# Patient Record
Sex: Female | Born: 1935 | Race: White | Hispanic: No | Marital: Married | Smoking: Former smoker
Health system: Southern US, Community
[De-identification: ages and names within clinical notes are randomized; demographics above are authoritative.]

## PROBLEM LIST (undated history)

## (undated) DIAGNOSIS — I890 Lymphedema, not elsewhere classified: Secondary | ICD-10-CM

## (undated) DIAGNOSIS — M1990 Unspecified osteoarthritis, unspecified site: Secondary | ICD-10-CM

## (undated) DIAGNOSIS — D649 Anemia, unspecified: Secondary | ICD-10-CM

## (undated) DIAGNOSIS — R32 Unspecified urinary incontinence: Secondary | ICD-10-CM

## (undated) DIAGNOSIS — I739 Peripheral vascular disease, unspecified: Secondary | ICD-10-CM

## (undated) DIAGNOSIS — K449 Diaphragmatic hernia without obstruction or gangrene: Secondary | ICD-10-CM

## (undated) DIAGNOSIS — T7840XA Allergy, unspecified, initial encounter: Secondary | ICD-10-CM

## (undated) DIAGNOSIS — E039 Hypothyroidism, unspecified: Secondary | ICD-10-CM

## (undated) DIAGNOSIS — I8390 Asymptomatic varicose veins of unspecified lower extremity: Secondary | ICD-10-CM

## (undated) DIAGNOSIS — L97809 Non-pressure chronic ulcer of other part of unspecified lower leg with unspecified severity: Secondary | ICD-10-CM

## (undated) DIAGNOSIS — N182 Chronic kidney disease, stage 2 (mild): Secondary | ICD-10-CM

## (undated) DIAGNOSIS — I10 Essential (primary) hypertension: Secondary | ICD-10-CM

## (undated) DIAGNOSIS — I872 Venous insufficiency (chronic) (peripheral): Secondary | ICD-10-CM

## (undated) DIAGNOSIS — M797 Fibromyalgia: Secondary | ICD-10-CM

## (undated) DIAGNOSIS — K219 Gastro-esophageal reflux disease without esophagitis: Secondary | ICD-10-CM

## (undated) HISTORY — DX: Chronic kidney disease, stage 2 (mild): N18.2

## (undated) HISTORY — PX: COLONOSCOPY: SHX174

## (undated) HISTORY — DX: Lymphedema, not elsewhere classified: I89.0

## (undated) HISTORY — PX: TONSILLECTOMY: SUR1361

## (undated) HISTORY — PX: MULTIPLE TOOTH EXTRACTIONS: SHX2053

## (undated) HISTORY — DX: Unspecified osteoarthritis, unspecified site: M19.90

## (undated) HISTORY — DX: Diaphragmatic hernia without obstruction or gangrene: K44.9

## (undated) HISTORY — DX: Essential (primary) hypertension: I10

## (undated) HISTORY — DX: Allergy, unspecified, initial encounter: T78.40XA

## (undated) HISTORY — DX: Anemia, unspecified: D64.9

## (undated) HISTORY — PX: CATARACT EXTRACTION W/ INTRAOCULAR LENS  IMPLANT, BILATERAL: SHX1307

## (undated) HISTORY — DX: Hypothyroidism, unspecified: E03.9

## (undated) HISTORY — PX: KNEE ARTHROSCOPY: SUR90

## (undated) HISTORY — DX: Asymptomatic varicose veins of unspecified lower extremity: I83.90

---

## 1999-11-16 ENCOUNTER — Other Ambulatory Visit: Admission: RE | Admit: 1999-11-16 | Discharge: 1999-11-16 | Payer: Self-pay | Admitting: Internal Medicine

## 2000-12-25 ENCOUNTER — Ambulatory Visit (HOSPITAL_COMMUNITY): Admission: RE | Admit: 2000-12-25 | Discharge: 2000-12-25 | Payer: Self-pay | Admitting: *Deleted

## 2001-01-16 ENCOUNTER — Other Ambulatory Visit: Admission: RE | Admit: 2001-01-16 | Discharge: 2001-01-16 | Payer: Self-pay | Admitting: Internal Medicine

## 2004-02-14 ENCOUNTER — Other Ambulatory Visit: Admission: RE | Admit: 2004-02-14 | Discharge: 2004-02-14 | Payer: Self-pay | Admitting: Internal Medicine

## 2005-05-20 ENCOUNTER — Encounter: Admission: RE | Admit: 2005-05-20 | Discharge: 2005-05-20 | Payer: Self-pay | Admitting: Internal Medicine

## 2006-06-23 ENCOUNTER — Ambulatory Visit (HOSPITAL_BASED_OUTPATIENT_CLINIC_OR_DEPARTMENT_OTHER): Admission: RE | Admit: 2006-06-23 | Discharge: 2006-06-23 | Payer: Self-pay | Admitting: Otolaryngology

## 2006-06-23 ENCOUNTER — Encounter (INDEPENDENT_AMBULATORY_CARE_PROVIDER_SITE_OTHER): Payer: Self-pay | Admitting: Specialist

## 2007-06-05 ENCOUNTER — Other Ambulatory Visit: Admission: RE | Admit: 2007-06-05 | Discharge: 2007-06-05 | Payer: Self-pay | Admitting: Internal Medicine

## 2007-09-22 ENCOUNTER — Encounter: Admission: RE | Admit: 2007-09-22 | Discharge: 2007-09-22 | Payer: Self-pay | Admitting: Internal Medicine

## 2008-07-11 ENCOUNTER — Encounter: Admission: RE | Admit: 2008-07-11 | Discharge: 2008-07-11 | Payer: Self-pay | Admitting: Internal Medicine

## 2008-07-21 ENCOUNTER — Other Ambulatory Visit: Admission: RE | Admit: 2008-07-21 | Discharge: 2008-07-21 | Payer: Self-pay | Admitting: Interventional Radiology

## 2008-07-21 ENCOUNTER — Encounter: Admission: RE | Admit: 2008-07-21 | Discharge: 2008-07-21 | Payer: Self-pay | Admitting: Internal Medicine

## 2008-07-21 ENCOUNTER — Encounter (INDEPENDENT_AMBULATORY_CARE_PROVIDER_SITE_OTHER): Payer: Self-pay | Admitting: Interventional Radiology

## 2009-06-21 ENCOUNTER — Encounter: Admission: RE | Admit: 2009-06-21 | Discharge: 2009-06-21 | Payer: Self-pay | Admitting: Endocrinology

## 2010-06-18 ENCOUNTER — Encounter: Admission: RE | Admit: 2010-06-18 | Discharge: 2010-06-18 | Payer: Self-pay | Admitting: Endocrinology

## 2011-04-05 NOTE — Op Note (Signed)
NAMEMARIT, Shelby                 ACCOUNT NO.:  [id number]   MEDICAL RECORD NO.:  [id number]          PATIENT TYPE:  AMB   LOCATION:  DSC                          FACILITY:  [facility]   PHYSICIAN:  Jefry H. Pollyann Kennedy, MD     DATE OF BIRTH:  September 14, 1935   DATE OF PROCEDURE:  06/23/2006  DATE OF DISCHARGE:                                 OPERATIVE REPORT   PREOPERATIVE DIAGNOSIS:  Chronic ethmoid maxillary sinusitis.   POSTOPERATIVE DIAGNOSIS:  Chronic ethmoid maxillary sinusitis.   PROCEDURE:  1. Bilateral endoscopic total ethmoidectomy.  2. Bilateral endoscopic maxillary antrostomy.   SURGEON:  Jefry H. Pollyann Kennedy, MD   General endotracheal anesthesia was used.  No complications.  Blood loss  minimal.   FINDINGS:  Diffuse hyperplastic mucosa throughout the ethmoid cells with  obstruction of the frontal recess and the middle meatus area obstructing the  maxillary sinus ostium bilaterally.  Some polypoid changes found within the  frontal recess area.  No complications.  Blood loss minimal.   REFERRING PHYSICIAN:  Dr. Merri Brunette.   HISTORY:  A 76 year old lady with a history of chronic and recurring ethmoid  maxillary sinus sinusitis.  Risks, benefits, alternatives, complications of  procedure explained to the patient's who seemed to understand and agreed to  surgery.   PROCEDURE:  The patient was taken to the operating room and placed on the  operating table in supine position.  Following induction of general  endotracheal anesthesia, the patient was prepped and draped in standard  fashion.  Oxymetazoline spray was used preoperatively in nasal cavities.  1%  Xylocaine with epinephrine was infiltrated into the superior and posterior  attachments of the middle turbinate and lateral nasal wall bilaterally.  Afrin soaked pledgets were used for several minutes for additional  vasoconstriction.   1 - Bilateral total endoscopic ethmoidectomy.  The sickle knife was used to  incise the base  of the uncinate process starting on left side.  Uncinectomy  was performed with straight Wilde forceps.  The suction was used to enter  the bulla and a complete ethmoid dissection was accomplished using the  microdebrider, removing all of hyperplastic mucosa.  All bony septations  laterally to the lamina papyracea, superiorly to the fovea and posteriorly  through the ground lamella to the face of the sphenoid.  Complete ethmoid  dissection was accomplished.  The frontal recess was dissected of polypoid  tissue as well.  1. Bilateral endoscopic maxillary antrostomy.  After the bulla was opened      on each side.  The 30 degrees endoscope and the curved suction was used      to enter into the fontanelle into the maxillary sinus.  The backbiting      forceps was used to enlarge the ostium anteriorly and through cut      forceps were used to enlarge it      posteriorly.  The sinus itself was in good shape without any polyps or      other abnormalities.  The pharynx was suctioned of blood and      secretions.  The ethmoid cavities were packed with Kyung Rudd packs      bilaterally.  The patient was awakened from anesthesia, extubated,      transferred to recovery in stable condition.      Jefry H. Pollyann Kennedy, MD  Electronically Signed     JHR/MEDQ  D:  06/23/2006  T:  06/23/2006  Job:  [id number]   cc:   Soyla Murphy. Renne Crigler, M.D.

## 2011-04-05 NOTE — Procedures (Signed)
[facility]  Patient:    Shelby Reese, Shelby Reese                          MRN: [id number] Proc. Date: 12/25/00 Adm. Date:  [id number] Attending:  Sabino Gasser                           Procedure Report  PROCEDURE:  Colonoscopy.  INDICATION FOR PROCEDURE:  Hemoccult positivity.  ANESTHESIA:  Demerol 100 mg, Versed 10 mg.  DESCRIPTION OF PROCEDURE:  With the patient mildly sedated in the left lateral decubitus position, a rectal exam was performed which revealed trace positive material. Subsequently, the Olympus videoscopic colonoscope was inserted in the rectum and passed through a very tortuous colon with pressure applied to the abdomen. The patient rolled into various positions. We were able to reach the cecum identified by the ileocecal valve and appendiceal orifice both of which were photographed. From this point, the colonoscope was slowly withdrawn taking circumferential views of the entire colonic mucosa, stopping only in anal canal which showed hemorrhoids both from this view and on retroflexed view. The colonoscope was then straightened and withdrawn. The patients vital signs and pulse oximeter remained stable. The patient tolerated the procedure well without apparent complications.  FINDINGS:  Very tortuous colon but unremarkable examination other than hemorrhoids.  PLAN:  Follow-up with me on an as needed basis. DD:  12/25/00 TD:  12/26/00 Job: [id number]

## 2011-11-20 DIAGNOSIS — J309 Allergic rhinitis, unspecified: Secondary | ICD-10-CM | POA: Diagnosis not present

## 2011-11-27 DIAGNOSIS — J309 Allergic rhinitis, unspecified: Secondary | ICD-10-CM | POA: Diagnosis not present

## 2011-11-28 DIAGNOSIS — J309 Allergic rhinitis, unspecified: Secondary | ICD-10-CM | POA: Diagnosis not present

## 2011-12-02 DIAGNOSIS — J309 Allergic rhinitis, unspecified: Secondary | ICD-10-CM | POA: Diagnosis not present

## 2011-12-10 DIAGNOSIS — J309 Allergic rhinitis, unspecified: Secondary | ICD-10-CM | POA: Diagnosis not present

## 2011-12-14 ENCOUNTER — Ambulatory Visit (INDEPENDENT_AMBULATORY_CARE_PROVIDER_SITE_OTHER): Payer: Medicare Other

## 2011-12-14 DIAGNOSIS — Z888 Allergy status to other drugs, medicaments and biological substances status: Secondary | ICD-10-CM

## 2011-12-23 DIAGNOSIS — J309 Allergic rhinitis, unspecified: Secondary | ICD-10-CM | POA: Diagnosis not present

## 2011-12-24 DIAGNOSIS — B351 Tinea unguium: Secondary | ICD-10-CM | POA: Diagnosis not present

## 2011-12-24 DIAGNOSIS — M79609 Pain in unspecified limb: Secondary | ICD-10-CM | POA: Diagnosis not present

## 2011-12-24 DIAGNOSIS — M2040 Other hammer toe(s) (acquired), unspecified foot: Secondary | ICD-10-CM | POA: Diagnosis not present

## 2011-12-24 DIAGNOSIS — M7150 Other bursitis, not elsewhere classified, unspecified site: Secondary | ICD-10-CM | POA: Diagnosis not present

## 2011-12-31 DIAGNOSIS — J309 Allergic rhinitis, unspecified: Secondary | ICD-10-CM | POA: Diagnosis not present

## 2012-01-06 DIAGNOSIS — N3020 Other chronic cystitis without hematuria: Secondary | ICD-10-CM | POA: Diagnosis not present

## 2012-01-06 DIAGNOSIS — N3941 Urge incontinence: Secondary | ICD-10-CM | POA: Diagnosis not present

## 2012-01-09 DIAGNOSIS — J309 Allergic rhinitis, unspecified: Secondary | ICD-10-CM | POA: Diagnosis not present

## 2012-01-16 DIAGNOSIS — J309 Allergic rhinitis, unspecified: Secondary | ICD-10-CM | POA: Diagnosis not present

## 2012-01-20 DIAGNOSIS — J309 Allergic rhinitis, unspecified: Secondary | ICD-10-CM | POA: Diagnosis not present

## 2012-01-20 DIAGNOSIS — Z1231 Encounter for screening mammogram for malignant neoplasm of breast: Secondary | ICD-10-CM | POA: Diagnosis not present

## 2012-01-21 DIAGNOSIS — N289 Disorder of kidney and ureter, unspecified: Secondary | ICD-10-CM | POA: Diagnosis not present

## 2012-01-21 DIAGNOSIS — I10 Essential (primary) hypertension: Secondary | ICD-10-CM | POA: Diagnosis not present

## 2012-01-21 DIAGNOSIS — E039 Hypothyroidism, unspecified: Secondary | ICD-10-CM | POA: Diagnosis not present

## 2012-01-21 DIAGNOSIS — N390 Urinary tract infection, site not specified: Secondary | ICD-10-CM | POA: Diagnosis not present

## 2012-01-28 DIAGNOSIS — R5383 Other fatigue: Secondary | ICD-10-CM | POA: Diagnosis not present

## 2012-01-28 DIAGNOSIS — Z8739 Personal history of other diseases of the musculoskeletal system and connective tissue: Secondary | ICD-10-CM | POA: Diagnosis not present

## 2012-01-28 DIAGNOSIS — I890 Lymphedema, not elsewhere classified: Secondary | ICD-10-CM | POA: Diagnosis not present

## 2012-01-28 DIAGNOSIS — E039 Hypothyroidism, unspecified: Secondary | ICD-10-CM | POA: Diagnosis not present

## 2012-01-28 DIAGNOSIS — R5381 Other malaise: Secondary | ICD-10-CM | POA: Diagnosis not present

## 2012-01-31 DIAGNOSIS — J309 Allergic rhinitis, unspecified: Secondary | ICD-10-CM | POA: Diagnosis not present

## 2012-02-04 DIAGNOSIS — J309 Allergic rhinitis, unspecified: Secondary | ICD-10-CM | POA: Diagnosis not present

## 2012-02-12 DIAGNOSIS — Z1212 Encounter for screening for malignant neoplasm of rectum: Secondary | ICD-10-CM | POA: Diagnosis not present

## 2012-02-19 DIAGNOSIS — J309 Allergic rhinitis, unspecified: Secondary | ICD-10-CM | POA: Diagnosis not present

## 2012-02-20 DIAGNOSIS — J309 Allergic rhinitis, unspecified: Secondary | ICD-10-CM | POA: Diagnosis not present

## 2012-02-26 DIAGNOSIS — J309 Allergic rhinitis, unspecified: Secondary | ICD-10-CM | POA: Diagnosis not present

## 2012-03-04 DIAGNOSIS — J309 Allergic rhinitis, unspecified: Secondary | ICD-10-CM | POA: Diagnosis not present

## 2012-03-05 DIAGNOSIS — E039 Hypothyroidism, unspecified: Secondary | ICD-10-CM | POA: Diagnosis not present

## 2012-03-10 DIAGNOSIS — E039 Hypothyroidism, unspecified: Secondary | ICD-10-CM | POA: Diagnosis not present

## 2012-03-11 DIAGNOSIS — J309 Allergic rhinitis, unspecified: Secondary | ICD-10-CM | POA: Diagnosis not present

## 2012-03-18 DIAGNOSIS — J309 Allergic rhinitis, unspecified: Secondary | ICD-10-CM | POA: Diagnosis not present

## 2012-03-24 DIAGNOSIS — J309 Allergic rhinitis, unspecified: Secondary | ICD-10-CM | POA: Diagnosis not present

## 2012-03-24 DIAGNOSIS — M79609 Pain in unspecified limb: Secondary | ICD-10-CM | POA: Diagnosis not present

## 2012-03-24 DIAGNOSIS — B351 Tinea unguium: Secondary | ICD-10-CM | POA: Diagnosis not present

## 2012-03-31 DIAGNOSIS — J309 Allergic rhinitis, unspecified: Secondary | ICD-10-CM | POA: Diagnosis not present

## 2012-04-07 DIAGNOSIS — N3020 Other chronic cystitis without hematuria: Secondary | ICD-10-CM | POA: Diagnosis not present

## 2012-04-07 DIAGNOSIS — N3941 Urge incontinence: Secondary | ICD-10-CM | POA: Diagnosis not present

## 2012-04-08 DIAGNOSIS — J309 Allergic rhinitis, unspecified: Secondary | ICD-10-CM | POA: Diagnosis not present

## 2012-04-15 DIAGNOSIS — J309 Allergic rhinitis, unspecified: Secondary | ICD-10-CM | POA: Diagnosis not present

## 2012-04-22 DIAGNOSIS — J309 Allergic rhinitis, unspecified: Secondary | ICD-10-CM | POA: Diagnosis not present

## 2012-04-29 DIAGNOSIS — J309 Allergic rhinitis, unspecified: Secondary | ICD-10-CM | POA: Diagnosis not present

## 2012-05-05 DIAGNOSIS — J309 Allergic rhinitis, unspecified: Secondary | ICD-10-CM | POA: Diagnosis not present

## 2012-05-05 DIAGNOSIS — E039 Hypothyroidism, unspecified: Secondary | ICD-10-CM | POA: Diagnosis not present

## 2012-05-08 DIAGNOSIS — J309 Allergic rhinitis, unspecified: Secondary | ICD-10-CM | POA: Diagnosis not present

## 2012-05-11 DIAGNOSIS — J309 Allergic rhinitis, unspecified: Secondary | ICD-10-CM | POA: Diagnosis not present

## 2012-05-11 DIAGNOSIS — E039 Hypothyroidism, unspecified: Secondary | ICD-10-CM | POA: Diagnosis not present

## 2012-05-14 DIAGNOSIS — J309 Allergic rhinitis, unspecified: Secondary | ICD-10-CM | POA: Diagnosis not present

## 2012-05-20 DIAGNOSIS — J309 Allergic rhinitis, unspecified: Secondary | ICD-10-CM | POA: Diagnosis not present

## 2012-05-27 DIAGNOSIS — J309 Allergic rhinitis, unspecified: Secondary | ICD-10-CM | POA: Diagnosis not present

## 2012-06-03 DIAGNOSIS — J309 Allergic rhinitis, unspecified: Secondary | ICD-10-CM | POA: Diagnosis not present

## 2012-06-10 DIAGNOSIS — J309 Allergic rhinitis, unspecified: Secondary | ICD-10-CM | POA: Diagnosis not present

## 2012-06-18 DIAGNOSIS — J309 Allergic rhinitis, unspecified: Secondary | ICD-10-CM | POA: Diagnosis not present

## 2012-06-25 DIAGNOSIS — J309 Allergic rhinitis, unspecified: Secondary | ICD-10-CM | POA: Diagnosis not present

## 2012-06-30 DIAGNOSIS — M79609 Pain in unspecified limb: Secondary | ICD-10-CM | POA: Diagnosis not present

## 2012-06-30 DIAGNOSIS — J309 Allergic rhinitis, unspecified: Secondary | ICD-10-CM | POA: Diagnosis not present

## 2012-06-30 DIAGNOSIS — B351 Tinea unguium: Secondary | ICD-10-CM | POA: Diagnosis not present

## 2012-07-08 DIAGNOSIS — J309 Allergic rhinitis, unspecified: Secondary | ICD-10-CM | POA: Diagnosis not present

## 2012-07-15 DIAGNOSIS — J309 Allergic rhinitis, unspecified: Secondary | ICD-10-CM | POA: Diagnosis not present

## 2012-07-22 DIAGNOSIS — J309 Allergic rhinitis, unspecified: Secondary | ICD-10-CM | POA: Diagnosis not present

## 2012-07-29 DIAGNOSIS — J309 Allergic rhinitis, unspecified: Secondary | ICD-10-CM | POA: Diagnosis not present

## 2012-07-31 DIAGNOSIS — J309 Allergic rhinitis, unspecified: Secondary | ICD-10-CM | POA: Diagnosis not present

## 2012-08-03 DIAGNOSIS — J309 Allergic rhinitis, unspecified: Secondary | ICD-10-CM | POA: Diagnosis not present

## 2012-08-04 DIAGNOSIS — J309 Allergic rhinitis, unspecified: Secondary | ICD-10-CM | POA: Diagnosis not present

## 2012-08-10 DIAGNOSIS — R05 Cough: Secondary | ICD-10-CM | POA: Diagnosis not present

## 2012-08-18 DIAGNOSIS — J309 Allergic rhinitis, unspecified: Secondary | ICD-10-CM | POA: Diagnosis not present

## 2012-08-20 DIAGNOSIS — Z961 Presence of intraocular lens: Secondary | ICD-10-CM | POA: Diagnosis not present

## 2012-08-27 DIAGNOSIS — J309 Allergic rhinitis, unspecified: Secondary | ICD-10-CM | POA: Diagnosis not present

## 2012-09-01 DIAGNOSIS — J309 Allergic rhinitis, unspecified: Secondary | ICD-10-CM | POA: Diagnosis not present

## 2012-09-03 DIAGNOSIS — Z23 Encounter for immunization: Secondary | ICD-10-CM | POA: Diagnosis not present

## 2012-09-10 DIAGNOSIS — J309 Allergic rhinitis, unspecified: Secondary | ICD-10-CM | POA: Diagnosis not present

## 2012-09-17 DIAGNOSIS — J309 Allergic rhinitis, unspecified: Secondary | ICD-10-CM | POA: Diagnosis not present

## 2012-09-21 DIAGNOSIS — J309 Allergic rhinitis, unspecified: Secondary | ICD-10-CM | POA: Diagnosis not present

## 2012-09-22 DIAGNOSIS — M79609 Pain in unspecified limb: Secondary | ICD-10-CM | POA: Diagnosis not present

## 2012-09-22 DIAGNOSIS — B351 Tinea unguium: Secondary | ICD-10-CM | POA: Diagnosis not present

## 2012-09-23 DIAGNOSIS — I83893 Varicose veins of bilateral lower extremities with other complications: Secondary | ICD-10-CM | POA: Diagnosis not present

## 2012-09-23 DIAGNOSIS — L97909 Non-pressure chronic ulcer of unspecified part of unspecified lower leg with unspecified severity: Secondary | ICD-10-CM | POA: Diagnosis not present

## 2012-09-25 DIAGNOSIS — M7989 Other specified soft tissue disorders: Secondary | ICD-10-CM | POA: Diagnosis not present

## 2012-09-30 DIAGNOSIS — J309 Allergic rhinitis, unspecified: Secondary | ICD-10-CM | POA: Diagnosis not present

## 2012-10-01 DIAGNOSIS — J309 Allergic rhinitis, unspecified: Secondary | ICD-10-CM | POA: Diagnosis not present

## 2012-10-02 DIAGNOSIS — J309 Allergic rhinitis, unspecified: Secondary | ICD-10-CM | POA: Diagnosis not present

## 2012-10-06 DIAGNOSIS — N3941 Urge incontinence: Secondary | ICD-10-CM | POA: Diagnosis not present

## 2012-10-07 DIAGNOSIS — J309 Allergic rhinitis, unspecified: Secondary | ICD-10-CM | POA: Diagnosis not present

## 2012-10-09 DIAGNOSIS — I872 Venous insufficiency (chronic) (peripheral): Secondary | ICD-10-CM | POA: Diagnosis not present

## 2012-10-12 DIAGNOSIS — J309 Allergic rhinitis, unspecified: Secondary | ICD-10-CM | POA: Diagnosis not present

## 2012-10-22 DIAGNOSIS — J309 Allergic rhinitis, unspecified: Secondary | ICD-10-CM | POA: Diagnosis not present

## 2012-10-28 DIAGNOSIS — M19049 Primary osteoarthritis, unspecified hand: Secondary | ICD-10-CM | POA: Diagnosis not present

## 2012-10-28 DIAGNOSIS — J309 Allergic rhinitis, unspecified: Secondary | ICD-10-CM | POA: Diagnosis not present

## 2012-11-05 DIAGNOSIS — J309 Allergic rhinitis, unspecified: Secondary | ICD-10-CM | POA: Diagnosis not present

## 2012-11-12 DIAGNOSIS — J309 Allergic rhinitis, unspecified: Secondary | ICD-10-CM | POA: Diagnosis not present

## 2012-11-19 ENCOUNTER — Other Ambulatory Visit: Payer: Self-pay | Admitting: Internal Medicine

## 2012-11-19 ENCOUNTER — Telehealth: Payer: Self-pay | Admitting: Emergency Medicine

## 2012-11-19 DIAGNOSIS — I872 Venous insufficiency (chronic) (peripheral): Secondary | ICD-10-CM

## 2012-11-19 NOTE — Telephone Encounter (Signed)
RECEIVED U/S REPORT FROM [facility] OFFICE.  THEY CAN NOT BURN U/S IMAGES TO A CD.  PT REFUSES TO HAVE ANOTHER US PERFORMED BECAUSE HER LEGS HURT TOO MUCH.  I TOLD HER THAT I WOULD CALL DR PHARR OFFICE TO MAKE THEM AWARE.   2:13- S/W SALLY AT DR [facility] OFFICE TO MAKE HER AWARE THAT PT REFUSES TO HAVE ANOTHER U/S ON HER LEGS. SHE WILL MAKE DR [facility] AWARE.

## 2012-11-24 DIAGNOSIS — J309 Allergic rhinitis, unspecified: Secondary | ICD-10-CM | POA: Diagnosis not present

## 2012-12-01 DIAGNOSIS — L98499 Non-pressure chronic ulcer of skin of other sites with unspecified severity: Secondary | ICD-10-CM | POA: Diagnosis not present

## 2012-12-01 DIAGNOSIS — R609 Edema, unspecified: Secondary | ICD-10-CM | POA: Diagnosis not present

## 2012-12-02 ENCOUNTER — Other Ambulatory Visit: Payer: Self-pay | Admitting: Internal Medicine

## 2012-12-02 DIAGNOSIS — I83819 Varicose veins of unspecified lower extremities with pain: Secondary | ICD-10-CM

## 2012-12-09 DIAGNOSIS — J309 Allergic rhinitis, unspecified: Secondary | ICD-10-CM | POA: Diagnosis not present

## 2012-12-15 DIAGNOSIS — B351 Tinea unguium: Secondary | ICD-10-CM | POA: Diagnosis not present

## 2012-12-15 DIAGNOSIS — M79609 Pain in unspecified limb: Secondary | ICD-10-CM | POA: Diagnosis not present

## 2012-12-16 ENCOUNTER — Inpatient Hospital Stay: Admission: RE | Admit: 2012-12-16 | Payer: Self-pay | Source: Ambulatory Visit

## 2012-12-16 ENCOUNTER — Other Ambulatory Visit: Payer: Self-pay

## 2012-12-17 DIAGNOSIS — J309 Allergic rhinitis, unspecified: Secondary | ICD-10-CM | POA: Diagnosis not present

## 2012-12-18 DIAGNOSIS — J309 Allergic rhinitis, unspecified: Secondary | ICD-10-CM | POA: Diagnosis not present

## 2012-12-24 DIAGNOSIS — J309 Allergic rhinitis, unspecified: Secondary | ICD-10-CM | POA: Diagnosis not present

## 2013-01-05 ENCOUNTER — Ambulatory Visit
Admission: RE | Admit: 2013-01-05 | Discharge: 2013-01-05 | Disposition: A | Discharge status: Home / Self Care | Payer: Medicare Other | Source: Ambulatory Visit | Attending: Internal Medicine | Admitting: Internal Medicine

## 2013-01-05 DIAGNOSIS — I83819 Varicose veins of unspecified lower extremities with pain: Secondary | ICD-10-CM

## 2013-01-05 DIAGNOSIS — I872 Venous insufficiency (chronic) (peripheral): Secondary | ICD-10-CM | POA: Diagnosis not present

## 2013-01-06 DIAGNOSIS — N3941 Urge incontinence: Secondary | ICD-10-CM | POA: Diagnosis not present

## 2013-01-06 DIAGNOSIS — J309 Allergic rhinitis, unspecified: Secondary | ICD-10-CM | POA: Diagnosis not present

## 2013-01-07 DIAGNOSIS — I890 Lymphedema, not elsewhere classified: Secondary | ICD-10-CM | POA: Diagnosis not present

## 2013-01-07 DIAGNOSIS — M1990 Unspecified osteoarthritis, unspecified site: Secondary | ICD-10-CM | POA: Diagnosis not present

## 2013-01-07 DIAGNOSIS — I83009 Varicose veins of unspecified lower extremity with ulcer of unspecified site: Secondary | ICD-10-CM | POA: Diagnosis not present

## 2013-01-11 DIAGNOSIS — E669 Obesity, unspecified: Secondary | ICD-10-CM | POA: Diagnosis not present

## 2013-01-11 DIAGNOSIS — I890 Lymphedema, not elsewhere classified: Secondary | ICD-10-CM | POA: Diagnosis not present

## 2013-01-11 DIAGNOSIS — I83009 Varicose veins of unspecified lower extremity with ulcer of unspecified site: Secondary | ICD-10-CM | POA: Diagnosis not present

## 2013-01-19 DIAGNOSIS — L97909 Non-pressure chronic ulcer of unspecified part of unspecified lower leg with unspecified severity: Secondary | ICD-10-CM | POA: Diagnosis not present

## 2013-01-19 DIAGNOSIS — L97309 Non-pressure chronic ulcer of unspecified ankle with unspecified severity: Secondary | ICD-10-CM | POA: Diagnosis not present

## 2013-01-19 DIAGNOSIS — I83009 Varicose veins of unspecified lower extremity with ulcer of unspecified site: Secondary | ICD-10-CM | POA: Diagnosis not present

## 2013-01-19 DIAGNOSIS — M1990 Unspecified osteoarthritis, unspecified site: Secondary | ICD-10-CM | POA: Diagnosis not present

## 2013-01-25 DIAGNOSIS — L97309 Non-pressure chronic ulcer of unspecified ankle with unspecified severity: Secondary | ICD-10-CM | POA: Diagnosis not present

## 2013-01-25 DIAGNOSIS — I10 Essential (primary) hypertension: Secondary | ICD-10-CM | POA: Diagnosis not present

## 2013-01-25 DIAGNOSIS — N3020 Other chronic cystitis without hematuria: Secondary | ICD-10-CM | POA: Diagnosis not present

## 2013-01-28 DIAGNOSIS — Z0000 Encounter for general adult medical examination without abnormal findings: Secondary | ICD-10-CM | POA: Diagnosis not present

## 2013-02-08 DIAGNOSIS — I890 Lymphedema, not elsewhere classified: Secondary | ICD-10-CM | POA: Diagnosis not present

## 2013-02-08 DIAGNOSIS — L97909 Non-pressure chronic ulcer of unspecified part of unspecified lower leg with unspecified severity: Secondary | ICD-10-CM | POA: Diagnosis not present

## 2013-02-08 DIAGNOSIS — L97309 Non-pressure chronic ulcer of unspecified ankle with unspecified severity: Secondary | ICD-10-CM | POA: Diagnosis not present

## 2013-02-08 DIAGNOSIS — I10 Essential (primary) hypertension: Secondary | ICD-10-CM | POA: Diagnosis not present

## 2013-02-08 DIAGNOSIS — M1990 Unspecified osteoarthritis, unspecified site: Secondary | ICD-10-CM | POA: Diagnosis not present

## 2013-02-08 DIAGNOSIS — E669 Obesity, unspecified: Secondary | ICD-10-CM | POA: Diagnosis not present

## 2013-02-11 DIAGNOSIS — J309 Allergic rhinitis, unspecified: Secondary | ICD-10-CM | POA: Diagnosis not present

## 2013-02-15 DIAGNOSIS — M1990 Unspecified osteoarthritis, unspecified site: Secondary | ICD-10-CM | POA: Diagnosis not present

## 2013-02-15 DIAGNOSIS — L97309 Non-pressure chronic ulcer of unspecified ankle with unspecified severity: Secondary | ICD-10-CM | POA: Diagnosis not present

## 2013-02-15 DIAGNOSIS — I890 Lymphedema, not elsewhere classified: Secondary | ICD-10-CM | POA: Diagnosis not present

## 2013-02-15 DIAGNOSIS — J309 Allergic rhinitis, unspecified: Secondary | ICD-10-CM | POA: Diagnosis not present

## 2013-02-15 DIAGNOSIS — L89109 Pressure ulcer of unspecified part of back, unspecified stage: Secondary | ICD-10-CM | POA: Diagnosis not present

## 2013-02-15 DIAGNOSIS — E669 Obesity, unspecified: Secondary | ICD-10-CM | POA: Diagnosis not present

## 2013-02-15 DIAGNOSIS — I10 Essential (primary) hypertension: Secondary | ICD-10-CM | POA: Diagnosis not present

## 2013-02-15 DIAGNOSIS — L97909 Non-pressure chronic ulcer of unspecified part of unspecified lower leg with unspecified severity: Secondary | ICD-10-CM | POA: Diagnosis not present

## 2013-02-15 DIAGNOSIS — I83009 Varicose veins of unspecified lower extremity with ulcer of unspecified site: Secondary | ICD-10-CM | POA: Diagnosis not present

## 2013-02-22 DIAGNOSIS — E669 Obesity, unspecified: Secondary | ICD-10-CM | POA: Diagnosis not present

## 2013-02-22 DIAGNOSIS — M1990 Unspecified osteoarthritis, unspecified site: Secondary | ICD-10-CM | POA: Diagnosis not present

## 2013-02-22 DIAGNOSIS — L97309 Non-pressure chronic ulcer of unspecified ankle with unspecified severity: Secondary | ICD-10-CM | POA: Diagnosis not present

## 2013-02-22 DIAGNOSIS — L97909 Non-pressure chronic ulcer of unspecified part of unspecified lower leg with unspecified severity: Secondary | ICD-10-CM | POA: Diagnosis not present

## 2013-02-22 DIAGNOSIS — Z7982 Long term (current) use of aspirin: Secondary | ICD-10-CM | POA: Diagnosis not present

## 2013-02-22 DIAGNOSIS — I10 Essential (primary) hypertension: Secondary | ICD-10-CM | POA: Diagnosis not present

## 2013-02-22 DIAGNOSIS — I890 Lymphedema, not elsewhere classified: Secondary | ICD-10-CM | POA: Diagnosis not present

## 2013-02-23 DIAGNOSIS — Z1212 Encounter for screening for malignant neoplasm of rectum: Secondary | ICD-10-CM | POA: Diagnosis not present

## 2013-03-01 DIAGNOSIS — L97309 Non-pressure chronic ulcer of unspecified ankle with unspecified severity: Secondary | ICD-10-CM | POA: Diagnosis not present

## 2013-03-01 DIAGNOSIS — I10 Essential (primary) hypertension: Secondary | ICD-10-CM | POA: Diagnosis not present

## 2013-03-01 DIAGNOSIS — I890 Lymphedema, not elsewhere classified: Secondary | ICD-10-CM | POA: Diagnosis not present

## 2013-03-01 DIAGNOSIS — E669 Obesity, unspecified: Secondary | ICD-10-CM | POA: Diagnosis not present

## 2013-03-01 DIAGNOSIS — M1990 Unspecified osteoarthritis, unspecified site: Secondary | ICD-10-CM | POA: Diagnosis not present

## 2013-03-01 DIAGNOSIS — I83009 Varicose veins of unspecified lower extremity with ulcer of unspecified site: Secondary | ICD-10-CM | POA: Diagnosis not present

## 2013-03-02 DIAGNOSIS — R350 Frequency of micturition: Secondary | ICD-10-CM | POA: Diagnosis not present

## 2013-03-02 DIAGNOSIS — M545 Low back pain: Secondary | ICD-10-CM | POA: Diagnosis not present

## 2013-03-02 DIAGNOSIS — N390 Urinary tract infection, site not specified: Secondary | ICD-10-CM | POA: Diagnosis not present

## 2013-03-02 DIAGNOSIS — N189 Chronic kidney disease, unspecified: Secondary | ICD-10-CM | POA: Diagnosis not present

## 2013-03-04 DIAGNOSIS — J309 Allergic rhinitis, unspecified: Secondary | ICD-10-CM | POA: Diagnosis not present

## 2013-03-08 DIAGNOSIS — L97909 Non-pressure chronic ulcer of unspecified part of unspecified lower leg with unspecified severity: Secondary | ICD-10-CM | POA: Diagnosis not present

## 2013-03-08 DIAGNOSIS — M1990 Unspecified osteoarthritis, unspecified site: Secondary | ICD-10-CM | POA: Diagnosis not present

## 2013-03-08 DIAGNOSIS — I10 Essential (primary) hypertension: Secondary | ICD-10-CM | POA: Diagnosis not present

## 2013-03-08 DIAGNOSIS — L97309 Non-pressure chronic ulcer of unspecified ankle with unspecified severity: Secondary | ICD-10-CM | POA: Diagnosis not present

## 2013-03-08 DIAGNOSIS — E669 Obesity, unspecified: Secondary | ICD-10-CM | POA: Diagnosis not present

## 2013-03-08 DIAGNOSIS — I890 Lymphedema, not elsewhere classified: Secondary | ICD-10-CM | POA: Diagnosis not present

## 2013-03-08 DIAGNOSIS — I83009 Varicose veins of unspecified lower extremity with ulcer of unspecified site: Secondary | ICD-10-CM | POA: Diagnosis not present

## 2013-03-09 DIAGNOSIS — B351 Tinea unguium: Secondary | ICD-10-CM | POA: Diagnosis not present

## 2013-03-09 DIAGNOSIS — M79609 Pain in unspecified limb: Secondary | ICD-10-CM | POA: Diagnosis not present

## 2013-03-15 DIAGNOSIS — J309 Allergic rhinitis, unspecified: Secondary | ICD-10-CM | POA: Diagnosis not present

## 2013-03-16 DIAGNOSIS — E669 Obesity, unspecified: Secondary | ICD-10-CM | POA: Diagnosis not present

## 2013-03-16 DIAGNOSIS — I10 Essential (primary) hypertension: Secondary | ICD-10-CM | POA: Diagnosis not present

## 2013-03-16 DIAGNOSIS — L97909 Non-pressure chronic ulcer of unspecified part of unspecified lower leg with unspecified severity: Secondary | ICD-10-CM | POA: Diagnosis not present

## 2013-03-16 DIAGNOSIS — I890 Lymphedema, not elsewhere classified: Secondary | ICD-10-CM | POA: Diagnosis not present

## 2013-03-16 DIAGNOSIS — L97309 Non-pressure chronic ulcer of unspecified ankle with unspecified severity: Secondary | ICD-10-CM | POA: Diagnosis not present

## 2013-03-16 DIAGNOSIS — M1990 Unspecified osteoarthritis, unspecified site: Secondary | ICD-10-CM | POA: Diagnosis not present

## 2013-03-16 DIAGNOSIS — I83009 Varicose veins of unspecified lower extremity with ulcer of unspecified site: Secondary | ICD-10-CM | POA: Diagnosis not present

## 2013-03-23 DIAGNOSIS — D649 Anemia, unspecified: Secondary | ICD-10-CM | POA: Diagnosis not present

## 2013-03-24 ENCOUNTER — Telehealth: Payer: Self-pay | Admitting: Oncology

## 2013-03-24 DIAGNOSIS — M1990 Unspecified osteoarthritis, unspecified site: Secondary | ICD-10-CM | POA: Diagnosis not present

## 2013-03-24 DIAGNOSIS — I890 Lymphedema, not elsewhere classified: Secondary | ICD-10-CM | POA: Diagnosis not present

## 2013-03-24 DIAGNOSIS — I10 Essential (primary) hypertension: Secondary | ICD-10-CM | POA: Diagnosis not present

## 2013-03-24 DIAGNOSIS — I83009 Varicose veins of unspecified lower extremity with ulcer of unspecified site: Secondary | ICD-10-CM | POA: Diagnosis not present

## 2013-03-24 DIAGNOSIS — Z7982 Long term (current) use of aspirin: Secondary | ICD-10-CM | POA: Diagnosis not present

## 2013-03-24 DIAGNOSIS — L97309 Non-pressure chronic ulcer of unspecified ankle with unspecified severity: Secondary | ICD-10-CM | POA: Diagnosis not present

## 2013-03-24 DIAGNOSIS — E669 Obesity, unspecified: Secondary | ICD-10-CM | POA: Diagnosis not present

## 2013-03-24 NOTE — Telephone Encounter (Signed)
LVOM FOR PT TO RETURN CALL IN RE TO NP APPT.  °

## 2013-03-25 DIAGNOSIS — J309 Allergic rhinitis, unspecified: Secondary | ICD-10-CM | POA: Diagnosis not present

## 2013-03-25 NOTE — Telephone Encounter (Signed)
S/W PT IN RE TO NP APPT 05/29 W/DR. HA REFERRING DR. PHARR DX- ANEMIA WELCOME PACKET MAILED.

## 2013-03-26 ENCOUNTER — Telehealth: Payer: Self-pay | Admitting: Oncology

## 2013-03-26 NOTE — Telephone Encounter (Signed)
C/D 03/26/13 for appt. 04/15/13

## 2013-03-29 DIAGNOSIS — E669 Obesity, unspecified: Secondary | ICD-10-CM | POA: Diagnosis not present

## 2013-03-29 DIAGNOSIS — L97209 Non-pressure chronic ulcer of unspecified calf with unspecified severity: Secondary | ICD-10-CM | POA: Diagnosis not present

## 2013-03-29 DIAGNOSIS — I83009 Varicose veins of unspecified lower extremity with ulcer of unspecified site: Secondary | ICD-10-CM | POA: Diagnosis not present

## 2013-03-29 DIAGNOSIS — L97909 Non-pressure chronic ulcer of unspecified part of unspecified lower leg with unspecified severity: Secondary | ICD-10-CM | POA: Diagnosis not present

## 2013-04-05 DIAGNOSIS — I83009 Varicose veins of unspecified lower extremity with ulcer of unspecified site: Secondary | ICD-10-CM | POA: Diagnosis not present

## 2013-04-05 DIAGNOSIS — L97209 Non-pressure chronic ulcer of unspecified calf with unspecified severity: Secondary | ICD-10-CM | POA: Diagnosis not present

## 2013-04-05 DIAGNOSIS — L97909 Non-pressure chronic ulcer of unspecified part of unspecified lower leg with unspecified severity: Secondary | ICD-10-CM | POA: Diagnosis not present

## 2013-04-05 DIAGNOSIS — E669 Obesity, unspecified: Secondary | ICD-10-CM | POA: Diagnosis not present

## 2013-04-07 DIAGNOSIS — J309 Allergic rhinitis, unspecified: Secondary | ICD-10-CM | POA: Diagnosis not present

## 2013-04-08 DIAGNOSIS — J309 Allergic rhinitis, unspecified: Secondary | ICD-10-CM | POA: Diagnosis not present

## 2013-04-15 ENCOUNTER — Ambulatory Visit (HOSPITAL_BASED_OUTPATIENT_CLINIC_OR_DEPARTMENT_OTHER): Payer: Medicare Other | Admitting: Oncology

## 2013-04-15 ENCOUNTER — Encounter: Payer: Self-pay | Admitting: Oncology

## 2013-04-15 ENCOUNTER — Telehealth: Payer: Self-pay | Admitting: Oncology

## 2013-04-15 ENCOUNTER — Other Ambulatory Visit (HOSPITAL_BASED_OUTPATIENT_CLINIC_OR_DEPARTMENT_OTHER): Payer: Medicare Other | Admitting: Lab

## 2013-04-15 ENCOUNTER — Ambulatory Visit: Payer: Medicare Other

## 2013-04-15 VITALS — BP 183/79 | HR 98 | Temp 97.1°F | Resp 19 | Ht 63.0 in | Wt 241.7 lb

## 2013-04-15 DIAGNOSIS — D539 Nutritional anemia, unspecified: Secondary | ICD-10-CM

## 2013-04-15 LAB — CBC WITH DIFFERENTIAL/PLATELET
Basophils Absolute: 0 10*3/uL (ref 0.0–0.1)
EOS%: 5.7 % (ref 0.0–7.0)
Eosinophils Absolute: 0.5 10*3/uL (ref 0.0–0.5)
HGB: 11.9 g/dL (ref 11.6–15.9)
MCV: 89.6 fL (ref 79.5–101.0)
RBC: 4.32 10*6/uL (ref 3.70–5.45)
RDW: 15.1 % — ABNORMAL HIGH (ref 11.2–14.5)
WBC: 9.5 10*3/uL (ref 3.9–10.3)

## 2013-04-15 LAB — COMPREHENSIVE METABOLIC PANEL (CC13)
ALT: 15 U/L (ref 0–55)
AST: 17 U/L (ref 5–34)
Alkaline Phosphatase: 73 U/L (ref 40–150)
Creatinine: 1.4 mg/dL — ABNORMAL HIGH (ref 0.6–1.1)
Potassium: 4.3 mEq/L (ref 3.5–5.1)
Sodium: 140 mEq/L (ref 136–145)

## 2013-04-15 LAB — MORPHOLOGY: PLT EST: ADEQUATE

## 2013-04-15 NOTE — Telephone Encounter (Signed)
gv and printed appt sched and avs for pt  °

## 2013-04-15 NOTE — Progress Notes (Signed)
No financial issues. I checked in new patient.

## 2013-04-15 NOTE — Patient Instructions (Addendum)
1.  Issue:  Anemia.  2.  Most likely anemia of chronic kidney disease. I need to rule out iron deficiency, Vit B12 deficiency, myeloma. 3.  I have low clinical suspicion at this time for MDS (myelodysplastic syndrome) due to very mild anemia.  If this were to be MDS, the treatment would be observation anyway due to lack of severe cytopenia. 4.  Recommendation:  Lab and return visit in about 8 months.

## 2013-04-16 NOTE — Progress Notes (Signed)
[facility]  Telephone:([phone]) [phone] [phone]     INITIAL HEMATOLOGY CONSULTATION    Referral MD:  Merri Brunette, M.D.  Reason for Referral: normocytic anemia.     HPI: Ms. 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 She recently developed normocytic anemia.  On 03/23/2013, her WBC was 10.4; Hgb 11.1; Plt 302.  She thought that she had slight anemia from internal hemorrhoids.  Her latest iron panel from 01/25/13 showed iron 39 ug/dL (ref [id number]), iron biding capacity 368 ug/dL (ref 829-562).  She was advised to start oral iron anyway.  Her last screening colonoscopy was more than 10 years ago; however, she refused to have another one due to poor experience with colonoscopy in the past.  She was kindly referred to the Cancer for evaluation.  Ms. Fantini presented to the Cancer Center today for the first time bu herself.  She reported mild diffuse muscle pain at the pressure points.  She though that she had fibromyalgia.  She had a few days of constipation in April 2014 and had hematochezia without melena, hematemesis, nausea/vomiting, abdominal pain.  She assumed that she had hemorrhoid.  She has not had hematochezia since then.  She is independent of all activities of daily living. She denied fever, anorexia, weight loss, fatigue, headache, visual changes, confusion, drenching night sweats, palpable lymph node swelling, mucositis, odynophagia, dysphagia, nausea vomiting, jaundice, chest pain, palpitation, shortness of breath, dyspnea on exertion, productive cough, gum bleeding, epistaxis, hematemesis, hemoptysis, abdominal pain, abdominal swelling, early satiety, melena, hematuria, skin rash, spontaneous bleeding, heat or cold intolerance, bowel bladder incontinence, back pain, focal motor weakness, paresthesia, depression.      Past Medical History  Diagnosis Date  . Hypothyroid   . Lymphedema     venous insufficency  .  Osteoarthritis   . Hypertension   . Allergy   . CKD (chronic kidney disease), stage II   . Hiatal hernia   :    Past Surgical History  Procedure Laterality Date  . Tonsillectomy    :   CURRENT MEDS: Current Outpatient Prescriptions  Medication Sig Dispense Refill  . acetaminophen (TYLENOL) 500 MG tablet Take 1,000 mg by mouth daily.      Marland Kitchen aspirin 81 MG tablet Take 81 mg by mouth daily.      . benazepril (LOTENSIN) 40 MG tablet Take 40 mg by mouth daily.      . Calcium Carbonate-Vitamin D (CALCIUM 600+D) 600-400 MG-UNIT per tablet Take 1 tablet by mouth daily.      . cetirizine (ZYRTEC) 10 MG tablet Take 10 mg by mouth daily.      Marland Kitchen diltiazem (TIAZAC) 120 MG 24 hr capsule Take 120 mg by mouth daily.      . diphenhydrAMINE (BENADRYL) 25 mg capsule Take 50 mg by mouth 2 (two) times daily. 50 mg am and HS,  25 mg in afternoon      . famotidine (PEPCID) 10 MG tablet Take 10 mg by mouth daily.      Marland Kitchen ibuprofen (ADVIL,MOTRIN) 200 MG tablet Take 400 mg by mouth daily.      Marland Kitchen MAGNESIUM CARBONATE PO Take 133 mg by mouth.      . Multiple Vitamin (MULTIVITAMIN) tablet Take 1 tablet by mouth daily.      Marland Kitchen NATURAL PSYLLIUM FIBER PO Take 1 capsule by mouth daily.      Marland Kitchen OVER THE COUNTER MEDICATION Take 1 tablet by mouth 2 (  two) times daily. Osteo Biflex      . OVER THE COUNTER MEDICATION Take 1 tablet by mouth daily. Magnesium 133 mg with 5 mg cheated zinc      . Oxybutynin 3 (28) % (MG/ACT) GEL Place onto the skin.      . pseudoephedrine (SUDAFED) 30 MG tablet Take 30 mg by mouth 2 (two) times daily.      Marland Kitchen SYNTHROID 175 MCG tablet Take 175 mcg by mouth daily.      . vitamin C (ASCORBIC ACID) 500 MG tablet Take 500 mg by mouth daily.       No current facility-administered medications for this visit.      Allergies  Allergen Reactions  . Bactrim (Sulfamethoxazole W-Trimethoprim) Diarrhea and Nausea Only  . Ciprofloxacin Other (See Comments)    tremors  . Diovan (Valsartan) Other (See  Comments)    Extreme vertigo  . Nitrofuran Derivatives Hives  . Penicillins Hives and Swelling  :  Family History  Problem Relation Age of Onset  . Stroke Mother   . Cancer Father     prostate  . Stroke Sister   . Heart disease Sister   . Stroke Maternal Grandmother   :  History   Social History  . Marital Status: Married    Spouse Name: N/A    Number of Children: 0  . Years of Education: N/A   Occupational History  .      retired Engineer, civil (consulting).    Social History Main Topics  . Smoking status: Former Smoker -- 0.50 packs/day for 10 years    Quit date: 11/19/1979  . Smokeless tobacco: Never Used  . Alcohol Use: 6.0 oz/week    10 Glasses of wine per week  . Drug Use: No  . Sexually Active: Not on file   Other Topics Concern  . Not on file   Social History Narrative  . No narrative on file  :  REVIEW OF SYSTEM:  The rest of the 14-point review of sytem was negative.   Exam: ECOG 0-1.   General:  well-nourished woman, in no acute distress.  Eyes:  no scleral icterus.  ENT:  There were no oropharyngeal lesions.  Neck was without thyromegaly.  Lymphatics:  Negative cervical, supraclavicular or axillary adenopathy.  Respiratory: lungs were clear bilaterally without wheezing or crackles.  Cardiovascular:  Regular rate and rhythm, S1/S2, without murmur, rub or gallop.  There was no pedal edema.  GI:  abdomen was soft, flat, nontender, nondistended, without organomegaly.  She deferred rectal exam since that had negative fecal occult cards with her PCP.  Muscoloskeletal:  no spinal tenderness of palpation of vertebral spine.  Skin exam was without echymosis, petichae.  Neuro exam was nonfocal.  Patient was able to get on and off exam table without assistance.  Gait was normal.  Patient was alert and oriented.  Attention was good.   Language was appropriate.  Mood was normal without depression.  Speech was not pressured.  Thought content was not tangential.    LABS:  Lab Results    Component Value Date   WBC 9.5 04/15/2013   HGB 11.9 04/15/2013   HCT 38.7 04/15/2013   PLT 289 04/15/2013   GLUCOSE 94 04/15/2013   ALT 15 04/15/2013   AST 17 04/15/2013   NA 140 04/15/2013   K 4.3 04/15/2013   CL 105 04/15/2013   CREATININE 1.4* 04/15/2013   BUN 28.2* 04/15/2013   CO2 25 04/15/2013  Blood smear review:   I personally reviewed the patient's peripheral blood smear today.  There was isocytosis.  There was no peripheral blast.  There was no schistocytosis, spherocytosis, target cell, rouleaux formation, tear drop cell.  There was no giant platelets or platelet clumps.     ASSESSMENT AND PLAN:   1.  Issue:  Anemia.  2.  Most likely anemia of chronic kidney disease. I need to rule out iron deficiency, Vit B12 deficiency, myeloma. 3.  I have low clinical suspicion at this time for MDS (myelodysplastic syndrome) due to very mild anemia.  If this were to be MDS, the treatment would be observation anyway due to lack of severe cytopenia.  4.  Recommendation:   -  Referral to GI for discussion of pros/cons of screening colonoscopy given recent history of hematochezia, anemia that improved with iron replacement.   -  No treatment is indicated at this time due to very mild anemia that has resolved.  -  Lab and return visit in about 8 months.    I informed Ms. Bacha that I am leaving the practice.  The [facility] will arrange for her to see another provider when she returns.    The length of time of the face-to-face encounter was . More than 50% of time was spent counseling and coordination of care.     Thank you for this referral.      '

## 2013-04-19 DIAGNOSIS — L97209 Non-pressure chronic ulcer of unspecified calf with unspecified severity: Secondary | ICD-10-CM | POA: Diagnosis not present

## 2013-04-19 DIAGNOSIS — I10 Essential (primary) hypertension: Secondary | ICD-10-CM | POA: Diagnosis not present

## 2013-04-19 DIAGNOSIS — Z7982 Long term (current) use of aspirin: Secondary | ICD-10-CM | POA: Diagnosis not present

## 2013-04-19 DIAGNOSIS — I890 Lymphedema, not elsewhere classified: Secondary | ICD-10-CM | POA: Diagnosis not present

## 2013-04-19 DIAGNOSIS — E669 Obesity, unspecified: Secondary | ICD-10-CM | POA: Diagnosis not present

## 2013-04-19 DIAGNOSIS — M1990 Unspecified osteoarthritis, unspecified site: Secondary | ICD-10-CM | POA: Diagnosis not present

## 2013-04-19 DIAGNOSIS — L97309 Non-pressure chronic ulcer of unspecified ankle with unspecified severity: Secondary | ICD-10-CM | POA: Diagnosis not present

## 2013-04-19 DIAGNOSIS — I83009 Varicose veins of unspecified lower extremity with ulcer of unspecified site: Secondary | ICD-10-CM | POA: Diagnosis not present

## 2013-04-19 LAB — PROTEIN ELECTROPHORESIS, SERUM, WITH REFLEX
Albumin ELP: 54.8 % — ABNORMAL LOW (ref 55.8–66.1)
Alpha-2-Globulin: 16.1 % — ABNORMAL HIGH (ref 7.1–11.8)
Beta 2: 4.3 % (ref 3.2–6.5)
Beta Globulin: 6.6 % (ref 4.7–7.2)
Gamma Globulin: 11.9 % (ref 11.1–18.8)
Total Protein, Serum Electrophoresis: 7.3 g/dL (ref 6.0–8.3)

## 2013-04-19 LAB — FERRITIN: Ferritin: 21 ng/mL (ref 10–291)

## 2013-04-19 LAB — VITAMIN B12: Vitamin B-12: 695 pg/mL (ref 211–911)

## 2013-04-20 ENCOUNTER — Telehealth: Payer: Self-pay | Admitting: *Deleted

## 2013-04-20 DIAGNOSIS — J309 Allergic rhinitis, unspecified: Secondary | ICD-10-CM | POA: Diagnosis not present

## 2013-04-20 NOTE — Telephone Encounter (Signed)
Pt calling for lab results from her visit 04/15/13.

## 2013-04-21 NOTE — Telephone Encounter (Signed)
I sent her this note in EPIC.  Please make sure that she signs in and check it.  "Dear Shelby Reese:  Extensive anemia work up did no show obvious causes.  There was no iron deficiency, myeloma, VitB12 deficiency.  I again recommend to keep monitoring your blood count.  In the future, if your anemia significantly worsens, we may consider further work up.   Please call us if questions.    Dr. Gaylyn Rong."

## 2013-04-22 ENCOUNTER — Telehealth: Payer: Self-pay | Admitting: *Deleted

## 2013-04-22 NOTE — Telephone Encounter (Signed)
Called pt w/ Dr. Lodema Pilot message (see previous phone note).  He said her lab results did not show any obvious causes for her anemia and to continue observation.  Pt states she can get into MyChart now and will look for his message.

## 2013-04-26 DIAGNOSIS — E669 Obesity, unspecified: Secondary | ICD-10-CM | POA: Diagnosis not present

## 2013-04-26 DIAGNOSIS — I83009 Varicose veins of unspecified lower extremity with ulcer of unspecified site: Secondary | ICD-10-CM | POA: Diagnosis not present

## 2013-04-26 DIAGNOSIS — L97909 Non-pressure chronic ulcer of unspecified part of unspecified lower leg with unspecified severity: Secondary | ICD-10-CM | POA: Diagnosis not present

## 2013-04-26 DIAGNOSIS — L97209 Non-pressure chronic ulcer of unspecified calf with unspecified severity: Secondary | ICD-10-CM | POA: Diagnosis not present

## 2013-05-03 DIAGNOSIS — L97909 Non-pressure chronic ulcer of unspecified part of unspecified lower leg with unspecified severity: Secondary | ICD-10-CM | POA: Diagnosis not present

## 2013-05-03 DIAGNOSIS — L97209 Non-pressure chronic ulcer of unspecified calf with unspecified severity: Secondary | ICD-10-CM | POA: Diagnosis not present

## 2013-05-03 DIAGNOSIS — E669 Obesity, unspecified: Secondary | ICD-10-CM | POA: Diagnosis not present

## 2013-05-03 DIAGNOSIS — I83009 Varicose veins of unspecified lower extremity with ulcer of unspecified site: Secondary | ICD-10-CM | POA: Diagnosis not present

## 2013-05-05 DIAGNOSIS — J309 Allergic rhinitis, unspecified: Secondary | ICD-10-CM | POA: Diagnosis not present

## 2013-05-10 DIAGNOSIS — I83009 Varicose veins of unspecified lower extremity with ulcer of unspecified site: Secondary | ICD-10-CM | POA: Diagnosis not present

## 2013-05-10 DIAGNOSIS — L97909 Non-pressure chronic ulcer of unspecified part of unspecified lower leg with unspecified severity: Secondary | ICD-10-CM | POA: Diagnosis not present

## 2013-05-10 DIAGNOSIS — E669 Obesity, unspecified: Secondary | ICD-10-CM | POA: Diagnosis not present

## 2013-05-10 DIAGNOSIS — I890 Lymphedema, not elsewhere classified: Secondary | ICD-10-CM | POA: Diagnosis not present

## 2013-05-10 DIAGNOSIS — L97209 Non-pressure chronic ulcer of unspecified calf with unspecified severity: Secondary | ICD-10-CM | POA: Diagnosis not present

## 2013-05-11 DIAGNOSIS — R351 Nocturia: Secondary | ICD-10-CM | POA: Diagnosis not present

## 2013-05-17 DIAGNOSIS — L97909 Non-pressure chronic ulcer of unspecified part of unspecified lower leg with unspecified severity: Secondary | ICD-10-CM | POA: Diagnosis not present

## 2013-05-17 DIAGNOSIS — L97209 Non-pressure chronic ulcer of unspecified calf with unspecified severity: Secondary | ICD-10-CM | POA: Diagnosis not present

## 2013-05-17 DIAGNOSIS — E669 Obesity, unspecified: Secondary | ICD-10-CM | POA: Diagnosis not present

## 2013-05-17 DIAGNOSIS — I83009 Varicose veins of unspecified lower extremity with ulcer of unspecified site: Secondary | ICD-10-CM | POA: Diagnosis not present

## 2013-05-18 DIAGNOSIS — J309 Allergic rhinitis, unspecified: Secondary | ICD-10-CM | POA: Diagnosis not present

## 2013-05-24 DIAGNOSIS — L97909 Non-pressure chronic ulcer of unspecified part of unspecified lower leg with unspecified severity: Secondary | ICD-10-CM | POA: Diagnosis not present

## 2013-05-24 DIAGNOSIS — Z7982 Long term (current) use of aspirin: Secondary | ICD-10-CM | POA: Diagnosis not present

## 2013-05-24 DIAGNOSIS — E669 Obesity, unspecified: Secondary | ICD-10-CM | POA: Diagnosis not present

## 2013-05-24 DIAGNOSIS — L97309 Non-pressure chronic ulcer of unspecified ankle with unspecified severity: Secondary | ICD-10-CM | POA: Diagnosis not present

## 2013-05-24 DIAGNOSIS — M1990 Unspecified osteoarthritis, unspecified site: Secondary | ICD-10-CM | POA: Diagnosis not present

## 2013-05-24 DIAGNOSIS — I10 Essential (primary) hypertension: Secondary | ICD-10-CM | POA: Diagnosis not present

## 2013-05-24 DIAGNOSIS — I83009 Varicose veins of unspecified lower extremity with ulcer of unspecified site: Secondary | ICD-10-CM | POA: Diagnosis not present

## 2013-05-24 DIAGNOSIS — I890 Lymphedema, not elsewhere classified: Secondary | ICD-10-CM | POA: Diagnosis not present

## 2013-06-01 DIAGNOSIS — M1990 Unspecified osteoarthritis, unspecified site: Secondary | ICD-10-CM | POA: Diagnosis not present

## 2013-06-01 DIAGNOSIS — L97309 Non-pressure chronic ulcer of unspecified ankle with unspecified severity: Secondary | ICD-10-CM | POA: Diagnosis not present

## 2013-06-01 DIAGNOSIS — I10 Essential (primary) hypertension: Secondary | ICD-10-CM | POA: Diagnosis not present

## 2013-06-01 DIAGNOSIS — I890 Lymphedema, not elsewhere classified: Secondary | ICD-10-CM | POA: Diagnosis not present

## 2013-06-01 DIAGNOSIS — I83009 Varicose veins of unspecified lower extremity with ulcer of unspecified site: Secondary | ICD-10-CM | POA: Diagnosis not present

## 2013-06-01 DIAGNOSIS — E669 Obesity, unspecified: Secondary | ICD-10-CM | POA: Diagnosis not present

## 2013-06-01 DIAGNOSIS — L97209 Non-pressure chronic ulcer of unspecified calf with unspecified severity: Secondary | ICD-10-CM | POA: Diagnosis not present

## 2013-06-02 DIAGNOSIS — J309 Allergic rhinitis, unspecified: Secondary | ICD-10-CM | POA: Diagnosis not present

## 2013-06-07 DIAGNOSIS — J309 Allergic rhinitis, unspecified: Secondary | ICD-10-CM | POA: Diagnosis not present

## 2013-06-14 DIAGNOSIS — L97909 Non-pressure chronic ulcer of unspecified part of unspecified lower leg with unspecified severity: Secondary | ICD-10-CM | POA: Diagnosis not present

## 2013-06-14 DIAGNOSIS — E669 Obesity, unspecified: Secondary | ICD-10-CM | POA: Diagnosis not present

## 2013-06-14 DIAGNOSIS — L97209 Non-pressure chronic ulcer of unspecified calf with unspecified severity: Secondary | ICD-10-CM | POA: Diagnosis not present

## 2013-06-14 DIAGNOSIS — I10 Essential (primary) hypertension: Secondary | ICD-10-CM | POA: Diagnosis not present

## 2013-06-14 DIAGNOSIS — M1990 Unspecified osteoarthritis, unspecified site: Secondary | ICD-10-CM | POA: Diagnosis not present

## 2013-06-14 DIAGNOSIS — L97309 Non-pressure chronic ulcer of unspecified ankle with unspecified severity: Secondary | ICD-10-CM | POA: Diagnosis not present

## 2013-06-14 DIAGNOSIS — I890 Lymphedema, not elsewhere classified: Secondary | ICD-10-CM | POA: Diagnosis not present

## 2013-06-15 DIAGNOSIS — J309 Allergic rhinitis, unspecified: Secondary | ICD-10-CM | POA: Diagnosis not present

## 2013-06-15 DIAGNOSIS — M79609 Pain in unspecified limb: Secondary | ICD-10-CM | POA: Diagnosis not present

## 2013-06-15 DIAGNOSIS — B351 Tinea unguium: Secondary | ICD-10-CM | POA: Diagnosis not present

## 2013-06-21 DIAGNOSIS — I83009 Varicose veins of unspecified lower extremity with ulcer of unspecified site: Secondary | ICD-10-CM | POA: Diagnosis not present

## 2013-06-21 DIAGNOSIS — L97209 Non-pressure chronic ulcer of unspecified calf with unspecified severity: Secondary | ICD-10-CM | POA: Diagnosis not present

## 2013-06-21 DIAGNOSIS — M1990 Unspecified osteoarthritis, unspecified site: Secondary | ICD-10-CM | POA: Diagnosis not present

## 2013-06-21 DIAGNOSIS — L97309 Non-pressure chronic ulcer of unspecified ankle with unspecified severity: Secondary | ICD-10-CM | POA: Diagnosis not present

## 2013-06-21 DIAGNOSIS — I10 Essential (primary) hypertension: Secondary | ICD-10-CM | POA: Diagnosis not present

## 2013-06-21 DIAGNOSIS — E669 Obesity, unspecified: Secondary | ICD-10-CM | POA: Diagnosis not present

## 2013-06-21 DIAGNOSIS — I890 Lymphedema, not elsewhere classified: Secondary | ICD-10-CM | POA: Diagnosis not present

## 2013-06-21 DIAGNOSIS — Z7982 Long term (current) use of aspirin: Secondary | ICD-10-CM | POA: Diagnosis not present

## 2013-06-23 ENCOUNTER — Other Ambulatory Visit: Payer: Self-pay

## 2013-06-23 DIAGNOSIS — J309 Allergic rhinitis, unspecified: Secondary | ICD-10-CM | POA: Diagnosis not present

## 2013-06-28 DIAGNOSIS — I10 Essential (primary) hypertension: Secondary | ICD-10-CM | POA: Diagnosis not present

## 2013-06-28 DIAGNOSIS — L97909 Non-pressure chronic ulcer of unspecified part of unspecified lower leg with unspecified severity: Secondary | ICD-10-CM | POA: Diagnosis not present

## 2013-06-28 DIAGNOSIS — M1990 Unspecified osteoarthritis, unspecified site: Secondary | ICD-10-CM | POA: Diagnosis not present

## 2013-06-28 DIAGNOSIS — I890 Lymphedema, not elsewhere classified: Secondary | ICD-10-CM | POA: Diagnosis not present

## 2013-06-28 DIAGNOSIS — E669 Obesity, unspecified: Secondary | ICD-10-CM | POA: Diagnosis not present

## 2013-06-28 DIAGNOSIS — L97309 Non-pressure chronic ulcer of unspecified ankle with unspecified severity: Secondary | ICD-10-CM | POA: Diagnosis not present

## 2013-06-28 DIAGNOSIS — I83009 Varicose veins of unspecified lower extremity with ulcer of unspecified site: Secondary | ICD-10-CM | POA: Diagnosis not present

## 2013-06-30 DIAGNOSIS — J309 Allergic rhinitis, unspecified: Secondary | ICD-10-CM | POA: Diagnosis not present

## 2013-07-05 DIAGNOSIS — I83009 Varicose veins of unspecified lower extremity with ulcer of unspecified site: Secondary | ICD-10-CM | POA: Diagnosis not present

## 2013-07-05 DIAGNOSIS — E669 Obesity, unspecified: Secondary | ICD-10-CM | POA: Diagnosis not present

## 2013-07-05 DIAGNOSIS — L97309 Non-pressure chronic ulcer of unspecified ankle with unspecified severity: Secondary | ICD-10-CM | POA: Diagnosis not present

## 2013-07-05 DIAGNOSIS — I890 Lymphedema, not elsewhere classified: Secondary | ICD-10-CM | POA: Diagnosis not present

## 2013-07-05 DIAGNOSIS — I10 Essential (primary) hypertension: Secondary | ICD-10-CM | POA: Diagnosis not present

## 2013-07-05 DIAGNOSIS — M1990 Unspecified osteoarthritis, unspecified site: Secondary | ICD-10-CM | POA: Diagnosis not present

## 2013-07-05 DIAGNOSIS — L97909 Non-pressure chronic ulcer of unspecified part of unspecified lower leg with unspecified severity: Secondary | ICD-10-CM | POA: Diagnosis not present

## 2013-07-12 DIAGNOSIS — E669 Obesity, unspecified: Secondary | ICD-10-CM | POA: Diagnosis not present

## 2013-07-12 DIAGNOSIS — M1990 Unspecified osteoarthritis, unspecified site: Secondary | ICD-10-CM | POA: Diagnosis not present

## 2013-07-12 DIAGNOSIS — L97909 Non-pressure chronic ulcer of unspecified part of unspecified lower leg with unspecified severity: Secondary | ICD-10-CM | POA: Diagnosis not present

## 2013-07-12 DIAGNOSIS — I890 Lymphedema, not elsewhere classified: Secondary | ICD-10-CM | POA: Diagnosis not present

## 2013-07-12 DIAGNOSIS — I83009 Varicose veins of unspecified lower extremity with ulcer of unspecified site: Secondary | ICD-10-CM | POA: Diagnosis not present

## 2013-07-12 DIAGNOSIS — L97309 Non-pressure chronic ulcer of unspecified ankle with unspecified severity: Secondary | ICD-10-CM | POA: Diagnosis not present

## 2013-07-12 DIAGNOSIS — I10 Essential (primary) hypertension: Secondary | ICD-10-CM | POA: Diagnosis not present

## 2013-07-15 DIAGNOSIS — J309 Allergic rhinitis, unspecified: Secondary | ICD-10-CM | POA: Diagnosis not present

## 2013-07-20 DIAGNOSIS — L97309 Non-pressure chronic ulcer of unspecified ankle with unspecified severity: Secondary | ICD-10-CM | POA: Diagnosis not present

## 2013-07-20 DIAGNOSIS — E669 Obesity, unspecified: Secondary | ICD-10-CM | POA: Diagnosis not present

## 2013-07-20 DIAGNOSIS — L97909 Non-pressure chronic ulcer of unspecified part of unspecified lower leg with unspecified severity: Secondary | ICD-10-CM | POA: Diagnosis not present

## 2013-07-20 DIAGNOSIS — I890 Lymphedema, not elsewhere classified: Secondary | ICD-10-CM | POA: Diagnosis not present

## 2013-07-20 DIAGNOSIS — I10 Essential (primary) hypertension: Secondary | ICD-10-CM | POA: Diagnosis not present

## 2013-07-20 DIAGNOSIS — M1990 Unspecified osteoarthritis, unspecified site: Secondary | ICD-10-CM | POA: Diagnosis not present

## 2013-07-20 DIAGNOSIS — I83009 Varicose veins of unspecified lower extremity with ulcer of unspecified site: Secondary | ICD-10-CM | POA: Diagnosis not present

## 2013-07-20 DIAGNOSIS — Z7982 Long term (current) use of aspirin: Secondary | ICD-10-CM | POA: Diagnosis not present

## 2013-07-28 DIAGNOSIS — J309 Allergic rhinitis, unspecified: Secondary | ICD-10-CM | POA: Diagnosis not present

## 2013-08-05 DIAGNOSIS — R062 Wheezing: Secondary | ICD-10-CM | POA: Diagnosis not present

## 2013-08-05 DIAGNOSIS — J309 Allergic rhinitis, unspecified: Secondary | ICD-10-CM | POA: Diagnosis not present

## 2013-08-06 DIAGNOSIS — J309 Allergic rhinitis, unspecified: Secondary | ICD-10-CM | POA: Diagnosis not present

## 2013-08-06 DIAGNOSIS — Z23 Encounter for immunization: Secondary | ICD-10-CM | POA: Diagnosis not present

## 2013-08-06 DIAGNOSIS — R062 Wheezing: Secondary | ICD-10-CM | POA: Diagnosis not present

## 2013-08-09 DIAGNOSIS — R062 Wheezing: Secondary | ICD-10-CM | POA: Diagnosis not present

## 2013-08-09 DIAGNOSIS — J309 Allergic rhinitis, unspecified: Secondary | ICD-10-CM | POA: Diagnosis not present

## 2013-08-16 ENCOUNTER — Telehealth (HOSPITAL_COMMUNITY): Payer: Self-pay

## 2013-08-16 NOTE — Telephone Encounter (Signed)
Spoke with Shelby Reese regarding upcoming appointment for venous ultrasound.  She questioned whether or not the ultrasound would be like her previous exams which involved "squeezing" the legs.  I informed her that the exam would be the same to check the valves.  She said the previous ultrasound exams were very painful and that she would need to take "strong narcotics" prior to the appointment.  She also stated that she had a driver to get her to the appointment and assist her into the exam room.

## 2013-08-26 DIAGNOSIS — J309 Allergic rhinitis, unspecified: Secondary | ICD-10-CM | POA: Diagnosis not present

## 2013-08-26 DIAGNOSIS — Z961 Presence of intraocular lens: Secondary | ICD-10-CM | POA: Diagnosis not present

## 2013-08-31 ENCOUNTER — Other Ambulatory Visit: Payer: Self-pay | Admitting: Surgery

## 2013-08-31 DIAGNOSIS — I83893 Varicose veins of bilateral lower extremities with other complications: Secondary | ICD-10-CM

## 2013-09-07 DIAGNOSIS — J309 Allergic rhinitis, unspecified: Secondary | ICD-10-CM | POA: Diagnosis not present

## 2013-09-14 ENCOUNTER — Ambulatory Visit: Payer: PRIVATE HEALTH INSURANCE | Admitting: Podiatry

## 2013-09-17 ENCOUNTER — Encounter: Payer: Self-pay | Admitting: Surgery

## 2013-09-20 ENCOUNTER — Encounter: Payer: Self-pay | Admitting: Surgery

## 2013-09-20 ENCOUNTER — Ambulatory Visit (HOSPITAL_COMMUNITY)
Admission: RE | Admit: 2013-09-20 | Discharge: 2013-09-20 | Disposition: A | Discharge status: Home / Self Care | Payer: Medicare Other | Source: Ambulatory Visit | Attending: Surgery | Admitting: Surgery

## 2013-09-20 ENCOUNTER — Ambulatory Visit (INDEPENDENT_AMBULATORY_CARE_PROVIDER_SITE_OTHER): Payer: Medicare Other | Admitting: Surgery

## 2013-09-20 VITALS — BP 172/52 | HR 96 | Resp 18 | Ht 65.0 in | Wt 242.0 lb

## 2013-09-20 DIAGNOSIS — I83893 Varicose veins of bilateral lower extremities with other complications: Secondary | ICD-10-CM | POA: Diagnosis not present
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
## 2013-09-22 DIAGNOSIS — J309 Allergic rhinitis, unspecified: Secondary | ICD-10-CM | POA: Diagnosis not present

## 2013-09-23 ENCOUNTER — Other Ambulatory Visit: Payer: Self-pay

## 2013-09-28 ENCOUNTER — Ambulatory Visit (INDEPENDENT_AMBULATORY_CARE_PROVIDER_SITE_OTHER): Payer: Medicare Other | Admitting: Podiatry

## 2013-09-28 ENCOUNTER — Encounter: Payer: Self-pay | Admitting: Podiatry

## 2013-09-28 VITALS — BP 151/64 | HR 91 | Resp 16

## 2013-09-28 DIAGNOSIS — B351 Tinea unguium: Secondary | ICD-10-CM | POA: Diagnosis not present

## 2013-09-28 DIAGNOSIS — M79609 Pain in unspecified limb: Secondary | ICD-10-CM

## 2013-09-28 NOTE — Progress Notes (Signed)
Shelby Reese presents today with a chief complaint of painful toenails bilateral.  Objective: Pulses are palpable bilateral vital signs are stable she is alert and oriented x3. Nails are thick yellow dystrophic onychomycotic and painful on palpation.  Assessment: Pain in limb second onychomycosis.  Plan: Debridement of nails in thickness and length as a covered service sector to pain.

## 2013-09-29 DIAGNOSIS — J309 Allergic rhinitis, unspecified: Secondary | ICD-10-CM | POA: Diagnosis not present

## 2013-10-06 DIAGNOSIS — J309 Allergic rhinitis, unspecified: Secondary | ICD-10-CM | POA: Diagnosis not present

## 2013-10-12 DIAGNOSIS — J309 Allergic rhinitis, unspecified: Secondary | ICD-10-CM | POA: Diagnosis not present

## 2013-10-19 DIAGNOSIS — J309 Allergic rhinitis, unspecified: Secondary | ICD-10-CM | POA: Diagnosis not present

## 2013-11-01 DIAGNOSIS — J309 Allergic rhinitis, unspecified: Secondary | ICD-10-CM | POA: Diagnosis not present

## 2013-11-16 DIAGNOSIS — R351 Nocturia: Secondary | ICD-10-CM | POA: Diagnosis not present

## 2013-11-16 DIAGNOSIS — N3020 Other chronic cystitis without hematuria: Secondary | ICD-10-CM | POA: Diagnosis not present

## 2013-11-16 DIAGNOSIS — N3941 Urge incontinence: Secondary | ICD-10-CM | POA: Diagnosis not present

## 2013-11-16 DIAGNOSIS — J309 Allergic rhinitis, unspecified: Secondary | ICD-10-CM | POA: Diagnosis not present

## 2013-11-16 DIAGNOSIS — N3000 Acute cystitis without hematuria: Secondary | ICD-10-CM | POA: Diagnosis not present

## 2013-11-30 DIAGNOSIS — J309 Allergic rhinitis, unspecified: Secondary | ICD-10-CM | POA: Diagnosis not present

## 2013-11-30 DIAGNOSIS — N3020 Other chronic cystitis without hematuria: Secondary | ICD-10-CM | POA: Diagnosis not present

## 2013-12-14 DIAGNOSIS — J309 Allergic rhinitis, unspecified: Secondary | ICD-10-CM | POA: Diagnosis not present

## 2013-12-15 ENCOUNTER — Other Ambulatory Visit: Payer: Self-pay | Admitting: Hematology and Oncology

## 2013-12-15 ENCOUNTER — Telehealth: Payer: Self-pay | Admitting: *Deleted

## 2013-12-15 DIAGNOSIS — J309 Allergic rhinitis, unspecified: Secondary | ICD-10-CM | POA: Diagnosis not present

## 2013-12-15 NOTE — Telephone Encounter (Signed)
Informed pt that Dr. Alvy Bimler reviewed her chart and states no reason/need for pt to f/u w/ Hematologist at this time.  States PCP can monitor pt's blood work and call us if things change in future.  Pt agreed and verbalized understanding will cancel her appts here tomorrow.  Instructed her to call if any needs in future.

## 2013-12-16 ENCOUNTER — Ambulatory Visit: Payer: Medicare Other

## 2013-12-16 ENCOUNTER — Other Ambulatory Visit: Payer: Medicare Other

## 2013-12-16 ENCOUNTER — Ambulatory Visit: Payer: Medicare Other | Admitting: Hematology and Oncology

## 2013-12-16 ENCOUNTER — Other Ambulatory Visit: Payer: Medicare Other | Admitting: Lab

## 2013-12-16 DIAGNOSIS — J309 Allergic rhinitis, unspecified: Secondary | ICD-10-CM | POA: Diagnosis not present

## 2013-12-28 ENCOUNTER — Ambulatory Visit (INDEPENDENT_AMBULATORY_CARE_PROVIDER_SITE_OTHER): Payer: Medicare Other | Admitting: Podiatry

## 2013-12-28 ENCOUNTER — Encounter: Payer: Self-pay | Admitting: Podiatry

## 2013-12-28 VITALS — BP 146/78 | HR 94 | Resp 20

## 2013-12-28 DIAGNOSIS — M79609 Pain in unspecified limb: Secondary | ICD-10-CM

## 2013-12-28 DIAGNOSIS — B351 Tinea unguium: Secondary | ICD-10-CM

## 2013-12-28 DIAGNOSIS — J309 Allergic rhinitis, unspecified: Secondary | ICD-10-CM | POA: Diagnosis not present

## 2013-12-28 NOTE — Progress Notes (Signed)
Just trim the toenails because they're painful when I wear shoes.  Objective: Vital signs are stable she is alert and oriented x3. Nails are thick yellow dystrophic lytic mycotic and painful palpation as well as debridement.  Assessment: Pain in limb secondary to onychomycosis 1 through 5 bilateral.  Plan: Debridement of nails 1 through 5 bilateral.

## 2014-01-17 DIAGNOSIS — J309 Allergic rhinitis, unspecified: Secondary | ICD-10-CM | POA: Diagnosis not present

## 2014-01-25 DIAGNOSIS — N390 Urinary tract infection, site not specified: Secondary | ICD-10-CM | POA: Diagnosis not present

## 2014-01-25 DIAGNOSIS — D649 Anemia, unspecified: Secondary | ICD-10-CM | POA: Diagnosis not present

## 2014-01-25 DIAGNOSIS — I10 Essential (primary) hypertension: Secondary | ICD-10-CM | POA: Diagnosis not present

## 2014-01-25 DIAGNOSIS — E7800 Pure hypercholesterolemia, unspecified: Secondary | ICD-10-CM | POA: Diagnosis not present

## 2014-01-25 DIAGNOSIS — E039 Hypothyroidism, unspecified: Secondary | ICD-10-CM | POA: Diagnosis not present

## 2014-01-27 DIAGNOSIS — E7800 Pure hypercholesterolemia, unspecified: Secondary | ICD-10-CM | POA: Diagnosis not present

## 2014-01-27 DIAGNOSIS — I10 Essential (primary) hypertension: Secondary | ICD-10-CM | POA: Diagnosis not present

## 2014-01-27 DIAGNOSIS — IMO0001 Reserved for inherently not codable concepts without codable children: Secondary | ICD-10-CM | POA: Diagnosis not present

## 2014-01-27 DIAGNOSIS — Z0000 Encounter for general adult medical examination without abnormal findings: Secondary | ICD-10-CM | POA: Diagnosis not present

## 2014-01-27 DIAGNOSIS — Z7982 Long term (current) use of aspirin: Secondary | ICD-10-CM | POA: Diagnosis not present

## 2014-01-31 DIAGNOSIS — I890 Lymphedema, not elsewhere classified: Secondary | ICD-10-CM | POA: Diagnosis not present

## 2014-01-31 DIAGNOSIS — E039 Hypothyroidism, unspecified: Secondary | ICD-10-CM | POA: Diagnosis not present

## 2014-01-31 DIAGNOSIS — I779 Disorder of arteries and arterioles, unspecified: Secondary | ICD-10-CM | POA: Diagnosis not present

## 2014-01-31 DIAGNOSIS — E7800 Pure hypercholesterolemia, unspecified: Secondary | ICD-10-CM | POA: Diagnosis not present

## 2014-01-31 DIAGNOSIS — R350 Frequency of micturition: Secondary | ICD-10-CM | POA: Diagnosis not present

## 2014-02-01 DIAGNOSIS — Z1231 Encounter for screening mammogram for malignant neoplasm of breast: Secondary | ICD-10-CM | POA: Diagnosis not present

## 2014-02-01 DIAGNOSIS — J309 Allergic rhinitis, unspecified: Secondary | ICD-10-CM | POA: Diagnosis not present

## 2014-02-08 DIAGNOSIS — J309 Allergic rhinitis, unspecified: Secondary | ICD-10-CM | POA: Diagnosis not present

## 2014-02-16 DIAGNOSIS — J309 Allergic rhinitis, unspecified: Secondary | ICD-10-CM | POA: Diagnosis not present

## 2014-02-21 DIAGNOSIS — Z1212 Encounter for screening for malignant neoplasm of rectum: Secondary | ICD-10-CM | POA: Diagnosis not present

## 2014-02-23 DIAGNOSIS — J309 Allergic rhinitis, unspecified: Secondary | ICD-10-CM | POA: Diagnosis not present

## 2014-03-02 DIAGNOSIS — J309 Allergic rhinitis, unspecified: Secondary | ICD-10-CM | POA: Diagnosis not present

## 2014-03-15 ENCOUNTER — Ambulatory Visit: Payer: Medicare Other | Admitting: Podiatry

## 2014-03-15 ENCOUNTER — Encounter: Payer: Self-pay | Admitting: Podiatry

## 2014-03-15 ENCOUNTER — Ambulatory Visit (INDEPENDENT_AMBULATORY_CARE_PROVIDER_SITE_OTHER): Payer: Medicare Other | Admitting: Podiatry

## 2014-03-15 VITALS — BP 156/71 | HR 94 | Resp 16

## 2014-03-15 DIAGNOSIS — M79609 Pain in unspecified limb: Secondary | ICD-10-CM | POA: Diagnosis not present

## 2014-03-15 DIAGNOSIS — J309 Allergic rhinitis, unspecified: Secondary | ICD-10-CM | POA: Diagnosis not present

## 2014-03-15 DIAGNOSIS — B351 Tinea unguium: Secondary | ICD-10-CM | POA: Diagnosis not present

## 2014-03-15 NOTE — Progress Notes (Signed)
She presents today for routine nail debridement painful elongated toenails one through 5 bilateral.  Objective: Vital signs are stable she is alert and oriented x3 pulses are palpable bilateral. Nails are thick yellow dystrophic with mycotic painful elongated and sharply incurvated nails.  Assessment: Pain in limb secondary to onychomycosis 1 through 5 bilateral.  Plan: Debridement nails 1 through 5 bilateral covered service secondary to pain.

## 2014-03-31 DIAGNOSIS — J309 Allergic rhinitis, unspecified: Secondary | ICD-10-CM | POA: Diagnosis not present

## 2014-04-06 DIAGNOSIS — J309 Allergic rhinitis, unspecified: Secondary | ICD-10-CM | POA: Diagnosis not present

## 2014-04-13 DIAGNOSIS — J309 Allergic rhinitis, unspecified: Secondary | ICD-10-CM | POA: Diagnosis not present

## 2014-04-27 DIAGNOSIS — J309 Allergic rhinitis, unspecified: Secondary | ICD-10-CM | POA: Diagnosis not present

## 2014-05-09 DIAGNOSIS — J309 Allergic rhinitis, unspecified: Secondary | ICD-10-CM | POA: Diagnosis not present

## 2014-05-24 DIAGNOSIS — J309 Allergic rhinitis, unspecified: Secondary | ICD-10-CM | POA: Diagnosis not present

## 2014-05-31 DIAGNOSIS — J309 Allergic rhinitis, unspecified: Secondary | ICD-10-CM | POA: Diagnosis not present

## 2014-06-07 DIAGNOSIS — J309 Allergic rhinitis, unspecified: Secondary | ICD-10-CM | POA: Diagnosis not present

## 2014-06-14 DIAGNOSIS — J309 Allergic rhinitis, unspecified: Secondary | ICD-10-CM | POA: Diagnosis not present

## 2014-06-21 ENCOUNTER — Ambulatory Visit (INDEPENDENT_AMBULATORY_CARE_PROVIDER_SITE_OTHER): Payer: Medicare Other | Admitting: Podiatry

## 2014-06-21 ENCOUNTER — Encounter: Payer: Self-pay | Admitting: Podiatry

## 2014-06-21 DIAGNOSIS — M79609 Pain in unspecified limb: Secondary | ICD-10-CM | POA: Diagnosis not present

## 2014-06-21 DIAGNOSIS — J309 Allergic rhinitis, unspecified: Secondary | ICD-10-CM | POA: Diagnosis not present

## 2014-06-21 DIAGNOSIS — M79676 Pain in unspecified toe(s): Secondary | ICD-10-CM

## 2014-06-21 DIAGNOSIS — B351 Tinea unguium: Secondary | ICD-10-CM | POA: Diagnosis not present

## 2014-06-21 NOTE — Progress Notes (Signed)
She presents today with a chief complaint of painful elongated toenails one through 5 bilateral.  Objective: Nails are thick yellow dystrophic onychomycotic and painful palpation.  Assessment: Pain in limb secondary to onychomycosis 1 through 5 bilateral.  Plan: Debridement of nails 1 through 5 bilateral covered service secondary to pain. 

## 2014-07-05 DIAGNOSIS — J309 Allergic rhinitis, unspecified: Secondary | ICD-10-CM | POA: Diagnosis not present

## 2014-07-21 DIAGNOSIS — J309 Allergic rhinitis, unspecified: Secondary | ICD-10-CM | POA: Diagnosis not present

## 2014-08-03 DIAGNOSIS — J309 Allergic rhinitis, unspecified: Secondary | ICD-10-CM | POA: Diagnosis not present

## 2014-08-04 DIAGNOSIS — J309 Allergic rhinitis, unspecified: Secondary | ICD-10-CM | POA: Diagnosis not present

## 2014-08-04 DIAGNOSIS — R0602 Shortness of breath: Secondary | ICD-10-CM | POA: Diagnosis not present

## 2014-08-09 DIAGNOSIS — I10 Essential (primary) hypertension: Secondary | ICD-10-CM | POA: Diagnosis not present

## 2014-08-09 DIAGNOSIS — N189 Chronic kidney disease, unspecified: Secondary | ICD-10-CM | POA: Diagnosis not present

## 2014-08-09 DIAGNOSIS — R609 Edema, unspecified: Secondary | ICD-10-CM | POA: Diagnosis not present

## 2014-08-09 DIAGNOSIS — N3289 Other specified disorders of bladder: Secondary | ICD-10-CM | POA: Diagnosis not present

## 2014-08-09 DIAGNOSIS — Z23 Encounter for immunization: Secondary | ICD-10-CM | POA: Diagnosis not present

## 2014-08-15 DIAGNOSIS — J309 Allergic rhinitis, unspecified: Secondary | ICD-10-CM | POA: Diagnosis not present

## 2014-08-15 DIAGNOSIS — R0602 Shortness of breath: Secondary | ICD-10-CM | POA: Diagnosis not present

## 2014-09-01 DIAGNOSIS — J309 Allergic rhinitis, unspecified: Secondary | ICD-10-CM | POA: Diagnosis not present

## 2014-09-16 DIAGNOSIS — J309 Allergic rhinitis, unspecified: Secondary | ICD-10-CM | POA: Diagnosis not present

## 2014-09-21 DIAGNOSIS — J309 Allergic rhinitis, unspecified: Secondary | ICD-10-CM | POA: Diagnosis not present

## 2014-09-27 ENCOUNTER — Ambulatory Visit (INDEPENDENT_AMBULATORY_CARE_PROVIDER_SITE_OTHER): Payer: Medicare Other | Admitting: Podiatry

## 2014-09-27 DIAGNOSIS — M79676 Pain in unspecified toe(s): Secondary | ICD-10-CM | POA: Diagnosis not present

## 2014-09-27 DIAGNOSIS — B351 Tinea unguium: Secondary | ICD-10-CM | POA: Diagnosis not present

## 2014-09-27 NOTE — Progress Notes (Signed)
Presents today chief complaint of painful elongated toenails.  Objective: Pulses are palpable bilateral nails are thick, yellow dystrophic onychomycosis and painful palpation.   Assessment: Onychomycosis with pain in limb.  Plan: Treatment of nails in thickness and length as covered service secondary to pain.  

## 2014-09-28 DIAGNOSIS — J309 Allergic rhinitis, unspecified: Secondary | ICD-10-CM | POA: Diagnosis not present

## 2014-10-05 DIAGNOSIS — L239 Allergic contact dermatitis, unspecified cause: Secondary | ICD-10-CM | POA: Diagnosis not present

## 2014-10-18 DIAGNOSIS — J309 Allergic rhinitis, unspecified: Secondary | ICD-10-CM | POA: Diagnosis not present

## 2014-10-25 DIAGNOSIS — J309 Allergic rhinitis, unspecified: Secondary | ICD-10-CM | POA: Diagnosis not present

## 2014-11-02 DIAGNOSIS — J309 Allergic rhinitis, unspecified: Secondary | ICD-10-CM | POA: Diagnosis not present

## 2014-11-07 DIAGNOSIS — J309 Allergic rhinitis, unspecified: Secondary | ICD-10-CM | POA: Diagnosis not present

## 2014-11-07 DIAGNOSIS — L259 Unspecified contact dermatitis, unspecified cause: Secondary | ICD-10-CM | POA: Diagnosis not present

## 2014-11-07 DIAGNOSIS — J4520 Mild intermittent asthma, uncomplicated: Secondary | ICD-10-CM | POA: Diagnosis not present

## 2014-11-15 DIAGNOSIS — L259 Unspecified contact dermatitis, unspecified cause: Secondary | ICD-10-CM | POA: Diagnosis not present

## 2014-11-15 DIAGNOSIS — J309 Allergic rhinitis, unspecified: Secondary | ICD-10-CM | POA: Diagnosis not present

## 2014-11-15 DIAGNOSIS — J330 Polyp of nasal cavity: Secondary | ICD-10-CM | POA: Diagnosis not present

## 2014-11-15 DIAGNOSIS — J4520 Mild intermittent asthma, uncomplicated: Secondary | ICD-10-CM | POA: Diagnosis not present

## 2014-11-22 DIAGNOSIS — J309 Allergic rhinitis, unspecified: Secondary | ICD-10-CM | POA: Diagnosis not present

## 2014-11-22 DIAGNOSIS — L259 Unspecified contact dermatitis, unspecified cause: Secondary | ICD-10-CM | POA: Diagnosis not present

## 2014-12-07 DIAGNOSIS — D235 Other benign neoplasm of skin of trunk: Secondary | ICD-10-CM | POA: Diagnosis not present

## 2014-12-07 DIAGNOSIS — L282 Other prurigo: Secondary | ICD-10-CM | POA: Diagnosis not present

## 2014-12-07 DIAGNOSIS — L853 Xerosis cutis: Secondary | ICD-10-CM | POA: Diagnosis not present

## 2014-12-21 DIAGNOSIS — J309 Allergic rhinitis, unspecified: Secondary | ICD-10-CM | POA: Diagnosis not present

## 2014-12-22 DIAGNOSIS — R197 Diarrhea, unspecified: Secondary | ICD-10-CM | POA: Diagnosis not present

## 2014-12-22 DIAGNOSIS — R509 Fever, unspecified: Secondary | ICD-10-CM | POA: Diagnosis not present

## 2014-12-25 ENCOUNTER — Encounter: Payer: Self-pay | Admitting: Family Medicine

## 2014-12-25 ENCOUNTER — Ambulatory Visit (INDEPENDENT_AMBULATORY_CARE_PROVIDER_SITE_OTHER): Payer: Medicare Other | Admitting: Family Medicine

## 2014-12-25 ENCOUNTER — Ambulatory Visit (INDEPENDENT_AMBULATORY_CARE_PROVIDER_SITE_OTHER): Payer: Medicare Other

## 2014-12-25 VITALS — BP 126/60 | HR 98 | Temp 99.7°F | Resp 16 | Ht 63.5 in | Wt 246.2 lb

## 2014-12-25 DIAGNOSIS — J988 Other specified respiratory disorders: Secondary | ICD-10-CM | POA: Diagnosis not present

## 2014-12-25 DIAGNOSIS — R059 Cough, unspecified: Secondary | ICD-10-CM

## 2014-12-25 DIAGNOSIS — R05 Cough: Secondary | ICD-10-CM | POA: Diagnosis not present

## 2014-12-25 DIAGNOSIS — E039 Hypothyroidism, unspecified: Secondary | ICD-10-CM | POA: Diagnosis not present

## 2014-12-25 DIAGNOSIS — K449 Diaphragmatic hernia without obstruction or gangrene: Secondary | ICD-10-CM

## 2014-12-25 DIAGNOSIS — R197 Diarrhea, unspecified: Secondary | ICD-10-CM

## 2014-12-25 DIAGNOSIS — E038 Other specified hypothyroidism: Secondary | ICD-10-CM

## 2014-12-25 DIAGNOSIS — R0981 Nasal congestion: Secondary | ICD-10-CM | POA: Diagnosis not present

## 2014-12-25 DIAGNOSIS — D509 Iron deficiency anemia, unspecified: Secondary | ICD-10-CM | POA: Diagnosis not present

## 2014-12-25 DIAGNOSIS — J22 Unspecified acute lower respiratory infection: Secondary | ICD-10-CM

## 2014-12-25 LAB — POCT CBC
Granulocyte percent: 78.7 % (ref 37–80)
HCT, POC: 31.5 % — AB (ref 37.7–47.9)
Hemoglobin: 10.1 g/dL — AB (ref 12.2–16.2)
Lymph, poc: 1.3 (ref 0.6–3.4)
MCH, POC: 28.2 pg (ref 27–31.2)
MCHC: 32 g/dL (ref 31.8–35.4)
MCV: 88 fL (ref 80–97)
MID (cbc): 0.6 (ref 0–0.9)
MPV: 6.7 fL (ref 0–99.8)
POC Granulocyte: 6.8 (ref 2–6.9)
POC LYMPH PERCENT: 14.6 % (ref 10–50)
POC MID %: 6.7 %M (ref 0–12)
Platelet Count, POC: 209 10*3/uL (ref 142–424)
RBC: 3.58 M/uL — AB (ref 4.04–5.48)
RDW, POC: 16.4 %
WBC: 8.7 10*3/uL (ref 4.6–10.2)

## 2014-12-25 LAB — GLUCOSE, POCT (MANUAL RESULT ENTRY): POC Glucose: 130 mg/dL — AB (ref 70–99)

## 2014-12-25 MED ORDER — AZITHROMYCIN 250 MG PO TABS: ORAL_TABLET | ORAL | Status: DC

## 2014-12-25 NOTE — Progress Notes (Signed)
Chief Complaint:  Chief Complaint  Patient presents with  . Nausea    pt having liquid stools x 1 week.  only been on liquid diet.  she cannot eat anything other than liquid diet or it makes her nausea    HPI: Shelby Reese is a 80 y.o. female who is here for  1 week hx of nonbloody diarrhea about 4 episodes per day, she has been eating only liquids, when she takes solids her body rejects it. Denies n/v/abd pain. + cough, dry, no ear pain,  Has chronic nasal issues, sinus issues, Dr Shelia Media never gives her anything for this  She has been taking in fluids, She is not even able to tolerate saltine crackers. She has massive diarrhea.  She ahs been drinking gingerale and also caffeine sodas,  Apple juice is ok but apple  Sauce has not tried. She has no bloody stools.  She has never had anything this.  She has not had any antbiotics, years ago She has had fever of T max of 101. 4 this AM  Deneis any n/v/abd pain, Has city water through filter.  She has not eaten anything new, she  Denies anythign that may have given this to her,  Her husband is not ill and does nto  Her husband does not have this.  She has abouit 4 episodes a day.  She has not taken anything for it.  She had a colonscopy was a long time ago. She states it is normal, Dr Concha Pyo follows her and makes her give stool samples and all have been normal She had "constricted bowel"  No abuse of laxative, she never takes a laxatives NO IBS, but sure feels like it.  No one has told her she has IBD, crohns, colitis, diverticulosis No new meds No new travels No recent home visits to nursing home No new  Changes to her thyroid medicine She used to be a Marine scientist. Has a hx of anemia, took iron but stopped, but never this low.   Past Medical History  Diagnosis Date  . Hypothyroid   . Lymphedema     venous insufficency  . Osteoarthritis   . Hypertension   . Allergy   . CKD (chronic kidney disease), stage II   . Hiatal hernia   .  Varicose veins   . Anemia    Past Surgical History  Procedure Laterality Date  . Tonsillectomy    . Knee arthroscopy Left     menisectomy   History   Social History  . Marital Status: Married    Spouse Name: N/A    Number of Children: 0  . Years of Education: N/A   Occupational History  .      retired Marine scientist.    Social History Main Topics  . Smoking status: Former Smoker -- 0.50 packs/day for 10 years    Quit date: 11/19/1979  . Smokeless tobacco: Never Used  . Alcohol Use: 6.0 oz/week    10 Glasses of wine per week  . Drug Use: No  . Sexual Activity: None   Other Topics Concern  . None   Social History Narrative   Family History  Problem Relation Age of Onset  . Stroke Mother   . Varicose Veins Mother   . Cancer Father     prostate  . Stroke Sister   . Heart disease Sister   . Varicose Veins Sister   . Stroke Maternal Grandmother   . Varicose  Veins Sister    Allergies  Allergen Reactions  . Bactrim [Sulfamethoxazole-Trimethoprim] Diarrhea and Nausea Only  . Ciprofloxacin Other (See Comments)    tremors  . Diovan [Valsartan] Other (See Comments)    Extreme vertigo  . Nitrofuran Derivatives Hives  . Penicillins Hives and Swelling   Prior to Admission medications   Medication Sig Start Date End Date Taking? Authorizing Provider  acetaminophen (TYLENOL) 500 MG tablet Take 1,000 mg by mouth daily.   Yes Historical Provider, MD  aspirin 81 MG tablet Take 81 mg by mouth daily.   Yes Historical Provider, MD  benazepril (LOTENSIN) 40 MG tablet Take 40 mg by mouth daily. 03/26/13  Yes Historical Provider, MD  Calcium Carbonate-Vitamin D (CALCIUM 600+D) 600-400 MG-UNIT per tablet Take 1 tablet by mouth daily.   Yes Historical Provider, MD  cetirizine (ZYRTEC) 10 MG tablet Take 10 mg by mouth daily.   Yes Historical Provider, MD  diltiazem (TIAZAC) 120 MG 24 hr capsule Take 120 mg by mouth daily. 01/29/13  Yes Historical Provider, MD  diphenhydrAMINE (BENADRYL) 25 mg  capsule Take 50 mg by mouth 2 (two) times daily. 50 mg am and HS,  25 mg in afternoon   Yes Historical Provider, MD  famotidine (PEPCID) 10 MG tablet Take 10 mg by mouth daily.   Yes Historical Provider, MD  ibuprofen (ADVIL,MOTRIN) 200 MG tablet Take 400 mg by mouth daily.   Yes Historical Provider, MD  MAGNESIUM CARBONATE PO Take 133 mg by mouth.   Yes Historical Provider, MD  Multiple Vitamin (MULTIVITAMIN) tablet Take 1 tablet by mouth daily.   Yes Historical Provider, MD  NATURAL PSYLLIUM FIBER PO Take 1 capsule by mouth daily.   Yes Historical Provider, MD  OVER THE COUNTER MEDICATION Take 1 tablet by mouth 2 (two) times daily. Osteo Biflex   Yes Historical Provider, MD  OVER THE COUNTER MEDICATION Take 1 tablet by mouth daily. Magnesium 133 mg with 5 mg cheated zinc   Yes Historical Provider, MD  Oxybutynin 3 (28) % (MG/ACT) GEL Place onto the skin.   Yes Historical Provider, MD  pseudoephedrine (SUDAFED) 30 MG tablet Take 30 mg by mouth 2 (two) times daily.   Yes Historical Provider, MD  SYNTHROID 175 MCG tablet Take 175 mcg by mouth daily. 04/07/13  Yes Historical Provider, MD  vitamin C (ASCORBIC ACID) 500 MG tablet Take 500 mg by mouth daily.   Yes Historical Provider, MD     ROS: The patient denies chills, night sweats, unintentional weight loss, chest pain, palpitations, wheezing, dyspnea on exertion, nausea, vomiting, abdominal pain, dysuria, hematuria, melena, numbness, weakness, or tingling.   All other systems have been reviewed and were otherwise negative with the exception of those mentioned in the HPI and as above.    PHYSICAL EXAM: Filed Vitals:   12/25/14 1438  BP: 126/60  Pulse: 98  Temp: 99.7 F (37.6 C)  Resp: 16   Filed Vitals:   12/25/14 1438  Height: 5' 3.5" (1.613 m)  Weight: 246 lb 4 oz (111.698 kg)   Body mass index is 42.93 kg/(m^2).  General: Alert, no acute distress, obese, elderly HEENT:  Normocephalic, atraumatic, oropharynx patent. EOMI, PERRLA,  TM normal  nontender sinuses, throat non erythematous Cardiovascular:  Regular rate and rhythm, no rubs murmurs or gallops.  No Carotid bruits, radial pulse intact. +pedal edema/venous stasis.  Respiratory: Clear to auscultation bilaterally.  No wheezes, rales, or rhonchi.  No cyanosis, no use of accessory musculature GI: No organomegaly,  abdomen is soft and non-tender, positive bowel sounds.  No masses. No acute abd, patietn has no LLQ abd pain Skin: No rashes. Neurologic: Facial musculature symmetric. Psychiatric: Patient is appropriate throughout our interaction. Lymphatic: No cervical lymphadenopathy Musculoskeletal: Gait intact.   LABS: Results for orders placed or performed in visit on 12/25/14  COMPLETE METABOLIC PANEL WITH GFR  Result Value Ref Range   Sodium 133 (L) 135 - 145 mEq/L   Potassium 4.1 3.5 - 5.3 mEq/L   Chloride 101 96 - 112 mEq/L   CO2 21 19 - 32 mEq/L   Glucose, Bld 130 (H) 70 - 99 mg/dL   BUN 20 6 - 23 mg/dL   Creat 1.21 (H) 0.50 - 1.10 mg/dL   Total Bilirubin 0.4 0.2 - 1.2 mg/dL   Alkaline Phosphatase 62 39 - 117 U/L   AST 34 0 - 37 U/L   ALT 37 (H) 0 - 35 U/L   Total Protein 6.3 6.0 - 8.3 g/dL   Albumin 3.5 3.5 - 5.2 g/dL   Calcium 8.5 8.4 - 10.5 mg/dL   GFR, Est African American 50 (L) mL/min   GFR, Est Non African American 43 (L) mL/min  TSH  Result Value Ref Range   TSH 0.178 (L) 0.350 - 4.500 uIU/mL  POCT CBC  Result Value Ref Range   WBC 8.7 4.6 - 10.2 K/uL   Lymph, poc 1.3 0.6 - 3.4   POC LYMPH PERCENT 14.6 10 - 50 %L   MID (cbc) 0.6 0 - 0.9   POC MID % 6.7 0 - 12 %M   POC Granulocyte 6.8 2 - 6.9   Granulocyte percent 78.7 37 - 80 %G   RBC 3.58 (A) 4.04 - 5.48 M/uL   Hemoglobin 10.1 (A) 12.2 - 16.2 g/dL   HCT, POC 31.5 (A) 37.7 - 47.9 %   MCV 88.0 80 - 97 fL   MCH, POC 28.2 27 - 31.2 pg   MCHC 32.0 31.8 - 35.4 g/dL   RDW, POC 16.4 %   Platelet Count, POC 209 142 - 424 K/uL   MPV 6.7 0 - 99.8 fL  POCT glucose (manual entry)  Result  Value Ref Range   POC Glucose 130 (A) 70 - 99 mg/dl     EKG/XRAY:   Primary read interpreted by Dr. Marin Comment at [facility]. Please comment if increase  vascular markings or left lower lobe infiltrate No obvious free air, SBO   ASSESSMENT/PLAN: Encounter Diagnoses  Name Primary?  . Diarrhea Yes  . Other specified hypothyroidism   . Hiatal hernia   . Nasal congestion   . Cough   . Hypothyroidism, unspecified hypothyroidism type   . Anemia, iron deficiency   . Lower respiratory infection (e.g., bronchitis, pneumonia, pneumonitis, pulmonitis)    Stool samples to take home and drop off.  We discussed the official xray results, I would like her to give me a stool sample before she takes her z pack which she states she can tolerate the z pack Push fluids at home,, try advancing her diet, again she has a hx of diarrhea and anemia but not like this.  No LLQ abd pain and no leukocytosis so unlikely diverticular Labs pending F/u prn     Gross sideeffects, risk and benefits, and alternatives of medications d/w patient. Patient is aware that all medications have potential sideeffects and we are unable to predict every sideeffect or drug-drug interaction that may occur.  LE, [place], DO 12/27/2014 2:34 PM

## 2014-12-25 NOTE — Patient Instructions (Signed)

## 2014-12-26 LAB — COMPLETE METABOLIC PANEL WITHOUT GFR
ALT: 37 U/L — ABNORMAL HIGH (ref 0–35)
AST: 34 U/L (ref 0–37)
Albumin: 3.5 g/dL (ref 3.5–5.2)
Alkaline Phosphatase: 62 U/L (ref 39–117)
BUN: 20 mg/dL (ref 6–23)
Calcium: 8.5 mg/dL (ref 8.4–10.5)
Chloride: 101 meq/L (ref 96–112)
Creat: 1.21 mg/dL — ABNORMAL HIGH (ref 0.50–1.10)
GFR, Est African American: 50 mL/min — ABNORMAL LOW
GFR, Est Non African American: 43 mL/min — ABNORMAL LOW
Glucose, Bld: 130 mg/dL — ABNORMAL HIGH (ref 70–99)
Sodium: 133 meq/L — ABNORMAL LOW (ref 135–145)

## 2014-12-26 LAB — COMPLETE METABOLIC PANEL WITH GFR
CO2: 21 mEq/L (ref 19–32)
Potassium: 4.1 mEq/L (ref 3.5–5.3)
Total Bilirubin: 0.4 mg/dL (ref 0.2–1.2)
Total Protein: 6.3 g/dL (ref 6.0–8.3)

## 2014-12-26 LAB — TSH: TSH: 0.178 u[IU]/mL — ABNORMAL LOW (ref 0.350–4.500)

## 2014-12-27 ENCOUNTER — Telehealth: Payer: Self-pay | Admitting: Family Medicine

## 2014-12-27 ENCOUNTER — Encounter: Payer: Self-pay | Admitting: Family Medicine

## 2014-12-27 ENCOUNTER — Ambulatory Visit: Payer: Medicare Other | Admitting: Podiatry

## 2014-12-27 DIAGNOSIS — K449 Diaphragmatic hernia without obstruction or gangrene: Secondary | ICD-10-CM | POA: Insufficient documentation

## 2014-12-27 DIAGNOSIS — D509 Iron deficiency anemia, unspecified: Secondary | ICD-10-CM | POA: Insufficient documentation

## 2014-12-27 DIAGNOSIS — E039 Hypothyroidism, unspecified: Secondary | ICD-10-CM | POA: Insufficient documentation

## 2014-12-27 MED ORDER — SYNTHROID 125 MCG PO TABS: 125.0000 ug | ORAL_TABLET | Freq: Every day | ORAL | Status: DC

## 2014-12-27 NOTE — Telephone Encounter (Signed)
Spoke to patient about labs. She feels the same. TSH was low, will change from 175 mcg to 125 mcg. I will go with a 50 mcg decrease since I think some of her sxs are from this. Will check up on her in a few days to see how she is feeling. SHe has yet to give me a stool sample.

## 2015-01-01 ENCOUNTER — Telehealth: Payer: Self-pay

## 2015-01-01 DIAGNOSIS — E038 Other specified hypothyroidism: Secondary | ICD-10-CM | POA: Diagnosis not present

## 2015-01-01 DIAGNOSIS — R0981 Nasal congestion: Secondary | ICD-10-CM | POA: Diagnosis not present

## 2015-01-01 DIAGNOSIS — R197 Diarrhea, unspecified: Secondary | ICD-10-CM | POA: Diagnosis not present

## 2015-01-01 DIAGNOSIS — K449 Diaphragmatic hernia without obstruction or gangrene: Secondary | ICD-10-CM | POA: Diagnosis not present

## 2015-01-01 NOTE — Telephone Encounter (Signed)
Dr Marin Comment, pt returned her stool samples but only gave [place] the stool cx and the o&p. Was unable to collect stool WBC and CDiff. The following letter was sent with the stool:  "Dr. Marin Comment,  Am returning all sample vials. Most are unused as it took several days to achieve rehydration status during which time no stool was formed. Am sending what I got. Upon achieving rehydration and tolerating solid food some 5 days later, still without any stool but a temperature up to 100.4 degrees, I started on the antibiotic. Now, several days later, my status continues to improves.  A point of contention: To use a lab result done by another physician 18 days earlier without context to change a long-established dosage of synthroid was ill-considered at best.  I will not even consider changing dosage until my body has regained some degree of normal function. My current dosage was carefully titrated over time for a thyroid which has only 50% function. The gland did not suddenly regenerate. TSH values are affected by steroids so the prednisone utilized to combat the anaphylaxis from the insect bite was undoubtedly causing the TSH to change. Will have a new TSH done April.  Shelby Reese"  Sent letter to be scanned

## 2015-01-03 LAB — OVA AND PARASITE EXAMINATION: OP: NONE SEEN

## 2015-01-04 ENCOUNTER — Encounter: Payer: Self-pay | Admitting: Podiatry

## 2015-01-04 ENCOUNTER — Ambulatory Visit (INDEPENDENT_AMBULATORY_CARE_PROVIDER_SITE_OTHER): Payer: Medicare Other | Admitting: Podiatry

## 2015-01-04 VITALS — BP 138/53 | HR 97 | Resp 18

## 2015-01-04 DIAGNOSIS — M79676 Pain in unspecified toe(s): Secondary | ICD-10-CM

## 2015-01-04 DIAGNOSIS — B351 Tinea unguium: Secondary | ICD-10-CM

## 2015-01-05 LAB — STOOL CULTURE

## 2015-01-05 NOTE — Progress Notes (Signed)
Patient ID: Shelby Reese, female   DOB: October 18, 1935, 80 y.o.   MRN: [id number]  Subjective: 80 year old female presents the office with complaints of painful, elongated, thick toenails for which she is unable to trim herself. She states that since last point she is been bitten by an insect which has resulted in total body swelling and she has had many complications with this since. She states that she has seen her primary care physician who is injured to a rheumatologist for evaluation. She states that since this is started her feet is also been swollen. Since last appointment she is also seen in the podiatrists for the swelling in her legs. No other complaints at this time.  Objective: AAO 3, NAD DP/PT pulses palpable, CRT less than 3 seconds Protective sensation intact with Simms Weinstein monofilament, vibratory sensation intact, Achilles tendon reflex intact. There is bilateral lower sure me edema. There is no overlying erythema or increase in warmth. Nails are hypertrophic, dystrophic, elongated, brittle, discolored 10. There is no surrounding erythema or drainage from the nail sites. There is subjective tenderness to bilateral nails on digits 1 through 5. No open lesions or pre-ulcer lesions identified bilaterally. No other areas of tenderness bilateral lower extremities. No pain with calf compression, swelling, warmth, erythema.  Assessment: 80 year old female with symptoms onychomycosis  Plan: -Treatment options discussed including alternatives, risks, complications. -Nail sharply debrided 10 without complication/bleeding. -Recommend follow-up with her rheumatologist for body swelling and other issues. -Discussed daily foot inspection. -Follow-up in 3 months or sooner if any problems are to arise. In the meantime occurs all the office with any questions, concerns, change in symptoms.

## 2015-02-21 DIAGNOSIS — E780 Pure hypercholesterolemia: Secondary | ICD-10-CM | POA: Diagnosis not present

## 2015-02-21 DIAGNOSIS — E039 Hypothyroidism, unspecified: Secondary | ICD-10-CM | POA: Diagnosis not present

## 2015-02-21 DIAGNOSIS — D649 Anemia, unspecified: Secondary | ICD-10-CM | POA: Diagnosis not present

## 2015-02-21 DIAGNOSIS — I10 Essential (primary) hypertension: Secondary | ICD-10-CM | POA: Diagnosis not present

## 2015-02-22 DIAGNOSIS — N390 Urinary tract infection, site not specified: Secondary | ICD-10-CM | POA: Diagnosis not present

## 2015-02-22 DIAGNOSIS — N189 Chronic kidney disease, unspecified: Secondary | ICD-10-CM | POA: Diagnosis not present

## 2015-02-23 DIAGNOSIS — Z0000 Encounter for general adult medical examination without abnormal findings: Secondary | ICD-10-CM | POA: Diagnosis not present

## 2015-02-23 DIAGNOSIS — N189 Chronic kidney disease, unspecified: Secondary | ICD-10-CM | POA: Diagnosis not present

## 2015-02-27 DIAGNOSIS — N183 Chronic kidney disease, stage 3 (moderate): Secondary | ICD-10-CM | POA: Diagnosis not present

## 2015-02-27 DIAGNOSIS — L309 Dermatitis, unspecified: Secondary | ICD-10-CM | POA: Diagnosis not present

## 2015-02-27 DIAGNOSIS — E780 Pure hypercholesterolemia: Secondary | ICD-10-CM | POA: Diagnosis not present

## 2015-02-27 DIAGNOSIS — D72829 Elevated white blood cell count, unspecified: Secondary | ICD-10-CM | POA: Diagnosis not present

## 2015-03-03 DIAGNOSIS — R609 Edema, unspecified: Secondary | ICD-10-CM | POA: Diagnosis not present

## 2015-03-03 DIAGNOSIS — R21 Rash and other nonspecific skin eruption: Secondary | ICD-10-CM | POA: Diagnosis not present

## 2015-03-03 DIAGNOSIS — I83029 Varicose veins of left lower extremity with ulcer of unspecified site: Secondary | ICD-10-CM | POA: Diagnosis not present

## 2015-03-14 DIAGNOSIS — L309 Dermatitis, unspecified: Secondary | ICD-10-CM | POA: Diagnosis not present

## 2015-03-14 DIAGNOSIS — I10 Essential (primary) hypertension: Secondary | ICD-10-CM | POA: Diagnosis not present

## 2015-04-05 ENCOUNTER — Ambulatory Visit: Payer: Medicare Other | Admitting: Podiatry

## 2015-04-06 ENCOUNTER — Ambulatory Visit (INDEPENDENT_AMBULATORY_CARE_PROVIDER_SITE_OTHER): Payer: Medicare Other | Admitting: Podiatry

## 2015-04-06 ENCOUNTER — Encounter: Payer: Self-pay | Admitting: Podiatry

## 2015-04-06 DIAGNOSIS — B351 Tinea unguium: Secondary | ICD-10-CM

## 2015-04-06 DIAGNOSIS — M79676 Pain in unspecified toe(s): Secondary | ICD-10-CM | POA: Diagnosis not present

## 2015-04-06 NOTE — Progress Notes (Signed)
Patient presents to the office today with a chief complaint of painful elongated toenails.  Objective: Pulses are palpable bilateral. Nails are thick yellow dystrophic clinically mycotic and painful palpation.  Assessment: Pain in limb secondary to onychomycosis 1 through 5 bilateral.  Plan: Debridement of nails 1 through 5 bilateral covered service secondary to pain.  

## 2015-04-13 DIAGNOSIS — L299 Pruritus, unspecified: Secondary | ICD-10-CM | POA: Diagnosis not present

## 2015-04-13 DIAGNOSIS — R21 Rash and other nonspecific skin eruption: Secondary | ICD-10-CM | POA: Diagnosis not present

## 2015-05-15 ENCOUNTER — Other Ambulatory Visit: Payer: Self-pay

## 2015-05-23 DIAGNOSIS — L299 Pruritus, unspecified: Secondary | ICD-10-CM | POA: Diagnosis not present

## 2015-05-23 DIAGNOSIS — L300 Nummular dermatitis: Secondary | ICD-10-CM | POA: Diagnosis not present

## 2015-06-19 DIAGNOSIS — Z961 Presence of intraocular lens: Secondary | ICD-10-CM | POA: Diagnosis not present

## 2015-06-28 ENCOUNTER — Emergency Department (HOSPITAL_COMMUNITY)
Admission: EM | Admit: 2015-06-28 | Discharge: 2015-06-28 | Disposition: A | Discharge status: Home / Self Care | Payer: Medicare Other | Attending: Emergency Medicine | Admitting: Emergency Medicine

## 2015-06-28 ENCOUNTER — Emergency Department (HOSPITAL_COMMUNITY): Payer: Medicare Other

## 2015-06-28 ENCOUNTER — Encounter (HOSPITAL_COMMUNITY): Payer: Self-pay

## 2015-06-28 DIAGNOSIS — Y998 Other external cause status: Secondary | ICD-10-CM | POA: Insufficient documentation

## 2015-06-28 DIAGNOSIS — N182 Chronic kidney disease, stage 2 (mild): Secondary | ICD-10-CM | POA: Insufficient documentation

## 2015-06-28 DIAGNOSIS — E039 Hypothyroidism, unspecified: Secondary | ICD-10-CM | POA: Diagnosis not present

## 2015-06-28 DIAGNOSIS — W06XXXA Fall from bed, initial encounter: Secondary | ICD-10-CM | POA: Insufficient documentation

## 2015-06-28 DIAGNOSIS — W19XXXA Unspecified fall, initial encounter: Secondary | ICD-10-CM

## 2015-06-28 DIAGNOSIS — Z862 Personal history of diseases of the blood and blood-forming organs and certain disorders involving the immune mechanism: Secondary | ICD-10-CM | POA: Insufficient documentation

## 2015-06-28 DIAGNOSIS — Z23 Encounter for immunization: Secondary | ICD-10-CM | POA: Diagnosis not present

## 2015-06-28 DIAGNOSIS — Z79899 Other long term (current) drug therapy: Secondary | ICD-10-CM | POA: Diagnosis not present

## 2015-06-28 DIAGNOSIS — Z7982 Long term (current) use of aspirin: Secondary | ICD-10-CM | POA: Insufficient documentation

## 2015-06-28 DIAGNOSIS — Z87891 Personal history of nicotine dependence: Secondary | ICD-10-CM | POA: Diagnosis not present

## 2015-06-28 DIAGNOSIS — Y9389 Activity, other specified: Secondary | ICD-10-CM | POA: Insufficient documentation

## 2015-06-28 DIAGNOSIS — M1990 Unspecified osteoarthritis, unspecified site: Secondary | ICD-10-CM | POA: Insufficient documentation

## 2015-06-28 DIAGNOSIS — I129 Hypertensive chronic kidney disease with stage 1 through stage 4 chronic kidney disease, or unspecified chronic kidney disease: Secondary | ICD-10-CM | POA: Insufficient documentation

## 2015-06-28 DIAGNOSIS — Y9289 Other specified places as the place of occurrence of the external cause: Secondary | ICD-10-CM | POA: Insufficient documentation

## 2015-06-28 DIAGNOSIS — S098XXA Other specified injuries of head, initial encounter: Secondary | ICD-10-CM | POA: Diagnosis not present

## 2015-06-28 DIAGNOSIS — S0190XA Unspecified open wound of unspecified part of head, initial encounter: Secondary | ICD-10-CM | POA: Diagnosis not present

## 2015-06-28 DIAGNOSIS — Z791 Long term (current) use of non-steroidal anti-inflammatories (NSAID): Secondary | ICD-10-CM | POA: Diagnosis not present

## 2015-06-28 DIAGNOSIS — S0181XA Laceration without foreign body of other part of head, initial encounter: Secondary | ICD-10-CM | POA: Diagnosis not present

## 2015-06-28 DIAGNOSIS — Z880 Allergy status to penicillin: Secondary | ICD-10-CM | POA: Insufficient documentation

## 2015-06-28 DIAGNOSIS — S0990XA Unspecified injury of head, initial encounter: Secondary | ICD-10-CM | POA: Diagnosis not present

## 2015-06-28 DIAGNOSIS — S0532XA Ocular laceration without prolapse or loss of intraocular tissue, left eye, initial encounter: Secondary | ICD-10-CM | POA: Diagnosis not present

## 2015-06-28 DIAGNOSIS — Z8719 Personal history of other diseases of the digestive system: Secondary | ICD-10-CM | POA: Insufficient documentation

## 2015-06-28 MED ORDER — TETANUS-DIPHTH-ACELL PERTUSSIS 5-2.5-18.5 LF-MCG/0.5 IM SUSP
0.5000 mL | Freq: Once | INTRAMUSCULAR | Status: AC
  Administered 2015-06-28: 0.5 mL via INTRAMUSCULAR
  Filled 2015-06-28: qty 0.5

## 2015-06-28 NOTE — ED Provider Notes (Signed)
CSN: [id number]     Arrival date & time 06/28/15  0138 History   First MD Initiated Contact with Patient 06/28/15 [phone]     Chief Complaint  Patient presents with  . Fall    Fall out of bed approx 1hr ago.  Lac to head     (Consider location/radiation/quality/duration/timing/severity/associated sxs/prior Treatment) HPI Comments: Patient states that she has a sleep number bed and when she rolled over in bed.  She leaned too far, falling out, hitting the left side of her face on her and table.  She has a small laceration to the left lateral zygomatic arch area with no active bleeding.  She says she has pain at the angle of her jaw when she opens and closes her mouth.  She denies loss of consciousness or neck pain.  She cannot remember her last tetanus immunization  The history is provided by the patient.    Past Medical History  Diagnosis Date  . Hypothyroid   . Lymphedema     venous insufficency  . Osteoarthritis   . Hypertension   . Allergy   . CKD (chronic kidney disease), stage II   . Hiatal hernia   . Varicose veins   . Anemia    Past Surgical History  Procedure Laterality Date  . Tonsillectomy    . Knee arthroscopy Left     menisectomy   Family History  Problem Relation Age of Onset  . Stroke Mother   . Varicose Veins Mother   . Cancer Father     prostate  . Stroke Sister   . Heart disease Sister   . Varicose Veins Sister   . Stroke Maternal Grandmother   . Varicose Veins Sister    Social History  Substance Use Topics  . Smoking status: Former Smoker -- 0.50 packs/day for 10 years    Quit date: 11/19/1979  . Smokeless tobacco: Never Used  . Alcohol Use: 6.0 oz/week    10 Glasses of wine per week   OB History    No data available     Review of Systems  Constitutional: Negative for fever and chills.  Musculoskeletal: Positive for arthralgias. Negative for back pain and neck pain.  Neurological: Negative for dizziness and headaches.  All other systems  reviewed and are negative.     Allergies  Bactrim; Ciprofloxacin; Diovan; Nitrofuran derivatives; and Penicillins  Home Medications   Prior to Admission medications   Medication Sig Start Date End Date Taking? Authorizing Provider  acetaminophen (TYLENOL) 500 MG tablet Take 1,000 mg by mouth daily.   Yes Historical Provider, MD  aspirin 81 MG tablet Take 81 mg by mouth daily.   Yes Historical Provider, MD  benazepril (LOTENSIN) 40 MG tablet Take 40 mg by mouth daily. 03/26/13  Yes Historical Provider, MD  Calcium Carbonate-Vitamin D (CALCIUM 600+D) 600-400 MG-UNIT per tablet Take 1 tablet by mouth daily.   Yes Historical Provider, MD  cetirizine (ZYRTEC) 10 MG tablet Take 10 mg by mouth every 6 (six) hours.    Yes Historical Provider, MD  clobetasol (TEMOVATE) 0.05 % external solution Apply 1 application topically as directed. 04/14/15  Yes Historical Provider, MD  clobetasol cream (TEMOVATE) 4.62 % Apply 1 application topically 2 (two) times daily as needed. For itching. 05/26/15  Yes Historical Provider, MD  diltiazem (TIAZAC) 120 MG 24 hr capsule Take 120 mg by mouth daily. 01/29/13  Yes Historical Provider, MD  diphenhydrAMINE (BENADRYL) 25 mg capsule Take 50 mg by mouth every  8 (eight) hours as needed (for sneezing).    Yes Historical Provider, MD  famotidine (PEPCID) 10 MG tablet Take 10 mg by mouth daily.   Yes Historical Provider, MD  ibuprofen (ADVIL,MOTRIN) 200 MG tablet Take 400 mg by mouth daily.   Yes Historical Provider, MD  MAGNESIUM CARBONATE PO Take 125 mg by mouth.    Yes Historical Provider, MD  Multiple Vitamin (MULTIVITAMIN) tablet Take 1 tablet by mouth daily.   Yes Historical Provider, MD  NATURAL PSYLLIUM FIBER PO Take 1 capsule by mouth daily.   Yes Historical Provider, MD  OVER THE COUNTER MEDICATION Take 1 tablet by mouth 2 (two) times daily. Osteo Biflex   Yes Historical Provider, MD  OVER THE COUNTER MEDICATION Take 1 tablet by mouth daily. Magnesium 133 mg with 5 mg  cheated zinc   Yes Historical Provider, MD  pseudoephedrine (SUDAFED) 30 MG tablet Take 30 mg by mouth every 6 (six) hours.    Yes Historical Provider, MD  SYNTHROID 125 MCG tablet Take 1 tablet (125 mcg total) by mouth daily before breakfast. Recheck TSH in 6 weeks pr sooner if needed. Stop the 175 mcg dose. 12/27/14  Yes Thao P Le, DO  vitamin C (ASCORBIC ACID) 500 MG tablet Take 500 mg by mouth daily.   Yes Historical Provider, MD  azithromycin (ZITHROMAX) 250 MG tablet Take 2 tabs po now then 1 tab po daily Patient not taking: Reported on 06/28/2015 12/25/14   Thao P Le, DO   BP 191/58 mmHg  Pulse 101  Temp(Src) 97.9 F (36.6 C) (Oral)  Resp 23  SpO2 100% Physical Exam  Constitutional: She is oriented to person, place, and time. She appears well-developed and well-nourished.  HENT:  Head: Normocephalic.    Right Ear: External ear normal.  Left Ear: External ear normal.  Neck: Normal range of motion.  Cardiovascular: Normal rate and regular rhythm.   Pulmonary/Chest: Effort normal and breath sounds normal.  Neurological: She is alert and oriented to person, place, and time.  Skin: Skin is warm. No rash noted. No erythema.  Nursing note and vitals reviewed.   ED Course  Procedures (including critical care time) Labs Review Labs Reviewed - No data to display  Imaging Review Ct Head Wo Contrast  06/28/2015   CLINICAL DATA:  Status post fall out of bed. Head hit nightstand, with vertical laceration about the left orbit. Concern for head injury. Initial encounter.  EXAM: CT HEAD WITHOUT CONTRAST  CT MAXILLOFACIAL WITHOUT CONTRAST  TECHNIQUE: Multidetector CT imaging of the head and maxillofacial structures were performed using the standard protocol without intravenous contrast. Multiplanar CT image reconstructions of the maxillofacial structures were also generated.  COMPARISON:  CT of the paranasal sinuses performed 05/20/2005  FINDINGS: CT HEAD FINDINGS  There is no evidence of acute  infarction, mass lesion, or intra- or extra-axial hemorrhage on CT.  Prominence of the ventricles and sulci reflects mild to moderate cortical volume loss. Mild cerebellar atrophy is noted. Scattered periventricular white matter change likely reflects small vessel ischemic microangiopathy.  The brainstem and fourth ventricle are within normal limits. The basal ganglia are unremarkable in appearance. The cerebral hemispheres demonstrate grossly normal gray-white differentiation. No mass effect or midline shift is seen.  There is no evidence of fracture; visualized osseous structures are unremarkable in appearance. The orbits are within normal limits. There is opacification of the left frontal sinus. The patient is status post resection of the ethmoid air cells and bilateral maxillary antrostomy. The  remaining paranasal sinuses and mastoid air cells are well-aerated. Mild soft tissue injury is noted lateral to the left orbit.  CT MAXILLOFACIAL FINDINGS  There is no evidence of fracture or dislocation. The maxilla and mandible appear intact. The nasal bone is unremarkable in appearance. The visualized dentition demonstrates no acute abnormality.  The orbits are intact bilaterally. There is opacification of the left frontal sinus. The patient is status post resection of the ethmoid air cells and bilateral maxillary antrostomy. The remaining visualized paranasal sinuses and mastoid air cells are well-aerated.  Mild soft tissue injury is noted overlying the left maxilla and upper left mandible. Mild soft tissue injury is also noted lateral to the left orbit. The parapharyngeal fat planes are preserved. The nasopharynx, oropharynx and hypopharynx are unremarkable in appearance. The visualized portions of the valleculae and piriform sinuses are grossly unremarkable.  The parotid and submandibular glands are within normal limits. No cervical lymphadenopathy is seen. Calcification is noted at the carotid bifurcations  bilaterally.  IMPRESSION: 1. No evidence of traumatic intracranial injury or fracture. 2. No evidence of fracture or dislocation with regard to the maxillofacial structures. 3. Mild soft tissue injury lateral to the left orbit. Mild soft tissue injury overlying the left maxilla and upper left mandible. 4. Mild to moderate cortical volume loss and scattered small vessel ischemic microangiopathy. 5. Opacification of the left frontal sinus. 6. Calcification at the carotid bifurcations bilaterally. Carotid ultrasound would be helpful for further evaluation, when and as deemed clinically appropriate.   Electronically Signed   By: Garald Balding M.D.   On: 06/28/2015 03:30   Ct Maxillofacial Wo Cm  06/28/2015   CLINICAL DATA:  Status post fall out of bed. Head hit nightstand, with vertical laceration about the left orbit. Concern for head injury. Initial encounter.  EXAM: CT HEAD WITHOUT CONTRAST  CT MAXILLOFACIAL WITHOUT CONTRAST  TECHNIQUE: Multidetector CT imaging of the head and maxillofacial structures were performed using the standard protocol without intravenous contrast. Multiplanar CT image reconstructions of the maxillofacial structures were also generated.  COMPARISON:  CT of the paranasal sinuses performed 05/20/2005  FINDINGS: CT HEAD FINDINGS  There is no evidence of acute infarction, mass lesion, or intra- or extra-axial hemorrhage on CT.  Prominence of the ventricles and sulci reflects mild to moderate cortical volume loss. Mild cerebellar atrophy is noted. Scattered periventricular white matter change likely reflects small vessel ischemic microangiopathy.  The brainstem and fourth ventricle are within normal limits. The basal ganglia are unremarkable in appearance. The cerebral hemispheres demonstrate grossly normal gray-white differentiation. No mass effect or midline shift is seen.  There is no evidence of fracture; visualized osseous structures are unremarkable in appearance. The orbits are within  normal limits. There is opacification of the left frontal sinus. The patient is status post resection of the ethmoid air cells and bilateral maxillary antrostomy. The remaining paranasal sinuses and mastoid air cells are well-aerated. Mild soft tissue injury is noted lateral to the left orbit.  CT MAXILLOFACIAL FINDINGS  There is no evidence of fracture or dislocation. The maxilla and mandible appear intact. The nasal bone is unremarkable in appearance. The visualized dentition demonstrates no acute abnormality.  The orbits are intact bilaterally. There is opacification of the left frontal sinus. The patient is status post resection of the ethmoid air cells and bilateral maxillary antrostomy. The remaining visualized paranasal sinuses and mastoid air cells are well-aerated.  Mild soft tissue injury is noted overlying the left maxilla and upper left mandible. Mild  soft tissue injury is also noted lateral to the left orbit. The parapharyngeal fat planes are preserved. The nasopharynx, oropharynx and hypopharynx are unremarkable in appearance. The visualized portions of the valleculae and piriform sinuses are grossly unremarkable.  The parotid and submandibular glands are within normal limits. No cervical lymphadenopathy is seen. Calcification is noted at the carotid bifurcations bilaterally.  IMPRESSION: 1. No evidence of traumatic intracranial injury or fracture. 2. No evidence of fracture or dislocation with regard to the maxillofacial structures. 3. Mild soft tissue injury lateral to the left orbit. Mild soft tissue injury overlying the left maxilla and upper left mandible. 4. Mild to moderate cortical volume loss and scattered small vessel ischemic microangiopathy. 5. Opacification of the left frontal sinus. 6. Calcification at the carotid bifurcations bilaterally. Carotid ultrasound would be helpful for further evaluation, when and as deemed clinically appropriate.   Electronically Signed   By: Garald Balding  M.D.   On: 06/28/2015 03:30     EKG Interpretation None     CT scan of head and face normal, without any fractures The superficial laceration does not require any sutures.  At this time, it was scabbed over.  By the time of examination MDM   Final diagnoses:  Fall  Facial laceration, initial encounter         Shelby Creamer, NP 06/28/15 [place], MD 06/28/15 [phone]

## 2015-06-28 NOTE — ED Notes (Signed)
Pt transported by [facility] for fall from bed.  Pt states she was reaching for a tissue and mattress flipped her onto the floor.  Pt denies LOC.  Laceration to left temple.  No other injuries noted.

## 2015-06-28 NOTE — ED Notes (Signed)
Patient transported to CT 

## 2015-06-28 NOTE — Discharge Instructions (Signed)
Facial Laceration A facial laceration is a cut on the face. These injuries can be painful and cause bleeding. Some cuts may need to be closed with stitches (sutures), skin adhesive strips, or wound glue. Cuts usually heal quickly but can leave a scar. It can take 1-2 years for the scar to go away completely. HOME CARE   Only take medicines as told by your doctor.  Follow your doctor's instructions for wound care. For Stitches:  Keep the cut clean and dry.  If you have a bandage (dressing), change it at least once a day. Change the bandage if it gets wet or dirty, or as told by your doctor.  Wash the cut with soap and water 2 times a day. Rinse the cut with water. Pat it dry with a clean towel.  Put a thin layer of medicated cream on the cut as told by your doctor.  You may shower after the first 24 hours. Do not soak the cut in water until the stitches are removed.  Have your stitches removed as told by your doctor.  Do not wear any makeup until a few days after your stitches are removed. For Skin Adhesive Strips:  Keep the cut clean and dry.  Do not get the strips wet. You may take a bath, but be careful to keep the cut dry.  If the cut gets wet, pat it dry with a clean towel.  The strips will fall off on their own. Do not remove the strips that are still stuck to the cut. For Wound Glue:  You may shower or take baths. Do not soak or scrub the cut. Do not swim. Avoid heavy sweating until the glue falls off on its own. After a shower or bath, pat the cut dry with a clean towel.  Do not put medicine or makeup on your cut until the glue falls off.  If you have a bandage, do not put tape over the glue.  Avoid lots of sunlight or tanning lamps until the glue falls off.  The glue will fall off on its own in 5-10 days. Do not pick at the glue. After Healing: Put sunscreen on the cut for the first year to reduce your scar. GET HELP RIGHT AWAY IF:   Your cut area gets red,  painful, or puffy (swollen).  You see a yellowish-white fluid (pus) coming from the cut.  You have chills or a fever. MAKE SURE YOU:   Understand these instructions.  Will watch your condition.  Will get help right away if you are not doing well or get worse. Document Released: 04/22/2008 Document Revised: 08/25/2013 Document Reviewed: 06/17/2013 [facility] Patient Information 2015 [place], [place]. This information is not intended to replace advice given to you by your health care provider. Make sure you discuss any questions you have with your health care provider.  Fall Prevention and Home Safety Falls cause injuries and can affect all age groups. It is possible to prevent falls.  HOW TO PREVENT FALLS  Wear shoes with rubber soles that do not have an opening for your toes.  Keep the inside and outside of your house well lit.  Use night lights throughout your home.  Remove clutter from floors.  Clean up floor spills.  Remove throw rugs or fasten them to the floor with carpet tape.  Do not place electrical cords across pathways.  Put grab bars by your tub, shower, and toilet. Do not use towel bars as grab bars.  Put  handrails on both sides of the stairway. Fix loose handrails.  Do not climb on stools or stepladders, if possible.  Do not wax your floors.  Repair uneven or unsafe sidewalks, walkways, or stairs.  Keep items you use a lot within reach.  Be aware of pets.  Keep emergency numbers next to the telephone.  Put smoke detectors in your home and near bedrooms. Ask your doctor what other things you can do to prevent falls. Document Released: 08/31/2009 Document Revised: 05/05/2012 Document Reviewed: 02/04/2012 [facility] Patient Information 2015 [place], [place]. This information is not intended to replace advice given to you by your health care provider. Make sure you discuss any questions you have with your health care provider. The CT scan of your head and face  are normal.    No fractures

## 2015-06-29 ENCOUNTER — Encounter: Payer: Self-pay | Admitting: *Deleted

## 2015-07-13 ENCOUNTER — Ambulatory Visit (INDEPENDENT_AMBULATORY_CARE_PROVIDER_SITE_OTHER): Payer: Medicare Other | Admitting: Podiatry

## 2015-07-13 DIAGNOSIS — M79676 Pain in unspecified toe(s): Secondary | ICD-10-CM | POA: Diagnosis not present

## 2015-07-13 DIAGNOSIS — B351 Tinea unguium: Secondary | ICD-10-CM | POA: Diagnosis not present

## 2015-07-13 NOTE — Progress Notes (Signed)
Patient ID: Shelby Reese, female   DOB: January 26, 1935, 80 y.o.   MRN: [id number] Complaint:  Visit Type: Patient returns to my office for continued preventative foot care services. Complaint: Patient states" my nails have grown long and thick and become painful to walk and wear shoes" . The patient presents for preventative foot care services. No changes to ROS  Podiatric Exam: Vascular: dorsalis pedis and posterior tibial pulses are palpable bilateral. Capillary return is immediate. Temperature gradient is WNL. Skin turgor WNL  Sensorium: Normal Semmes Weinstein monofilament test. Normal tactile sensation bilaterally. Nail Exam: Pt has thick disfigured discolored nails with subungual debris noted bilateral entire nail hallux through fifth toenails Ulcer Exam: There is no evidence of ulcer or pre-ulcerative changes or infection. Orthopedic Exam: Muscle tone and strength are WNL. No limitations in general ROM. No crepitus or effusions noted. Foot type and digits show no abnormalities. Bony prominences are unremarkable. Skin: No Porokeratosis. No infection or ulcers  Diagnosis:  Onychomycosis, , Pain in right toe, pain in left toes  Treatment & Plan Procedures and Treatment: Consent by patient was obtained for treatment procedures. The patient understood the discussion of treatment and procedures well. All questions were answered thoroughly reviewed. Debridement of mycotic and hypertrophic toenails, 1 through 5 bilateral and clearing of subungual debris. No ulceration, no infection noted.  Return Visit-Office Procedure: Patient instructed to return to the office for a follow up visit 3 months for continued evaluation and treatment.

## 2015-08-07 ENCOUNTER — Encounter: Payer: Self-pay | Admitting: Internal Medicine

## 2015-09-21 ENCOUNTER — Encounter: Payer: Self-pay | Admitting: Podiatry

## 2015-09-21 ENCOUNTER — Ambulatory Visit (INDEPENDENT_AMBULATORY_CARE_PROVIDER_SITE_OTHER): Payer: Medicare Other | Admitting: Podiatry

## 2015-09-21 DIAGNOSIS — B351 Tinea unguium: Secondary | ICD-10-CM

## 2015-09-21 DIAGNOSIS — M79676 Pain in unspecified toe(s): Secondary | ICD-10-CM

## 2015-09-21 NOTE — Progress Notes (Signed)
Patient ID: Shelby Reese, female   DOB: 12/27/1934, 79 y.o.   MRN: [id number] Complaint:  Visit Type: Patient returns to my office for continued preventative foot care services. Complaint: Patient states" my nails have grown long and thick and become painful to walk and wear shoes" . The patient presents for preventative foot care services. No changes to ROS  Podiatric Exam: Vascular: dorsalis pedis and posterior tibial pulses are palpable bilateral. Capillary return is immediate. Temperature gradient is WNL. Skin turgor WNL  Sensorium: Normal Semmes Weinstein monofilament test. Normal tactile sensation bilaterally. Nail Exam: Pt has thick disfigured discolored nails with subungual debris noted bilateral entire nail hallux through fifth toenails Ulcer Exam: There is no evidence of ulcer or pre-ulcerative changes or infection. Orthopedic Exam: Muscle tone and strength are WNL. No limitations in general ROM. No crepitus or effusions noted. Foot type and digits show no abnormalities. Bony prominences are unremarkable. Skin: No Porokeratosis. No infection or ulcers  Diagnosis:  Onychomycosis, , Pain in right toe, pain in left toes  Treatment & Plan Procedures and Treatment: Consent by patient was obtained for treatment procedures. The patient understood the discussion of treatment and procedures well. All questions were answered thoroughly reviewed. Debridement of mycotic and hypertrophic toenails, 1 through 5 bilateral and clearing of subungual debris. No ulceration, no infection noted.  Return Visit-Office Procedure: Patient instructed to return to the office for a follow up visit 3 months for continued evaluation and treatment. 

## 2015-09-27 DIAGNOSIS — D721 Eosinophilia: Secondary | ICD-10-CM | POA: Diagnosis not present

## 2015-09-27 DIAGNOSIS — Z23 Encounter for immunization: Secondary | ICD-10-CM | POA: Diagnosis not present

## 2015-09-27 DIAGNOSIS — N183 Chronic kidney disease, stage 3 (moderate): Secondary | ICD-10-CM | POA: Diagnosis not present

## 2015-09-27 DIAGNOSIS — L309 Dermatitis, unspecified: Secondary | ICD-10-CM | POA: Diagnosis not present

## 2015-11-29 ENCOUNTER — Encounter: Payer: Self-pay | Admitting: Podiatry

## 2015-11-29 ENCOUNTER — Ambulatory Visit (INDEPENDENT_AMBULATORY_CARE_PROVIDER_SITE_OTHER): Payer: Medicare Other | Admitting: Podiatry

## 2015-11-29 DIAGNOSIS — M79676 Pain in unspecified toe(s): Secondary | ICD-10-CM | POA: Diagnosis not present

## 2015-11-29 DIAGNOSIS — B351 Tinea unguium: Secondary | ICD-10-CM

## 2015-11-29 NOTE — Progress Notes (Signed)
Patient ID: Shelby Reese, female   DOB: 12/31/1934, 80 y.o.   MRN: [id number] Complaint:  Visit Type: Patient returns to my office for continued preventative foot care services. Complaint: Patient states" my nails have grown long and thick and become painful to walk and wear shoes" . The patient presents for preventative foot care services. No changes to ROS  Podiatric Exam: Vascular: dorsalis pedis and posterior tibial pulses are palpable bilateral. Capillary return is immediate. Temperature gradient is WNL. Skin turgor WNL  Sensorium: Normal Semmes Weinstein monofilament test. Normal tactile sensation bilaterally. Nail Exam: Pt has thick disfigured discolored nails with subungual debris noted bilateral entire nail hallux through fifth toenails Ulcer Exam: There is no evidence of ulcer or pre-ulcerative changes or infection. Orthopedic Exam: Muscle tone and strength are WNL. No limitations in general ROM. No crepitus or effusions noted. Foot type and digits show no abnormalities. Bony prominences are unremarkable. Skin: No Porokeratosis. No infection or ulcers  Diagnosis:  Onychomycosis, , Pain in right toe, pain in left toes  Treatment & Plan Procedures and Treatment: Consent by patient was obtained for treatment procedures. The patient understood the discussion of treatment and procedures well. All questions were answered thoroughly reviewed. Debridement of mycotic and hypertrophic toenails, 1 through 5 bilateral and clearing of subungual debris. No ulceration, no infection noted.  Return Visit-Office Procedure: Patient instructed to return to the office for a follow up visit 10 weeks  for continued evaluation and treatment.   [name] DPM 

## 2016-02-07 ENCOUNTER — Ambulatory Visit (INDEPENDENT_AMBULATORY_CARE_PROVIDER_SITE_OTHER): Payer: Medicare Other | Admitting: Podiatry

## 2016-02-07 DIAGNOSIS — M79676 Pain in unspecified toe(s): Secondary | ICD-10-CM

## 2016-02-07 DIAGNOSIS — B351 Tinea unguium: Secondary | ICD-10-CM

## 2016-02-07 NOTE — Progress Notes (Signed)
Patient ID: Shelby Reese, female   DOB: 03/11/1935, 80 y.o.   MRN: [id number] Complaint:  Visit Type: Patient returns to my office for continued preventative foot care services. Complaint: Patient states" my nails have grown long and thick and become painful to walk and wear shoes" . The patient presents for preventative foot care services. No changes to ROS  Podiatric Exam: Vascular: dorsalis pedis and posterior tibial pulses are palpable bilateral. Capillary return is immediate. Temperature gradient is WNL. Skin turgor WNL  Sensorium: Normal Semmes Weinstein monofilament test. Normal tactile sensation bilaterally. Nail Exam: Pt has thick disfigured discolored nails with subungual debris noted bilateral entire nail hallux through fifth toenails Ulcer Exam: There is no evidence of ulcer or pre-ulcerative changes or infection. Orthopedic Exam: Muscle tone and strength are WNL. No limitations in general ROM. No crepitus or effusions noted. Foot type and digits show no abnormalities. Bony prominences are unremarkable. Skin: No Porokeratosis. No infection or ulcers  Diagnosis:  Onychomycosis, , Pain in right toe, pain in left toes  Treatment & Plan Procedures and Treatment: Consent by patient was obtained for treatment procedures. The patient understood the discussion of treatment and procedures well. All questions were answered thoroughly reviewed. Debridement of mycotic and hypertrophic toenails, 1 through 5 bilateral and clearing of subungual debris. No ulceration, no infection noted.  Return Visit-Office Procedure: Patient instructed to return to the office for a follow up visit 10 weeks  for continued evaluation and treatment.   Alin Hutchins DPM 

## 2016-02-28 DIAGNOSIS — E039 Hypothyroidism, unspecified: Secondary | ICD-10-CM | POA: Diagnosis not present

## 2016-02-28 DIAGNOSIS — Z0000 Encounter for general adult medical examination without abnormal findings: Secondary | ICD-10-CM | POA: Diagnosis not present

## 2016-02-28 DIAGNOSIS — I10 Essential (primary) hypertension: Secondary | ICD-10-CM | POA: Diagnosis not present

## 2016-02-29 DIAGNOSIS — I10 Essential (primary) hypertension: Secondary | ICD-10-CM | POA: Diagnosis not present

## 2016-02-29 DIAGNOSIS — E039 Hypothyroidism, unspecified: Secondary | ICD-10-CM | POA: Diagnosis not present

## 2016-02-29 DIAGNOSIS — N390 Urinary tract infection, site not specified: Secondary | ICD-10-CM | POA: Diagnosis not present

## 2016-03-12 DIAGNOSIS — L309 Dermatitis, unspecified: Secondary | ICD-10-CM | POA: Diagnosis not present

## 2016-03-12 DIAGNOSIS — I10 Essential (primary) hypertension: Secondary | ICD-10-CM | POA: Diagnosis not present

## 2016-03-12 DIAGNOSIS — N183 Chronic kidney disease, stage 3 (moderate): Secondary | ICD-10-CM | POA: Diagnosis not present

## 2016-03-12 DIAGNOSIS — K219 Gastro-esophageal reflux disease without esophagitis: Secondary | ICD-10-CM | POA: Diagnosis not present

## 2016-03-12 DIAGNOSIS — Z1212 Encounter for screening for malignant neoplasm of rectum: Secondary | ICD-10-CM | POA: Diagnosis not present

## 2016-04-17 ENCOUNTER — Encounter: Payer: Self-pay | Admitting: Podiatry

## 2016-04-17 ENCOUNTER — Ambulatory Visit (INDEPENDENT_AMBULATORY_CARE_PROVIDER_SITE_OTHER): Payer: Medicare Other | Admitting: Podiatry

## 2016-04-17 DIAGNOSIS — M79676 Pain in unspecified toe(s): Secondary | ICD-10-CM

## 2016-04-17 DIAGNOSIS — B351 Tinea unguium: Secondary | ICD-10-CM | POA: Diagnosis not present

## 2016-04-17 NOTE — Progress Notes (Signed)
Patient ID: Edwin Dada, female   DOB: 19-Jun-1935, 81 y.o.   MRN: [id number] Complaint:  Visit Type: Patient returns to my office for continued preventative foot care services. Complaint: Patient states" my nails have grown long and thick and become painful to walk and wear shoes" . The patient presents for preventative foot care services. No changes to ROS  Podiatric Exam: Vascular: dorsalis pedis and posterior tibial pulses are palpable bilateral. Capillary return is immediate. Temperature gradient is WNL. Skin turgor WNL  Sensorium: Normal Semmes Weinstein monofilament test. Normal tactile sensation bilaterally. Nail Exam: Pt has thick disfigured discolored nails with subungual debris noted bilateral entire nail hallux through fifth toenails Ulcer Exam: There is no evidence of ulcer or pre-ulcerative changes or infection. Orthopedic Exam: Muscle tone and strength are WNL. No limitations in general ROM. No crepitus or effusions noted. Foot type and digits show no abnormalities. Bony prominences are unremarkable. Skin: No Porokeratosis. No infection or ulcers  Diagnosis:  Onychomycosis, , Pain in right toe, pain in left toes  Treatment & Plan Procedures and Treatment: Consent by patient was obtained for treatment procedures. The patient understood the discussion of treatment and procedures well. All questions were answered thoroughly reviewed. Debridement of mycotic and hypertrophic toenails, 1 through 5 bilateral and clearing of subungual debris. No ulceration, no infection noted.  Return Visit-Office Procedure: Patient instructed to return to the office for a follow up visit 10 weeks  for continued evaluation and treatment.   Gardiner Barefoot DPM

## 2016-06-12 DIAGNOSIS — H10503 Unspecified blepharoconjunctivitis, bilateral: Secondary | ICD-10-CM | POA: Diagnosis not present

## 2016-06-26 ENCOUNTER — Ambulatory Visit (INDEPENDENT_AMBULATORY_CARE_PROVIDER_SITE_OTHER): Payer: Medicare Other | Admitting: Podiatry

## 2016-06-26 DIAGNOSIS — B351 Tinea unguium: Secondary | ICD-10-CM

## 2016-06-26 DIAGNOSIS — M79676 Pain in unspecified toe(s): Secondary | ICD-10-CM

## 2016-06-26 NOTE — Progress Notes (Signed)
Patient ID: Shelby Reese, female   DOB: December 13, 1934, 81 y.o.   MRN: [id number] Complaint:  Visit Type: Patient returns to my office for continued preventative foot care services. Complaint: Patient states" my nails have grown long and thick and become painful to walk and wear shoes" . The patient presents for preventative foot care services. No changes to ROS  Podiatric Exam: Vascular: dorsalis pedis and posterior tibial pulses are palpable bilateral. Capillary return is immediate. Temperature gradient is WNL. Skin turgor WNL  Sensorium: Normal Semmes Weinstein monofilament test. Normal tactile sensation bilaterally. Nail Exam: Pt has thick disfigured discolored nails with subungual debris noted bilateral entire nail hallux through fifth toenails Ulcer Exam: There is no evidence of ulcer or pre-ulcerative changes or infection. Orthopedic Exam: Muscle tone and strength are WNL. No limitations in general ROM. No crepitus or effusions noted. Foot type and digits show no abnormalities. Bony prominences are unremarkable. Skin: No Porokeratosis. No infection or ulcers  Diagnosis:  Onychomycosis, , Pain in right toe, pain in left toes  Treatment & Plan Procedures and Treatment: Consent by patient was obtained for treatment procedures. The patient understood the discussion of treatment and procedures well. All questions were answered thoroughly reviewed. Debridement of mycotic and hypertrophic toenails, 1 through 5 bilateral and clearing of subungual debris. No ulceration, no infection noted.  Return Visit-Office Procedure: Patient instructed to return to the office for a follow up visit 10 weeks  for continued evaluation and treatment.   Gardiner Barefoot DPM

## 2016-07-15 ENCOUNTER — Other Ambulatory Visit: Payer: Self-pay

## 2016-07-17 ENCOUNTER — Ambulatory Visit: Payer: Medicare Other | Admitting: Podiatry

## 2016-09-04 ENCOUNTER — Ambulatory Visit: Payer: Medicare Other | Admitting: Podiatry

## 2016-09-05 DIAGNOSIS — D649 Anemia, unspecified: Secondary | ICD-10-CM | POA: Diagnosis not present

## 2016-09-05 DIAGNOSIS — Z7982 Long term (current) use of aspirin: Secondary | ICD-10-CM | POA: Diagnosis not present

## 2016-09-05 DIAGNOSIS — N183 Chronic kidney disease, stage 3 (moderate): Secondary | ICD-10-CM | POA: Diagnosis not present

## 2016-09-11 DIAGNOSIS — D485 Neoplasm of uncertain behavior of skin: Secondary | ICD-10-CM | POA: Diagnosis not present

## 2016-09-11 DIAGNOSIS — I10 Essential (primary) hypertension: Secondary | ICD-10-CM | POA: Diagnosis not present

## 2016-09-11 DIAGNOSIS — Z23 Encounter for immunization: Secondary | ICD-10-CM | POA: Diagnosis not present

## 2016-09-11 DIAGNOSIS — D649 Anemia, unspecified: Secondary | ICD-10-CM | POA: Diagnosis not present

## 2016-09-12 ENCOUNTER — Ambulatory Visit (INDEPENDENT_AMBULATORY_CARE_PROVIDER_SITE_OTHER): Payer: Medicare Other | Admitting: Podiatry

## 2016-09-12 ENCOUNTER — Encounter: Payer: Self-pay | Admitting: Podiatry

## 2016-09-12 VITALS — Ht 63.0 in | Wt 246.0 lb

## 2016-09-12 DIAGNOSIS — B351 Tinea unguium: Secondary | ICD-10-CM | POA: Diagnosis not present

## 2016-09-12 DIAGNOSIS — M79676 Pain in unspecified toe(s): Secondary | ICD-10-CM

## 2016-09-12 NOTE — Progress Notes (Signed)
Patient ID: Shelby Reese, female   DOB: 1935/07/09, 81 y.o.   MRN: [id number] Complaint:  Visit Type: Patient returns to my office for continued preventative foot care services. Complaint: Patient states" my nails have grown long and thick and become painful to walk and wear shoes" . The patient presents for preventative foot care services. No changes to ROS  Podiatric Exam: Vascular: dorsalis pedis and posterior tibial pulses are palpable bilateral. Capillary return is immediate. Temperature gradient is WNL. Skin turgor WNL  Sensorium: Normal Semmes Weinstein monofilament test. Normal tactile sensation bilaterally. Nail Exam: Pt has thick disfigured discolored nails with subungual debris noted bilateral entire nail hallux through fifth toenails Ulcer Exam: There is no evidence of ulcer or pre-ulcerative changes or infection. Orthopedic Exam: Muscle tone and strength are WNL. No limitations in general ROM. No crepitus or effusions noted. Foot type and digits show no abnormalities. Bony prominences are unremarkable. Skin: No Porokeratosis. No infection or ulcers  Diagnosis:  Onychomycosis, , Pain in right toe, pain in left toes  Treatment & Plan Procedures and Treatment: Consent by patient was obtained for treatment procedures. The patient understood the discussion of treatment and procedures well. All questions were answered thoroughly reviewed. Debridement of mycotic and hypertrophic toenails, 1 through 5 bilateral and clearing of subungual debris. No ulceration, no infection noted.  Return Visit-Office Procedure: Patient instructed to return to the office for a follow up visit 10 weeks  for continued evaluation and treatment.   Gardiner Barefoot DPM

## 2016-10-18 DIAGNOSIS — D649 Anemia, unspecified: Secondary | ICD-10-CM | POA: Diagnosis not present

## 2016-10-21 DIAGNOSIS — D649 Anemia, unspecified: Secondary | ICD-10-CM | POA: Diagnosis not present

## 2016-11-20 ENCOUNTER — Encounter: Payer: Self-pay | Admitting: Podiatry

## 2016-11-20 ENCOUNTER — Ambulatory Visit (INDEPENDENT_AMBULATORY_CARE_PROVIDER_SITE_OTHER): Payer: Medicare Other | Admitting: Podiatry

## 2016-11-20 VITALS — Ht 63.0 in | Wt 243.0 lb

## 2016-11-20 DIAGNOSIS — B351 Tinea unguium: Secondary | ICD-10-CM

## 2016-11-20 DIAGNOSIS — M79676 Pain in unspecified toe(s): Secondary | ICD-10-CM

## 2016-11-20 NOTE — Progress Notes (Signed)
Patient ID: Avynn A Kirkendall, female   DOB: 10/28/1935, 81 y.o.   MRN: [id number] Complaint:  Visit Type: Patient returns to my office for continued preventative foot care services. Complaint: Patient states" my nails have grown long and thick and become painful to walk and wear shoes" . The patient presents for preventative foot care services. No changes to ROS  Podiatric Exam: Vascular: dorsalis pedis and posterior tibial pulses are palpable bilateral. Capillary return is immediate. Temperature gradient is WNL. Skin turgor WNL  Sensorium: Normal Semmes Weinstein monofilament test. Normal tactile sensation bilaterally. Nail Exam: Pt has thick disfigured discolored nails with subungual debris noted bilateral entire nail hallux through fifth toenails Ulcer Exam: There is no evidence of ulcer or pre-ulcerative changes or infection. Orthopedic Exam: Muscle tone and strength are WNL. No limitations in general ROM. No crepitus or effusions noted. Foot type and digits show no abnormalities. Bony prominences are unremarkable. Skin: No Porokeratosis. No infection or ulcers  Diagnosis:  Onychomycosis, , Pain in right toe, pain in left toes  Treatment & Plan Procedures and Treatment: Consent by patient was obtained for treatment procedures. The patient understood the discussion of treatment and procedures well. All questions were answered thoroughly reviewed. Debridement of mycotic and hypertrophic toenails, 1 through 5 bilateral and clearing of subungual debris. No ulceration, no infection noted.  Return Visit-Office Procedure: Patient instructed to return to the office for a follow up visit 10 weeks  for continued evaluation and treatment.   Domanique Luckett DPM 

## 2016-11-21 DIAGNOSIS — D649 Anemia, unspecified: Secondary | ICD-10-CM | POA: Diagnosis not present

## 2016-12-19 DIAGNOSIS — D649 Anemia, unspecified: Secondary | ICD-10-CM | POA: Diagnosis not present

## 2017-01-29 ENCOUNTER — Encounter: Payer: Self-pay | Admitting: Podiatry

## 2017-01-29 ENCOUNTER — Ambulatory Visit (INDEPENDENT_AMBULATORY_CARE_PROVIDER_SITE_OTHER): Payer: Medicare Other | Admitting: Podiatry

## 2017-01-29 VITALS — Ht 63.0 in | Wt 243.0 lb

## 2017-01-29 DIAGNOSIS — M79676 Pain in unspecified toe(s): Secondary | ICD-10-CM

## 2017-01-29 DIAGNOSIS — B351 Tinea unguium: Secondary | ICD-10-CM

## 2017-01-29 NOTE — Progress Notes (Signed)
Patient ID: Shelby Reese, female   DOB: 1935-06-04, 82 y.o.   MRN: [id number] Complaint:  Visit Type: Patient returns to my office for continued preventative foot care services. Complaint: Patient states" my nails have grown long and thick and become painful to walk and wear shoes" . The patient presents for preventative foot care services. No changes to ROS  Podiatric Exam: Vascular: dorsalis pedis and posterior tibial pulses are palpable bilateral. Capillary return is immediate. Temperature gradient is WNL. Skin turgor WNL  Sensorium: Normal Semmes Weinstein monofilament test. Normal tactile sensation bilaterally. Nail Exam: Pt has thick disfigured discolored nails with subungual debris noted bilateral entire nail hallux through fifth toenails Ulcer Exam: There is no evidence of ulcer or pre-ulcerative changes or infection. Orthopedic Exam: Muscle tone and strength are WNL. No limitations in general ROM. No crepitus or effusions noted. Foot type and digits show no abnormalities. Bony prominences are unremarkable. Skin: No Porokeratosis. No infection or ulcers  Diagnosis:  Onychomycosis, , Pain in right toe, pain in left toes  Treatment & Plan Procedures and Treatment: Consent by patient was obtained for treatment procedures. The patient understood the discussion of treatment and procedures well. All questions were answered thoroughly reviewed. Debridement of mycotic and hypertrophic toenails, 1 through 5 bilateral and clearing of subungual debris. No ulceration, no infection noted.  Return Visit-Office Procedure: Patient instructed to return to the office for a follow up visit 10 weeks  for continued evaluation and treatment.   Gardiner Barefoot DPM

## 2017-02-18 DIAGNOSIS — D649 Anemia, unspecified: Secondary | ICD-10-CM | POA: Diagnosis not present

## 2017-03-12 DIAGNOSIS — I10 Essential (primary) hypertension: Secondary | ICD-10-CM | POA: Diagnosis not present

## 2017-03-12 DIAGNOSIS — Z0000 Encounter for general adult medical examination without abnormal findings: Secondary | ICD-10-CM | POA: Diagnosis not present

## 2017-03-12 DIAGNOSIS — E7800 Pure hypercholesterolemia, unspecified: Secondary | ICD-10-CM | POA: Diagnosis not present

## 2017-03-12 DIAGNOSIS — E559 Vitamin D deficiency, unspecified: Secondary | ICD-10-CM | POA: Diagnosis not present

## 2017-03-18 DIAGNOSIS — E039 Hypothyroidism, unspecified: Secondary | ICD-10-CM | POA: Diagnosis not present

## 2017-03-18 DIAGNOSIS — M7989 Other specified soft tissue disorders: Secondary | ICD-10-CM | POA: Diagnosis not present

## 2017-03-18 DIAGNOSIS — Z23 Encounter for immunization: Secondary | ICD-10-CM | POA: Diagnosis not present

## 2017-03-18 DIAGNOSIS — S63242A Subluxation of distal interphalangeal joint of right middle finger, initial encounter: Secondary | ICD-10-CM | POA: Diagnosis not present

## 2017-03-18 DIAGNOSIS — N3020 Other chronic cystitis without hematuria: Secondary | ICD-10-CM | POA: Diagnosis not present

## 2017-03-18 DIAGNOSIS — I890 Lymphedema, not elsewhere classified: Secondary | ICD-10-CM | POA: Diagnosis not present

## 2017-03-18 DIAGNOSIS — R600 Localized edema: Secondary | ICD-10-CM | POA: Diagnosis not present

## 2017-03-18 DIAGNOSIS — M659 Synovitis and tenosynovitis, unspecified: Secondary | ICD-10-CM | POA: Diagnosis not present

## 2017-04-09 ENCOUNTER — Ambulatory Visit (INDEPENDENT_AMBULATORY_CARE_PROVIDER_SITE_OTHER): Payer: Medicare Other | Admitting: Podiatry

## 2017-04-09 DIAGNOSIS — B351 Tinea unguium: Secondary | ICD-10-CM

## 2017-04-09 DIAGNOSIS — M79676 Pain in unspecified toe(s): Secondary | ICD-10-CM

## 2017-04-09 NOTE — Progress Notes (Signed)
Patient ID: Shelby Reese, female   DOB: 01/28/1935, 82 y.o.   MRN: [id number] Complaint:  Visit Type: Patient returns to my office for continued preventative foot care services. Complaint: Patient states" my nails have grown long and thick and become painful to walk and wear shoes" . The patient presents for preventative foot care services. No changes to ROS  Podiatric Exam: Vascular: dorsalis pedis and posterior tibial pulses are palpable bilateral. Capillary return is immediate. Temperature gradient is WNL. Skin turgor WNL  Sensorium: Normal Semmes Weinstein monofilament test. Normal tactile sensation bilaterally. Nail Exam: Pt has thick disfigured discolored nails with subungual debris noted bilateral entire nail hallux through fifth toenails Ulcer Exam: There is no evidence of ulcer or pre-ulcerative changes or infection. Orthopedic Exam: Muscle tone and strength are WNL. No limitations in general ROM. No crepitus or effusions noted. Foot type and digits show no abnormalities. Bony prominences are unremarkable. Skin: No Porokeratosis. No infection or ulcers  Diagnosis:  Onychomycosis, , Pain in right toe, pain in left toes  Treatment & Plan Procedures and Treatment: Consent by patient was obtained for treatment procedures. The patient understood the discussion of treatment and procedures well. All questions were answered thoroughly reviewed. Debridement of mycotic and hypertrophic toenails, 1 through 5 bilateral and clearing of subungual debris. No ulceration, no infection noted.  Return Visit-Office Procedure: Patient instructed to return to the office for a follow up visit 10 weeks  for continued evaluation and treatment.   Gardiner Barefoot DPM

## 2017-04-15 DIAGNOSIS — M7989 Other specified soft tissue disorders: Secondary | ICD-10-CM | POA: Diagnosis not present

## 2017-04-15 DIAGNOSIS — D8989 Other specified disorders involving the immune mechanism, not elsewhere classified: Secondary | ICD-10-CM | POA: Diagnosis not present

## 2017-04-15 DIAGNOSIS — M1990 Unspecified osteoarthritis, unspecified site: Secondary | ICD-10-CM | POA: Diagnosis not present

## 2017-04-15 DIAGNOSIS — I872 Venous insufficiency (chronic) (peripheral): Secondary | ICD-10-CM | POA: Diagnosis not present

## 2017-04-22 DIAGNOSIS — I10 Essential (primary) hypertension: Secondary | ICD-10-CM | POA: Diagnosis not present

## 2017-04-29 DIAGNOSIS — R768 Other specified abnormal immunological findings in serum: Secondary | ICD-10-CM | POA: Diagnosis not present

## 2017-04-29 DIAGNOSIS — M7989 Other specified soft tissue disorders: Secondary | ICD-10-CM | POA: Diagnosis not present

## 2017-04-29 DIAGNOSIS — M1990 Unspecified osteoarthritis, unspecified site: Secondary | ICD-10-CM | POA: Diagnosis not present

## 2017-05-23 ENCOUNTER — Encounter (HOSPITAL_COMMUNITY): Payer: Self-pay

## 2017-05-23 ENCOUNTER — Emergency Department (HOSPITAL_COMMUNITY): Payer: Medicare Other

## 2017-05-23 ENCOUNTER — Emergency Department (HOSPITAL_COMMUNITY)
Admission: EM | Admit: 2017-05-23 | Discharge: 2017-05-23 | Disposition: A | Discharge status: Home / Self Care | Payer: Medicare Other | Attending: Emergency Medicine | Admitting: Emergency Medicine

## 2017-05-23 DIAGNOSIS — S8001XA Contusion of right knee, initial encounter: Secondary | ICD-10-CM | POA: Diagnosis not present

## 2017-05-23 DIAGNOSIS — N182 Chronic kidney disease, stage 2 (mild): Secondary | ICD-10-CM | POA: Diagnosis not present

## 2017-05-23 DIAGNOSIS — Y939 Activity, unspecified: Secondary | ICD-10-CM | POA: Diagnosis not present

## 2017-05-23 DIAGNOSIS — E039 Hypothyroidism, unspecified: Secondary | ICD-10-CM | POA: Insufficient documentation

## 2017-05-23 DIAGNOSIS — Y999 Unspecified external cause status: Secondary | ICD-10-CM | POA: Diagnosis not present

## 2017-05-23 DIAGNOSIS — S8991XA Unspecified injury of right lower leg, initial encounter: Secondary | ICD-10-CM | POA: Diagnosis present

## 2017-05-23 DIAGNOSIS — M25461 Effusion, right knee: Secondary | ICD-10-CM | POA: Diagnosis not present

## 2017-05-23 DIAGNOSIS — Z79899 Other long term (current) drug therapy: Secondary | ICD-10-CM | POA: Diagnosis not present

## 2017-05-23 DIAGNOSIS — Z7982 Long term (current) use of aspirin: Secondary | ICD-10-CM | POA: Diagnosis not present

## 2017-05-23 DIAGNOSIS — Z87891 Personal history of nicotine dependence: Secondary | ICD-10-CM | POA: Insufficient documentation

## 2017-05-23 DIAGNOSIS — Y9289 Other specified places as the place of occurrence of the external cause: Secondary | ICD-10-CM | POA: Diagnosis not present

## 2017-05-23 DIAGNOSIS — W19XXXA Unspecified fall, initial encounter: Secondary | ICD-10-CM | POA: Diagnosis not present

## 2017-05-23 DIAGNOSIS — I129 Hypertensive chronic kidney disease with stage 1 through stage 4 chronic kidney disease, or unspecified chronic kidney disease: Secondary | ICD-10-CM | POA: Insufficient documentation

## 2017-05-23 NOTE — ED Notes (Signed)
ED Provider at bedside. 

## 2017-05-23 NOTE — ED Triage Notes (Signed)
Patient fell while stepping off a curb to get into the car. Patient landed on her right leg, did not hit head. C/o right knee pain.

## 2017-05-23 NOTE — ED Notes (Signed)
DR. Zenia Resides notified of latest BP, pt. [facility] for discharge. Pt. Stated that she is taking BP med at night time and soon as she gets home will take the night dose. Denied chest discomfort but soreness on the affected leg/knee.

## 2017-05-23 NOTE — ED Provider Notes (Signed)
[facility] DEPT Provider Note   CSN: [id number] Arrival date & time: 05/23/17  1609     History   Chief Complaint Chief Complaint  Patient presents with  . Fall  . Knee Pain    Right    HPI Shelby Reese is a 82 y.o. female.  82 year old female who presents with right-sided knee pain after mechanical fall just prior to arrival. Complains of soreness at the right patella. She's able to bend and flex her knee. Denies any hip or ankle discomfort. Pain is better with remaining still and worse with movement. No treatment use prior to arrival.      Past Medical History:  Diagnosis Date  . Allergy   . Anemia   . CKD (chronic kidney disease), stage II   . Hiatal hernia   . Hypertension   . Hypothyroid   . Lymphedema    venous insufficency  . Osteoarthritis   . Varicose veins     Patient Active Problem List   Diagnosis Date Noted  . Thyroid activity decreased 12/27/2014  . Anemia, iron deficiency 12/27/2014  . Hiatal hernia 12/27/2014  . Varicose veins of lower extremities with other complications 46/27/0350    Past Surgical History:  Procedure Laterality Date  . KNEE ARTHROSCOPY Left    menisectomy  . TONSILLECTOMY      OB History    No data available       Home Medications    Prior to Admission medications   Medication Sig Start Date End Date Taking? Authorizing Provider  acetaminophen (TYLENOL) 500 MG tablet Take 1,000 mg by mouth daily.    [provider]  aspirin 81 MG tablet Take 81 mg by mouth daily.    [provider]  azithromycin (ZITHROMAX) 250 MG tablet Take 2 tabs po now then 1 tab po daily 12/25/14   Le, Thao P, DO  benazepril (LOTENSIN) 40 MG tablet Take 40 mg by mouth daily. 03/26/13   [provider]  Calcium Carbonate-Vitamin D (CALCIUM 600+D) 600-400 MG-UNIT per tablet Take 1 tablet by mouth daily.    [provider]  cetirizine (ZYRTEC) 10 MG tablet Take 10 mg by mouth every 6 (six) hours.      [provider]  clobetasol (TEMOVATE) 0.05 % external solution Apply 1 application topically as directed. 04/14/15   [provider]  clobetasol cream (TEMOVATE) 0.93 % Apply 1 application topically 2 (two) times daily as needed. For itching. 05/26/15   [provider]  diltiazem (TIAZAC) 120 MG 24 hr capsule Take 120 mg by mouth daily. 01/29/13   [provider]  diphenhydrAMINE (BENADRYL) 25 mg capsule Take 50 mg by mouth every 8 (eight) hours as needed (for sneezing).     [provider]  famotidine (PEPCID) 10 MG tablet Take 10 mg by mouth daily.    [provider]  ibuprofen (ADVIL,MOTRIN) 200 MG tablet Take 400 mg by mouth daily.    [provider]  MAGNESIUM CARBONATE PO Take 125 mg by mouth.     [provider]  Multiple Vitamin (MULTIVITAMIN) tablet Take 1 tablet by mouth daily.    [provider]  NATURAL PSYLLIUM FIBER PO Take 1 capsule by mouth daily.    [provider]  OVER THE COUNTER MEDICATION Take 1 tablet by mouth 2 (two) times daily. Osteo Biflex    [provider]  OVER THE COUNTER MEDICATION Take 1 tablet by mouth daily. Magnesium 133 mg with 5 mg  cheated zinc    [provider]  pseudoephedrine (SUDAFED) 30 MG tablet Take 30 mg by mouth every 6 (six) hours.     [provider]  SYNTHROID 175 MCG tablet  09/20/15   [provider]  vitamin C (ASCORBIC ACID) 500 MG tablet Take 500 mg by mouth daily.    [provider]    Family History Family History  Problem Relation Age of Onset  . Stroke Mother   . Varicose Veins Mother   . Cancer Father        prostate  . Stroke Sister   . Heart disease Sister   . Varicose Veins Sister   . Stroke Maternal Grandmother   . Varicose Veins Sister     Social History Social History  Substance Use Topics  . Smoking status: Former Smoker    Packs/day: 0.50    Years: 10.00    Quit date: 11/19/1979  .  Smokeless tobacco: Never Used  . Alcohol use 8.4 oz/week    14 Glasses of wine per week     Allergies   Bactrim [sulfamethoxazole-trimethoprim]; Ciprofloxacin; Diovan [valsartan]; Nitrofuran derivatives; Penicillin g; Penicillins; and Other   Review of Systems Review of Systems  All other systems reviewed and are negative.    Physical Exam Updated Vital Signs BP (!) 219/83 (BP Location: Left Arm)   Pulse 86   Temp 97.7 F (36.5 C) (Oral)   Resp 18   Ht 1.651 m (5\' 5" )   Wt 106.6 kg (235 lb)   SpO2 99%   BMI 39.11 kg/m   Physical Exam  Constitutional: She is oriented to person, place, and time. She appears well-developed and well-nourished.  Non-toxic appearance. No distress.  HENT:  Head: Normocephalic and atraumatic.  Eyes: Conjunctivae, EOM and lids are normal. Pupils are equal, round, and reactive to light.  Neck: Normal range of motion. Neck supple. No tracheal deviation present. No thyroid mass present.  Cardiovascular: Normal rate, regular rhythm and normal heart sounds.  Exam reveals no gallop.   No murmur heard. Pulmonary/Chest: Effort normal and breath sounds normal. No stridor. No respiratory distress. She has no decreased breath sounds. She has no wheezes. She has no rhonchi. She has no rales.  Abdominal: Soft. Normal appearance and bowel sounds are normal. She exhibits no distension. There is no tenderness. There is no rebound and no CVA tenderness.  Musculoskeletal: Normal range of motion. She exhibits no edema or tenderness.       Right knee: She exhibits swelling and ecchymosis. She exhibits normal range of motion.       Legs: Neurological: She is alert and oriented to person, place, and time. She has normal strength. No cranial nerve deficit or sensory deficit. GCS eye subscore is 4. GCS verbal subscore is 5. GCS motor subscore is 6.  Skin: Skin is warm and dry. No abrasion and no rash noted.  Psychiatric: She has a normal mood and affect. Her speech is  normal and behavior is normal.  Nursing note and vitals reviewed.    ED Treatments / Results  Labs (all labs ordered are listed, but only abnormal results are displayed) Labs Reviewed - No data to display  EKG  EKG Interpretation None       Radiology No results found.  Procedures Procedures (including critical care time)  Medications Ordered in ED Medications - No data to display   Initial Impression / Assessment and Plan / ED Course  I have reviewed the triage  vital signs and the nursing notes.  Pertinent labs & imaging results that were available during my care of the patient were reviewed by me and considered in my medical decision making (see chart for details).     Knee x-ray without acute findings. Patient offered knee immobilizer but has deferred. Will give orthopedic referral  Final Clinical Impressions(s) / ED Diagnoses   Final diagnoses:  None    New Prescriptions New Prescriptions   No medications on file     Lacretia Leigh, MD 05/23/17 2037

## 2017-06-18 ENCOUNTER — Ambulatory Visit: Payer: Medicare Other | Admitting: Podiatry

## 2017-07-09 DIAGNOSIS — L97912 Non-pressure chronic ulcer of unspecified part of right lower leg with fat layer exposed: Secondary | ICD-10-CM | POA: Diagnosis not present

## 2017-07-09 DIAGNOSIS — D649 Anemia, unspecified: Secondary | ICD-10-CM | POA: Diagnosis not present

## 2017-07-09 DIAGNOSIS — Z8619 Personal history of other infectious and parasitic diseases: Secondary | ICD-10-CM | POA: Diagnosis not present

## 2017-07-11 ENCOUNTER — Ambulatory Visit (INDEPENDENT_AMBULATORY_CARE_PROVIDER_SITE_OTHER): Payer: Medicare Other | Admitting: Family

## 2017-07-11 DIAGNOSIS — M810 Age-related osteoporosis without current pathological fracture: Secondary | ICD-10-CM

## 2017-07-11 DIAGNOSIS — I10 Essential (primary) hypertension: Secondary | ICD-10-CM | POA: Diagnosis not present

## 2017-07-11 DIAGNOSIS — H269 Unspecified cataract: Secondary | ICD-10-CM | POA: Diagnosis not present

## 2017-07-11 DIAGNOSIS — S8001XS Contusion of right knee, sequela: Secondary | ICD-10-CM | POA: Diagnosis not present

## 2017-07-11 DIAGNOSIS — L97813 Non-pressure chronic ulcer of other part of right lower leg with necrosis of muscle: Secondary | ICD-10-CM

## 2017-07-11 DIAGNOSIS — I70201 Unspecified atherosclerosis of native arteries of extremities, right leg: Secondary | ICD-10-CM | POA: Insufficient documentation

## 2017-07-11 MED ORDER — DOXYCYCLINE HYCLATE 100 MG PO TABS: 100.0000 mg | ORAL_TABLET | Freq: Two times a day (BID) | ORAL | 0 refills | Status: DC

## 2017-07-11 NOTE — Progress Notes (Signed)
Office Visit Note   Patient: Shelby Reese           Date of Birth: 01-11-1935           MRN: [id number] Visit Date: 07/11/2017              Requested by: [name], MD [address] [place], [place] [address] PCP: [name], MD  No chief complaint on file.     HPI: The patient is a 82 year old woman who presents today for evaluation of an open wound to her right knee. She had a fall on July 6 and presented to the emergency room. Initial radiographs are negative for fracture. Was told she had a hematoma. She states that this lasted for about a month at which point it opened up. she states she has had an ulceration to the knee for several weeks that she's been treating with silver gel a prescription she had left over and non-adherent dressings. Reports has had copious drainage. States it is serous and bloody.  Was seen by her primary care doctor on August 22 was referred to Dr. Sharol Given for evaluation.  States is not currently on antibiotics.  No fevers or chills. She does state her temperature has been 98.4 which "high for her"  Assessment & Plan: Visit Diagnoses:  1. Skin ulcer of right knee with necrosis of muscle ([place])   2. Traumatic hematoma of right knee, sequela   3. Atherosclerosis of artery of right lower extremity ([place])   4. Hypertension, unspecified type   5. Osteoporosis, unspecified osteoporosis type, unspecified pathological fracture presence   6. Cataract of both eyes, unspecified cataract type     Plan: We'll start her on a doxycycline course. Discussed strict return precautions she may call the on call or present to the emergency room for worsening over the weekend. Discussed that likely we will need to proceed with irrigation and debridement of the right knee ulcer. discussed that we will call her on Monday to discuss the plan and set her up for surgery. Continue with daily wound cleansing and dry dressing changes.  Follow-Up Instructions:  Return in about 3 days (around 07/14/2017).   Ortho Exam  Patient is alert, oriented, no adenopathy, well-dressed, normal affect, normal respiratory effort. On examination of the right lower extremity she has pitting edema with cellulitis of the right lower extremity pain there is no weeping or ulceration to her shin or lower leg there is an open ulcer to her anterior knee this is covered with eschar distally this has opened up and is well 7 mm deep there is exposed muscle and fat. there is exudative tissue. No purulence today however there is a foul odor.  Imaging: No results found. No images are attached to the encounter.  Labs: Lab Results  Component Value Date   LABORGA No Salmonella,Shigella,Campylobacter,Yersinia,or 01/01/2015   LABORGA No E.coli 0157:H7 isolated. 01/01/2015    Orders:  No orders of the defined types were placed in this encounter.  Meds ordered this encounter  Medications  . doxycycline (VIBRA-TABS) 100 MG tablet    Sig: Take 1 tablet (100 mg total) by mouth 2 (two) times daily.    Dispense:  60 tablet    Refill:  0     Procedures: No procedures performed  Clinical Data: No additional findings.  ROS:  All other systems negative, except as noted in the HPI. Review of Systems  Constitutional: Negative for chills and fever.  Cardiovascular: Positive for  leg swelling.  Skin: Positive for color change and wound.  Neurological: Negative for weakness and numbness.    Objective: Vital Signs: There were no vitals taken for this visit.  Specialty Comments:  No specialty comments available.  PMFS History: Patient Active Problem List   Diagnosis Date Noted  . Atherosclerosis of artery of right lower extremity ([place]) 07/11/2017  . Hypertension 07/11/2017  . Osteoporosis 07/11/2017  . Cataracts, bilateral 07/11/2017  . Thyroid activity decreased 12/27/2014  . Anemia, iron deficiency 12/27/2014  . Hiatal hernia 12/27/2014  . Varicose veins of lower  extremities with other complications 56/38/9373   Past Medical History:  Diagnosis Date  . Allergy   . Anemia   . CKD (chronic kidney disease), stage II   . Hiatal hernia   . Hypertension   . Hypothyroid   . Lymphedema    venous insufficency  . Osteoarthritis   . Varicose veins     Family History  Problem Relation Age of Onset  . Stroke Mother   . Varicose Veins Mother   . Cancer Father        prostate  . Stroke Sister   . Heart disease Sister   . Varicose Veins Sister   . Stroke Maternal Grandmother   . Varicose Veins Sister     Past Surgical History:  Procedure Laterality Date  . KNEE ARTHROSCOPY Left    menisectomy  . TONSILLECTOMY     Social History   Occupational History  .      retired Marine scientist.    Social History Main Topics  . Smoking status: Former Smoker    Packs/day: 0.50    Years: 10.00    Quit date: 11/19/1979  . Smokeless tobacco: Never Used  . Alcohol use 8.4 oz/week    14 Glasses of wine per week  . Drug use: No  . Sexual activity: Not on file

## 2017-07-14 ENCOUNTER — Telehealth (INDEPENDENT_AMBULATORY_CARE_PROVIDER_SITE_OTHER): Payer: Self-pay | Admitting: Radiology

## 2017-07-14 ENCOUNTER — Ambulatory Visit (INDEPENDENT_AMBULATORY_CARE_PROVIDER_SITE_OTHER): Payer: Medicare Other | Admitting: Orthopedic Surgery

## 2017-07-14 MED ORDER — SILVER SULFADIAZINE 1 % EX CREA: 1.0000 "application " | TOPICAL_CREAM | Freq: Every day | CUTANEOUS | 0 refills | Status: DC

## 2017-07-14 MED ORDER — "GAUZE PADS & DRESSINGS 4""X4-1/2"" PADS": 2.0000 [IU] | MEDICATED_PAD | Freq: Four times a day (QID) | 2 refills | Status: DC

## 2017-07-14 MED ORDER — KERLIX GAUZE ROLL LARGE MISC: 1.0000 [IU] | Freq: Four times a day (QID) | 1 refills | Status: DC

## 2017-07-14 NOTE — Telephone Encounter (Signed)
Patient called and was very upset that she was not able to see Dr Sharol Given today, He got called into an emergency at the hospital.  I offered to have her see Junie Panning and she says no, then refuses to make an appt with Sharol Given.  She demands we call Dr Pennie Banter office to advise them what is going on.  I offered to have her go to ER and then have Sharol Given see her there and she declined.  She says the knee wound is draining horribly and she is going through copious amounts to dressings, and she needs something done today.  IC Dr Pennie Banter office and s/w Gay Filler about this, Gay Filler will call patient.  I advised Gay Filler that I can put her on schedule tomorrow at 1230 with Sharol Given, she will let me know if patient wants this appt.  Patient also asks for silvadene Rx sent to pharm.  I will send this in per protocol.  FYI only

## 2017-07-14 NOTE — Telephone Encounter (Signed)
Dressing supplies also sent to pharm at pt request.

## 2017-07-15 ENCOUNTER — Telehealth (INDEPENDENT_AMBULATORY_CARE_PROVIDER_SITE_OTHER): Payer: Self-pay | Admitting: Radiology

## 2017-07-15 ENCOUNTER — Encounter (INDEPENDENT_AMBULATORY_CARE_PROVIDER_SITE_OTHER): Payer: Self-pay | Admitting: Orthopedic Surgery

## 2017-07-15 ENCOUNTER — Ambulatory Visit (INDEPENDENT_AMBULATORY_CARE_PROVIDER_SITE_OTHER): Payer: Medicare Other | Admitting: Orthopedic Surgery

## 2017-07-15 DIAGNOSIS — L97813 Non-pressure chronic ulcer of other part of right lower leg with necrosis of muscle: Secondary | ICD-10-CM

## 2017-07-15 DIAGNOSIS — L97919 Non-pressure chronic ulcer of unspecified part of right lower leg with unspecified severity: Secondary | ICD-10-CM | POA: Insufficient documentation

## 2017-07-15 DIAGNOSIS — I70201 Unspecified atherosclerosis of native arteries of extremities, right leg: Secondary | ICD-10-CM | POA: Diagnosis not present

## 2017-07-15 DIAGNOSIS — I87333 Chronic venous hypertension (idiopathic) with ulcer and inflammation of bilateral lower extremity: Secondary | ICD-10-CM | POA: Insufficient documentation

## 2017-07-15 DIAGNOSIS — L97929 Non-pressure chronic ulcer of unspecified part of left lower leg with unspecified severity: Secondary | ICD-10-CM

## 2017-07-15 NOTE — Telephone Encounter (Signed)
Just FYI patient wants to make you aware of labs hemoglobin 9.2, rbc 3.4, hematocrit 30.8.

## 2017-07-15 NOTE — Progress Notes (Signed)
Office Visit Note   Patient: Shelby Reese           Date of Birth: May 21, 1935           MRN: [id number] Visit Date: 07/15/2017              Requested by: [name], MD [address] [place], [place] [address] PCP: [name], MD  Chief Complaint  Patient presents with  . Right Leg - Follow-up    Necrotic wound anterior knee      HPI: Patient is a 82 year old woman who is status post a fall on 05/23/2017. She states she went to the emergency room was therefore over 5 hours and was told there was no fracture and she was discharged. Patient states she's had progressive ischemic changes to the wound she was started on doxycycline on her first office visit here she states she has a family history of venous insufficiency. 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 Skin ulcer of right knee with necrosis of muscle ([place])   2. Atherosclerosis of artery of right lower extremity ([place])   3. Idiopathic chronic venous hypertension of both lower extremities with ulcer and inflammation ([facility])     Plan: Discussed with the patient she is at risk of loss of limb with the large ulcer over the knee. Discussed that she currently has an active infection with massive venous insufficiency. Also discussed that this may require a muscle flap rotation to cover the wound if we cannot get this to heal with more conservative measures. Discussed that we will try serial debridements with surgery on Friday followed by placement of the instillation wound VAC with follow-up on Wednesday to either repeat the instillation wound VAC or to proceed with split-thickness skin graft. Discussed that we will need to control the venous stasis swelling in her leg with compression and discussed the importance of compression stockings. Patient states that she cannot get stockings on. We will plan on discharged with home health nursing with wound VAC dressing changes at  home.  Follow-Up Instructions: Return in about 2 weeks (around 07/29/2017).   Ortho Exam  Patient is alert, oriented, no adenopathy, well-dressed, normal affect, normal respiratory effort. Examination patient ambulates in a wheelchair. The ulcer is necrotic full-thickness down to fascia with drainage. The wound measures 4 x 16 cm and is 2 cm deep. Patient has massive brawny edema in both lower extremities with venous stasis insufficiency there is pitting edema up beyond the knee. Patient does not have a palpable pulse however with Doppler she has a strong triphasic posterior tibial pulse but does not have a dopplerable dorsalis pedis pulse possibly due to the swelling.  Imaging: No results found.     Labs: Lab Results  Component Value Date   LABORGA No Salmonella,Shigella,Campylobacter,Yersinia,or 01/01/2015   LABORGA No E.coli 0157:H7 isolated. 01/01/2015    Orders:  No orders of the defined types were placed in this encounter.  No orders of the defined types were placed in this encounter.    Procedures: No procedures performed  Clinical Data: No additional findings.  ROS:  All other systems negative, except as noted in the HPI. Review of Systems  Objective: Vital Signs: There were no vitals taken for this visit.  Specialty Comments:  No specialty comments available.  PMFS History: Patient Active Problem List   Diagnosis Date Noted  . Idiopathic chronic venous hypertension of both lower extremities  with ulcer and inflammation ([facility]) 07/15/2017  . Skin ulcer of right knee with necrosis of muscle ([place]) 07/15/2017  . Atherosclerosis of artery of right lower extremity ([place]) 07/11/2017  . Hypertension 07/11/2017  . Osteoporosis 07/11/2017  . Cataracts, bilateral 07/11/2017  . Thyroid activity decreased 12/27/2014  . Anemia, iron deficiency 12/27/2014  . Hiatal hernia 12/27/2014  . Varicose veins of lower extremities with other complications 81/77/1165   Past  Medical History:  Diagnosis Date  . Allergy   . Anemia   . CKD (chronic kidney disease), stage II   . Hiatal hernia   . Hypertension   . Hypothyroid   . Lymphedema    venous insufficency  . Osteoarthritis   . Varicose veins     Family History  Problem Relation Age of Onset  . Stroke Mother   . Varicose Veins Mother   . Cancer Father        prostate  . Stroke Sister   . Heart disease Sister   . Varicose Veins Sister   . Stroke Maternal Grandmother   . Varicose Veins Sister     Past Surgical History:  Procedure Laterality Date  . KNEE ARTHROSCOPY Left    menisectomy  . TONSILLECTOMY     Social History   Occupational History  .      retired Marine scientist.    Social History Main Topics  . Smoking status: Former Smoker    Packs/day: 0.50    Years: 10.00    Quit date: 11/19/1979  . Smokeless tobacco: Never Used  . Alcohol use 8.4 oz/week    14 Glasses of wine per week  . Drug use: No  . Sexual activity: Not on file

## 2017-07-16 ENCOUNTER — Other Ambulatory Visit (INDEPENDENT_AMBULATORY_CARE_PROVIDER_SITE_OTHER): Payer: Self-pay | Admitting: Family

## 2017-07-16 ENCOUNTER — Encounter (HOSPITAL_COMMUNITY): Payer: Self-pay | Admitting: *Deleted

## 2017-07-16 NOTE — Progress Notes (Signed)
Pt denies any acute cardiopulmonary issues. Pt denies being under the care of a cardiologist. Pt denies having a stress test, echo and cardiac cath. Pt denies having a chest x ary and EKG within the last year. Pt had a CBC recently at [facility]; records requested. Pt made aware to stop taking vitamins, fish oil, Sudafed and herbal medications. Do not take any NSAIDs ie: Ibuprofen, Advil, Naproxen (Aleve), Advil, Motrin, BC and Goody Powder. Dr. Tobias Alexander, Anesthesiologist, stated that pt can have a cup of black coffee (pt will not use cream or sugar) to prevent migraine headache and nausea; coffee must be finished by 5:45 am for a 7:45 am arrival (surgery at 10:14am. Pt verbalized understanding of all pre-op instructions.

## 2017-07-17 ENCOUNTER — Encounter (HOSPITAL_COMMUNITY): Payer: Self-pay | Admitting: Emergency Medicine

## 2017-07-17 ENCOUNTER — Telehealth (INDEPENDENT_AMBULATORY_CARE_PROVIDER_SITE_OTHER): Payer: Self-pay | Admitting: Orthopedic Surgery

## 2017-07-17 ENCOUNTER — Inpatient Hospital Stay (HOSPITAL_COMMUNITY)
Admission: EM | Admit: 2017-07-17 | Discharge: 2017-07-29 | DRG: 574 | Disposition: A | Discharge status: Home Health | Payer: Medicare Other | Attending: Family Medicine | Admitting: Family Medicine

## 2017-07-17 DIAGNOSIS — S81001A Unspecified open wound, right knee, initial encounter: Secondary | ICD-10-CM

## 2017-07-17 DIAGNOSIS — L89899 Pressure ulcer of other site, unspecified stage: Secondary | ICD-10-CM | POA: Diagnosis not present

## 2017-07-17 DIAGNOSIS — I83893 Varicose veins of bilateral lower extremities with other complications: Secondary | ICD-10-CM | POA: Diagnosis present

## 2017-07-17 DIAGNOSIS — I82612 Acute embolism and thrombosis of superficial veins of left upper extremity: Secondary | ICD-10-CM | POA: Diagnosis not present

## 2017-07-17 DIAGNOSIS — D631 Anemia in chronic kidney disease: Secondary | ICD-10-CM | POA: Diagnosis present

## 2017-07-17 DIAGNOSIS — D508 Other iron deficiency anemias: Secondary | ICD-10-CM | POA: Diagnosis not present

## 2017-07-17 DIAGNOSIS — Z6839 Body mass index (BMI) 39.0-39.9, adult: Secondary | ICD-10-CM

## 2017-07-17 DIAGNOSIS — L97818 Non-pressure chronic ulcer of other part of right lower leg with other specified severity: Secondary | ICD-10-CM | POA: Diagnosis not present

## 2017-07-17 DIAGNOSIS — Z7982 Long term (current) use of aspirin: Secondary | ICD-10-CM | POA: Diagnosis not present

## 2017-07-17 DIAGNOSIS — D509 Iron deficiency anemia, unspecified: Secondary | ICD-10-CM | POA: Diagnosis present

## 2017-07-17 DIAGNOSIS — K449 Diaphragmatic hernia without obstruction or gangrene: Secondary | ICD-10-CM | POA: Diagnosis not present

## 2017-07-17 DIAGNOSIS — E039 Hypothyroidism, unspecified: Secondary | ICD-10-CM | POA: Diagnosis present

## 2017-07-17 DIAGNOSIS — Z791 Long term (current) use of non-steroidal anti-inflammatories (NSAID): Secondary | ICD-10-CM

## 2017-07-17 DIAGNOSIS — E669 Obesity, unspecified: Secondary | ICD-10-CM | POA: Diagnosis present

## 2017-07-17 DIAGNOSIS — S81801A Unspecified open wound, right lower leg, initial encounter: Secondary | ICD-10-CM

## 2017-07-17 DIAGNOSIS — I70201 Unspecified atherosclerosis of native arteries of extremities, right leg: Secondary | ICD-10-CM | POA: Diagnosis present

## 2017-07-17 DIAGNOSIS — N183 Chronic kidney disease, stage 3 (moderate): Secondary | ICD-10-CM | POA: Diagnosis not present

## 2017-07-17 DIAGNOSIS — I129 Hypertensive chronic kidney disease with stage 1 through stage 4 chronic kidney disease, or unspecified chronic kidney disease: Secondary | ICD-10-CM | POA: Diagnosis present

## 2017-07-17 DIAGNOSIS — S91001A Unspecified open wound, right ankle, initial encounter: Secondary | ICD-10-CM

## 2017-07-17 DIAGNOSIS — Z87891 Personal history of nicotine dependence: Secondary | ICD-10-CM | POA: Diagnosis not present

## 2017-07-17 DIAGNOSIS — I10 Essential (primary) hypertension: Secondary | ICD-10-CM

## 2017-07-17 DIAGNOSIS — L0390 Cellulitis, unspecified: Secondary | ICD-10-CM

## 2017-07-17 DIAGNOSIS — E875 Hyperkalemia: Secondary | ICD-10-CM | POA: Diagnosis not present

## 2017-07-17 DIAGNOSIS — L97919 Non-pressure chronic ulcer of unspecified part of right lower leg with unspecified severity: Secondary | ICD-10-CM | POA: Diagnosis not present

## 2017-07-17 DIAGNOSIS — L89611 Pressure ulcer of right heel, stage 1: Secondary | ICD-10-CM | POA: Diagnosis not present

## 2017-07-17 DIAGNOSIS — Z8249 Family history of ischemic heart disease and other diseases of the circulatory system: Secondary | ICD-10-CM

## 2017-07-17 DIAGNOSIS — I872 Venous insufficiency (chronic) (peripheral): Secondary | ICD-10-CM | POA: Diagnosis present

## 2017-07-17 DIAGNOSIS — M25561 Pain in right knee: Secondary | ICD-10-CM | POA: Diagnosis not present

## 2017-07-17 DIAGNOSIS — L03115 Cellulitis of right lower limb: Secondary | ICD-10-CM

## 2017-07-17 DIAGNOSIS — T801XXA Vascular complications following infusion, transfusion and therapeutic injection, initial encounter: Secondary | ICD-10-CM | POA: Diagnosis not present

## 2017-07-17 DIAGNOSIS — L97813 Non-pressure chronic ulcer of other part of right lower leg with necrosis of muscle: Secondary | ICD-10-CM | POA: Diagnosis not present

## 2017-07-17 DIAGNOSIS — L8990 Pressure ulcer of unspecified site, unspecified stage: Secondary | ICD-10-CM | POA: Insufficient documentation

## 2017-07-17 DIAGNOSIS — K219 Gastro-esophageal reflux disease without esophagitis: Secondary | ICD-10-CM | POA: Diagnosis not present

## 2017-07-17 LAB — COMPREHENSIVE METABOLIC PANEL
ALT: 14 U/L (ref 14–54)
ANION GAP: 11 (ref 5–15)
AST: 19 U/L (ref 15–41)
Albumin: 3.7 g/dL (ref 3.5–5.0)
Alkaline Phosphatase: 56 U/L (ref 38–126)
BILIRUBIN TOTAL: 0.4 mg/dL (ref 0.3–1.2)
BUN: 34 mg/dL — AB (ref 6–20)
CHLORIDE: 102 mmol/L (ref 101–111)
CO2: 21 mmol/L — ABNORMAL LOW (ref 22–32)
Calcium: 9.6 mg/dL (ref 8.9–10.3)
Creatinine, Ser: 1.49 mg/dL — ABNORMAL HIGH (ref 0.44–1.00)
GFR calc Af Amer: 37 mL/min — ABNORMAL LOW (ref >60)
GFR, EST NON AFRICAN AMERICAN: 32 mL/min — AB (ref >60)
Glucose, Bld: 139 mg/dL — ABNORMAL HIGH (ref 65–99)
POTASSIUM: 4.1 mmol/L (ref 3.5–5.1)
Sodium: 134 mmol/L — ABNORMAL LOW (ref 135–145)
TOTAL PROTEIN: 7.7 g/dL (ref 6.5–8.1)

## 2017-07-17 LAB — URINALYSIS, ROUTINE W REFLEX MICROSCOPIC
Bacteria, UA: NONE SEEN
Bilirubin Urine: NEGATIVE
GLUCOSE, UA: NEGATIVE mg/dL
Hgb urine dipstick: NEGATIVE
Ketones, ur: NEGATIVE mg/dL
NITRITE: NEGATIVE
PH: 5 (ref 5.0–8.0)
PROTEIN: NEGATIVE mg/dL
Specific Gravity, Urine: 1.015 (ref 1.005–1.030)

## 2017-07-17 LAB — CBC WITH DIFFERENTIAL/PLATELET
BASOS ABS: 0 10*3/uL (ref 0.0–0.1)
Basophils Relative: 0 %
EOS PCT: 4 %
Eosinophils Absolute: 0.5 10*3/uL (ref 0.0–0.7)
HCT: 30.9 % — ABNORMAL LOW (ref 36.0–46.0)
HEMOGLOBIN: 9.5 g/dL — AB (ref 12.0–15.0)
LYMPHS ABS: 1.5 10*3/uL (ref 0.7–4.0)
LYMPHS PCT: 13 %
MCH: 26.9 pg (ref 26.0–34.0)
MCHC: 30.7 g/dL (ref 30.0–36.0)
MCV: 87.5 fL (ref 78.0–100.0)
Monocytes Absolute: 0.9 10*3/uL (ref 0.1–1.0)
Monocytes Relative: 8 %
NEUTROS ABS: 8.6 10*3/uL — AB (ref 1.7–7.7)
Neutrophils Relative %: 75 %
PLATELETS: 373 10*3/uL (ref 150–400)
RBC: 3.53 MIL/uL — AB (ref 3.87–5.11)
RDW: 14.7 % (ref 11.5–15.5)
WBC: 11.4 10*3/uL — AB (ref 4.0–10.5)

## 2017-07-17 LAB — I-STAT CG4 LACTIC ACID, ED: Lactic Acid, Venous: 0.83 mmol/L (ref 0.5–1.9)

## 2017-07-17 MED ORDER — METRONIDAZOLE IN NACL 5-0.79 MG/ML-% IV SOLN
500.0000 mg | Freq: Once | INTRAVENOUS | Status: AC
  Administered 2017-07-17: 500 mg via INTRAVENOUS
  Filled 2017-07-17: qty 100

## 2017-07-17 MED ORDER — FAMOTIDINE 20 MG PO TABS
40.0000 mg | ORAL_TABLET | Freq: Every day | ORAL | Status: DC
  Administered 2017-07-18 – 2017-07-19 (×2): 40 mg via ORAL
  Filled 2017-07-17 (×2): qty 2

## 2017-07-17 MED ORDER — VANCOMYCIN HCL 10 G IV SOLR
2000.0000 mg | Freq: Once | INTRAVENOUS | Status: AC
  Administered 2017-07-17: 2000 mg via INTRAVENOUS
  Filled 2017-07-17: qty 2000

## 2017-07-17 MED ORDER — DEXTROSE 5 % IV SOLN
2.0000 g | Freq: Once | INTRAVENOUS | Status: AC
  Administered 2017-07-17: 2 g via INTRAVENOUS
  Filled 2017-07-17: qty 2

## 2017-07-17 NOTE — Telephone Encounter (Signed)
I called patient today he cut she called earlier stating that she felt like the infection was getting worse and we recommended that she go to the emergency room. I had not heard from the emergency room so I called the patient directly and she states that she will be going to [facility] in several hours to be admitted for IV antibiotics. I discussed the importance of this being treated surgically and that we have operating time tomorrow at [facility] and have recommended the patient start on the IV antibiotics and then we will have her transferred to [facility] for surgery. Patient repeatedly states that she has lost confidence in May with my ability to treat her wound I discussed the importance that I stressed in the office that we need to proceed with surgical intervention when patient stated that she felt like this could be treated topically. I stressed the importance in the office that without treating this surgically she is at risk of loss of limb. Patient stated that she thought I meant that she was going to lose her leg and that I was not taking her seriously. I again stressed the importance that I want to care for her I want to make sure that she has the proper care and I will follow-up when she gets the emergency room to make sure that the proper care is provided. I again stressed the importance of proceeding with surgery tomorrow for surgical debridement and stated that we have several surgeries set up in follow-up to ensure the proper care is provided.

## 2017-07-17 NOTE — Progress Notes (Signed)
A consult was received from an ED physician for vancomycin and cefepime per pharmacy dosing.  The patient's profile has been reviewed for ht/wt/allergies/indication/available labs.   A one time order has been placed for cefepime 2 gm and vancomycin 2 gm.  Further antibiotics/pharmacy consults should be ordered by admitting physician if indicated.                       Thank you, Eudelia Bunch, Pharm.D. [phone] 07/17/2017 8:58 PM

## 2017-07-17 NOTE — ED Triage Notes (Signed)
Patient reports that she wound on right knee after falling 2 weeks ago. Patient reports that has been having pus drainage 2 weeks as well and tired of waiting on her PCP "trying to get her in to see someone for it".

## 2017-07-17 NOTE — ED Notes (Signed)
Both sets of blood cultures drawn prior to antibiotic administration.  

## 2017-07-17 NOTE — ED Provider Notes (Signed)
[facility] DEPT Provider Note   CSN: [id number] Arrival date & time: 07/17/17  1754     History   Chief Complaint Chief Complaint  Patient presents with  . Wound Infection    HPI Shelby Reese is a 82 y.o. female.  HPI  82 year old female with past medical history is blankly and peripheral vascular disease and known venous insufficiency here with open wound to right knee. The patient states that she fell in early July. She is evaluated at that time and had a negative plain film. She states she had significant swelling at the time. Since then, she states her knee pain has improved. However, it has remained persistently swollen. Approximately 2 weeks ago, she noticed an area open along her right anterior knee. The area has now progressively worsened. It is draining foul-smelling, purulent material. She was seen by Dr. Sharol Given with orthopedics several days ago and is scheduled to undergo excision of necrotic tissue and wound cleaning tomorrow. However, the patient states her redness has spread up from her knee and she presents for IV antibiotics.. She reports an aching, throbbing pain over her knee that is worse with any movement.  Past Medical History:  Diagnosis Date  . Allergy   . Anemia   . CKD (chronic kidney disease), stage II   . Fibromyalgia   . GERD (gastroesophageal reflux disease)   . Hiatal hernia   . Hypertension   . Hypothyroid   . Lymphedema    venous insufficency  . Osteoarthritis   . Peripheral vascular disease ([place])   . Ulcer of knee ([place])    right  . Urinary incontinence   . Varicose veins   . Venous insufficiency     Patient Active Problem List   Diagnosis Date Noted  . Cellulitis 07/17/2017  . Idiopathic chronic venous hypertension of both lower extremities with ulcer and inflammation ([place]) 07/15/2017  . Skin ulcer of right knee with necrosis of muscle ([place]) 07/15/2017  . Atherosclerosis of artery of right lower extremity ([place]) 07/11/2017  .  Essential hypertension 07/11/2017  . Osteoporosis 07/11/2017  . Cataracts, bilateral 07/11/2017  . Hypothyroidism, acquired 12/27/2014  . Anemia, iron deficiency 12/27/2014  . Hiatal hernia 12/27/2014  . Varicose veins of lower extremities with other complications 29/56/2130    Past Surgical History:  Procedure Laterality Date  . CATARACT EXTRACTION W/ INTRAOCULAR LENS  IMPLANT, BILATERAL    . COLONOSCOPY    . KNEE ARTHROSCOPY Left    menisectomy  . MULTIPLE TOOTH EXTRACTIONS    . TONSILLECTOMY      OB History    No data available       Home Medications    Prior to Admission medications   Medication Sig Start Date End Date Taking? Authorizing Provider  acetaminophen (TYLENOL) 500 MG tablet Take 1,000 mg by mouth daily.   Yes [provider]  aspirin EC 81 MG tablet Take 81 mg by mouth daily with breakfast.   Yes [provider]  benazepril (LOTENSIN) 40 MG tablet Take 40 mg by mouth daily with breakfast.  03/26/13  Yes [provider]  Calcium Carbonate-Vitamin D (CALCIUM 600+D) 600-400 MG-UNIT per tablet Take 1 tablet by mouth daily at 3 pm.    Yes [provider]  cetirizine (ZYRTEC) 10 MG tablet Take 10 mg by mouth at bedtime.    Yes [provider]  diltiazem (TIAZAC) 120 MG 24 hr capsule Take 120 mg by mouth daily with breakfast.  01/29/13  Yes  [provider]  diphenhydrAMINE (BENADRYL) 25 mg capsule Take 50 mg by mouth every 6 (six) hours.    Yes [provider]  famotidine (PEPCID) 40 MG tablet Take 40 mg by mouth daily at 3 pm. 1600   Yes [provider]  ferrous sulfate 325 (65 FE) MG tablet Take 325 mg by mouth at bedtime.   Yes [provider]  ibuprofen (ADVIL,MOTRIN) 200 MG tablet Take 400 mg by mouth daily.   Yes [provider]  Magnesium Oxide (MAG-200 PO) Take 200 mg by mouth daily at 3 pm.   Yes [provider]  Multiple Vitamin (MULTIVITAMIN WITH MINERALS) TABS  tablet Take 1 tablet by mouth daily.   Yes [provider]  NATURAL PSYLLIUM FIBER PO Take 1 capsule by mouth daily.   Yes [provider]  pseudoephedrine (SUDAFED) 30 MG tablet Take 30 mg by mouth every 6 (six) hours.    Yes [provider]  silver sulfADIAZINE (SILVADENE) 1 % cream Apply 1 application topically daily. 07/14/17  Yes Suzan Slick, NP  SYNTHROID 175 MCG tablet Take 175 mcg by mouth daily before breakfast.  09/20/15  Yes [provider]  vitamin C (ASCORBIC ACID) 500 MG tablet Take 500 mg by mouth daily.   Yes [provider]  Gauze Pads & Dressings (KERLIX GAUZE ROLL LARGE) MISC 1 Units by Does not apply route 4 (four) times daily. 07/14/17   Suzan Slick, NP  Gauze Pads & Dressings 4"X4-1/2" PADS 2 Units by Does not apply route 4 (four) times daily. 07/14/17   Suzan Slick, NP    Family History Family History  Problem Relation Age of Onset  . Stroke Mother   . Varicose Veins Mother   . Cancer Father        prostate  . Stroke Sister   . Heart disease Sister   . Varicose Veins Sister   . Stroke Maternal Grandmother   . Varicose Veins Sister     Social History Social History  Substance Use Topics  . Smoking status: Former Smoker    Packs/day: 0.50    Years: 10.00    Quit date: 11/19/1979  . Smokeless tobacco: Never Used  . Alcohol use 8.4 oz/week    14 Glasses of wine per week     Comment: 2 glasses of wine with dinner     Allergies   Bactrim [sulfamethoxazole-trimethoprim]; Ciprofloxacin; Diovan [valsartan]; Food; Latex; Nitrofuran derivatives; Penicillins; Sulfa antibiotics; and Other   Review of Systems Review of Systems  Constitutional: Positive for fatigue. Negative for chills and fever.  HENT: Negative for congestion, rhinorrhea and sore throat.   Eyes: Negative for visual disturbance.  Respiratory: Negative for cough, shortness of breath and wheezing.   Cardiovascular: Negative for chest pain and leg  swelling.  Gastrointestinal: Negative for abdominal pain, diarrhea, nausea and vomiting.  Genitourinary: Negative for dysuria, flank pain, vaginal bleeding and vaginal discharge.  Musculoskeletal: Positive for gait problem. Negative for neck pain.  Skin: Positive for wound. Negative for rash.  Allergic/Immunologic: Negative for immunocompromised state.  Neurological: Negative for syncope and headaches.  Hematological: Does not bruise/bleed easily.  All other systems reviewed and are negative.    Physical Exam Updated Vital Signs BP (!) 159/74 (BP Location: Left Arm)   Pulse 98   Temp 99.2 F (37.3 C) (Oral)   Resp 16   Ht 5\' 5"  (1.651 m)   Wt 106.6 kg (235 lb)   SpO2 98%  BMI 39.11 kg/m   Physical Exam  Constitutional: She is oriented to person, place, and time. She appears well-developed and well-nourished. No distress.  HENT:  Head: Normocephalic and atraumatic.  Eyes: Conjunctivae are normal.  Neck: Neck supple.  Cardiovascular: Normal rate, regular rhythm and normal heart sounds.  Exam reveals no friction rub.   No murmur heard. Pulmonary/Chest: Effort normal and breath sounds normal. No respiratory distress. She has no wheezes. She has no rales.  Abdominal: She exhibits no distension.  Musculoskeletal: She exhibits no edema.  Neurological: She is alert and oriented to person, place, and time. She exhibits normal muscle tone.  Skin: Skin is warm. Capillary refill takes less than 2 seconds.  Psychiatric: She has a normal mood and affect.  Nursing note and vitals reviewed.   LOWER EXTREMITY EXAM: RIGHT  INSPECTION & PALPATION: Large, gaping wound to right knee/proximal thigh with exposed subcutaneous tissues. There is significant fibrinous, necrotic exudates throughout wound base with foul smelling purulence. Moderate surrounding erythema and warmth.   SENSORY: sensation is intact to light touch in:  Superficial peroneal nerve distribution (over dorsum of  foot) Deep peroneal nerve distribution (over first dorsal web space) Sural nerve distribution (over lateral aspect 5th metatarsal) Saphenous nerve distribution (over medial instep)  MOTOR:  + Motor EHL (great toe dorsiflexion) + FHL (great toe plantar flexion)  + TA (ankle dorsiflexion)  + GSC (ankle plantar flexion)  VASCULAR: 1+ dorsalis pedis and posterior tibialis pulses Capillary refill < 2 sec, toes warm and well-perfused  COMPARTMENTS: Soft, warm, well-perfused No pain with passive extension No parethesias           ED Treatments / Results  Labs (all labs ordered are listed, but only abnormal results are displayed) Labs Reviewed  COMPREHENSIVE METABOLIC PANEL - Abnormal; Notable for the following:       Result Value   Sodium 134 (*)    CO2 21 (*)    Glucose, Bld 139 (*)    BUN 34 (*)    Creatinine, Ser 1.49 (*)    GFR calc non Af Amer 32 (*)    GFR calc Af Amer 37 (*)    All other components within normal limits  CBC WITH DIFFERENTIAL/PLATELET - Abnormal; Notable for the following:    WBC 11.4 (*)    RBC 3.53 (*)    Hemoglobin 9.5 (*)    HCT 30.9 (*)    Neutro Abs 8.6 (*)    All other components within normal limits  URINALYSIS, ROUTINE W REFLEX MICROSCOPIC - Abnormal; Notable for the following:    Leukocytes, UA TRACE (*)    Squamous Epithelial / LPF 0-5 (*)    All other components within normal limits  BASIC METABOLIC PANEL - Abnormal; Notable for the following:    CO2 20 (*)    Glucose, Bld 107 (*)    BUN 26 (*)    Creatinine, Ser 1.20 (*)    GFR calc non Af Amer 42 (*)    GFR calc Af Amer 48 (*)    All other components within normal limits  CBC - Abnormal; Notable for the following:    RBC 3.35 (*)    Hemoglobin 8.8 (*)    HCT 29.5 (*)    MCHC 29.8 (*)    All other components within normal limits  MRSA PCR SCREENING  CULTURE, BLOOD (ROUTINE X 2)  CULTURE, BLOOD (ROUTINE X 2)  I-STAT CG4 LACTIC ACID, ED    EKG  EKG  Interpretation None       Radiology No results found.  Procedures Procedures (including critical care time)  Medications Ordered in ED Medications  aspirin EC tablet 81 mg ( Oral Automatically Held 07/26/17 0800)  famotidine (PEPCID) tablet 40 mg ( Oral Automatically Held 08/02/17 1500)  ferrous sulfate tablet 325 mg ( Oral Automatically Held 07/26/17 2200)  magnesium oxide (MAG-OX) tablet 200 mg ( Oral Automatically Held 07/26/17 1500)  levothyroxine (SYNTHROID, LEVOTHROID) tablet 175 mcg ( Oral Automatically Held 07/26/17 0800)  acetaminophen (TYLENOL) tablet 1,000 mg ( Oral Automatically Held 07/26/17 1000)  benazepril (LOTENSIN) tablet 40 mg ( Oral Automatically Held 07/27/17 0800)  loratadine (CLARITIN) tablet 10 mg ( Oral Automatically Held 07/26/17 1000)  diltiazem (CARDIZEM CD) 24 hr capsule 120 mg ( Oral Automatically Held 07/26/17 0800)  enoxaparin (LOVENOX) injection 40 mg ( Subcutaneous Automatically Held 07/26/17 2200)  ondansetron (ZOFRAN) tablet 4 mg ( Oral See Alternative 07/18/17 1123)    Or  ondansetron (ZOFRAN) injection 4 mg (4 mg Intravenous Given 07/18/17 1123)  acetaminophen (TYLENOL) tablet 650 mg ( Oral MAR Hold 07/18/17 1015)    Or  acetaminophen (TYLENOL) suppository 650 mg ( Rectal MAR Hold 07/18/17 1015)  0.9 %  sodium chloride infusion ( Intravenous New Bag/Given 07/18/17 0354)  cefTRIAXone (ROCEPHIN) 2 g in dextrose 5 % 50 mL IVPB ( Intravenous Automatically Held 07/26/17 0600)  vancomycin (VANCOCIN) 1,250 mg in sodium chloride 0.9 % 250 mL IVPB ( Intravenous Automatically Held 07/26/17 2200)  hydrALAZINE (APRESOLINE) tablet 25 mg ( Oral MAR Hold 07/18/17 1015)  diphenhydrAMINE (BENADRYL) capsule 25 mg ( Oral MAR Hold 07/18/17 1015)  lactated ringers infusion ( Intravenous New Bag/Given 07/18/17 1000)  0.9 % irrigation (POUR BTL) (1,000 mLs Irrigation Given 07/18/17 1047)  metroNIDAZOLE (FLAGYL) IVPB 500 mg (0 mg Intravenous Stopped 07/17/17 2316)  ceFEPIme (MAXIPIME) 2 g in  dextrose 5 % 50 mL IVPB (0 g Intravenous Stopped 07/17/17 2156)  vancomycin (VANCOCIN) 2,000 mg in sodium chloride 0.9 % 500 mL IVPB (0 mg Intravenous Stopped 07/18/17 0131)     Initial Impression / Assessment and Plan / ED Course  I have reviewed the triage vital signs and the nursing notes.  Pertinent labs & imaging results that were available during my care of the patient were reviewed by me and considered in my medical decision making (see chart for details).     82 year old female with known chronic peripheral vascular disease here with large, foul-smelling, purulent wound to right anterior thigh. I suspect the patient had an underlying traumatic hematoma causing overlying skin necrosis, now comp located by secondary bacterial superinfection. She is a very high risk for loss of limb. I discussed with Dr. Sharol Given of orthopedics. Will start the patient on broad-spectrum IV antibiotics and admit to [facility] for excision tomorrow morning. The patient is scheduled for 10:30 AM. She needs to be nothing by mouth at midnight. Otherwise, the patient has mild white count but is otherwise without evidence of systemic or severe sepsis. She has no hypotension.  This note was prepared with assistance of Systems analyst. Occasional wrong-word or sound-a-like substitutions may have occurred due to the inherent limitations of voice recognition software.   Final Clinical Impressions(s) / ED Diagnoses   Final diagnoses:  Cellulitis of leg, right    New Prescriptions Current Discharge Medication List       Duffy Bruce, MD 07/18/17 1124

## 2017-07-17 NOTE — Telephone Encounter (Signed)
Called and spoke with Patient at 2:00 pm today. Patient called office this morning to let us know that she is feeling poorly, pain, drainage and erythema to the knee. Patient states that she has been taking the doxycycline that was called in last Friday to her pharmacy however she feels that this has not been successful in reaching her knee. She is concerned about compromised blood flow to her knee.  Patient is on the schedule for irrigation and debridement of the knee ulcer tomorrow at [facility] with Dr. Sharol Given. Recommended the patient present to the [facility] emergency department for evaluation and treatment. Patient likely will need admission for IV antibiotics. Dr. Sharol Given would like to proceed with surgical debridement tomorrow as scheduled.  Patient expressed that she does not feel confident Dr. Sharol Given. She would like to go to [facility] she prefers this hospital for [facility]. Is requesting the emergency department contact a different surgeon to evaluate her.  Again stressed the importance of reporting emergency department as soon as possible. Patient states she will report to [facility] ED when she is ready.

## 2017-07-17 NOTE — ED Notes (Signed)
[facility] called. 

## 2017-07-17 NOTE — ED Notes (Signed)
Hospitalist at bedside 

## 2017-07-18 ENCOUNTER — Encounter (HOSPITAL_COMMUNITY)
Admission: EM | Disposition: A | Discharge status: Home Health | Payer: Self-pay | Source: Home / Self Care | Attending: Family Medicine

## 2017-07-18 ENCOUNTER — Inpatient Hospital Stay (HOSPITAL_COMMUNITY): Payer: Medicare Other | Admitting: Anesthesiology

## 2017-07-18 ENCOUNTER — Inpatient Hospital Stay: Admission: RE | Admit: 2017-07-18 | Payer: Medicare Other | Source: Ambulatory Visit | Admitting: Orthopedic Surgery

## 2017-07-18 ENCOUNTER — Encounter (HOSPITAL_COMMUNITY): Payer: Self-pay | Admitting: *Deleted

## 2017-07-18 DIAGNOSIS — L03115 Cellulitis of right lower limb: Principal | ICD-10-CM

## 2017-07-18 DIAGNOSIS — S81801A Unspecified open wound, right lower leg, initial encounter: Secondary | ICD-10-CM

## 2017-07-18 DIAGNOSIS — S81001A Unspecified open wound, right knee, initial encounter: Secondary | ICD-10-CM

## 2017-07-18 DIAGNOSIS — L97813 Non-pressure chronic ulcer of other part of right lower leg with necrosis of muscle: Secondary | ICD-10-CM

## 2017-07-18 DIAGNOSIS — S91001A Unspecified open wound, right ankle, initial encounter: Secondary | ICD-10-CM

## 2017-07-18 HISTORY — DX: Venous insufficiency (chronic) (peripheral): I87.2

## 2017-07-18 HISTORY — DX: Fibromyalgia: M79.7

## 2017-07-18 HISTORY — PX: I & D EXTREMITY: SHX5045

## 2017-07-18 HISTORY — DX: Gastro-esophageal reflux disease without esophagitis: K21.9

## 2017-07-18 HISTORY — DX: Non-pressure chronic ulcer of other part of unspecified lower leg with unspecified severity: L97.809

## 2017-07-18 HISTORY — DX: Peripheral vascular disease, unspecified: I73.9

## 2017-07-18 HISTORY — PX: APPLICATION OF WOUND VAC: SHX5189

## 2017-07-18 HISTORY — DX: Unspecified urinary incontinence: R32

## 2017-07-18 LAB — BASIC METABOLIC PANEL
Anion gap: 10 (ref 5–15)
BUN: 26 mg/dL — ABNORMAL HIGH (ref 6–20)
CALCIUM: 9.6 mg/dL (ref 8.9–10.3)
CO2: 20 mmol/L — ABNORMAL LOW (ref 22–32)
Chloride: 107 mmol/L (ref 101–111)
Creatinine, Ser: 1.2 mg/dL — ABNORMAL HIGH (ref 0.44–1.00)
GFR calc Af Amer: 48 mL/min — ABNORMAL LOW (ref >60)
GFR, EST NON AFRICAN AMERICAN: 42 mL/min — AB (ref >60)
GLUCOSE: 107 mg/dL — AB (ref 65–99)
POTASSIUM: 4.3 mmol/L (ref 3.5–5.1)
Sodium: 137 mmol/L (ref 135–145)

## 2017-07-18 LAB — CBC
HEMATOCRIT: 29.5 % — AB (ref 36.0–46.0)
Hemoglobin: 8.8 g/dL — ABNORMAL LOW (ref 12.0–15.0)
MCH: 26.3 pg (ref 26.0–34.0)
MCHC: 29.8 g/dL — AB (ref 30.0–36.0)
MCV: 88.1 fL (ref 78.0–100.0)
Platelets: 332 10*3/uL (ref 150–400)
RBC: 3.35 MIL/uL — ABNORMAL LOW (ref 3.87–5.11)
RDW: 14.8 % (ref 11.5–15.5)
WBC: 10 10*3/uL (ref 4.0–10.5)

## 2017-07-18 LAB — MRSA PCR SCREENING: MRSA by PCR: NEGATIVE

## 2017-07-18 SURGERY — IRRIGATION AND DEBRIDEMENT EXTREMITY
Anesthesia: General | Site: Knee | Laterality: Right

## 2017-07-18 MED ORDER — ACETAMINOPHEN 500 MG PO TABS
1000.0000 mg | ORAL_TABLET | Freq: Every day | ORAL | Status: DC
  Administered 2017-07-21: 1000 mg via ORAL
  Filled 2017-07-18 (×2): qty 2

## 2017-07-18 MED ORDER — BISACODYL 10 MG RE SUPP: 10.0000 mg | Freq: Every day | RECTAL | Status: DC | PRN

## 2017-07-18 MED ORDER — PHENYLEPHRINE 40 MCG/ML (10ML) SYRINGE FOR IV PUSH (FOR BLOOD PRESSURE SUPPORT)
PREFILLED_SYRINGE | INTRAVENOUS | Status: AC
  Filled 2017-07-18: qty 10

## 2017-07-18 MED ORDER — MIDAZOLAM HCL 5 MG/5ML IJ SOLN
INTRAMUSCULAR | Status: DC | PRN
  Administered 2017-07-18 (×2): 1 mg via INTRAVENOUS

## 2017-07-18 MED ORDER — DIPHENHYDRAMINE HCL 25 MG PO CAPS
25.0000 mg | ORAL_CAPSULE | Freq: Four times a day (QID) | ORAL | Status: DC | PRN
  Administered 2017-07-18 – 2017-07-20 (×3): 25 mg via ORAL
  Filled 2017-07-18 (×3): qty 1

## 2017-07-18 MED ORDER — ENOXAPARIN SODIUM 40 MG/0.4ML ~~LOC~~ SOLN
40.0000 mg | SUBCUTANEOUS | Status: DC
  Administered 2017-07-18 – 2017-07-28 (×9): 40 mg via SUBCUTANEOUS
  Filled 2017-07-18 (×11): qty 0.4

## 2017-07-18 MED ORDER — DOCUSATE SODIUM 100 MG PO CAPS
100.0000 mg | ORAL_CAPSULE | Freq: Two times a day (BID) | ORAL | Status: DC
  Administered 2017-07-20 – 2017-07-22 (×2): 100 mg via ORAL
  Filled 2017-07-18 (×8): qty 1

## 2017-07-18 MED ORDER — METHOCARBAMOL 500 MG PO TABS
500.0000 mg | ORAL_TABLET | Freq: Four times a day (QID) | ORAL | Status: DC | PRN
  Administered 2017-07-18 – 2017-07-23 (×4): 500 mg via ORAL
  Filled 2017-07-18 (×5): qty 1

## 2017-07-18 MED ORDER — ACETAMINOPHEN 325 MG PO TABS: 650.0000 mg | ORAL_TABLET | Freq: Four times a day (QID) | ORAL | Status: DC | PRN

## 2017-07-18 MED ORDER — ACETAMINOPHEN 650 MG RE SUPP: 650.0000 mg | Freq: Four times a day (QID) | RECTAL | Status: DC | PRN

## 2017-07-18 MED ORDER — ONDANSETRON HCL 4 MG PO TABS
4.0000 mg | ORAL_TABLET | Freq: Four times a day (QID) | ORAL | Status: DC | PRN
  Filled 2017-07-18: qty 1

## 2017-07-18 MED ORDER — DEXTROSE 5 % IV SOLN
2.0000 g | INTRAVENOUS | Status: DC
  Administered 2017-07-18 – 2017-07-28 (×11): 2 g via INTRAVENOUS
  Filled 2017-07-18 (×11): qty 2

## 2017-07-18 MED ORDER — FENTANYL CITRATE (PF) 250 MCG/5ML IJ SOLN
INTRAMUSCULAR | Status: AC
  Filled 2017-07-18: qty 5

## 2017-07-18 MED ORDER — METHOCARBAMOL 1000 MG/10ML IJ SOLN
500.0000 mg | Freq: Four times a day (QID) | INTRAVENOUS | Status: DC | PRN
  Filled 2017-07-18: qty 5

## 2017-07-18 MED ORDER — ONDANSETRON HCL 4 MG/2ML IJ SOLN
4.0000 mg | Freq: Four times a day (QID) | INTRAMUSCULAR | Status: DC | PRN
  Administered 2017-07-18: 4 mg via INTRAVENOUS
  Filled 2017-07-18: qty 2

## 2017-07-18 MED ORDER — OXYCODONE HCL 5 MG PO TABS
5.0000 mg | ORAL_TABLET | ORAL | Status: DC | PRN
  Administered 2017-07-18 – 2017-07-27 (×25): 10 mg via ORAL
  Filled 2017-07-18 (×26): qty 2

## 2017-07-18 MED ORDER — LORATADINE 10 MG PO TABS
10.0000 mg | ORAL_TABLET | Freq: Every day | ORAL | Status: DC
  Filled 2017-07-18 (×2): qty 1

## 2017-07-18 MED ORDER — METOCLOPRAMIDE HCL 5 MG/ML IJ SOLN: 5.0000 mg | Freq: Three times a day (TID) | INTRAMUSCULAR | Status: DC | PRN

## 2017-07-18 MED ORDER — FERROUS SULFATE 325 (65 FE) MG PO TABS
325.0000 mg | ORAL_TABLET | Freq: Every day | ORAL | Status: DC
  Administered 2017-07-18 – 2017-07-25 (×8): 325 mg via ORAL
  Filled 2017-07-18 (×8): qty 1

## 2017-07-18 MED ORDER — VANCOMYCIN HCL 10 G IV SOLR
1250.0000 mg | INTRAVENOUS | Status: DC
  Administered 2017-07-18 – 2017-07-20 (×3): 1250 mg via INTRAVENOUS
  Filled 2017-07-18 (×4): qty 1250

## 2017-07-18 MED ORDER — SODIUM CHLORIDE 0.9 % IR SOLN
Status: DC | PRN
  Administered 2017-07-18: 1000 mL

## 2017-07-18 MED ORDER — PROPOFOL 10 MG/ML IV BOLUS
INTRAVENOUS | Status: AC
  Filled 2017-07-18: qty 20

## 2017-07-18 MED ORDER — MAGNESIUM OXIDE 400 (241.3 MG) MG PO TABS
200.0000 mg | ORAL_TABLET | Freq: Every day | ORAL | Status: DC
  Administered 2017-07-18 – 2017-07-29 (×12): 200 mg via ORAL
  Filled 2017-07-18 (×12): qty 1

## 2017-07-18 MED ORDER — HYDROMORPHONE HCL 1 MG/ML IJ SOLN
INTRAMUSCULAR | Status: AC
  Administered 2017-07-18: 0.5 mg via INTRAVENOUS
  Filled 2017-07-18: qty 1

## 2017-07-18 MED ORDER — SODIUM CHLORIDE 0.9 % IV SOLN
INTRAVENOUS | Status: DC
  Administered 2017-07-18 – 2017-07-19 (×2): via INTRAVENOUS

## 2017-07-18 MED ORDER — ACETAMINOPHEN 325 MG PO TABS
650.0000 mg | ORAL_TABLET | Freq: Four times a day (QID) | ORAL | Status: DC | PRN
  Administered 2017-07-20: 650 mg via ORAL
  Filled 2017-07-18: qty 2

## 2017-07-18 MED ORDER — ONDANSETRON HCL 4 MG PO TABS: 4.0000 mg | ORAL_TABLET | Freq: Four times a day (QID) | ORAL | Status: DC | PRN

## 2017-07-18 MED ORDER — ONDANSETRON HCL 4 MG/2ML IJ SOLN: 4.0000 mg | Freq: Four times a day (QID) | INTRAMUSCULAR | Status: DC | PRN

## 2017-07-18 MED ORDER — HYDROMORPHONE HCL 1 MG/ML IJ SOLN
1.0000 mg | INTRAMUSCULAR | Status: DC | PRN
  Administered 2017-07-19 – 2017-07-22 (×2): 1 mg via INTRAVENOUS
  Filled 2017-07-18 (×2): qty 1

## 2017-07-18 MED ORDER — HYDRALAZINE HCL 25 MG PO TABS: 25.0000 mg | ORAL_TABLET | Freq: Three times a day (TID) | ORAL | Status: DC | PRN

## 2017-07-18 MED ORDER — ASPIRIN EC 81 MG PO TBEC
81.0000 mg | DELAYED_RELEASE_TABLET | Freq: Every day | ORAL | Status: DC
  Administered 2017-07-18 – 2017-07-29 (×12): 81 mg via ORAL
  Filled 2017-07-18 (×12): qty 1

## 2017-07-18 MED ORDER — PROMETHAZINE HCL 25 MG/ML IJ SOLN: 6.2500 mg | INTRAMUSCULAR | Status: DC | PRN

## 2017-07-18 MED ORDER — MIDAZOLAM HCL 2 MG/2ML IJ SOLN
INTRAMUSCULAR | Status: AC
  Filled 2017-07-18: qty 2

## 2017-07-18 MED ORDER — LACTATED RINGERS IV SOLN
INTRAVENOUS | Status: DC
  Administered 2017-07-18: 10:00:00 via INTRAVENOUS

## 2017-07-18 MED ORDER — SODIUM CHLORIDE 0.9 % IV SOLN
INTRAVENOUS | Status: DC
  Administered 2017-07-18 – 2017-07-25 (×3): via INTRAVENOUS

## 2017-07-18 MED ORDER — METOCLOPRAMIDE HCL 5 MG PO TABS: 5.0000 mg | ORAL_TABLET | Freq: Three times a day (TID) | ORAL | Status: DC | PRN

## 2017-07-18 MED ORDER — MAGNESIUM CITRATE PO SOLN: 1.0000 | Freq: Once | ORAL | Status: DC | PRN

## 2017-07-18 MED ORDER — LEVOTHYROXINE SODIUM 75 MCG PO TABS
175.0000 ug | ORAL_TABLET | Freq: Every day | ORAL | Status: DC
  Administered 2017-07-18 – 2017-07-29 (×12): 175 ug via ORAL
  Filled 2017-07-18 (×12): qty 1

## 2017-07-18 MED ORDER — PROPOFOL 10 MG/ML IV BOLUS
INTRAVENOUS | Status: DC | PRN
  Administered 2017-07-18 (×2): 20 mg via INTRAVENOUS
  Administered 2017-07-18: 100 mg via INTRAVENOUS

## 2017-07-18 MED ORDER — DEXAMETHASONE SODIUM PHOSPHATE 10 MG/ML IJ SOLN
INTRAMUSCULAR | Status: DC | PRN
  Administered 2017-07-18: 5 mg via INTRAVENOUS

## 2017-07-18 MED ORDER — LIDOCAINE HCL (CARDIAC) 20 MG/ML IV SOLN
INTRAVENOUS | Status: DC | PRN
  Administered 2017-07-18: 60 mg via INTRAVENOUS

## 2017-07-18 MED ORDER — DILTIAZEM HCL ER COATED BEADS 120 MG PO CP24
120.0000 mg | ORAL_CAPSULE | Freq: Every day | ORAL | Status: DC
  Administered 2017-07-18 – 2017-07-29 (×12): 120 mg via ORAL
  Filled 2017-07-18 (×12): qty 1

## 2017-07-18 MED ORDER — 0.9 % SODIUM CHLORIDE (POUR BTL) OPTIME
TOPICAL | Status: DC | PRN
Start: 2017-07-18 — End: 2017-07-18
  Administered 2017-07-18: 2000 mL

## 2017-07-18 MED ORDER — BENAZEPRIL HCL 20 MG PO TABS: 40.0000 mg | ORAL_TABLET | Freq: Four times a day (QID) | ORAL | Status: DC | PRN

## 2017-07-18 MED ORDER — POLYETHYLENE GLYCOL 3350 17 G PO PACK: 17.0000 g | PACK | Freq: Every day | ORAL | Status: DC | PRN

## 2017-07-18 MED ORDER — FENTANYL CITRATE (PF) 100 MCG/2ML IJ SOLN
INTRAMUSCULAR | Status: DC | PRN
  Administered 2017-07-18 (×2): 25 ug via INTRAVENOUS
  Administered 2017-07-18 (×2): 50 ug via INTRAVENOUS

## 2017-07-18 MED ORDER — BENAZEPRIL HCL 20 MG PO TABS
40.0000 mg | ORAL_TABLET | Freq: Every day | ORAL | Status: DC
  Administered 2017-07-19: 40 mg via ORAL
  Filled 2017-07-18: qty 2

## 2017-07-18 MED ORDER — HYDROMORPHONE HCL 1 MG/ML IJ SOLN
0.2500 mg | INTRAMUSCULAR | Status: DC | PRN
  Administered 2017-07-18 (×4): 0.5 mg via INTRAVENOUS

## 2017-07-18 SURGICAL SUPPLY — 46 items
APL SKNCLS STERI-STRIP NONHPOA (GAUZE/BANDAGES/DRESSINGS) ×5
BENZOIN TINCTURE PRP APPL 2/3 (GAUZE/BANDAGES/DRESSINGS) ×10 IMPLANT
BLADE SURG 21 STRL SS (BLADE) ×3 IMPLANT
BNDG COHESIVE 6X5 TAN STRL LF (GAUZE/BANDAGES/DRESSINGS) ×4 IMPLANT
BNDG GAUZE ELAST 4 BULKY (GAUZE/BANDAGES/DRESSINGS) ×4 IMPLANT
CANISTER WOUND CARE 500ML ATS (WOUND CARE) ×2 IMPLANT
CASSETTE VERAFLO VERALINK (MISCELLANEOUS) ×2 IMPLANT
COVER SURGICAL LIGHT HANDLE (MISCELLANEOUS) ×4 IMPLANT
DRAPE INCISE IOBAN 66X45 STRL (DRAPES) ×2 IMPLANT
DRAPE U-SHAPE 47X51 STRL (DRAPES) ×3 IMPLANT
DRESSING HYDROCOLLOID 4X4 XTH (GAUZE/BANDAGES/DRESSINGS) ×4 IMPLANT
DRESSING VERAFLO CLEANSE CC (GAUZE/BANDAGES/DRESSINGS) IMPLANT
DRSG ADAPTIC 3X8 NADH LF (GAUZE/BANDAGES/DRESSINGS) ×1 IMPLANT
DRSG VERAFLO CLEANSE CC (GAUZE/BANDAGES/DRESSINGS) ×3
DURAPREP 26ML APPLICATOR (WOUND CARE) ×3 IMPLANT
ELECT REM PT RETURN 9FT ADLT (ELECTROSURGICAL) ×3
ELECTRODE REM PT RTRN 9FT ADLT (ELECTROSURGICAL) IMPLANT
GAUZE SPONGE 4X4 12PLY STRL (GAUZE/BANDAGES/DRESSINGS) ×1 IMPLANT
GLOVE BIOGEL PI IND STRL 6.5 (GLOVE) IMPLANT
GLOVE BIOGEL PI IND STRL 9 (GLOVE) ×1 IMPLANT
GLOVE BIOGEL PI INDICATOR 6.5 (GLOVE) ×4
GLOVE BIOGEL PI INDICATOR 9 (GLOVE) ×2
GLOVE SS PI 9.0 STRL (GLOVE) ×2 IMPLANT
GLOVE SURG ORTHO 9.0 STRL STRW (GLOVE) ×1 IMPLANT
GLOVE SURG SS PI 6.0 STRL IVOR (GLOVE) ×2 IMPLANT
GOWN STRL REUS W/ TWL LRG LVL3 (GOWN DISPOSABLE) IMPLANT
GOWN STRL REUS W/ TWL XL LVL3 (GOWN DISPOSABLE) ×2 IMPLANT
GOWN STRL REUS W/TWL LRG LVL3 (GOWN DISPOSABLE) ×6
GOWN STRL REUS W/TWL XL LVL3 (GOWN DISPOSABLE) ×3
HANDPIECE INTERPULSE COAX TIP (DISPOSABLE)
IMMOBILIZER KNEE 22 UNIV (SOFTGOODS) ×2 IMPLANT
KIT BASIN OR (CUSTOM PROCEDURE TRAY) ×3 IMPLANT
KIT ROOM TURNOVER OR (KITS) ×3 IMPLANT
MANIFOLD NEPTUNE II (INSTRUMENTS) ×3 IMPLANT
NS IRRIG 1000ML POUR BTL (IV SOLUTION) ×5 IMPLANT
PACK ORTHO EXTREMITY (CUSTOM PROCEDURE TRAY) ×3 IMPLANT
PAD ARMBOARD 7.5X6 YLW CONV (MISCELLANEOUS) ×4 IMPLANT
SET HNDPC FAN SPRY TIP SCT (DISPOSABLE) IMPLANT
SPONGE LAP 18X18 X RAY DECT (DISPOSABLE) ×4 IMPLANT
STOCKINETTE IMPERVIOUS 9X36 MD (GAUZE/BANDAGES/DRESSINGS) ×2 IMPLANT
SWAB COLLECTION DEVICE MRSA (MISCELLANEOUS) ×1 IMPLANT
SWAB CULTURE ESWAB REG 1ML (MISCELLANEOUS) IMPLANT
TOWEL OR 17X26 10 PK STRL BLUE (TOWEL DISPOSABLE) ×3 IMPLANT
TUBE CONNECTING 12'X1/4 (SUCTIONS) ×1
TUBE CONNECTING 12X1/4 (SUCTIONS) ×2 IMPLANT
YANKAUER SUCT BULB TIP NO VENT (SUCTIONS) ×3 IMPLANT

## 2017-07-18 NOTE — Consult Note (Signed)
ORTHOPAEDIC CONSULTATION  REQUESTING PHYSICIAN: Arrien, Jimmy Picket,*  Chief Complaint: gangrenous necrotic ulcer right knee  HPI: Shelby Reese is a 82 y.o. female who presents with large necrotic ulcer right knee. Patient states this was secondary to blunt trauma in July. Patient states that the ulcer has been necrotic for about 2 weeks.  Past Medical History:  Diagnosis Date  . Allergy   . Anemia   . CKD (chronic kidney disease), stage II   . Fibromyalgia   . GERD (gastroesophageal reflux disease)   . Hiatal hernia   . Hypertension   . Hypothyroid   . Lymphedema    venous insufficency  . Osteoarthritis   . Peripheral vascular disease ([place])   . Ulcer of knee ([place])    right  . Urinary incontinence   . Varicose veins   . Venous insufficiency    Past Surgical History:  Procedure Laterality Date  . CATARACT EXTRACTION W/ INTRAOCULAR LENS  IMPLANT, BILATERAL    . COLONOSCOPY    . KNEE ARTHROSCOPY Left    menisectomy  . MULTIPLE TOOTH EXTRACTIONS    . TONSILLECTOMY     Social History   Social History  . Marital status: Married    Spouse name: N/A  . Number of children: 0  . Years of education: N/A   Occupational History  .      retired Marine scientist.    Social History Main Topics  . Smoking status: Former Smoker    Packs/day: 0.50    Years: 10.00    Quit date: 11/19/1979  . Smokeless tobacco: Never Used  . Alcohol use 8.4 oz/week    14 Glasses of wine per week     Comment: 2 glasses of wine with dinner  . Drug use: No  . Sexual activity: Not Asked   Other Topics Concern  . None   Social History Narrative  . None   Family History  Problem Relation Age of Onset  . Stroke Mother   . Varicose Veins Mother   . Cancer Father        prostate  . Stroke Sister   . Heart disease Sister   . Varicose Veins Sister   . Stroke Maternal Grandmother   . Varicose Veins Sister    - negative except otherwise stated in the family history section Allergies    Allergen Reactions  . Bactrim [Sulfamethoxazole-Trimethoprim] Diarrhea and Nausea Only  . Ciprofloxacin Other (See Comments)    tremors  . Diovan [Valsartan] Other (See Comments)    Extreme vertigo  . Food     Orange Juice-upset stomach/diarrhea  . Latex Other (See Comments)    Rash/inflammation due to exposure  . Nitrofuran Derivatives Hives    "Full body rash"  . Penicillins Hives and Swelling    Has patient had a PCN reaction causing immediate rash, facial/tongue/throat swelling, SOB or lightheadedness with hypotension:No--severe irritation at the injection site Has patient had a PCN reaction causing severe rash involving mucus membranes or skin necrosis:Unknown Has patient had a PCN reaction that required hospitalization:No Has patient had a PCN reaction occurring within the last 10 years:Yes If all of the above answers are "NO", then may proceed with Cephalosporin use.   . Sulfa Antibiotics Diarrhea and Nausea Only  . Other Rash    Mycins   Prior to Admission medications   Medication Sig Start Date End Date Taking? Authorizing Provider  acetaminophen (TYLENOL) 500 MG tablet Take 1,000 mg by mouth daily.  Yes [provider]  aspirin EC 81 MG tablet Take 81 mg by mouth daily with breakfast.   Yes [provider]  benazepril (LOTENSIN) 40 MG tablet Take 40 mg by mouth daily with breakfast.  03/26/13  Yes [provider]  Calcium Carbonate-Vitamin D (CALCIUM 600+D) 600-400 MG-UNIT per tablet Take 1 tablet by mouth daily at 3 pm.    Yes [provider]  cetirizine (ZYRTEC) 10 MG tablet Take 10 mg by mouth at bedtime.    Yes [provider]  diltiazem (TIAZAC) 120 MG 24 hr capsule Take 120 mg by mouth daily with breakfast.  01/29/13  Yes [provider]  diphenhydrAMINE (BENADRYL) 25 mg capsule Take 50 mg by mouth every 6 (six) hours.    Yes [provider]  famotidine (PEPCID) 40 MG tablet Take 40 mg by mouth daily at 3  pm. 1600   Yes [provider]  ferrous sulfate 325 (65 FE) MG tablet Take 325 mg by mouth at bedtime.   Yes [provider]  ibuprofen (ADVIL,MOTRIN) 200 MG tablet Take 400 mg by mouth daily.   Yes [provider]  Magnesium Oxide (MAG-200 PO) Take 200 mg by mouth daily at 3 pm.   Yes [provider]  Multiple Vitamin (MULTIVITAMIN WITH MINERALS) TABS tablet Take 1 tablet by mouth daily.   Yes [provider]  NATURAL PSYLLIUM FIBER PO Take 1 capsule by mouth daily.   Yes [provider]  pseudoephedrine (SUDAFED) 30 MG tablet Take 30 mg by mouth every 6 (six) hours.    Yes [provider]  silver sulfADIAZINE (SILVADENE) 1 % cream Apply 1 application topically daily. 07/14/17  Yes Suzan Slick, NP  SYNTHROID 175 MCG tablet Take 175 mcg by mouth daily before breakfast.  09/20/15  Yes [provider]  vitamin C (ASCORBIC ACID) 500 MG tablet Take 500 mg by mouth daily.   Yes [provider]  Gauze Pads & Dressings (KERLIX GAUZE ROLL LARGE) MISC 1 Units by Does not apply route 4 (four) times daily. 07/14/17   Suzan Slick, NP  Gauze Pads & Dressings 4"X4-1/2" PADS 2 Units by Does not apply route 4 (four) times daily. 07/14/17   Suzan Slick, NP   No results found. - pertinent xrays, CT, MRI studies were reviewed and independently interpreted  Positive ROS: All other systems have been reviewed and were otherwise negative with the exception of those mentioned in the HPI and as above.  Physical Exam: General: Alert, no acute distress Psychiatric: Patient is competent for consent with normal mood and affect Lymphatic: No axillary or cervical lymphadenopathy Cardiovascular: No pedal edema Respiratory: No cyanosis, no use of accessory musculature GI: No organomegaly, abdomen is soft and non-tender  Skin: large necrotic ulcer right knee with redness around the skin edges. No ascending cellulitis.   Neurologic:  Patient does not have protective sensation bilateral lower extremities.   MUSCULOSKELETAL:  Patient's right lower extremity is neurovascularly intact she has a large necrotic wound over the patella tendon.  Assessment: Assessment: Necrotic ulcer right knee.  Plan: Plan: We will continue IV antibiotics. Plan for excision of the necrotic tissue placement of an instillation wound VAC return to the operating room next Wednesday. Discussed risks and benefits of surgery including potential risk of limb loss. Patient states she understands wish to proceed at this time.  Thank you for the consult and the opportunity to see Ms. Merry Proud, MD [facility]  Orthopedics [phone] 6:49 AM

## 2017-07-18 NOTE — Care Management Note (Signed)
Case Management Note  Patient Details  Name: Shelby Reese MRN: [id number] Date of Birth: 1934-12-21  Subjective/Objective:                 Patient admitted from home w husband. Will have I&D w Dr Sharol Given today, may return from sx with VAC.    Action/Plan:  CM will continue to follow.  Expected Discharge Date:  07/20/17               Expected Discharge Plan:  [place]  In-House Referral:     Discharge planning Services  CM Consult  Post Acute Care Choice:    Choice offered to:     DME Arranged:    DME Agency:     HH Arranged:    HH Agency:     Status of Service:  In process, will continue to follow  If discussed at Long Length of Stay Meetings, dates discussed:    Additional Comments:  Carles Collet, RN 07/18/2017, 10:12 AM

## 2017-07-18 NOTE — Op Note (Signed)
07/17/2017 - 07/18/2017  11:44 AM  PATIENT:  Shelby Reese    PRE-OPERATIVE DIAGNOSIS:  NECROTIC RIGHT KNEE Wound  POST-OPERATIVE DIAGNOSIS:  Same  PROCEDURE:  IRRIGATION AND DEBRIDEMENT RIGHT KNEE, excision of skin soft tissue muscle and fat,  APPLICATION OF WOUND VAC instillation  SURGEON:  [name], MD  PHYSICIAN ASSISTANT:None ANESTHESIA:   General  PREOPERATIVE INDICATIONS:  Shelby Reese is a  82 y.o. female with a diagnosis of NECROTIC RIGHT KNEE who failed conservative measures and elected for surgical management.    The risks benefits and alternatives were discussed with the patient preoperatively including but not limited to the risks of infection, bleeding, nerve injury, cardiopulmonary complications, the need for revision surgery, among others, and the patient was willing to proceed.  OPERATIVE IMPLANTS: Reticulated wound VAC instillation sponge  OPERATIVE FINDINGS: Necrotic fat layer 3 cm thick, wound 15 x 20 cm  OPERATIVE PROCEDURE: Patient was brought to the operating room and underwent a general anesthetic. After adequate levels anesthesia were obtained patient's right lower extremity was first scrubbed and cleansed with Hibiclens and a scrub brush and sponge dried and then sterilely prepped using DuraPrep draped into a sterile field a timeout was called. The edges of the necrotic wound were cut back with a 21 blade knife this left a healthy wound edge that was 20 x 15 cm. There is approximately 3 cm thickness of necrotic fat soft tissue and muscle layers. Using sharp debridement with a 21 blade knife a [place] of this necrotic layer was incised. This did not communicate with the knee joint. This was irrigated normal saline electrocautery was used for hemostasis. The wound edges were prepped using benzoin the wound edges were prepped out with Sharol Given to protect the wound edges the instillation wound VAC sponge 2 layer was then applied deep within the wound there was  significant amount of undermining in the undermined area was filled with the wound VAC sponge. Undermining was approximately 10 cm. A wound VAC dressing was applied this had a good suction fit the fluid was set for 22 mL. Patient was extubated taken to the PACU in stable condition. Plan for return to the operating room on Wednesday for repeat irrigation debridement and application of the second instillation sponge and then return on Friday for possible skin graft.

## 2017-07-18 NOTE — Anesthesia Preprocedure Evaluation (Addendum)
Anesthesia Evaluation  Patient identified by MRN, date of birth, ID band Patient awake    Reviewed: Allergy & Precautions, NPO status , Patient's Chart, lab work & pertinent test results  Airway Mallampati: II  TM Distance: >3 FB Neck ROM: Full    Dental no notable dental hx.    Pulmonary former smoker,    breath sounds clear to auscultation       Cardiovascular hypertension, + Peripheral Vascular Disease   Rhythm:Regular Rate:Normal     Neuro/Psych  Neuromuscular disease    GI/Hepatic hiatal hernia, GERD  ,  Endo/Other    Renal/GU Renal disease     Musculoskeletal  (+) Arthritis , Fibromyalgia -  Abdominal (+) + obese,   Peds  Hematology  (+) anemia ,   Anesthesia Other Findings   Reproductive/Obstetrics                            Anesthesia Physical Anesthesia Plan  ASA: III  Anesthesia Plan: General   Post-op Pain Management:    Induction: Intravenous  PONV Risk Score and Plan: 3 and Ondansetron, Dexamethasone, Midazolam and Propofol infusion  Airway Management Planned: LMA  Additional Equipment:   Intra-op Plan:   Post-operative Plan: Extubation in OR  Informed Consent: I have reviewed the patients History and Physical, chart, labs and discussed the procedure including the risks, benefits and alternatives for the proposed anesthesia with the patient or authorized representative who has indicated his/her understanding and acceptance.   Dental advisory given  Plan Discussed with:   Anesthesia Plan Comments:         Anesthesia Quick Evaluation

## 2017-07-18 NOTE — Anesthesia Procedure Notes (Signed)
Procedure Name: LMA Insertion Date/Time: 07/18/2017 11:00 AM Performed by: Lavell Luster Pre-anesthesia Checklist: Patient identified, Emergency Drugs available, Suction available, Patient being monitored and Timeout performed Patient Re-evaluated:Patient Re-evaluated prior to induction Oxygen Delivery Method: Circle system utilized Preoxygenation: Pre-oxygenation with 100% oxygen Induction Type: IV induction Ventilation: Mask ventilation without difficulty LMA: LMA flexible inserted LMA Size: 4.0 Number of attempts: 1 Placement Confirmation: breath sounds checked- equal and bilateral and positive ETCO2 Tube secured with: Tape Dental Injury: Teeth and Oropharynx as per pre-operative assessment

## 2017-07-18 NOTE — Anesthesia Postprocedure Evaluation (Signed)
Anesthesia Post Note  Patient: Shelby Reese  Procedure(s) Performed: Procedure(s) (LRB): IRRIGATION AND DEBRIDEMENT RIGHT KNEE (Right) APPLICATION OF WOUND VAC (Right)     Patient location during evaluation: PACU Anesthesia Type: General Level of consciousness: awake and alert Pain management: pain level controlled Vital Signs Assessment: post-procedure vital signs reviewed and stable Respiratory status: spontaneous breathing, nonlabored ventilation, respiratory function stable and patient connected to nasal cannula oxygen Cardiovascular status: blood pressure returned to baseline and stable Postop Assessment: no signs of nausea or vomiting Anesthetic complications: no    Last Vitals:  Vitals:   07/18/17 1230 07/18/17 1240  BP:  (!) 128/59  Pulse:  80  Resp:  12  Temp:    SpO2: 97% 94%    Last Pain:  Vitals:   07/18/17 1240  TempSrc:   PainSc: 4                  Timothee Gali,JAMES TERRILL

## 2017-07-18 NOTE — Progress Notes (Signed)
PROGRESS NOTE    Shelby Reese  EVO:[id number] DOB: [birth year] DOA: 07/17/2017 PCP: [name], MD    Brief Narrative:  82 year old female who presents to the leg pain. Patient is known to have hypertension, hypothyroidism and chronic kidney disease. About 2 months prior to hospitalization she developed a right knee traumatic hematoma, spontaneously drain about 3 weeks ago and recently complicated with erythema, pain and worsening edema. She was seen by orthopedics as an outpatient, she was started on antibiotics, with recommendations for surgical debridement. On the initial physical examination blood pressure 165/58, heart rate 97, temperature 98.5,respiratory rate 16, oxygen saturation 98%. Moist mucous membranes, lungs clear to auscultation, heart S1-S2 present and rhythmic, trace lower extremity edema. Right knee with a large ulcerated wound, throughout the right knee, purulent drainage, unstageable. Sodium 134, potassium 4.1, chloride 102, bicarbonate 21, glucose 139, BUN 34, creatinine 1.49, white count 11.4, hemoglobin 9.5, hematocrit 30.9, platelets 373. Urinalysis negative for infection.  Patient admitted to the hospital working diagnosis of large right knee infected ulcerated wound.    Assessment & Plan:   Principal Problem:   Cellulitis Active Problems:   Hypothyroidism, acquired   Anemia, iron deficiency   Essential hypertension   Wound, open, knee, lower leg, or ankle with complication, right, initial encounter   1. Large right knee infected ulcerated wound. Patient sp post irrigation and debridement. Will continue antibiotic therapy with IV ceftriaxone,  and vancomycin, will follow on cultures, cell count and temperature curve. WBC at 10.0 and has remain afebrile.   2. Hypertension. Systolic blood pressure 299 to 160, will continue NS at 100 cc per hour, benazepril, hydralazine, diltiazem.    3. Chronic anemia. Hb and hct stable at 8,8 and 29.5, will continue to follow on  cell count, no indication for blood transfusion at this point. Continue ferrous sulfate.   4. Hypothyroidism. Continue with levothyroxine.    DVT prophylaxis:  Code Status:  Family Communication:  Disposition Plan:    Consultants:   Orthopedics  Procedures:   I&D right knee 08/30  Antimicrobials:   Ceftriaxone  Vancomycin   Subjective: Patient feels confused, but able to respond to all questions, no nausea or vomiting and pain is well controlled, no dyspnea or chest pain.   Objective: Vitals:   07/18/17 0301 07/18/17 0350 07/18/17 1016 07/18/17 1140  BP: (!) 165/58 (!) 159/74  (!) 174/67  Pulse: 97 98  93  Resp: 16   14  Temp:  99.2 F (37.3 C)  98.1 F (36.7 C)  TempSrc:  Oral    SpO2: 98% 98%  95%  Weight:   106.6 kg (235 lb)   Height:   5\' 5"  (1.651 m)     Intake/Output Summary (Last 24 hours) at 07/18/17 1155 Last data filed at 07/18/17 1141  Gross per 24 hour  Intake              810 ml  Output              100 ml  Net              710 ml   Filed Weights   07/17/17 2046 07/18/17 1016  Weight: 106.6 kg (235 lb) 106.6 kg (235 lb)    Examination:  General: deconditioned Neurology: Awake and alert, non focal  E ENT: no pallor, no icterus, oral mucosa moist Cardiovascular: S1-S2 present, rhythmic, no gallops, rubs, or murmurs. No jugular venous distention, no lower extremity edema. Pulmonary: vesicular breath sounds  bilaterally, adequate air movement, no wheezing, rhonchi or rales. Gastrointestinal. Abdomen flat, no organomegaly, non tender, no rebound or guarding Skin. No rashes Musculoskeletal: no joint deformities. Right leg with boot and dressing in place. Trace distal edema.      Data Reviewed: I have personally reviewed following labs and imaging studies  CBC:  Recent Labs Lab 07/17/17 1827 07/18/17 0403  WBC 11.4* 10.0  NEUTROABS 8.6*  --   HGB 9.5* 8.8*  HCT 30.9* 29.5*  MCV 87.5 88.1  PLT 373 782   Basic Metabolic  Panel:  Recent Labs Lab 07/17/17 1827 07/18/17 0403  NA 134* 137  K 4.1 4.3  CL 102 107  CO2 21* 20*  GLUCOSE 139* 107*  BUN 34* 26*  CREATININE 1.49* 1.20*  CALCIUM 9.6 9.6   GFR: Estimated Creatinine Clearance: 46.1 mL/min (A) (by C-G formula based on SCr of 1.2 mg/dL (H)). Liver Function Tests:  Recent Labs Lab 07/17/17 1827  AST 19  ALT 14  ALKPHOS 56  BILITOT 0.4  PROT 7.7  ALBUMIN 3.7   No results for input(s): LIPASE, AMYLASE in the last 168 hours. No results for input(s): AMMONIA in the last 168 hours. Coagulation Profile: No results for input(s): INR, PROTIME in the last 168 hours. Cardiac Enzymes: No results for input(s): CKTOTAL, CKMB, CKMBINDEX, TROPONINI in the last 168 hours. BNP (last 3 results) No results for input(s): PROBNP in the last 8760 hours. HbA1C: No results for input(s): HGBA1C in the last 72 hours. CBG: No results for input(s): GLUCAP in the last 168 hours. Lipid Profile: No results for input(s): CHOL, HDL, LDLCALC, TRIG, CHOLHDL, LDLDIRECT in the last 72 hours. Thyroid Function Tests: No results for input(s): TSH, T4TOTAL, FREET4, T3FREE, THYROIDAB in the last 72 hours. Anemia Panel: No results for input(s): VITAMINB12, FOLATE, FERRITIN, TIBC, IRON, RETICCTPCT in the last 72 hours.    Radiology Studies: I have reviewed all of the imaging during this hospital visit personally     Scheduled Meds: . [MAR Hold] acetaminophen  1,000 mg Oral Daily  . [MAR Hold] aspirin EC  81 mg Oral Q breakfast  . [MAR Hold] benazepril  40 mg Oral Q breakfast  . [MAR Hold] diltiazem  120 mg Oral Q breakfast  . [MAR Hold] enoxaparin (LOVENOX) injection  40 mg Subcutaneous Q24H  . [MAR Hold] famotidine  40 mg Oral Q1500  . [MAR Hold] ferrous sulfate  325 mg Oral QHS  . [MAR Hold] levothyroxine  175 mcg Oral QAC breakfast  . [MAR Hold] loratadine  10 mg Oral Daily  . [MAR Hold] magnesium oxide  200 mg Oral Q1500   Continuous Infusions: . sodium  chloride 100 mL/hr at 07/18/17 0354  . [MAR Hold] cefTRIAXone (ROCEPHIN)  IV Stopped (07/18/17 0612)  . lactated ringers Stopped (07/18/17 1141)  . [MAR Hold] vancomycin       LOS: 1 day      Tawni Millers, MD [facility] Pager [phone]

## 2017-07-18 NOTE — ED Notes (Signed)
Pt transferred to [place] via [facility] 

## 2017-07-18 NOTE — Transfer of Care (Signed)
Immediate Anesthesia Transfer of Care Note  Patient: Shelby Reese  Procedure(s) Performed: Procedure(s): IRRIGATION AND DEBRIDEMENT RIGHT KNEE (Right) APPLICATION OF WOUND VAC (Right)  Patient Location: PACU  Anesthesia Type:General  Level of Consciousness: awake, alert  and oriented  Airway & Oxygen Therapy: Patient Spontanous Breathing  Post-op Assessment: Post -op Vital signs reviewed and stable  Post vital signs: stable  Last Vitals:  Vitals:   07/18/17 0301 07/18/17 0350  BP: (!) 165/58 (!) 159/74  Pulse: 97 98  Resp: 16   Temp:  37.3 C  SpO2: 98% 98%    Last Pain:  Vitals:   07/18/17 0849  TempSrc:   PainSc: 5          Complications: No apparent anesthesia complications

## 2017-07-18 NOTE — H&P (Signed)
History and Physical  Patient Name: Shelby Reese     DJM:[id number]    DOB: November 14, 1935    DOA: 07/17/2017 PCP: [name], MD  Patient coming from: Home  Chief Complaint: Leg pain      HPI: Shelby Reese is a 82 y.o. female with a past medical history significant for HTN, hypothyroidism and CKD who presents with leg infection.  The patient was in her normal state of health until 2 months ago when she fell, developed a hematoma or fluid collection of some sort on her right knee.  This fluid collection persisted for 2 months, until about 2-3 weeks ago it darkened up, opened up and started draining serosanguinous fluid.  Since then, it has continued to drain, it has had redness, swelling and pain, throughout the right leg.  She was seen by Orthopedics, who recommended debridement in the OR this week.  Someone started oral antibiotics, although she redirects when I ask whether she took them.  She has had no fever, chills, malaise, nausea, vomiting or weakness.  Tonight, she was fed up with waiting, so she came to the ER.  ED course: -Afebrile, heart rate 94, respirations and pulse ox normal, BP 171/62 -Na 134, K 4.1, Cr 1.5 (baseline 1.2-1.4), WBC 11.4K, Hgb 9.5 (baseline 10, normocytic) -UA unremarkable -Lactic acid 0.8 -The case was discussed with Dr. Sharol Given who recommended IV vancomycin, coverage for GNRs, and operative debridement tomorrow     ROS: Review of Systems  Constitutional: Negative for fever and malaise/fatigue.  Respiratory: Negative for cough, shortness of breath and wheezing.   Cardiovascular: Negative for chest pain, palpitations, leg swelling and PND.  All other systems reviewed and are negative.         Past Medical History:  Diagnosis Date  . Allergy   . Anemia   . CKD (chronic kidney disease), stage II   . Fibromyalgia   . GERD (gastroesophageal reflux disease)   . Hiatal hernia   . Hypertension   . Hypothyroid   . Lymphedema    venous insufficency    . Osteoarthritis   . Peripheral vascular disease ([place])   . Ulcer of knee ([place])    right  . Urinary incontinence   . Varicose veins   . Venous insufficiency     Past Surgical History:  Procedure Laterality Date  . CATARACT EXTRACTION W/ INTRAOCULAR LENS  IMPLANT, BILATERAL    . COLONOSCOPY    . KNEE ARTHROSCOPY Left    menisectomy  . MULTIPLE TOOTH EXTRACTIONS    . TONSILLECTOMY      Social History: Patient lives with her husband.  The patient walks with a walker.  Remote former smoker.  From near [place].  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  Allergies  Allergen Reactions  . Bactrim [Sulfamethoxazole-Trimethoprim] Diarrhea and Nausea Only  . Ciprofloxacin Other (See Comments)    tremors  . Diovan [Valsartan] Other (See Comments)    Extreme vertigo  . Food     Orange Juice-upset stomach/diarrhea  . Latex Other (See Comments)    Rash/inflammation due to exposure  . Nitrofuran Derivatives Hives    "Full body rash"  . Penicillins Hives and Swelling    Has patient had a PCN reaction causing immediate rash, facial/tongue/throat swelling, SOB or lightheadedness with hypotension:No--severe irritation at the injection site Has patient had a PCN reaction causing severe rash involving mucus membranes or skin necrosis:Unknown Has patient had a PCN reaction that required hospitalization:No Has patient  had a PCN reaction occurring within the last 10 years:Yes If all of the above answers are "NO", then may proceed with Cephalosporin use.   . Sulfa Antibiotics Diarrhea and Nausea Only  . Other Rash    Mycins    Family history: family history includes Cancer in her father; Heart disease in her sister; Stroke in her maternal grandmother, mother, and sister; Varicose Veins in her mother, sister, and sister.  Prior to Admission medications   Medication Sig Start Date End Date Taking? Authorizing Provider  acetaminophen (TYLENOL) 500 MG tablet Take 1,000 mg by mouth daily.   Yes  [provider]  aspirin EC 81 MG tablet Take 81 mg by mouth daily with breakfast.   Yes [provider]  benazepril (LOTENSIN) 40 MG tablet Take 40 mg by mouth daily with breakfast.  03/26/13  Yes [provider]  Calcium Carbonate-Vitamin D (CALCIUM 600+D) 600-400 MG-UNIT per tablet Take 1 tablet by mouth daily at 3 pm.    Yes [provider]  cetirizine (ZYRTEC) 10 MG tablet Take 10 mg by mouth at bedtime.    Yes [provider]  diltiazem (TIAZAC) 120 MG 24 hr capsule Take 120 mg by mouth daily with breakfast.  01/29/13  Yes [provider]  diphenhydrAMINE (BENADRYL) 25 mg capsule Take 50 mg by mouth every 6 (six) hours.    Yes [provider]  famotidine (PEPCID) 40 MG tablet Take 40 mg by mouth daily at 3 pm. 1600   Yes [provider]  ferrous sulfate 325 (65 FE) MG tablet Take 325 mg by mouth at bedtime.   Yes [provider]  ibuprofen (ADVIL,MOTRIN) 200 MG tablet Take 400 mg by mouth daily.   Yes [provider]  Magnesium Oxide (MAG-200 PO) Take 200 mg by mouth daily at 3 pm.   Yes [provider]  Multiple Vitamin (MULTIVITAMIN WITH MINERALS) TABS tablet Take 1 tablet by mouth daily.   Yes [provider]  NATURAL PSYLLIUM FIBER PO Take 1 capsule by mouth daily.   Yes [provider]  pseudoephedrine (SUDAFED) 30 MG tablet Take 30 mg by mouth every 6 (six) hours.    Yes [provider]  silver sulfADIAZINE (SILVADENE) 1 % cream Apply 1 application topically daily. 07/14/17  Yes Suzan Slick, NP  SYNTHROID 175 MCG tablet Take 175 mcg by mouth daily before breakfast.  09/20/15  Yes [provider]  vitamin C (ASCORBIC ACID) 500 MG tablet Take 500 mg by mouth daily.   Yes [provider]  Gauze Pads & Dressings (KERLIX GAUZE ROLL LARGE) MISC 1 Units by Does not apply route 4 (four) times daily. 07/14/17   Suzan Slick, NP  Gauze Pads & Dressings  4"X4-1/2" PADS 2 Units by Does not apply route 4 (four) times daily. 07/14/17   Suzan Slick, NP       Physical Exam: BP (!) 165/58 (BP Location: Left Arm)   Pulse 97   Temp 98.5 F (36.9 C) (Oral)   Resp 16   Ht 5\' 5"  (1.651 m)   Wt 106.6 kg (235 lb)   SpO2 98%   BMI 39.11 kg/m  General appearance: Well-developed, obese adult female, alert and in no acute distress.   Eyes: Anicteric, conjunctiva pink, lids and lashes normal. PERRL.    ENT: No nasal deformity, discharge, epistaxis.  Hearing normal. OP moist without lesions.   Neck: No neck masses.  Trachea midline.  No thyromegaly/tenderness.  Lymph: No cervical or supraclavicular lymphadenopathy. Skin: Warm and dry.  No jaundice.  No suspicious rashes or lesions other than this on right knee:  Cardiac: RRR, nl S1-S2, no murmurs appreciated.  Capillary refill is brisk.  JVP not visible.  Mild bilateral LE edema.  Radial pulses 2+ and symmetric. Respiratory: Normal respiratory rate and rhythm.  CTAB without rales or wheezes. Abdomen: Abdomen soft.  No TTP. No ascites, distension, hepatosplenomegaly.   MSK: No deformities or effusions.  No cyanosis or clubbing. Neuro: Cranial nerves normal.  Sensation intact to light touch. Speech is fluent.  Muscle strength normal.    Psych: Sensorium intact and responding to questions, attention normal.  Behavior appropriate.  Affect normal.  Judgment and insight appear normal.     Labs on Admission:  I have personally reviewed following labs and imaging studies: CBC:  Recent Labs Lab 07/17/17 1827  WBC 11.4*  NEUTROABS 8.6*  HGB 9.5*  HCT 30.9*  MCV 87.5  PLT 621   Basic Metabolic Panel:  Recent Labs Lab 07/17/17 1827  NA 134*  K 4.1  CL 102  CO2 21*  GLUCOSE 139*  BUN 34*  CREATININE 1.49*  CALCIUM 9.6   GFR: Estimated Creatinine Clearance: 37.1 mL/min (A) (by C-G formula based on SCr of 1.49 mg/dL (H)).  Liver Function Tests:  Recent Labs Lab 07/17/17 1827  AST  19  ALT 14  ALKPHOS 56  BILITOT 0.4  PROT 7.7  ALBUMIN 3.7   No results for input(s): LIPASE, AMYLASE in the last 168 hours. No results for input(s): AMMONIA in the last 168 hours. Coagulation Profile: No results for input(s): INR, PROTIME in the last 168 hours. Cardiac Enzymes: No results for input(s): CKTOTAL, CKMB, CKMBINDEX, TROPONINI in the last 168 hours. BNP (last 3 results) No results for input(s): PROBNP in the last 8760 hours. HbA1C: No results for input(s): HGBA1C in the last 72 hours. CBG: No results for input(s): GLUCAP in the last 168 hours. Lipid Profile: No results for input(s): CHOL, HDL, LDLCALC, TRIG, CHOLHDL, LDLDIRECT in the last 72 hours. Thyroid Function Tests: No results for input(s): TSH, T4TOTAL, FREET4, T3FREE, THYROIDAB in the last 72 hours. Anemia Panel: No results for input(s): VITAMINB12, FOLATE, FERRITIN, TIBC, IRON, RETICCTPCT in the last 72 hours. Sepsis Labs: Lactic acid normal Invalid input(s): PROCALCITONIN, LACTICIDVEN No results found for this or any previous visit (from the past 240 hour(s)).          Assessment/Plan  1. Skin breakdown:  There is some redness and swelling around the necrotic ulcer on her leg.  This warrants IV antibiotics perioperatively.  From the perioperative standpoint, she is ASA 2, Lyndel Safe risk 0.72% for CV risk.  She is not able to reach 4 METs, but has no previous CV disease, no active cardiac symptoms, and no new murmurs, so no further pre-op cardiovascular testing is warranted at this time. -IV ceftriaxone and vancomycin -Debridement per Orthopedics   2. Hypertension:  -Hold ACEi pre-operatively, restart day after -Continue diltiazem -Hydralazine PRN for SBP > 160  3. Anemia:  Stable at baseline.  Patient states her baseline Hgb is "3.5", and that she is close to needing a transfusion. -Transfusion threshold 7 g/dL  4. Hypothyroidism:  -Continue levothyroxine  5. Other medications:  -May have  Pepcid, allergy medication     DVT prophylaxis: Lovenox, post-op  Code Status: FULL  Family Communication: None present  Disposition Plan: Anticipate IV antbiotics and debridement in the OR by Dr. Sharol Given tomorrow  Consults called: Orthopedics Admission status: INPATIENT    Medical decision making: Patient seen at 10:20 PM on 07/17/2017.  The patient was discussed with Dr. Sharol Given.  What exists of the patient's chart was reviewed in depth sand summarized above.  Clinical condition: stable.        Edwin Dada [facility] Pager [phone]

## 2017-07-18 NOTE — ED Notes (Signed)
Pt ambulated to restroom with walker

## 2017-07-18 NOTE — Progress Notes (Signed)
Pharmacy Antibiotic Note  Shelby Reese is a 82 y.o. female admitted on 07/17/2017 with cellulitis.  Pharmacy has been consulted for Vancomycin, ceftriaxone dosing.  Plan: Vancomycin 1250mg  IV every 24 hours.  Goal trough 15-20 mcg/mL. Ceftriaxone 2gm iv q24hr  Height: 5\' 5"  (165.1 cm) Weight: 235 lb (106.6 kg) IBW/kg (Calculated) : 57  Temp (24hrs), Avg:98.3 F (36.8 C), Min:98.3 F (36.8 C), Max:98.3 F (36.8 C)   Recent Labs Lab 07/17/17 1827 07/17/17 1839  WBC 11.4*  --   CREATININE 1.49*  --   LATICACIDVEN  --  0.83    Estimated Creatinine Clearance: 37.1 mL/min (A) (by C-G formula based on SCr of 1.49 mg/dL (H)).    Allergies  Allergen Reactions  . Bactrim [Sulfamethoxazole-Trimethoprim] Diarrhea and Nausea Only  . Ciprofloxacin Other (See Comments)    tremors  . Diovan [Valsartan] Other (See Comments)    Extreme vertigo  . Food     Orange Juice-upset stomach/diarrhea  . Latex Other (See Comments)    Rash/inflammation due to exposure  . Nitrofuran Derivatives Hives    "Full body rash"  . Penicillins Hives and Swelling    Has patient had a PCN reaction causing immediate rash, facial/tongue/throat swelling, SOB or lightheadedness with hypotension:No--severe irritation at the injection site Has patient had a PCN reaction causing severe rash involving mucus membranes or skin necrosis:Unknown Has patient had a PCN reaction that required hospitalization:No Has patient had a PCN reaction occurring within the last 10 years:Yes If all of the above answers are "NO", then may proceed with Cephalosporin use.   . Sulfa Antibiotics Diarrhea and Nausea Only  . Other Rash    Mycins    Antimicrobials this admission: 8/30 vanc>> 8/30 cefepime>> x1 8/30 flagyl>> x1 8/31 Ceftriaxone >>  Dose adjustments this admission: -  Microbiology results: pending  Thank you for allowing pharmacy to be a part of this patient's care.  Nani Skillern Crowford 07/18/2017 1:23  AM

## 2017-07-18 NOTE — Progress Notes (Signed)
Orthopedic Tech Progress Note Patient Details:  Shelby Reese Mar 24, 1935 [id number]  Ortho Devices Type of Ortho Device: Postop shoe/boot Ortho Device/Splint Interventions: Application   [place] Pink 07/18/2017, 1:45 PM

## 2017-07-18 NOTE — Evaluation (Addendum)
Physical Therapy Evaluation Patient Details Name: Shelby Reese MRN: [id number] DOB: 03/28/1935 Today's Date: 07/18/2017   History of Present Illness  Pt is a 82 y/o female admitted secondary to R knee necrotic wound and is s/p I and D with wound vac placement. PMH includes CKD, HTN, fibromyalgia, PVD, and L menisectomy.   Clinical Impression  Pt s/p surgery above with deficits below. PTA, pt was independent with mobility using standard walker. Upon eval, pt very limited by pain and gait distance limited to chair. Pt requiring min to mod A for mobility this session. Pt reports plan is to go home, however, is a little bit concerned about the assist her husband will provide for her. Anticipate pt will progress well once pain controlled and will need HHPT. However, if pt unable to progress with mobility, may need to consider SNF to increase independence and safety with functional mobility. Will continue to follow acutely and update recommendations as pt progresses.     Follow Up Recommendations DC plan and follow up therapy as arranged by surgeon;Supervision/Assistance - 24 hour    Equipment Recommendations  3in1 (PT)    Recommendations for Other Services OT consult     Precautions / Restrictions Precautions Precautions: Fall Required Braces or Orthoses: Knee Immobilizer - Right Knee Immobilizer - Right: On at all times Restrictions Weight Bearing Restrictions: Yes RLE Weight Bearing: Weight bearing as tolerated      Mobility  Bed Mobility Overal bed mobility: Needs Assistance Bed Mobility: Supine to Sit     Supine to sit: Mod assist     General bed mobility comments: Mod A using bed pad to scoot hips to EOB   Transfers Overall transfer level: Needs assistance Equipment used: Rolling walker (2 wheeled) Transfers: Sit to/from Stand Sit to Stand: Min assist         General transfer comment: Min A for lift assist and steadying once standing. Demonstrated safe hand  placement.   Ambulation/Gait Ambulation/Gait assistance: Min guard Ambulation Distance (Feet): 5 Feet Assistive device: Rolling walker (2 wheeled) Gait Pattern/deviations: Step-to pattern;Decreased step length - right;Decreased step length - left;Decreased weight shift to right;Antalgic Gait velocity: Decreased Gait velocity interpretation: Below normal speed for age/gender General Gait Details: Slow, antalgic gait. Verbal cues for sequencing using RW. Distance limited secondary to pain. Min guard for steadying assist. Verbal cues for upright posture   Stairs            Wheelchair Mobility    Modified Rankin (Stroke Patients Only)       Balance Overall balance assessment: Needs assistance Sitting-balance support: No upper extremity supported;Feet supported Sitting balance-Leahy Scale: Fair     Standing balance support: Bilateral upper extremity supported;During functional activity Standing balance-Leahy Scale: Poor Standing balance comment: Reliant on RW for balance                              Pertinent Vitals/Pain Pain Assessment: Faces Faces Pain Scale: Hurts even more Pain Location: R knee  Pain Descriptors / Indicators: Aching;Operative site guarding Pain Intervention(s): Limited activity within patient's tolerance;Monitored during session;Repositioned;Patient requesting pain meds-RN notified    Home Living Family/patient expects to be discharged to:: Private residence Living Arrangements: Spouse/significant other Available Help at Discharge: Family;Available PRN/intermittently Type of Home: House Home Access: Stairs to enter Entrance Stairs-Rails: None (reports she uses door frame ) Entrance Stairs-Number of Steps: 2 Home Layout: Two level;Bed/bath upstairs (Has chair lift ) Home Equipment:  Walker - standard;Shower seat;Other (comment) (chair lift ) Additional Comments: Pt reports husband is not of any help at home and complains when she needs  him to help her. Reports he takes naps most of the day so may not be able to assist.     Prior Function Level of Independence: Independent with assistive device(s)         Comments: Used Walker at baseline      Hand Dominance        Extremity/Trunk Assessment   Upper Extremity Assessment Upper Extremity Assessment: Defer to OT evaluation    Lower Extremity Assessment Lower Extremity Assessment: RLE deficits/detail;LLE deficits/detail;Generalized weakness RLE Deficits / Details: Numbness from knee down at baseline. Deficits consistent with post op pain and weakness.  LLE Deficits / Details: Numbness from knee down at baseline     Cervical / Trunk Assessment Cervical / Trunk Assessment: Kyphotic  Communication   Communication: No difficulties  Cognition Arousal/Alertness: Awake/alert Behavior During Therapy: WFL for tasks assessed/performed Overall Cognitive Status: Within Functional Limits for tasks assessed                                        General Comments General comments (skin integrity, edema, etc.): Pt reports the plan is to go home, but is a little concerned she won't be able to get up the step once at the top of the step. Will need to ensure safety with stair management.     Exercises     Assessment/Plan    PT Assessment Patient needs continued PT services  PT Problem List Decreased strength;Decreased range of motion;Decreased activity tolerance;Decreased balance;Decreased mobility;Decreased knowledge of use of DME;Decreased knowledge of precautions;Pain       PT Treatment Interventions DME instruction;Gait training;Stair training;Functional mobility training;Therapeutic activities;Neuromuscular re-education;Therapeutic exercise;Balance training;Patient/family education    PT Goals (Current goals can be found in the Care Plan section)  Acute Rehab PT Goals Patient Stated Goal: to decrease pain  PT Goal Formulation: With patient Time  For Goal Achievement: 07/25/17 Potential to Achieve Goals: Good    Frequency Min 5X/week   Barriers to discharge        Co-evaluation               AM-PAC PT "6 Clicks" Daily Activity  Outcome Measure Difficulty turning over in bed (including adjusting bedclothes, sheets and blankets)?: Unable Difficulty moving from lying on back to sitting on the side of the bed? : Unable Difficulty sitting down on and standing up from a chair with arms (e.g., wheelchair, bedside commode, etc,.)?: Unable Help needed moving to and from a bed to chair (including a wheelchair)?: A Little Help needed walking in hospital room?: A Little Help needed climbing 3-5 steps with a railing? : A Lot 6 Click Score: 11    End of Session Equipment Utilized During Treatment: Gait belt;Right knee immobilizer Activity Tolerance: Patient limited by pain Patient left: in chair;with call bell/phone within reach Nurse Communication: Mobility status;Patient requests pain meds PT Visit Diagnosis: Unsteadiness on feet (R26.81);Other abnormalities of gait and mobility (R26.89);Muscle weakness (generalized) (M62.81);Pain Pain - Right/Left: Right Pain - part of body: Knee    Time: 3818-2993 PT Time Calculation (min) (ACUTE ONLY): 35 min   Charges:   PT Evaluation $PT Eval Moderate Complexity: 1 Mod PT Treatments $Gait Training: 8-22 mins   PT G Codes:  Leighton Ruff, PT, DPT  Acute Rehabilitation Services  Pager: [phone]   Rudean Hitt 07/18/2017, 6:13 PM

## 2017-07-19 ENCOUNTER — Encounter (HOSPITAL_COMMUNITY): Payer: Self-pay | Admitting: Orthopedic Surgery

## 2017-07-19 DIAGNOSIS — E875 Hyperkalemia: Secondary | ICD-10-CM

## 2017-07-19 DIAGNOSIS — N183 Chronic kidney disease, stage 3 (moderate): Secondary | ICD-10-CM

## 2017-07-19 LAB — BASIC METABOLIC PANEL
ANION GAP: 6 (ref 5–15)
BUN: 22 mg/dL — AB (ref 6–20)
CO2: 22 mmol/L (ref 22–32)
Calcium: 8.8 mg/dL — ABNORMAL LOW (ref 8.9–10.3)
Chloride: 107 mmol/L (ref 101–111)
Creatinine, Ser: 1.2 mg/dL — ABNORMAL HIGH (ref 0.44–1.00)
GFR calc Af Amer: 48 mL/min — ABNORMAL LOW (ref >60)
GFR, EST NON AFRICAN AMERICAN: 42 mL/min — AB (ref >60)
Glucose, Bld: 134 mg/dL — ABNORMAL HIGH (ref 65–99)
POTASSIUM: 5.2 mmol/L — AB (ref 3.5–5.1)
Sodium: 135 mmol/L (ref 135–145)

## 2017-07-19 LAB — CBC WITH DIFFERENTIAL/PLATELET
BASOS ABS: 0 10*3/uL (ref 0.0–0.1)
BASOS PCT: 0 %
EOS ABS: 0 10*3/uL (ref 0.0–0.7)
EOS PCT: 0 %
HCT: 26.2 % — ABNORMAL LOW (ref 36.0–46.0)
HEMOGLOBIN: 7.9 g/dL — AB (ref 12.0–15.0)
Lymphocytes Relative: 8 %
Lymphs Abs: 1 10*3/uL (ref 0.7–4.0)
MCH: 26.4 pg (ref 26.0–34.0)
MCHC: 30.2 g/dL (ref 30.0–36.0)
MCV: 87.6 fL (ref 78.0–100.0)
Monocytes Absolute: 0.7 10*3/uL (ref 0.1–1.0)
Monocytes Relative: 6 %
NEUTROS PCT: 86 %
Neutro Abs: 10.6 10*3/uL — ABNORMAL HIGH (ref 1.7–7.7)
PLATELETS: 331 10*3/uL (ref 150–400)
RBC: 2.99 MIL/uL — AB (ref 3.87–5.11)
RDW: 14.6 % (ref 11.5–15.5)
WBC: 12.3 10*3/uL — AB (ref 4.0–10.5)

## 2017-07-19 MED ORDER — HYDRALAZINE HCL 20 MG/ML IJ SOLN: 10.0000 mg | INTRAMUSCULAR | Status: DC | PRN

## 2017-07-19 NOTE — Progress Notes (Signed)
Patient ID: Shelby Reese, female   DOB: 29-Nov-1934, 82 y.o.   MRN: [id number] Postoperative day 1 status post irrigation and debridement for large necrotic wound at the knee. Patient's instillation wound VAC is functioning well.Plan for return to the operating room on Wednesday for further debridement application of a second instillation wound VAC and anticipate split-thickness skin graft on Friday.

## 2017-07-19 NOTE — Progress Notes (Signed)
PROGRESS NOTE    Shelby Reese  XKG:[id number] DOB: Feb 03, 1935 DOA: 07/17/2017 PCP: [name], MD    Brief Narrative:  82 year old female who presents to the leg pain. Patient is known to have hypertension, hypothyroidism and chronic kidney disease. About 2 months prior to hospitalization she developed a right knee traumatic hematoma, spontaneously drain about 3 weeks ago and recently complicated with erythema, pain and worsening edema. She was seen by orthopedics as an outpatient, she was started on antibiotics, with recommendations for surgical debridement. On the initial physical examination blood pressure 165/58, heart rate 97, temperature 98.5,respiratory rate 16, oxygen saturation 98%. Moist mucous membranes, lungs clear to auscultation, heart S1-S2 present and rhythmic, trace lower extremity edema. Right knee with a large ulcerated wound, throughout the right knee, purulent drainage, unstageable. Sodium 134, potassium 4.1, chloride 102, bicarbonate 21, glucose 139, BUN 34, creatinine 1.49, white count 11.4, hemoglobin 9.5, hematocrit 30.9, platelets 373. Urinalysis negative for infection.  Patient admitted to the hospital working diagnosis of large right knee infected ulcerated wound.   Assessment & Plan:   Principal Problem:   Cellulitis Active Problems:   Hypothyroidism, acquired   Anemia, iron deficiency   Essential hypertension   Wound, open, knee, lower leg, or ankle with complication, right, initial encounter   1. Large right knee infected ulcerated wound. SP  irrigation and debridement. Antibiotic therapy with IV ceftriaxone,  and vancomycin. Patient continue afebrile,  Wbc at 12. Will follow on cultures and orthopedics recommendations.   2. Hypertension. Systolic blood pressure 702, patient tolerating well po will discontinue IV fluids, hydralazine, diltiazem. Noted elevated K in the setting of ckd stage 3. Will hold on ace inh, and follow K in am. Add as needed  hydralazine, for blood pressure systolic greater than 637.   3. Chronic anemia with iron deficiency. Continue ferrous sulfate per home regimen, hb post op down to 7.9, cw with postoperative anemia. Will follow cell count in am, if continue drop in cell count will transfuse prbc.   4. Hypothyroidism. Continue with levothyroxine per home regimen  5. New hyperkalemia in the setting of CKD stage 3. Will continue close fallow up of renal function, serum cr at baseline, will hold ace inh and check k in am, if worsening may need kayexalate.    DVT prophylaxis: enoxaparin  Code Status: full Family Communication: no family at bedside  Disposition Plan: home or snf   Consultants:   Orthopedics  Procedures:   I&D right knee 08/30  Antimicrobials:   Ceftriaxone  Vancomycin    Subjective: Moderate pain at the surgical site, improved with analgesics, no nausea or vomiting, no chest pain or dyspnea.   Objective: Vitals:   07/18/17 1240 07/18/17 1500 07/18/17 2255 07/19/17 0650  BP: (!) 128/59 (!) 135/53 (!) 128/43 (!) 144/54  Pulse: 80 75 93 86  Resp: 12 16 16 16   Temp:  98.3 F (36.8 C) 98.7 F (37.1 C) 98.2 F (36.8 C)  TempSrc:  Oral Oral Oral  SpO2: 94% 94% 96% 96%  Weight:      Height:        Intake/Output Summary (Last 24 hours) at 07/19/17 1136 Last data filed at 07/19/17 0900  Gross per 24 hour  Intake           4499.5 ml  Output                0 ml  Net           4499.5  ml   Filed Weights   07/17/17 2046 07/18/17 1016  Weight: 106.6 kg (235 lb) 106.6 kg (235 lb)    Examination:  General: Not in pain or dyspnea Neurology: Awake and alert, non focal  E ENT: mild pallor, no icterus, oral mucosa moist Cardiovascular: S1-S2 present, rhythmic, no gallops, rubs, or murmurs. No jugular venous distention, no lower extremity edema. Pulmonary: vesicular breath sounds bilaterally, adequate air movement, no wheezing, rhonchi or rales. Gastrointestinal.  Abdomen flat, no organomegaly, non tender, no rebound or guarding Skin. No rashes Musculoskeletal: no joint deformities. Right lower extremity with surgical dressing and brace in place.      Data Reviewed: I have personally reviewed following labs and imaging studies  CBC:  Recent Labs Lab 07/17/17 1827 07/18/17 0403 07/19/17 0339  WBC 11.4* 10.0 12.3*  NEUTROABS 8.6*  --  10.6*  HGB 9.5* 8.8* 7.9*  HCT 30.9* 29.5* 26.2*  MCV 87.5 88.1 87.6  PLT 373 332 093   Basic Metabolic Panel:  Recent Labs Lab 07/17/17 1827 07/18/17 0403 07/19/17 0339  NA 134* 137 135  K 4.1 4.3 5.2*  CL 102 107 107  CO2 21* 20* 22  GLUCOSE 139* 107* 134*  BUN 34* 26* 22*  CREATININE 1.49* 1.20* 1.20*  CALCIUM 9.6 9.6 8.8*   GFR: Estimated Creatinine Clearance: 46.1 mL/min (A) (by C-G formula based on SCr of 1.2 mg/dL (H)). Liver Function Tests:  Recent Labs Lab 07/17/17 1827  AST 19  ALT 14  ALKPHOS 56  BILITOT 0.4  PROT 7.7  ALBUMIN 3.7   No results for input(s): LIPASE, AMYLASE in the last 168 hours. No results for input(s): AMMONIA in the last 168 hours. Coagulation Profile: No results for input(s): INR, PROTIME in the last 168 hours. Cardiac Enzymes: No results for input(s): CKTOTAL, CKMB, CKMBINDEX, TROPONINI in the last 168 hours. BNP (last 3 results) No results for input(s): PROBNP in the last 8760 hours. HbA1C: No results for input(s): HGBA1C in the last 72 hours. CBG: No results for input(s): GLUCAP in the last 168 hours. Lipid Profile: No results for input(s): CHOL, HDL, LDLCALC, TRIG, CHOLHDL, LDLDIRECT in the last 72 hours. Thyroid Function Tests: No results for input(s): TSH, T4TOTAL, FREET4, T3FREE, THYROIDAB in the last 72 hours. Anemia Panel: No results for input(s): VITAMINB12, FOLATE, FERRITIN, TIBC, IRON, RETICCTPCT in the last 72 hours.    Radiology Studies: I have reviewed all of the imaging during this hospital visit personally     Scheduled  Meds: . acetaminophen  1,000 mg Oral Daily  . aspirin EC  81 mg Oral Q breakfast  . benazepril  40 mg Oral Q breakfast  . diltiazem  120 mg Oral Q breakfast  . docusate sodium  100 mg Oral BID  . enoxaparin (LOVENOX) injection  40 mg Subcutaneous Q24H  . famotidine  40 mg Oral Q1500  . ferrous sulfate  325 mg Oral QHS  . levothyroxine  175 mcg Oral QAC breakfast  . loratadine  10 mg Oral Daily  . magnesium oxide  200 mg Oral Q1500   Continuous Infusions: . sodium chloride 100 mL/hr at 07/18/17 0354  . sodium chloride 10 mL/hr at 07/18/17 1303  . cefTRIAXone (ROCEPHIN)  IV Stopped (07/19/17 2355)  . lactated ringers Stopped (07/18/17 1141)  . methocarbamol (ROBAXIN)  IV    . vancomycin Stopped (07/19/17 0042)     LOS: 2 days       Christobal Morado Gerome Apley, MD [facility] Pager [phone]

## 2017-07-19 NOTE — Clinical Social Work Note (Signed)
Clinical Social Worker received referral for possible ST-SNF placement.  Chart reviewed.  PT/OT recommending home with home health.  Spoke with RN Case Manager who will follow up with patient to discuss home health needs.    CSW signing off - please re consult if social work needs arise.  Jesse Mariaclara Spear, LCSW [phone] 

## 2017-07-19 NOTE — Evaluation (Signed)
Occupational Therapy Evaluation Patient Details Name: Shelby Reese MRN: [id number] DOB: Aug 16, 1935 Today's Date: 07/19/2017    History of Present Illness Pt is a 82 y/o female admitted secondary to R knee necrotic wound and is s/p I and D with wound vac placement. PMH includes CKD, HTN, fibromyalgia, PVD, and L menisectomy.    Clinical Impression   PTA, pt living with her husband and was independent. Pt reports that her husband will not be able to assist at home. Currently, pt required Mod-Max A for LB ADLs and Min A for functional transfers. Pt would benefit from further acute OT to increase occupational performance and participation. Recommend dc home once medically stable with HHOT to optimize safety and independence with ADL and functional mobility.     Follow Up Recommendations  Home health OT;Supervision/Assistance - 24 hour (Pt declined SNF)    Equipment Recommendations  None recommended by OT    Recommendations for Other Services PT consult     Precautions / Restrictions Precautions Precautions: Fall Required Braces or Orthoses: Knee Immobilizer - Right Knee Immobilizer - Right: On at all times Restrictions Weight Bearing Restrictions: Yes RLE Weight Bearing: Weight bearing as tolerated      Mobility Bed Mobility Overal bed mobility: Needs Assistance Bed Mobility: Sit to Supine       Sit to supine: Min assist   General bed mobility comments: Min A to manage RLEs  Transfers Overall transfer level: Needs assistance Equipment used: Rolling walker (2 wheeled) Transfers: Sit to/from Stand Sit to Stand: Min assist         General transfer comment: Min A to steady and assist with controlled descent. Pt with tendency to "plop" down    Balance Overall balance assessment: Needs assistance Sitting-balance support: No upper extremity supported;Feet supported Sitting balance-Leahy Scale: Fair     Standing balance support: Bilateral upper extremity  supported;During functional activity Standing balance-Leahy Scale: Poor Standing balance comment: Reliant on RW for balance                            ADL either performed or assessed with clinical judgement   ADL Overall ADL's : Needs assistance/impaired Eating/Feeding: Set up;Supervision/ safety;Sitting   Grooming: Brushing hair;Set up;Supervision/safety;Sitting   Upper Body Bathing: Set up;Supervision/ safety;Sitting   Lower Body Bathing: Moderate assistance;Sit to/from stand   Upper Body Dressing : Set up;Supervision/safety;Sitting   Lower Body Dressing: Maximal assistance;Sit to/from stand Lower Body Dressing Details (indicate cue type and reason): Pt can reach down to ankles Toilet Transfer: Minimal assistance;Ambulation;RW (Simulated at EOB) Toilet Transfer Details (indicate cue type and reason): Pt requiring Min A since he has tendency to "plop" on bed         Functional mobility during ADLs: Minimal assistance;Rolling walker General ADL Comments: Pt requiring Mod-Max A for LB ADLs      Vision Baseline Vision/History: Wears glasses (bilateral cataract surgery) Wears Glasses: Reading only Patient Visual Report: No change from baseline       Perception     Praxis      Pertinent Vitals/Pain Pain Assessment: Faces Faces Pain Scale: Hurts little more Pain Location: R knee  Pain Descriptors / Indicators: Aching;Operative site guarding Pain Intervention(s): Monitored during session;Limited activity within patient's tolerance;Repositioned     Hand Dominance Right   Extremity/Trunk Assessment Upper Extremity Assessment Upper Extremity Assessment: Overall WFL for tasks assessed   Lower Extremity Assessment Lower Extremity Assessment: Defer to PT evaluation  Cervical / Trunk Assessment Cervical / Trunk Assessment: Kyphotic   Communication Communication Communication: No difficulties   Cognition Arousal/Alertness: Awake/alert Behavior During  Therapy: WFL for tasks assessed/performed Overall Cognitive Status: Within Functional Limits for tasks assessed                                 General Comments: Pt repeating questions and statements.    General Comments       Exercises    Shoulder Instructions      Home Living Family/patient expects to be discharged to:: Private residence Living Arrangements: Spouse/significant other Available Help at Discharge: Family;Available PRN/intermittently Type of Home: House Home Access: Stairs to enter [employer] of Steps: 2 Entrance Stairs-Rails: None Home Layout: Two level;Bed/bath upstairs Alternate Level Stairs-Number of Steps: 1 Alternate Level Stairs-Rails: Left Bathroom Shower/Tub: Tub/shower unit;Curtain   Bathroom Toilet: Standard     Home Equipment: Walker - standard;Shower seat;Other (comment) (Chair lift)   Additional Comments: Pt reports husband is not of any help at home and complains when she needs him to help her. Reports he takes naps most of the day so may not be able to assist.       Prior Functioning/Environment Level of Independence: Independent with assistive device(s)        Comments: Retired Therapist, sports.        OT Problem List: Decreased strength;Decreased range of motion;Decreased activity tolerance;Impaired balance (sitting and/or standing);Decreased safety awareness;Decreased knowledge of use of DME or AE;Decreased knowledge of precautions;Pain      OT Treatment/Interventions: Self-care/ADL training;Therapeutic exercise;Energy conservation;DME and/or AE instruction;Therapeutic activities;Patient/family education    OT Goals(Current goals can be found in the care plan section) Acute Rehab OT Goals Patient Stated Goal: to decrease pain  OT Goal Formulation: With patient Time For Goal Achievement: 08/02/17 Potential to Achieve Goals: Good ADL Goals Pt Will Perform Lower Body Dressing: with min guard assist;with adaptive  equipment;sit to/from stand Pt Will Transfer to Toilet: with min guard assist;bedside commode;ambulating Pt Will Perform Toileting - Clothing Manipulation and hygiene: with min guard assist;sit to/from stand Pt Will Perform Tub/Shower Transfer: with min guard assist;ambulating;rolling walker;3 in 1;Tub transfer  OT Frequency: Min 2X/week   Barriers to D/C:            Co-evaluation              AM-PAC PT "6 Clicks" Daily Activity     Outcome Measure Help from another person eating meals?: None Help from another person taking care of personal grooming?: None Help from another person toileting, which includes using toliet, bedpan, or urinal?: A Little Help from another person bathing (including washing, rinsing, drying)?: A Lot Help from another person to put on and taking off regular upper body clothing?: None Help from another person to put on and taking off regular lower body clothing?: A Lot 6 Click Score: 19   End of Session Equipment Utilized During Treatment: Gait belt;Rolling walker;Right knee immobilizer Nurse Communication: Mobility status;Other (comment) (Pt states she IV is not running)  Activity Tolerance: Patient limited by fatigue;Patient limited by pain Patient left: in bed;with call bell/phone within reach;with bed alarm set  OT Visit Diagnosis: Unsteadiness on feet (R26.81);Other abnormalities of gait and mobility (R26.89);Muscle weakness (generalized) (M62.81);Pain Pain - Right/Left: Right Pain - part of body: Knee                Time: 0539-7673 OT Time Calculation (min): 44  min Charges:  OT General Charges $OT Visit: 1 Visit OT Evaluation $OT Eval Low Complexity: 1 Low OT Treatments $Self Care/Home Management : 23-37 mins G-Codes:     Itzayanna Kaster MSOT, OTR/L Acute Rehab Pager: [phone] Office: [place] 07/19/2017, 5:30 PM

## 2017-07-19 NOTE — Progress Notes (Signed)
Physical Therapy Treatment Patient Details Name: Shelby Reese MRN: [id number] DOB: 01-14-1935 Today's Date: 07/19/2017    History of Present Illness Pt is a 82 y/o female admitted secondary to R knee necrotic wound and is s/p I and D with wound vac placement. PMH includes CKD, HTN, fibromyalgia, PVD, and L menisectomy.     PT Comments    Pt reports she has been up and down most of the day and she is fatigued at this point.  Pt however was agreeable to supine exercises during session.  Pt appears self limiting and content to refuse OOB mobility despite education on the risks of immobility and the benefits of mobilizing.    Follow Up Recommendations  DC plan and follow up therapy as arranged by surgeon;Supervision/Assistance - 24 hour     Equipment Recommendations  3in1 (PT)    Recommendations for Other Services OT consult     Precautions / Restrictions Precautions Precautions: Fall Required Braces or Orthoses: Knee Immobilizer - Right Knee Immobilizer - Right: On at all times Restrictions Weight Bearing Restrictions: Yes RLE Weight Bearing: Weight bearing as tolerated    Mobility  Bed Mobility          General bed mobility comments: Pt refused OOB mobility during session.  Transfers  Ambulation/Gait                 Stairs            Wheelchair Mobility    Modified Rankin (Stroke Patients Only)       Balance                            Cognition Arousal/Alertness: Awake/alert Behavior During Therapy: WFL for tasks assessed/performed Overall Cognitive Status: Within Functional Limits for tasks assessed                                        Exercises General Exercises - Lower Extremity Ankle Circles/Pumps: AROM;Both;10 reps;Supine (Reports this exercise is useless due to the damage to veins and valves in her lower legs, but patient performed correctly.  ) Quad Sets: AROM;Both;10 reps;Supine Heel Slides:  AROM;Left;10 reps;Supine Hip ABduction/ADduction: AROM;Both;10 reps;Supine Straight Leg Raises: AROM;Both;10 reps;Supine    General Comments        Pertinent Vitals/Pain Pain Assessment: 0-10 Faces Pain Scale: Hurts even more Pain Location: R knee  Pain Descriptors / Indicators: Aching;Operative site guarding Pain Intervention(s): Monitored during session;Repositioned    Home Living Family/patient expects to be discharged to:: Private residence Living Arrangements: Spouse/significant other Available Help at Discharge: Family;Available PRN/intermittently Type of Home: House Home Access: Stairs to enter Entrance Stairs-Rails: None Home Layout: Two level;Bed/bath upstairs Home Equipment: Walker - standard;Shower seat;Other (comment) (Chair lift) Additional Comments: Pt reports husband is not of any help at home and complains when she needs him to help her. Reports he takes naps most of the day so may not be able to assist.     Prior Function Level of Independence: Independent with assistive device(s)      Comments: Retired Therapist, sports.   PT Goals (current goals can now be found in the care plan section) Acute Rehab PT Goals Patient Stated Goal: to decrease pain  Potential to Achieve Goals: Good Progress towards PT goals: Progressing toward goals    Frequency    Min 5X/week  PT Plan Current plan remains appropriate    Co-evaluation              AM-PAC PT "6 Clicks" Daily Activity  Outcome Measure  Difficulty turning over in bed (including adjusting bedclothes, sheets and blankets)?: Unable Difficulty moving from lying on back to sitting on the side of the bed? : Unable Difficulty sitting down on and standing up from a chair with arms (e.g., wheelchair, bedside commode, etc,.)?: Unable Help needed moving to and from a bed to chair (including a wheelchair)?: A Little Help needed walking in hospital room?: A Little Help needed climbing 3-5 steps with a railing? : A  Little 6 Click Score: 12    End of Session Equipment Utilized During Treatment: Gait belt;Right knee immobilizer Activity Tolerance: Patient limited by pain Patient left: in chair;with call bell/phone within reach Nurse Communication: Mobility status;Patient requests pain meds PT Visit Diagnosis: Unsteadiness on feet (R26.81);Other abnormalities of gait and mobility (R26.89);Muscle weakness (generalized) (M62.81);Pain Pain - Right/Left: Right Pain - part of body: Knee     Time: 5449-2010 PT Time Calculation (min) (ACUTE ONLY): 12 min  Charges:  $Therapeutic Exercise: 8-22 mins                    G Codes:       Governor Rooks, PTA pager [phone]    Cristela Blue 07/19/2017, 5:07 PM

## 2017-07-20 LAB — BASIC METABOLIC PANEL
ANION GAP: 7 (ref 5–15)
BUN: 31 mg/dL — ABNORMAL HIGH (ref 6–20)
CO2: 22 mmol/L (ref 22–32)
Calcium: 8.4 mg/dL — ABNORMAL LOW (ref 8.9–10.3)
Chloride: 107 mmol/L (ref 101–111)
Creatinine, Ser: 1.38 mg/dL — ABNORMAL HIGH (ref 0.44–1.00)
GFR, EST AFRICAN AMERICAN: 41 mL/min — AB (ref >60)
GFR, EST NON AFRICAN AMERICAN: 35 mL/min — AB (ref >60)
GLUCOSE: 136 mg/dL — AB (ref 65–99)
POTASSIUM: 4.7 mmol/L (ref 3.5–5.1)
Sodium: 136 mmol/L (ref 135–145)

## 2017-07-20 LAB — CBC WITH DIFFERENTIAL/PLATELET
BASOS ABS: 0 10*3/uL (ref 0.0–0.1)
BASOS PCT: 0 %
Eosinophils Absolute: 0.3 10*3/uL (ref 0.0–0.7)
Eosinophils Relative: 3 %
HEMATOCRIT: 23.7 % — AB (ref 36.0–46.0)
Hemoglobin: 7 g/dL — ABNORMAL LOW (ref 12.0–15.0)
LYMPHS PCT: 18 %
Lymphs Abs: 2 10*3/uL (ref 0.7–4.0)
MCH: 26.3 pg (ref 26.0–34.0)
MCHC: 29.5 g/dL — ABNORMAL LOW (ref 30.0–36.0)
MCV: 89.1 fL (ref 78.0–100.0)
MONO ABS: 1.2 10*3/uL — AB (ref 0.1–1.0)
Monocytes Relative: 11 %
NEUTROS ABS: 7.2 10*3/uL (ref 1.7–7.7)
Neutrophils Relative %: 68 %
PLATELETS: 326 10*3/uL (ref 150–400)
RBC: 2.66 MIL/uL — AB (ref 3.87–5.11)
RDW: 14.8 % (ref 11.5–15.5)
WBC: 10.7 10*3/uL — AB (ref 4.0–10.5)

## 2017-07-20 LAB — ABO/RH: ABO/RH(D): A POS

## 2017-07-20 LAB — PREPARE RBC (CROSSMATCH)

## 2017-07-20 MED ORDER — SODIUM CHLORIDE 0.9 % IV SOLN
Freq: Once | INTRAVENOUS | Status: AC
  Administered 2017-07-20: 16:00:00 via INTRAVENOUS

## 2017-07-20 MED ORDER — FAMOTIDINE 20 MG PO TABS
20.0000 mg | ORAL_TABLET | Freq: Every day | ORAL | Status: DC
  Administered 2017-07-20 – 2017-07-29 (×10): 20 mg via ORAL
  Filled 2017-07-20 (×10): qty 1

## 2017-07-20 NOTE — Progress Notes (Signed)
Patient ID: Shelby Reese, female   DOB: 1935-06-10, 82 y.o.   MRN: [id number] Postoperative day 2 status post debridement necrotic wound right knee. The immobilizer is in place the wound VAC is functioning well. Plan to return the operating room on Wednesday and Friday.

## 2017-07-20 NOTE — Progress Notes (Signed)
PROGRESS NOTE    Shelby Reese  UYQ:[id number] DOB: 07-29-1935 DOA: 07/17/2017 PCP: [name], MD    Brief Narrative:  82 year old female who presents to the leg pain. Patient is known to have hypertension, hypothyroidism and chronic kidney disease. About 2 months prior to hospitalization she developed a right knee traumatic hematoma, spontaneously drain about 3 weeks ago and recently complicated with erythema, pain and worsening edema. She was seen by orthopedics as an outpatient, she was started on antibiotics, with recommendations for surgical debridement. On the initial physical examination blood pressure 165/58, heart rate 97, temperature 98.5,respiratory rate 16, oxygen saturation 98%. Moist mucous membranes, lungs clear to auscultation, heart S1-S2 present and rhythmic, trace lower extremity edema. Right knee with a large ulcerated wound, throughout the right knee, purulent drainage, unstageable. Sodium 134, potassium 4.1, chloride 102, bicarbonate 21, glucose 139, BUN 34, creatinine 1.49, white count 11.4, hemoglobin 9.5, hematocrit 30.9, platelets 373. Urinalysis negative for infection.  Patient admitted to the hospital working diagnosis of large right knee infected ulcerated wound.   Assessment & Plan:   Principal Problem:   Cellulitis Active Problems:   Hypothyroidism, acquired   Anemia, iron deficiency   Essential hypertension   Wound, open, knee, lower leg, or ankle with complication, right, initial encounter  1. Large right knee infected ulcerated wound. SP  irrigation and debridement, continue antibiotic therapy with IV ceftriaxone, and vancomycin. Cultures remained non growth, Wbc at 10.7. Plan to repeat surgical procedure on Wednesday and Friday.  2. Hypertension. Systolic blood pressure 638 to 140, will continue  hydralazine, diltiazem. Continue to hold on ace inh due to hyperkalemia.   3. Acute post operative anemia on chronic anemia with iron deficiency. On  ferrous sulfate per home regimen, hb post op down to 7.0,. Will transfuse one unit prbc and follow on cell count, expect further drop in hb with subsequent surgical procedures.  4. Hypothyroidism. On levothyroxine per home regimen  5. New hyperkalemia in the setting of CKD stage 3. Renal function with stable cr at 1.2 to 1,3. K at 4,7, serum bicarbonate at 22, will follow on renal panel in am, will continue to hold on ace inh. Transfuse one unit prbc.     DVT prophylaxis: enoxaparin Code Status:full Family Communication:no family at bedside  Disposition Plan:home or snf   Consultants:  Orthopedics  Procedures:  I&D right knee 08/30  Antimicrobials:   Ceftriaxone  Vancomycin   Subjective: Pain continue well controlled, no nausea or vomiting, no dyspnea or chest pain.   Objective: Vitals:   07/18/17 2255 07/19/17 0650 07/19/17 2121 07/20/17 0550  BP: (!) 128/43 (!) 144/54 (!) 140/50 (!) 139/46  Pulse: 93 86 88 83  Resp: 16 16    Temp: 98.7 F (37.1 C) 98.2 F (36.8 C) 99.2 F (37.3 C) 99.1 F (37.3 C)  TempSrc: Oral Oral Oral Oral  SpO2: 96% 96% 95% 94%  Weight:      Height:        Intake/Output Summary (Last 24 hours) at 07/20/17 1334 Last data filed at 07/20/17 0900  Gross per 24 hour  Intake              480 ml  Output              800 ml  Net             -320 ml   Filed Weights   07/17/17 2046 07/18/17 1016  Weight: 106.6 kg (235 lb) 106.6 kg (  235 lb)    Examination:  General: Not in pain or dyspnea Neurology: Awake and alert, non focal  E ENT: mild pallor, no icterus, oral mucosa moist Cardiovascular: S1-S2 present, rhythmic, no gallops, rubs, or murmurs. No jugular venous distention, no lower extremity edema. Pulmonary: vesicular breath sounds bilaterally, adequate air movement, no wheezing, rhonchi or rales. Gastrointestinal. Abdomen flat, no organomegaly, non tender, no rebound or guarding Skin. No rashes Musculoskeletal: no  joint deformities, right lower extremity with brace in place.      Data Reviewed: I have personally reviewed following labs and imaging studies  CBC:  Recent Labs Lab 07/17/17 1827 07/18/17 0403 07/19/17 0339 07/20/17 0423  WBC 11.4* 10.0 12.3* 10.7*  NEUTROABS 8.6*  --  10.6* 7.2  HGB 9.5* 8.8* 7.9* 7.0*  HCT 30.9* 29.5* 26.2* 23.7*  MCV 87.5 88.1 87.6 89.1  PLT 373 332 331 735   Basic Metabolic Panel:  Recent Labs Lab 07/17/17 1827 07/18/17 0403 07/19/17 0339 07/20/17 0423  NA 134* 137 135 136  K 4.1 4.3 5.2* 4.7  CL 102 107 107 107  CO2 21* 20* 22 22  GLUCOSE 139* 107* 134* 136*  BUN 34* 26* 22* 31*  CREATININE 1.49* 1.20* 1.20* 1.38*  CALCIUM 9.6 9.6 8.8* 8.4*   GFR: Estimated Creatinine Clearance: 40.1 mL/min (A) (by C-G formula based on SCr of 1.38 mg/dL (H)). Liver Function Tests:  Recent Labs Lab 07/17/17 1827  AST 19  ALT 14  ALKPHOS 56  BILITOT 0.4  PROT 7.7  ALBUMIN 3.7   No results for input(s): LIPASE, AMYLASE in the last 168 hours. No results for input(s): AMMONIA in the last 168 hours. Coagulation Profile: No results for input(s): INR, PROTIME in the last 168 hours. Cardiac Enzymes: No results for input(s): CKTOTAL, CKMB, CKMBINDEX, TROPONINI in the last 168 hours. BNP (last 3 results) No results for input(s): PROBNP in the last 8760 hours. HbA1C: No results for input(s): HGBA1C in the last 72 hours. CBG: No results for input(s): GLUCAP in the last 168 hours. Lipid Profile: No results for input(s): CHOL, HDL, LDLCALC, TRIG, CHOLHDL, LDLDIRECT in the last 72 hours. Thyroid Function Tests: No results for input(s): TSH, T4TOTAL, FREET4, T3FREE, THYROIDAB in the last 72 hours. Anemia Panel: No results for input(s): VITAMINB12, FOLATE, FERRITIN, TIBC, IRON, RETICCTPCT in the last 72 hours.    Radiology Studies: I have reviewed all of the imaging during this hospital visit personally     Scheduled Meds: . acetaminophen  1,000  mg Oral Daily  . aspirin EC  81 mg Oral Q breakfast  . diltiazem  120 mg Oral Q breakfast  . docusate sodium  100 mg Oral BID  . enoxaparin (LOVENOX) injection  40 mg Subcutaneous Q24H  . famotidine  20 mg Oral Q1500  . ferrous sulfate  325 mg Oral QHS  . levothyroxine  175 mcg Oral QAC breakfast  . loratadine  10 mg Oral Daily  . magnesium oxide  200 mg Oral Q1500   Continuous Infusions: . sodium chloride 10 mL/hr at 07/18/17 1303  . cefTRIAXone (ROCEPHIN)  IV 2 g (07/20/17 0520)  . lactated ringers Stopped (07/18/17 1141)  . methocarbamol (ROBAXIN)  IV    . vancomycin Stopped (07/19/17 2233)     LOS: 3 days      Mauricio Gerome Apley, MD [facility] Pager [phone]

## 2017-07-20 NOTE — Plan of Care (Signed)
Problem: Pain Managment: Goal: General experience of comfort will improve Patient voices understanding of pain scale and calls for medication when needed.   

## 2017-07-21 LAB — BASIC METABOLIC PANEL
ANION GAP: 8 (ref 5–15)
BUN: 30 mg/dL — ABNORMAL HIGH (ref 6–20)
CALCIUM: 8.4 mg/dL — AB (ref 8.9–10.3)
CO2: 21 mmol/L — ABNORMAL LOW (ref 22–32)
Chloride: 107 mmol/L (ref 101–111)
Creatinine, Ser: 1.44 mg/dL — ABNORMAL HIGH (ref 0.44–1.00)
GFR calc Af Amer: 39 mL/min — ABNORMAL LOW (ref >60)
GFR, EST NON AFRICAN AMERICAN: 34 mL/min — AB (ref >60)
GLUCOSE: 97 mg/dL (ref 65–99)
POTASSIUM: 4.4 mmol/L (ref 3.5–5.1)
SODIUM: 136 mmol/L (ref 135–145)

## 2017-07-21 LAB — TYPE AND SCREEN
ABO/RH(D): A POS
ANTIBODY SCREEN: NEGATIVE
Unit division: 0

## 2017-07-21 LAB — CBC WITH DIFFERENTIAL/PLATELET
BASOS ABS: 0 10*3/uL (ref 0.0–0.1)
BASOS PCT: 0 %
EOS ABS: 0.5 10*3/uL (ref 0.0–0.7)
EOS PCT: 5 %
HCT: 29 % — ABNORMAL LOW (ref 36.0–46.0)
Hemoglobin: 8.6 g/dL — ABNORMAL LOW (ref 12.0–15.0)
LYMPHS PCT: 22 %
Lymphs Abs: 2.2 10*3/uL (ref 0.7–4.0)
MCH: 26.5 pg (ref 26.0–34.0)
MCHC: 29.7 g/dL — ABNORMAL LOW (ref 30.0–36.0)
MCV: 89.5 fL (ref 78.0–100.0)
MONO ABS: 0.8 10*3/uL (ref 0.1–1.0)
Monocytes Relative: 8 %
Neutro Abs: 6.4 10*3/uL (ref 1.7–7.7)
Neutrophils Relative %: 65 %
PLATELETS: 299 10*3/uL (ref 150–400)
RBC: 3.24 MIL/uL — AB (ref 3.87–5.11)
RDW: 15 % (ref 11.5–15.5)
WBC: 9.9 10*3/uL (ref 4.0–10.5)

## 2017-07-21 LAB — VANCOMYCIN, TROUGH: Vancomycin Tr: 21 ug/mL (ref 15–20)

## 2017-07-21 LAB — BPAM RBC
Blood Product Expiration Date: 201809202359
ISSUE DATE / TIME: 201809021727
UNIT TYPE AND RH: 6200

## 2017-07-21 MED ORDER — VANCOMYCIN HCL IN DEXTROSE 1-5 GM/200ML-% IV SOLN
1000.0000 mg | INTRAVENOUS | Status: DC
  Administered 2017-07-21 – 2017-07-25 (×5): 1000 mg via INTRAVENOUS
  Filled 2017-07-21 (×4): qty 200

## 2017-07-21 MED ORDER — VANCOMYCIN HCL IN DEXTROSE 750-5 MG/150ML-% IV SOLN
750.0000 mg | INTRAVENOUS | Status: DC
  Filled 2017-07-21: qty 150

## 2017-07-21 MED ORDER — DIPHENHYDRAMINE HCL 25 MG PO CAPS
25.0000 mg | ORAL_CAPSULE | Freq: Four times a day (QID) | ORAL | Status: DC | PRN
  Administered 2017-07-21 – 2017-07-27 (×2): 25 mg via ORAL
  Filled 2017-07-21 (×2): qty 1

## 2017-07-21 NOTE — Progress Notes (Signed)
Patient ID: Shelby Reese, female   DOB: Sep 18, 1935, 82 y.o.   MRN: [id number] Patient is status post debridement of necrotic wound right knee.plan for continue with the instillation wound VAC and return to the operating room on Wednesday for evaluation of skin graft versus continuation of the instillation wound VAC.

## 2017-07-21 NOTE — Progress Notes (Signed)
Physical Therapy Treatment Patient Details Name: Shelby Reese MRN: [id number] DOB: 03-31-1935 Today's Date: 07/21/2017    History of Present Illness Pt is a 82 y/o female admitted secondary to R knee necrotic wound and is s/p I and D with wound vac placement. PMH includes CKD, HTN, fibromyalgia, PVD, and L menisectomy.     PT Comments    Pt performed increased activity during session this am.  Pt required cues for encouragement to participate.  Pt able to advance gait with noticeable weakness and fatigue to RLE.  Pt required cues for safety during session with minimal carryover. Plan next session to continue gait and transfer training.     Follow Up Recommendations  DC plan and follow up therapy as arranged by surgeon;Supervision/Assistance - 24 hour     Equipment Recommendations  3in1 (PT)    Recommendations for Other Services OT consult     Precautions / Restrictions Precautions Precautions: Fall Required Braces or Orthoses: Knee Immobilizer - Right Knee Immobilizer - Right: On at all times Restrictions Weight Bearing Restrictions: Yes RLE Weight Bearing: Weight bearing as tolerated    Mobility  Bed Mobility Overal bed mobility: Needs Assistance Bed Mobility: Supine to Sit;Sit to Supine     Supine to sit: Min assist Sit to supine: Min assist   General bed mobility comments: Min A to manage RLE into and out of bed.  Pt required cues for problem solving to advance to edge of bed.  Max VCs for technique.    Transfers Overall transfer level: Needs assistance Equipment used: Rolling walker (2 wheeled) Transfers: Sit to/from Stand Sit to Stand: Min assist         General transfer comment: Pt attempted first transfer and she was unsuccessful in her attempt.  Pt then required min assist to boost into standing.  During descent to seated surface, pt remains to load her weight poorly and continue to "plop" down, throwing her weight posterior.  Pt educated on hand placement  but remains to pull on RW and keep hold of RW when returning to seated position.    Ambulation/Gait Ambulation/Gait assistance: Min guard Ambulation Distance (Feet): 100 Feet (x2 trials with standing rest break between trials.  ) Assistive device: Rolling walker (2 wheeled) (bariatric.  ) Gait Pattern/deviations: Step-through pattern;Decreased stride length;Shuffle;Trunk flexed Gait velocity: Decreased Gait velocity interpretation: Below normal speed for age/gender General Gait Details: Pt with poor foot clearance on R due to Knee immobilaztion.  Pt required cues for upper trunk control and forward gaze.  Cues for RW safety and assist to manage lines and leads.     Stairs            Wheelchair Mobility    Modified Rankin (Stroke Patients Only)       Balance Overall balance assessment: Needs assistance   Sitting balance-Leahy Scale: Fair       Standing balance-Leahy Scale: Fair Standing balance comment: Reliant on RW for balance                             Cognition Arousal/Alertness: Awake/alert Behavior During Therapy: WFL for tasks assessed/performed Overall Cognitive Status: Within Functional Limits for tasks assessed                                        Exercises      General  Comments        Pertinent Vitals/Pain Pain Assessment: 0-10 Pain Score: 3  Pain Location: R knee  Pain Descriptors / Indicators: Aching;Operative site guarding Pain Intervention(s): Monitored during session;Repositioned    Home Living                      Prior Function            PT Goals (current goals can now be found in the care plan section) Acute Rehab PT Goals Patient Stated Goal: to decrease pain  Potential to Achieve Goals: Good Progress towards PT goals: Progressing toward goals    Frequency    Min 5X/week      PT Plan Current plan remains appropriate    Co-evaluation              AM-PAC PT "6 Clicks"  Daily Activity  Outcome Measure  Difficulty turning over in bed (including adjusting bedclothes, sheets and blankets)?: Unable Difficulty moving from lying on back to sitting on the side of the bed? : Unable Difficulty sitting down on and standing up from a chair with arms (e.g., wheelchair, bedside commode, etc,.)?: Unable Help needed moving to and from a bed to chair (including a wheelchair)?: A Lot Help needed walking in hospital room?: A Little Help needed climbing 3-5 steps with a railing? : A Lot 6 Click Score: 10    End of Session Equipment Utilized During Treatment: Gait belt;Right knee immobilizer Activity Tolerance: Patient limited by pain Patient left: with call bell/phone within reach;in bed Nurse Communication: Mobility status;Patient requests pain meds PT Visit Diagnosis: Unsteadiness on feet (R26.81);Other abnormalities of gait and mobility (R26.89);Muscle weakness (generalized) (M62.81);Pain Pain - Right/Left: Right Pain - part of body: Knee     Time: 7680-8811 PT Time Calculation (min) (ACUTE ONLY): 33 min  Charges:  $Gait Training: 8-22 mins $Therapeutic Activity: 8-22 mins                    G Codes:       Governor Rooks, PTA pager [phone]    Cristela Blue 07/21/2017, 11:26 AM

## 2017-07-21 NOTE — Progress Notes (Signed)
PROGRESS NOTE    Shelby Reese  VPX:[id number] DOB: 1935/08/28 DOA: 07/17/2017 PCP: [name], MD    Brief Narrative:  82 year old female who presents to the leg pain. Patient is known to have hypertension, hypothyroidism and chronic kidney disease. About 2 months prior to hospitalization she developed a right knee traumatic hematoma, spontaneously drain about 3 weeks ago and recently complicated with erythema, pain and worsening edema. She was seen by orthopedics as an outpatient, she was started on antibiotics, with recommendations for surgical debridement. On the initial physical examination blood pressure 165/58, heart rate 97, temperature 98.5,respiratory rate 16, oxygen saturation 98%. Moist mucous membranes, lungs clear to auscultation, heart S1-S2 present and rhythmic, trace lower extremity edema. Right knee with a large ulcerated wound, throughout the right knee, purulent drainage, unstageable. Sodium 134, potassium 4.1, chloride 102, bicarbonate 21, glucose 139, BUN 34, creatinine 1.49, white count 11.4, hemoglobin 9.5, hematocrit 30.9, platelets 373. Urinalysis negative for infection.  Patient admitted to the hospital working diagnosis of large right knee infected ulcerated wound.   Assessment & Plan:   Principal Problem:   Cellulitis Active Problems:   Hypothyroidism, acquired   Anemia, iron deficiency   Essential hypertension   Wound, open, knee, lower leg, or ankle with complication, right, initial encounter  1. Large right knee infected ulcerated wound. SPirrigation and debridement 08/30,  antibiotic therapy with IV ceftriaxone, and vancomycin #3. Cultures remained non growth. For surgical procedure on Wednesday and Friday. Continue to follow orthopedic recommendations. No fever.   2. Hypertension. Systolic blood pressure 462 to 160, will continue as needed hydralazine, and scheduled diltiazem. Off ace inh due to hyperkalemia.  3. Acute post operative anemia on  chronic anemia with iron deficiency. Continue with PO ferrous sulfate per home regimen. Patient had one unit prbc transfused with improvement of hb up to 8,6 with hct at 29.   4. Hypothyroidism. Continue with levothyroxine.   5. New hyperkalemia in the setting of CKD stage 3. Renal function with stable cr at 1.4 K improved at 4,4, will continue to hold on ace inh for now. WIll follow renal panel in am, vancomycin levels per pharmacy protocol.   DVT prophylaxis: enoxaparin Code Status:full Family Communication:no family at bedside  Disposition Plan:home or snf   Consultants:  Orthopedics  Procedures:  I&D right knee 08/30  Antimicrobials:   Ceftriaxone  Vancomycin    Subjective: Patient with right knee pain controlled, no nausea or vomiting and tolerating po well, no dyspnea or chest pain.   Objective: Vitals:   07/20/17 2003 07/20/17 2059 07/21/17 0015 07/21/17 0602  BP: (!) 157/56 (!) 160/57 (!) 161/51 (!) 156/60  Pulse: 92 91 82 93  Resp: 17 17 17 16   Temp: 100 F (37.8 C) 99 F (37.2 C) 98.3 F (36.8 C) 98.9 F (37.2 C)  TempSrc: Oral Oral Axillary Oral  SpO2: 98%  94% 93%  Weight:      Height:        Intake/Output Summary (Last 24 hours) at 07/21/17 1223 Last data filed at 07/21/17 0341  Gross per 24 hour  Intake              825 ml  Output               50 ml  Net              775 ml   Filed Weights   07/17/17 2046 07/18/17 1016  Weight: 106.6 kg (235 lb) 106.6 kg (  235 lb)    Examination:  General: deconditioned Neurology: Awake and alert, non focal  E ENT: no pallor, no icterus, oral mucosa moist Cardiovascular: S1-S2 present, rhythmic, no gallops, rubs, or murmurs. No jugular venous distention, no lower extremity edema. Pulmonary: vesicular breath sounds bilaterally, adequate air movement, no wheezing, rhonchi or rales. Gastrointestinal. Abdomen flat, no organomegaly, non tender, no rebound or guarding Skin. No  rashes Musculoskeletal: right lower extremity with brace in place.      Data Reviewed: I have personally reviewed following labs and imaging studies  CBC:  Recent Labs Lab 07/17/17 1827 07/18/17 0403 07/19/17 0339 07/20/17 0423 07/21/17 0252  WBC 11.4* 10.0 12.3* 10.7* 9.9  NEUTROABS 8.6*  --  10.6* 7.2 6.4  HGB 9.5* 8.8* 7.9* 7.0* 8.6*  HCT 30.9* 29.5* 26.2* 23.7* 29.0*  MCV 87.5 88.1 87.6 89.1 89.5  PLT 373 332 331 326 034   Basic Metabolic Panel:  Recent Labs Lab 07/17/17 1827 07/18/17 0403 07/19/17 0339 07/20/17 0423 07/21/17 0252  NA 134* 137 135 136 136  K 4.1 4.3 5.2* 4.7 4.4  CL 102 107 107 107 107  CO2 21* 20* 22 22 21*  GLUCOSE 139* 107* 134* 136* 97  BUN 34* 26* 22* 31* 30*  CREATININE 1.49* 1.20* 1.20* 1.38* 1.44*  CALCIUM 9.6 9.6 8.8* 8.4* 8.4*   GFR: Estimated Creatinine Clearance: 38.4 mL/min (A) (by C-G formula based on SCr of 1.44 mg/dL (H)). Liver Function Tests:  Recent Labs Lab 07/17/17 1827  AST 19  ALT 14  ALKPHOS 56  BILITOT 0.4  PROT 7.7  ALBUMIN 3.7   No results for input(s): LIPASE, AMYLASE in the last 168 hours. No results for input(s): AMMONIA in the last 168 hours. Coagulation Profile: No results for input(s): INR, PROTIME in the last 168 hours. Cardiac Enzymes: No results for input(s): CKTOTAL, CKMB, CKMBINDEX, TROPONINI in the last 168 hours. BNP (last 3 results) No results for input(s): PROBNP in the last 8760 hours. HbA1C: No results for input(s): HGBA1C in the last 72 hours. CBG: No results for input(s): GLUCAP in the last 168 hours. Lipid Profile: No results for input(s): CHOL, HDL, LDLCALC, TRIG, CHOLHDL, LDLDIRECT in the last 72 hours. Thyroid Function Tests: No results for input(s): TSH, T4TOTAL, FREET4, T3FREE, THYROIDAB in the last 72 hours. Anemia Panel: No results for input(s): VITAMINB12, FOLATE, FERRITIN, TIBC, IRON, RETICCTPCT in the last 72 hours.    Radiology Studies: I have reviewed all of  the imaging during this hospital visit personally     Scheduled Meds: . acetaminophen  1,000 mg Oral Daily  . aspirin EC  81 mg Oral Q breakfast  . diltiazem  120 mg Oral Q breakfast  . docusate sodium  100 mg Oral BID  . enoxaparin (LOVENOX) injection  40 mg Subcutaneous Q24H  . famotidine  20 mg Oral Q1500  . ferrous sulfate  325 mg Oral QHS  . levothyroxine  175 mcg Oral QAC breakfast  . loratadine  10 mg Oral Daily  . magnesium oxide  200 mg Oral Q1500   Continuous Infusions: . sodium chloride 10 mL/hr at 07/18/17 1303  . cefTRIAXone (ROCEPHIN)  IV Stopped (07/21/17 0654)  . lactated ringers Stopped (07/18/17 1141)  . methocarbamol (ROBAXIN)  IV    . vancomycin Stopped (07/20/17 2243)     LOS: 4 days       Keshonda Monsour Gerome Apley, MD [facility] Pager [phone]

## 2017-07-21 NOTE — Progress Notes (Signed)
Pharmacy Antibiotic Note  Shelby Reese is a 82 y.o. female admitted on 07/17/2017 with cellulitis.  Pharmacy was consulted on 8/30 for Vancomycin, ceftriaxone dosing for large right knee infected wound/cellulitis.   Vanc trough tonight = 21 mcg/ml on Vanc IV 1250mg  q24h, this is a steady state trough, is slightly above goal 15-20 mcg/ml.  WBC wnl, afebile  S/p I&D of necrotic wound right knee. Ortho plans to continue with the instillation wound VAC and return to [place] 9/5 for evaluation of skin graft versus continuation of the instillation wound VAC. Scr 1.44, CrCl ~ 38 ml/min 8/30 BCx x2: ngtd x 3  Plan: Decrease Vancomycin to 1000 IV every 24 hours.  Goal trough 15-20 mcg/ml Continue Ceftriaxone 2gm iv q24hr.  Check vanc trough at steady state for the adjusted vanc dose.  Monitor renal function, culture results.   Height: 5\' 5"  (165.1 cm) Weight: 235 lb (106.6 kg) IBW/kg (Calculated) : 57  Temp (24hrs), Avg:98.7 F (37.1 C), Min:98.3 F (36.8 C), Max:99.2 F (37.3 C)   Recent Labs Lab 07/17/17 1827 07/17/17 1839 07/18/17 0403 07/19/17 0339 07/20/17 0423 07/21/17 0252 07/21/17 2040  WBC 11.4*  --  10.0 12.3* 10.7* 9.9  --   CREATININE 1.49*  --  1.20* 1.20* 1.38* 1.44*  --   LATICACIDVEN  --  0.83  --   --   --   --   --   VANCOTROUGH  --   --   --   --   --   --  21*    Estimated Creatinine Clearance: 38.4 mL/min (A) (by C-G formula based on SCr of 1.44 mg/dL (H)).    Allergies  Allergen Reactions  . Bactrim [Sulfamethoxazole-Trimethoprim] Diarrhea and Nausea Only  . Ciprofloxacin Other (See Comments)    tremors  . Diovan [Valsartan] Other (See Comments)    Extreme vertigo  . Food     Orange Juice-upset stomach/diarrhea  . Latex Other (See Comments)    Rash/inflammation due to exposure  . Nitrofuran Derivatives Hives    "Full body rash"  . Penicillins Hives and Swelling    Has patient had a PCN reaction causing immediate rash, facial/tongue/throat swelling,  SOB or lightheadedness with hypotension:No--severe irritation at the injection site Has patient had a PCN reaction causing severe rash involving mucus membranes or skin necrosis:Unknown Has patient had a PCN reaction that required hospitalization:No Has patient had a PCN reaction occurring within the last 10 years:Yes If all of the above answers are "NO", then may proceed with Cephalosporin use.   . Sulfa Antibiotics Diarrhea and Nausea Only  . Other Rash    Mycins    Antimicrobials this admission: 8/30 vanc>> 8/30 cefepime>> x1 8/30 flagyl>> x1 8/31 Ceftriaxone >>  Dose adjustments this admission: 9/3 VT = 21 on 1250 mg IV q24h;  Decreased dose to 1000 mg IV q24h  Microbiology results: 8/30 BCx x2: ngtd x 3 8/31 MRSA PCR: negative  Thank you for allowing pharmacy to be a part of this patient's care. Nicole Cella, RPh Clinical Pharmacist Pager: [phone] 07/21/2017 9:39 PM

## 2017-07-21 NOTE — Care Management Important Message (Signed)
Important Message  Patient Details  Name: Shelby Reese MRN: [id number] Date of Birth: [birth year]   Medicare Important Message Given:  Yes    Khaila Velarde 07/21/2017, 11:24 AM

## 2017-07-22 ENCOUNTER — Encounter (HOSPITAL_COMMUNITY): Payer: Self-pay | Admitting: *Deleted

## 2017-07-22 NOTE — Progress Notes (Signed)
PROGRESS NOTE    SYNTHIA FAIRBANK  QJF:[id number] DOB: 1935/06/14 DOA: 07/17/2017 PCP: [name], MD    Brief Narrative:  82 year old female who presents to the leg pain. Patient is known to have hypertension, hypothyroidism and chronic kidney disease. About 2 months prior to hospitalization she developed a right knee traumatic hematoma, spontaneously drain about 3 weeks ago and recently complicated with erythema, pain and worsening edema. She was seen by orthopedics as an outpatient, she was started on antibiotics, with recommendations for surgical debridement. On the initial physical examination blood pressure 165/58, heart rate 97, temperature 98.5,respiratory rate 16, oxygen saturation 98%. Moist mucous membranes, lungs clear to auscultation, heart S1-S2 present and rhythmic, trace lower extremity edema. Right knee with a large ulcerated wound, throughout the right knee, purulent drainage, unstageable. Sodium 134, potassium 4.1, chloride 102, bicarbonate 21, glucose 139, BUN 34, creatinine 1.49, white count 11.4, hemoglobin 9.5, hematocrit 30.9, platelets 373. Urinalysis negative for infection.  Patient admitted to the hospital working diagnosis of large right knee infected ulcerated wound.  Patient intervened by orthopedics on 08/30, planned subsequent surgeries on 09/5 and 09/7.    Assessment & Plan:   Principal Problem:   Cellulitis Active Problems:   Hypothyroidism, acquired   Anemia, iron deficiency   Essential hypertension   Wound, open, knee, lower leg, or ankle with complication, right, initial encounter  1. Large right knee infected ulcerated wound. Patient hadirrigation and debridement 08/30, continue antibiotic therapy with IV ceftriaxone, and vancomycin #4. Blood cultures have remained with no growth. Continue pain control. Wbc 9,9. Patient afebrile.   2. Hypertension. Blood pressure 130 to 150, continue diltiazem and as needed hydralazine. Continue to hold on ace inh  due to hyperkalemia.  3. Acute post operative anemia on chronic anemia with iron deficiency. SP one unit prbc transfused. Will follow cell count in am.    4. Hypothyroidism. Continue withlevothyroxine, per home regimen.   5.  CKD stage 3. Patient tolerating po well, off IV fluids, will follow on renal panel in am, avoid hypotension or nephrotoxic medications. Follow vancomycin level per pharmacy protocol, vanc level 21.   DVT prophylaxis: enoxaparin Code Status:full Family Communication:no family at bedside  Disposition Plan:home or snf   Consultants:  Orthopedics  Procedures:  I&D right knee 08/30  Antimicrobials:   Ceftriaxone  Vancomycin    Subjective: Patient not feeling well in general, no specific symptoms, no nausea or vomiting, no chest pain or dyspnea. Positive pain at the back of her right knee, dull in nature, moderate to severe, persistent with no radiation.   Objective: Vitals:   07/21/17 1546 07/21/17 1609 07/21/17 2102 07/22/17 0446  BP: (!) 152/41 (!) 148/60 134/62 (!) 150/66  Pulse:   90 87  Resp:   17 20  Temp:   99.2 F (37.3 C) 98.2 F (36.8 C)  TempSrc:   Oral Oral  SpO2:   96% 97%  Weight:      Height:        Intake/Output Summary (Last 24 hours) at 07/22/17 1146 Last data filed at 07/22/17 0523  Gross per 24 hour  Intake              752 ml  Output                0 ml  Net              752 ml   Filed Weights   07/17/17 2046 07/18/17 1016  Weight: 106.6  kg (235 lb) 106.6 kg (235 lb)    Examination:  General: deconditioned Neurology: Awake and alert, non focal  E ENT: mild pallor, no icterus, oral mucosa moist Cardiovascular: S1-S2 present, rhythmic, no gallops, rubs, or murmurs. No jugular venous distention, no lower extremity edema. Pulmonary: vesicular breath sounds bilaterally, adequate air movement, no wheezing, rhonchi or rales. Gastrointestinal. Abdomen flat, no organomegaly, non tender, no rebound or  guarding Skin. No rashes Musculoskeletal: right lower extremity with brace in place.      Data Reviewed: I have personally reviewed following labs and imaging studies  CBC:  Recent Labs Lab 07/17/17 1827 07/18/17 0403 07/19/17 0339 07/20/17 0423 07/21/17 0252  WBC 11.4* 10.0 12.3* 10.7* 9.9  NEUTROABS 8.6*  --  10.6* 7.2 6.4  HGB 9.5* 8.8* 7.9* 7.0* 8.6*  HCT 30.9* 29.5* 26.2* 23.7* 29.0*  MCV 87.5 88.1 87.6 89.1 89.5  PLT 373 332 331 326 161   Basic Metabolic Panel:  Recent Labs Lab 07/17/17 1827 07/18/17 0403 07/19/17 0339 07/20/17 0423 07/21/17 0252  NA 134* 137 135 136 136  K 4.1 4.3 5.2* 4.7 4.4  CL 102 107 107 107 107  CO2 21* 20* 22 22 21*  GLUCOSE 139* 107* 134* 136* 97  BUN 34* 26* 22* 31* 30*  CREATININE 1.49* 1.20* 1.20* 1.38* 1.44*  CALCIUM 9.6 9.6 8.8* 8.4* 8.4*   GFR: Estimated Creatinine Clearance: 38.4 mL/min (A) (by C-G formula based on SCr of 1.44 mg/dL (H)). Liver Function Tests:  Recent Labs Lab 07/17/17 1827  AST 19  ALT 14  ALKPHOS 56  BILITOT 0.4  PROT 7.7  ALBUMIN 3.7   No results for input(s): LIPASE, AMYLASE in the last 168 hours. No results for input(s): AMMONIA in the last 168 hours. Coagulation Profile: No results for input(s): INR, PROTIME in the last 168 hours. Cardiac Enzymes: No results for input(s): CKTOTAL, CKMB, CKMBINDEX, TROPONINI in the last 168 hours. BNP (last 3 results) No results for input(s): PROBNP in the last 8760 hours. HbA1C: No results for input(s): HGBA1C in the last 72 hours. CBG: No results for input(s): GLUCAP in the last 168 hours. Lipid Profile: No results for input(s): CHOL, HDL, LDLCALC, TRIG, CHOLHDL, LDLDIRECT in the last 72 hours. Thyroid Function Tests: No results for input(s): TSH, T4TOTAL, FREET4, T3FREE, THYROIDAB in the last 72 hours. Anemia Panel: No results for input(s): VITAMINB12, FOLATE, FERRITIN, TIBC, IRON, RETICCTPCT in the last 72 hours.    Radiology Studies: I have  reviewed all of the imaging during this hospital visit personally     Scheduled Meds: . aspirin EC  81 mg Oral Q breakfast  . diltiazem  120 mg Oral Q breakfast  . docusate sodium  100 mg Oral BID  . enoxaparin (LOVENOX) injection  40 mg Subcutaneous Q24H  . famotidine  20 mg Oral Q1500  . ferrous sulfate  325 mg Oral QHS  . levothyroxine  175 mcg Oral QAC breakfast  . magnesium oxide  200 mg Oral Q1500   Continuous Infusions: . sodium chloride 10 mL/hr at 07/18/17 1303  . cefTRIAXone (ROCEPHIN)  IV Stopped (07/22/17 0900)  . methocarbamol (ROBAXIN)  IV    . vancomycin Stopped (07/21/17 2358)     LOS: 5 days     [name] Gerome Apley, MD [facility] Pager [phone]

## 2017-07-22 NOTE — Progress Notes (Signed)
Occupational Therapy Treatment Patient Details Name: Shelby Reese MRN: [id number] DOB: Feb 22, 1935 Today's Date: 07/22/2017    History of present illness Pt is a 82 y/o female admitted secondary to R knee necrotic wound and is s/p I and D with wound vac placement. PMH includes CKD, HTN, fibromyalgia, PVD, and L menisectomy.    OT comments  Pt progressing towards goals. Completed room level functional mobility, toileting, and standing grooming ADLs at RW level with overall MinGuard assist. Requires verbal cues for hand placement during transfers and increased time to complete tasks. Will continue to follow to progress Pt's safety and independence with ADLs and functional mobility prior to discharge.    Follow Up Recommendations  Home health OT;Supervision/Assistance - 24 hour (Pt declining SNF )    Equipment Recommendations  None recommended by OT          Precautions / Restrictions Precautions Precautions: Fall Required Braces or Orthoses: Knee Immobilizer - Right Knee Immobilizer - Right: On at all times Restrictions Weight Bearing Restrictions: Yes RLE Weight Bearing: Weight bearing as tolerated       Mobility Bed Mobility Overal bed mobility: Needs Assistance Bed Mobility: Supine to Sit     Supine to sit: Min assist;HOB elevated Sit to supine: Min assist   General bed mobility comments: MinA to guide RLE over EOB, increased time and HOB elevated, use of handrail   Transfers Overall transfer level: Needs assistance Equipment used: Rolling walker (2 wheeled) (bariatric RW ) Transfers: Sit to/from Stand Sit to Stand: Min guard         General transfer comment: Close guard for safety; Pt proceeds to pull up on RW despite verbal cues for hand placement during rise and descent    Balance Overall balance assessment: Needs assistance Sitting-balance support: No upper extremity supported;Feet supported Sitting balance-Leahy Scale: Good     Standing balance support:  Bilateral upper extremity supported;During functional activity Standing balance-Leahy Scale: Fair Standing balance comment: Reliant on RW for balance; utilizes forearm support while standing to wash hands at sink; able to complete clothing management after toileting with close guard for safety                            ADL either performed or assessed with clinical judgement   ADL Overall ADL's : Needs assistance/impaired     Grooming: Wash/dry hands;Min Dispensing optician: Min guard;Ambulation;BSC;RW Toilet Transfer Details (indicate cue type and reason): BSC over toilet  Toileting- Clothing Manipulation and Hygiene: Min guard;Sit to/from stand Toileting - Clothing Manipulation Details (indicate cue type and reason): increased time to complete; Pt completes perihygiene and clothing management with MinGuard      Functional mobility during ADLs: Min guard;Rolling walker                         Cognition Arousal/Alertness: Awake/alert Behavior During Therapy: WFL for tasks assessed/performed Overall Cognitive Status: Within Functional Limits for tasks assessed                                                            Pertinent Vitals/ Pain  Pain Assessment: Faces Pain Score: 6  Faces Pain Scale: Hurts little more Pain Location: R knee  Pain Descriptors / Indicators: Aching;Operative site guarding Pain Intervention(s): Monitored during session;Repositioned                                                          Frequency  Min 2X/week        Progress Toward Goals  OT Goals(current goals can now be found in the care plan section)  Progress towards OT goals: Progressing toward goals  Acute Rehab OT Goals Patient Stated Goal: to decrease pain  OT Goal Formulation: With patient Time For Goal Achievement: 08/02/17 Potential to Achieve Goals: Good  Plan  Discharge plan remains appropriate                    AM-PAC PT "6 Clicks" Daily Activity     Outcome Measure   Help from another person eating meals?: None Help from another person taking care of personal grooming?: None Help from another person toileting, which includes using toliet, bedpan, or urinal?: A Little Help from another person bathing (including washing, rinsing, drying)?: A Lot Help from another person to put on and taking off regular upper body clothing?: None Help from another person to put on and taking off regular lower body clothing?: A Lot 6 Click Score: 19    End of Session Equipment Utilized During Treatment: Gait belt;Rolling walker;Right knee immobilizer  OT Visit Diagnosis: Unsteadiness on feet (R26.81);Other abnormalities of gait and mobility (R26.89);Muscle weakness (generalized) (M62.81);Pain Pain - Right/Left: Right Pain - part of body: Knee   Activity Tolerance Patient tolerated treatment well   Patient Left Other (comment) (seated EOB, PT arriving to begin session )   Nurse Communication Mobility status        Time: 8242-3536 OT Time Calculation (min): 33 min  Charges: OT General Charges $OT Visit: 1 Visit OT Treatments $Self Care/Home Management : 23-37 mins  Shelby Reese, OT Pager [phone] 07/22/2017    Shelby Reese 07/22/2017, 11:38 AM

## 2017-07-22 NOTE — Progress Notes (Signed)
Physical Therapy Treatment Patient Details Name: Shelby Reese MRN: [id number] DOB: 1934-12-30 Today's Date: 07/22/2017    History of Present Illness Pt is a 82 y/o female admitted secondary to R knee necrotic wound and is s/p I and D with wound vac placement. PMH includes CKD, HTN, fibromyalgia, PVD, and L menisectomy.     PT Comments    Pt performed increased activity during gait training.  Pt remains flat in her personality and very much set in her ways of mobility.  PTA remains to provided cues for safety with hand and foot placement during transfers.  Pt tolerated gait but remains at slow gait speed which increases her risk for a fall.  Plan for surgery in am.     Follow Up Recommendations  DC plan and follow up therapy as arranged by surgeon;Supervision/Assistance - 24 hour     Equipment Recommendations  3in1 (PT)    Recommendations for Other Services OT consult     Precautions / Restrictions Precautions Precautions: Fall Required Braces or Orthoses: Knee Immobilizer - Right Knee Immobilizer - Right: On at all times Restrictions Weight Bearing Restrictions: Yes RLE Weight Bearing: Weight bearing as tolerated    Mobility  Bed Mobility Overal bed mobility: Needs Assistance Bed Mobility: Sit to Supine       Sit to supine: Min assist   General bed mobility comments: Pt attempted to lift R LE back into bed and unable to despite heavy use of rail.  Pt required min assist to advance RLE back into bed.    Transfers Overall transfer level: Needs assistance Equipment used: Rolling walker (2 wheeled) (bariatric RW) Transfers: Sit to/from Stand Sit to Stand: Supervision         General transfer comment: Cues for hand placement, Pt pushed down through RW to ascend into standing.  Pt remains to present with poor eccentric loading despite cues for foot and hand placement to improve descent to seated surface.    Ambulation/Gait Ambulation/Gait assistance: Min  guard Ambulation Distance (Feet): 200 Feet Assistive device: Rolling walker (2 wheeled) (bariatric) Gait Pattern/deviations: Step-through pattern;Decreased stride length;Shuffle;Trunk flexed Gait velocity: Decreased   General Gait Details: Pt required cues for increasing step length on L.  Pt with frequent rest breaks due to numbness in B hands.  Extremely slow and guarded gait.     Stairs            Wheelchair Mobility    Modified Rankin (Stroke Patients Only)       Balance     Sitting balance-Leahy Scale: Good       Standing balance-Leahy Scale: Fair                              Cognition Arousal/Alertness: Awake/alert Behavior During Therapy: WFL for tasks assessed/performed Overall Cognitive Status: Within Functional Limits for tasks assessed                                        Exercises      General Comments        Pertinent Vitals/Pain Pain Assessment: 0-10 Pain Score: 6  Pain Location: R knee  Pain Descriptors / Indicators: Aching;Operative site guarding Pain Intervention(s): Monitored during session;Repositioned (Pt reports she is getting pain medicine after PT.  )    Home Living  Prior Function            PT Goals (current goals can now be found in the care plan section) Acute Rehab PT Goals Patient Stated Goal: to decrease pain  Potential to Achieve Goals: Good Progress towards PT goals: Progressing toward goals    Frequency    Min 5X/week      PT Plan Current plan remains appropriate    Co-evaluation              AM-PAC PT "6 Clicks" Daily Activity  Outcome Measure  Difficulty turning over in bed (including adjusting bedclothes, sheets and blankets)?: A Little Difficulty moving from lying on back to sitting on the side of the bed? : Unable Difficulty sitting down on and standing up from a chair with arms (e.g., wheelchair, bedside commode, etc,.)?: A  Little Help needed moving to and from a bed to chair (including a wheelchair)?: A Little Help needed walking in hospital room?: A Little Help needed climbing 3-5 steps with a railing? : A Lot 6 Click Score: 15    End of Session Equipment Utilized During Treatment: Gait belt;Right knee immobilizer Activity Tolerance: Patient limited by pain Patient left: with call bell/phone within reach;in bed Nurse Communication: Mobility status;Patient requests pain meds PT Visit Diagnosis: Unsteadiness on feet (R26.81);Other abnormalities of gait and mobility (R26.89);Muscle weakness (generalized) (M62.81);Pain Pain - Right/Left: Right     Time: 9357-0177 PT Time Calculation (min) (ACUTE ONLY): 30 min  Charges:  $Gait Training: 8-22 mins $Therapeutic Activity: 8-22 mins                    G Codes:       Governor Rooks, PTA pager [place] 07/22/2017, 10:58 AM

## 2017-07-22 NOTE — Progress Notes (Signed)
Patient ID: Shelby Reese, female   DOB: Oct 24, 1935, 82 y.o.   MRN: [id number] Patient comfortable without complaints. Instillation wound VAC functioning well. Plan for return to the operating room tomorrow Wednesday for repeat debridement and reapplication of instillation wound VAC.

## 2017-07-22 NOTE — Progress Notes (Signed)
CRITICAL VALUE ALERT  Critical Value:  vanc tr 21  Date & Time Notied:  07/21/17 2040  Provider Notified: pharmacy  Orders Received/Actions taken: yes

## 2017-07-23 ENCOUNTER — Encounter (HOSPITAL_COMMUNITY)
Admission: EM | Disposition: A | Discharge status: Home Health | Payer: Self-pay | Source: Home / Self Care | Attending: Family Medicine

## 2017-07-23 ENCOUNTER — Inpatient Hospital Stay (HOSPITAL_COMMUNITY): Payer: Medicare Other | Admitting: Anesthesiology

## 2017-07-23 ENCOUNTER — Ambulatory Visit: Payer: Medicare Other | Admitting: Podiatry

## 2017-07-23 ENCOUNTER — Encounter (HOSPITAL_COMMUNITY): Payer: Self-pay | Admitting: *Deleted

## 2017-07-23 ENCOUNTER — Inpatient Hospital Stay (HOSPITAL_COMMUNITY): Admission: RE | Admit: 2017-07-23 | Payer: Medicare Other | Source: Ambulatory Visit | Admitting: Orthopedic Surgery

## 2017-07-23 HISTORY — PX: I & D EXTREMITY: SHX5045

## 2017-07-23 LAB — BASIC METABOLIC PANEL
ANION GAP: 7 (ref 5–15)
BUN: 18 mg/dL (ref 6–20)
CHLORIDE: 105 mmol/L (ref 101–111)
CO2: 23 mmol/L (ref 22–32)
Calcium: 8.3 mg/dL — ABNORMAL LOW (ref 8.9–10.3)
Creatinine, Ser: 0.94 mg/dL (ref 0.44–1.00)
GFR calc Af Amer: >60 mL/min (ref >60)
GFR calc non Af Amer: 56 mL/min — ABNORMAL LOW (ref >60)
Glucose, Bld: 95 mg/dL (ref 65–99)
Potassium: 3.9 mmol/L (ref 3.5–5.1)
SODIUM: 135 mmol/L (ref 135–145)

## 2017-07-23 LAB — CULTURE, BLOOD (ROUTINE X 2)
Culture: NO GROWTH
Culture: NO GROWTH
Special Requests: ADEQUATE
Special Requests: ADEQUATE

## 2017-07-23 SURGERY — IRRIGATION AND DEBRIDEMENT EXTREMITY
Anesthesia: General | Laterality: Right

## 2017-07-23 MED ORDER — POLYETHYLENE GLYCOL 3350 17 G PO PACK: 17.0000 g | PACK | Freq: Every day | ORAL | Status: DC | PRN

## 2017-07-23 MED ORDER — DOCUSATE SODIUM 100 MG PO CAPS
100.0000 mg | ORAL_CAPSULE | Freq: Two times a day (BID) | ORAL | Status: DC
  Administered 2017-07-26 – 2017-07-29 (×6): 100 mg via ORAL
  Filled 2017-07-23 (×8): qty 1

## 2017-07-23 MED ORDER — SODIUM CHLORIDE 0.9 % IR SOLN
Status: DC | PRN
Start: 2017-07-23 — End: 2017-07-23
  Administered 2017-07-23: 3000 mL

## 2017-07-23 MED ORDER — METHOCARBAMOL 500 MG PO TABS
500.0000 mg | ORAL_TABLET | Freq: Four times a day (QID) | ORAL | Status: DC | PRN
  Administered 2017-07-24 – 2017-07-25 (×3): 500 mg via ORAL
  Filled 2017-07-23 (×3): qty 1

## 2017-07-23 MED ORDER — ACETAMINOPHEN 650 MG RE SUPP: 650.0000 mg | Freq: Four times a day (QID) | RECTAL | Status: DC | PRN

## 2017-07-23 MED ORDER — ACETAMINOPHEN 325 MG PO TABS: 650.0000 mg | ORAL_TABLET | Freq: Four times a day (QID) | ORAL | Status: DC | PRN

## 2017-07-23 MED ORDER — 0.9 % SODIUM CHLORIDE (POUR BTL) OPTIME
TOPICAL | Status: DC | PRN
  Administered 2017-07-23: 1000 mL

## 2017-07-23 MED ORDER — METOCLOPRAMIDE HCL 5 MG PO TABS: 5.0000 mg | ORAL_TABLET | Freq: Three times a day (TID) | ORAL | Status: DC | PRN

## 2017-07-23 MED ORDER — ONDANSETRON HCL 4 MG/2ML IJ SOLN: 4.0000 mg | Freq: Four times a day (QID) | INTRAMUSCULAR | Status: DC | PRN

## 2017-07-23 MED ORDER — SODIUM CHLORIDE 0.9 % IV SOLN
INTRAVENOUS | Status: DC
  Administered 2017-07-27: 23:00:00 via INTRAVENOUS

## 2017-07-23 MED ORDER — DEXAMETHASONE SODIUM PHOSPHATE 10 MG/ML IJ SOLN
INTRAMUSCULAR | Status: DC | PRN
  Administered 2017-07-23: 10 mg via INTRAVENOUS

## 2017-07-23 MED ORDER — MAGNESIUM CITRATE PO SOLN: 1.0000 | Freq: Once | ORAL | Status: DC | PRN

## 2017-07-23 MED ORDER — OXYCODONE HCL 5 MG PO TABS: 5.0000 mg | ORAL_TABLET | ORAL | Status: DC | PRN

## 2017-07-23 MED ORDER — METOCLOPRAMIDE HCL 5 MG/ML IJ SOLN: 5.0000 mg | Freq: Three times a day (TID) | INTRAMUSCULAR | Status: DC | PRN

## 2017-07-23 MED ORDER — LIDOCAINE 2% (20 MG/ML) 5 ML SYRINGE
INTRAMUSCULAR | Status: DC | PRN
  Administered 2017-07-23: 100 mg via INTRAVENOUS

## 2017-07-23 MED ORDER — DEXTROSE 5 % IV SOLN: 500.0000 mg | Freq: Four times a day (QID) | INTRAVENOUS | Status: DC | PRN

## 2017-07-23 MED ORDER — PHENYLEPHRINE 40 MCG/ML (10ML) SYRINGE FOR IV PUSH (FOR BLOOD PRESSURE SUPPORT)
PREFILLED_SYRINGE | INTRAVENOUS | Status: DC | PRN
  Administered 2017-07-23 (×2): 120 ug via INTRAVENOUS

## 2017-07-23 MED ORDER — HYDROMORPHONE HCL 1 MG/ML IJ SOLN
1.0000 mg | INTRAMUSCULAR | Status: DC | PRN
  Filled 2017-07-23: qty 1

## 2017-07-23 MED ORDER — MEPERIDINE HCL 25 MG/ML IJ SOLN: 6.2500 mg | INTRAMUSCULAR | Status: DC | PRN

## 2017-07-23 MED ORDER — ONDANSETRON HCL 4 MG PO TABS: 4.0000 mg | ORAL_TABLET | Freq: Four times a day (QID) | ORAL | Status: DC | PRN

## 2017-07-23 MED ORDER — FENTANYL CITRATE (PF) 100 MCG/2ML IJ SOLN
INTRAMUSCULAR | Status: AC
  Administered 2017-07-23: 50 ug via INTRAVENOUS
  Filled 2017-07-23: qty 2

## 2017-07-23 MED ORDER — PROPOFOL 10 MG/ML IV BOLUS
INTRAVENOUS | Status: DC | PRN
  Administered 2017-07-23: 100 mg via INTRAVENOUS

## 2017-07-23 MED ORDER — BISACODYL 10 MG RE SUPP: 10.0000 mg | Freq: Every day | RECTAL | Status: DC | PRN

## 2017-07-23 MED ORDER — LACTATED RINGERS IV SOLN
INTRAVENOUS | Status: DC
  Administered 2017-07-23: 11:00:00 via INTRAVENOUS

## 2017-07-23 MED ORDER — FENTANYL CITRATE (PF) 250 MCG/5ML IJ SOLN
INTRAMUSCULAR | Status: DC | PRN
  Administered 2017-07-23 (×2): 50 ug via INTRAVENOUS

## 2017-07-23 MED ORDER — MIDAZOLAM HCL 2 MG/2ML IJ SOLN
INTRAMUSCULAR | Status: DC | PRN
  Administered 2017-07-23: 2 mg via INTRAVENOUS

## 2017-07-23 MED ORDER — ONDANSETRON HCL 4 MG/2ML IJ SOLN
INTRAMUSCULAR | Status: DC | PRN
  Administered 2017-07-23: 4 mg via INTRAVENOUS

## 2017-07-23 MED ORDER — FENTANYL CITRATE (PF) 100 MCG/2ML IJ SOLN
25.0000 ug | INTRAMUSCULAR | Status: DC | PRN
  Administered 2017-07-23: 50 ug via INTRAVENOUS

## 2017-07-23 SURGICAL SUPPLY — 36 items
APL SKNCLS STERI-STRIP NONHPOA (GAUZE/BANDAGES/DRESSINGS) ×4
BENZOIN TINCTURE PRP APPL 2/3 (GAUZE/BANDAGES/DRESSINGS) ×8 IMPLANT
BLADE SURG 21 STRL SS (BLADE) ×3 IMPLANT
BNDG COHESIVE 6X5 TAN STRL LF (GAUZE/BANDAGES/DRESSINGS) IMPLANT
BNDG GAUZE ELAST 4 BULKY (GAUZE/BANDAGES/DRESSINGS) ×6 IMPLANT
COVER SURGICAL LIGHT HANDLE (MISCELLANEOUS) ×6 IMPLANT
DRAPE U-SHAPE 47X51 STRL (DRAPES) ×3 IMPLANT
DRESSING HYDROCOLLOID 4X4 XTH (GAUZE/BANDAGES/DRESSINGS) ×4 IMPLANT
DRESSING VERAFLO CLEANSE CC (GAUZE/BANDAGES/DRESSINGS) IMPLANT
DRSG ADAPTIC 3X8 NADH LF (GAUZE/BANDAGES/DRESSINGS) ×3 IMPLANT
DRSG VERAFLO CLEANSE CC (GAUZE/BANDAGES/DRESSINGS) ×3
DURAPREP 26ML APPLICATOR (WOUND CARE) ×3 IMPLANT
ELECT REM PT RETURN 9FT ADLT (ELECTROSURGICAL)
ELECTRODE REM PT RTRN 9FT ADLT (ELECTROSURGICAL) IMPLANT
GAUZE SPONGE 4X4 12PLY STRL (GAUZE/BANDAGES/DRESSINGS) ×3 IMPLANT
GLOVE BIOGEL PI IND STRL 9 (GLOVE) ×1 IMPLANT
GLOVE BIOGEL PI INDICATOR 9 (GLOVE) ×2
GLOVE SURG ORTHO 9.0 STRL STRW (GLOVE) ×3 IMPLANT
GOWN STRL REUS W/ TWL XL LVL3 (GOWN DISPOSABLE) ×2 IMPLANT
GOWN STRL REUS W/TWL XL LVL3 (GOWN DISPOSABLE) ×6
HANDPIECE INTERPULSE COAX TIP (DISPOSABLE)
KIT BASIN OR (CUSTOM PROCEDURE TRAY) ×3 IMPLANT
KIT ROOM TURNOVER OR (KITS) ×3 IMPLANT
MANIFOLD NEPTUNE II (INSTRUMENTS) ×3 IMPLANT
NS IRRIG 1000ML POUR BTL (IV SOLUTION) ×3 IMPLANT
PACK ORTHO EXTREMITY (CUSTOM PROCEDURE TRAY) ×3 IMPLANT
PAD ARMBOARD 7.5X6 YLW CONV (MISCELLANEOUS) ×6 IMPLANT
SET HNDPC FAN SPRY TIP SCT (DISPOSABLE) IMPLANT
STOCKINETTE IMPERVIOUS 9X36 MD (GAUZE/BANDAGES/DRESSINGS) IMPLANT
SUT ETHILON 2 0 PSLX (SUTURE) ×4 IMPLANT
SWAB COLLECTION DEVICE MRSA (MISCELLANEOUS) ×3 IMPLANT
SWAB CULTURE ESWAB REG 1ML (MISCELLANEOUS) IMPLANT
TOWEL OR 17X26 10 PK STRL BLUE (TOWEL DISPOSABLE) ×3 IMPLANT
TUBE CONNECTING 12'X1/4 (SUCTIONS) ×1
TUBE CONNECTING 12X1/4 (SUCTIONS) ×2 IMPLANT
YANKAUER SUCT BULB TIP NO VENT (SUCTIONS) ×3 IMPLANT

## 2017-07-23 NOTE — Anesthesia Postprocedure Evaluation (Signed)
Anesthesia Post Note  Patient: STEPHANIEMARIE STOFFEL  Procedure(s) Performed: Procedure(s) (LRB): REPEAT IRRIGATION AND DEBRIDEMENT RIGHT KNEE (Right)     Patient location during evaluation: PACU Anesthesia Type: General Level of consciousness: awake and alert Pain management: pain level controlled Vital Signs Assessment: post-procedure vital signs reviewed and stable Respiratory status: spontaneous breathing, nonlabored ventilation, respiratory function stable and patient connected to nasal cannula oxygen Cardiovascular status: blood pressure returned to baseline and stable Postop Assessment: no signs of nausea or vomiting Anesthetic complications: no    Last Vitals:  Vitals:   07/23/17 1345 07/23/17 1351  BP: (!) 167/61 (!) 143/62  Pulse: 78 82  Resp: 11 13  Temp: 37 C   SpO2: 99% 96%    Last Pain:  Vitals:   07/23/17 1325  TempSrc:   PainSc: Asleep                 Yareli Carthen

## 2017-07-23 NOTE — Anesthesia Preprocedure Evaluation (Addendum)
Anesthesia Evaluation  Patient identified by MRN, date of birth, ID band Patient awake    Reviewed: Allergy & Precautions, NPO status , Patient's Chart, lab work & pertinent test results  Airway Mallampati: II  TM Distance: >3 FB Neck ROM: Full    Dental no notable dental hx.    Pulmonary former smoker,    breath sounds clear to auscultation       Cardiovascular hypertension, + Peripheral Vascular Disease   Rhythm:Regular Rate:Normal     Neuro/Psych  Neuromuscular disease    GI/Hepatic hiatal hernia, GERD  ,  Endo/Other  Hypothyroidism   Renal/GU Renal disease     Musculoskeletal  (+) Arthritis , Fibromyalgia -  Abdominal (+) + obese,   Peds  Hematology  (+) anemia ,   Anesthesia Other Findings   Reproductive/Obstetrics                                                             Anesthesia Evaluation  Patient identified by MRN, date of birth, ID band Patient awake    Reviewed: Allergy & Precautions, NPO status , Patient's Chart, lab work & pertinent test results  Airway Mallampati: II  TM Distance: >3 FB Neck ROM: Full    Dental no notable dental hx.    Pulmonary former smoker,    breath sounds clear to auscultation       Cardiovascular hypertension, + Peripheral Vascular Disease   Rhythm:Regular Rate:Normal     Neuro/Psych  Neuromuscular disease    GI/Hepatic hiatal hernia, GERD  ,  Endo/Other    Renal/GU Renal disease     Musculoskeletal  (+) Arthritis , Fibromyalgia -  Abdominal (+) + obese,   Peds  Hematology  (+) anemia ,   Anesthesia Other Findings   Reproductive/Obstetrics                            Anesthesia Physical Anesthesia Plan  ASA: III  Anesthesia Plan: General   Post-op Pain Management:    Induction: Intravenous  PONV Risk Score and Plan: 3 and Ondansetron, Dexamethasone, Midazolam and Propofol  infusion  Airway Management Planned: LMA  Additional Equipment:   Intra-op Plan:   Post-operative Plan: Extubation in OR  Informed Consent: I have reviewed the patients History and Physical, chart, labs and discussed the procedure including the risks, benefits and alternatives for the proposed anesthesia with the patient or authorized representative who has indicated his/her understanding and acceptance.   Dental advisory given  Plan Discussed with:   Anesthesia Plan Comments:         Anesthesia Quick Evaluation  Anesthesia Physical  Anesthesia Plan  ASA: III  Anesthesia Plan: General   Post-op Pain Management:    Induction: Intravenous  PONV Risk Score and Plan: 3 and Ondansetron, Dexamethasone, Midazolam, Propofol infusion and Treatment may vary due to age or medical condition  Airway Management Planned: LMA  Additional Equipment:   Intra-op Plan:   Post-operative Plan: Extubation in OR  Informed Consent: I have reviewed the patients History and Physical, chart, labs and discussed the procedure including the risks, benefits and alternatives for the proposed anesthesia with the patient or authorized representative who has indicated his/her understanding and acceptance.   Dental advisory given  Plan  Discussed with:   Anesthesia Plan Comments:        Anesthesia Quick Evaluation

## 2017-07-23 NOTE — Progress Notes (Signed)
PROGRESS NOTE    Shelby Reese  XVQ:[id number] DOB: 03/09/1935 DOA: 07/17/2017 PCP: [name], MD   Subjective: Denies any complaints this morning, did not remember she had surgery last Friday. Denies any fever or chills. Continue current antibiotics  Brief Narrative:  82 year old female who presents to the leg pain. Patient is known to have hypertension, hypothyroidism and chronic kidney disease. About 2 months prior to hospitalization she developed a right knee traumatic hematoma, spontaneously drain about 3 weeks ago and recently complicated with erythema, pain and worsening edema. She was seen by orthopedics as an outpatient, she was started on antibiotics, with recommendations for surgical debridement. On the initial physical examination blood pressure 165/58, heart rate 97, temperature 98.5,respiratory rate 16, oxygen saturation 98%. Moist mucous membranes, lungs clear to auscultation, heart S1-S2 present and rhythmic, trace lower extremity edema. Right knee with a large ulcerated wound, throughout the right knee, purulent drainage, unstageable. Sodium 134, potassium 4.1, chloride 102, bicarbonate 21, glucose 139, BUN 34, creatinine 1.49, white count 11.4, hemoglobin 9.5, hematocrit 30.9, platelets 373. Urinalysis negative for infection.  Patient admitted to the hospital working diagnosis of large right knee infected ulcerated wound.  Patient intervened by orthopedics on 08/30, planned subsequent surgeries on 09/5 and 09/7.    Assessment & Plan:   Principal Problem:   Cellulitis Active Problems:   Hypothyroidism, acquired   Anemia, iron deficiency   Essential hypertension   Wound, open, knee, lower leg, or ankle with complication, right, initial encounter   Large right knee infected ulcerated wound.  Patient hadirrigation and debridement 08/31, continue antibiotic therapy with IV ceftriaxone, and vancomycin #4.  Blood cultures have remained with no growth. Continue pain  control. Wbc 9,9. Patient afebrile.   Hypertension Blood pressure 130 to 150, continue diltiazem and as needed hydralazine. Continue to hold on ace inh due to hyperkalemia.  Acute post operative anemia on chronic anemia with iron deficiency SP one unit prbc transfused. Check CBC in a.m.  Hypothyroidism Continue withlevothyroxine, per home regimen.   CKD stage 3 Patient tolerating po well, off IV fluids, will follow on renal panel in am, avoid hypotension or nephrotoxic medications. Follow vancomycin level per pharmacy protocol, vanc level 21.   DVT prophylaxis: enoxaparin Code Status:full Family Communication:no family at bedside  Disposition Plan:TBA, PT to evaluate after surgery likely SNF   Consultants:  Orthopedics  Procedures:  I&D right knee 08/31  Antimicrobials:   Ceftriaxone  Vancomycin     Objective: Vitals:   07/21/17 2102 07/22/17 0446 07/22/17 1832 07/23/17 1117  BP: 134/62 (!) 150/66    Pulse: 90 87 89   Resp: 17 20    Temp: 99.2 F (37.3 C) 98.2 F (36.8 C) 99.9 F (37.7 C)   TempSrc: Oral Oral Oral   SpO2: 96% 97% 98%   Weight:    106.6 kg (235 lb)  Height:    5\' 5"  (1.651 m)    Intake/Output Summary (Last 24 hours) at 07/23/17 1143 Last data filed at 07/23/17 0300  Gross per 24 hour  Intake               22 ml  Output               51 ml  Net              -29 ml   Filed Weights   07/17/17 2046 07/18/17 1016 07/23/17 1117  Weight: 106.6 kg (235 lb) 106.6 kg (235 lb) 106.6 kg (235  lb)    Examination:  General: deconditioned Neurology: Awake and alert, non focal  E ENT: mild pallor, no icterus, oral mucosa moist Cardiovascular: S1-S2 present, rhythmic, no gallops, rubs, or murmurs. No jugular venous distention, no lower extremity edema. Pulmonary: vesicular breath sounds bilaterally, adequate air movement, no wheezing, rhonchi or rales. Gastrointestinal. Abdomen flat, no organomegaly, non tender, no rebound or  guarding Skin. No rashes Musculoskeletal: right lower extremity with brace in place.      Data Reviewed: I have personally reviewed following labs and imaging studies  CBC:  Recent Labs Lab 07/17/17 1827 07/18/17 0403 07/19/17 0339 07/20/17 0423 07/21/17 0252  WBC 11.4* 10.0 12.3* 10.7* 9.9  NEUTROABS 8.6*  --  10.6* 7.2 6.4  HGB 9.5* 8.8* 7.9* 7.0* 8.6*  HCT 30.9* 29.5* 26.2* 23.7* 29.0*  MCV 87.5 88.1 87.6 89.1 89.5  PLT 373 332 331 326 371   Basic Metabolic Panel:  Recent Labs Lab 07/18/17 0403 07/19/17 0339 07/20/17 0423 07/21/17 0252 07/23/17 0307  NA 137 135 136 136 135  K 4.3 5.2* 4.7 4.4 3.9  CL 107 107 107 107 105  CO2 20* 22 22 21* 23  GLUCOSE 107* 134* 136* 97 95  BUN 26* 22* 31* 30* 18  CREATININE 1.20* 1.20* 1.38* 1.44* 0.94  CALCIUM 9.6 8.8* 8.4* 8.4* 8.3*   GFR: Estimated Creatinine Clearance: 58.8 mL/min (by C-G formula based on SCr of 0.94 mg/dL). Liver Function Tests:  Recent Labs Lab 07/17/17 1827  AST 19  ALT 14  ALKPHOS 56  BILITOT 0.4  PROT 7.7  ALBUMIN 3.7   No results for input(s): LIPASE, AMYLASE in the last 168 hours. No results for input(s): AMMONIA in the last 168 hours. Coagulation Profile: No results for input(s): INR, PROTIME in the last 168 hours. Cardiac Enzymes: No results for input(s): CKTOTAL, CKMB, CKMBINDEX, TROPONINI in the last 168 hours. BNP (last 3 results) No results for input(s): PROBNP in the last 8760 hours. HbA1C: No results for input(s): HGBA1C in the last 72 hours. CBG: No results for input(s): GLUCAP in the last 168 hours. Lipid Profile: No results for input(s): CHOL, HDL, LDLCALC, TRIG, CHOLHDL, LDLDIRECT in the last 72 hours. Thyroid Function Tests: No results for input(s): TSH, T4TOTAL, FREET4, T3FREE, THYROIDAB in the last 72 hours. Anemia Panel: No results for input(s): VITAMINB12, FOLATE, FERRITIN, TIBC, IRON, RETICCTPCT in the last 72 hours.    Radiology Studies: I have reviewed all  of the imaging during this hospital visit personally     Scheduled Meds: . [MAR Hold] aspirin EC  81 mg Oral Q breakfast  . [MAR Hold] diltiazem  120 mg Oral Q breakfast  . [MAR Hold] docusate sodium  100 mg Oral BID  . [MAR Hold] enoxaparin (LOVENOX) injection  40 mg Subcutaneous Q24H  . [MAR Hold] famotidine  20 mg Oral Q1500  . [MAR Hold] ferrous sulfate  325 mg Oral QHS  . [MAR Hold] levothyroxine  175 mcg Oral QAC breakfast  . [MAR Hold] magnesium oxide  200 mg Oral Q1500   Continuous Infusions: . sodium chloride 10 mL/hr at 07/18/17 1303  . [MAR Hold] cefTRIAXone (ROCEPHIN)  IV 2 g (07/23/17 0725)  . lactated ringers 10 mL/hr at 07/23/17 1120  . [MAR Hold] methocarbamol (ROBAXIN)  IV    . [MAR Hold] vancomycin Stopped (07/22/17 2343)     LOS: 6 days     Birdie Hopes, MD [facility] Pager [phone]

## 2017-07-23 NOTE — Op Note (Signed)
07/17/2017 - 07/23/2017  12:47 PM  PATIENT:  Shelby Reese    PRE-OPERATIVE DIAGNOSIS:  Right Knee Ulcer  POST-OPERATIVE DIAGNOSIS:  Same  PROCEDURE:  REPEAT IRRIGATION AND DEBRIDEMENT RIGHT KNEE Excision of skin and soft tissue muscle and fascia with 21 blade knife, Roger, and Cobb elevator Application of instillation wound VAC  SURGEON:  [name], MD  PHYSICIAN ASSISTANT:None ANESTHESIA:   General  PREOPERATIVE INDICATIONS:  Shelby Reese is a  82 y.o. female with a diagnosis of Right Knee Ulcer who failed conservative measures and elected for surgical management.    The risks benefits and alternatives were discussed with the patient preoperatively including but not limited to the risks of infection, bleeding, nerve injury, cardiopulmonary complications, the need for revision surgery, among others, and the patient was willing to proceed.  OPERATIVE IMPLANTS: Instillation wound VAC  OPERATIVE FINDINGS: Much improved granulation tissue however there was some mild ischemic changes surrounding the wound edges.  OPERATIVE PROCEDURE: Patient was brought to the operating room and underwent a general anesthetic. After adequate levels of anesthesia obtained patient's right lower extremity was prepped using Betadine paint and draped into a sterile field a timeout was called. Patient had much improved ventilation tissue in the wound bed. There was some mild necrotic changes around the wound edges and approximately 1 cm of the skin around the wound was excised this left a wound that was 10 x 18 cm. Electrocautery was used for hemostasis with the varicose veins. The wound was further debrided with a Roger 21 blade knife and Cobb elevator. The wound was irrigated with pulsatile lavage 3 L. There is good bleeding. The cleanse choice instillation foam was used with a reticulated and thick foam on top. The edges of the wound were prepped using Benzoyne and the wound was surrounded with DuoDERM and  sterile drapes were applied this had a good suction fit patient was resumed on her instillation therapy extubated and taken to the PACU in stable condition.

## 2017-07-23 NOTE — Interval H&P Note (Signed)
History and Physical Interval Note:  07/23/2017 6:26 AM  Shelby Reese  has presented today for surgery, with the diagnosis of Right Knee Ulcer  The various methods of treatment have been discussed with the patient and family. After consideration of risks, benefits and other options for treatment, the patient has consented to  Procedure(s): REPEAT IRRIGATION AND DEBRIDEMENT RIGHT KNEE (Right) as a surgical intervention .  The patient's history has been reviewed, patient examined, no change in status, stable for surgery.  I have reviewed the patient's chart and labs.  Questions were answered to the patient's satisfaction.     [name]

## 2017-07-23 NOTE — H&P (View-Only) (Signed)
Patient ID: Shelby Reese, female   DOB: 1934-11-25, 82 y.o.   MRN: [id number] Patient comfortable without complaints. Instillation wound VAC functioning well. Plan for return to the operating room tomorrow Wednesday for repeat debridement and reapplication of instillation wound VAC.

## 2017-07-23 NOTE — Transfer of Care (Signed)
Immediate Anesthesia Transfer of Care Note  Patient: Shelby Reese  Procedure(s) Performed: Procedure(s): REPEAT IRRIGATION AND DEBRIDEMENT RIGHT KNEE (Right)  Patient Location: PACU  Anesthesia Type:General  Level of Consciousness: awake, alert , oriented and patient cooperative  Airway & Oxygen Therapy: Patient Spontanous Breathing and Patient connected to nasal cannula oxygen  Post-op Assessment: Report given to RN and Post -op Vital signs reviewed and stable  Post vital signs: Reviewed and stable  Last Vitals:  Vitals:   07/22/17 0446 07/22/17 1832  BP: (!) 150/66   Pulse: 87 89  Resp: 20   Temp: 36.8 C 37.7 C  SpO2: 97% 98%    Last Pain:  Vitals:   07/23/17 0725  TempSrc:   PainSc: 6       Patients Stated Pain Goal: 3 (33/61/22 4497)  Complications: No apparent anesthesia complications

## 2017-07-23 NOTE — Progress Notes (Addendum)
PT Cancellation Note  Patient Details Name: Shelby Reese MRN: [id number] DOB: Jun 16, 1935   Cancelled Treatment:    Reason Eval/Treat Not Completed: (P) Patient at procedure or test/unavailable (Pt off unit for surgery, will defer tx today.  )   Jaymarie Yeakel Eli Hose 07/23/2017, 1:11 PM  Governor Rooks, PTA pager [phone]

## 2017-07-23 NOTE — Anesthesia Procedure Notes (Signed)
Procedure Name: LMA Insertion Date/Time: 07/23/2017 12:10 PM Performed by: Mervyn Gay Pre-anesthesia Checklist: Patient identified, Patient being monitored, Timeout performed, Emergency Drugs available and Suction available Patient Re-evaluated:Patient Re-evaluated prior to induction Oxygen Delivery Method: Circle System Utilized Preoxygenation: Pre-oxygenation with 100% oxygen Induction Type: IV induction Ventilation: Mask ventilation without difficulty LMA: LMA inserted LMA Size: 4.0 Number of attempts: 1 Placement Confirmation: positive ETCO2 and breath sounds checked- equal and bilateral Tube secured with: Tape Dental Injury: Teeth and Oropharynx as per pre-operative assessment

## 2017-07-24 ENCOUNTER — Encounter (HOSPITAL_COMMUNITY): Payer: Self-pay | Admitting: Orthopedic Surgery

## 2017-07-24 ENCOUNTER — Other Ambulatory Visit (INDEPENDENT_AMBULATORY_CARE_PROVIDER_SITE_OTHER): Payer: Self-pay | Admitting: Family

## 2017-07-24 LAB — CREATININE, SERUM
Creatinine, Ser: 1.03 mg/dL — ABNORMAL HIGH (ref 0.44–1.00)
GFR calc Af Amer: 58 mL/min — ABNORMAL LOW (ref >60)
GFR, EST NON AFRICAN AMERICAN: 50 mL/min — AB (ref >60)

## 2017-07-24 MED ORDER — CLINDAMYCIN PHOSPHATE 900 MG/50ML IV SOLN: 900.0000 mg | INTRAVENOUS | Status: DC

## 2017-07-24 MED ORDER — CHLORHEXIDINE GLUCONATE 4 % EX LIQD
60.0000 mL | Freq: Once | CUTANEOUS | Status: AC
  Administered 2017-07-25: 4 via TOPICAL
  Filled 2017-07-24: qty 60

## 2017-07-24 NOTE — Progress Notes (Signed)
Physical Therapy Treatment Patient Details Name: Shelby Reese MRN: [id number] DOB: 14-Jan-1935 Today's Date: 07/24/2017    History of Present Illness Pt is a 82 y/o female admitted secondary to R knee necrotic wound and is s/p I and D 8/31 and 9/5 with wound vac placement. PMH includes CKD, HTN, fibromyalgia, PVD, and L menisectomy.     PT Comments    Pt pleasant, particular and somewhat resistant to cues and change. Pt with very old bari RW in room that pt insists on using but is not a functional representation of home ability. Pt used old RW for walking half distance to gym then consented to transition to new bari RW for use for return to room. Pt required assist to get OOB, don KI and use of rails for stairs. Pt reports spouse will not assist at home and she may required private pay sitter, aide and therapy for home as she denies ST-SNF being an option. Pt required increased time for all mobility. Pt with OT end of session.    Follow Up Recommendations  DC plan and follow up therapy as arranged by surgeon;Supervision/Assistance - 24 hour     Equipment Recommendations  3in1 (PT);Rolling walker with 5" wheels (bari RW)    Recommendations for Other Services       Precautions / Restrictions Precautions Precautions: Fall Precaution Comments: VAC Required Braces or Orthoses: Knee Immobilizer - Right Knee Immobilizer - Right: On at all times Restrictions Weight Bearing Restrictions: Yes RLE Weight Bearing: Weight bearing as tolerated    Mobility  Bed Mobility Overal bed mobility: Needs Assistance Bed Mobility: Supine to Sit     Supine to sit: Min assist     General bed mobility comments: assist to move RLE to EOB, mod cues for hand placement , HOB 25degrees with use of rail   Transfers Overall transfer level: Needs assistance   Transfers: Sit to/from Stand Sit to Stand: Min guard         General transfer comment: cues for hand placement, pt wanting to maintain hands on  RW at all times, she did finally consent to performing with one hand on surface from bed and toillet  Ambulation/Gait Ambulation/Gait assistance: Min guard Ambulation Distance (Feet): 250 Feet Assistive device: Rolling walker (2 wheeled) Gait Pattern/deviations: Step-through pattern;Decreased stride length;Shuffle;Trunk flexed   Gait velocity interpretation: Below normal speed for age/gender General Gait Details: cues for posture, position in RW and safety   Stairs Stairs: Yes   Stair Management: Step to pattern;Forwards;Two rails Number of Stairs: 2 General stair comments: pt does not have rails at home and uses doorframe, furniture and car to hold onto and maneuver up and downstairs. Performed well with bil rails with cues for sequence. Educated for use of RW for first step backward as well as recommendation for installation of railings at home which pt denies wanting  Wheelchair Mobility    Modified Rankin (Stroke Patients Only)       Balance Overall balance assessment: Needs assistance   Sitting balance-Leahy Scale: Good       Standing balance-Leahy Scale: Fair                              Cognition Arousal/Alertness: Awake/alert Behavior During Therapy: WFL for tasks assessed/performed Overall Cognitive Status: Within Functional Limits for tasks assessed  General Comments: pt particular in attempting to perform tasks her way and somewhat resistant to cues and education      Exercises      General Comments        Pertinent Vitals/Pain Pain Score: 6  Pain Location: R knee  Pain Descriptors / Indicators: Aching;Operative site guarding Pain Intervention(s): Limited activity within patient's tolerance;Repositioned;Premedicated before session    Home Living                      Prior Function            PT Goals (current goals can now be found in the care plan section) Progress towards PT  goals: Progressing toward goals    Frequency    Min 3X/week      PT Plan Current plan remains appropriate;Frequency needs to be updated    Co-evaluation              AM-PAC PT "6 Clicks" Daily Activity  Outcome Measure  Difficulty turning over in bed (including adjusting bedclothes, sheets and blankets)?: A Little Difficulty moving from lying on back to sitting on the side of the bed? : Unable Difficulty sitting down on and standing up from a chair with arms (e.g., wheelchair, bedside commode, etc,.)?: A Little Help needed moving to and from a bed to chair (including a wheelchair)?: A Little Help needed walking in hospital room?: A Little Help needed climbing 3-5 steps with a railing? : A Little 6 Click Score: 16    End of Session Equipment Utilized During Treatment: Gait belt;Right knee immobilizer Activity Tolerance: Patient tolerated treatment well Patient left: Other (comment) (with OT in bathroom) Nurse Communication: Mobility status PT Visit Diagnosis: Difficulty in walking, not elsewhere classified (R26.2);Other abnormalities of gait and mobility (R26.89)     Time: 4193-7902 PT Time Calculation (min) (ACUTE ONLY): 68 min  Charges:  $Gait Training: 38-52 mins $Therapeutic Activity: 23-37 mins                    G Codes:       Elwyn Reach, PT [phone]   [place] 07/24/2017, 11:57 AM

## 2017-07-24 NOTE — Consult Note (Signed)
[facility] CM Primary Care Navigator  07/24/2017  Shelby Reese Apr 08, 1935 [id number]   Met with patient at the bedside to identify possible discharge needs. Patient reports having "increased swelling to right leg that resulted to a draining wound" which had led to this admission.  Patient endorses Dr. [name] with [facility] as her primary care provider.   Patient shared using  [facility] on [facility] to obtain medications without any problem.   Patient manages her medications at home straight out of the containers with her own organizing system (uses list).   Patient reports that husband Shelby Reese) provides transportation to her doctors' appointments.  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 She reports being able to take care for herself at home. Patient states that spouse may not be able assist her and she may require private pay sitter, aide and therapy for home as she denies skilled nursing facility (SNF) for short term rehabilitation being an option.  Anticipated discharge plan is home with home health services per patient since she had refused skilled nursing facility (SNF).  Patient states she is reluctant to hire someone from [employer] (where she use to work). She accepted [facility] list of personal care services provided for back-up resource when needed,with the understanding that she will pay out of the pocket for it.Patient wasgrateful forthe resource listprovided to her.  Patient voiced understanding to call primary care provider's office for a post discharge follow-up appointment within a week or sooner if needs arise. Patient letter (with PCP's contact number) was provided as a reminder.  Explained to [place] services available for health management but she denies any further needs or concerns at this time. Patientdeclined EMMI Calls for follow-up at home and  states she can be able to manage it herself.  She voiced understanding to seekreferral to [facility] care managementfrom primary care provider if deemed necessary and appropriatefor servicesin the future.   [facility] care management information provided for future needs that may arise.   For questions, please contact:  Dannielle Huh, BSN, RN- [facility] Primary Care Navigator  Telephone: [phone] [place]

## 2017-07-24 NOTE — Progress Notes (Addendum)
Occupational Therapy Treatment Patient Details Name: Shelby Reese MRN: [id number] DOB: 04/15/1935 Today's Date: 07/24/2017    History of present illness Pt is a 82 y/o female admitted secondary to R knee necrotic wound and is s/p I and D 8/31 and 9/5 with wound vac placement. PMH includes CKD, HTN, fibromyalgia, PVD, and L menisectomy.    OT comments  Pt progressing towards goals, completing UB/LB bathing with MinA, UB/LB dressing with MaxA for LB dressing. Pt completes functional mobility at RW with MinGuard assist throughout session. Requires verbal cues for attending to task at hand as Pt tends become easily distracted, requires increased time to complete all tasks. Feel Pt will benefit from continued acute and post acute OT services to progress Pt's safety and independence with ADLs and functional mobility upon discharge home.    Follow Up Recommendations  Home health OT;Supervision/Assistance - 24 hour    Equipment Recommendations  None recommended by OT          Precautions / Restrictions Precautions Precautions: Fall Precaution Comments: VAC Required Braces or Orthoses: Knee Immobilizer - Right Knee Immobilizer - Right: On at all times Restrictions Weight Bearing Restrictions: Yes RLE Weight Bearing: Weight bearing as tolerated       Mobility Bed Mobility Overal bed mobility: Needs Assistance Bed Mobility: Supine to Sit     Supine to sit: Min assist Sit to supine: Min assist   General bed mobility comments: assist for advancing RLE onto bed  Transfers Overall transfer level: Needs assistance Equipment used: Rolling walker (2 wheeled) Transfers: Sit to/from Stand Sit to Stand: Min guard         General transfer comment: verbal cues for hand placement, Pt initially hesitant to comply but does so before completing    Balance Overall balance assessment: Needs assistance Sitting-balance support: No upper extremity supported;Feet supported Sitting  balance-Leahy Scale: Good       Standing balance-Leahy Scale: Fair                             ADL either performed or assessed with clinical judgement   ADL Overall ADL's : Needs assistance/impaired         Upper Body Bathing: Set up;Sitting;Min guard   Lower Body Bathing: Minimal assistance;Sit to/from stand   Upper Body Dressing : Minimal assistance;Set up;Sitting Upper Body Dressing Details (indicate cue type and reason): assist to don gown over IV  Lower Body Dressing: Maximal assistance;Sit to/from stand Lower Body Dressing Details (indicate cue type and reason): assist to thread underwear over LEs, Pt able to advance over hips while standing at RW with close guard for safety  Toilet Transfer: Min guard;Ambulation;BSC;RW           Functional mobility during ADLs: Min guard;Rolling walker General ADL Comments: Pt completed UB/LB bathing and UB/LB dressing seated on BSC at sink with MinGuard, MinA for standing portions of task; Pt able to maintain standing balance approx 5-10 min while washing buttocks, perineal region and L upper leg. Increased assist for LB dressing.                         Cognition Arousal/Alertness: Awake/alert Behavior During Therapy: WFL for tasks assessed/performed Overall Cognitive Status: Within Functional Limits for tasks assessed  General Comments: pt particular in attempting to perform tasks her way and somewhat resistant to cues and education; requires verbal cues for attending to task at hand                          Pertinent Vitals/ Pain       Pain Assessment: Faces Pain Score: 6  Faces Pain Scale: Hurts little more Pain Location: R knee  Pain Descriptors / Indicators: Aching;Operative site guarding Pain Intervention(s): Monitored during session;Repositioned                                                          Frequency  Min  2X/week        Progress Toward Goals  OT Goals(current goals can now be found in the care plan section)  Progress towards OT goals: Progressing toward goals  Acute Rehab OT Goals Patient Stated Goal: to decrease pain  OT Goal Formulation: With patient Time For Goal Achievement: 08/02/17 Potential to Achieve Goals: Good  Plan Discharge plan remains appropriate                     AM-PAC PT "6 Clicks" Daily Activity     Outcome Measure   Help from another person eating meals?: None Help from another person taking care of personal grooming?: None Help from another person toileting, which includes using toliet, bedpan, or urinal?: A Little Help from another person bathing (including washing, rinsing, drying)?: A Lot Help from another person to put on and taking off regular upper body clothing?: None Help from another person to put on and taking off regular lower body clothing?: A Lot 6 Click Score: 19    End of Session Equipment Utilized During Treatment: Gait belt;Rolling walker;Right knee immobilizer  OT Visit Diagnosis: Unsteadiness on feet (R26.81);Other abnormalities of gait and mobility (R26.89);Muscle weakness (generalized) (M62.81);Pain Pain - Right/Left: Right Pain - part of body: Knee   Activity Tolerance Patient tolerated treatment well   Patient Left in bed;with call bell/phone within reach;with bed alarm set   Nurse Communication Mobility status        Time: 5852-7782 OT Time Calculation (min): 48 min  Charges: OT General Charges $OT Visit: 1 Visit OT Treatments $Self Care/Home Management : 38-52 mins  Lou Cal, OT Pager [phone] 07/24/2017    [name] 07/24/2017, 2:04 PM

## 2017-07-24 NOTE — Progress Notes (Signed)
Patient ID: Shelby Reese, female   DOB: 02-21-1935, 82 y.o.   MRN: [id number] Postoperative day 1 status post repeat irrigation and debridement for large wound anterior aspect of the right knee. Patient had good healthy granulation tissue we'll plan for allograft split-thickness skin graft tomorrow anticipate discharging with a wound VAC.

## 2017-07-24 NOTE — Progress Notes (Signed)
Pharmacy Antibiotic Note  Shelby Reese is a 81 y.o. female admitted on 07/17/2017 with cellulitis.  Today is day #8 of antibiotics. All cultures remain negative. Patient went for a repeat I&D yesterday.   Plan: Continue vancomycin to 1g/24h.  Goal trough 15-20 mcg/ml Continue Ceftriaxone 2 g IV q24h Check VT over the weekend if continues Monitor renal function, culture results.   Height: 5\' 5"  (165.1 cm) Weight: 235 lb (106.6 kg) IBW/kg (Calculated) : 57  Temp (24hrs), Avg:98.2 F (36.8 C), Min:97.8 F (36.6 C), Max:98.6 F (37 C)   Recent Labs Lab 07/17/17 1827 07/17/17 1839 07/18/17 0403 07/19/17 0339 07/20/17 0423 07/21/17 0252 07/21/17 2040 07/23/17 0307 07/24/17 0452  WBC 11.4*  --  10.0 12.3* 10.7* 9.9  --   --   --   CREATININE 1.49*  --  1.20* 1.20* 1.38* 1.44*  --  0.94 1.03*  LATICACIDVEN  --  0.83  --   --   --   --   --   --   --   VANCOTROUGH  --   --   --   --   --   --  21*  --   --     Estimated Creatinine Clearance: 53.7 mL/min (A) (by C-G formula based on SCr of 1.03 mg/dL (H)).    Allergies  Allergen Reactions  . Bactrim [Sulfamethoxazole-Trimethoprim] Diarrhea and Nausea Only  . Ciprofloxacin Other (See Comments)    tremors  . Diovan [Valsartan] Other (See Comments)    Extreme vertigo  . Food     Orange Juice-upset stomach/diarrhea  . Latex Other (See Comments)    Rash/inflammation due to exposure  . Nitrofuran Derivatives Hives    "Full body rash"  . Penicillins Hives and Swelling    Has patient had a PCN reaction causing immediate rash, facial/tongue/throat swelling, SOB or lightheadedness with hypotension:No--severe irritation at the injection site Has patient had a PCN reaction causing severe rash involving mucus membranes or skin necrosis:Unknown Has patient had a PCN reaction that required hospitalization:No Has patient had a PCN reaction occurring within the last 10 years:Yes If all of the above answers are "NO", then may proceed  with Cephalosporin use.   . Sulfa Antibiotics Diarrhea and Nausea Only  . Other Rash    Mycins    Antimicrobials this admission: 8/30 vanc >> 8/30 cefepime>> x1 8/30 flagyl>> x1 8/31 Ceftriaxone >>  Dose adjustments this admission: 9/3 VT = 21 on 1250 mg IV q24h > decr to 1 g IV q24h   Microbiology results: 8/30 BCx x2: ngF 8/31 MRSA PCR: negative    Hughes Better, PharmD, BCPS Clinical Pharmacist 07/24/2017 10:31 AM

## 2017-07-24 NOTE — Progress Notes (Signed)
PROGRESS NOTE    Shelby Reese  KVQ:[id number] DOB: 02/20/1935 DOA: 07/17/2017 PCP: [name], MD    Brief Narrative:  82 year old female who presents to the leg pain. Patient is known to have hypertension, hypothyroidism and chronic kidney disease. About 2 months prior to hospitalization she developed a right knee traumatic hematoma, spontaneously drain about 3 weeks ago and recently complicated with erythema, pain and worsening edema. She was seen by orthopedics as an outpatient, she was started on antibiotics, with recommendations for surgical debridement. On the initial physical examination blood pressure 165/58, heart rate 97, temperature 98.5,respiratory rate 16, oxygen saturation 98%. Moist mucous membranes, lungs clear to auscultation, heart S1-S2 present and rhythmic, trace lower extremity edema. Right knee with a large ulcerated wound, throughout the right knee, purulent drainage, unstageable. Sodium 134, potassium 4.1, chloride 102, bicarbonate 21, glucose 139, BUN 34, creatinine 1.49, white count 11.4, hemoglobin 9.5, hematocrit 30.9, platelets 373. Urinalysis negative for infection.  Patient admitted to the hospital working diagnosis of large right knee infected ulcerated wound.  Patient s/p irrigation and debridement by orthopedics on 08/30 and 09/5.  Planning for  Skin graft 09/7.    Assessment & Plan:   Principal Problem:   Cellulitis Active Problems:   Hypothyroidism, acquired   Anemia, iron deficiency   Essential hypertension   Wound, open, knee, lower leg, or ankle with complication, right, initial encounter   Large right knee infected ulcerated wound.  Patient hadirrigation and debridement 08/31 and 9/5.  Skin grafting planned tomorrow., continue antibiotic therapy with IV ceftriaxone, and vancomycin (8/31-  ) (received dose of cefepime, vanc, and flagyl on 8/30)  Blood cultures have remained with no growth. Continue pain control. Wbc 9,9. Patient afebrile.   Will need to plan duration of abx therapy.    Hypertension Blood pressure 130 to 150, continue diltiazem.  Will d/c prn hydral and CTM. Continue to hold on ace inh due to hyperkalemia.  Acute post operative anemia on chronic anemia with iron deficiency SP one unit prbc transfused.  F/u CBC   Hypothyroidism Continue withlevothyroxine, per home regimen.   CKD stage 3 Patient tolerating po well, off IV fluids, will follow on renal panel in am, avoid hypotension or nephrotoxic medications. Follow vancomycin level per pharmacy protocol, vanc level 21 on 9/3.    DVT prophylaxis: lovenox Code Status: full  Family Communication: orthopedics Disposition Plan: pending PT eval   Consultants:   orthopedics  Procedures: (Don't include imaging studies which can be auto populated. Include things that cannot be auto populated i.e. Echo, Carotid and venous dopplers, Foley, Bipap, HD, tubes/drains, wound vac, central lines etc)  I&D 8/31 and 9/5  Antimicrobials: (specify start and planned stop date. Auto populated tables are space occupying and do not give end dates)  Ceftriaxone/vancomycin    Subjective: Doing ok, looking forward to breakfast.  Knee bothers her with certain positions and at certain times, but otherwise, looking forward to regular meal.   Objective: Vitals:   07/23/17 1721 07/23/17 2108 07/23/17 2300 07/24/17 0424  BP:  140/80 132/60 120/62  Pulse: 85 72 74 87  Resp:  20 18 18   Temp: 98.4 F (36.9 C) 98.2 F (36.8 C) 97.8 F (36.6 C) 98 F (36.7 C)  TempSrc: Oral Oral Oral Oral  SpO2: 98% 95% 95% 99%  Weight:      Height:        Intake/Output Summary (Last 24 hours) at 07/24/17 0806 Last data filed at 07/23/17 1350  Gross  per 24 hour  Intake              625 ml  Output               50 ml  Net              575 ml   Filed Weights   07/17/17 2046 07/18/17 1016 07/23/17 1117  Weight: 106.6 kg (235 lb) 106.6 kg (235 lb) 106.6 kg (235 lb)     Examination:  General exam: Appears calm and comfortable  Respiratory system: Clear to auscultation. Respiratory effort normal. Cardiovascular system: S1 & S2 heard, systolic 2/6 murmur, RRR.  Gastrointestinal system: Abdomen is nondistended, soft and nontender. No organomegaly or masses felt. Normal bowel sounds heard. Central nervous system: Alert and oriented. No focal neurological deficits. Extremities: Palpable pedal pulses.  Wound vac in place R knee with serosanguinous drainage Skin: No rashes, lesions or ulcers Psychiatry: Judgement and insight appear normal. Mood & affect appropriate.     Data Reviewed: I have personally reviewed following labs and imaging studies  CBC:  Recent Labs Lab 07/17/17 1827 07/18/17 0403 07/19/17 0339 07/20/17 0423 07/21/17 0252  WBC 11.4* 10.0 12.3* 10.7* 9.9  NEUTROABS 8.6*  --  10.6* 7.2 6.4  HGB 9.5* 8.8* 7.9* 7.0* 8.6*  HCT 30.9* 29.5* 26.2* 23.7* 29.0*  MCV 87.5 88.1 87.6 89.1 89.5  PLT 373 332 331 326 268   Basic Metabolic Panel:  Recent Labs Lab 07/18/17 0403 07/19/17 0339 07/20/17 0423 07/21/17 0252 07/23/17 0307 07/24/17 0452  NA 137 135 136 136 135  --   K 4.3 5.2* 4.7 4.4 3.9  --   CL 107 107 107 107 105  --   CO2 20* 22 22 21* 23  --   GLUCOSE 107* 134* 136* 97 95  --   BUN 26* 22* 31* 30* 18  --   CREATININE 1.20* 1.20* 1.38* 1.44* 0.94 1.03*  CALCIUM 9.6 8.8* 8.4* 8.4* 8.3*  --    GFR: Estimated Creatinine Clearance: 53.7 mL/min (A) (by C-G formula based on SCr of 1.03 mg/dL (H)). Liver Function Tests:  Recent Labs Lab 07/17/17 1827  AST 19  ALT 14  ALKPHOS 56  BILITOT 0.4  PROT 7.7  ALBUMIN 3.7   No results for input(s): LIPASE, AMYLASE in the last 168 hours. No results for input(s): AMMONIA in the last 168 hours. Coagulation Profile: No results for input(s): INR, PROTIME in the last 168 hours. Cardiac Enzymes: No results for input(s): CKTOTAL, CKMB, CKMBINDEX, TROPONINI in the last 168  hours. BNP (last 3 results) No results for input(s): PROBNP in the last 8760 hours. HbA1C: No results for input(s): HGBA1C in the last 72 hours. CBG: No results for input(s): GLUCAP in the last 168 hours. Lipid Profile: No results for input(s): CHOL, HDL, LDLCALC, TRIG, CHOLHDL, LDLDIRECT in the last 72 hours. Thyroid Function Tests: No results for input(s): TSH, T4TOTAL, FREET4, T3FREE, THYROIDAB in the last 72 hours. Anemia Panel: No results for input(s): VITAMINB12, FOLATE, FERRITIN, TIBC, IRON, RETICCTPCT in the last 72 hours. Sepsis Labs:  Recent Labs Lab 07/17/17 1839  LATICACIDVEN 0.83    Recent Results (from the past 240 hour(s))  Blood culture (routine x 2)     Status: None   Collection Time: 07/17/17  8:55 PM  Result Value Ref Range Status   Specimen Description BLOOD RIGHT ANTECUBITAL  Final   Special Requests   Final    BOTTLES DRAWN AEROBIC AND ANAEROBIC  Blood Culture adequate volume   Culture   Final    NO GROWTH 5 DAYS Performed at [facility], [place] [address] [place], [place] [address]    Report Status 07/23/2017 FINAL  Final  Blood culture (routine x 2)     Status: None   Collection Time: 07/17/17  8:55 PM  Result Value Ref Range Status   Specimen Description BLOOD BLOOD LEFT FOREARM  Final   Special Requests IN PEDIATRIC BOTTLE Blood Culture adequate volume  Final   Culture   Final    NO GROWTH 5 DAYS Performed at [facility], [place] [address] [place], [place] [address]    Report Status 07/23/2017 FINAL  Final  MRSA PCR Screening     Status: None   Collection Time: 07/18/17  4:12 AM  Result Value Ref Range Status   MRSA by PCR NEGATIVE NEGATIVE Final    Comment:        The GeneXpert MRSA Assay (FDA approved for NASAL specimens only), is one component of a comprehensive MRSA colonization surveillance program. It is not intended to diagnose MRSA infection nor to guide or monitor treatment for MRSA infections.           Radiology Studies: No results found.      Scheduled Meds: . aspirin EC  81 mg Oral Q breakfast  . diltiazem  120 mg Oral Q breakfast  . docusate sodium  100 mg Oral BID  . enoxaparin (LOVENOX) injection  40 mg Subcutaneous Q24H  . famotidine  20 mg Oral Q1500  . ferrous sulfate  325 mg Oral QHS  . levothyroxine  175 mcg Oral QAC breakfast  . magnesium oxide  200 mg Oral Q1500   Continuous Infusions: . sodium chloride 10 mL/hr at 07/23/17 1347  . sodium chloride    . cefTRIAXone (ROCEPHIN)  IV Stopped (07/24/17 0630)  . lactated ringers 10 mL/hr at 07/23/17 1120  . methocarbamol (ROBAXIN)  IV    . vancomycin Stopped (07/24/17 0049)     LOS: 7 days    Time spent: 35 minutes    A Melven Sartorius, MD [facility] [phone]   If 7PM-7AM, please contact night-coverage [email or web address] Password [email or web address] 07/24/2017, 8:06 AM

## 2017-07-25 ENCOUNTER — Inpatient Hospital Stay (HOSPITAL_COMMUNITY): Payer: Medicare Other | Admitting: Certified Registered Nurse Anesthetist

## 2017-07-25 ENCOUNTER — Encounter (HOSPITAL_COMMUNITY): Payer: Self-pay | Admitting: Certified Registered Nurse Anesthetist

## 2017-07-25 ENCOUNTER — Encounter (HOSPITAL_COMMUNITY)
Admission: EM | Disposition: A | Discharge status: Home Health | Payer: Self-pay | Source: Home / Self Care | Attending: Family Medicine

## 2017-07-25 DIAGNOSIS — L8990 Pressure ulcer of unspecified site, unspecified stage: Secondary | ICD-10-CM | POA: Insufficient documentation

## 2017-07-25 HISTORY — PX: SKIN SPLIT GRAFT: SHX444

## 2017-07-25 LAB — CBC
HCT: 28.7 % — ABNORMAL LOW (ref 36.0–46.0)
Hemoglobin: 8.5 g/dL — ABNORMAL LOW (ref 12.0–15.0)
MCH: 26.6 pg (ref 26.0–34.0)
MCHC: 29.6 g/dL — ABNORMAL LOW (ref 30.0–36.0)
MCV: 90 fL (ref 78.0–100.0)
Platelets: 381 K/uL (ref 150–400)
RBC: 3.19 MIL/uL — ABNORMAL LOW (ref 3.87–5.11)
RDW: 14.9 % (ref 11.5–15.5)
WBC: 15.3 K/uL — ABNORMAL HIGH (ref 4.0–10.5)

## 2017-07-25 SURGERY — APPLICATION, GRAFT, SKIN, SPLIT-THICKNESS
Anesthesia: General | Laterality: Right

## 2017-07-25 MED ORDER — 0.9 % SODIUM CHLORIDE (POUR BTL) OPTIME
TOPICAL | Status: DC | PRN
  Administered 2017-07-25: 1000 mL

## 2017-07-25 MED ORDER — PROPOFOL 10 MG/ML IV BOLUS
INTRAVENOUS | Status: AC
  Filled 2017-07-25: qty 20

## 2017-07-25 MED ORDER — VANCOMYCIN HCL IN DEXTROSE 1-5 GM/200ML-% IV SOLN
1000.0000 mg | INTRAVENOUS | Status: DC
  Administered 2017-07-26 – 2017-07-27 (×2): 1000 mg via INTRAVENOUS
  Filled 2017-07-25 (×3): qty 200

## 2017-07-25 MED ORDER — FENTANYL CITRATE (PF) 250 MCG/5ML IJ SOLN
INTRAMUSCULAR | Status: DC | PRN
  Administered 2017-07-25: 50 ug via INTRAVENOUS
  Administered 2017-07-25: 25 ug via INTRAVENOUS

## 2017-07-25 MED ORDER — OXYCODONE HCL 5 MG PO TABS: 5.0000 mg | ORAL_TABLET | Freq: Once | ORAL | Status: DC | PRN

## 2017-07-25 MED ORDER — ONDANSETRON HCL 4 MG/2ML IJ SOLN
INTRAMUSCULAR | Status: DC | PRN
  Administered 2017-07-25: 4 mg via INTRAVENOUS

## 2017-07-25 MED ORDER — LIDOCAINE HCL (CARDIAC) 20 MG/ML IV SOLN
INTRAVENOUS | Status: DC | PRN
  Administered 2017-07-25: 60 mg via INTRATRACHEAL

## 2017-07-25 MED ORDER — VANCOMYCIN HCL IN DEXTROSE 1-5 GM/200ML-% IV SOLN: 1000.0000 mg | INTRAVENOUS | Status: DC

## 2017-07-25 MED ORDER — OXYCODONE HCL 5 MG/5ML PO SOLN: 5.0000 mg | Freq: Once | ORAL | Status: DC | PRN

## 2017-07-25 MED ORDER — PROPOFOL 10 MG/ML IV BOLUS
INTRAVENOUS | Status: DC | PRN
Start: 2017-07-25 — End: 2017-07-25
  Administered 2017-07-25: 150 mg via INTRAVENOUS

## 2017-07-25 MED ORDER — ONDANSETRON HCL 4 MG/2ML IJ SOLN
INTRAMUSCULAR | Status: AC
  Filled 2017-07-25: qty 2

## 2017-07-25 MED ORDER — FENTANYL CITRATE (PF) 250 MCG/5ML IJ SOLN
INTRAMUSCULAR | Status: AC
  Filled 2017-07-25: qty 5

## 2017-07-25 MED ORDER — GLYCOPYRROLATE 0.2 MG/ML IJ SOLN
INTRAMUSCULAR | Status: DC | PRN
  Administered 2017-07-25: 0.2 mg via INTRAVENOUS

## 2017-07-25 MED ORDER — LACTATED RINGERS IV SOLN
INTRAVENOUS | Status: DC | PRN
  Administered 2017-07-25: 07:00:00 via INTRAVENOUS

## 2017-07-25 MED ORDER — VANCOMYCIN HCL IN DEXTROSE 1-5 GM/200ML-% IV SOLN: 1000.0000 mg | Freq: Once | INTRAVENOUS | Status: DC

## 2017-07-25 MED ORDER — LIDOCAINE 2% (20 MG/ML) 5 ML SYRINGE
INTRAMUSCULAR | Status: AC
  Filled 2017-07-25: qty 5

## 2017-07-25 MED ORDER — HYDROMORPHONE HCL 1 MG/ML IJ SOLN
0.2500 mg | INTRAMUSCULAR | Status: DC | PRN
  Administered 2017-07-25: 1 mg via INTRAVENOUS

## 2017-07-25 MED ORDER — HYDROMORPHONE HCL 1 MG/ML IJ SOLN
INTRAMUSCULAR | Status: AC
  Administered 2017-07-25: 1 mg
  Filled 2017-07-25: qty 1

## 2017-07-25 SURGICAL SUPPLY — 47 items
BNDG CMPR 9X4 STRL LF SNTH (GAUZE/BANDAGES/DRESSINGS)
BNDG COHESIVE 6X5 TAN STRL LF (GAUZE/BANDAGES/DRESSINGS) IMPLANT
BNDG ESMARK 4X9 LF (GAUZE/BANDAGES/DRESSINGS) ×1 IMPLANT
BNDG GAUZE STRTCH 6 (GAUZE/BANDAGES/DRESSINGS) IMPLANT
COVER SURGICAL LIGHT HANDLE (MISCELLANEOUS) ×4 IMPLANT
CUFF TOURNIQUET SINGLE 18IN (TOURNIQUET CUFF) IMPLANT
CUFF TOURNIQUET SINGLE 24IN (TOURNIQUET CUFF) IMPLANT
DERMACARRIERS GRAFT 1 TO 1.5 (DISPOSABLE)
DRAPE U-SHAPE 47X51 STRL (DRAPES) ×3 IMPLANT
DRSG ADAPTIC 3X8 NADH LF (GAUZE/BANDAGES/DRESSINGS) IMPLANT
DRSG MEPITEL 4X7.2 (GAUZE/BANDAGES/DRESSINGS) ×1 IMPLANT
DRSG VAC ATS MED SENSATRAC (GAUZE/BANDAGES/DRESSINGS) ×2 IMPLANT
DURAPREP 26ML APPLICATOR (WOUND CARE) ×3 IMPLANT
ELECT REM PT RETURN 9FT ADLT (ELECTROSURGICAL) ×3
ELECTRODE REM PT RTRN 9FT ADLT (ELECTROSURGICAL) ×1 IMPLANT
GAUZE SPONGE 4X4 12PLY STRL (GAUZE/BANDAGES/DRESSINGS) IMPLANT
GLOVE BIOGEL PI IND STRL 9 (GLOVE) ×1 IMPLANT
GLOVE BIOGEL PI INDICATOR 9 (GLOVE) ×2
GLOVE SURG ORTHO 9.0 STRL STRW (GLOVE) ×3 IMPLANT
GOWN STRL REUS W/ TWL XL LVL3 (GOWN DISPOSABLE) ×2 IMPLANT
GOWN STRL REUS W/TWL XL LVL3 (GOWN DISPOSABLE) ×6
GRAFT DERMACARRIERS 1 TO 1.5 (DISPOSABLE) IMPLANT
GRAFT TISS THERASKIN 2X3 (Tissue) IMPLANT
GRAFT TISS THERASKIN 3X6 (Tissue) IMPLANT
KIT BASIN OR (CUSTOM PROCEDURE TRAY) ×3 IMPLANT
KIT ROOM TURNOVER OR (KITS) ×3 IMPLANT
MANIFOLD NEPTUNE II (INSTRUMENTS) ×3 IMPLANT
NDL HYPO 25GX1X1/2 BEV (NEEDLE) IMPLANT
NEEDLE HYPO 25GX1X1/2 BEV (NEEDLE) IMPLANT
NS IRRIG 1000ML POUR BTL (IV SOLUTION) ×3 IMPLANT
PACK ORTHO EXTREMITY (CUSTOM PROCEDURE TRAY) ×3 IMPLANT
PAD ARMBOARD 7.5X6 YLW CONV (MISCELLANEOUS) ×6 IMPLANT
PAD CAST 4YDX4 CTTN HI CHSV (CAST SUPPLIES) IMPLANT
PADDING CAST COTTON 4X4 STRL (CAST SUPPLIES)
STAPLER VISISTAT 35W (STAPLE) ×2 IMPLANT
SUCTION FRAZIER HANDLE 10FR (MISCELLANEOUS)
SUCTION TUBE FRAZIER 10FR DISP (MISCELLANEOUS) IMPLANT
SUT ETHILON 4 0 PS 2 18 (SUTURE) IMPLANT
SYR CONTROL 10ML LL (SYRINGE) IMPLANT
TISSUE THERASKIN 2X3 (Tissue) ×6 IMPLANT
TISSUE THERASKIN 3X6 (Tissue) ×3 IMPLANT
TOWEL OR 17X24 6PK STRL BLUE (TOWEL DISPOSABLE) ×1 IMPLANT
TOWEL OR 17X26 10 PK STRL BLUE (TOWEL DISPOSABLE) ×3 IMPLANT
TUBE CONNECTING 12'X1/4 (SUCTIONS)
TUBE CONNECTING 12X1/4 (SUCTIONS) IMPLANT
WATER STERILE IRR 1000ML POUR (IV SOLUTION) ×1 IMPLANT
WND VAC CANISTER 500ML (MISCELLANEOUS) ×2 IMPLANT

## 2017-07-25 NOTE — Progress Notes (Signed)
Pharmacy Antibiotic Note  Shelby Reese is a 82 y.o. female admitted on 07/17/2017 with cellulitis, s/p multiple I&Ds.  Patient continues on vancomycin and ceftriaxone, today is day #9 of antibiotics.   Patient is afebrile and his WBC trended up.  His SCr is starting to trend back up again.  Previous vancomycin trough was slightly elevated and dose was reduced on 07/21/17.  Patient received an extra dose of vancomycin in the OR today (unsure why).  Expect vancomycin level to be elevated so will not obtain one.   Plan: - Continue vanc 1gm IV Q24H, postpone tonight's dose until tomorrow.  Could consider checking a level on Monday if concerned with toxicity - Continue CTX 2gm IV Q24H - Monitor renal fxn, clinical progress   Height: 5\' 5"  (165.1 cm) Weight: 235 lb (106.6 kg) IBW/kg (Calculated) : 57  Temp (24hrs), Avg:98.1 F (36.7 C), Min:97.3 F (36.3 C), Max:98.6 F (37 C)   Recent Labs Lab 07/19/17 0339 07/20/17 0423 07/21/17 0252 07/21/17 2040 07/23/17 0307 07/24/17 0452 07/25/17 0408  WBC 12.3* 10.7* 9.9  --   --   --  15.3*  CREATININE 1.20* 1.38* 1.44*  --  0.94 1.03*  --   VANCOTROUGH  --   --   --  21*  --   --   --     Estimated Creatinine Clearance: 53.7 mL/min (A) (by C-G formula based on SCr of 1.03 mg/dL (H)).    Allergies  Allergen Reactions  . Bactrim [Sulfamethoxazole-Trimethoprim] Diarrhea and Nausea Only  . Ciprofloxacin Other (See Comments)    tremors  . Diovan [Valsartan] Other (See Comments)    Extreme vertigo  . Food     Orange Juice-upset stomach/diarrhea  . Latex Other (See Comments)    Rash/inflammation due to exposure  . Nitrofuran Derivatives Hives    "Full body rash"  . Penicillins Hives and Swelling    Has patient had a PCN reaction causing immediate rash, facial/tongue/throat swelling, SOB or lightheadedness with hypotension:No--severe irritation at the injection site Has patient had a PCN reaction causing severe rash involving mucus  membranes or skin necrosis:Unknown Has patient had a PCN reaction that required hospitalization:No Has patient had a PCN reaction occurring within the last 10 years:Yes If all of the above answers are "NO", then may proceed with Cephalosporin use.   . Sulfa Antibiotics Diarrhea and Nausea Only  . Other Rash    Mycins    Vanc 8/30 >> CTX 8/31 >> Cefepime/Flagyl x 1 8/30  9/3 VT = 21 on 1250mg  q24h >> decr to 1g/24h  8/30 BCx: negative 8/31 MRSA PCR: negative   Ibrahem Volkman D. Mina Marble, PharmD, BCPS Pager:  [phone] 07/25/2017, 1:36 PM

## 2017-07-25 NOTE — Consult Note (Addendum)
[place] Nurse wound consult note Dr Sharol Given of the ortho service following for assessment and plan of care to other wounds. Reason for Consult: Consult requested for right heel Wound type: Dark purple deep tissue injury Pressure Injury POA: No Measurement:3X2cm Dressing procedure/placement/frequency: Discussed with pt that deep tissue injuries are high risk to evolve into full thickness tissue loss.  Applied foam dressing to protect from further injury and ordered Prevalon boot to reduce pressure to the affected area.  Pt verbalized understanding. Please re-consult if further assistance is needed.  Thank-you,  Julien Girt MSN, [place], [place], [place], [place]

## 2017-07-25 NOTE — H&P (View-Only) (Signed)
Patient ID: Shelby Reese, female   DOB: 12/12/1934, 82 y.o.   MRN: [id number] Postoperative day 1 status post repeat irrigation and debridement for large wound anterior aspect of the right knee. Patient had good healthy granulation tissue we'll plan for allograft split-thickness skin graft tomorrow anticipate discharging with a wound VAC.

## 2017-07-25 NOTE — Progress Notes (Signed)
PROGRESS NOTE    Shelby Reese  WUJ:[id number] DOB: 10/13/1935 DOA: 07/17/2017 PCP: [name], MD    Brief Narrative:  82 year old female who presents to the leg pain. Patient is known to have hypertension, hypothyroidism and chronic kidney disease. About 2 months prior to hospitalization she developed a right knee traumatic hematoma, spontaneously drain about 3 weeks ago and recently complicated with erythema, pain and worsening edema. She was seen by orthopedics as an outpatient, she was started on antibiotics, with recommendations for surgical debridement. On the initial physical examination blood pressure 165/58, heart rate 97, temperature 98.5,respiratory rate 16, oxygen saturation 98%. Moist mucous membranes, lungs clear to auscultation, heart S1-S2 present and rhythmic, trace lower extremity edema. Right knee with a large ulcerated wound, throughout the right knee, purulent drainage, unstageable. Sodium 134, potassium 4.1, chloride 102, bicarbonate 21, glucose 139, BUN 34, creatinine 1.49, white count 11.4, hemoglobin 9.5, hematocrit 30.9, platelets 373. Urinalysis negative for infection.  Patient admitted to the hospital working diagnosis of large right knee infected ulcerated wound.  Patient s/p irrigation and debridement by orthopedics on 08/30 and 09/5.  Skin graft on 9/7.    Assessment & Plan:   Principal Problem:   Cellulitis Active Problems:   Hypothyroidism, acquired   Anemia, iron deficiency   Essential hypertension   Wound, open, knee, lower leg, or ankle with complication, right, initial encounter   Large right knee infected ulcerated wound.  Patient hadirrigation and debridement 08/31 and 9/5.  Skin grafting on 9/7. continue antibiotic therapy with IV ceftriaxone, and vancomycin (8/31-  ) (received dose of cefepime, vanc, and flagyl on 8/30)  Blood cultures have remained with no growth. Continue pain control. Increased WBC today, but otherwise stable. Patient  afebrile.  Will need to plan duration of abx therapy.    Hypertension Blood pressure 130 to 150, continue diltiazem.  Will d/c prn hydral and CTM. Continue to hold on ace inh due to hyperkalemia.  Acute post operative anemia on chronic anemia with iron deficiency SP one unit prbc transfused.  F/u CBC   Hypothyroidism Continue withlevothyroxine, per home regimen.   CKD stage 3 Patient tolerating po well, off IV fluids, will follow on renal panel in am, avoid hypotension or nephrotoxic medications. Discussed vanc with pharmacy today.   Pressure ulcer: stage 1 to R heel, wound c/s  DVT prophylaxis: lovenox Code Status: full  Family Communication: orthopedics Disposition Plan: pending PT eval   Consultants:   orthopedics  Procedures: (Don't include imaging studies which can be auto populated. Include things that cannot be auto populated i.e. Echo, Carotid and venous dopplers, Foley, Bipap, HD, tubes/drains, wound vac, central lines etc)  I&D 8/31 and 9/5  Skin graft 9/7  Antimicrobials: (specify start and planned stop date. Auto populated tables are space occupying and do not give end dates)  Ceftriaxone/vancomycin    Subjective: Knee is "there".  No complaints today.  Combing hair.   Objective: Vitals:   07/25/17 0418 07/25/17 0830 07/25/17 0845 07/25/17 0852  BP: 140/82   (!) 185/90  Pulse: 87 (!) 107 97 88  Resp: 18 10 18 18   Temp: 98.3 F (36.8 C) 98 F (36.7 C)  (!) 97.3 F (36.3 C)  TempSrc: Oral     SpO2: 98% 97% 99% 96%  Weight:      Height:        Intake/Output Summary (Last 24 hours) at 07/25/17 1111 Last data filed at 07/25/17 0823  Gross per 24 hour  Intake  700 ml  Output              435 ml  Net              265 ml   Filed Weights   07/17/17 2046 07/18/17 1016 07/23/17 1117  Weight: 106.6 kg (235 lb) 106.6 kg (235 lb) 106.6 kg (235 lb)    Examination:  General: No acute distress. Cardiovascular: Heart sounds show a  regular rate, and rhythm. 2/6 murmur.  No gallops or rubs. No JVD. Lungs: Clear to auscultation bilaterally with good air movement. No rales, rhonchi or wheezes. Abdomen: Soft, nontender, nondistended with normal active bowel sounds. No masses. No hepatosplenomegaly. Neurological: Alert and oriented 3. Moves all extremities 4 with equal strength. Cranial nerves II through XII grossly intact. Skin: Warm and dry. No rashes or lesions. Extremities: R knee in wound vac.  R heel with nonblanching erythema.  Pedal pulses 2+. Psychiatric: Mood and affect are normal. Insight and judgment are appropriate.   Data Reviewed: I have personally reviewed following labs and imaging studies  CBC:  Recent Labs Lab 07/19/17 0339 07/20/17 0423 07/21/17 0252 07/25/17 0408  WBC 12.3* 10.7* 9.9 15.3*  NEUTROABS 10.6* 7.2 6.4  --   HGB 7.9* 7.0* 8.6* 8.5*  HCT 26.2* 23.7* 29.0* 28.7*  MCV 87.6 89.1 89.5 90.0  PLT 331 326 299 161   Basic Metabolic Panel:  Recent Labs Lab 07/19/17 0339 07/20/17 0423 07/21/17 0252 07/23/17 0307 07/24/17 0452  NA 135 136 136 135  --   K 5.2* 4.7 4.4 3.9  --   CL 107 107 107 105  --   CO2 22 22 21* 23  --   GLUCOSE 134* 136* 97 95  --   BUN 22* 31* 30* 18  --   CREATININE 1.20* 1.38* 1.44* 0.94 1.03*  CALCIUM 8.8* 8.4* 8.4* 8.3*  --    GFR: Estimated Creatinine Clearance: 53.7 mL/min (A) (by C-G formula based on SCr of 1.03 mg/dL (H)). Liver Function Tests: No results for input(s): AST, ALT, ALKPHOS, BILITOT, PROT, ALBUMIN in the last 168 hours. No results for input(s): LIPASE, AMYLASE in the last 168 hours. No results for input(s): AMMONIA in the last 168 hours. Coagulation Profile: No results for input(s): INR, PROTIME in the last 168 hours. Cardiac Enzymes: No results for input(s): CKTOTAL, CKMB, CKMBINDEX, TROPONINI in the last 168 hours. BNP (last 3 results) No results for input(s): PROBNP in the last 8760 hours. HbA1C: No results for input(s):  HGBA1C in the last 72 hours. CBG: No results for input(s): GLUCAP in the last 168 hours. Lipid Profile: No results for input(s): CHOL, HDL, LDLCALC, TRIG, CHOLHDL, LDLDIRECT in the last 72 hours. Thyroid Function Tests: No results for input(s): TSH, T4TOTAL, FREET4, T3FREE, THYROIDAB in the last 72 hours. Anemia Panel: No results for input(s): VITAMINB12, FOLATE, FERRITIN, TIBC, IRON, RETICCTPCT in the last 72 hours. Sepsis Labs: No results for input(s): PROCALCITON, LATICACIDVEN in the last 168 hours.  Recent Results (from the past 240 hour(s))  Blood culture (routine x 2)     Status: None   Collection Time: 07/17/17  8:55 PM  Result Value Ref Range Status   Specimen Description BLOOD RIGHT ANTECUBITAL  Final   Special Requests   Final    BOTTLES DRAWN AEROBIC AND ANAEROBIC Blood Culture adequate volume   Culture   Final    NO GROWTH 5 DAYS Performed at [facility], [address] [place], [place] [address]  Report Status 07/23/2017 FINAL  Final  Blood culture (routine x 2)     Status: None   Collection Time: 07/17/17  8:55 PM  Result Value Ref Range Status   Specimen Description BLOOD BLOOD LEFT FOREARM  Final   Special Requests IN PEDIATRIC BOTTLE Blood Culture adequate volume  Final   Culture   Final    NO GROWTH 5 DAYS Performed at [facility], [employer] [address] [place], [place] [address]    Report Status 07/23/2017 FINAL  Final  MRSA PCR Screening     Status: None   Collection Time: 07/18/17  4:12 AM  Result Value Ref Range Status   MRSA by PCR NEGATIVE NEGATIVE Final    Comment:        The GeneXpert MRSA Assay (FDA approved for NASAL specimens only), is one component of a comprehensive MRSA colonization surveillance program. It is not intended to diagnose MRSA infection nor to guide or monitor treatment for MRSA infections.          Radiology Studies: No results found.      Scheduled Meds: . aspirin EC  81 mg Oral Q breakfast  .  diltiazem  120 mg Oral Q breakfast  . docusate sodium  100 mg Oral BID  . enoxaparin (LOVENOX) injection  40 mg Subcutaneous Q24H  . famotidine  20 mg Oral Q1500  . ferrous sulfate  325 mg Oral QHS  . levothyroxine  175 mcg Oral QAC breakfast  . magnesium oxide  200 mg Oral Q1500   Continuous Infusions: . sodium chloride 10 mL/hr at 07/25/17 0029  . sodium chloride Stopped (07/24/17 0934)  . cefTRIAXone (ROCEPHIN)  IV Stopped (07/25/17 0535)  . lactated ringers 10 mL/hr at 07/23/17 1120  . methocarbamol (ROBAXIN)  IV    . vancomycin 0 mg (07/25/17 0129)     LOS: 8 days    Time spent: 35 minutes    A Melven Sartorius, MD [facility] [phone]   If 7PM-7AM, please contact night-coverage [email or web address] Password [email or web address] 07/25/2017, 11:11 AM

## 2017-07-25 NOTE — Op Note (Signed)
07/17/2017 - 07/25/2017  8:31 AM  PATIENT:  Shelby Reese    PRE-OPERATIVE DIAGNOSIS:  Necrotic Wound Right Knee  POST-OPERATIVE DIAGNOSIS:  Same  PROCEDURE:  Repeat Irrigation and Debridement Right Knee, Split Thickness Skin Graft Application wound VAC Local tissue rearrangement for partial wound closure for wound 27 x 10 cm and 1 cm deep  SURGEON:  [name], MD  PHYSICIAN ASSISTANT:None ANESTHESIA:   General  PREOPERATIVE INDICATIONS:  [name] is a  82 y.o. female with a diagnosis of Necrotic Wound Right Knee who failed conservative measures and elected for surgical management.    The risks benefits and alternatives were discussed with the patient preoperatively including but not limited to the risks of infection, bleeding, nerve injury, cardiopulmonary complications, the need for revision surgery, among others, and the patient was willing to proceed.  OPERATIVE IMPLANTS: Allograft skin graft 3 x 6 and 2 x 32   OPERATIVE FINDINGS: Good granulation tissue. Patient was also developing a right heel decubitus ulcer will have a PRAFO applied  OPERATIVE PROCEDURE: Patient was brought to the operating room and underwent a general anesthetic After adequate levels anesthesia were obtained patient's right lower extremity was prepped using Betadine scrub and Betadine paint and draped into a sterile field a timeout was called. Further wound edges were ellipsed out of necrotic tissue with a 10 blade knife. Knife and Rozetta Nunnery was used to further debride the granulation wound bed this had good granulation tissue. The wound was irrigated with normal saline electrocautery was used for hemostasis. The wound measures 10 x 27 cm and 1 cm deep. 2-0 nylon was used to perform local tissue rearrangement for partial wound closure. Patient then underwent application of allograft skin graft 1 piece of 3 x 6 and 2 pieces of the 3 x 2 split thickness skin graft was used this was held in place with staples.  Mepitel dressing was applied this was stapled in place as well a medium wound VAC was applied this had a good suction fit patient was extubated taken to the PACU in stable condition.

## 2017-07-25 NOTE — Progress Notes (Signed)
Shelby Reese came to me to let me know that Shelby Reese has a deep tissue injury to her right heel due to surgery and she order a pressure boot for her.

## 2017-07-25 NOTE — Interval H&P Note (Signed)
History and Physical Interval Note:  07/25/2017 7:30 AM  Shelby Reese  has presented today for surgery, with the diagnosis of Necrotic Wound Right Knee  The various methods of treatment have been discussed with the patient and family. After consideration of risks, benefits and other options for treatment, the patient has consented to  Procedure(s): Repeat Irrigation and Debridement Right Knee, Split Thickness Skin Graft (Right) as a surgical intervention .  The patient's history has been reviewed, patient examined, no change in status, stable for surgery.  I have reviewed the patient's chart and labs.  Questions were answered to the patient's satisfaction.     [name]

## 2017-07-25 NOTE — Anesthesia Preprocedure Evaluation (Signed)
Anesthesia Evaluation  Patient identified by MRN, date of birth, ID band Patient awake    Reviewed: Allergy & Precautions, NPO status , Patient's Chart, lab work & pertinent test results  Airway Mallampati: II  TM Distance: >3 FB Neck ROM: Full    Dental no notable dental hx.    Pulmonary former smoker,    breath sounds clear to auscultation       Cardiovascular hypertension, + Peripheral Vascular Disease   Rhythm:Regular Rate:Normal     Neuro/Psych  Neuromuscular disease    GI/Hepatic hiatal hernia, GERD  ,  Endo/Other  Hypothyroidism   Renal/GU Renal disease     Musculoskeletal  (+) Arthritis , Fibromyalgia -  Abdominal (+) + obese,   Peds  Hematology  (+) anemia ,   Anesthesia Other Findings   Reproductive/Obstetrics                             Anesthesia Physical Anesthesia Plan  ASA: III  Anesthesia Plan: General   Post-op Pain Management:    Induction: Intravenous  PONV Risk Score and Plan: 3 and Ondansetron, Dexamethasone, Midazolam and Propofol infusion  Airway Management Planned: LMA  Additional Equipment:   Intra-op Plan:   Post-operative Plan: Extubation in OR  Informed Consent: I have reviewed the patients History and Physical, chart, labs and discussed the procedure including the risks, benefits and alternatives for the proposed anesthesia with the patient or authorized representative who has indicated his/her understanding and acceptance.     Plan Discussed with: CRNA  Anesthesia Plan Comments:         Anesthesia Quick Evaluation

## 2017-07-25 NOTE — Progress Notes (Signed)
Orthopedic Tech Progress Note Patient Details:  Shelby Reese October 25, 1935 [id number]  Ortho Devices Type of Ortho Device: Postop shoe/boot Ortho Device/Splint Location: applied post op boot/ prafo boot on pt right.  pt tolerated application well.  right foot.  Ortho Device/Splint Interventions: Application, Adjustment   Kristopher Oppenheim 07/25/2017, 2:32 PM

## 2017-07-25 NOTE — Progress Notes (Signed)
Pt c/o lump in left arm due to an infiltrated iv last evening it is bruised and swollen a ice pack was offered and dr notified

## 2017-07-25 NOTE — Anesthesia Postprocedure Evaluation (Signed)
Anesthesia Post Note  Patient: Shelby Reese  Procedure(s) Performed: Procedure(s) (LRB): Repeat Irrigation and Debridement Right Knee, Split Thickness Skin Graft (Right)     Patient location during evaluation: PACU Anesthesia Type: General Level of consciousness: awake and alert Pain management: pain level controlled Vital Signs Assessment: post-procedure vital signs reviewed and stable Respiratory status: spontaneous breathing, nonlabored ventilation, respiratory function stable and patient connected to nasal cannula oxygen Cardiovascular status: blood pressure returned to baseline and stable Postop Assessment: no signs of nausea or vomiting Anesthetic complications: no    Last Vitals:  Vitals:   07/25/17 0845 07/25/17 0852  BP:  (!) 185/90  Pulse: 97 88  Resp: 18 18  Temp:  (!) 36.3 C  SpO2: 99% 96%    Last Pain:  Vitals:   07/25/17 0852  TempSrc:   PainSc: 0-No pain                 Tyqwan Pink,JAMES TERRILL

## 2017-07-25 NOTE — Anesthesia Procedure Notes (Signed)
Procedure Name: LMA Insertion Date/Time: 07/25/2017 7:45 AM Performed by: Rica Koyanagi Pre-anesthesia Checklist: Patient identified, Emergency Drugs available, Suction available and Patient being monitored Patient Re-evaluated:Patient Re-evaluated prior to induction Oxygen Delivery Method: Circle system utilized Preoxygenation: Pre-oxygenation with 100% oxygen Induction Type: IV induction Ventilation: Oral airway inserted - appropriate to patient size LMA: LMA inserted LMA Size: 4.0 Number of attempts: 1 Placement Confirmation: positive ETCO2 and breath sounds checked- equal and bilateral Tube secured with: Tape

## 2017-07-25 NOTE — Progress Notes (Signed)
Pt back from surgery alert and orientated x4, stating in no pain and was ready to eat. Diet reordered for patient, Resting comfortable at this time

## 2017-07-25 NOTE — Transfer of Care (Signed)
Immediate Anesthesia Transfer of Care Note  Patient: Shelby Reese  Procedure(s) Performed: Procedure(s): Repeat Irrigation and Debridement Right Knee, Split Thickness Skin Graft (Right)  Patient Location: PACU  Anesthesia Type:General  Level of Consciousness: awake, alert  and patient cooperative  Airway & Oxygen Therapy: Patient Spontanous Breathing and Patient connected to face mask oxygen  Post-op Assessment: Report given to RN, Post -op Vital signs reviewed and stable, Patient moving all extremities X 4 and Patient able to stick tongue midline  Post vital signs: Reviewed and stable  Last Vitals:  Vitals:   07/24/17 2112 07/25/17 0418  BP: 140/75 140/82  Pulse: 88 87  Resp: 20 18  Temp: 36.8 C 36.8 C  SpO2: 99% 98%    Last Pain:  Vitals:   07/25/17 0418  TempSrc: Oral  PainSc:       Patients Stated Pain Goal: 3 (19/37/90 2409)  Complications: No apparent anesthesia complications

## 2017-07-26 DIAGNOSIS — D508 Other iron deficiency anemias: Secondary | ICD-10-CM

## 2017-07-26 LAB — CBC
HEMATOCRIT: 23.5 % — AB (ref 36.0–46.0)
Hemoglobin: 7 g/dL — ABNORMAL LOW (ref 12.0–15.0)
MCH: 26.6 pg (ref 26.0–34.0)
MCHC: 29.8 g/dL — AB (ref 30.0–36.0)
MCV: 89.4 fL (ref 78.0–100.0)
Platelets: 306 10*3/uL (ref 150–400)
RBC: 2.63 MIL/uL — ABNORMAL LOW (ref 3.87–5.11)
RDW: 15 % (ref 11.5–15.5)
WBC: 10 10*3/uL (ref 4.0–10.5)

## 2017-07-26 LAB — BASIC METABOLIC PANEL
ANION GAP: 6 (ref 5–15)
BUN: 27 mg/dL — AB (ref 6–20)
CALCIUM: 8.6 mg/dL — AB (ref 8.9–10.3)
CO2: 25 mmol/L (ref 22–32)
Chloride: 104 mmol/L (ref 101–111)
Creatinine, Ser: 1.19 mg/dL — ABNORMAL HIGH (ref 0.44–1.00)
GFR calc Af Amer: 49 mL/min — ABNORMAL LOW (ref >60)
GFR calc non Af Amer: 42 mL/min — ABNORMAL LOW (ref >60)
GLUCOSE: 95 mg/dL (ref 65–99)
Potassium: 4.3 mmol/L (ref 3.5–5.1)
SODIUM: 135 mmol/L (ref 135–145)

## 2017-07-26 LAB — IRON AND TIBC
Iron: 18 ug/dL — ABNORMAL LOW (ref 28–170)
SATURATION RATIOS: 6 % — AB (ref 10.4–31.8)
TIBC: 307 ug/dL (ref 250–450)
UIBC: 289 ug/dL

## 2017-07-26 LAB — FOLATE: FOLATE: 20.7 ng/mL (ref >5.9)

## 2017-07-26 LAB — HEMOGLOBIN AND HEMATOCRIT, BLOOD
HEMATOCRIT: 24.7 % — AB (ref 36.0–46.0)
Hemoglobin: 7.3 g/dL — ABNORMAL LOW (ref 12.0–15.0)

## 2017-07-26 LAB — FERRITIN: FERRITIN: 27 ng/mL (ref 11–307)

## 2017-07-26 LAB — VITAMIN B12: VITAMIN B 12: 696 pg/mL (ref 180–914)

## 2017-07-26 MED ORDER — FERROUS SULFATE 325 (65 FE) MG PO TABS
325.0000 mg | ORAL_TABLET | Freq: Two times a day (BID) | ORAL | Status: DC
  Administered 2017-07-26 – 2017-07-29 (×7): 325 mg via ORAL
  Filled 2017-07-26 (×7): qty 1

## 2017-07-26 NOTE — Progress Notes (Signed)
PROGRESS NOTE    Shelby Reese  STM:[id number] DOB: 1935/03/12 DOA: 07/17/2017 PCP: [name], MD    Brief Narrative:  82 year old female who presents to the leg pain. Patient is known to have hypertension, hypothyroidism and chronic kidney disease. About 2 months prior to hospitalization she developed Kember Boch right knee traumatic hematoma, spontaneously drain about 3 weeks ago and recently complicated with erythema, pain and worsening edema. She was seen by orthopedics as an outpatient, she was started on antibiotics, with recommendations for surgical debridement. On the initial physical examination blood pressure 165/58, heart rate 97, temperature 98.5,respiratory rate 16, oxygen saturation 98%. Moist mucous membranes, lungs clear to auscultation, heart S1-S2 present and rhythmic, trace lower extremity edema. Right knee with Taeja Debellis large ulcerated wound, throughout the right knee, purulent drainage, unstageable. Sodium 134, potassium 4.1, chloride 102, bicarbonate 21, glucose 139, BUN 34, creatinine 1.49, white count 11.4, hemoglobin 9.5, hematocrit 30.9, platelets 373. Urinalysis negative for infection.  Patient admitted to the hospital working diagnosis of large right knee infected ulcerated wound.  Patient s/p irrigation and debridement by orthopedics on 08/30 and 09/5.  Skin graft on 9/7.    Assessment & Plan:   Principal Problem:   Cellulitis Active Problems:   Hypothyroidism, acquired   Anemia, iron deficiency   Essential hypertension   Wound, open, knee, lower leg, or ankle with complication, right, initial encounter   Pressure injury of skin   Large right knee infected ulcerated wound.  Patient hadirrigation and debridement 08/31 and 9/5.  Skin grafting on 9/7. continue antibiotic therapy with IV ceftriaxone, and vancomycin (8/31-  ) (received dose of cefepime, vanc, and flagyl on 8/30)  Blood cultures have remained with no growth. Continue pain control. Increased WBC today, but  otherwise stable. Patient afebrile.  Discussed with Dr. Sharol Given yesterday who notes plan for abx until day of discharge and then d/c abx.  PT rec home with [facility].   Hypertension Blood pressure 130 to 150, continue diltiazem.  Will d/c prn hydral and CTM. Continue to hold on ace inh due to hyperkalemia.  Acute post operative anemia on chronic anemia with iron deficiency SP one unit prbc transfused.  Likely related to multiple surgical procedures.  Repeat H/H, transfuse if <7.  Has required transfusion once before during this admission. Iron panel.   Hypothyroidism Continue withlevothyroxine, per home regimen.   CKD stage 3 Patient tolerating po well, off IV fluids, will follow on renal panel in am, avoid hypotension or nephrotoxic medications.   Pressure ulcer: stage 1 to R heel, wound c/s  Superficial thrombophlebitis of LUE: will get [place].  Continue conservative management with ice.   DVT prophylaxis: lovenox Code Status: full  Family Communication: orthopedics Disposition Plan: pending PT eval   Consultants:   orthopedics  Procedures: (Don't include imaging studies which can be auto populated. Include things that cannot be auto populated i.e. Echo, Carotid and venous dopplers, Foley, Bipap, HD, tubes/drains, wound vac, central lines etc)  I&D 8/31 and 9/5  Skin graft 9/7  Antimicrobials: (specify start and planned stop date. Auto populated tables are space occupying and do not give end dates)  Ceftriaxone/vancomycin    Subjective: Concerned about her LUE.  Notes swelling at site of IV since aroudn surgery.   Objective: Vitals:   07/25/17 0852 07/25/17 1300 07/25/17 2010 07/26/17 0615  BP: (!) 185/90 (!) 147/67 (!) 148/57 (!) 152/60  Pulse: 88  86 80  Resp: 18 18    Temp: (!) 97.3 F (36.3  C) 98.2 F (36.8 C) 98.7 F (37.1 C) 99.1 F (37.3 C)  TempSrc:  Oral Oral Oral  SpO2: 96% 95% 98% 96%  Weight:      Height:        Intake/Output Summary (Last 24 hours) at  07/26/17 1232 Last data filed at 07/26/17 0900  Gross per 24 hour  Intake          1650.33 ml  Output                0 ml  Net          1650.33 ml   Filed Weights   07/17/17 2046 07/18/17 1016 07/23/17 1117  Weight: 106.6 kg (235 lb) 106.6 kg (235 lb) 106.6 kg (235 lb)    Examination:  General: No acute distress. Cardiovascular: Heart sounds show Eiden Bagot regular rate, and rhythm. No gallops or rubs. No murmurs. No JVD. Lungs: Clear to auscultation bilaterally with good air movement. No rales, rhonchi or wheezes. Abdomen: Soft, nontender, nondistended with normal active bowel sounds. No masses. No hepatosplenomegaly. Neurological: Alert and oriented 3. Cranial nerves II through XII grossly intact. Skin: Warm and dry. No rashes or lesions. Extremities: No clubbing or cyanosis. No edema. Boot on R foot.  Wound vac in place.  Palpable induration near area of bruising around LUE.     Data Reviewed: I have personally reviewed following labs and imaging studies  CBC:  Recent Labs Lab 07/20/17 0423 07/21/17 0252 07/25/17 0408 07/26/17 0314  WBC 10.7* 9.9 15.3* 10.0  NEUTROABS 7.2 6.4  --   --   HGB 7.0* 8.6* 8.5* 7.0*  HCT 23.7* 29.0* 28.7* 23.5*  MCV 89.1 89.5 90.0 89.4  PLT 326 299 381 952   Basic Metabolic Panel:  Recent Labs Lab 07/20/17 0423 07/21/17 0252 07/23/17 0307 07/24/17 0452 07/26/17 0314  NA 136 136 135  --  135  K 4.7 4.4 3.9  --  4.3  CL 107 107 105  --  104  CO2 22 21* 23  --  25  GLUCOSE 136* 97 95  --  95  BUN 31* 30* 18  --  27*  CREATININE 1.38* 1.44* 0.94 1.03* 1.19*  CALCIUM 8.4* 8.4* 8.3*  --  8.6*   GFR: Estimated Creatinine Clearance: 46.5 mL/min (Phi Avans) (by C-G formula based on SCr of 1.19 mg/dL (H)). Liver Function Tests: No results for input(s): AST, ALT, ALKPHOS, BILITOT, PROT, ALBUMIN in the last 168 hours. No results for input(s): LIPASE, AMYLASE in the last 168 hours. No results for input(s): AMMONIA in the last 168 hours. Coagulation  Profile: No results for input(s): INR, PROTIME in the last 168 hours. Cardiac Enzymes: No results for input(s): CKTOTAL, CKMB, CKMBINDEX, TROPONINI in the last 168 hours. BNP (last 3 results) No results for input(s): PROBNP in the last 8760 hours. HbA1C: No results for input(s): HGBA1C in the last 72 hours. CBG: No results for input(s): GLUCAP in the last 168 hours. Lipid Profile: No results for input(s): CHOL, HDL, LDLCALC, TRIG, CHOLHDL, LDLDIRECT in the last 72 hours. Thyroid Function Tests: No results for input(s): TSH, T4TOTAL, FREET4, T3FREE, THYROIDAB in the last 72 hours. Anemia Panel: No results for input(s): VITAMINB12, FOLATE, FERRITIN, TIBC, IRON, RETICCTPCT in the last 72 hours. Sepsis Labs: No results for input(s): PROCALCITON, LATICACIDVEN in the last 168 hours.  Recent Results (from the past 240 hour(s))  Blood culture (routine x 2)     Status: None   Collection Time: 07/17/17  8:55 PM  Result Value Ref Range Status   Specimen Description BLOOD RIGHT ANTECUBITAL  Final   Special Requests   Final    BOTTLES DRAWN AEROBIC AND ANAEROBIC Blood Culture adequate volume   Culture   Final    NO GROWTH 5 DAYS Performed at [facility], [address] [place], [place] [address]    Report Status 07/23/2017 FINAL  Final  Blood culture (routine x 2)     Status: None   Collection Time: 07/17/17  8:55 PM  Result Value Ref Range Status   Specimen Description BLOOD BLOOD LEFT FOREARM  Final   Special Requests IN PEDIATRIC BOTTLE Blood Culture adequate volume  Final   Culture   Final    NO GROWTH 5 DAYS Performed at [facility], [place] [address] [place], [place] [address]    Report Status 07/23/2017 FINAL  Final  MRSA PCR Screening     Status: None   Collection Time: 07/18/17  4:12 AM  Result Value Ref Range Status   MRSA by PCR NEGATIVE NEGATIVE Final    Comment:        The GeneXpert MRSA Assay (FDA approved for NASAL specimens only), is one component of  Zaydan Papesh comprehensive MRSA colonization surveillance program. It is not intended to diagnose MRSA infection nor to guide or monitor treatment for MRSA infections.          Radiology Studies: No results found.      Scheduled Meds: . aspirin EC  81 mg Oral Q breakfast  . diltiazem  120 mg Oral Q breakfast  . docusate sodium  100 mg Oral BID  . enoxaparin (LOVENOX) injection  40 mg Subcutaneous Q24H  . famotidine  20 mg Oral Q1500  . ferrous sulfate  325 mg Oral QHS  . levothyroxine  175 mcg Oral QAC breakfast  . magnesium oxide  200 mg Oral Q1500   Continuous Infusions: . sodium chloride 10 mL/hr at 07/25/17 0029  . sodium chloride Stopped (07/24/17 0934)  . cefTRIAXone (ROCEPHIN)  IV Stopped (07/26/17 0659)  . lactated ringers 10 mL/hr at 07/23/17 1120  . methocarbamol (ROBAXIN)  IV    . vancomycin       LOS: 9 days    Time spent: 44minutes    Susane Bey Melven Sartorius, MD [facility] [phone]   If 7PM-7AM, please contact night-coverage [email or web address] Password [facility] 07/26/2017, 12:32 PM

## 2017-07-26 NOTE — Progress Notes (Signed)
Occupational Therapy Treatment Patient Details Name: Shelby Reese MRN: [id number] DOB: [birth year] Today's Date: 07/26/2017    History of present illness Pt is a 82 y/o female admitted secondary to R knee necrotic wound and is s/p I and D 8/31 and 9/5 with wound vac placement. PMH includes CKD, HTN, fibromyalgia, PVD, and L menisectomy.    OT comments  Pt. Progressing well with acute OT.  Able to perform sit/stand multiple times and ambulate to/from b.room.  Able to complete all toileting tasks with min guard a/min a.    Follow Up Recommendations  Home health OT;Supervision/Assistance - 24 hour    Equipment Recommendations  None recommended by OT    Recommendations for Other Services      Precautions / Restrictions Precautions Precautions: Fall Precaution Comments: VAC Required Braces or Orthoses: Knee Immobilizer - Right Knee Immobilizer - Right: On at all times Restrictions RLE Weight Bearing: Weight bearing as tolerated       Mobility Bed Mobility               General bed mobility comments: pt. seated eob at beginning and end of session (refused recliner as it is too uncomfortable)  Transfers Overall transfer level: Needs assistance Equipment used: Rolling walker (2 wheeled) Transfers: Sit to/from [facility] Sit to Stand: Min guard Stand pivot transfers: Min guard            Balance                                           ADL either performed or assessed with clinical judgement   ADL Overall ADL's : Needs assistance/impaired     Grooming: Standing;Supervision/safety;Wash/dry hands               Lower Body Dressing: Minimal assistance;Sit to/from stand;Sitting/lateral leans Lower Body Dressing Details (indicate cue type and reason): assistance to pull underwear of R KI, also for donning R KI Toilet Transfer: Min guard;Ambulation;BSC;RW;Regular Museum/gallery exhibitions officer and Hygiene: Min  guard;Sit to/from stand Toileting - Clothing Manipulation Details (indicate cue type and reason): increased time to complete; Pt completes perihygiene and clothing management with MinGuard      Functional mobility during ADLs: Min guard;Rolling walker       Vision       Perception     Praxis      Cognition Arousal/Alertness: Awake/alert Behavior During Therapy: WFL for tasks assessed/performed Overall Cognitive Status: Within Functional Limits for tasks assessed                                 General Comments: pt particular in attempting to perform tasks her way and somewhat resistant to cues and education; requires verbal cues for attending to task at hand        Exercises     Shoulder Instructions       General Comments  retired English as a second language teacher, Publishing copy, and Therapist, sports.  States she likes "to get to the point".  Wants clear concise explanations. Is very factual, and "logic based".  Husband has early dementia and she is having to care for him more.       Pertinent Vitals/ Pain       Pain Assessment: 0-10 Pain Score: 7  Pain Location: R knee Pain Descriptors / Indicators: Aching;Operative  site guarding Pain Intervention(s): Limited activity within patient's tolerance;Monitored during session;Repositioned;Patient requesting pain meds-RN notified;RN gave pain meds during session  Home Living                                          Prior Functioning/Environment              Frequency  Min 2X/week        Progress Toward Goals  OT Goals(current goals can now be found in the care plan section)  Progress towards OT goals: Progressing toward goals     Plan Discharge plan remains appropriate    Co-evaluation                 AM-PAC PT "6 Clicks" Daily Activity     Outcome Measure   Help from another person eating meals?: None Help from another person taking care of personal grooming?: None Help from another person  toileting, which includes using toliet, bedpan, or urinal?: A Little Help from another person bathing (including washing, rinsing, drying)?: A Lot Help from another person to put on and taking off regular upper body clothing?: None Help from another person to put on and taking off regular lower body clothing?: A Lot 6 Click Score: 19    End of Session Equipment Utilized During Treatment: Gait belt;Rolling walker;Right knee immobilizer  OT Visit Diagnosis: Unsteadiness on feet (R26.81);Other abnormalities of gait and mobility (R26.89);Muscle weakness (generalized) (M62.81);Pain Pain - Right/Left: Right Pain - part of body: Knee   Activity Tolerance Patient tolerated treatment well   Patient Left in bed;with call bell/phone within reach;Other (comment) (m.d. was in room meeting with the pt. at end of session)   Nurse Communication Patient requests pain meds        Time: 5456-2563 OT Time Calculation (min): 29 min  Charges: OT General Charges $OT Visit: 1 Visit OT Treatments $Self Care/Home Management : 23-37 mins   Janice Coffin, COTA/L 07/26/2017, 8:17 AM

## 2017-07-26 NOTE — Progress Notes (Signed)
Subjective: Patient stable.  She is concerned about small area of thrombosis in the left forearm where an IV had to be pulled she is on oral aspirin   Objective: Vital signs in last 24 hours: Temp:  [97.3 F (36.3 C)-99.1 F (37.3 C)] 99.1 F (37.3 C) (09/08 0615) Pulse Rate:  [80-88] 80 (09/08 0615) Resp:  [18] 18 (09/07 1300) BP: (147-185)/(57-90) 152/60 (09/08 0615) SpO2:  [95 %-98 %] 96 % (09/08 0615)  Intake/Output from previous day: 09/07 0701 - 09/08 0700 In: 1040 [P.O.:240; I.V.:700] Out: 10 [Blood:10] Intake/Output this shift: No intake/output data recorded.  Exam:  wound VAC is functional for the right knee.  Left arm is examined.  She has an area of focal pinning size inflammation around the site where the IV had to be removed.  There is no swelling in the arm itself.  No cords palpable in the proximal arm on the left-hand side.  Labs:  Recent Labs  07/25/17 0408 07/26/17 0314  HGB 8.5* 7.0*    Recent Labs  07/25/17 0408 07/26/17 0314  WBC 15.3* 10.0  RBC 3.19* 2.63*  HCT 28.7* 23.5*  PLT 381 306    Recent Labs  07/24/17 0452 07/26/17 0314  NA  --  135  K  --  4.3  CL  --  104  CO2  --  25  BUN  --  27*  CREATININE 1.03* 1.19*  GLUCOSE  --  95  CALCIUM  --  8.6*   No results for input(s): LABPT, INR in the last 72 hours.  Assessment/Plan: impression is patient's doing marginally well following her right knee surgery.  She would like to have surgical intervention for the left arm but that is not indicated at this time.  I recommended that she discuss this further with Dr. Sharol Given on Monday in regards to the left arm.  Patient would like to have thrombolytics for the left arm but I have discussed this with her and it is not indicated.  Landry Dyke Leda [place] 07/26/2017, 8:46 AM

## 2017-07-27 ENCOUNTER — Inpatient Hospital Stay (HOSPITAL_COMMUNITY): Payer: Medicare Other

## 2017-07-27 DIAGNOSIS — S81001A Unspecified open wound, right knee, initial encounter: Secondary | ICD-10-CM

## 2017-07-27 DIAGNOSIS — S81801A Unspecified open wound, right lower leg, initial encounter: Secondary | ICD-10-CM

## 2017-07-27 DIAGNOSIS — L89899 Pressure ulcer of other site, unspecified stage: Secondary | ICD-10-CM

## 2017-07-27 DIAGNOSIS — S91001A Unspecified open wound, right ankle, initial encounter: Secondary | ICD-10-CM

## 2017-07-27 LAB — BASIC METABOLIC PANEL
ANION GAP: 7 (ref 5–15)
BUN: 20 mg/dL (ref 6–20)
CALCIUM: 8.6 mg/dL — AB (ref 8.9–10.3)
CO2: 25 mmol/L (ref 22–32)
Chloride: 106 mmol/L (ref 101–111)
Creatinine, Ser: 1.08 mg/dL — ABNORMAL HIGH (ref 0.44–1.00)
GFR calc Af Amer: 55 mL/min — ABNORMAL LOW (ref >60)
GFR, EST NON AFRICAN AMERICAN: 48 mL/min — AB (ref >60)
GLUCOSE: 99 mg/dL (ref 65–99)
Potassium: 4.1 mmol/L (ref 3.5–5.1)
Sodium: 138 mmol/L (ref 135–145)

## 2017-07-27 LAB — CBC
HEMATOCRIT: 26.6 % — AB (ref 36.0–46.0)
HEMOGLOBIN: 7.8 g/dL — AB (ref 12.0–15.0)
MCH: 26.4 pg (ref 26.0–34.0)
MCHC: 29.3 g/dL — AB (ref 30.0–36.0)
MCV: 90.2 fL (ref 78.0–100.0)
Platelets: 267 10*3/uL (ref 150–400)
RBC: 2.95 MIL/uL — ABNORMAL LOW (ref 3.87–5.11)
RDW: 14.9 % (ref 11.5–15.5)
WBC: 8.7 10*3/uL (ref 4.0–10.5)

## 2017-07-27 MED ORDER — SENNA 8.6 MG PO TABS: 1.0000 | ORAL_TABLET | Freq: Every evening | ORAL | Status: DC | PRN

## 2017-07-27 MED ORDER — BENAZEPRIL HCL 20 MG PO TABS
40.0000 mg | ORAL_TABLET | Freq: Every day | ORAL | Status: DC
  Administered 2017-07-28 – 2017-07-29 (×2): 40 mg via ORAL
  Filled 2017-07-27 (×2): qty 2

## 2017-07-27 NOTE — Progress Notes (Addendum)
PROGRESS NOTE    Shelby Reese  NTI:[id number] DOB: 03/23/1935 DOA: 07/17/2017 PCP: Shelby Pretty, MD    Brief Narrative:  82 year old female who presents to the leg pain. Patient is known to have hypertension, hypothyroidism and chronic kidney disease. About 2 months prior to hospitalization she developed Shelby Reese right knee traumatic hematoma, spontaneously drain about 3 weeks ago and recently complicated with erythema, pain and worsening edema. She was seen by orthopedics as an outpatient, she was started on antibiotics, with recommendations for surgical debridement. On the initial physical examination blood pressure 165/58, heart rate 97, temperature 98.5,respiratory rate 16, oxygen saturation 98%. Moist mucous membranes, lungs clear to auscultation, heart S1-S2 present and rhythmic, trace lower extremity edema. Right knee with Shelby Reese large ulcerated wound, throughout the right knee, purulent drainage, unstageable. Sodium 134, potassium 4.1, chloride 102, bicarbonate 21, glucose 139, BUN 34, creatinine 1.49, white count 11.4, hemoglobin 9.5, hematocrit 30.9, platelets 373. Urinalysis negative for infection.  Patient admitted to the hospital working diagnosis of large right knee infected ulcerated wound.  Patient s/p irrigation and debridement by orthopedics on 08/30 and 09/5.  Skin graft on 9/7.    Assessment & Plan:   Principal Problem:   Cellulitis Active Problems:   Hypothyroidism, acquired   Anemia, iron deficiency   Essential hypertension   Wound, open, knee, lower leg, or ankle with complication, right, initial encounter   Pressure injury of skin   Large right knee infected ulcerated wound.  Patient hadirrigation and debridement 08/31 and 9/5.  Skin grafting on 9/7. continue antibiotic therapy with IV ceftriaxone, and vancomycin (8/31-  ) (received dose of cefepime, vanc, and flagyl on 8/30)  Blood cultures have remained with no growth. Continue pain control.  Patient afebrile.   Discussed with Dr. Sharol Given who notes plan for abx until day of discharge and then d/c abx.  PT rec home with [facility].   Hypertension Blood pressure 130 to 150, continue diltiazem.  Resume benazepril. Will d/c prn hydral and CTM.   Acute post operative anemia on chronic anemia with iron deficiency SP one unit prbc transfused.  H/H improved from yesterday CTM transfuse <7 Iron  Hypothyroidism Continue withlevothyroxine, per home regimen.   CKD stage 3 Patient tolerating po well, off IV fluids, will follow on renal panel in am, avoid hypotension or nephrotoxic medications.   Pressure ulcer: stage 1 to R heel, wound c/s -> rec foam dressing and prevalon boot.   Superficial Vein Throbosis involving the cephalic and basilic veins:  conservative management .   DVT prophylaxis: lovenox Code Status: full  Family Communication: orthopedics Disposition Plan: pending PT eval   Consultants:   orthopedics  Procedures: (Don't include imaging studies which can be auto populated. Include things that cannot be auto populated i.e. Echo, Carotid and venous dopplers, Foley, Bipap, HD, tubes/drains, wound vac, central lines etc)  I&D 8/31 and 9/5  Skin graft 9/7  Antimicrobials: (specify start and planned stop date. Auto populated tables are space occupying and do not give end dates)  Ceftriaxone/vancomycin    Subjective: Doing ok.  Not interested in lunch today.  Had BM today.  Asking about colace.    Objective: Vitals:   07/26/17 0615 07/26/17 1557 07/26/17 2102 07/27/17 0527  BP: (!) 152/60 (!) 150/56 (!) 150/61 (!) 152/57  Pulse: 80 78 86 81  Resp:      Temp: 99.1 F (37.3 C) 98 F (36.7 C) 98.4 F (36.9 C) 98.8 F (37.1 C)  TempSrc: Oral Oral Oral  Oral  SpO2: 96% 98% 100% 96%  Weight:      Height:        Intake/Output Summary (Last 24 hours) at 07/27/17 1250 Last data filed at 07/27/17 0554  Gross per 24 hour  Intake              290 ml  Output               40 ml  Net               250 ml   Filed Weights   07/17/17 2046 07/18/17 1016 07/23/17 1117  Weight: 106.6 kg (235 lb) 106.6 kg (235 lb) 106.6 kg (235 lb)    Examination:  General: No acute distress. Cardiovascular: Heart sounds show Shelby Reese regular rate, and rhythm. No gallops or rubs. No murmurs. No JVD. Lungs: Clear to auscultation bilaterally with good air movement. No rales, rhonchi or wheezes. Abdomen: Soft, nontender, nondistended with normal active bowel sounds. No masses. No hepatosplenomegaly. Neurological: Alert and oriented 3. Moves all extremities 4 with equal strength. Cranial nerves II through XII grossly intact. Skin: Warm and dry. No rashes or lesions. Extremities: No clubbing or cyanosis. R knee with wound vac.  L arm with superficial thrombophlebitis.   Data Reviewed: I have personally reviewed following labs and imaging studies  CBC:  Recent Labs Lab 07/21/17 0252 07/25/17 0408 07/26/17 0314 07/26/17 1241 07/27/17 0320  WBC 9.9 15.3* 10.0  --  8.7  NEUTROABS 6.4  --   --   --   --   HGB 8.6* 8.5* 7.0* 7.3* 7.8*  HCT 29.0* 28.7* 23.5* 24.7* 26.6*  MCV 89.5 90.0 89.4  --  90.2  PLT 299 381 306  --  254   Basic Metabolic Panel:  Recent Labs Lab 07/21/17 0252 07/23/17 0307 07/24/17 0452 07/26/17 0314 07/27/17 0320  NA 136 135  --  135 138  K 4.4 3.9  --  4.3 4.1  CL 107 105  --  104 106  CO2 21* 23  --  25 25  GLUCOSE 97 95  --  95 99  BUN 30* 18  --  27* 20  CREATININE 1.44* 0.94 1.03* 1.19* 1.08*  CALCIUM 8.4* 8.3*  --  8.6* 8.6*   GFR: Estimated Creatinine Clearance: 51.2 mL/min ([name]) (by C-G formula based on SCr of 1.08 mg/dL (H)). Liver Function Tests: No results for input(s): AST, ALT, ALKPHOS, BILITOT, PROT, ALBUMIN in the last 168 hours. No results for input(s): LIPASE, AMYLASE in the last 168 hours. No results for input(s): AMMONIA in the last 168 hours. Coagulation Profile: No results for input(s): INR, PROTIME in the last 168 hours. Cardiac  Enzymes: No results for input(s): CKTOTAL, CKMB, CKMBINDEX, TROPONINI in the last 168 hours. BNP (last 3 results) No results for input(s): PROBNP in the last 8760 hours. HbA1C: No results for input(s): HGBA1C in the last 72 hours. CBG: No results for input(s): GLUCAP in the last 168 hours. Lipid Profile: No results for input(s): CHOL, HDL, LDLCALC, TRIG, CHOLHDL, LDLDIRECT in the last 72 hours. Thyroid Function Tests: No results for input(s): TSH, T4TOTAL, FREET4, T3FREE, THYROIDAB in the last 72 hours. Anemia Panel:  Recent Labs  07/26/17 1241  VITAMINB12 696  FOLATE 20.7  FERRITIN 27  TIBC 307  IRON 18*   Sepsis Labs: No results for input(s): PROCALCITON, LATICACIDVEN in the last 168 hours.  Recent Results (from the past 240 hour(s))  Blood culture (routine x 2)  Status: None   Collection Time: 07/17/17  8:55 PM  Result Value Ref Range Status   Specimen Description BLOOD RIGHT ANTECUBITAL  Final   Special Requests   Final    BOTTLES DRAWN AEROBIC AND ANAEROBIC Blood Culture adequate volume   Culture   Final    NO GROWTH 5 DAYS Performed at [facility], [address] [place], [place] [address]    Report Status 07/23/2017 FINAL  Final  Blood culture (routine x 2)     Status: None   Collection Time: 07/17/17  8:55 PM  Result Value Ref Range Status   Specimen Description BLOOD BLOOD LEFT FOREARM  Final   Special Requests IN PEDIATRIC BOTTLE Blood Culture adequate volume  Final   Culture   Final    NO GROWTH 5 DAYS Performed at [facility], [place] [address], [place], [place] [address]    Report Status 07/23/2017 FINAL  Final  MRSA PCR Screening     Status: None   Collection Time: 07/18/17  4:12 AM  Result Value Ref Range Status   MRSA by PCR NEGATIVE NEGATIVE Final    Comment:        The GeneXpert MRSA Assay (FDA approved for NASAL specimens only), is one component of Farhana Fellows comprehensive MRSA colonization surveillance program. It is not intended to  diagnose MRSA infection nor to guide or monitor treatment for MRSA infections.          Radiology Studies: No results found.      Scheduled Meds: . aspirin EC  81 mg Oral Q breakfast  . diltiazem  120 mg Oral Q breakfast  . docusate sodium  100 mg Oral BID  . enoxaparin (LOVENOX) injection  40 mg Subcutaneous Q24H  . famotidine  20 mg Oral Q1500  . ferrous sulfate  325 mg Oral BID WC  . levothyroxine  175 mcg Oral QAC breakfast  . magnesium oxide  200 mg Oral Q1500   Continuous Infusions: . sodium chloride 10 mL/hr at 07/25/17 0029  . sodium chloride Stopped (07/24/17 0934)  . cefTRIAXone (ROCEPHIN)  IV Stopped (07/27/17 0554)  . lactated ringers 10 mL/hr at 07/23/17 1120  . methocarbamol (ROBAXIN)  IV    . vancomycin Stopped (07/26/17 2255)     LOS: 10 days    Time spent: 67minutes    Spenser Cong Melven Sartorius, MD [facility] [phone]   If 7PM-7AM, please contact night-coverage [email or web address] Password [email or web address] 07/27/2017, 12:50 PM

## 2017-07-27 NOTE — Progress Notes (Signed)
**  Preliminary report by tech**  Left upper extremity venous duplex complete. There is no evidence of deep vein thrombosis involving the left upper extremity. There is evidence of age-indeterminate superficial vein thrombosis involving the cephalic, and basilic veins of the forearm in the left upper extremity. Results were given to the patient's nurse, Mickel Baas.  07/27/17 9:21 AM Shelby Reese RVT

## 2017-07-28 ENCOUNTER — Encounter (HOSPITAL_COMMUNITY): Payer: Self-pay | Admitting: Orthopedic Surgery

## 2017-07-28 NOTE — Progress Notes (Signed)
OT Cancellation Note  Patient Details Name: Shelby Reese MRN: [id number] DOB: [birth year]   Cancelled Treatment:    Reason Eval/Treat Not Completed: Other (comment); Pt declined OT tx session, stating she has been up twice this morning and preferring to rest at this time. Will check back as schedule permits.   Lou Cal, OT Pager [phone] 07/28/2017   [name] 07/28/2017, 9:31 AM

## 2017-07-28 NOTE — Progress Notes (Signed)
Pt refusing new IV stating, "it is not necessary for me to be on iv antibiotics. I can take them by mouth." Dr. Florene Glen made aware. OK with MD, IV antibiotics discontinued.

## 2017-07-28 NOTE — Progress Notes (Signed)
   07/28/17 1432  Acute Rehab PT Goals  PT Goal Formulation With patient  Time For Goal Achievement 08/11/17  Potential to Achieve Goals Good   Updating goals  Leighton Ruff, PT, DPT  Acute Rehabilitation Services  Pager: [phone]

## 2017-07-28 NOTE — Progress Notes (Signed)
PROGRESS NOTE    Shelby Reese  AYT:[id number] DOB: 1935-04-06 DOA: 07/17/2017 PCP: [name], MD    Brief Narrative:  82 year old female who presents to the leg pain. Patient is known to have hypertension, hypothyroidism and chronic kidney disease. About 2 months prior to hospitalization she developed a right knee traumatic hematoma, spontaneously drain about 3 weeks ago and recently complicated with erythema, pain and worsening edema. She was seen by orthopedics as an outpatient, she was started on antibiotics, with recommendations for surgical debridement. On the initial physical examination blood pressure 165/58, heart rate 97, temperature 98.5,respiratory rate 16, oxygen saturation 98%. Moist mucous membranes, lungs clear to auscultation, heart S1-S2 present and rhythmic, trace lower extremity edema. Right knee with a large ulcerated wound, throughout the right knee, purulent drainage, unstageable. Sodium 134, potassium 4.1, chloride 102, bicarbonate 21, glucose 139, BUN 34, creatinine 1.49, white count 11.4, hemoglobin 9.5, hematocrit 30.9, platelets 373. Urinalysis negative for infection.  Patient admitted to the hospital working diagnosis of large right knee infected ulcerated wound.  Patient s/p irrigation and debridement by orthopedics on 08/30 and 09/5.  Skin graft on 9/7.    Assessment & Plan:   Principal Problem:   Cellulitis Active Problems:   Hypothyroidism, acquired   Anemia, iron deficiency   Essential hypertension   Wound, open, knee, lower leg, or ankle with complication, right, initial encounter   Pressure injury of skin   Large right knee infected ulcerated wound.  Patient hadirrigation and debridement 08/31 and 9/5.  Skin grafting on 9/7. continue antibiotic therapy with IV ceftriaxone, and vancomycin (8/31-9/10) (received dose of cefepime, vanc, and flagyl on 8/30)  Blood cultures have remained with no growth. Continue pain control.  Patient afebrile.   Discussed with Dr. Sharol Given who notes plan for abx until day of discharge and then d/c abx.   Plan for home with hospital bed, wound vac with [facility] nurse for dressing changes, PRAFO boot for deep tissue injury to RLE  Hypertension Blood pressure 130 to 150, continue diltiazem.  Resume benazepril. Will d/c prn hydral and CTM.   Acute post operative anemia on chronic anemia with iron deficiency SP one unit prbc transfused.  H/H improved from yesterday CTM transfuse <7 Iron  Hypothyroidism Continue withlevothyroxine, per home regimen.   CKD stage 3 Patient tolerating po well, off IV fluids, will follow on renal panel in am, avoid hypotension or nephrotoxic medications.   Pressure ulcer: stage 1 to R heel, wound c/s -> rec foam dressing and prevalon boot.   Superficial Vein Throbosis involving the cephalic and basilic veins:  conservative management .   DVT prophylaxis: lovenox Code Status: full  Family Communication: orthopedics Disposition Plan: pending PT eval   Consultants:   orthopedics  Procedures: (Don't include imaging studies which can be auto populated. Include things that cannot be auto populated i.e. Echo, Carotid and venous dopplers, Foley, Bipap, HD, tubes/drains, wound vac, central lines etc)  I&D 8/31 and 9/5  Skin graft 9/7  Antimicrobials: (specify start and planned stop date. Auto populated tables are space occupying and do not give end dates)  Ceftriaxone/vancomycin    Subjective: "If I say I'm not doing well, can I stay"  Objective: Vitals:   07/27/17 1424 07/27/17 2100 07/28/17 0500 07/28/17 1628  BP: (!) 147/72 (!) 140/48 (!) 152/50 (!) 145/62  Pulse: 81 86 74 79  Resp: 18 18 18 18   Temp: 98.4 F (36.9 C) 98.8 F (37.1 C) 98.5 F (36.9 C) 98.5 F (  36.9 C)  TempSrc: Oral Oral Oral Oral  SpO2: 98% 96% 98% 98%  Weight:      Height:        Intake/Output Summary (Last 24 hours) at 07/28/17 1934 Last data filed at 07/28/17 1800  Gross per 24  hour  Intake              640 ml  Output                0 ml  Net              640 ml   Filed Weights   07/17/17 2046 07/18/17 1016 07/23/17 1117  Weight: 106.6 kg (235 lb) 106.6 kg (235 lb) 106.6 kg (235 lb)    Examination:  General: No acute distress. Cardiovascular: Heart sounds show a regular rate, and rhythm. No gallops or rubs. No murmurs. No JVD. Lungs: Clear to auscultation bilaterally with good air movement. No rales, rhonchi or wheezes. Abdomen: Soft, nontender, nondistended with normal active bowel sounds. No masses. No hepatosplenomegaly. Neurological: Alert and oriented 3. Moves all extremities 4 with equal strength. Cranial nerves II through XII grossly intact. Skin: Warm and dry. No rashes or lesions. Extremities: R knee with wound vac  Data Reviewed: I have personally reviewed following labs and imaging studies  CBC:  Recent Labs Lab 07/25/17 0408 07/26/17 0314 07/26/17 1241 07/27/17 0320  WBC 15.3* 10.0  --  8.7  HGB 8.5* 7.0* 7.3* 7.8*  HCT 28.7* 23.5* 24.7* 26.6*  MCV 90.0 89.4  --  90.2  PLT 381 306  --  606   Basic Metabolic Panel:  Recent Labs Lab 07/23/17 0307 07/24/17 0452 07/26/17 0314 07/27/17 0320  NA 135  --  135 138  K 3.9  --  4.3 4.1  CL 105  --  104 106  CO2 23  --  25 25  GLUCOSE 95  --  95 99  BUN 18  --  27* 20  CREATININE 0.94 1.03* 1.19* 1.08*  CALCIUM 8.3*  --  8.6* 8.6*   GFR: Estimated Creatinine Clearance: 51.2 mL/min (A) (by C-G formula based on SCr of 1.08 mg/dL (H)). Liver Function Tests: No results for input(s): AST, ALT, ALKPHOS, BILITOT, PROT, ALBUMIN in the last 168 hours. No results for input(s): LIPASE, AMYLASE in the last 168 hours. No results for input(s): AMMONIA in the last 168 hours. Coagulation Profile: No results for input(s): INR, PROTIME in the last 168 hours. Cardiac Enzymes: No results for input(s): CKTOTAL, CKMB, CKMBINDEX, TROPONINI in the last 168 hours. BNP (last 3 results) No results  for input(s): PROBNP in the last 8760 hours. HbA1C: No results for input(s): HGBA1C in the last 72 hours. CBG: No results for input(s): GLUCAP in the last 168 hours. Lipid Profile: No results for input(s): CHOL, HDL, LDLCALC, TRIG, CHOLHDL, LDLDIRECT in the last 72 hours. Thyroid Function Tests: No results for input(s): TSH, T4TOTAL, FREET4, T3FREE, THYROIDAB in the last 72 hours. Anemia Panel:  Recent Labs  07/26/17 1241  VITAMINB12 696  FOLATE 20.7  FERRITIN 27  TIBC 307  IRON 18*   Sepsis Labs: No results for input(s): PROCALCITON, LATICACIDVEN in the last 168 hours.  No results found for this or any previous visit (from the past 240 hour(s)).       Radiology Studies: No results found.      Scheduled Meds: . aspirin EC  81 mg Oral Q breakfast  . benazepril  40 mg Oral Q  breakfast  . diltiazem  120 mg Oral Q breakfast  . docusate sodium  100 mg Oral BID  . enoxaparin (LOVENOX) injection  40 mg Subcutaneous Q24H  . famotidine  20 mg Oral Q1500  . ferrous sulfate  325 mg Oral BID WC  . levothyroxine  175 mcg Oral QAC breakfast  . magnesium oxide  200 mg Oral Q1500   Continuous Infusions: . sodium chloride 10 mL/hr at 07/25/17 0029  . sodium chloride 10 mL/hr at 07/27/17 2235  . lactated ringers 10 mL/hr at 07/23/17 1120  . methocarbamol (ROBAXIN)  IV       LOS: 11 days    Time spent: 50minutes    Fayrene Helper, MD [facility] [phone]   If 7PM-7AM, please contact night-coverage [email or web address] Password [email or web address] 07/28/2017, 7:34 PM

## 2017-07-28 NOTE — Progress Notes (Signed)
Physical Therapy Treatment Patient Details Name: Shelby Reese MRN: [id number] DOB: 06/30/1935 Today's Date: 07/28/2017    History of Present Illness Pt is a 81 y/o female admitted secondary to R knee necrotic wound and is s/p I and D 8/31 and 9/5 with wound vac placement. PMH includes CKD, HTN, fibromyalgia, PVD, and L menisectomy.     PT Comments    Pt performed increased gait with improved gait speed during session.  Pt remains to use bariatric RW during session and will need a bari RW at home upon d/c.  Pt has met stair goal and PT informed of expired goals and knows to extend them based on continued hospitalization.    Follow Up Recommendations  DC plan and follow up therapy as arranged by surgeon;Supervision/Assistance - 24 hour     Equipment Recommendations  3in1 (PT);Rolling walker with 5" wheels (bari RW)    Recommendations for Other Services       Precautions / Restrictions Precautions Precautions: Fall Precaution Comments: VAC Required Braces or Orthoses: Other Brace/Splint (R prafo boot due to R heel blister.  ) Knee Immobilizer - Right: On at all times Restrictions Weight Bearing Restrictions: Yes RLE Weight Bearing: Weight bearing as tolerated    Mobility  Bed Mobility Overal bed mobility: Needs Assistance Bed Mobility: Sit to Supine       Sit to supine: Min assist   General bed mobility comments: Pt remains to require assistance for lifting RLE into bed against gravity.  VCs for scooting to HOB.    Transfers Overall transfer level: Needs assistance Equipment used: Rolling walker (2 wheeled) Transfers: Sit to/from Stand;Stand Pivot Transfers Sit to Stand: Supervision Stand pivot transfers: Supervision       General transfer comment: Pt remains to require VCs for hand placement to push from bed vs. pulling on RW.  Pt remains particular to push through RW after repeated education that this is not safe practice.    Ambulation/Gait Ambulation/Gait  assistance: Supervision Ambulation Distance (Feet): 300 Feet Assistive device: Rolling walker (2 wheeled) Gait Pattern/deviations: Step-through pattern;Trunk flexed Gait velocity: Decreased Gait velocity interpretation: Below normal speed for age/gender General Gait Details: cues for posture, position in RW and safety.  Pt with increased gait speed and improved quality of gait with removal of KI.  Good knee flexion in swing phase.     Stairs            Wheelchair Mobility    Modified Rankin (Stroke Patients Only)       Balance Overall balance assessment: Needs assistance   Sitting balance-Leahy Scale: Good       Standing balance-Leahy Scale: Fair                              Cognition Arousal/Alertness: Awake/alert Behavior During Therapy: WFL for tasks assessed/performed Overall Cognitive Status: Within Functional Limits for tasks assessed                                 General Comments: Pt remains particular but able to follow commands during session.        Exercises      General Comments        Pertinent Vitals/Pain Pain Assessment: 0-10 Pain Score: 2  Pain Location: R knee Pain Descriptors / Indicators: Aching;Operative site guarding Pain Intervention(s): Monitored during session;Repositioned    Home Living                        Prior Function            PT Goals (current goals can now be found in the care plan section) Acute Rehab PT Goals Patient Stated Goal: to decrease pain  PT Goal Formulation: (P) With patient Time For Goal Achievement: (P) 08/11/17 Potential to Achieve Goals: Good Progress towards PT goals: Progressing toward goals    Frequency    Min 3X/week      PT Plan Current plan remains appropriate;Frequency needs to be updated    Co-evaluation              AM-PAC PT "6 Clicks" Daily Activity  Outcome Measure  Difficulty turning over in bed (including adjusting bedclothes,  sheets and blankets)?: A Little Difficulty moving from lying on back to sitting on the side of the bed? : Unable Difficulty sitting down on and standing up from a chair with arms (e.g., wheelchair, bedside commode, etc,.)?: A Little Help needed moving to and from a bed to chair (including a wheelchair)?: A Little   Help needed climbing 3-5 steps with a railing? : A Little 6 Click Score: 13    End of Session Equipment Utilized During Treatment: Gait belt (R prafo boot) Activity Tolerance: Patient tolerated treatment well Patient left: in bed;with call bell/phone within reach Nurse Communication: Mobility status PT Visit Diagnosis: Difficulty in walking, not elsewhere classified (R26.2);Other abnormalities of gait and mobility (R26.89) Pain - Right/Left: Right Pain - part of body: Knee     Time: 0300-0325 PT Time Calculation (min) (ACUTE ONLY): 25 min  Charges:  $Gait Training: 8-22 mins $Therapeutic Activity: 8-22 mins                    G Codes:        , PTA pager [phone]     J  07/28/2017, 3:39 PM   

## 2017-07-28 NOTE — Progress Notes (Signed)
Patient ID: Shelby Reese, female   DOB: [birth year], 82 y.o.   MRN: [id number] Patient has no complaints this morning. The wound VAC is functioning well.  Patient may discharge to home from an orthopedic standpoint at this time. Patient does not need further antibiotics.  Patient will need a hospital bed as well as home health nursing for wound VAC dressing changes 3 times a week. Patient will also need to wear the [facility] on the right lower extremity all times due to her superficial heel blister.

## 2017-07-29 NOTE — Care Management (Addendum)
    Durable Medical Equipment        Start     Ordered   07/28/17 1937  DME 3-in-1  Once     07/28/17 1937   07/28/17 1937  For home use only DME Walker rolling  [facility])  Once    Comments:  5 inch wheels  Question:  Patient needs a walker to treat with the following condition  Answer:  Right knee skin infection   07/28/17 1937   07/28/17 0000  DME Hospital bed    Question:  Bed type  Answer:  Semi-electric   07/28/17 1657   07/28/17 0000  DME Other see comment    Comments:  Wound vac   07/28/17 1657   07/28/17 0000  For home use only DME Negative pressure wound device    Question Answer Comment  Frequency of dressing change 3 times per week   Length of need 3 Months   Dressing type Foam   Amount of suction 100 mm/Hg   Pressure application Continuous pressure   Supplies 10 canisters and 15 dressings per month for duration of therapy      07/28/17 1657    Patient requires hospital bed for assistance with positioning.

## 2017-07-29 NOTE — Progress Notes (Signed)
Reviewed AVS with patient. Answered her questions. Pt is waiting on wound vac to arrive before she can go home.  Also waiting on hospital bed to be delivered to her home. Pt is stable and ready for discharge.

## 2017-07-29 NOTE — Discharge Summary (Signed)
Physician Discharge Summary  Shelby Reese FVC:[id number] DOB: Oct 17, 1935 DOA: 07/17/2017  PCP: [name], MD  Admit date: 07/17/2017 Discharge date: 07/29/2017  Time spent: over 30 minutes minutes  Recommendations for Outpatient Follow-up:  1. Follow up outpatient CBC/BMP  2. Follow up with ortho for R knee wound 3. Follow up with PCP for heel deep tissue injury, currently with PRAFO boot 4. Follow up superficial venous thrombosis of L arm, treating conservatively  Discharge Diagnoses:  Principal Problem:   Cellulitis Active Problems:   Hypothyroidism, acquired   Anemia, iron deficiency   Essential hypertension   Wound, open, knee, lower leg, or ankle with complication, right, initial encounter   Pressure injury of skin   Discharge Condition: stable  Diet recommendation: heart healthy  Filed Weights   07/17/17 2046 07/18/17 1016 07/23/17 1117  Weight: 106.6 kg (235 lb) 106.6 kg (235 lb) 106.6 kg (235 lb)    History of present illness:  82 year old female who presents to the leg pain. Patient is known to have hypertension, hypothyroidism and chronic kidney disease. About 2 months prior to hospitalization she developed a right knee traumatic hematoma, spontaneously drain about 3 weeks ago and recently complicated with erythema, pain and worsening edema. She was seen by orthopedics as an outpatient, she was started on antibiotics, with recommendations for surgical debridement. On the initial physical examination blood pressure 165/58, heart rate 97, temperature 98.5,respiratory rate 16, oxygen saturation 98%. Moist mucous membranes, lungs clear to auscultation, heart S1-S2 present and rhythmic, trace lower extremity edema. Right knee with a large ulcerated wound, throughout the right knee, purulent drainage, unstageable. Sodium 134, potassium 4.1, chloride 102, bicarbonate 21, glucose 139, BUN 34, creatinine 1.49, white count 11.4, hemoglobin 9.5, hematocrit 30.9, platelets 373.  Urinalysis negative for infection.  Patient admitted to the hospital working diagnosis of large right knee infected ulcerated wound.  Patient s/p irrigation and debridement by orthopedics on 08/30 and 09/5.  Skin graft on 9/7.   She continued on ceftriaxone and vancomycin until 9/10.    Discharged with wound vac with [place] [facility] nurse for dressing changes and PRAFO boot for deep tissue injury to RLE.  Hospital Course:  Large right knee infected ulcerated wound.  Patient hadirrigation and debridement 08/31 and 9/5.  Skin grafting on 9/7.  IV ceftriaxone, and vancomycin (8/31-9/10) (received dose of cefepime, vanc, and flagyl on 8/30)  Blood cultures have remained with no growth. Continue pain control.  Patient afebrile.  Discussed with Dr. Sharol Given who notes plan for abx until day of discharge and then d/c abx.   Plan for home with hospital bed, wound vac with [facility] nurse for dressing changes, PRAFO boot for deep tissue injury to RLE  Hypertension Blood pressure 130 to 150, continue diltiazem.  Resume benazepril. Will d/c prn hydral and CTM.   Acute post operative anemia on chronic anemia with iron deficiency SP one unit prbc transfused.  H/H improved from yesterday CTM transfuse <7 Iron  Hypothyroidism Continue withlevothyroxine, per home regimen.   CKD stage 3 Patient tolerating po well, off IV fluids, will follow on renal panel in am, avoid hypotension or nephrotoxic medications.   Pressure ulcer: stage 1 to R heel, wound c/s -> rec foam dressing and prevalon boot.   Superficial Vein Throbosis involving the cephalic and basilic veins:  conservative management .    Procedures:  LUE [place] without evidence of DVT.  Superficial vein thrombosis of cephalic, basilic veins of forearm (i.e. Studies not automatically included, echos, thoracentesis, etc;  not x-rays)  I&D x 2, skin grafting  Consultations:  orthopedics  Discharge Exam: Vitals:   07/29/17 0436 07/29/17 0811  BP: (!)  122/58 (!) 170/68  Pulse: 80 75  Resp: 17   Temp: 98.7 F (37.1 C)   SpO2: 99% 95%   Feeling well.  Watching 9/11 memorial on television.  Nervous that things won't be home in time.   General: No acute distress. Cardiovascular: Heart sounds show a regular rate, and rhythm. No gallops or rubs. No murmurs. No JVD. Lungs: Clear to auscultation bilaterally with good air movement. No rales, rhonchi or wheezes. Abdomen: Soft, nontender, nondistended with normal active bowel sounds. No masses. No hepatosplenomegaly. Neurological: Alert and oriented 3. Moves all extremities 4 with equal strength. Cranial nerves II through XII grossly intact. Skin: Warm and dry. No rashes or lesions. Extremities: No clubbing or cyanosis. R knee with wound vac.  Prafo boot on. Psychiatric: Mood and affect are normal. Insight and judgment are appropriate.  Discharge Instructions   Discharge Instructions    Call MD for:  difficulty breathing, headache or visual disturbances    Complete by:  As directed    Call MD for:  persistant dizziness or light-headedness    Complete by:  As directed    Call MD for:  persistant nausea and vomiting    Complete by:  As directed    Call MD for:  redness, tenderness, or signs of infection (pain, swelling, redness, odor or green/yellow discharge around incision site)    Complete by:  As directed    Call MD for:  severe uncontrolled pain    Complete by:  As directed    Call MD for:  temperature >100.4    Complete by:  As directed    DME Hospital bed    Complete by:  As directed    Bed type:  Semi-electric   DME Other see comment    Complete by:  As directed    Wound vac   Diet - low sodium heart healthy    Complete by:  As directed    Discharge instructions    Complete by:  As directed    You were seen for a right knee infection.  You were treated with surgery and antibiotics.  Please follow up with Dr. Sharol Given.  Please follow up with your PCP within 1 week.  You also  have a right heel ulcer.  Please wear your boot to help prevent that from worsening.  This should be followed with your primary doctor.   For home use only DME Negative pressure wound device    Complete by:  As directed    Frequency of dressing change:  3 times per week   Length of need:  3 Months   Dressing type:  Foam   Amount of suction:  100 mm/Hg   Pressure application:  Continuous pressure   Supplies:  10 canisters and 15 dressings per month for duration of therapy   Increase activity slowly    Complete by:  As directed    Increase activity slowly    Complete by:  As directed    Increase activity slowly    Complete by:  As directed    Negative Pressure Wound Therapy - Incisional    Complete by:  As directed    Patient will need home health to change the wound VAC dressing 3 times a week. The wound size is equivalent to a medium KCI wound VAC sponge.  Weight bearing as tolerated    Complete by:  As directed      Current Discharge Medication List    CONTINUE these medications which have NOT CHANGED   Details  acetaminophen (TYLENOL) 500 MG tablet Take 1,000 mg by mouth daily.   Associated Diagnoses: Unspecified deficiency anemia    aspirin EC 81 MG tablet Take 81 mg by mouth daily with breakfast.    benazepril (LOTENSIN) 40 MG tablet Take 40 mg by mouth daily with breakfast.    Associated Diagnoses: Unspecified deficiency anemia    Calcium Carbonate-Vitamin D (CALCIUM 600+D) 600-400 MG-UNIT per tablet Take 1 tablet by mouth daily at 3 pm.    Associated Diagnoses: Unspecified deficiency anemia    cetirizine (ZYRTEC) 10 MG tablet Take 10 mg by mouth at bedtime.    Associated Diagnoses: Unspecified deficiency anemia    diltiazem (TIAZAC) 120 MG 24 hr capsule Take 120 mg by mouth daily with breakfast.    Associated Diagnoses: Unspecified deficiency anemia    diphenhydrAMINE (BENADRYL) 25 mg capsule Take 50 mg by mouth every 6 (six) hours.    Associated Diagnoses:  Unspecified deficiency anemia    famotidine (PEPCID) 40 MG tablet Take 40 mg by mouth daily at 3 pm. 1600    ferrous sulfate 325 (65 FE) MG tablet Take 325 mg by mouth at bedtime.    ibuprofen (ADVIL,MOTRIN) 200 MG tablet Take 400 mg by mouth daily.   Associated Diagnoses: Unspecified deficiency anemia    Magnesium Oxide (MAG-200 PO) Take 200 mg by mouth daily at 3 pm.    Multiple Vitamin (MULTIVITAMIN WITH MINERALS) TABS tablet Take 1 tablet by mouth daily.    NATURAL PSYLLIUM FIBER PO Take 1 capsule by mouth daily.   Associated Diagnoses: Unspecified deficiency anemia    pseudoephedrine (SUDAFED) 30 MG tablet Take 30 mg by mouth every 6 (six) hours.    Associated Diagnoses: Unspecified deficiency anemia    silver sulfADIAZINE (SILVADENE) 1 % cream Apply 1 application topically daily. Qty: 50 g, Refills: 0    SYNTHROID 175 MCG tablet Take 175 mcg by mouth daily before breakfast.     vitamin C (ASCORBIC ACID) 500 MG tablet Take 500 mg by mouth daily.   Associated Diagnoses: Unspecified deficiency anemia    Gauze Pads & Dressings (KERLIX GAUZE ROLL LARGE) MISC 1 Units by Does not apply route 4 (four) times daily. Qty: 30 each, Refills: 1    Gauze Pads & Dressings 4"X4-1/2" PADS 2 Units by Does not apply route 4 (four) times daily. Qty: 60 each, Refills: 2       Allergies  Allergen Reactions  . Bactrim [Sulfamethoxazole-Trimethoprim] Diarrhea and Nausea Only  . Ciprofloxacin Other (See Comments)    tremors  . Diovan [Valsartan] Other (See Comments)    Extreme vertigo  . Food     Orange Juice-upset stomach/diarrhea  . Latex Other (See Comments)    Rash/inflammation due to exposure  . Nitrofuran Derivatives Hives    "Full body rash"  . Penicillins Hives and Swelling    Has patient had a PCN reaction causing immediate rash, facial/tongue/throat swelling, SOB or lightheadedness with hypotension:No--severe irritation at the injection site Has patient had a PCN reaction  causing severe rash involving mucus membranes or skin necrosis:Unknown Has patient had a PCN reaction that required hospitalization:No Has patient had a PCN reaction occurring within the last 10 years:Yes If all of the above answers are "NO", then may proceed with Cephalosporin use.   . Sulfa  Antibiotics Diarrhea and Nausea Only  . Other Rash    Mycins   Follow-up Information    [name], MD Follow up in 1 week(s).   Specialty:  Orthopedic Surgery Contact information: [place] [address] [phone]        [name], MD Follow up.   Specialty:  Internal Medicine Contact information: [address] [place] [address] [phone]            The results of significant diagnostics from this hospitalization (including imaging, microbiology, ancillary and laboratory) are listed below for reference.    Significant Diagnostic Studies: No results found.  Microbiology: No results found for this or any previous visit (from the past 240 hour(s)).   Labs: Basic Metabolic Panel:  Recent Labs Lab 07/23/17 0307 07/24/17 0452 07/26/17 0314 07/27/17 0320  NA 135  --  135 138  K 3.9  --  4.3 4.1  CL 105  --  104 106  CO2 23  --  25 25  GLUCOSE 95  --  95 99  BUN 18  --  27* 20  CREATININE 0.94 1.03* 1.19* 1.08*  CALCIUM 8.3*  --  8.6* 8.6*   Liver Function Tests: No results for input(s): AST, ALT, ALKPHOS, BILITOT, PROT, ALBUMIN in the last 168 hours. No results for input(s): LIPASE, AMYLASE in the last 168 hours. No results for input(s): AMMONIA in the last 168 hours. CBC:  Recent Labs Lab 07/25/17 0408 07/26/17 0314 07/26/17 1241 07/27/17 0320  WBC 15.3* 10.0  --  8.7  HGB 8.5* 7.0* 7.3* 7.8*  HCT 28.7* 23.5* 24.7* 26.6*  MCV 90.0 89.4  --  90.2  PLT 381 306  --  267   Cardiac Enzymes: No results for input(s): CKTOTAL, CKMB, CKMBINDEX, TROPONINI in the last 168 hours. BNP: BNP (last 3 results) No  results for input(s): BNP in the last 8760 hours.  ProBNP (last 3 results) No results for input(s): PROBNP in the last 8760 hours.  CBG: No results for input(s): GLUCAP in the last 168 hours.     Signed:  Fayrene Helper MD.  [facility] 07/29/2017, 1:48 PM

## 2017-07-29 NOTE — Care Management Note (Signed)
Case Management Note  Patient Details  Name: Shelby Reese MRN: [id number] Date of Birth: 01-28-1935  Subjective/Objective:                 Spoke with patient at the bedside, informed her plan to DC today. Patient stated husband will be home around 5:00 to receive DME Hospital Bed, 3/1, RW. Patient would like to coordinate all DME and HH services through [facility]. Notified [place] liaison jermaine of need for DME including wound VAC, and HH RN MWF for VAV changes, and HH PT OT as well. Patient's husband to provide transport home later tonight. Dr Florene Glen updated with discharge planning.    Action/Plan:  Will DC to home w HH through [facility], DME to be delivered home, home VAC to be applied prior to DC.   Expected Discharge Date:  07/20/17               Expected Discharge Plan:  [place]  In-House Referral:     Discharge planning Services  CM Consult  Post Acute Care Choice:  Durable Medical Equipment, Home Health Choice offered to:  Patient  DME Arranged:  3-N-1, Vac, Walker rolling, Hospital bed DME Agency:  [place]:  RN, PT, OT [facility] Agency:  [place]  Status of Service:  Completed, signed off  If discussed at [place] of Stay Meetings, dates discussed:    Additional Comments:  Carles Collet, RN 07/29/2017, 10:30 AM

## 2017-07-29 NOTE — Consult Note (Signed)
WOC follow-up: Called Dr Sharol Given to clarify Vac dressing change orders. He has requested a portable Vac upon discharge and Vac dressing changes 3 times a week to begin with home health assistance.  He does not desire a dressing change while in the hospital and wants Vac dressing to remain intact. Care manager will arrange for Vac approval and delivery. Please re-consult if further assistance is needed.  Thank-you,  Julien Girt MSN, [place], [place], [place], [place]

## 2017-07-29 NOTE — Progress Notes (Signed)
OT Cancellation Note  Patient Details Name: Shelby Reese MRN: [id number] DOB: [birth year]   Cancelled Treatment:    Reason Eval/Treat Not Completed: Patient declined, no reason specified; Pt preparing for discharge this afternoon, requesting to rest until this time. Will follow.  Lou Cal, OT Pager [phone] 07/29/2017   [name] 07/29/2017, 3:54 PM

## 2017-07-31 ENCOUNTER — Telehealth (INDEPENDENT_AMBULATORY_CARE_PROVIDER_SITE_OTHER): Payer: Self-pay | Admitting: *Deleted

## 2017-07-31 ENCOUNTER — Telehealth (INDEPENDENT_AMBULATORY_CARE_PROVIDER_SITE_OTHER): Payer: Self-pay | Admitting: Orthopedic Surgery

## 2017-07-31 DIAGNOSIS — M797 Fibromyalgia: Secondary | ICD-10-CM | POA: Diagnosis not present

## 2017-07-31 DIAGNOSIS — Z7982 Long term (current) use of aspirin: Secondary | ICD-10-CM | POA: Diagnosis not present

## 2017-07-31 DIAGNOSIS — S81001D Unspecified open wound, right knee, subsequent encounter: Secondary | ICD-10-CM | POA: Diagnosis not present

## 2017-07-31 DIAGNOSIS — D631 Anemia in chronic kidney disease: Secondary | ICD-10-CM | POA: Diagnosis not present

## 2017-07-31 DIAGNOSIS — L89612 Pressure ulcer of right heel, stage 2: Secondary | ICD-10-CM | POA: Diagnosis not present

## 2017-07-31 DIAGNOSIS — I82619 Acute embolism and thrombosis of superficial veins of unspecified upper extremity: Secondary | ICD-10-CM | POA: Diagnosis not present

## 2017-07-31 DIAGNOSIS — I872 Venous insufficiency (chronic) (peripheral): Secondary | ICD-10-CM | POA: Diagnosis not present

## 2017-07-31 DIAGNOSIS — I129 Hypertensive chronic kidney disease with stage 1 through stage 4 chronic kidney disease, or unspecified chronic kidney disease: Secondary | ICD-10-CM | POA: Diagnosis not present

## 2017-07-31 DIAGNOSIS — N183 Chronic kidney disease, stage 3 (moderate): Secondary | ICD-10-CM | POA: Diagnosis not present

## 2017-07-31 DIAGNOSIS — M1990 Unspecified osteoarthritis, unspecified site: Secondary | ICD-10-CM | POA: Diagnosis not present

## 2017-07-31 DIAGNOSIS — L97221 Non-pressure chronic ulcer of left calf limited to breakdown of skin: Secondary | ICD-10-CM | POA: Diagnosis not present

## 2017-07-31 DIAGNOSIS — Z9181 History of falling: Secondary | ICD-10-CM | POA: Diagnosis not present

## 2017-07-31 DIAGNOSIS — E039 Hypothyroidism, unspecified: Secondary | ICD-10-CM | POA: Diagnosis not present

## 2017-07-31 DIAGNOSIS — I739 Peripheral vascular disease, unspecified: Secondary | ICD-10-CM | POA: Diagnosis not present

## 2017-07-31 NOTE — Telephone Encounter (Signed)
I called Shelby Reese again and advised allevyn is ok since they dont have mepliex. They will use vaseline gauze for other wounds.

## 2017-07-31 NOTE — Telephone Encounter (Signed)
Received call from Pattie at [facility] needing wound vac orders, with specific orders on the wound, and wants to know if they are to leave the silicon layers on her wound. Also states that she has a large blister on R heel wants to know what you would like to do for it.  Pts BP today is 186-76

## 2017-07-31 NOTE — Telephone Encounter (Signed)
error 

## 2017-07-31 NOTE — Telephone Encounter (Signed)
Dr. Sharol Given aware of hemoglobin and hematocrit levels.

## 2017-07-31 NOTE — Telephone Encounter (Signed)
I called and spoke with Shelby Reese advised that silicone layer to remain intact per Dr. Sharol Given. Have mepilex dressing to left legs wounds and right heel wound. She is to be in [facility] boot for right heel ulcer at all times.

## 2017-07-31 NOTE — Telephone Encounter (Signed)
Shelby Reese with [facility] callled needing verbal orders for WD care. The number to contact Shelby Reese is [phone]

## 2017-08-01 DIAGNOSIS — L89612 Pressure ulcer of right heel, stage 2: Secondary | ICD-10-CM | POA: Diagnosis not present

## 2017-08-01 DIAGNOSIS — S81001D Unspecified open wound, right knee, subsequent encounter: Secondary | ICD-10-CM | POA: Diagnosis not present

## 2017-08-01 DIAGNOSIS — I739 Peripheral vascular disease, unspecified: Secondary | ICD-10-CM | POA: Diagnosis not present

## 2017-08-01 DIAGNOSIS — I129 Hypertensive chronic kidney disease with stage 1 through stage 4 chronic kidney disease, or unspecified chronic kidney disease: Secondary | ICD-10-CM | POA: Diagnosis not present

## 2017-08-01 DIAGNOSIS — L97221 Non-pressure chronic ulcer of left calf limited to breakdown of skin: Secondary | ICD-10-CM | POA: Diagnosis not present

## 2017-08-01 DIAGNOSIS — I872 Venous insufficiency (chronic) (peripheral): Secondary | ICD-10-CM | POA: Diagnosis not present

## 2017-08-02 DIAGNOSIS — S81001D Unspecified open wound, right knee, subsequent encounter: Secondary | ICD-10-CM | POA: Diagnosis not present

## 2017-08-02 DIAGNOSIS — L89612 Pressure ulcer of right heel, stage 2: Secondary | ICD-10-CM | POA: Diagnosis not present

## 2017-08-02 DIAGNOSIS — L97221 Non-pressure chronic ulcer of left calf limited to breakdown of skin: Secondary | ICD-10-CM | POA: Diagnosis not present

## 2017-08-02 DIAGNOSIS — I129 Hypertensive chronic kidney disease with stage 1 through stage 4 chronic kidney disease, or unspecified chronic kidney disease: Secondary | ICD-10-CM | POA: Diagnosis not present

## 2017-08-02 DIAGNOSIS — I739 Peripheral vascular disease, unspecified: Secondary | ICD-10-CM | POA: Diagnosis not present

## 2017-08-02 DIAGNOSIS — I872 Venous insufficiency (chronic) (peripheral): Secondary | ICD-10-CM | POA: Diagnosis not present

## 2017-08-04 DIAGNOSIS — I872 Venous insufficiency (chronic) (peripheral): Secondary | ICD-10-CM | POA: Diagnosis not present

## 2017-08-04 DIAGNOSIS — I739 Peripheral vascular disease, unspecified: Secondary | ICD-10-CM | POA: Diagnosis not present

## 2017-08-04 DIAGNOSIS — I129 Hypertensive chronic kidney disease with stage 1 through stage 4 chronic kidney disease, or unspecified chronic kidney disease: Secondary | ICD-10-CM | POA: Diagnosis not present

## 2017-08-04 DIAGNOSIS — L97221 Non-pressure chronic ulcer of left calf limited to breakdown of skin: Secondary | ICD-10-CM | POA: Diagnosis not present

## 2017-08-04 DIAGNOSIS — L89612 Pressure ulcer of right heel, stage 2: Secondary | ICD-10-CM | POA: Diagnosis not present

## 2017-08-04 DIAGNOSIS — S81001D Unspecified open wound, right knee, subsequent encounter: Secondary | ICD-10-CM | POA: Diagnosis not present

## 2017-08-06 ENCOUNTER — Telehealth (INDEPENDENT_AMBULATORY_CARE_PROVIDER_SITE_OTHER): Payer: Self-pay | Admitting: Radiology

## 2017-08-06 DIAGNOSIS — L89612 Pressure ulcer of right heel, stage 2: Secondary | ICD-10-CM | POA: Diagnosis not present

## 2017-08-06 DIAGNOSIS — I872 Venous insufficiency (chronic) (peripheral): Secondary | ICD-10-CM | POA: Diagnosis not present

## 2017-08-06 DIAGNOSIS — S81001D Unspecified open wound, right knee, subsequent encounter: Secondary | ICD-10-CM | POA: Diagnosis not present

## 2017-08-06 DIAGNOSIS — I129 Hypertensive chronic kidney disease with stage 1 through stage 4 chronic kidney disease, or unspecified chronic kidney disease: Secondary | ICD-10-CM | POA: Diagnosis not present

## 2017-08-06 DIAGNOSIS — L97221 Non-pressure chronic ulcer of left calf limited to breakdown of skin: Secondary | ICD-10-CM | POA: Diagnosis not present

## 2017-08-06 DIAGNOSIS — I739 Peripheral vascular disease, unspecified: Secondary | ICD-10-CM | POA: Diagnosis not present

## 2017-08-06 NOTE — Telephone Encounter (Signed)
Shelby Reese is a Engineer, petroleum from [facility] about patient. Request letter of medical necessity faxed to [phone] [facility], for additional cannisters. Patient is having heavy serous fluid drain, and has to change cannister on daily basis. Advised that patient exceeded required amount of monthly allotment of cannister which is 10. Letter was written and rx was faxed today. I did call Shelby Reese back to make her aware at [phone].

## 2017-08-07 DIAGNOSIS — I10 Essential (primary) hypertension: Secondary | ICD-10-CM | POA: Diagnosis not present

## 2017-08-08 DIAGNOSIS — I872 Venous insufficiency (chronic) (peripheral): Secondary | ICD-10-CM | POA: Diagnosis not present

## 2017-08-08 DIAGNOSIS — I129 Hypertensive chronic kidney disease with stage 1 through stage 4 chronic kidney disease, or unspecified chronic kidney disease: Secondary | ICD-10-CM | POA: Diagnosis not present

## 2017-08-08 DIAGNOSIS — I739 Peripheral vascular disease, unspecified: Secondary | ICD-10-CM | POA: Diagnosis not present

## 2017-08-08 DIAGNOSIS — L97221 Non-pressure chronic ulcer of left calf limited to breakdown of skin: Secondary | ICD-10-CM | POA: Diagnosis not present

## 2017-08-08 DIAGNOSIS — S81001D Unspecified open wound, right knee, subsequent encounter: Secondary | ICD-10-CM | POA: Diagnosis not present

## 2017-08-08 DIAGNOSIS — L89612 Pressure ulcer of right heel, stage 2: Secondary | ICD-10-CM | POA: Diagnosis not present

## 2017-08-11 ENCOUNTER — Ambulatory Visit (INDEPENDENT_AMBULATORY_CARE_PROVIDER_SITE_OTHER): Payer: Medicare Other | Admitting: Orthopedic Surgery

## 2017-08-11 ENCOUNTER — Telehealth (INDEPENDENT_AMBULATORY_CARE_PROVIDER_SITE_OTHER): Payer: Self-pay

## 2017-08-11 DIAGNOSIS — L89611 Pressure ulcer of right heel, stage 1: Secondary | ICD-10-CM

## 2017-08-11 DIAGNOSIS — I872 Venous insufficiency (chronic) (peripheral): Secondary | ICD-10-CM | POA: Diagnosis not present

## 2017-08-11 DIAGNOSIS — L97221 Non-pressure chronic ulcer of left calf limited to breakdown of skin: Secondary | ICD-10-CM | POA: Diagnosis not present

## 2017-08-11 DIAGNOSIS — L97813 Non-pressure chronic ulcer of other part of right lower leg with necrosis of muscle: Secondary | ICD-10-CM

## 2017-08-11 DIAGNOSIS — L89612 Pressure ulcer of right heel, stage 2: Secondary | ICD-10-CM | POA: Diagnosis not present

## 2017-08-11 DIAGNOSIS — I129 Hypertensive chronic kidney disease with stage 1 through stage 4 chronic kidney disease, or unspecified chronic kidney disease: Secondary | ICD-10-CM | POA: Diagnosis not present

## 2017-08-11 DIAGNOSIS — I739 Peripheral vascular disease, unspecified: Secondary | ICD-10-CM | POA: Diagnosis not present

## 2017-08-11 DIAGNOSIS — S81001D Unspecified open wound, right knee, subsequent encounter: Secondary | ICD-10-CM | POA: Diagnosis not present

## 2017-08-11 NOTE — Progress Notes (Signed)
Office Visit Note   Patient: Shelby Reese           Date of Birth: 10-Jul-1935           MRN: [id number] Visit Date: 08/11/2017              Requested by: [name], MD [address] [place], [place] [address] PCP: [name], MD  Chief Complaint  Patient presents with  . Right Knee - Routine Post Op    I&D right knee with wound vac 07/18/17      HPI: Patient presents status post debridement of necrotic ulcer right knee she was discharged on a wound VAC she currently has a generic wound VAC in place. Patient has anemia of chronic disease. Patient also has a decubitus right heel ulcer. She has been given a PRAFO and she is not wearing it today she states she only occasionally wears it. Patient states she's having clear serous drainage. Advanced home care dressing changes 3 times a week.  Assessment & Plan: Visit Diagnoses:  1. Skin ulcer of right knee with necrosis of muscle ([place])   2. Decubitus ulcer of right heel, stage 1     Plan: Recommended the importance of wearing the PRAFO 24 hours a day 7 days a week. Discussed that the ulcer gets deeper she could require a transtibial amputation. Discussed the importance of elevation discussed the importance of compression stockings. Patient states that she will not put on compression stockings due to the difficulty.  Follow-Up Instructions: Return in about 1 week (around 08/18/2017).   Ortho Exam  Patient is alert, oriented, no adenopathy, well-dressed, normal affect, normal respiratory effort. Examination patient has weeping edema from the wound with clear drainage secondary to her venous and lymphatic insufficiency. There is pitting edema in the leg with dermatitis and swelling. The wound is approximate 75% fibrinous exudative tissue. We will remove the Mepitel dressing and have her resume the wound VAC compression dressings. There is no cellulitis no odor no signs of infection.  Imaging: No results found. No  images are attached to the encounter.  Labs: Lab Results  Component Value Date   REPTSTATUS 07/23/2017 FINAL 07/17/2017   REPTSTATUS 07/23/2017 FINAL 07/17/2017   CULT  07/17/2017    NO GROWTH 5 DAYS Performed at [facility], [place] [address], [place], [place] [address]    CULT  07/17/2017    NO GROWTH 5 DAYS Performed at [facility] [address] [place], [place] [address]    LABORGA No Salmonella,Shigella,Campylobacter,Yersinia,or 01/01/2015   LABORGA No E.coli 0157:H7 isolated. 01/01/2015    Orders:  No orders of the defined types were placed in this encounter.  No orders of the defined types were placed in this encounter.    Procedures: No procedures performed  Clinical Data: No additional findings.  ROS:  All other systems negative, except as noted in the HPI. Review of Systems  Objective: Vital Signs: There were no vitals taken for this visit.  Specialty Comments:  No specialty comments available.  PMFS History: Patient Active Problem List   Diagnosis Date Noted  . Pressure injury of skin 07/25/2017  . Wound, open, knee, lower leg, or ankle with complication, right, initial encounter   . Cellulitis 07/17/2017  . Idiopathic chronic venous hypertension of both lower extremities with ulcer and inflammation ([place]) 07/15/2017  . Skin ulcer of right knee with necrosis of muscle ([place]) 07/15/2017  . Atherosclerosis of artery of right  lower extremity ([place]) 07/11/2017  . Essential hypertension 07/11/2017  . Osteoporosis 07/11/2017  . Cataracts, bilateral 07/11/2017  . Hypothyroidism, acquired 12/27/2014  . Anemia, iron deficiency 12/27/2014  . Hiatal hernia 12/27/2014  . Varicose veins of lower extremities with other complications 21/97/5883   Past Medical History:  Diagnosis Date  . Allergy   . Anemia   . CKD (chronic kidney disease), stage II   . Fibromyalgia   . GERD (gastroesophageal reflux disease)   . Hiatal hernia   . Hypertension     . Hypothyroid   . Lymphedema    venous insufficency  . Osteoarthritis   . Peripheral vascular disease ([place])   . Ulcer of knee ([place])    right  . Urinary incontinence   . Varicose veins   . Venous insufficiency     Family History  Problem Relation Age of Onset  . Stroke Mother   . Varicose Veins Mother   . Cancer Father        prostate  . Stroke Sister   . Heart disease Sister   . Varicose Veins Sister   . Stroke Maternal Grandmother   . Varicose Veins Sister     Past Surgical History:  Procedure Laterality Date  . APPLICATION OF WOUND VAC Right 07/18/2017   Procedure: APPLICATION OF WOUND VAC;  Surgeon: [name], MD;  Location: [facility];  Service: Orthopedics;  Laterality: Right;  . CATARACT EXTRACTION W/ INTRAOCULAR LENS  IMPLANT, BILATERAL    . COLONOSCOPY    . I&D EXTREMITY Right 07/18/2017   Procedure: IRRIGATION AND DEBRIDEMENT RIGHT KNEE;  Surgeon: [name], MD;  Location: [facility];  Service: Orthopedics;  Laterality: Right;  . I&D EXTREMITY Right 07/23/2017   Procedure: REPEAT IRRIGATION AND DEBRIDEMENT RIGHT KNEE;  Surgeon: [name], MD;  Location: [place] [facility] [facility];  Service: Orthopedics;  Laterality: Right;  . KNEE ARTHROSCOPY Left    menisectomy  . MULTIPLE TOOTH EXTRACTIONS    . SKIN SPLIT GRAFT Right 07/25/2017   Procedure: Repeat Irrigation and Debridement Right Knee, Split Thickness Skin Graft;  Surgeon: [name], MD;  Location: [place];  Service: Orthopedics;  Laterality: Right;  . TONSILLECTOMY     Social History   Occupational History  .      retired Marine scientist.    Social History Main Topics  . Smoking status: Former Smoker    Packs/day: 0.50    Years: 10.00    Quit date: 11/19/1979  . Smokeless tobacco: Never Used  . Alcohol use 8.4 oz/week    14 Glasses of wine per week     Comment: 2 glasses of wine with dinner  . Drug use: No  . Sexual activity: Not on file

## 2017-08-11 NOTE — Telephone Encounter (Signed)
Alexis with [facility] would like to know if they needed to continue adaptic in her wound bed with her wound vac?  CB# is [phone].  Please advise.  Thank you.

## 2017-08-11 NOTE — Telephone Encounter (Signed)
I called and spoke with Ubaldo Glassing, advised either way is fine. Today they did apply adaptic just in case. Advised this is ok.

## 2017-08-13 DIAGNOSIS — I739 Peripheral vascular disease, unspecified: Secondary | ICD-10-CM | POA: Diagnosis not present

## 2017-08-13 DIAGNOSIS — L97221 Non-pressure chronic ulcer of left calf limited to breakdown of skin: Secondary | ICD-10-CM | POA: Diagnosis not present

## 2017-08-13 DIAGNOSIS — L89612 Pressure ulcer of right heel, stage 2: Secondary | ICD-10-CM | POA: Diagnosis not present

## 2017-08-13 DIAGNOSIS — I872 Venous insufficiency (chronic) (peripheral): Secondary | ICD-10-CM | POA: Diagnosis not present

## 2017-08-13 DIAGNOSIS — I129 Hypertensive chronic kidney disease with stage 1 through stage 4 chronic kidney disease, or unspecified chronic kidney disease: Secondary | ICD-10-CM | POA: Diagnosis not present

## 2017-08-13 DIAGNOSIS — S81001D Unspecified open wound, right knee, subsequent encounter: Secondary | ICD-10-CM | POA: Diagnosis not present

## 2017-08-15 DIAGNOSIS — L97221 Non-pressure chronic ulcer of left calf limited to breakdown of skin: Secondary | ICD-10-CM | POA: Diagnosis not present

## 2017-08-15 DIAGNOSIS — L89612 Pressure ulcer of right heel, stage 2: Secondary | ICD-10-CM | POA: Diagnosis not present

## 2017-08-15 DIAGNOSIS — S81001D Unspecified open wound, right knee, subsequent encounter: Secondary | ICD-10-CM | POA: Diagnosis not present

## 2017-08-15 DIAGNOSIS — I872 Venous insufficiency (chronic) (peripheral): Secondary | ICD-10-CM | POA: Diagnosis not present

## 2017-08-15 DIAGNOSIS — I739 Peripheral vascular disease, unspecified: Secondary | ICD-10-CM | POA: Diagnosis not present

## 2017-08-15 DIAGNOSIS — I129 Hypertensive chronic kidney disease with stage 1 through stage 4 chronic kidney disease, or unspecified chronic kidney disease: Secondary | ICD-10-CM | POA: Diagnosis not present

## 2017-08-18 ENCOUNTER — Encounter (INDEPENDENT_AMBULATORY_CARE_PROVIDER_SITE_OTHER): Payer: Self-pay | Admitting: Family

## 2017-08-18 ENCOUNTER — Ambulatory Visit (INDEPENDENT_AMBULATORY_CARE_PROVIDER_SITE_OTHER): Payer: Medicare Other | Admitting: Family

## 2017-08-18 DIAGNOSIS — I70201 Unspecified atherosclerosis of native arteries of extremities, right leg: Secondary | ICD-10-CM

## 2017-08-18 DIAGNOSIS — L97813 Non-pressure chronic ulcer of other part of right lower leg with necrosis of muscle: Secondary | ICD-10-CM

## 2017-08-18 NOTE — Progress Notes (Signed)
Office Visit Note   Patient: Shelby Reese           Date of Birth: September 20, 1935           MRN: [id number] Visit Date: 08/18/2017              Requested by: [name], MD [address] [place], [place] [address] PCP: [name], MD  Chief Complaint  Patient presents with  . Right Knee - Follow-up    I&D right knee wound with split thickness grafting and wound vac.      HPI: Patient presents status post debridement of necrotic ulcer right knee she was discharged on a wound VAC she currently has a Medela wound VAC in place. Patient has anemia of chronic disease. Patient also has a decubitus right heel ulcer. She has been given a PRAFO and she is not wearing it today she states she only occasionally wears it. Does not wear it to bed.  Advanced home care dressing changes 3 times a week.  Assessment & Plan: Visit Diagnoses:  1. Skin ulcer of right knee with necrosis of muscle ([place])   2. Atherosclerosis of artery of right lower extremity (H[facility])     Plan: Wound vac reapplied. Recommended the importance of wearing the PRAFO 24 hours a day 7 days a week. Discussed that the ulcer gets deeper she could require a transtibial amputation. Discussed the importance of elevation discussed the importance of compression stockings. Patient states that she will not put on compression stockings due to the difficulty.  Follow-Up Instructions: Return in about 2 weeks (around 09/01/2017).   Ortho Exam  Patient is alert, oriented, no adenopathy, well-dressed, normal affect, normal respiratory effort. Examination patient has minimal weeping edema from the wound with clear drainage secondary to her venous and lymphatic insufficiency. There is pitting edema in the leg with dermatitis and swelling. No erythema or cellulitis. The wound is approximate 75% fibrinous exudative tissue. Some odor from moisture. no signs of infection.  Imaging: No results found. No images are attached to the  encounter.  Labs: Lab Results  Component Value Date   REPTSTATUS 07/23/2017 FINAL 07/17/2017   REPTSTATUS 07/23/2017 FINAL 07/17/2017   CULT  07/17/2017    NO GROWTH 5 DAYS Performed at [facility], [place] [address] [place], [place] [address]    CULT  07/17/2017    NO GROWTH 5 DAYS Performed at [facility] [address], [place], [place] [address]    LABORGA No Salmonella,Shigella,Campylobacter,Yersinia,or 01/01/2015   LABORGA No E.coli 0157:H7 isolated. 01/01/2015    Orders:  No orders of the defined types were placed in this encounter.  No orders of the defined types were placed in this encounter.    Procedures: No procedures performed  Clinical Data: No additional findings.  ROS:  All other systems negative, except as noted in the HPI. Review of Systems  Constitutional: Negative for chills and fever.  Cardiovascular: Positive for leg swelling.  Skin: Positive for wound.    Objective: Vital Signs: There were no vitals taken for this visit.  Specialty Comments:  No specialty comments available.  PMFS History: Patient Active Problem List   Diagnosis Date Noted  . Pressure injury of skin 07/25/2017  . Wound, open, knee, lower leg, or ankle with complication, right, initial encounter   . Idiopathic chronic venous hypertension of both lower extremities with ulcer and inflammation ([place]) 07/15/2017  . Skin ulcer of right knee with necrosis of  muscle ([place]) 07/15/2017  . Atherosclerosis of artery of right lower extremity ([place]) 07/11/2017  . Essential hypertension 07/11/2017  . Osteoporosis 07/11/2017  . Cataracts, bilateral 07/11/2017  . Hypothyroidism, acquired 12/27/2014  . Anemia, iron deficiency 12/27/2014  . Hiatal hernia 12/27/2014  . Varicose veins of lower extremities with other complications 03/50/0938   Past Medical History:  Diagnosis Date  . Allergy   . Anemia   . CKD (chronic kidney disease), stage II   . Fibromyalgia   . GERD  (gastroesophageal reflux disease)   . Hiatal hernia   . Hypertension   . Hypothyroid   . Lymphedema    venous insufficency  . Osteoarthritis   . Peripheral vascular disease ([place])   . Ulcer of knee ([place])    right  . Urinary incontinence   . Varicose veins   . Venous insufficiency     Family History  Problem Relation Age of Onset  . Stroke Mother   . Varicose Veins Mother   . Cancer Father        prostate  . Stroke Sister   . Heart disease Sister   . Varicose Veins Sister   . Stroke Maternal Grandmother   . Varicose Veins Sister     Past Surgical History:  Procedure Laterality Date  . APPLICATION OF WOUND VAC Right 07/18/2017   Procedure: APPLICATION OF WOUND VAC;  Surgeon: [name], MD;  Location: [facility];  Service: Orthopedics;  Laterality: Right;  . CATARACT EXTRACTION W/ INTRAOCULAR LENS  IMPLANT, BILATERAL    . COLONOSCOPY    . I&D EXTREMITY Right 07/18/2017   Procedure: IRRIGATION AND DEBRIDEMENT RIGHT KNEE;  Surgeon: [name], MD;  Location: [facility];  Service: Orthopedics;  Laterality: Right;  . I&D EXTREMITY Right 07/23/2017   Procedure: REPEAT IRRIGATION AND DEBRIDEMENT RIGHT KNEE;  Surgeon: [name], MD;  Location: [facility];  Service: Orthopedics;  Laterality: Right;  . KNEE ARTHROSCOPY Left    menisectomy  . MULTIPLE TOOTH EXTRACTIONS    . SKIN SPLIT GRAFT Right 07/25/2017   Procedure: Repeat Irrigation and Debridement Right Knee, Split Thickness Skin Graft;  Surgeon: [name], MD;  Location: [facility];  Service: Orthopedics;  Laterality: Right;  . TONSILLECTOMY     Social History   Occupational History  .      retired Marine scientist.    Social History Main Topics  . Smoking status: Former Smoker    Packs/day: 0.50    Years: 10.00    Quit date: 11/19/1979  . Smokeless tobacco: Never Used  . Alcohol use 8.4 oz/week    14 Glasses of wine per week     Comment: 2 glasses of wine with dinner  . Drug use: No  . Sexual activity: Not on file

## 2017-08-19 ENCOUNTER — Encounter (HOSPITAL_COMMUNITY): Payer: Self-pay | Admitting: Orthopedic Surgery

## 2017-08-20 DIAGNOSIS — I739 Peripheral vascular disease, unspecified: Secondary | ICD-10-CM | POA: Diagnosis not present

## 2017-08-20 DIAGNOSIS — L97221 Non-pressure chronic ulcer of left calf limited to breakdown of skin: Secondary | ICD-10-CM | POA: Diagnosis not present

## 2017-08-20 DIAGNOSIS — L89612 Pressure ulcer of right heel, stage 2: Secondary | ICD-10-CM | POA: Diagnosis not present

## 2017-08-20 DIAGNOSIS — I129 Hypertensive chronic kidney disease with stage 1 through stage 4 chronic kidney disease, or unspecified chronic kidney disease: Secondary | ICD-10-CM | POA: Diagnosis not present

## 2017-08-20 DIAGNOSIS — I872 Venous insufficiency (chronic) (peripheral): Secondary | ICD-10-CM | POA: Diagnosis not present

## 2017-08-20 DIAGNOSIS — S81001D Unspecified open wound, right knee, subsequent encounter: Secondary | ICD-10-CM | POA: Diagnosis not present

## 2017-08-22 DIAGNOSIS — I872 Venous insufficiency (chronic) (peripheral): Secondary | ICD-10-CM | POA: Diagnosis not present

## 2017-08-22 DIAGNOSIS — I739 Peripheral vascular disease, unspecified: Secondary | ICD-10-CM | POA: Diagnosis not present

## 2017-08-22 DIAGNOSIS — L97221 Non-pressure chronic ulcer of left calf limited to breakdown of skin: Secondary | ICD-10-CM | POA: Diagnosis not present

## 2017-08-22 DIAGNOSIS — L89612 Pressure ulcer of right heel, stage 2: Secondary | ICD-10-CM | POA: Diagnosis not present

## 2017-08-22 DIAGNOSIS — S81001D Unspecified open wound, right knee, subsequent encounter: Secondary | ICD-10-CM | POA: Diagnosis not present

## 2017-08-22 DIAGNOSIS — I129 Hypertensive chronic kidney disease with stage 1 through stage 4 chronic kidney disease, or unspecified chronic kidney disease: Secondary | ICD-10-CM | POA: Diagnosis not present

## 2017-08-25 ENCOUNTER — Telehealth (INDEPENDENT_AMBULATORY_CARE_PROVIDER_SITE_OTHER): Payer: Self-pay | Admitting: Radiology

## 2017-08-25 DIAGNOSIS — L89612 Pressure ulcer of right heel, stage 2: Secondary | ICD-10-CM | POA: Diagnosis not present

## 2017-08-25 DIAGNOSIS — I872 Venous insufficiency (chronic) (peripheral): Secondary | ICD-10-CM | POA: Diagnosis not present

## 2017-08-25 DIAGNOSIS — S81001D Unspecified open wound, right knee, subsequent encounter: Secondary | ICD-10-CM | POA: Diagnosis not present

## 2017-08-25 DIAGNOSIS — L97221 Non-pressure chronic ulcer of left calf limited to breakdown of skin: Secondary | ICD-10-CM | POA: Diagnosis not present

## 2017-08-25 DIAGNOSIS — I739 Peripheral vascular disease, unspecified: Secondary | ICD-10-CM | POA: Diagnosis not present

## 2017-08-25 DIAGNOSIS — I129 Hypertensive chronic kidney disease with stage 1 through stage 4 chronic kidney disease, or unspecified chronic kidney disease: Secondary | ICD-10-CM | POA: Diagnosis not present

## 2017-08-26 NOTE — Telephone Encounter (Signed)
error 

## 2017-08-27 ENCOUNTER — Ambulatory Visit (INDEPENDENT_AMBULATORY_CARE_PROVIDER_SITE_OTHER): Payer: Medicare Other | Admitting: Family

## 2017-08-27 DIAGNOSIS — L89612 Pressure ulcer of right heel, stage 2: Secondary | ICD-10-CM | POA: Diagnosis not present

## 2017-08-27 DIAGNOSIS — L97813 Non-pressure chronic ulcer of other part of right lower leg with necrosis of muscle: Secondary | ICD-10-CM

## 2017-08-27 DIAGNOSIS — S91001A Unspecified open wound, right ankle, initial encounter: Secondary | ICD-10-CM

## 2017-08-27 DIAGNOSIS — I129 Hypertensive chronic kidney disease with stage 1 through stage 4 chronic kidney disease, or unspecified chronic kidney disease: Secondary | ICD-10-CM | POA: Diagnosis not present

## 2017-08-27 DIAGNOSIS — I872 Venous insufficiency (chronic) (peripheral): Secondary | ICD-10-CM | POA: Diagnosis not present

## 2017-08-27 DIAGNOSIS — S81001D Unspecified open wound, right knee, subsequent encounter: Secondary | ICD-10-CM | POA: Diagnosis not present

## 2017-08-27 DIAGNOSIS — S81801A Unspecified open wound, right lower leg, initial encounter: Secondary | ICD-10-CM

## 2017-08-27 DIAGNOSIS — S81001A Unspecified open wound, right knee, initial encounter: Secondary | ICD-10-CM

## 2017-08-27 DIAGNOSIS — I739 Peripheral vascular disease, unspecified: Secondary | ICD-10-CM | POA: Diagnosis not present

## 2017-08-27 DIAGNOSIS — L97221 Non-pressure chronic ulcer of left calf limited to breakdown of skin: Secondary | ICD-10-CM | POA: Diagnosis not present

## 2017-08-27 NOTE — Progress Notes (Signed)
Office Visit Note   Patient: Shelby Reese           Date of Birth: April 28, 1935           MRN: [id number] Visit Date: 08/27/2017              Requested by: [name], MD [address] [place], [place] [address] PCP: [name], MD  No chief complaint on file.     HPI: Patient presents status post debridement of necrotic ulcer right knee she was discharged on a wound VAC she currently has a Medela wound VAC in place. Patient has anemia of chronic disease. Patient also has a decubitus right heel ulcer. She has been given a PRAFO and she is not wearing it today she states she only occasionally wears it. Does not wear it to bed.  Advanced home care dressing changes 3 times a week.  No new changes.   Assessment & Plan: Visit Diagnoses:  1. Skin ulcer of right knee with necrosis of muscle ([place])   2. Wound, open, knee, lower leg, or ankle with complication, right, initial encounter     Plan: Discussed the importance of wearing the PRAFO 24 hours a day 7 days a week. Home health to reapply vac today. Harvested sutures and staples. Dry dressing applied until vac reapplied.   Follow-Up Instructions: Return in about 2 weeks (around 09/10/2017).   Ortho Exam  Patient is alert, oriented, no adenopathy, well-dressed, normal affect, normal respiratory effort. Examination patient has minimal weeping edema from the wound with clear drainage secondary to her venous and lymphatic insufficiency. There is pitting edema in the leg with dermatitis and swelling. No erythema or cellulitis. The wound is approximate 50% fibrinous exudative tissue, 50% granulation. Some odor from moisture. no signs of infection.  Imaging: No results found. No images are attached to the encounter.  Labs: Lab Results  Component Value Date   REPTSTATUS 07/23/2017 FINAL 07/17/2017   REPTSTATUS 07/23/2017 FINAL 07/17/2017   CULT  07/17/2017    NO GROWTH 5 DAYS Performed at [facility],  [place] [address], [place], [place] [address]    CULT  07/17/2017    NO GROWTH 5 DAYS Performed at [facility] [address] [place], [place] [address]    LABORGA No Salmonella,Shigella,Campylobacter,Yersinia,or 01/01/2015   LABORGA No E.coli 0157:H7 isolated. 01/01/2015    Orders:  No orders of the defined types were placed in this encounter.  No orders of the defined types were placed in this encounter.    Procedures: No procedures performed  Clinical Data: No additional findings.  ROS:  All other systems negative, except as noted in the HPI. Review of Systems  Constitutional: Negative for chills and fever.  Cardiovascular: Positive for leg swelling.  Skin: Positive for wound.    Objective: Vital Signs: There were no vitals taken for this visit.  Specialty Comments:  No specialty comments available.  PMFS History: Patient Active Problem List   Diagnosis Date Noted  . Pressure injury of skin 07/25/2017  . Wound, open, knee, lower leg, or ankle with complication, right, initial encounter   . Idiopathic chronic venous hypertension of both lower extremities with ulcer and inflammation ([place]) 07/15/2017  . Skin ulcer of right knee with necrosis of muscle ([place]) 07/15/2017  . Atherosclerosis of artery of right lower extremity ([place]) 07/11/2017  . Essential hypertension 07/11/2017  . Osteoporosis 07/11/2017  . Cataracts, bilateral 07/11/2017  . Hypothyroidism, acquired 12/27/2014  .  Anemia, iron deficiency 12/27/2014  . Hiatal hernia 12/27/2014  . Varicose veins of lower extremities with other complications 68/06/8109   Past Medical History:  Diagnosis Date  . Allergy   . Anemia   . CKD (chronic kidney disease), stage II   . Fibromyalgia   . GERD (gastroesophageal reflux disease)   . Hiatal hernia   . Hypertension   . Hypothyroid   . Lymphedema    venous insufficency  . Osteoarthritis   . Peripheral vascular disease ([place])   . Ulcer of knee ([place])    right    . Urinary incontinence   . Varicose veins   . Venous insufficiency     Family History  Problem Relation Age of Onset  . Stroke Mother   . Varicose Veins Mother   . Cancer Father        prostate  . Stroke Sister   . Heart disease Sister   . Varicose Veins Sister   . Stroke Maternal Grandmother   . Varicose Veins Sister     Past Surgical History:  Procedure Laterality Date  . APPLICATION OF WOUND VAC Right 07/18/2017   Procedure: APPLICATION OF WOUND VAC;  Surgeon: [name], MD;  Location: [facility];  Service: Orthopedics;  Laterality: Right;  . CATARACT EXTRACTION W/ INTRAOCULAR LENS  IMPLANT, BILATERAL    . COLONOSCOPY    . I&D EXTREMITY Right 07/18/2017   Procedure: IRRIGATION AND DEBRIDEMENT RIGHT KNEE;  Surgeon: [name], MD;  Location: [facility];  Service: Orthopedics;  Laterality: Right;  . I&D EXTREMITY Right 07/23/2017   Procedure: REPEAT IRRIGATION AND DEBRIDEMENT RIGHT KNEE;  Surgeon: [name], MD;  Location: [facility];  Service: Orthopedics;  Laterality: Right;  . KNEE ARTHROSCOPY Left    menisectomy  . MULTIPLE TOOTH EXTRACTIONS    . SKIN SPLIT GRAFT Right 07/25/2017   Procedure: Repeat Irrigation and Debridement Right Knee, Split Thickness Skin Graft;  Surgeon: [name], MD;  Location: [facility];  Service: Orthopedics;  Laterality: Right;  . TONSILLECTOMY     Social History   Occupational History  .      retired Marine scientist.    Social History Main Topics  . Smoking status: Former Smoker    Packs/day: 0.50    Years: 10.00    Quit date: 11/19/1979  . Smokeless tobacco: Never Used  . Alcohol use 8.4 oz/week    14 Glasses of wine per week     Comment: 2 glasses of wine with dinner  . Drug use: No  . Sexual activity: Not on file

## 2017-08-29 ENCOUNTER — Inpatient Hospital Stay (HOSPITAL_COMMUNITY)
Admission: EM | Admit: 2017-08-29 | Discharge: 2017-09-04 | DRG: 872 | Disposition: A | Discharge status: Home Health | Payer: Medicare Other | Attending: Internal Medicine | Admitting: Internal Medicine

## 2017-08-29 ENCOUNTER — Emergency Department (HOSPITAL_COMMUNITY): Payer: Medicare Other

## 2017-08-29 ENCOUNTER — Other Ambulatory Visit: Payer: Self-pay

## 2017-08-29 ENCOUNTER — Encounter (HOSPITAL_COMMUNITY): Payer: Self-pay

## 2017-08-29 DIAGNOSIS — L03115 Cellulitis of right lower limb: Secondary | ICD-10-CM | POA: Diagnosis not present

## 2017-08-29 DIAGNOSIS — Z880 Allergy status to penicillin: Secondary | ICD-10-CM

## 2017-08-29 DIAGNOSIS — M797 Fibromyalgia: Secondary | ICD-10-CM | POA: Diagnosis present

## 2017-08-29 DIAGNOSIS — T7840XA Allergy, unspecified, initial encounter: Secondary | ICD-10-CM | POA: Diagnosis not present

## 2017-08-29 DIAGNOSIS — M609 Myositis, unspecified: Secondary | ICD-10-CM | POA: Diagnosis present

## 2017-08-29 DIAGNOSIS — L270 Generalized skin eruption due to drugs and medicaments taken internally: Secondary | ICD-10-CM

## 2017-08-29 DIAGNOSIS — Z91040 Latex allergy status: Secondary | ICD-10-CM | POA: Diagnosis not present

## 2017-08-29 DIAGNOSIS — N289 Disorder of kidney and ureter, unspecified: Secondary | ICD-10-CM | POA: Diagnosis not present

## 2017-08-29 DIAGNOSIS — Z6839 Body mass index (BMI) 39.0-39.9, adult: Secondary | ICD-10-CM

## 2017-08-29 DIAGNOSIS — I130 Hypertensive heart and chronic kidney disease with heart failure and stage 1 through stage 4 chronic kidney disease, or unspecified chronic kidney disease: Secondary | ICD-10-CM | POA: Diagnosis present

## 2017-08-29 DIAGNOSIS — L97221 Non-pressure chronic ulcer of left calf limited to breakdown of skin: Secondary | ICD-10-CM | POA: Diagnosis not present

## 2017-08-29 DIAGNOSIS — Z87891 Personal history of nicotine dependence: Secondary | ICD-10-CM

## 2017-08-29 DIAGNOSIS — Z888 Allergy status to other drugs, medicaments and biological substances status: Secondary | ICD-10-CM | POA: Diagnosis not present

## 2017-08-29 DIAGNOSIS — Z882 Allergy status to sulfonamides status: Secondary | ICD-10-CM

## 2017-08-29 DIAGNOSIS — N179 Acute kidney failure, unspecified: Secondary | ICD-10-CM | POA: Diagnosis present

## 2017-08-29 DIAGNOSIS — N183 Chronic kidney disease, stage 3 unspecified: Secondary | ICD-10-CM | POA: Diagnosis present

## 2017-08-29 DIAGNOSIS — I878 Other specified disorders of veins: Secondary | ICD-10-CM | POA: Diagnosis present

## 2017-08-29 DIAGNOSIS — Z961 Presence of intraocular lens: Secondary | ICD-10-CM | POA: Diagnosis present

## 2017-08-29 DIAGNOSIS — I739 Peripheral vascular disease, unspecified: Secondary | ICD-10-CM | POA: Diagnosis not present

## 2017-08-29 DIAGNOSIS — E872 Acidosis, unspecified: Secondary | ICD-10-CM | POA: Diagnosis present

## 2017-08-29 DIAGNOSIS — K219 Gastro-esophageal reflux disease without esophagitis: Secondary | ICD-10-CM | POA: Diagnosis present

## 2017-08-29 DIAGNOSIS — I10 Essential (primary) hypertension: Secondary | ICD-10-CM | POA: Diagnosis not present

## 2017-08-29 DIAGNOSIS — E669 Obesity, unspecified: Secondary | ICD-10-CM | POA: Diagnosis present

## 2017-08-29 DIAGNOSIS — E871 Hypo-osmolality and hyponatremia: Secondary | ICD-10-CM | POA: Diagnosis not present

## 2017-08-29 DIAGNOSIS — L259 Unspecified contact dermatitis, unspecified cause: Secondary | ICD-10-CM | POA: Diagnosis not present

## 2017-08-29 DIAGNOSIS — Z79899 Other long term (current) drug therapy: Secondary | ICD-10-CM | POA: Diagnosis not present

## 2017-08-29 DIAGNOSIS — L97812 Non-pressure chronic ulcer of other part of right lower leg with fat layer exposed: Secondary | ICD-10-CM | POA: Diagnosis present

## 2017-08-29 DIAGNOSIS — R040 Epistaxis: Secondary | ICD-10-CM | POA: Diagnosis not present

## 2017-08-29 DIAGNOSIS — Z7982 Long term (current) use of aspirin: Secondary | ICD-10-CM | POA: Diagnosis not present

## 2017-08-29 DIAGNOSIS — L97819 Non-pressure chronic ulcer of other part of right lower leg with unspecified severity: Secondary | ICD-10-CM | POA: Diagnosis not present

## 2017-08-29 DIAGNOSIS — A419 Sepsis, unspecified organism: Secondary | ICD-10-CM | POA: Diagnosis not present

## 2017-08-29 DIAGNOSIS — R21 Rash and other nonspecific skin eruption: Secondary | ICD-10-CM | POA: Diagnosis not present

## 2017-08-29 DIAGNOSIS — E039 Hypothyroidism, unspecified: Secondary | ICD-10-CM | POA: Diagnosis not present

## 2017-08-29 DIAGNOSIS — I872 Venous insufficiency (chronic) (peripheral): Secondary | ICD-10-CM | POA: Diagnosis not present

## 2017-08-29 DIAGNOSIS — I129 Hypertensive chronic kidney disease with stage 1 through stage 4 chronic kidney disease, or unspecified chronic kidney disease: Secondary | ICD-10-CM | POA: Diagnosis not present

## 2017-08-29 DIAGNOSIS — S81001D Unspecified open wound, right knee, subsequent encounter: Secondary | ICD-10-CM | POA: Diagnosis not present

## 2017-08-29 DIAGNOSIS — R509 Fever, unspecified: Secondary | ICD-10-CM | POA: Diagnosis not present

## 2017-08-29 DIAGNOSIS — Z881 Allergy status to other antibiotic agents status: Secondary | ICD-10-CM | POA: Diagnosis not present

## 2017-08-29 DIAGNOSIS — Z9841 Cataract extraction status, right eye: Secondary | ICD-10-CM

## 2017-08-29 DIAGNOSIS — R05 Cough: Secondary | ICD-10-CM | POA: Diagnosis not present

## 2017-08-29 DIAGNOSIS — D649 Anemia, unspecified: Secondary | ICD-10-CM | POA: Diagnosis not present

## 2017-08-29 DIAGNOSIS — L89612 Pressure ulcer of right heel, stage 2: Secondary | ICD-10-CM | POA: Diagnosis not present

## 2017-08-29 DIAGNOSIS — L97919 Non-pressure chronic ulcer of unspecified part of right lower leg with unspecified severity: Secondary | ICD-10-CM | POA: Diagnosis not present

## 2017-08-29 LAB — URINALYSIS, ROUTINE W REFLEX MICROSCOPIC
BACTERIA UA: NONE SEEN
Bilirubin Urine: NEGATIVE
GLUCOSE, UA: NEGATIVE mg/dL
HGB URINE DIPSTICK: NEGATIVE
Ketones, ur: NEGATIVE mg/dL
LEUKOCYTES UA: NEGATIVE
NITRITE: NEGATIVE
Protein, ur: 30 mg/dL — AB
SPECIFIC GRAVITY, URINE: 1.017 (ref 1.005–1.030)
pH: 5 (ref 5.0–8.0)

## 2017-08-29 LAB — COMPREHENSIVE METABOLIC PANEL
ALBUMIN: 3.4 g/dL — AB (ref 3.5–5.0)
ALT: 15 U/L (ref 14–54)
ANION GAP: 11 (ref 5–15)
AST: 24 U/L (ref 15–41)
Alkaline Phosphatase: 65 U/L (ref 38–126)
BILIRUBIN TOTAL: 0.6 mg/dL (ref 0.3–1.2)
BUN: 40 mg/dL — ABNORMAL HIGH (ref 6–20)
CO2: 18 mmol/L — ABNORMAL LOW (ref 22–32)
Calcium: 9.7 mg/dL (ref 8.9–10.3)
Chloride: 102 mmol/L (ref 101–111)
Creatinine, Ser: 1.61 mg/dL — ABNORMAL HIGH (ref 0.44–1.00)
GFR calc Af Amer: 34 mL/min — ABNORMAL LOW (ref >60)
GFR, EST NON AFRICAN AMERICAN: 29 mL/min — AB (ref >60)
Glucose, Bld: 127 mg/dL — ABNORMAL HIGH (ref 65–99)
POTASSIUM: 4.2 mmol/L (ref 3.5–5.1)
Sodium: 131 mmol/L — ABNORMAL LOW (ref 135–145)
TOTAL PROTEIN: 7.2 g/dL (ref 6.5–8.1)

## 2017-08-29 LAB — CBC WITH DIFFERENTIAL/PLATELET
BASOS ABS: 0 10*3/uL (ref 0.0–0.1)
Basophils Relative: 0 %
Eosinophils Absolute: 0 10*3/uL (ref 0.0–0.7)
Eosinophils Relative: 0 %
HEMATOCRIT: 29.8 % — AB (ref 36.0–46.0)
HEMOGLOBIN: 9.1 g/dL — AB (ref 12.0–15.0)
LYMPHS PCT: 2 %
Lymphs Abs: 0.7 10*3/uL (ref 0.7–4.0)
MCH: 25.5 pg — ABNORMAL LOW (ref 26.0–34.0)
MCHC: 30.5 g/dL (ref 30.0–36.0)
MCV: 83.5 fL (ref 78.0–100.0)
MONOS PCT: 3 %
Monocytes Absolute: 1 10*3/uL (ref 0.1–1.0)
Neutro Abs: 31.9 10*3/uL — ABNORMAL HIGH (ref 1.7–7.7)
Neutrophils Relative %: 95 %
Platelets: 302 10*3/uL (ref 150–400)
RBC: 3.57 MIL/uL — AB (ref 3.87–5.11)
RDW: 15.9 % — ABNORMAL HIGH (ref 11.5–15.5)
WBC: 33.6 10*3/uL — AB (ref 4.0–10.5)

## 2017-08-29 LAB — LACTIC ACID, PLASMA
LACTIC ACID, VENOUS: 0.8 mmol/L (ref 0.5–1.9)
Lactic Acid, Venous: 0.7 mmol/L (ref 0.5–1.9)

## 2017-08-29 LAB — I-STAT CG4 LACTIC ACID, ED: LACTIC ACID, VENOUS: 1.35 mmol/L (ref 0.5–1.9)

## 2017-08-29 LAB — INFLUENZA PANEL BY PCR (TYPE A & B)
INFLAPCR: NEGATIVE
Influenza B By PCR: NEGATIVE

## 2017-08-29 LAB — CK: CK TOTAL: 81 U/L (ref 38–234)

## 2017-08-29 LAB — MAGNESIUM: Magnesium: 1.8 mg/dL (ref 1.7–2.4)

## 2017-08-29 LAB — SEDIMENTATION RATE: SED RATE: 92 mm/h — AB (ref 0–22)

## 2017-08-29 MED ORDER — VANCOMYCIN HCL 10 G IV SOLR
1250.0000 mg | INTRAVENOUS | Status: DC
  Administered 2017-08-29 – 2017-09-01 (×4): 1250 mg via INTRAVENOUS
  Filled 2017-08-29 (×5): qty 1250

## 2017-08-29 MED ORDER — LEVOTHYROXINE SODIUM 50 MCG PO TABS
175.0000 ug | ORAL_TABLET | Freq: Every day | ORAL | Status: DC
  Administered 2017-08-30 – 2017-09-04 (×6): 175 ug via ORAL
  Filled 2017-08-29: qty 1
  Filled 2017-08-29: qty 2
  Filled 2017-08-29 (×4): qty 1

## 2017-08-29 MED ORDER — OXYCODONE HCL 5 MG PO TABS
5.0000 mg | ORAL_TABLET | ORAL | Status: DC | PRN
  Filled 2017-08-29: qty 1

## 2017-08-29 MED ORDER — SODIUM CHLORIDE 0.9 % IV SOLN
1.0000 g | Freq: Once | INTRAVENOUS | Status: DC
  Filled 2017-08-29: qty 1

## 2017-08-29 MED ORDER — SODIUM CHLORIDE 0.9 % IV SOLN
INTRAVENOUS | Status: DC
  Administered 2017-08-29 – 2017-08-30 (×4): via INTRAVENOUS

## 2017-08-29 MED ORDER — LORAZEPAM 2 MG/ML IJ SOLN
0.5000 mg | Freq: Once | INTRAMUSCULAR | Status: AC
  Administered 2017-08-29: 0.5 mg via INTRAVENOUS
  Filled 2017-08-29: qty 1

## 2017-08-29 MED ORDER — ACETAMINOPHEN 325 MG PO TABS
650.0000 mg | ORAL_TABLET | Freq: Once | ORAL | Status: AC
Start: 2017-08-29 — End: 2017-08-29
  Administered 2017-08-29: 650 mg via ORAL
  Filled 2017-08-29: qty 2

## 2017-08-29 MED ORDER — FERROUS SULFATE 325 (65 FE) MG PO TABS
325.0000 mg | ORAL_TABLET | Freq: Every day | ORAL | Status: DC
  Administered 2017-08-29 – 2017-09-03 (×6): 325 mg via ORAL
  Filled 2017-08-29 (×6): qty 1

## 2017-08-29 MED ORDER — FAMOTIDINE 20 MG PO TABS
40.0000 mg | ORAL_TABLET | Freq: Every day | ORAL | Status: DC
  Administered 2017-08-29 – 2017-09-04 (×7): 40 mg via ORAL
  Filled 2017-08-29 (×7): qty 2

## 2017-08-29 MED ORDER — ONDANSETRON HCL 4 MG PO TABS: 4.0000 mg | ORAL_TABLET | Freq: Four times a day (QID) | ORAL | Status: DC | PRN

## 2017-08-29 MED ORDER — SODIUM CHLORIDE 0.9 % IV SOLN
2.0000 g | Freq: Once | INTRAVENOUS | Status: AC
  Administered 2017-08-29: 2 g via INTRAVENOUS
  Filled 2017-08-29: qty 2

## 2017-08-29 MED ORDER — MAGNESIUM OXIDE 400 (241.3 MG) MG PO TABS
200.0000 mg | ORAL_TABLET | Freq: Every day | ORAL | Status: DC
  Administered 2017-08-29 – 2017-09-04 (×7): 200 mg via ORAL
  Filled 2017-08-29 (×7): qty 1

## 2017-08-29 MED ORDER — ACETAMINOPHEN 325 MG PO TABS: 650.0000 mg | ORAL_TABLET | Freq: Four times a day (QID) | ORAL | Status: DC | PRN

## 2017-08-29 MED ORDER — VANCOMYCIN HCL IN DEXTROSE 1-5 GM/200ML-% IV SOLN
1000.0000 mg | Freq: Once | INTRAVENOUS | Status: AC
  Administered 2017-08-29: 1000 mg via INTRAVENOUS
  Filled 2017-08-29: qty 200

## 2017-08-29 MED ORDER — ADULT MULTIVITAMIN W/MINERALS CH
1.0000 | ORAL_TABLET | Freq: Every day | ORAL | Status: DC
  Administered 2017-08-29 – 2017-09-04 (×7): 1 via ORAL
  Filled 2017-08-29 (×7): qty 1

## 2017-08-29 MED ORDER — CALCIUM CARBONATE-VITAMIN D 500-200 MG-UNIT PO TABS
1.0000 | ORAL_TABLET | Freq: Every day | ORAL | Status: DC
  Administered 2017-08-29 – 2017-09-04 (×7): 1 via ORAL
  Filled 2017-08-29 (×8): qty 1

## 2017-08-29 MED ORDER — VITAMIN C 500 MG PO TABS
500.0000 mg | ORAL_TABLET | Freq: Every day | ORAL | Status: DC
  Administered 2017-08-29 – 2017-09-04 (×7): 500 mg via ORAL
  Filled 2017-08-29 (×7): qty 1

## 2017-08-29 MED ORDER — VANCOMYCIN HCL 10 G IV SOLR
2000.0000 mg | Freq: Once | INTRAVENOUS | Status: DC
  Filled 2017-08-29: qty 2000

## 2017-08-29 MED ORDER — SODIUM CHLORIDE 0.9% FLUSH
3.0000 mL | Freq: Two times a day (BID) | INTRAVENOUS | Status: DC
  Administered 2017-08-29 – 2017-09-04 (×9): 3 mL via INTRAVENOUS

## 2017-08-29 MED ORDER — ONDANSETRON HCL 4 MG/2ML IJ SOLN: 4.0000 mg | Freq: Four times a day (QID) | INTRAMUSCULAR | Status: DC | PRN

## 2017-08-29 MED ORDER — SODIUM CHLORIDE 0.9 % IV SOLN
2.0000 g | Freq: Two times a day (BID) | INTRAVENOUS | Status: DC
  Administered 2017-08-30: 2 g via INTRAVENOUS
  Filled 2017-08-29 (×2): qty 2

## 2017-08-29 MED ORDER — ENOXAPARIN SODIUM 40 MG/0.4ML ~~LOC~~ SOLN
40.0000 mg | SUBCUTANEOUS | Status: DC
  Administered 2017-08-29 – 2017-09-01 (×4): 40 mg via SUBCUTANEOUS
  Filled 2017-08-29 (×4): qty 0.4

## 2017-08-29 MED ORDER — LORATADINE 10 MG PO TABS
10.0000 mg | ORAL_TABLET | Freq: Every day | ORAL | Status: DC
  Administered 2017-08-30 – 2017-08-31 (×2): 10 mg via ORAL
  Filled 2017-08-29 (×4): qty 1

## 2017-08-29 MED ORDER — TRAMADOL HCL 50 MG PO TABS
50.0000 mg | ORAL_TABLET | Freq: Four times a day (QID) | ORAL | Status: DC | PRN
  Filled 2017-08-29: qty 1

## 2017-08-29 MED ORDER — VANCOMYCIN HCL 10 G IV SOLR
1250.0000 mg | Freq: Once | INTRAVENOUS | Status: DC
  Filled 2017-08-29: qty 1250

## 2017-08-29 MED ORDER — ACETAMINOPHEN 650 MG RE SUPP: 650.0000 mg | Freq: Four times a day (QID) | RECTAL | Status: DC | PRN

## 2017-08-29 MED ORDER — ASPIRIN EC 81 MG PO TBEC
81.0000 mg | DELAYED_RELEASE_TABLET | Freq: Every day | ORAL | Status: DC
  Administered 2017-08-30 – 2017-09-01 (×3): 81 mg via ORAL
  Filled 2017-08-29 (×4): qty 1

## 2017-08-29 NOTE — ED Provider Notes (Signed)
[facility] DEPT Provider Note   CSN: [id number] Arrival date & time: 08/29/17  1236     History   Chief Complaint Chief Complaint  Patient presents with  . Wound Infection    HPI Shelby Reese is a 82 y.o. female.  HPI Patient comes from home she presents with fever onset today. She was sent from her home by her home health nurse came to check on her. Patient also complains of painful right lateral thigh for approximately  One day. Other complaints includenonproductive cough for 1 day. She denies shortness of breath. EMS noted patient's pulse oximetry on room air to be 91%, EMS treated patient with supplement oxygen. No vomitingNo other associated symptoms. Nothing makes symptoms better or worse. Past Medical History:  Diagnosis Date  . Allergy   . Anemia   . CKD (chronic kidney disease), stage II   . Fibromyalgia   . GERD (gastroesophageal reflux disease)   . Hiatal hernia   . Hypertension   . Hypothyroid   . Lymphedema    venous insufficency  . Osteoarthritis   . Peripheral vascular disease ([place])   . Ulcer of knee ([place])    right  . Urinary incontinence   . Varicose veins   . Venous insufficiency     Patient Active Problem List   Diagnosis Date Noted  . Pressure injury of skin 07/25/2017  . Wound, open, knee, lower leg, or ankle with complication, right, initial encounter   . Idiopathic chronic venous hypertension of both lower extremities with ulcer and inflammation ([place]) 07/15/2017  . Skin ulcer of right knee with necrosis of muscle ([place]) 07/15/2017  . Atherosclerosis of artery of right lower extremity ([place]) 07/11/2017  . Essential hypertension 07/11/2017  . Osteoporosis 07/11/2017  . Cataracts, bilateral 07/11/2017  . Hypothyroidism, acquired 12/27/2014  . Anemia, iron deficiency 12/27/2014  . Hiatal hernia 12/27/2014  . Varicose veins of lower extremities with other complications 01/60/1093    Past Surgical History:  Procedure Laterality Date  .  APPLICATION OF WOUND VAC Right 07/18/2017   Procedure: APPLICATION OF WOUND VAC;  Surgeon: [name], MD;  Location: [facility];  Service: Orthopedics;  Laterality: Right;  . CATARACT EXTRACTION W/ INTRAOCULAR LENS  IMPLANT, BILATERAL    . COLONOSCOPY    . I&D EXTREMITY Right 07/18/2017   Procedure: IRRIGATION AND DEBRIDEMENT RIGHT KNEE;  Surgeon: [name], MD;  Location: [facility];  Service: Orthopedics;  Laterality: Right;  . I&D EXTREMITY Right 07/23/2017   Procedure: REPEAT IRRIGATION AND DEBRIDEMENT RIGHT KNEE;  Surgeon: [name], MD;  Location: [facility];  Service: Orthopedics;  Laterality: Right;  . KNEE ARTHROSCOPY Left    menisectomy  . MULTIPLE TOOTH EXTRACTIONS    . SKIN SPLIT GRAFT Right 07/25/2017   Procedure: Repeat Irrigation and Debridement Right Knee, Split Thickness Skin Graft;  Surgeon: [name], MD;  Location: [facility];  Service: Orthopedics;  Laterality: Right;  . TONSILLECTOMY      OB History    No data available       Home Medications    Prior to Admission medications   Medication Sig Start Date End Date Taking? Authorizing Provider  acetaminophen (TYLENOL) 500 MG tablet Take 1,000 mg by mouth daily.   Yes [provider]  aspirin EC 81 MG tablet Take 81 mg by mouth daily with breakfast.   Yes [provider]  benazepril (LOTENSIN) 40 MG tablet Take 40 mg by mouth daily with breakfast.  03/26/13  Yes [provider]  Calcium Carbonate-Vitamin D (CALCIUM 600+D) 600-400 MG-UNIT per tablet Take 1 tablet by mouth daily at 3 pm.    Yes [provider]  cetirizine (ZYRTEC) 10 MG tablet Take 10 mg by mouth at bedtime.    Yes [provider]  diltiazem (TIAZAC) 120 MG 24 hr capsule Take 120 mg by mouth daily with breakfast.  01/29/13  Yes [provider]  diphenhydrAMINE (BENADRYL) 25 mg capsule Take 50 mg by mouth every 6 (six) hours.    Yes [provider]  famotidine (PEPCID) 40 MG tablet Take 40 mg by  mouth daily at 3 pm. 1600   Yes [provider]  ferrous sulfate 325 (65 FE) MG tablet Take 325 mg by mouth at bedtime.   Yes [provider]  Gauze Pads & Dressings (KERLIX GAUZE ROLL LARGE) MISC 1 Units by Does not apply route 4 (four) times daily. 07/14/17  Yes Suzan Slick, NP  Gauze Pads & Dressings 4"X4-1/2" PADS 2 Units by Does not apply route 4 (four) times daily. 07/14/17  Yes Dondra Prader R, NP  ibuprofen (ADVIL,MOTRIN) 200 MG tablet Take 400 mg by mouth daily.   Yes [provider]  Magnesium Oxide (MAG-200 PO) Take 200 mg by mouth daily at 3 pm.   Yes [provider]  Multiple Vitamin (MULTIVITAMIN WITH MINERALS) TABS tablet Take 1 tablet by mouth daily.   Yes [provider]  NATURAL PSYLLIUM FIBER PO Take 1 capsule by mouth daily.   Yes [provider]  pseudoephedrine (SUDAFED) 30 MG tablet Take 30 mg by mouth every 6 (six) hours.    Yes [provider]  SYNTHROID 175 MCG tablet Take 175 mcg by mouth daily before breakfast.  09/20/15  Yes [provider]  vitamin C (ASCORBIC ACID) 500 MG tablet Take 500 mg by mouth daily.   Yes [provider]  silver sulfADIAZINE (SILVADENE) 1 % cream Apply 1 application topically daily. Patient not taking: Reported on 08/29/2017 07/14/17   Suzan Slick, NP    Family History Family History  Problem Relation Age of Onset  . Stroke Mother   . Varicose Veins Mother   . Cancer Father        prostate  . Stroke Sister   . Heart disease Sister   . Varicose Veins Sister   . Stroke Maternal Grandmother   . Varicose Veins Sister     Social History Social History  Substance Use Topics  . Smoking status: Former Smoker    Packs/day: 0.50    Years: 10.00    Quit date: 11/19/1979  . Smokeless tobacco: Never Used  . Alcohol use 8.4 oz/week    14 Glasses of wine per week     Comment: 2 glasses of wine with dinner     Allergies   Bactrim  [sulfamethoxazole-trimethoprim]; Ciprofloxacin; Diovan [valsartan]; Food; Latex; Nitrofuran derivatives; Penicillins; Sulfa antibiotics; and Other   Review of Systems Review of Systems  Constitutional: Positive for fever.  HENT: Negative.   Respiratory: Positive for cough.   Cardiovascular: Negative.   Gastrointestinal: Negative.   Musculoskeletal: Negative.   Skin: Positive for wound.       Healing skin graft to right knee., Chronic wound to right heel and to left calf.  Neurological: Negative.   Psychiatric/Behavioral: Negative.   All other systems reviewed and are negative.    Physical Exam Updated Vital Signs BP (!) 190/62   Pulse Marland Kitchen)  106   Temp (S) (!) 103.2 F (39.6 C) (Oral)   Resp 17   Ht 5\' 4"  (1.626 m)   Wt 104.3 kg (230 lb)   SpO2 99%   BMI 39.48 kg/m   Physical Exam  Constitutional:  Chronically ill-appearing. Alert awake nontoxic  HENT:  Head: Normocephalic and atraumatic.  Eyes: Pupils are equal, round, and reactive to light. Conjunctivae are normal.  Neck: Neck supple. No tracheal deviation present. No thyromegaly present.  Cardiovascular: Regular rhythm.   No murmur heard. Mildly tachycardic  Pulmonary/Chest: Effort normal and breath sounds normal.  Rales at bases bilaterally. No respiratory distress  Abdominal: Soft. Bowel sounds are normal. She exhibits no distension. There is no tenderness.  obese  Musculoskeletal: Normal range of motion. She exhibits no edema or tenderness.  Neurological: She is alert. Coordination normal.  Skin: Skin is warm and dry. No rash noted.  Right lower extremity lateral aspect of the thigh is reddened to approximately the distal two thirds of the thigh. There is a clean appearing cyst healing skin graft overlying the knee which is nontender. There is an approximately 3 cm diameter scabbed lesion overlying the heelwhich is nontender. Left lower extremity without redness swelling or tenderness neurovascularly intact. There  is a healed dime-sized wound at the distal one third of the calf. Bilateral upper extremities without redness or tenderness neurovascularly intact  Psychiatric: She has a normal mood and affect.  Nursing note and vitals reviewed.    ED Treatments / Results  Labs (all labs ordered are listed, but only abnormal results are displayed) Labs Reviewed  CULTURE, BLOOD (ROUTINE X 2)  CULTURE, BLOOD (ROUTINE X 2)  COMPREHENSIVE METABOLIC PANEL  CBC WITH DIFFERENTIAL/PLATELET  URINALYSIS, ROUTINE W REFLEX MICROSCOPIC  I-STAT CG4 LACTIC ACID, ED   Chest x-ray viewed by me EKG  EKG Interpretation  Date/Time:  Friday August 29 2017 13:24:20 EDT Ventricular Rate:  104 PR Interval:    QRS Duration: 103 QT Interval:  361 QTC Calculation: 475 R Axis:   -28 Text Interpretation:  Sinus tachycardia Borderline left axis deviation Borderline repolarization abnormality No old tracing to compare Confirmed by Orlie Dakin [phone]) on 08/29/2017 4:13:50 PM      Results for orders placed or performed during the hospital encounter of 08/29/17  Comprehensive metabolic panel  Result Value Ref Range   Sodium 131 (L) 135 - 145 mmol/L   Potassium 4.2 3.5 - 5.1 mmol/L   Chloride 102 101 - 111 mmol/L   CO2 18 (L) 22 - 32 mmol/L   Glucose, Bld 127 (H) 65 - 99 mg/dL   BUN 40 (H) 6 - 20 mg/dL   Creatinine, Ser 1.61 (H) 0.44 - 1.00 mg/dL   Calcium 9.7 8.9 - 10.3 mg/dL   Total Protein 7.2 6.5 - 8.1 g/dL   Albumin 3.4 (L) 3.5 - 5.0 g/dL   AST 24 15 - 41 U/L   ALT 15 14 - 54 U/L   Alkaline Phosphatase 65 38 - 126 U/L   Total Bilirubin 0.6 0.3 - 1.2 mg/dL   GFR calc non Af Amer 29 (L) >60 mL/min   GFR calc Af Amer 34 (L) >60 mL/min   Anion gap 11 5 - 15  CBC WITH DIFFERENTIAL  Result Value Ref Range   WBC 33.6 (H) 4.0 - 10.5 K/uL   RBC 3.57 (L) 3.87 - 5.11 MIL/uL   Hemoglobin 9.1 (L) 12.0 - 15.0 g/dL   HCT 29.8 (L) 36.0 - 46.0 %  MCV 83.5 78.0 - 100.0 fL   MCH 25.5 (L) 26.0 - 34.0 pg   MCHC 30.5  30.0 - 36.0 g/dL   RDW 15.9 (H) 11.5 - 15.5 %   Platelets 302 150 - 400 K/uL   Neutrophils Relative % 95 %   Lymphocytes Relative 2 %   Monocytes Relative 3 %   Eosinophils Relative 0 %   Basophils Relative 0 %   Neutro Abs 31.9 (H) 1.7 - 7.7 K/uL   Lymphs Abs 0.7 0.7 - 4.0 K/uL   Monocytes Absolute 1.0 0.1 - 1.0 K/uL   Eosinophils Absolute 0.0 0.0 - 0.7 K/uL   Basophils Absolute 0.0 0.0 - 0.1 K/uL   WBC Morphology MILD LEFT SHIFT (1-5% METAS, OCC MYELO, OCC BANDS)   Urinalysis, Routine w reflex microscopic  Result Value Ref Range   Color, Urine YELLOW YELLOW   APPearance CLEAR CLEAR   Specific Gravity, Urine 1.017 1.005 - 1.030   pH 5.0 5.0 - 8.0   Glucose, UA NEGATIVE NEGATIVE mg/dL   Hgb urine dipstick NEGATIVE NEGATIVE   Bilirubin Urine NEGATIVE NEGATIVE   Ketones, ur NEGATIVE NEGATIVE mg/dL   Protein, ur 30 (A) NEGATIVE mg/dL   Nitrite NEGATIVE NEGATIVE   Leukocytes, UA NEGATIVE NEGATIVE   RBC / HPF 0-5 0 - 5 RBC/hpf   WBC, UA 0-5 0 - 5 WBC/hpf   Bacteria, UA NONE SEEN NONE SEEN   Squamous Epithelial / LPF 0-5 (A) NONE SEEN   Mucus PRESENT   I-Stat CG4 Lactic Acid, ED  (not at  [facility])  Result Value Ref Range   Lactic Acid, Venous 1.35 0.5 - 1.9 mmol/L   Dg Chest 2 View  Result Date: 08/29/2017 CLINICAL DATA:  Cough and fever. EXAM: CHEST  2 VIEW COMPARISON:  Chest x-ray 12/25/2014. FINDINGS: Cardiomegaly with mild pulmonary vascular prominence. Mild bilateral interstitial prominence small left pleural effusion. Mild CHF cannot be excluded. Pneumonitis cannot be excluded . IMPRESSION: Findings suggesting mild CHF with mild interstitial edema. Mild pneumonitis cannot be excluded. Electronically Signed   By: Marcello Moores  Register   On: 08/29/2017 14:51   Mr [place]rm[place] Right Wo Contrast  Result Date: 08/29/2017 CLINICAL DATA:  Sepsis. Evaluate for are right distal femur osteomyelitis or fluid collection. EXAM: MRI OF THE RIGHT FEMUR WITHOUT CONTRAST TECHNIQUE: Multiplanar,  multisequence MR imaging of the right femur was performed. No intravenous contrast was administered. COMPARISON:  Right knee x-rays dated May 23, 2017. FINDINGS: Bones/Joint/Cartilage No suspicious marrow signal abnormality. Preserved normal T1 marrow signal. No fracture or dislocation. Moderate bilateral knee joint effusions. Ligaments The knee ligaments are not well evaluated due to field of view. Muscles and Tendons There is a small amount of edema in the distal right vastus medialis muscle. Additional mild edema in the right greater than left mid to distal vastus lateralis muscles, with adjacent fluid along the lateral fascia of the vastus lateralis muscles. The visualized tendons are intact. Soft tissues Deep soft tissue ulceration along the right anteromedial knee extending near the medial patellar retinaculum and proximal MCL. No drainable fluid collection. IMPRESSION: 1. Deep soft tissue ulceration along the right anteromedial knee extending near the medial patellar retinaculum and proximal MCL. No evidence of osteomyelitis. No drainable fluid collection. 2. Small amount of edema within the distal right vastus medialis muscle, nonspecific, but possibly representing infectious myositis given proximity to the right knee wound. 3. Additional mild edema and adjacent fascial fluid involving the right greater than left mid to distal vastus lateralis  muscles, felt unlikely to be infectious in etiology given relative symmetric involvement of both legs. 4. Moderate bilateral knee joint effusions, nonspecific. Again, the involvement of both knee joints suggests these are likely noninfectious, however, given the proximity of the right knee joint to the open right knee wound, consider right knee aspiration to rule out septic arthritis. Electronically Signed   By: Titus Dubin M.D.   On: 08/29/2017 16:12    Radiology No results found.  Procedures Procedures (including critical care time)  Medications Ordered  in ED Medications  acetaminophen (TYLENOL) tablet 650 mg (not administered)  meropenem (MERREM) 1 g in sodium chloride 0.9 % 100 mL IVPB (not administered)  vancomycin (VANCOCIN) 1,250 mg in sodium chloride 0.9 % 250 mL IVPB (not administered)     Initial Impression / Assessment and Plan / ED Course  I have reviewed the triage vital signs and the nursing notes.  Pertinent labs & imaging results that were available during my care of the patient were reviewed by me and considered in my medical decision making (see chart for details).     Code sepsis called based on Sirs criteria of feverand tachycardia, source of infection not immediate the apparent possibilities include cellulitis or soft tissue infection of right lower extremity or respiratory   I consulted Dr. Marlou Sa from orthopedic service who will see patient in the hospital. He requests MRI scan of right thigh. Ordered by me. 4:10 PM patient resting comfortably after treatment with intravenous antibiotics, and IV Ativan and oral Tylenol. Anemia is chronic Patient signed out to Dr.Campos 4:15 PM Final Clinical Impressions(s) / ED Diagnoses  Diagnosis #1 sepsis Final diagnoses:  None  #2 anemia #3 acute on chronic renal insufficiency   New Prescriptions New Prescriptions   No medications on file     Orlie Dakin, MD 08/29/17 1621

## 2017-08-29 NOTE — H&P (Signed)
History and Physical    Shelby Reese AUQ:[id number] DOB: 1935-03-01 DOA: 08/29/2017   PCP: [name], MD   Attending physician: Denton Brick  Patient coming from/Resides with: Private residence  Chief Complaint: Increased redness and drainage from the right knee wound  HPI: Shelby Reese is a 82 y.o. female with medical history significant for peripheral vascular disease and known necrotic ulcer right knee status post debridement and wound VAC treatment followed by Dr. 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 The wound care nurse came by to evaluate the patient's when today documented increased drainage and redness. RN notes that 2 days ago wound did not have same appearance. (Patient was also evaluated in the orthopedic office on 10/10 and documentation did not mention redness in the lower extremity.) EMS was called to the home and her O2 sats were apparently 91% on room air so she was placed on 2 L of oxygen. She was also found to be febrile with an oral temperature of 102.46F. After arrival to the ER patient was found to be tachycardic, hypertensive with elevated systolic blood pressure 638, and oral temperature now 103.5F. Labs revealed significant leukocytosis of 33,600 with neutrophils 95% and neutrophils 31.9%. Her lactic acid was normal. EDP discussed case with on-call orthopedic physician who recommended stat MR of the right extremity. After this was completed orthopedic discussed again with EDP and based on appearance of MRA was felt the patient had acute myositis. Orthopedic team will follow along with the patient during the admission. Her urinalysis was not consistent with UTI. She has had mild cough and chest x-ray suggestive of mild pneumonitis. Patient reports she did receive an influenza vaccine this year. She is mildly confused and somewhat sleepy during my examination noting she did require Ativan to  undergo MRI.  ED Course:  Vital Signs: BP (!) 130/52   Pulse 89   Temp (!) 100.7 F (38.2 C) (Oral)   Resp 18   Ht 5' 4"  (1.626 m)   Wt 104.3 kg (230 lb)   SpO2 94%   BMI 39.48 kg/m  Two-view chest x-ray: As above MR right femur without contrast: No osteomyelitis, no drainable fluid collection, small amount of edema within the distal right vastus medialis muscle nonspecific but could represent infectious myositis given proximity right knee wound, additional mild edema and adjacent fascial fluid involving the right greater than left mid to distal vastus lateralis muscles felt unlikely to be infectious in etiology given symmetric involvement in both legs. Lab data: Sodium 131, potassium 4.2, chloride 102, CO2 18, glucose 127, BUN 40, creatinine 1.61, anion gap 11, white count 33,600 with neutrophils 95% and absolute neutrophils 31.9%, hemoglobin 9.1, platelets 302,000, lactic acid 1.35, urinalysis unremarkable except for 30 of protein, blood cultures obtained in the ER Medications and treatments: Tylenol 650 mg 1, and meropenem 2 g IV 1, vancomycin 1250 mg IV 1, Ativan 0.5 mg IV 1  Review of Systems:  In addition to the HPI above,  No Headache, changes with Vision or hearing, new weakness, tingling, numbness in any extremity, dizziness, dysarthria or word finding difficulty, gait disturbance or imbalance, tremors or seizure activity No problems swallowing food or Liquids, indigestion/reflux, choking or coughing while eating, abdominal pain with or after eating No Chest pain, Cough or Shortness of Breath, palpitations, orthopnea or DOE No Abdominal pain, N/V, melena,hematochezia, dark tarry stools, constipation No dysuria, malodorous urine, hematuria or flank pain No new skin rashes,  lesions, masses or bruises No recent unintentional weight gain or loss No polyuria, polydypsia or polyphagia   Past Medical History:  Diagnosis Date  . Allergy   . Anemia   . CKD (chronic kidney  disease), stage II   . Fibromyalgia   . GERD (gastroesophageal reflux disease)   . Hiatal hernia   . Hypertension   . Hypothyroid   . Lymphedema    venous insufficency  . Osteoarthritis   . Peripheral vascular disease ([place])   . Ulcer of knee ([place])    right  . Urinary incontinence   . Varicose veins   . Venous insufficiency     Past Surgical History:  Procedure Laterality Date  . APPLICATION OF WOUND VAC Right 07/18/2017   Procedure: APPLICATION OF WOUND VAC;  Surgeon: [name], MD;  Location: [place];  Service: Orthopedics;  Laterality: Right;  . CATARACT EXTRACTION W/ INTRAOCULAR LENS  IMPLANT, BILATERAL    . COLONOSCOPY    . I&D EXTREMITY Right 07/18/2017   Procedure: IRRIGATION AND DEBRIDEMENT RIGHT KNEE;  Surgeon: [name], MD;  Location: [place];  Service: Orthopedics;  Laterality: Right;  . I&D EXTREMITY Right 07/23/2017   Procedure: REPEAT IRRIGATION AND DEBRIDEMENT RIGHT KNEE;  Surgeon: [name], MD;  Location: [place];  Service: Orthopedics;  Laterality: Right;  . KNEE ARTHROSCOPY Left    menisectomy  . MULTIPLE TOOTH EXTRACTIONS    . SKIN SPLIT GRAFT Right 07/25/2017   Procedure: Repeat Irrigation and Debridement Right Knee, Split Thickness Skin Graft;  Surgeon: [name], MD;  Location: [place];  Service: Orthopedics;  Laterality: Right;  . TONSILLECTOMY      Social History   Social History  . Marital status: Married    Spouse name: N/A  . Number of children: 0  . Years of education: N/A   Occupational History  .      retired Marine scientist.    Social History Main Topics  . Smoking status: Former Smoker    Packs/day: 0.50    Years: 10.00    Quit date: 11/19/1979  . Smokeless tobacco: Never Used  . Alcohol use 8.4 oz/week    14 Glasses of wine per week     Comment: 2 glasses of wine with dinner  . Drug use: No  . Sexual activity: Not on file   Other Topics Concern  . Not on file   Social History Narrative  . No narrative on file    Mobility:  Rolling walker Work history: Not obtained   Allergies  Allergen Reactions  . Bactrim [Sulfamethoxazole-Trimethoprim] Diarrhea and Nausea Only  . Ciprofloxacin Other (See Comments)    tremors  . Diovan [Valsartan] Other (See Comments)    Extreme vertigo  . Food     Orange Juice-upset stomach/diarrhea  . Latex Other (See Comments)    Rash/inflammation due to exposure  . Nitrofuran Derivatives Hives    "Full body rash"  . Penicillins Hives and Swelling    Has patient had a PCN reaction causing immediate rash, facial/tongue/throat swelling, SOB or lightheadedness with hypotension:No--severe irritation at the injection site Has patient had a PCN reaction causing severe rash involving mucus membranes or skin necrosis:Unknown Has patient had a PCN reaction that required hospitalization:No Has patient had a PCN reaction occurring within the last 10 years:Yes If all of the above answers are "NO", then may proceed with Cephalosporin use.   . Sulfa Antibiotics Diarrhea and Nausea Only  . Other Rash  Mycins    Family History  Problem Relation Age of Onset  . Stroke Mother   . Varicose Veins Mother   . Cancer Father        prostate  . Stroke Sister   . Heart disease Sister   . Varicose Veins Sister   . Stroke Maternal Grandmother   . Varicose Veins Sister      Prior to Admission medications   Medication Sig Start Date End Date Taking? Authorizing Provider  acetaminophen (TYLENOL) 500 MG tablet Take 1,000 mg by mouth daily.   Yes [provider]  aspirin EC 81 MG tablet Take 81 mg by mouth daily with breakfast.   Yes [provider]  benazepril (LOTENSIN) 40 MG tablet Take 40 mg by mouth daily with breakfast.  03/26/13  Yes [provider]  Calcium Carbonate-Vitamin D (CALCIUM 600+D) 600-400 MG-UNIT per tablet Take 1 tablet by mouth daily at 3 pm.    Yes [provider]  cetirizine (ZYRTEC) 10 MG tablet Take 10 mg by mouth at bedtime.    Yes  [provider]  diltiazem (TIAZAC) 120 MG 24 hr capsule Take 120 mg by mouth daily with breakfast.  01/29/13  Yes [provider]  diphenhydrAMINE (BENADRYL) 25 mg capsule Take 50 mg by mouth every 6 (six) hours.    Yes [provider]  famotidine (PEPCID) 40 MG tablet Take 40 mg by mouth daily at 3 pm. 1600   Yes [provider]  ferrous sulfate 325 (65 FE) MG tablet Take 325 mg by mouth at bedtime.   Yes [provider]  Gauze Pads & Dressings (KERLIX GAUZE ROLL LARGE) MISC 1 Units by Does not apply route 4 (four) times daily. 07/14/17  Yes Suzan Slick, NP  Gauze Pads & Dressings 4"X4-1/2" PADS 2 Units by Does not apply route 4 (four) times daily. 07/14/17  Yes Dondra Prader R, NP  ibuprofen (ADVIL,MOTRIN) 200 MG tablet Take 400 mg by mouth daily.   Yes [provider]  Magnesium Oxide (MAG-200 PO) Take 200 mg by mouth daily at 3 pm.   Yes [provider]  Multiple Vitamin (MULTIVITAMIN WITH MINERALS) TABS tablet Take 1 tablet by mouth daily.   Yes [provider]  NATURAL PSYLLIUM FIBER PO Take 1 capsule by mouth daily.   Yes [provider]  pseudoephedrine (SUDAFED) 30 MG tablet Take 30 mg by mouth every 6 (six) hours.    Yes [provider]  SYNTHROID 175 MCG tablet Take 175 mcg by mouth daily before breakfast.  09/20/15  Yes [provider]  vitamin C (ASCORBIC ACID) 500 MG tablet Take 500 mg by mouth daily.   Yes [provider]  silver sulfADIAZINE (SILVADENE) 1 % cream Apply 1 application topically daily. Patient not taking: Reported on 08/29/2017 07/14/17   Suzan Slick, NP    Physical Exam: Vitals:   08/29/17 1402 08/29/17 1430 08/29/17 1611 08/29/17 1645  BP:  (!) 130/51 (!) 122/43 (!) 130/52  Pulse:  96 92 89  Resp:  19 18   Temp: (!) 100.7 F (38.2 C)     TempSrc: Oral     SpO2:  95% 96% 94%  Weight:      Height:          Constitutional: Sleepy status post  receipt of Ativan for MRI Eyes: PERRL, lids and conjunctivae normal ENMT: Mucous membranes are dry. Posterior pharynx clear of any exudate or lesions. Age-appropriate dentition.  Neck: normal, supple, no masses, no thyromegaly Respiratory: clear to auscultation bilaterally, no wheezing, no crackles. Normal respiratory effort. No accessory muscle use.  Cardiovascular: Regular rate and rhythm, no rubs / gallops. Loud grade 3/6 systolic murmur left sternal border second costal space, bilateral nonpitting lower extremity extremity edema. 2+ pedal pulses. No carotid bruits.  Abdomen: no tenderness, no masses palpated. No hepatosplenomegaly. Bowel sounds positive.  Musculoskeletal: no clubbing / cyanosis. No joint deformity upper and lower extremities. Good ROM, no contractures. Normal muscle tone.  Skin: Noted with warmth entire right leg with erythematous changes extending from just above the knee to the foot with slightly more swelling of right lower extremity as compared to left lower extremity. Wound VAC is being removed from right knee. Knee wound with a mix of pain and granular and fibrinous tissue. Neurologic: CN 2-12 grossly intact. Sensation intact, DTR normal. Strength 5/5 x all 4 extremities.  Psychiatric: Drowsy and oriented x 3. At times is mildly confused and having difficulty with history but this is in the setting of IV Ativan. Normal mood.    Labs on Admission: I have personally reviewed following labs and imaging studies  CBC:  Recent Labs Lab 08/29/17 1255  WBC 33.6*  NEUTROABS 31.9*  HGB 9.1*  HCT 29.8*  MCV 83.5  PLT 151   Basic Metabolic Panel:  Recent Labs Lab 08/29/17 1255  NA 131*  K 4.2  CL 102  CO2 18*  GLUCOSE 127*  BUN 40*  CREATININE 1.61*  CALCIUM 9.7   GFR: Estimated Creatinine Clearance: 33.3 mL/min (A) (by C-G formula based on SCr of 1.61 mg/dL (H)). Liver Function Tests:  Recent Labs Lab 08/29/17 1255  AST 24  ALT 15  ALKPHOS 65    BILITOT 0.6  PROT 7.2  ALBUMIN 3.4*   No results for input(s): LIPASE, AMYLASE in the last 168 hours. No results for input(s): AMMONIA in the last 168 hours. Coagulation Profile: No results for input(s): INR, PROTIME in the last 168 hours. Cardiac Enzymes: No results for input(s): CKTOTAL, CKMB, CKMBINDEX, TROPONINI in the last 168 hours. BNP (last 3 results) No results for input(s): PROBNP in the last 8760 hours. HbA1C: No results for input(s): HGBA1C in the last 72 hours. CBG: No results for input(s): GLUCAP in the last 168 hours. Lipid Profile: No results for input(s): CHOL, HDL, LDLCALC, TRIG, CHOLHDL, LDLDIRECT in the last 72 hours. Thyroid Function Tests: No results for input(s): TSH, T4TOTAL, FREET4, T3FREE, THYROIDAB in the last 72 hours. Anemia Panel: No results for input(s): VITAMINB12, FOLATE, FERRITIN, TIBC, IRON, RETICCTPCT in the last 72 hours. Urine analysis:    Component Value Date/Time   COLORURINE YELLOW 08/29/2017 1319   APPEARANCEUR CLEAR 08/29/2017 1319   LABSPEC 1.017 08/29/2017 1319   PHURINE 5.0 08/29/2017 1319   GLUCOSEU NEGATIVE 08/29/2017 1319   HGBUR NEGATIVE 08/29/2017 1319   BILIRUBINUR NEGATIVE 08/29/2017 1319   KETONESUR NEGATIVE 08/29/2017 1319   PROTEINUR 30 (A) 08/29/2017 1319   NITRITE NEGATIVE 08/29/2017 1319   LEUKOCYTESUR NEGATIVE 08/29/2017 1319   Sepsis Labs: @LABRCNTIP (procalcitonin:4,lacticidven:4) )No results found for this or any previous visit (from the past 240 hour(s)).   Radiological Exams on Admission: Dg Chest 2 View  Result Date: 08/29/2017 CLINICAL DATA:  Cough and fever. EXAM: CHEST  2 VIEW COMPARISON:  Chest x-ray 12/25/2014. FINDINGS: Cardiomegaly with mild pulmonary vascular prominence. Mild bilateral interstitial prominence small left pleural effusion. Mild CHF cannot be excluded. Pneumonitis cannot be excluded . IMPRESSION: Findings suggesting mild  CHF with mild interstitial edema. Mild pneumonitis cannot be  excluded. Electronically Signed   By: Marcello Moores  Register   On: 08/29/2017 14:51   Mr Frmur Right Wo Contrast  Result Date: 08/29/2017 CLINICAL DATA:  Sepsis. Evaluate for are right distal femur osteomyelitis or fluid collection. EXAM: MRI OF THE RIGHT FEMUR WITHOUT CONTRAST TECHNIQUE: Multiplanar, multisequence MR imaging of the right femur was performed. No intravenous contrast was administered. COMPARISON:  Right knee x-rays dated May 23, 2017. FINDINGS: Bones/Joint/Cartilage No suspicious marrow signal abnormality. Preserved normal T1 marrow signal. No fracture or dislocation. Moderate bilateral knee joint effusions. Ligaments The knee ligaments are not well evaluated due to field of view. Muscles and Tendons There is a small amount of edema in the distal right vastus medialis muscle. Additional mild edema in the right greater than left mid to distal vastus lateralis muscles, with adjacent fluid along the lateral fascia of the vastus lateralis muscles. The visualized tendons are intact. Soft tissues Deep soft tissue ulceration along the right anteromedial knee extending near the medial patellar retinaculum and proximal MCL. No drainable fluid collection. IMPRESSION: 1. Deep soft tissue ulceration along the right anteromedial knee extending near the medial patellar retinaculum and proximal MCL. No evidence of osteomyelitis. No drainable fluid collection. 2. Small amount of edema within the distal right vastus medialis muscle, nonspecific, but possibly representing infectious myositis given proximity to the right knee wound. 3. Additional mild edema and adjacent fascial fluid involving the right greater than left mid to distal vastus lateralis muscles, felt unlikely to be infectious in etiology given relative symmetric involvement of both legs. 4. Moderate bilateral knee joint effusions, nonspecific. Again, the involvement of both knee joints suggests these are likely noninfectious, however, given the proximity  of the right knee joint to the open right knee wound, consider right knee aspiration to rule out septic arthritis. Electronically Signed   By: Titus Dubin M.D.   On: 08/29/2017 16:12    EKG: (Independently reviewed) sinus tachycardia with ventricular rate 104 bpm, QTC 475 ms, normal R-wave rotation, no definitive acute ischemic changes  Assessment/Plan Principal Problem:   Sepsis  -Patient presents from home with increased redness of lower extremity with known chronic ulcer right knee -Sepsis physiology as follows: ? Mild altered mentation (likely from Ativan for MRI), fever greater than 102.50F, marked leukocytosis with marked left shift, metabolic acidosis despite normal lactic acid, acute kidney injury. -Agree with broad-spectrum antibiotics (meropenem/vancomycin IV as ordered by ER)-will continue -Cycle lactic acid -Initial systolic blood pressure elevated with current systolic blood pressure decreasing therefore continue normal saline IV at 150/hr -ESR -Urinalysis unremarkable -Chest x-ray with mild pneumonitis changes and given high fever consideration should be given to concurrent viral syndrome including influenza therefore check respiratory viral panel and influenza PCR  Active Problems:   Myositis -Orthopedic team consulted by EDP and have reviewed MRI which is suggestive of myositis, likely infectious in etiology -Antibiotics as above -Obtain CK and ESR as above    History of necrotic Ulcer of right knee s/p debridement and VAC tx/  Peripheral vascular disease  -Orthopedic team monitoring as above -Wound care per orthopedic team-given concerns over possible myositis w/ suspected infectious etiology uncertain if appropriate to utilize [facility] in this setting    Acute kidney injury w/ Metabolic acidosis on CKD (chronic kidney disease), stage III -Baseline renal function: 20/1.08 with GFR 48  -Current renal function: 40/1.61 with GFR 29  -Serum CO2 low at 18 with baseline 25    -  Follow labs  -Hydrate as above     Hypertension -Hold home antihypertensive medications in the setting of sepsis physiology re: concerns over possible evolution to hypotension     Acute hyponatremia -Suspect volume depletion  -Hydrate as above     Hypothyroid -Continue Synthroid       DVT prophylaxis: Lovenox Dose adjusted for GFR less than 30 Code Status:  full Family Communication:  no one at bedside  Disposition Plan: home Consults called: orthopedics/Dean     Theta Leaf L. ANP-BC [facility] Pager [phone]   If 7PM-7AM, please contact night-coverage [email or web address] Password [email or web address]  08/29/2017, 5:07 PM

## 2017-08-29 NOTE — ED Notes (Signed)
ED Provider at bedside. 

## 2017-08-29 NOTE — ED Notes (Signed)
Disregard previous note by this RN.  Entered in error.

## 2017-08-29 NOTE — ED Notes (Signed)
Admitting called for this pt and said it was ok to give 50mg  1 time dose of Tramadol.

## 2017-08-29 NOTE — ED Notes (Signed)
Patient transported to X-ray 

## 2017-08-29 NOTE — ED Notes (Signed)
This RN called pharmacy, the pt is to receive the 1250mg  of vancomycin now, and hold the 2000mg  until later.

## 2017-08-29 NOTE — ED Notes (Signed)
Report called to 42M RN all belongings taken with pt to floor.

## 2017-08-29 NOTE — ED Triage Notes (Signed)
Pt Brought in by [facility] from home where pt was seen by her wound care nurse and home health nurse today who called EMS reporting slothing at right knee wound and increased drainage and redness. Pt has wound care on MWF and home health reports that Wednesday her wound was not like this. 91% RA upon EMS arrival, placed on 2L [place]. EMS reports fever of 102.6 oral

## 2017-08-29 NOTE — Consult Note (Signed)
Reason for Consult:leg pain Referring Physician: Dr Shelby Reese is an 82 y.o. female.  HPI: Shelby Reese is a patient well-known to Dr. Jess Reese service.  He performed a skin graft on the right knee area a month ago.  Patient presents to the ER today with fever and sepsis.  She denies any injury.  He really feels very much malaise.  Does report some right lateral thigh pain.  Past Medical History:  Diagnosis Date  . Allergy   . Anemia   . CKD (chronic kidney disease), stage II   . Fibromyalgia   . GERD (gastroesophageal reflux disease)   . Hiatal hernia   . Hypertension   . Hypothyroid   . Lymphedema    venous insufficency  . Osteoarthritis   . Peripheral vascular disease ([name])   . Ulcer of knee ([name])    right  . Urinary incontinence   . Varicose veins   . Venous insufficiency     Past Surgical History:  Procedure Laterality Date  . APPLICATION OF WOUND VAC Right 07/18/2017   Procedure: APPLICATION OF WOUND VAC;  Surgeon: [name], MD;  Location: [place];  Service: Orthopedics;  Laterality: Right;  . CATARACT EXTRACTION W/ INTRAOCULAR LENS  IMPLANT, BILATERAL    . COLONOSCOPY    . I&D EXTREMITY Right 07/18/2017   Procedure: IRRIGATION AND DEBRIDEMENT RIGHT KNEE;  Surgeon: [name], MD;  Location: [name];  Service: Orthopedics;  Laterality: Right;  . I&D EXTREMITY Right 07/23/2017   Procedure: REPEAT IRRIGATION AND DEBRIDEMENT RIGHT KNEE;  Surgeon: [name], MD;  Location: [name];  Service: Orthopedics;  Laterality: Right;  . KNEE ARTHROSCOPY Left    menisectomy  . MULTIPLE TOOTH EXTRACTIONS    . SKIN SPLIT GRAFT Right 07/25/2017   Procedure: Repeat Irrigation and Debridement Right Knee, Split Thickness Skin Graft;  Surgeon: [name], MD;  Location: [name];  Service: Orthopedics;  Laterality: Right;  . TONSILLECTOMY      Family History  Problem Relation Age of Onset  . Stroke Mother   . Varicose Veins Mother   . Cancer Father        prostate  . Stroke  Sister   . Heart disease Sister   . Varicose Veins Sister   . Stroke Maternal Grandmother   . Varicose Veins Sister     Social History:  reports that she quit smoking about 37 years ago. She has a 5.00 pack-year smoking history. She has never used smokeless tobacco. She reports that she drinks about 8.4 oz of alcohol per week . She reports that she does not use drugs.  Allergies:  Allergies  Allergen Reactions  . Bactrim [Sulfamethoxazole-Trimethoprim] Diarrhea and Nausea Only  . Ciprofloxacin Other (See Comments)    tremors  . Diovan [Valsartan] Other (See Comments)    Extreme vertigo  . Food     Orange Juice-upset stomach/diarrhea  . Latex Other (See Comments)    Rash/inflammation due to exposure  . Nitrofuran Derivatives Hives    "Full body rash"  . Penicillins Hives and Swelling    Has patient had a PCN reaction causing immediate rash, facial/tongue/throat swelling, SOB or lightheadedness with hypotension:No--severe irritation at the injection site Has patient had a PCN reaction causing severe rash involving mucus membranes or skin necrosis:Unknown Has patient had a PCN reaction that required hospitalization:No Has patient had a PCN reaction occurring within the last 10 years:Yes If all of the above answers are "NO", then  may proceed with Cephalosporin use.   . Sulfa Antibiotics Diarrhea and Nausea Only  . Other Rash    Mycins    Medications: I have reviewed the patient's current medications.  Results for orders placed or performed during the hospital encounter of 08/29/17 (from the past 48 hour(s))  Comprehensive metabolic panel     Status: Abnormal   Collection Time: 08/29/17 12:55 PM  Result Value Ref Range   Sodium 131 (L) 135 - 145 mmol/L   Potassium 4.2 3.5 - 5.1 mmol/L   Chloride 102 101 - 111 mmol/L   CO2 18 (L) 22 - 32 mmol/L   Glucose, Bld 127 (H) 65 - 99 mg/dL   BUN 40 (H) 6 - 20 mg/dL   Creatinine, Ser 1.61 (H) 0.44 - 1.00 mg/dL   Calcium 9.7 8.9 - 10.3  mg/dL   Total Protein 7.2 6.5 - 8.1 g/dL   Albumin 3.4 (L) 3.5 - 5.0 g/dL   AST 24 15 - 41 U/L   ALT 15 14 - 54 U/L   Alkaline Phosphatase 65 38 - 126 U/L   Total Bilirubin 0.6 0.3 - 1.2 mg/dL   GFR calc non Af Amer 29 (L) >60 mL/min   GFR calc Af Amer 34 (L) >60 mL/min    Comment: (NOTE) The eGFR has been calculated using the CKD EPI equation. This calculation has not been validated in all clinical situations. eGFR's persistently <60 mL/min signify possible Chronic Kidney Disease.    Anion gap 11 5 - 15  CBC WITH DIFFERENTIAL     Status: Abnormal   Collection Time: 08/29/17 12:55 PM  Result Value Ref Range   WBC 33.6 (H) 4.0 - 10.5 K/uL   RBC 3.57 (L) 3.87 - 5.11 MIL/uL   Hemoglobin 9.1 (L) 12.0 - 15.0 g/dL   HCT 29.8 (L) 36.0 - 46.0 %   MCV 83.5 78.0 - 100.0 fL   MCH 25.5 (L) 26.0 - 34.0 pg   MCHC 30.5 30.0 - 36.0 g/dL   RDW 15.9 (H) 11.5 - 15.5 %   Platelets 302 150 - 400 K/uL   Neutrophils Relative % 95 %   Lymphocytes Relative 2 %   Monocytes Relative 3 %   Eosinophils Relative 0 %   Basophils Relative 0 %   Neutro Abs 31.9 (H) 1.7 - 7.7 K/uL   Lymphs Abs 0.7 0.7 - 4.0 K/uL   Monocytes Absolute 1.0 0.1 - 1.0 K/uL   Eosinophils Absolute 0.0 0.0 - 0.7 K/uL   Basophils Absolute 0.0 0.0 - 0.1 K/uL   WBC Morphology MILD LEFT SHIFT (1-5% METAS, OCC MYELO, OCC BANDS)   Urinalysis, Routine w reflex microscopic     Status: Abnormal   Collection Time: 08/29/17  1:19 PM  Result Value Ref Range   Color, Urine YELLOW YELLOW   APPearance CLEAR CLEAR   Specific Gravity, Urine 1.017 1.005 - 1.030   pH 5.0 5.0 - 8.0   Glucose, UA NEGATIVE NEGATIVE mg/dL   Hgb urine dipstick NEGATIVE NEGATIVE   Bilirubin Urine NEGATIVE NEGATIVE   Ketones, ur NEGATIVE NEGATIVE mg/dL   Protein, ur 30 (A) NEGATIVE mg/dL   Nitrite NEGATIVE NEGATIVE   Leukocytes, UA NEGATIVE NEGATIVE   RBC / HPF 0-5 0 - 5 RBC/hpf   WBC, UA 0-5 0 - 5 WBC/hpf   Bacteria, UA NONE SEEN NONE SEEN   Squamous  Epithelial / LPF 0-5 (A) NONE SEEN   Mucus PRESENT   Influenza panel by PCR (type A &  B)     Status: None   Collection Time: 08/29/17  1:20 PM  Result Value Ref Range   Influenza A By PCR NEGATIVE NEGATIVE   Influenza B By PCR NEGATIVE NEGATIVE    Comment: (NOTE) The Xpert Xpress Flu assay is intended as an aid in the diagnosis of  influenza and should not be used as a sole basis for treatment.  This  assay is FDA approved for nasopharyngeal swab specimens only. Nasal  washings and aspirates are unacceptable for Xpert Xpress Flu testing.   I-Stat CG4 Lactic Acid, ED  (not at  [facility])     Status: None   Collection Time: 08/29/17  1:21 PM  Result Value Ref Range   Lactic Acid, Venous 1.35 0.5 - 1.9 mmol/L  Sedimentation rate     Status: Abnormal   Collection Time: 08/29/17  5:56 PM  Result Value Ref Range   Sed Rate 92 (H) 0 - 22 mm/hr  CK     Status: None   Collection Time: 08/29/17  5:56 PM  Result Value Ref Range   Total CK 81 38 - 234 U/L  Lactic acid, plasma     Status: None   Collection Time: 08/29/17  5:56 PM  Result Value Ref Range   Lactic Acid, Venous 0.7 0.5 - 1.9 mmol/L  Magnesium     Status: None   Collection Time: 08/29/17  5:56 PM  Result Value Ref Range   Magnesium 1.8 1.7 - 2.4 mg/dL    Dg Chest 2 View  Result Date: 08/29/2017 CLINICAL DATA:  Cough and fever. EXAM: CHEST  2 VIEW COMPARISON:  Chest x-ray 12/25/2014. FINDINGS: Cardiomegaly with mild pulmonary vascular prominence. Mild bilateral interstitial prominence small left pleural effusion. Mild CHF cannot be excluded. Pneumonitis cannot be excluded . IMPRESSION: Findings suggesting mild CHF with mild interstitial edema. Mild pneumonitis cannot be excluded. Electronically Signed   By: Marcello Moores  Register   On: 08/29/2017 14:51   Mr Frmur Right Wo Contrast  Result Date: 08/29/2017 CLINICAL DATA:  Sepsis. Evaluate for are right distal femur osteomyelitis or fluid collection. EXAM: MRI OF THE RIGHT FEMUR WITHOUT  CONTRAST TECHNIQUE: Multiplanar, multisequence MR imaging of the right femur was performed. No intravenous contrast was administered. COMPARISON:  Right knee x-rays dated May 23, 2017. FINDINGS: Bones/Joint/Cartilage No suspicious marrow signal abnormality. Preserved normal T1 marrow signal. No fracture or dislocation. Moderate bilateral knee joint effusions. Ligaments The knee ligaments are not well evaluated due to field of view. Muscles and Tendons There is a small amount of edema in the distal right vastus medialis muscle. Additional mild edema in the right greater than left mid to distal vastus lateralis muscles, with adjacent fluid along the lateral fascia of the vastus lateralis muscles. The visualized tendons are intact. Soft tissues Deep soft tissue ulceration along the right anteromedial knee extending near the medial patellar retinaculum and proximal MCL. No drainable fluid collection. IMPRESSION: 1. Deep soft tissue ulceration along the right anteromedial knee extending near the medial patellar retinaculum and proximal MCL. No evidence of osteomyelitis. No drainable fluid collection. 2. Small amount of edema within the distal right vastus medialis muscle, nonspecific, but possibly representing infectious myositis given proximity to the right knee wound. 3. Additional mild edema and adjacent fascial fluid involving the right greater than left mid to distal vastus lateralis muscles, felt unlikely to be infectious in etiology given relative symmetric involvement of both legs. 4. Moderate bilateral knee joint effusions, nonspecific. Again, the involvement of  both knee joints suggests these are likely noninfectious, however, given the proximity of the right knee joint to the open right knee wound, consider right knee aspiration to rule out septic arthritis. Electronically Signed   By: Titus Dubin M.D.   On: 08/29/2017 16:12    Review of Systems  Constitutional: Positive for chills and fever.   Musculoskeletal: Positive for joint pain.   Blood pressure (!) 161/90, pulse 96, temperature 98.2 F (36.8 C), temperature source Oral, resp. rate 20, height 5' 4"  (1.626 m), weight 231 lb 4.2 oz (104.9 kg), SpO2 95 %. Physical Exam  Constitutional: She appears well-developed.  HENT:  Head: Normocephalic.  Eyes: Pupils are equal, round, and reactive to light.  Neck: Normal range of motion.  Cardiovascular: Normal rate.   Respiratory: Effort normal.  Neurological: She is alert.  Skin: Skin is warm.  Psychiatric: She has a normal mood and affect.  examination of bilateral lower extremities demonstrates skin graft over a knee wound.  Skin graft looks good.  Trace knee effusions bilaterally.  No real pain with passive knee range of motion.  No soft tissue crepitus around either lateral thigh but there is pain and tenderness to palpation on the right lateral thigh distally compared to the left.  No groin pain with internal/external rotation of the leg.  There is some cellulitis and bilateral distal lower extremities but no tissue crepitus to palpation.  Ankle dorsiflexion plantar flexion is intact.  Assessment/Plan: Impression is sepsis in a patient who does have some venous stasis in bilateral lower chemise along with cellulitis in the bilateral lower chemise.  I don't think the knee effusions are the source of her problem based on the small amount of knee effusion and the relatively pain-free range of motion that she has actively and passively in the knee.  These laterals fluid collections are nonoperative currently but may become operative.  She's tender on the right-hand side but nontender on the left.  Both appear equally mildly inflamed on the MRI scan.  This is something we have to watch but no surgical intervention indicated at this time.  Shelby Reese 08/29/2017, 8:17 PM

## 2017-08-29 NOTE — Progress Notes (Addendum)
Pharmacy Antibiotic Note  Shelby Reese is a 82 y.o. female admitted on 08/29/2017 with sepsis.  Pharmacy has been consulted for meropenem and vancomycin dosing. WBC 8.7, SCr 1.08. Tm 103.2. nCrCl ~ 48 mL/min.   Plan: -Merrem 2 gm IV Q 12 hours. May increase if renal fx improves  -Vancomycin 2 gm IV once, then vancomycin 1250 mg IV Q 24 hours -Monitor CBC, renal fx, cultures and clinical progress -VT at SS   Height: 5\' 4"  (162.6 cm) Weight: 230 lb (104.3 kg) IBW/kg (Calculated) : 54.7  Temp (24hrs), Avg:103.2 F (39.6 C), Min:103.2 F (39.6 C), Max:103.2 F (39.6 C)  No results for input(s): WBC, CREATININE, LATICACIDVEN, VANCOTROUGH, VANCOPEAK, VANCORANDOM, GENTTROUGH, GENTPEAK, GENTRANDOM, TOBRATROUGH, TOBRAPEAK, TOBRARND, AMIKACINPEAK, AMIKACINTROU, AMIKACIN in the last 168 hours.  CrCl cannot be calculated (Patient's most recent lab result is older than the maximum 21 days allowed.).    Allergies  Allergen Reactions  . Bactrim [Sulfamethoxazole-Trimethoprim] Diarrhea and Nausea Only  . Ciprofloxacin Other (See Comments)    tremors  . Diovan [Valsartan] Other (See Comments)    Extreme vertigo  . Food     Orange Juice-upset stomach/diarrhea  . Latex Other (See Comments)    Rash/inflammation due to exposure  . Nitrofuran Derivatives Hives    "Full body rash"  . Penicillins Hives and Swelling    Has patient had a PCN reaction causing immediate rash, facial/tongue/throat swelling, SOB or lightheadedness with hypotension:No--severe irritation at the injection site Has patient had a PCN reaction causing severe rash involving mucus membranes or skin necrosis:Unknown Has patient had a PCN reaction that required hospitalization:No Has patient had a PCN reaction occurring within the last 10 years:Yes If all of the above answers are "NO", then may proceed with Cephalosporin use.   . Sulfa Antibiotics Diarrhea and Nausea Only  . Other Rash    Mycins    Antimicrobials this  admission: Merrem 10/12 >>  Vanc 10/12 >>   Dose adjustments this admission: None   Microbiology results: None   Thank you for allowing pharmacy to be a part of this patient's care.  Albertina Parr, PharmD., BCPS Clinical Pharmacist Pager [phone]  Addendum: Patient was given 1250 mg once already. Will give additional vancomycin 1 gm to make total dose of 2250 mg. Will continue vancomycin maintenance dose as earlier.   Albertina Parr, PharmD., BCPS Clinical Pharmacist Pager [phone]

## 2017-08-29 NOTE — ED Notes (Signed)
Pt went from x-ray to MRI

## 2017-08-30 DIAGNOSIS — E039 Hypothyroidism, unspecified: Secondary | ICD-10-CM

## 2017-08-30 DIAGNOSIS — N183 Chronic kidney disease, stage 3 (moderate): Secondary | ICD-10-CM

## 2017-08-30 DIAGNOSIS — N179 Acute kidney failure, unspecified: Secondary | ICD-10-CM

## 2017-08-30 DIAGNOSIS — E872 Acidosis: Secondary | ICD-10-CM

## 2017-08-30 LAB — MRSA PCR SCREENING: MRSA by PCR: NEGATIVE

## 2017-08-30 LAB — COMPREHENSIVE METABOLIC PANEL
ALK PHOS: 54 U/L (ref 38–126)
ALT: 13 U/L — AB (ref 14–54)
AST: 22 U/L (ref 15–41)
Albumin: 2.6 g/dL — ABNORMAL LOW (ref 3.5–5.0)
Anion gap: 11 (ref 5–15)
BUN: 34 mg/dL — ABNORMAL HIGH (ref 6–20)
CALCIUM: 8.9 mg/dL (ref 8.9–10.3)
CO2: 16 mmol/L — AB (ref 22–32)
CREATININE: 1.31 mg/dL — AB (ref 0.44–1.00)
Chloride: 106 mmol/L (ref 101–111)
GFR calc non Af Amer: 38 mL/min — ABNORMAL LOW (ref >60)
GFR, EST AFRICAN AMERICAN: 44 mL/min — AB (ref >60)
GLUCOSE: 89 mg/dL (ref 65–99)
Potassium: 3.5 mmol/L (ref 3.5–5.1)
SODIUM: 133 mmol/L — AB (ref 135–145)
Total Bilirubin: 0.6 mg/dL (ref 0.3–1.2)
Total Protein: 5.7 g/dL — ABNORMAL LOW (ref 6.5–8.1)

## 2017-08-30 LAB — RESPIRATORY PANEL BY PCR
ADENOVIRUS-RVPPCR: NOT DETECTED
Bordetella pertussis: NOT DETECTED
CORONAVIRUS 229E-RVPPCR: NOT DETECTED
CORONAVIRUS HKU1-RVPPCR: NOT DETECTED
CORONAVIRUS NL63-RVPPCR: NOT DETECTED
CORONAVIRUS OC43-RVPPCR: NOT DETECTED
Chlamydophila pneumoniae: NOT DETECTED
Influenza A: NOT DETECTED
Influenza B: NOT DETECTED
METAPNEUMOVIRUS-RVPPCR: NOT DETECTED
Mycoplasma pneumoniae: NOT DETECTED
PARAINFLUENZA VIRUS 1-RVPPCR: NOT DETECTED
PARAINFLUENZA VIRUS 2-RVPPCR: NOT DETECTED
PARAINFLUENZA VIRUS 3-RVPPCR: NOT DETECTED
Parainfluenza Virus 4: NOT DETECTED
RHINOVIRUS / ENTEROVIRUS - RVPPCR: NOT DETECTED
Respiratory Syncytial Virus: NOT DETECTED

## 2017-08-30 LAB — CBC
HCT: 28.3 % — ABNORMAL LOW (ref 36.0–46.0)
Hemoglobin: 8.6 g/dL — ABNORMAL LOW (ref 12.0–15.0)
MCH: 25.2 pg — AB (ref 26.0–34.0)
MCHC: 30.4 g/dL (ref 30.0–36.0)
MCV: 83 fL (ref 78.0–100.0)
PLATELETS: 194 10*3/uL (ref 150–400)
RBC: 3.41 MIL/uL — AB (ref 3.87–5.11)
RDW: 15.9 % — ABNORMAL HIGH (ref 11.5–15.5)
WBC: 15.6 10*3/uL — ABNORMAL HIGH (ref 4.0–10.5)

## 2017-08-30 LAB — GLUCOSE, CAPILLARY: GLUCOSE-CAPILLARY: 108 mg/dL — AB (ref 65–99)

## 2017-08-30 MED ORDER — MEROPENEM 1 G IV SOLR
1.0000 g | Freq: Two times a day (BID) | INTRAVENOUS | Status: DC
  Administered 2017-08-30 – 2017-09-02 (×6): 1 g via INTRAVENOUS
  Filled 2017-08-30 (×8): qty 1

## 2017-08-30 MED ORDER — DILTIAZEM HCL ER COATED BEADS 120 MG PO CP24
120.0000 mg | ORAL_CAPSULE | Freq: Every day | ORAL | Status: DC
  Administered 2017-08-30 – 2017-09-04 (×6): 120 mg via ORAL
  Filled 2017-08-30 (×6): qty 1

## 2017-08-30 NOTE — Progress Notes (Signed)
Pharmacy Antibiotic Note  Shelby Reese is a 82 y.o. female admitted on 08/29/2017 with sepsis.  Pharmacy has been consulted for meropenem and vancomycin dosing.   SCr 1.31, nCrCl~40-50 ml/min.   Plan: 1. Adjust Meropenem to 1g IV every 12 hours 2. Continue Vancomycin 1250 mg IV every 24 hours 3. Will continue to follow renal function, culture results, LOT, and antibiotic de-escalation plans   Height: 5\' 4"  (162.6 cm) Weight: 231 lb 4.2 oz (104.9 kg) IBW/kg (Calculated) : 54.7  Temp (24hrs), Avg:98.9 F (37.2 C), Min:97.8 F (36.6 C), Max:100.7 F (38.2 C)   Recent Labs Lab 08/29/17 1255 08/29/17 1321 08/29/17 1756 08/29/17 2048 08/30/17 0403  WBC 33.6*  --   --   --  15.6*  CREATININE 1.61*  --   --   --  1.31*  LATICACIDVEN  --  1.35 0.7 0.8  --     Estimated Creatinine Clearance: 41.1 mL/min (A) (by C-G formula based on SCr of 1.31 mg/dL (H)).    Allergies  Allergen Reactions  . Bactrim [Sulfamethoxazole-Trimethoprim] Diarrhea and Nausea Only  . Ciprofloxacin Other (See Comments)    tremors  . Diovan [Valsartan] Other (See Comments)    Extreme vertigo  . Food     Orange Juice-upset stomach/diarrhea  . Latex Other (See Comments)    Rash/inflammation due to exposure  . Nitrofuran Derivatives Hives    "Full body rash"  . Penicillins Hives and Swelling    Has patient had a PCN reaction causing immediate rash, facial/tongue/throat swelling, SOB or lightheadedness with hypotension:No--severe irritation at the injection site Has patient had a PCN reaction causing severe rash involving mucus membranes or skin necrosis:Unknown Has patient had a PCN reaction that required hospitalization:No Has patient had a PCN reaction occurring within the last 10 years:Yes If all of the above answers are "NO", then may proceed with Cephalosporin use.   . Sulfa Antibiotics Diarrhea and Nausea Only  . Other Rash    Mycins    Antimicrobials this admission: Merrem 10/12 >>  Vanc  10/12 >>   Dose adjustments this admission: None   Microbiology results: 10/12 BCx >> 10/12 MRSA PCR >> negative 10/12 RVP >> negative  Thank you for allowing pharmacy to be a part of this patient's care.  Alycia Rossetti, PharmD, BCPS Clinical Pharmacist Pager: [phone] Clinical phone for 08/30/2017 from 7a-3:30p: [phone] If after 3:30p, please call main pharmacy at: [phone] 08/30/2017 1:39 PM

## 2017-08-30 NOTE — Progress Notes (Signed)
Still no fluid collection or crepitus around the lateral aspect of either thigh.  Erythema has improved.  Knee effusions unchanged.  Plan for continued observation.  No definite operative indication at this time

## 2017-08-30 NOTE — Progress Notes (Signed)
PROGRESS NOTE    Shelby Reese  HEN:[id number] DOB: Jan 31, 1935 DOA: 08/29/2017 PCP: [name], MD    Brief Narrative:  82 year old female who presented with right knee redness and drainage. She does have significant past medical history of peripheral vascular disease, necrotic right knee ulcer status post debridement and wound VAC placement by Dr. Sharol Given, stage III chronic kidney disease, hypertension, hypothyroidism and fibromyalgia. The day of admission her wound was assessed by home nurse, noted significant erythema and drainage she was also noted to have low oximetry saturation down to 91% as well as febrile 102.6. On her initial physical examination, blood pressure 130/52, heart rate 89, temperature 100.7, respiratory 18, oxygen saturation 94%. Her lungs were clear to auscultation bilaterally, no wheezing, rales rhonchi, heart S1-S2 present rhythmic, 3/6 systolic murmur left sternal border, abdomen was soft nontender, right lower extremity with significant erythema extending from about the knee to the foot associated with edema. Sodium 131, potassium 4.2, chloride 102, bicarbonate 18, glucose 127, BUN 40, creatinine 1.61, white count 33.6, hemoglobin 9.1, hematocrit 29.8, platelets 302. Urinalysis negative for infection. Chest x-ray with increase lung markings bilaterally, congested hilar regions bilaterally, no effusions or infiltrates. Right femur MRI, deep soft tissue ulceration along the right anteromedial knee extended near the medial patellar retinaculum and proximal MCL. No evidence of osteomyelitis. No drainable fluid collection. Small amount of edema within the distal right vastus medialis muscle, nonspecific, but possibly representing infectious myositis given proximity to the right knee wound. EKG sinus tachycardia, left axis deviation.  Patient was admitted to hospital with working diagnosis of sepsis, due to right lower extremity cellulitis/myositis.   Assessment & Plan:     Principal Problem:   Sepsis ([place]) Active Problems:   Myositis   CKD (chronic kidney disease), stage III ([facility])   Hypothyroid   Hypertension   Peripheral vascular disease ([facility])   History of necrotic Ulcer of right knee s/p debridement and VAC tx   Acute kidney injury ([place])   Metabolic acidosis   Acute hyponatremia   1. Sepsis due to right lower extremity cellulitis/myositis. Will continue antibiotic therapy with IV vancomycin and meropenem, continue local wound care and analgesics, Will decrease rate of IV fluids to prevent volume overload, follow a restrictive IV fluids strategy. Will continue to follow surgery recommendations, no surgery for now. Will advance diet.   2. Acute kidney injury chronic kidney disease stage 3, with non anion gap metabolic acidosis. Renal function with serum cr down to 1.3 from 1,61, will continue hydration with isotonic saline at 75 ml per hour, follow on renal panel in am. Serum k at 3,5 with serum bicarbonate at 16, with anion gap at 11. No documentation of urine output. Follow vancomycin levels per pharmacy protocol.   3. Hypertension. Systolic blood pressure 361 to 160, will resume diltiazem, but will continue to hold on benazepril, due to risk of worsening renal function.   4. Hypothyroid. Continue levothyroxine.    DVT prophylaxis: enoxaparin  Code Status: full Family Communication:  Disposition Plan: home   Consultants:   Orthopedic surgery  Procedures:     Antimicrobials:    Meropenem and vancomacyin.    Subjective: No significant pain at the right leg, dressing in place, no dyspnea, chest pain, nausea or vomiting.   Objective: Vitals:   08/29/17 2300 08/30/17 0300 08/30/17 0727 08/30/17 0800  BP: (!) 154/60 (!) 147/65 (!) 161/72 (!) 145/72  Pulse: 99 96 92   Resp: (!) 25 20 17    Temp:  98.2 F (36.8 C) 97.8 F (36.6 C) 99.8 F (37.7 C)   TempSrc: Oral Oral Oral   SpO2: 95% 93% 96% 94%  Weight:      Height:         Intake/Output Summary (Last 24 hours) at 08/30/17 0825 Last data filed at 08/30/17 0400  Gross per 24 hour  Intake           2242.5 ml  Output                0 ml  Net           2242.5 ml   Filed Weights   08/29/17 1242 08/29/17 1801  Weight: 104.3 kg (230 lb) 104.9 kg (231 lb 4.2 oz)    Examination:  General: Not in pain or dyspnea, deconditioned Neurology: Awake and alert, non focal  E ENT: mild pallor, no icterus, oral mucosa moist Cardiovascular: No JVD. S1-S2 present, rhythmic, no gallops, rubs, or murmurs. No lower extremity edema. Pulmonary: vesicular breath sounds bilaterally, adequate air movement, no wheezing, rhonchi or rales. Gastrointestinal. Abdomen flat, no organomegaly, non tender, no rebound or guarding Skin. Large open wound extending from medial to lateral, deep tissue expoussure, with dressing in placed, positive drainage. Erythema on the anterior leg, with increase local temperature.  Musculoskeletal: no joint deformities     Data Reviewed: I have personally reviewed following labs and imaging studies  CBC:  Recent Labs Lab 08/29/17 1255 08/30/17 0403  WBC 33.6* 15.6*  NEUTROABS 31.9*  --   HGB 9.1* 8.6*  HCT 29.8* 28.3*  MCV 83.5 83.0  PLT 302 127   Basic Metabolic Panel:  Recent Labs Lab 08/29/17 1255 08/29/17 1756 08/30/17 0403  NA 131*  --  133*  K 4.2  --  3.5  CL 102  --  106  CO2 18*  --  16*  GLUCOSE 127*  --  89  BUN 40*  --  34*  CREATININE 1.61*  --  1.31*  CALCIUM 9.7  --  8.9  MG  --  1.8  --    GFR: Estimated Creatinine Clearance: 41.1 mL/min (A) (by C-G formula based on SCr of 1.31 mg/dL (H)). Liver Function Tests:  Recent Labs Lab 08/29/17 1255 08/30/17 0403  AST 24 22  ALT 15 13*  ALKPHOS 65 54  BILITOT 0.6 0.6  PROT 7.2 5.7*  ALBUMIN 3.4* 2.6*   No results for input(s): LIPASE, AMYLASE in the last 168 hours. No results for input(s): AMMONIA in the last 168 hours. Coagulation Profile: No results  for input(s): INR, PROTIME in the last 168 hours. Cardiac Enzymes:  Recent Labs Lab 08/29/17 1756  CKTOTAL 81   BNP (last 3 results) No results for input(s): PROBNP in the last 8760 hours. HbA1C: No results for input(s): HGBA1C in the last 72 hours. CBG: No results for input(s): GLUCAP in the last 168 hours. Lipid Profile: No results for input(s): CHOL, HDL, LDLCALC, TRIG, CHOLHDL, LDLDIRECT in the last 72 hours. Thyroid Function Tests: No results for input(s): TSH, T4TOTAL, FREET4, T3FREE, THYROIDAB in the last 72 hours. Anemia Panel: No results for input(s): VITAMINB12, FOLATE, FERRITIN, TIBC, IRON, RETICCTPCT in the last 72 hours.    Radiology Studies: I have reviewed all of the imaging during this hospital visit personally     Scheduled Meds: . aspirin EC  81 mg Oral Q breakfast  . calcium-vitamin D  1 tablet Oral Q1500  . enoxaparin (LOVENOX) injection  40 mg Subcutaneous  Q24H  . famotidine  40 mg Oral Q1500  . ferrous sulfate  325 mg Oral QHS  . levothyroxine  175 mcg Oral QAC breakfast  . loratadine  10 mg Oral Daily  . magnesium oxide  200 mg Oral Q1500  . multivitamin with minerals  1 tablet Oral Daily  . sodium chloride flush  3 mL Intravenous Q12H  . vitamin C  500 mg Oral Daily   Continuous Infusions: . sodium chloride 150 mL/hr at 08/30/17 0657  . meropenem (MERREM) IV Stopped (08/30/17 0205)  . vancomycin Stopped (08/29/17 1509)     LOS: 1 day        Shelby Reese Gerome Apley, MD [facility] Pager [phone]

## 2017-08-31 DIAGNOSIS — I10 Essential (primary) hypertension: Secondary | ICD-10-CM

## 2017-08-31 LAB — BASIC METABOLIC PANEL
ANION GAP: 9 (ref 5–15)
BUN: 23 mg/dL — ABNORMAL HIGH (ref 6–20)
CALCIUM: 8.8 mg/dL — AB (ref 8.9–10.3)
CO2: 18 mmol/L — ABNORMAL LOW (ref 22–32)
Chloride: 107 mmol/L (ref 101–111)
Creatinine, Ser: 1.13 mg/dL — ABNORMAL HIGH (ref 0.44–1.00)
GFR calc Af Amer: 52 mL/min — ABNORMAL LOW (ref >60)
GFR, EST NON AFRICAN AMERICAN: 45 mL/min — AB (ref >60)
GLUCOSE: 116 mg/dL — AB (ref 65–99)
Potassium: 3.6 mmol/L (ref 3.5–5.1)
Sodium: 134 mmol/L — ABNORMAL LOW (ref 135–145)

## 2017-08-31 LAB — CBC WITH DIFFERENTIAL/PLATELET
BASOS ABS: 0 10*3/uL (ref 0.0–0.1)
BASOS PCT: 0 %
Eosinophils Absolute: 0.2 10*3/uL (ref 0.0–0.7)
Eosinophils Relative: 2 %
HEMATOCRIT: 27.4 % — AB (ref 36.0–46.0)
HEMOGLOBIN: 8.1 g/dL — AB (ref 12.0–15.0)
Lymphocytes Relative: 6 %
Lymphs Abs: 0.7 10*3/uL (ref 0.7–4.0)
MCH: 24.9 pg — ABNORMAL LOW (ref 26.0–34.0)
MCHC: 29.6 g/dL — ABNORMAL LOW (ref 30.0–36.0)
MCV: 84.3 fL (ref 78.0–100.0)
Monocytes Absolute: 0.7 10*3/uL (ref 0.1–1.0)
Monocytes Relative: 6 %
NEUTROS ABS: 10 10*3/uL — AB (ref 1.7–7.7)
NEUTROS PCT: 86 %
Platelets: 220 10*3/uL (ref 150–400)
RBC: 3.25 MIL/uL — ABNORMAL LOW (ref 3.87–5.11)
RDW: 15.9 % — AB (ref 11.5–15.5)
WBC: 11.5 10*3/uL — AB (ref 4.0–10.5)

## 2017-08-31 NOTE — Progress Notes (Signed)
PROGRESS NOTE    Shelby Reese  RDE:[id number] DOB: 06-03-1935 DOA: 08/29/2017 PCP: [name], MD    Brief Narrative:  82 year old female who presented with right knee redness and drainage. She does have significant past medical history of peripheral vascular disease, necrotic right knee ulcer status post debridement and wound VAC placement by Dr. Sharol Given, stage III chronic kidney disease, hypertension, hypothyroidism and fibromyalgia. The day of admission her wound was assessed by home nurse, noted significant erythema and drainage she was also noted to have low oximetry saturation down to 91% as well as febrile 102.6. On her initial physical examination, blood pressure 130/52, heart rate 89, temperature 100.7, respiratory 18, oxygen saturation 94%. Her lungs were clear to auscultation bilaterally, no wheezing, rales rhonchi, heart S1-S2 present rhythmic, 3/6 systolic murmur left sternal border, abdomen was soft nontender, right lower extremity with significant erythema extending from about the knee to the foot associated with edema. Sodium 131, potassium 4.2, chloride 102, bicarbonate 18, glucose 127, BUN 40, creatinine 1.61, white count 33.6, hemoglobin 9.1, hematocrit 29.8, platelets 302. Urinalysis negative for infection. Chest x-ray with increase lung markings bilaterally, congested hilar regions bilaterally, no effusions or infiltrates. Right femur MRI, deep soft tissue ulceration along the right anteromedial knee extended near the medial patellar retinaculum and proximal MCL. No evidence of osteomyelitis. No drainable fluid collection. Small amount of edema within the distal right vastus medialis muscle, nonspecific, but possibly representing infectious myositis given proximity to the right knee wound. EKG sinus tachycardia, left axis deviation.  Patient was admitted to hospital with working diagnosis of sepsis, due to right lower extremity cellulitis/myositis.    Assessment & Plan:     Principal Problem:   Sepsis ([place]) Active Problems:   Myositis   CKD (chronic kidney disease), stage III (HCC)   Hypothyroid   Hypertension   Peripheral vascular disease (HCC)   History of necrotic Ulcer of right knee s/p debridement and VAC tx   Acute kidney injury ([place])   Metabolic acidosis   Acute hyponatremia  1. Sepsis due to right lower extremity cellulitis/myositis. Patient continue to have a good response to antibiotic therapy with IV vancomycin and meropenem #1,  local wound care and analgesics with oxycodone and tramadol. Patient tolerating po well, will hold on IV fluids. Will continue to follow on surgery recommendations. Out of bed as tolerated, physical therapy evaluation, dc cardiac monitoring, and transfer to medical unit.   2. Acute kidney injury chronic kidney disease stage 3, with non anion gap metabolic acidosis. Continue to improve renal function with serum cr down to 1.13. Will stop IV fluids and follow on renal panel in am, avoid nephrotoxic medications or hypotension.  3. Hypertension. Systolic blood pressure continue stable at 150 to 160, continue diltiazem, and on hold benazepril.  4. Hypothyroid. Continue levothyroxine, with good toleration.     DVT prophylaxis: enoxaparin  Code Status: full Family Communication:  Disposition Plan: home   Consultants:   Orthopedic surgery  Procedures:     Antimicrobials:    Meropenem and vancomacyin.     Subjective: Patient feeling better, no nausea or vomiting, no chest pain or dyspnea, no significant pain on the right leg/ knee.   Objective: Vitals:   08/30/17 1921 08/30/17 2323 08/31/17 0000 08/31/17 0518  BP: (!) 155/57 (!) 151/64 (!) 123/97 (!) 165/66  Pulse: 85  84   Resp: 20  17   Temp: 98.3 F (36.8 C) 98.4 F (36.9 C)  98.8 F (37.1 C)  TempSrc: Oral Oral  Oral  SpO2: 98%  99%   Weight:      Height:        Intake/Output Summary (Last 24 hours) at 08/31/17 0820 Last data  filed at 08/31/17 0400  Gross per 24 hour  Intake           4405.5 ml  Output             1000 ml  Net           3405.5 ml   Filed Weights   08/29/17 1242 08/29/17 1801  Weight: 104.3 kg (230 lb) 104.9 kg (231 lb 4.2 oz)    Examination:   General: Not in pain or dyspnea, deconditioned Neurology: Awake and alert, non focal  E ENT: no pallor, no icterus, oral mucosa moist Cardiovascular: No JVD. S1-S2 present, rhythmic, no gallops, rubs, or murmurs. No lower extremity edema. Pulmonary: vesicular breath sounds bilaterally, adequate air movement, no wheezing, rhonchi or rales. Mild decreased breath sounds at the dependent zones.  Gastrointestinal. Abdomen protuberant, no organomegaly, non tender, no rebound or guarding Skin. Improved erythema on the right leg, no increase local temperature. Knee wound with dressing in place, with no evident purulence, positive granulation tissue.  Musculoskeletal: no joint deformities     Data Reviewed: I have personally reviewed following labs and imaging studies  CBC:  Recent Labs Lab 08/29/17 1255 08/30/17 0403 08/31/17 0301  WBC 33.6* 15.6* 11.5*  NEUTROABS 31.9*  --  10.0*  HGB 9.1* 8.6* 8.1*  HCT 29.8* 28.3* 27.4*  MCV 83.5 83.0 84.3  PLT 302 194 053   Basic Metabolic Panel:  Recent Labs Lab 08/29/17 1255 08/29/17 1756 08/30/17 0403 08/31/17 0301  NA 131*  --  133* 134*  K 4.2  --  3.5 3.6  CL 102  --  106 107  CO2 18*  --  16* 18*  GLUCOSE 127*  --  89 116*  BUN 40*  --  34* 23*  CREATININE 1.61*  --  1.31* 1.13*  CALCIUM 9.7  --  8.9 8.8*  MG  --  1.8  --   --    GFR: Estimated Creatinine Clearance: 47.7 mL/min (A) (by C-G formula based on SCr of 1.13 mg/dL (H)). Liver Function Tests:  Recent Labs Lab 08/29/17 1255 08/30/17 0403  AST 24 22  ALT 15 13*  ALKPHOS 65 54  BILITOT 0.6 0.6  PROT 7.2 5.7*  ALBUMIN 3.4* 2.6*   No results for input(s): LIPASE, AMYLASE in the last 168 hours. No results for input(s):  AMMONIA in the last 168 hours. Coagulation Profile: No results for input(s): INR, PROTIME in the last 168 hours. Cardiac Enzymes:  Recent Labs Lab 08/29/17 1756  CKTOTAL 81   BNP (last 3 results) No results for input(s): PROBNP in the last 8760 hours. HbA1C: No results for input(s): HGBA1C in the last 72 hours. CBG:  Recent Labs Lab 08/30/17 1123  GLUCAP 108*   Lipid Profile: No results for input(s): CHOL, HDL, LDLCALC, TRIG, CHOLHDL, LDLDIRECT in the last 72 hours. Thyroid Function Tests: No results for input(s): TSH, T4TOTAL, FREET4, T3FREE, THYROIDAB in the last 72 hours. Anemia Panel: No results for input(s): VITAMINB12, FOLATE, FERRITIN, TIBC, IRON, RETICCTPCT in the last 72 hours.    Radiology Studies: I have reviewed all of the imaging during this hospital visit personally     Scheduled Meds: . aspirin EC  81 mg Oral Q breakfast  . calcium-vitamin D  1  tablet Oral Q1500  . diltiazem  120 mg Oral Daily  . enoxaparin (LOVENOX) injection  40 mg Subcutaneous Q24H  . famotidine  40 mg Oral Q1500  . ferrous sulfate  325 mg Oral QHS  . levothyroxine  175 mcg Oral QAC breakfast  . loratadine  10 mg Oral Daily  . magnesium oxide  200 mg Oral Q1500  . multivitamin with minerals  1 tablet Oral Daily  . sodium chloride flush  3 mL Intravenous Q12H  . vitamin C  500 mg Oral Daily   Continuous Infusions: . sodium chloride 75 mL/hr at 08/30/17 2316  . meropenem (MERREM) IV Stopped (08/31/17 0344)  . vancomycin Stopped (08/30/17 1516)     LOS: 2 days        Shelby Embry Gerome Apley, MD [facility] Pager [phone]

## 2017-08-31 NOTE — Plan of Care (Signed)
Problem: Physical Regulation: Goal: Ability to maintain clinical measurements within normal limits will improve Outcome: Not Progressing Patient believes her legs are the worse they have been.  She believes that  This will take a long time for her body to heal.  Instructed patient that she will need to keep her legs elevated to help increase circulation and free form injury.  Asked patient if she thought should be able to keep her legs  And or if she wold k keep her legs and patient stated "I hope so."

## 2017-08-31 NOTE — Progress Notes (Signed)
Patient stable.  Pain unchanged. Bilateral knees trace effusion No tissue crepitus along the lateral aspect of either leg Redness decreased around the right lateral leg Venous stasis worse on the right lower extremity compared to left Overall still no operative indication at this time and the patient clinically appears to be improving with diminished white count and diminished fevers We will have Dr. Sharol Given evaluate in morning

## 2017-09-01 ENCOUNTER — Ambulatory Visit (INDEPENDENT_AMBULATORY_CARE_PROVIDER_SITE_OTHER): Payer: Medicare Other | Admitting: Orthopedic Surgery

## 2017-09-01 DIAGNOSIS — L03115 Cellulitis of right lower limb: Secondary | ICD-10-CM

## 2017-09-01 LAB — CBC WITH DIFFERENTIAL/PLATELET
BASOS PCT: 0 %
Basophils Absolute: 0 10*3/uL (ref 0.0–0.1)
EOS PCT: 4 %
Eosinophils Absolute: 0.4 10*3/uL (ref 0.0–0.7)
HEMATOCRIT: 25.9 % — AB (ref 36.0–46.0)
HEMOGLOBIN: 8.2 g/dL — AB (ref 12.0–15.0)
Lymphocytes Relative: 11 %
Lymphs Abs: 1.2 10*3/uL (ref 0.7–4.0)
MCH: 26.1 pg (ref 26.0–34.0)
MCHC: 31.7 g/dL (ref 30.0–36.0)
MCV: 82.5 fL (ref 78.0–100.0)
MONO ABS: 0.7 10*3/uL (ref 0.1–1.0)
Monocytes Relative: 7 %
NEUTROS PCT: 78 %
Neutro Abs: 8.2 10*3/uL — ABNORMAL HIGH (ref 1.7–7.7)
Platelets: UNDETERMINED 10*3/uL (ref 150–400)
RBC: 3.14 MIL/uL — ABNORMAL LOW (ref 3.87–5.11)
RDW: 16 % — ABNORMAL HIGH (ref 11.5–15.5)
WBC: 10.5 10*3/uL (ref 4.0–10.5)

## 2017-09-01 LAB — BASIC METABOLIC PANEL
Anion gap: 9 (ref 5–15)
BUN: 21 mg/dL — AB (ref 6–20)
CHLORIDE: 107 mmol/L (ref 101–111)
CO2: 19 mmol/L — AB (ref 22–32)
CREATININE: 1.09 mg/dL — AB (ref 0.44–1.00)
Calcium: 8.9 mg/dL (ref 8.9–10.3)
GFR calc Af Amer: 54 mL/min — ABNORMAL LOW (ref >60)
GFR calc non Af Amer: 47 mL/min — ABNORMAL LOW (ref >60)
GLUCOSE: 100 mg/dL — AB (ref 65–99)
Potassium: 3.8 mmol/L (ref 3.5–5.1)
SODIUM: 135 mmol/L (ref 135–145)

## 2017-09-01 MED ORDER — ZINC OXIDE 20 % EX OINT
TOPICAL_OINTMENT | Freq: Two times a day (BID) | CUTANEOUS | Status: DC
  Filled 2017-09-01: qty 28.35

## 2017-09-01 MED ORDER — HYDROCORTISONE 1 % EX CREA
TOPICAL_CREAM | Freq: Two times a day (BID) | CUTANEOUS | Status: DC
  Administered 2017-09-01 – 2017-09-02 (×3): via TOPICAL
  Filled 2017-09-01: qty 28

## 2017-09-01 MED ORDER — DIPHENHYDRAMINE HCL 25 MG PO CAPS
25.0000 mg | ORAL_CAPSULE | Freq: Four times a day (QID) | ORAL | Status: DC | PRN
  Administered 2017-09-01 – 2017-09-04 (×5): 25 mg via ORAL
  Filled 2017-09-01 (×4): qty 1

## 2017-09-01 MED ORDER — HYDROCORTISONE 1 % EX LOTN
TOPICAL_LOTION | Freq: Two times a day (BID) | CUTANEOUS | Status: DC
  Filled 2017-09-01: qty 118

## 2017-09-01 MED ORDER — DIPHENHYDRAMINE HCL 25 MG PO CAPS
25.0000 mg | ORAL_CAPSULE | Freq: Every day | ORAL | Status: DC
  Administered 2017-09-01: 25 mg via ORAL
  Filled 2017-09-01: qty 1

## 2017-09-01 MED ORDER — SILVER SULFADIAZINE 1 % EX CREA
TOPICAL_CREAM | Freq: Every day | CUTANEOUS | Status: DC
  Administered 2017-09-01 – 2017-09-02 (×2): via TOPICAL
  Filled 2017-09-01: qty 85

## 2017-09-01 MED ORDER — BENAZEPRIL HCL 40 MG PO TABS
40.0000 mg | ORAL_TABLET | Freq: Every day | ORAL | Status: DC
  Administered 2017-09-01 – 2017-09-04 (×4): 40 mg via ORAL
  Filled 2017-09-01 (×4): qty 1

## 2017-09-01 NOTE — Progress Notes (Signed)
[facility]  Patient Status: Active (receiving services up to time of hospitalization)  [facility] is providing the following services: RN  If patient discharges after hours, please call [phone].   Shelby Reese 09/01/2017, 10:33 AM

## 2017-09-01 NOTE — Consult Note (Signed)
ORTHOPAEDIC CONSULTATION  REQUESTING PHYSICIAN: Arrien, Jimmy Picket,*  Chief Complaint: new onset of fever and pain right lower extremity  HPI: Shelby Reese is a 82 y.o. female who presents with a split-thickness skin graft to the right knee. Patient has undergone multiple debridements she has pain upon presentation.  Past Medical History:  Diagnosis Date  . Allergy   . Anemia   . CKD (chronic kidney disease), stage II   . Fibromyalgia   . GERD (gastroesophageal reflux disease)   . Hiatal hernia   . Hypertension   . Hypothyroid   . Lymphedema    venous insufficency  . Osteoarthritis   . Peripheral vascular disease ([place])   . Ulcer of knee ([place])    right  . Urinary incontinence   . Varicose veins   . Venous insufficiency    Past Surgical History:  Procedure Laterality Date  . APPLICATION OF WOUND VAC Right 07/18/2017   Procedure: APPLICATION OF WOUND VAC;  Surgeon: [name], MD;  Location: [place];  Service: Orthopedics;  Laterality: Right;  . CATARACT EXTRACTION W/ INTRAOCULAR LENS  IMPLANT, BILATERAL    . COLONOSCOPY    . I&D EXTREMITY Right 07/18/2017   Procedure: IRRIGATION AND DEBRIDEMENT RIGHT KNEE;  Surgeon: [name], MD;  Location: [place];  Service: Orthopedics;  Laterality: Right;  . I&D EXTREMITY Right 07/23/2017   Procedure: REPEAT IRRIGATION AND DEBRIDEMENT RIGHT KNEE;  Surgeon: [name], MD;  Location: [place];  Service: Orthopedics;  Laterality: Right;  . KNEE ARTHROSCOPY Left    menisectomy  . MULTIPLE TOOTH EXTRACTIONS    . SKIN SPLIT GRAFT Right 07/25/2017   Procedure: Repeat Irrigation and Debridement Right Knee, Split Thickness Skin Graft;  Surgeon: [name], MD;  Location: [place];  Service: Orthopedics;  Laterality: Right;  . TONSILLECTOMY     Social History   Social History  . Marital status: Married    Spouse name: N/A  . Number of children: 0  . Years of education: N/A   Occupational History  .      retired Marine scientist.     Social History Main Topics  . Smoking status: Former Smoker    Packs/day: 0.50    Years: 10.00    Quit date: 11/19/1979  . Smokeless tobacco: Never Used  . Alcohol use 8.4 oz/week    14 Glasses of wine per week     Comment: 2 glasses of wine with dinner  . Drug use: No  . Sexual activity: Not Asked   Other Topics Concern  . None   Social History Narrative  . None   Family History  Problem Relation Age of Onset  . Stroke Mother   . Varicose Veins Mother   . Cancer Father        prostate  . Stroke Sister   . Heart disease Sister   . Varicose Veins Sister   . Stroke Maternal Grandmother   . Varicose Veins Sister    - negative except otherwise stated in the family history section Allergies  Allergen Reactions  . Bactrim [Sulfamethoxazole-Trimethoprim] Diarrhea and Nausea Only  . Ciprofloxacin Other (See Comments)    tremors  . Diovan [Valsartan] Other (See Comments)    Extreme vertigo  . Food     Orange Juice-upset stomach/diarrhea  . Latex Other (See Comments)    Rash/inflammation due to exposure  . Nitrofuran Derivatives Hives    "Full body rash"  . Penicillins Hives and  Swelling    Has patient had a PCN reaction causing immediate rash, facial/tongue/throat swelling, SOB or lightheadedness with hypotension:No--severe irritation at the injection site Has patient had a PCN reaction causing severe rash involving mucus membranes or skin necrosis:Unknown Has patient had a PCN reaction that required hospitalization:No Has patient had a PCN reaction occurring within the last 10 years:Yes If all of the above answers are "NO", then may proceed with Cephalosporin use.   . Sulfa Antibiotics Diarrhea and Nausea Only  . Other Rash    Mycins   Prior to Admission medications   Medication Sig Start Date End Date Taking? Authorizing Provider  acetaminophen (TYLENOL) 500 MG tablet Take 1,000 mg by mouth daily.   Yes [provider]  aspirin EC 81 MG tablet Take 81 mg  by mouth daily with breakfast.   Yes [provider]  benazepril (LOTENSIN) 40 MG tablet Take 40 mg by mouth daily with breakfast.  03/26/13  Yes [provider]  Calcium Carbonate-Vitamin D (CALCIUM 600+D) 600-400 MG-UNIT per tablet Take 1 tablet by mouth daily at 3 pm.    Yes [provider]  cetirizine (ZYRTEC) 10 MG tablet Take 10 mg by mouth at bedtime.    Yes [provider]  diltiazem (TIAZAC) 120 MG 24 hr capsule Take 120 mg by mouth daily with breakfast.  01/29/13  Yes [provider]  diphenhydrAMINE (BENADRYL) 25 mg capsule Take 50 mg by mouth every 6 (six) hours.    Yes [provider]  famotidine (PEPCID) 40 MG tablet Take 40 mg by mouth daily at 3 pm. 1600   Yes [provider]  ferrous sulfate 325 (65 FE) MG tablet Take 325 mg by mouth at bedtime.   Yes [provider]  Gauze Pads & Dressings (KERLIX GAUZE ROLL LARGE) MISC 1 Units by Does not apply route 4 (four) times daily. 07/14/17  Yes Suzan Slick, NP  Gauze Pads & Dressings 4"X4-1/2" PADS 2 Units by Does not apply route 4 (four) times daily. 07/14/17  Yes Dondra Prader R, NP  ibuprofen (ADVIL,MOTRIN) 200 MG tablet Take 400 mg by mouth daily.   Yes [provider]  Magnesium Oxide (MAG-200 PO) Take 200 mg by mouth daily at 3 pm.   Yes [provider]  Multiple Vitamin (MULTIVITAMIN WITH MINERALS) TABS tablet Take 1 tablet by mouth daily.   Yes [provider]  NATURAL PSYLLIUM FIBER PO Take 1 capsule by mouth daily.   Yes [provider]  pseudoephedrine (SUDAFED) 30 MG tablet Take 30 mg by mouth every 6 (six) hours.    Yes [provider]  SYNTHROID 175 MCG tablet Take 175 mcg by mouth daily before breakfast.  09/20/15  Yes [provider]  vitamin C (ASCORBIC ACID) 500 MG tablet Take 500 mg by mouth daily.   Yes [provider]  silver sulfADIAZINE (SILVADENE) 1 % cream Apply 1 application topically  daily. Patient not taking: Reported on 08/29/2017 07/14/17   Suzan Slick, NP   No results found. - pertinent xrays, CT, MRI studies were reviewed and independently interpreted  Positive ROS: All other systems have been reviewed and were otherwise negative with the exception of those mentioned in the HPI and as above.  Physical Exam: General: Alert, no acute distress Psychiatric: Patient is competent for consent with normal mood and affect Lymphatic: No axillary or cervical lymphadenopathy Cardiovascular: No pedal edema Respiratory: No cyanosis, no use of accessory musculature GI: No organomegaly,  abdomen is soft and non-tender  Skin: examination the skin graft has good healthy beefy granulation tissue there is a few areas of fibrinous exudative tissue however there is no cellulitis o abscess no pain with range of motion.   Neurologic: Patient does not have protective sensation bilateral lower extremities.   MUSCULOSKELETAL:  The thigh is non tender to palpation. Patient does have swelling in both lower extremities. Clinically there is no signs of infection at this time the right thigh or right knee wound.  Assessment: Assessment: Slow healing wound right knee.  Plan: Plan: We will continue Silvadene dressing changes. Patient will need to be set up to continue home health nurse dressing changes of Silvadene to the right knee wound. This should be changed at least 3 times a week. I will follow-up in the office in 2 weeks.  Thank you for the consult and the opportunity to see Ms. Merry Proud, MD [facility] [phone] 11:43 AM

## 2017-09-01 NOTE — Progress Notes (Signed)
PT Cancellation Note  Patient Details Name: LESBIA OTTAWAY MRN: [id number] DOB: Dec 02, 1934   Cancelled Treatment:    Reason Eval/Treat Not Completed: Other (comment).  Declined, laughing stating she has no need for PT.  Asked her to leave order open another day in case she changes her mind.  Pt agreed.   Ramond Dial 09/01/2017, 9:47 AM   Mee Hives, PT MS Acute Rehab Dept. Number: [place]

## 2017-09-01 NOTE — Progress Notes (Signed)
PROGRESS NOTE    Shelby Reese  ONG:[id number] DOB: [birth year] DOA: 08/29/2017 PCP: [name], MD    Brief Narrative:  82 year old female who presented with right knee redness and drainage. She does have significant past medical history of peripheral vascular disease, necrotic right knee ulcer status post debridement and wound VAC placement by Dr. Sharol Given, stage III chronic kidney disease, hypertension, hypothyroidism and fibromyalgia. The day of admission her wound was assessed by home nurse, noted significant erythema and drainage she was also noted to have low oximetry saturation down to 91% as well as febrile 102.6. On her initial physical examination, blood pressure 130/52, heart rate 89, temperature 100.7, respiratory 18, oxygen saturation 94%. Her lungs were clear to auscultation bilaterally, no wheezing, rales rhonchi, heart S1-S2 present rhythmic, 3/6 systolic murmur left sternal border, abdomen was soft nontender, right lower extremity with significant erythema extending from about the knee to the foot associated with edema. Sodium 131, potassium 4.2, chloride 102, bicarbonate 18, glucose 127, BUN 40, creatinine 1.61, white count 33.6, hemoglobin 9.1, hematocrit 29.8, platelets 302. Urinalysis negative for infection. Chest x-ray with increase lung markings bilaterally, congested hilar regions bilaterally, no effusions or infiltrates. Right femur MRI, deep soft tissue ulceration along the right anteromedial knee extended near the medial patellar retinaculum and proximal MCL. No evidence of osteomyelitis. No drainable fluid collection. Small amount of edema within the distal right vastus medialis muscle, nonspecific, but possibly representing infectious myositis given proximity to the right knee wound. EKG sinus tachycardia, left axis deviation.  Patient was admitted to hospital with working diagnosis of sepsis, due to right lower extremity cellulitis/myositis.    Assessment & Plan:     Principal Problem:   Sepsis ([place]) Active Problems:   Myositis   CKD (chronic kidney disease), stage III (HCC)   Hypothyroid   Hypertension   Peripheral vascular disease (HCC)   History of necrotic Ulcer of right knee s/p debridement and VAC tx   Acute kidney injury ([place])   Metabolic acidosis   Acute hyponatremia   1. Sepsis due to right lower extremity cellulitis/myositis. Antibiotic therapy with IV vancomycin and meropenem #2, continue with local wound care and analgesics with oxycodone and tramadol. Significant improvement of right leg erythema. Wound with no purulence.   2. Acute kidney injury chronic kidney disease stage 3, with non anion gap metabolic acidosis. Patient tolerating po well, serum cr down to 1.09. Off IV fluids. Continue to follow vancomycin levels per pharmacy protocol.   3. Hypertension. Systolic blood pressure up to 180, will resume benazepril and will continue diltiazem. Continue blood pressure monitoring with vital signs.   4. Hypothyroid. On levothyroxine.    5. Obesity. BMI 39. Will consult physical therapy for mobilization, out of bed tid to chair, in preparation for discharge in the next 24 hours.   DVT prophylaxis:enoxaparin Code Status:full Family Communication: Disposition Plan:home   Consultants:  Orthopedic surgery  Procedures:    Antimicrobials:   Meropenem and vancomacyin.     Subjective: Patient with aches and pains "all over", no specific pain on the right leg or knee, haas not been out of bed yet. No nausea or vomiting, no chest pain or dyspnea.   Objective: Vitals:   08/31/17 1917 08/31/17 2209 09/01/17 0217 09/01/17 0425  BP: (!) 168/56 (!) 170/56  (!) 180/58  Pulse: 80 86  89  Resp:    18  Temp: 98 F (36.7 C) 99.3 F (37.4 C)  98.4 F (36.9 C)  TempSrc: Oral  SpO2:  96%  94%  Weight:  104 kg (229 lb 4.5 oz) 104 kg (229 lb 4.5 oz)   Height:        Intake/Output Summary (Last 24 hours) at  09/01/17 0858 Last data filed at 09/01/17 0737  Gross per 24 hour  Intake              969 ml  Output                0 ml  Net              969 ml   Filed Weights   08/29/17 1801 08/31/17 2209 09/01/17 0217  Weight: 104.9 kg (231 lb 4.2 oz) 104 kg (229 lb 4.5 oz) 104 kg (229 lb 4.5 oz)    Examination:   General: Not in pain or dyspnea, deconditioned Neurology: Awake and alert, non focal  E ENT: mild pallor, no icterus, oral mucosa moist Cardiovascular: No JVD. S1-S2 present, rhythmic, no gallops, rubs, or murmurs. No lower extremity edema. Pulmonary: vesicular breath sounds bilaterally, adequate air movement, no wheezing, rhonchi or rales. Gastrointestinal. Abdomen flat, no organomegaly, non tender, no rebound or guarding Skin. Right leg with significant improvement of erythema, trace edema, right knee wound with dressing in place, no purulence.  Musculoskeletal: no joint deformities     Data Reviewed: I have personally reviewed following labs and imaging studies  CBC:  Recent Labs Lab 08/29/17 1255 08/30/17 0403 08/31/17 0301 09/01/17 0218  WBC 33.6* 15.6* 11.5* 10.5  NEUTROABS 31.9*  --  10.0* 8.2*  HGB 9.1* 8.6* 8.1* 8.2*  HCT 29.8* 28.3* 27.4* 25.9*  MCV 83.5 83.0 84.3 82.5  PLT 302 194 220 PLATELET CLUMPS NOTED ON SMEAR, UNABLE TO ESTIMATE   Basic Metabolic Panel:  Recent Labs Lab 08/29/17 1255 08/29/17 1756 08/30/17 0403 08/31/17 0301 09/01/17 0218  NA 131*  --  133* 134* 135  K 4.2  --  3.5 3.6 3.8  CL 102  --  106 107 107  CO2 18*  --  16* 18* 19*  GLUCOSE 127*  --  89 116* 100*  BUN 40*  --  34* 23* 21*  CREATININE 1.61*  --  1.31* 1.13* 1.09*  CALCIUM 9.7  --  8.9 8.8* 8.9  MG  --  1.8  --   --   --    GFR: Estimated Creatinine Clearance: 49.2 mL/min (A) (by C-G formula based on SCr of 1.09 mg/dL (H)). Liver Function Tests:  Recent Labs Lab 08/29/17 1255 08/30/17 0403  AST 24 22  ALT 15 13*  ALKPHOS 65 54  BILITOT 0.6 0.6  PROT 7.2  5.7*  ALBUMIN 3.4* 2.6*   No results for input(s): LIPASE, AMYLASE in the last 168 hours. No results for input(s): AMMONIA in the last 168 hours. Coagulation Profile: No results for input(s): INR, PROTIME in the last 168 hours. Cardiac Enzymes:  Recent Labs Lab 08/29/17 1756  CKTOTAL 81   BNP (last 3 results) No results for input(s): PROBNP in the last 8760 hours. HbA1C: No results for input(s): HGBA1C in the last 72 hours. CBG:  Recent Labs Lab 08/30/17 1123  GLUCAP 108*   Lipid Profile: No results for input(s): CHOL, HDL, LDLCALC, TRIG, CHOLHDL, LDLDIRECT in the last 72 hours. Thyroid Function Tests: No results for input(s): TSH, T4TOTAL, FREET4, T3FREE, THYROIDAB in the last 72 hours. Anemia Panel: No results for input(s): VITAMINB12, FOLATE, FERRITIN, TIBC, IRON, RETICCTPCT in the last 72 hours.  Radiology Studies: I have reviewed all of the imaging during this hospital visit personally     Scheduled Meds: . aspirin EC  81 mg Oral Q breakfast  . calcium-vitamin D  1 tablet Oral Q1500  . diltiazem  120 mg Oral Daily  . enoxaparin (LOVENOX) injection  40 mg Subcutaneous Q24H  . famotidine  40 mg Oral Q1500  . ferrous sulfate  325 mg Oral QHS  . levothyroxine  175 mcg Oral QAC breakfast  . loratadine  10 mg Oral Daily  . magnesium oxide  200 mg Oral Q1500  . multivitamin with minerals  1 tablet Oral Daily  . sodium chloride flush  3 mL Intravenous Q12H  . vitamin C  500 mg Oral Daily   Continuous Infusions: . meropenem (MERREM) IV Stopped (09/01/17 9675)  . vancomycin Stopped (08/31/17 1613)     LOS: 3 days        Mauricio Gerome Apley, MD [facility] Pager [phone]

## 2017-09-01 NOTE — Progress Notes (Signed)
Pt transferred to 2W from 65M around 2300. Pt alert and oriented. Skin was assessed, refer to flowsheets. Call light in reach and bed alarm activated.   Eleanora Neighbor, RN

## 2017-09-01 NOTE — Consult Note (Signed)
[place] Nurse wound consult note Reason for Consult: BLE Wound type: Unstageable pressure injury right heel Contact dermatitis from device (BP cuff and tape) bilateral UE STSG per Dr. Sharol Given to the right knee Pressure Injury POA: Yes Measurement:  Right heel: 1.5cm x 2.0cm x 0cm Area on the left upper arm, consistent with BP cuff placement and on the right AC space consistent with medical tape placement  I did not assess the right knee, Dr. Sharol Given was just in for his assessment, see his consultation notes. Wound bed: Right heel: 100% eschar, non fluctuant Left arm: reddened with skin intact, pruritic, right AC space reddened with intact skin, pruritic    Drainage (amount, consistency, odor)  Periwound: intact  Dressing procedure/placement/frequency:  Betadine to the right heel to keep stable Float heel with Prevalon boot, explained rationale to patient in detail.  She is ambulatory, I have instructed her that the offloading boot is only for use when she is in the bed.  Added wound care for the knee per Dr. Jess Barters recommendations. Added hydrocortisone for contact dermatitis from BP cuff and Latex tape.  PT WITH LATEX allergy.  Protect skin from any further exposure to latex   Discussed POC with patient and bedside nurse.  Re consult if needed, will not follow at this time. Thanks  Jadien [employer], RN,CWOCN, CNS, [place] [phone])

## 2017-09-01 NOTE — Plan of Care (Signed)
Problem: Health Behavior/Discharge Planning: Goal: Ability to manage health-related needs will improve Outcome: Progressing Patient aware of need to continue antibiotics and dressing changes at home.

## 2017-09-02 DIAGNOSIS — T7840XA Allergy, unspecified, initial encounter: Secondary | ICD-10-CM

## 2017-09-02 LAB — CBC WITH DIFFERENTIAL/PLATELET
Basophils Absolute: 0 10*3/uL (ref 0.0–0.1)
Basophils Relative: 0 %
Eosinophils Absolute: 0.5 10*3/uL (ref 0.0–0.7)
Eosinophils Relative: 4 %
HCT: 25.8 % — ABNORMAL LOW (ref 36.0–46.0)
Hemoglobin: 8 g/dL — ABNORMAL LOW (ref 12.0–15.0)
Lymphocytes Relative: 11 %
Lymphs Abs: 1.2 10*3/uL (ref 0.7–4.0)
MCH: 26 pg (ref 26.0–34.0)
MCHC: 31 g/dL (ref 30.0–36.0)
MCV: 83.8 fL (ref 78.0–100.0)
Monocytes Absolute: 1 10*3/uL (ref 0.1–1.0)
Monocytes Relative: 9 %
Neutro Abs: 8.3 10*3/uL — ABNORMAL HIGH (ref 1.7–7.7)
Neutrophils Relative %: 76 %
Platelets: 239 10*3/uL (ref 150–400)
RBC: 3.08 MIL/uL — ABNORMAL LOW (ref 3.87–5.11)
RDW: 15.6 % — ABNORMAL HIGH (ref 11.5–15.5)
WBC: 10.9 10*3/uL — ABNORMAL HIGH (ref 4.0–10.5)

## 2017-09-02 LAB — BASIC METABOLIC PANEL
Anion gap: 8 (ref 5–15)
BUN: 15 mg/dL (ref 6–20)
CALCIUM: 8.8 mg/dL — AB (ref 8.9–10.3)
CHLORIDE: 105 mmol/L (ref 101–111)
CO2: 21 mmol/L — AB (ref 22–32)
CREATININE: 1.13 mg/dL — AB (ref 0.44–1.00)
GFR calc non Af Amer: 45 mL/min — ABNORMAL LOW (ref >60)
GFR, EST AFRICAN AMERICAN: 52 mL/min — AB (ref >60)
Glucose, Bld: 122 mg/dL — ABNORMAL HIGH (ref 65–99)
Potassium: 3.5 mmol/L (ref 3.5–5.1)
Sodium: 134 mmol/L — ABNORMAL LOW (ref 135–145)

## 2017-09-02 LAB — HEMOGLOBIN AND HEMATOCRIT, BLOOD
HCT: 28.4 % — ABNORMAL LOW (ref 36.0–46.0)
Hemoglobin: 8.6 g/dL — ABNORMAL LOW (ref 12.0–15.0)

## 2017-09-02 MED ORDER — HYDROCORTISONE 0.5 % EX CREA
TOPICAL_CREAM | Freq: Three times a day (TID) | CUTANEOUS | Status: DC
  Administered 2017-09-02 – 2017-09-04 (×7): via TOPICAL
  Filled 2017-09-02 (×4): qty 28.35

## 2017-09-02 MED ORDER — METHYLPREDNISOLONE SODIUM SUCC 125 MG IJ SOLR: 60.0000 mg | Freq: Three times a day (TID) | INTRAMUSCULAR | Status: DC

## 2017-09-02 MED ORDER — METHYLPREDNISOLONE SODIUM SUCC 40 MG IJ SOLR
40.0000 mg | Freq: Three times a day (TID) | INTRAMUSCULAR | Status: DC
  Administered 2017-09-02 – 2017-09-03 (×2): 40 mg via INTRAVENOUS
  Filled 2017-09-02 (×2): qty 1

## 2017-09-02 MED ORDER — PREDNISONE 50 MG PO TABS: 60.0000 mg | ORAL_TABLET | Freq: Every day | ORAL | Status: DC

## 2017-09-02 MED ORDER — CEPHALEXIN 500 MG PO CAPS
500.0000 mg | ORAL_CAPSULE | Freq: Three times a day (TID) | ORAL | Status: DC
  Administered 2017-09-02 – 2017-09-03 (×4): 500 mg via ORAL
  Filled 2017-09-02 (×4): qty 1

## 2017-09-02 NOTE — Care Management Important Message (Signed)
Important Message  Patient Details  Name: Shelby Reese MRN: [id number] Date of Birth: 1935-07-12   Medicare Important Message Given:  Yes    [name] Abena 09/02/2017, 9:46 AM

## 2017-09-02 NOTE — Progress Notes (Signed)
PT Cancellation Note  Patient Details Name: Shelby Reese MRN: [id number] DOB: Apr 20, 1935   Cancelled Treatment:    Reason Eval/Treat Not Completed: Other (comment).  Pt has declined the visit again today, upset over her bleeding and feeling she is getting worse instead of better d/t being in the hospital.  Her plan is to go home and nursing is aware she is not planning to get PT.  Will need another order if pt changes her mind.   Ramond Dial 09/02/2017, 11:12 AM   Mee Hives, PT MS Acute Rehab Dept. Number: [place] and [place]

## 2017-09-02 NOTE — Progress Notes (Signed)
PROGRESS NOTE    Shelby Reese  AYT:[id number] DOB: 03-05-1935 DOA: 08/29/2017 PCP: [name], MD    Brief Narrative:  82 year old female who presented with right knee redness and drainage. She does have significant past medical history of peripheral vascular disease, necrotic right knee ulcer status post debridement and wound VAC placement by Dr. Sharol Given, stage III chronic kidney disease, hypertension, hypothyroidism and fibromyalgia. The day of admission her wound was assessed by home nurse, noted significant erythema and drainage she was also noted to have low oximetry saturation down to 91% as well as febrile 102.6. On her initial physical examination, blood pressure 130/52, heart rate 89, temperature 100.7, respiratory 18, oxygen saturation 94%. Her lungs were clear to auscultation bilaterally, no wheezing, rales rhonchi, heart S1-S2 present rhythmic, 3/6 systolic murmur left sternal border, abdomen was soft nontender, right lower extremity with significant erythema extending from about the knee to the foot associated with edema. Sodium 131, potassium 4.2, chloride 102, bicarbonate 18, glucose 127, BUN 40, creatinine 1.61, white count 33.6, hemoglobin 9.1, hematocrit 29.8, platelets 302. Urinalysis negative for infection. Chest x-ray with increase lung markings bilaterally, congested hilar regions bilaterally, no effusions or infiltrates. Right femur MRI, deep soft tissue ulceration along the right anteromedial knee extended near the medial patellar retinaculum and proximal MCL. No evidence of osteomyelitis. No drainable fluid collection. Small amount of edema within the distal right vastus medialis muscle, nonspecific, but possibly representing infectious myositis given proximity to the right knee wound. EKG sinus tachycardia, left axis deviation.  Patient was admitted to hospital with working diagnosis of sepsis, due to right lower extremity cellulitis/myositis.   Assessment & Plan:     Principal Problem:   Sepsis ([place]) Active Problems:   Myositis   CKD (chronic kidney disease), stage III (HCC)   Hypothyroid   Hypertension   Peripheral vascular disease (HCC)   History of necrotic Ulcer of right knee s/p debridement and VAC tx   Acute kidney injury ([place])   Metabolic acidosis   Acute hyponatremia   1. Significant allergic reaction (topical latex) (NEW). Patient with predominant rash at the site of blood pressure cuff, suggesting contact dermatitis. Will give one dose of prednisone and will add topica steroids. No wheezing, or dyspnea, no signs of systemic anaphylaxis.   2. Sepsis due to right lower extremity cellulitis/myositis. Continue to improve cellulitis, will transition to oral antibiotic therapy with cephalexin, patient tolerated well cefriaxone in the past, will continue local wound care, plus analgesics with oxycodone and tramadol.   2. Acute kidney injury chronic kidney disease stage 3, with non anion gap metabolic acidosis. Stable renal function with serum cr at 1.13 with k at 35. Will hold on vancomycin, will continue to follow renal panel in am.  3. Hypertension. On benazepril and diltiazem. Improved blood pressure with systolic 355.  4. Hypothyroid. Continue with levothyroxine.   5. Obesity. BMI 39. Out of bed as tolerated.    DVT prophylaxis:enoxaparin Code Status:full Family Communication: Disposition Plan:home   Consultants:  Orthopedic surgery  Procedures:    Antimicrobials:   Meropenem and vancomacyin.     Subjective: Patient had severe nose bleed this am, complains of generalized rash, more intense at the left arm, at the site of blood pressure cuff. No nausea or vomiting. Left leg with edema.   Objective: Vitals:   09/01/17 0425 09/01/17 0900 09/01/17 2225 09/02/17 0453  BP: (!) 180/58 (!) 167/55 (!) 173/61 (!) 190/71  Pulse: 89 81 90 89  Resp: 18  16 18 18   Temp: 98.4 F (36.9 C) 98.7 F (37.1 C) 98.6  F (37 C) 99.4 F (37.4 C)  TempSrc:  Oral Oral Oral  SpO2: 94% 96% 96% 95%  Weight:   103.9 kg (229 lb)   Height:        Intake/Output Summary (Last 24 hours) at 09/02/17 0941 Last data filed at 09/02/17 0454  Gross per 24 hour  Intake              760 ml  Output                0 ml  Net              760 ml   Filed Weights   08/31/17 2209 09/01/17 0217 09/01/17 2225  Weight: 104 kg (229 lb 4.5 oz) 104 kg (229 lb 4.5 oz) 103.9 kg (229 lb)    Examination:   General: Not in pain or dyspnea, deconditioned and very upset Neurology: Awake and alert, non focal  E ENT: no pallor, no icterus, oral mucosa moist Cardiovascular: No JVD. S1-S2 present, rhythmic, no gallops, rubs, or murmurs. Positive dependent lower extremity edema. Pulmonary: vesicular breath sounds bilaterally, adequate air movement, no wheezing, rhonchi or rales. Gastrointestinal. Abdomen flat, no organomegaly, non tender, no rebound or guarding Skin. Generalized hives, at the back and left axilla, with band like at the left arm, non tender, no skin rupture. Left leg with no erythema, knee wound with dressing in place.  Musculoskeletal: no joint deformities     Data Reviewed: I have personally reviewed following labs and imaging studies  CBC:  Recent Labs Lab 08/29/17 1255 08/30/17 0403 08/31/17 0301 09/01/17 0218 09/02/17 0218  WBC 33.6* 15.6* 11.5* 10.5 10.9*  NEUTROABS 31.9*  --  10.0* 8.2* 8.3*  HGB 9.1* 8.6* 8.1* 8.2* 8.0*  HCT 29.8* 28.3* 27.4* 25.9* 25.8*  MCV 83.5 83.0 84.3 82.5 83.8  PLT 302 194 220 PLATELET CLUMPS NOTED ON SMEAR, UNABLE TO ESTIMATE 161   Basic Metabolic Panel:  Recent Labs Lab 08/29/17 1255 08/29/17 1756 08/30/17 0403 08/31/17 0301 09/01/17 0218 09/02/17 0218  NA 131*  --  133* 134* 135 134*  K 4.2  --  3.5 3.6 3.8 3.5  CL 102  --  106 107 107 105  CO2 18*  --  16* 18* 19* 21*  GLUCOSE 127*  --  89 116* 100* 122*  BUN 40*  --  34* 23* 21* 15  CREATININE 1.61*  --   1.31* 1.13* 1.09* 1.13*  CALCIUM 9.7  --  8.9 8.8* 8.9 8.8*  MG  --  1.8  --   --   --   --    GFR: Estimated Creatinine Clearance: 47.4 mL/min (A) (by C-G formula based on SCr of 1.13 mg/dL (H)). Liver Function Tests:  Recent Labs Lab 08/29/17 1255 08/30/17 0403  AST 24 22  ALT 15 13*  ALKPHOS 65 54  BILITOT 0.6 0.6  PROT 7.2 5.7*  ALBUMIN 3.4* 2.6*   No results for input(s): LIPASE, AMYLASE in the last 168 hours. No results for input(s): AMMONIA in the last 168 hours. Coagulation Profile: No results for input(s): INR, PROTIME in the last 168 hours. Cardiac Enzymes:  Recent Labs Lab 08/29/17 1756  CKTOTAL 81   BNP (last 3 results) No results for input(s): PROBNP in the last 8760 hours. HbA1C: No results for input(s): HGBA1C in the last 72 hours. CBG:  Recent Labs Lab 08/30/17 1123  GLUCAP 108*   Lipid Profile: No results for input(s): CHOL, HDL, LDLCALC, TRIG, CHOLHDL, LDLDIRECT in the last 72 hours. Thyroid Function Tests: No results for input(s): TSH, T4TOTAL, FREET4, T3FREE, THYROIDAB in the last 72 hours. Anemia Panel: No results for input(s): VITAMINB12, FOLATE, FERRITIN, TIBC, IRON, RETICCTPCT in the last 72 hours.    Radiology Studies: I have reviewed all of the imaging during this hospital visit personally     Scheduled Meds: . benazepril  40 mg Oral Daily  . calcium-vitamin D  1 tablet Oral Q1500  . cephALEXin  500 mg Oral Q8H  . diltiazem  120 mg Oral Daily  . famotidine  40 mg Oral Q1500  . ferrous sulfate  325 mg Oral QHS  . hydrocortisone cream   Topical BID  . levothyroxine  175 mcg Oral QAC breakfast  . magnesium oxide  200 mg Oral Q1500  . multivitamin with minerals  1 tablet Oral Daily  . [START ON 09/03/2017] predniSONE  60 mg Oral Q breakfast  . silver sulfADIAZINE   Topical Daily  . sodium chloride flush  3 mL Intravenous Q12H  . vitamin C  500 mg Oral Daily   Continuous Infusions:   LOS: 4 days        Mauricio  Gerome Apley, MD [facility] Pager [phone]

## 2017-09-02 NOTE — Progress Notes (Signed)
Patient refused bed alarm. Will continue to monitor.  

## 2017-09-02 NOTE — Progress Notes (Signed)
Patient refused labs. MD notified and aware. Will continue to monitor.

## 2017-09-02 NOTE — Progress Notes (Signed)
Pt refused vitals at this time.

## 2017-09-03 DIAGNOSIS — Z881 Allergy status to other antibiotic agents status: Secondary | ICD-10-CM

## 2017-09-03 DIAGNOSIS — Z882 Allergy status to sulfonamides status: Secondary | ICD-10-CM

## 2017-09-03 DIAGNOSIS — Z87891 Personal history of nicotine dependence: Secondary | ICD-10-CM

## 2017-09-03 DIAGNOSIS — E871 Hypo-osmolality and hyponatremia: Secondary | ICD-10-CM

## 2017-09-03 DIAGNOSIS — R21 Rash and other nonspecific skin eruption: Secondary | ICD-10-CM

## 2017-09-03 DIAGNOSIS — I129 Hypertensive chronic kidney disease with stage 1 through stage 4 chronic kidney disease, or unspecified chronic kidney disease: Secondary | ICD-10-CM

## 2017-09-03 DIAGNOSIS — M797 Fibromyalgia: Secondary | ICD-10-CM

## 2017-09-03 DIAGNOSIS — L03115 Cellulitis of right lower limb: Secondary | ICD-10-CM

## 2017-09-03 DIAGNOSIS — Z880 Allergy status to penicillin: Secondary | ICD-10-CM

## 2017-09-03 DIAGNOSIS — L270 Generalized skin eruption due to drugs and medicaments taken internally: Secondary | ICD-10-CM

## 2017-09-03 LAB — CBC WITH DIFFERENTIAL/PLATELET
BASOS ABS: 0 10*3/uL (ref 0.0–0.1)
Basophils Relative: 0 %
EOS ABS: 0.1 10*3/uL (ref 0.0–0.7)
Eosinophils Relative: 1 %
HCT: 29 % — ABNORMAL LOW (ref 36.0–46.0)
HEMOGLOBIN: 8.7 g/dL — AB (ref 12.0–15.0)
LYMPHS PCT: 7 %
Lymphs Abs: 1 10*3/uL (ref 0.7–4.0)
MCH: 24.9 pg — ABNORMAL LOW (ref 26.0–34.0)
MCHC: 30 g/dL (ref 30.0–36.0)
MCV: 82.9 fL (ref 78.0–100.0)
Monocytes Absolute: 0.4 10*3/uL (ref 0.1–1.0)
Monocytes Relative: 3 %
NEUTROS ABS: 13.1 10*3/uL — AB (ref 1.7–7.7)
Neutrophils Relative %: 89 %
Platelets: 284 10*3/uL (ref 150–400)
RBC: 3.5 MIL/uL — AB (ref 3.87–5.11)
RDW: 15.7 % — ABNORMAL HIGH (ref 11.5–15.5)
WBC: 14.6 10*3/uL — AB (ref 4.0–10.5)

## 2017-09-03 LAB — CULTURE, BLOOD (ROUTINE X 2)
CULTURE: NO GROWTH
Culture: NO GROWTH
Special Requests: ADEQUATE
Special Requests: ADEQUATE

## 2017-09-03 MED ORDER — ENOXAPARIN SODIUM 40 MG/0.4ML ~~LOC~~ SOLN
40.0000 mg | Freq: Every day | SUBCUTANEOUS | Status: DC
Start: 2017-09-03 — End: 2017-09-04
  Filled 2017-09-03: qty 0.4

## 2017-09-03 MED ORDER — DIPHENHYDRAMINE HCL 25 MG PO CAPS
50.0000 mg | ORAL_CAPSULE | Freq: Once | ORAL | Status: AC | PRN
  Administered 2017-09-03: 50 mg via ORAL
  Filled 2017-09-03: qty 2

## 2017-09-03 MED ORDER — SILVER SULFADIAZINE 1 % EX CREA
TOPICAL_CREAM | Freq: Every day | CUTANEOUS | Status: DC
  Administered 2017-09-03 – 2017-09-04 (×2): via TOPICAL
  Filled 2017-09-03: qty 85

## 2017-09-03 MED ORDER — COLLAGENASE 250 UNIT/GM EX OINT
TOPICAL_OINTMENT | Freq: Every day | CUTANEOUS | Status: DC
  Filled 2017-09-03: qty 30

## 2017-09-03 MED ORDER — HYDRALAZINE HCL 25 MG PO TABS: 25.0000 mg | ORAL_TABLET | Freq: Three times a day (TID) | ORAL | Status: DC

## 2017-09-03 MED ORDER — LINEZOLID 600 MG PO TABS
600.0000 mg | ORAL_TABLET | Freq: Two times a day (BID) | ORAL | Status: DC
  Administered 2017-09-03 – 2017-09-04 (×3): 600 mg via ORAL
  Filled 2017-09-03 (×4): qty 1

## 2017-09-03 MED ORDER — HYDRALAZINE HCL 25 MG PO TABS
25.0000 mg | ORAL_TABLET | Freq: Three times a day (TID) | ORAL | Status: DC
  Administered 2017-09-03 – 2017-09-04 (×4): 25 mg via ORAL
  Filled 2017-09-03 (×4): qty 1

## 2017-09-03 MED ORDER — PREDNISONE 50 MG PO TABS
60.0000 mg | ORAL_TABLET | Freq: Every day | ORAL | Status: DC
  Administered 2017-09-04: 60 mg via ORAL
  Filled 2017-09-03: qty 1

## 2017-09-03 NOTE — Progress Notes (Signed)
PROGRESS NOTE    Shelby Reese   SAY:[id number]  DOB: 06-17-1935  DOA: 08/29/2017 PCP: [name], MD   Brief Narrative:  Shelby Reese is a 82 year old female who presented with right knee redness and drainage. She does have significant past medical history of  stage III chronic kidney disease, hypertension, hypothyroidism and fibromyalgia.  A few months ago, she developed a traumatic hematoma of the right knee complicated by infection. She underwent surgical debridement on 8/30 and 9/5. A skin graft was placed on 9/7 and she was continued on antibiotics until 9/10. She was discharged home with wound vac and a PRAFO boot for the right foot due to a deep tissue injury. The plan was to f/u with Dr Sharol Given.  On the day of admission, her wound was assessed by home nurse who noted significant erythema and drainage.  She was also noted to have low pulse oximetry of  91% and was febrile to 102.6.  Right femur MRI: deep soft tissue ulceration along the right anteromedial knee extended near the medial patellar retinaculum and proximal MCL. No evidence of osteomyelitis. No drainable fluid collection. Small amount of edema within the distal right vastus medialis muscle, nonspecific, but possibly representing infectious myositis given proximity to the right knee wound  Subjective: Pain in right leg but able to walk on it.  ROS: no complaints of nausea, vomiting, constipation diarrhea, cough, dyspnea or dysuria. No other complaints.   Assessment & Plan:   Principal Problem:   Sepsis, right lower extremity cellulitis, myositis - recent skin graft to right knee 9/7 - started on antibiotics on admission - switched from Meropenam and Vanc to Keflex on 10/16 - today, she has an increasing WBC count, temps ~ 99- have asked ID for input and  ID has started Zyvox but does not recommend further antibiotics once discharged - wound care recommendations: Silvadine dressing 3 x week  - ortho following as well-  Dr Sharol Given recommends office f/u in 2 wks  Active Problems:  Eschar on right heel - Wound care recommendations: Betadine and float heel with Prevlon boot while in bed  Allergic reaction - Latex? Appears to have a diffuse rash today on arms, upper back and legs- follow  CKD (chronic kidney disease), stage III (HCC) - baseline Cr about 1.2-1.4    Acute hyponatremia/ dehydration - sodium 131 on admission; BUN 40/ Cr 1.61 - improved    Hypothyroid - synthroid    Hypertension - Benzaepril and Diltiazem - BP still elevated- add Hydralazine today   Metabolic acidosis - due to sepsis? - is improving  Severe pedal edema - elevate feet - hold off on ACE wraps for now  DVT prophylaxis: start Lovenox Code Status: Full code Family Communication:  Disposition Plan: home when stable Consultants:   Ortho  ID Procedures:     Antimicrobials:  Anti-infectives    Start     Dose/Rate Route Frequency Ordered Stop   09/03/17 1115  linezolid (ZYVOX) tablet 600 mg     600 mg Oral Every 12 hours 09/03/17 1107     09/02/17 0945  cephALEXin (KEFLEX) capsule 500 mg  Status:  Discontinued     500 mg Oral Every 8 hours 09/02/17 0938 09/03/17 1107   08/30/17 1400  vancomycin (VANCOCIN) 1,250 mg in sodium chloride 0.9 % 250 mL IVPB  Status:  Discontinued     1,250 mg 166.7 mL/hr over 90 Minutes Intravenous Every 24 hours 08/29/17 1320 09/02/17 0938   08/30/17 1400  meropenem (MERREM) 1 g in sodium chloride 0.9 % 100 mL IVPB  Status:  Discontinued     1 g 200 mL/hr over 30 Minutes Intravenous Every 12 hours 08/30/17 1333 09/02/17 0938   08/30/17 0200  meropenem (MERREM) 2 g in sodium chloride 0.9 % 100 mL IVPB  Status:  Discontinued     2 g 200 mL/hr over 30 Minutes Intravenous Every 12 hours 08/29/17 1320 08/30/17 1333   08/29/17 1430  vancomycin (VANCOCIN) IVPB 1000 mg/200 mL premix     1,000 mg 200 mL/hr over 60 Minutes Intravenous  Once 08/29/17 1417 08/29/17 1711   08/29/17 1315   meropenem (MERREM) 1 g in sodium chloride 0.9 % 100 mL IVPB  Status:  Discontinued     1 g 200 mL/hr over 30 Minutes Intravenous  Once 08/29/17 1307 08/29/17 1313   08/29/17 1315  vancomycin (VANCOCIN) 1,250 mg in sodium chloride 0.9 % 250 mL IVPB  Status:  Discontinued     1,250 mg 166.7 mL/hr over 90 Minutes Intravenous  Once 08/29/17 1307 08/29/17 1314   08/29/17 1315  meropenem (MERREM) 2 g in sodium chloride 0.9 % 100 mL IVPB     2 g 200 mL/hr over 30 Minutes Intravenous  Once 08/29/17 1313 08/29/17 1431   08/29/17 1315  vancomycin (VANCOCIN) 2,000 mg in sodium chloride 0.9 % 500 mL IVPB  Status:  Discontinued     2,000 mg 250 mL/hr over 120 Minutes Intravenous  Once 08/29/17 1314 08/29/17 1417       Objective: Vitals:   09/02/17 1035 09/02/17 1731 09/03/17 0641 09/03/17 1015  BP: (!) 164/82  (!) 162/70 (!) 180/80  Pulse:  81 75   Resp:  17 18   Temp:  99 F (37.2 C) 97.8 F (36.6 C)   TempSrc:  Oral Oral   SpO2:  96% 97%   Weight:      Height:        Intake/Output Summary (Last 24 hours) at 09/03/17 1353 Last data filed at 09/03/17 1016  Gross per 24 hour  Intake              693 ml  Output                0 ml  Net              693 ml   Filed Weights   08/31/17 2209 09/01/17 0217 09/01/17 2225  Weight: 104 kg (229 lb 4.5 oz) 104 kg (229 lb 4.5 oz) 103.9 kg (229 lb)    Examination: General exam: Appears comfortable  HEENT: PERRLA, oral mucosa moist, no sclera icterus or thrush Respiratory system: Clear to auscultation. Respiratory effort normal. Cardiovascular system: S1 & S2 heard, RRR.  No murmurs  Gastrointestinal system: Abdomen soft, non-tender, nondistended. Normal bowel sound. No organomegaly Central nervous system: Alert and oriented. No focal neurological deficits. Extremities: No cyanosis, clubbing- has bilateral edema  In feet and legs.  Skin: large ulcer of right knee, eschar of right heel, maculo-papular rash over body Psychiatry:  Mood & affect  appropriate.     Data Reviewed: I have personally reviewed following labs and imaging studies  CBC:  Recent Labs Lab 08/29/17 1255 08/30/17 0403 08/31/17 0301 09/01/17 0218 09/02/17 0218 09/02/17 1409 09/03/17 0155  WBC 33.6* 15.6* 11.5* 10.5 10.9*  --  14.6*  NEUTROABS 31.9*  --  10.0* 8.2* 8.3*  --  13.1*  HGB 9.1* 8.6* 8.1* 8.2* 8.0* 8.6* 8.7*  HCT 29.8* 28.3* 27.4* 25.9* 25.8* 28.4* 29.0*  MCV 83.5 83.0 84.3 82.5 83.8  --  82.9  PLT 302 194 220 PLATELET CLUMPS NOTED ON SMEAR, UNABLE TO ESTIMATE 239  --  403   Basic Metabolic Panel:  Recent Labs Lab 08/29/17 1255 08/29/17 1756 08/30/17 0403 08/31/17 0301 09/01/17 0218 09/02/17 0218  NA 131*  --  133* 134* 135 134*  K 4.2  --  3.5 3.6 3.8 3.5  CL 102  --  106 107 107 105  CO2 18*  --  16* 18* 19* 21*  GLUCOSE 127*  --  89 116* 100* 122*  BUN 40*  --  34* 23* 21* 15  CREATININE 1.61*  --  1.31* 1.13* 1.09* 1.13*  CALCIUM 9.7  --  8.9 8.8* 8.9 8.8*  MG  --  1.8  --   --   --   --    GFR: Estimated Creatinine Clearance: 47.4 mL/min (A) (by C-G formula based on SCr of 1.13 mg/dL (H)). Liver Function Tests:  Recent Labs Lab 08/29/17 1255 08/30/17 0403  AST 24 22  ALT 15 13*  ALKPHOS 65 54  BILITOT 0.6 0.6  PROT 7.2 5.7*  ALBUMIN 3.4* 2.6*   No results for input(s): LIPASE, AMYLASE in the last 168 hours. No results for input(s): AMMONIA in the last 168 hours. Coagulation Profile: No results for input(s): INR, PROTIME in the last 168 hours. Cardiac Enzymes:  Recent Labs Lab 08/29/17 1756  CKTOTAL 81   BNP (last 3 results) No results for input(s): PROBNP in the last 8760 hours. HbA1C: No results for input(s): HGBA1C in the last 72 hours. CBG:  Recent Labs Lab 08/30/17 1123  GLUCAP 108*   Lipid Profile: No results for input(s): CHOL, HDL, LDLCALC, TRIG, CHOLHDL, LDLDIRECT in the last 72 hours. Thyroid Function Tests: No results for input(s): TSH, T4TOTAL, FREET4, T3FREE, THYROIDAB in the  last 72 hours. Anemia Panel: No results for input(s): VITAMINB12, FOLATE, FERRITIN, TIBC, IRON, RETICCTPCT in the last 72 hours. Urine analysis:    Component Value Date/Time   COLORURINE YELLOW 08/29/2017 [place] 08/29/2017 1319   LABSPEC 1.017 08/29/2017 1319   PHURINE 5.0 08/29/2017 1319   GLUCOSEU NEGATIVE 08/29/2017 1319   HGBUR NEGATIVE 08/29/2017 1319   BILIRUBINUR NEGATIVE 08/29/2017 1319   KETONESUR NEGATIVE 08/29/2017 1319   PROTEINUR 30 (A) 08/29/2017 1319   NITRITE NEGATIVE 08/29/2017 1319   LEUKOCYTESUR NEGATIVE 08/29/2017 1319   Sepsis Labs: @LABRCNTIP (procalcitonin:4,lacticidven:4) ) Recent Results (from the past 240 hour(s))  Blood Culture (routine x 2)     Status: None (Preliminary result)   Collection Time: 08/29/17 12:55 PM  Result Value Ref Range Status   Specimen Description BLOOD RIGHT ANTECUBITAL  Final   Special Requests   Final    BOTTLES DRAWN AEROBIC AND ANAEROBIC Blood Culture adequate volume   Culture NO GROWTH 4 DAYS  Final   Report Status PENDING  Incomplete  Blood Culture (routine x 2)     Status: None (Preliminary result)   Collection Time: 08/29/17  1:10 PM  Result Value Ref Range Status   Specimen Description BLOOD LEFT WRIST  Final   Special Requests   Final    BOTTLES DRAWN AEROBIC AND ANAEROBIC Blood Culture adequate volume   Culture NO GROWTH 4 DAYS  Final   Report Status PENDING  Incomplete  Respiratory Panel by PCR     Status: None   Collection Time: 08/29/17  4:39 PM  Result  Value Ref Range Status   Adenovirus NOT DETECTED NOT DETECTED Final   Coronavirus 229E NOT DETECTED NOT DETECTED Final   Coronavirus HKU1 NOT DETECTED NOT DETECTED Final   Coronavirus NL63 NOT DETECTED NOT DETECTED Final   Coronavirus OC43 NOT DETECTED NOT DETECTED Final   Metapneumovirus NOT DETECTED NOT DETECTED Final   Rhinovirus / Enterovirus NOT DETECTED NOT DETECTED Final   Influenza A NOT DETECTED NOT DETECTED Final   Influenza B  NOT DETECTED NOT DETECTED Final   Parainfluenza Virus 1 NOT DETECTED NOT DETECTED Final   Parainfluenza Virus 2 NOT DETECTED NOT DETECTED Final   Parainfluenza Virus 3 NOT DETECTED NOT DETECTED Final   Parainfluenza Virus 4 NOT DETECTED NOT DETECTED Final   Respiratory Syncytial Virus NOT DETECTED NOT DETECTED Final   Bordetella pertussis NOT DETECTED NOT DETECTED Final   Chlamydophila pneumoniae NOT DETECTED NOT DETECTED Final   Mycoplasma pneumoniae NOT DETECTED NOT DETECTED Final  MRSA PCR Screening     Status: None   Collection Time: 08/29/17  6:50 PM  Result Value Ref Range Status   MRSA by PCR NEGATIVE NEGATIVE Final    Comment:        The GeneXpert MRSA Assay (FDA approved for NASAL specimens only), is one component of a comprehensive MRSA colonization surveillance program. It is not intended to diagnose MRSA infection nor to guide or monitor treatment for MRSA infections.          Radiology Studies: No results found.    Scheduled Meds: . benazepril  40 mg Oral Daily  . calcium-vitamin D  1 tablet Oral Q1500  . collagenase   Topical Daily  . diltiazem  120 mg Oral Daily  . famotidine  40 mg Oral Q1500  . ferrous sulfate  325 mg Oral QHS  . hydrALAZINE  25 mg Oral Q8H  . hydrocortisone cream   Topical TID  . levothyroxine  175 mcg Oral QAC breakfast  . linezolid  600 mg Oral Q12H  . magnesium oxide  200 mg Oral Q1500  . multivitamin with minerals  1 tablet Oral Daily  . sodium chloride flush  3 mL Intravenous Q12H  . vitamin C  500 mg Oral Daily   Continuous Infusions:   LOS: 5 days    Time spent in minutes: 35    Debbe Odea, MD [facility] Pager: [email or web address] Password [email or web address] 09/03/2017, 1:53 PM

## 2017-09-03 NOTE — Consult Note (Signed)
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 She was admitted on 10/12 with fever of 103F, wbc of 15.6K, possible increase drainage from right knee wound. Physical exam suggests some cellulitis to right leg. Imaging showed no new changes to tissue defect from the ulcer associated to right knee (already known) and edema to her leg. She was empirically started on vancomycin plus meropenem. Roughly 3 days into her abtx course, she started to develop rash around left arm cuff and torso/back. She has hx of latex allergy. Arms also involved but spares legs. She was switched to cephalexin on 10/16 but rash had already started. She has numerous drug allergies but unsure if she came in contact with latex. She is concerned that her right knee has exudate in the wound bed. She was started on steroid cream and steroids to alleviate symptoms.  Past Medical History:  Diagnosis Date  . Allergy   . Anemia   . CKD (chronic kidney disease), stage II   . Fibromyalgia   . GERD (gastroesophageal reflux disease)   . Hiatal hernia   . Hypertension   . Hypothyroid   . Lymphedema    venous insufficency  . Osteoarthritis   . Peripheral vascular disease ([place])   . Ulcer of knee ([place])    right  . Urinary incontinence   . Varicose veins   .  Venous insufficiency     Allergies:  Allergies  Allergen Reactions  . Bactrim [Sulfamethoxazole-Trimethoprim] Diarrhea and Nausea Only  . Ciprofloxacin Other (See Comments)    tremors  . Diovan [Valsartan] Other (See Comments)    Extreme vertigo  . Food     Orange Juice-upset stomach/diarrhea  . Latex Other (See Comments)    Rash/inflammation due to exposure  . Nitrofuran Derivatives Hives    "Full body rash"  . Penicillins Hives and Swelling    *tolerated Ceftriaxone September 2018 Has patient had a PCN reaction causing immediate rash, facial/tongue/throat swelling, SOB or lightheadedness with hypotension:No--severe irritation at the injection site Has patient had a PCN reaction causing severe rash involving mucus membranes or skin necrosis:Unknown Has patient had a PCN reaction that required hospitalization:No Has patient had a PCN reaction occurring within the last 10 years:Yes If all of the above answers are "NO", then may proceed with  . Sulfa Antibiotics Diarrhea and Nausea Only  . Other Rash    Mycins    MEDICATIONS: . benazepril  40 mg Oral Daily  . calcium-vitamin D  1 tablet Oral Q1500  . collagenase   Topical Daily  . diltiazem  120 mg Oral Daily  . famotidine  40 mg Oral Q1500  . ferrous sulfate  325 mg Oral QHS  . hydrALAZINE  25 mg Oral Q8H  . hydrocortisone cream   Topical TID  . levothyroxine  175 mcg Oral QAC breakfast  . linezolid  600 mg Oral Q12H  . magnesium oxide  200 mg Oral Q1500  . multivitamin with minerals  1 tablet Oral Daily  . silver sulfADIAZINE   Topical Daily  . sodium chloride flush  3 mL Intravenous Q12H  . vitamin C  500 mg Oral Daily    Social History  Substance Use Topics  . Smoking status: Former Smoker    Packs/day: 0.50    Years: 10.00    Quit date: 11/19/1979  . Smokeless tobacco: Never Used  . Alcohol use 8.4 oz/week    14 Glasses of wine per week     Comment: 2 glasses of wine with dinner    Family History  Problem  Relation Age of Onset  . Stroke Mother   . Varicose Veins Mother   . Cancer Father        prostate  . Stroke Sister   . Heart disease Sister   . Varicose Veins Sister   . Stroke Maternal Grandmother   . Varicose Veins Sister     Review of Systems  Constitutional: Negative for fever, chills, diaphoresis, activity change, appetite change, fatigue and unexpected weight change.  HENT: Negative for congestion, sore throat, rhinorrhea, sneezing, trouble swallowing and sinus pressure.  Eyes: Negative for photophobia and visual disturbance.  Respiratory: Negative for cough, chest tightness, shortness of breath, wheezing and stridor.  Cardiovascular: Negative for chest pain, palpitations and leg swelling.  Gastrointestinal: Negative for nausea, vomiting, abdominal pain, diarrhea, constipation, blood in stool, abdominal distention and anal bleeding.  Genitourinary: Negative for dysuria, hematuria, flank pain and difficulty urinating.  Musculoskeletal: Negative for myalgias, back pain, joint swelling, arthralgias and gait problem.  Skin: pruritic rash Neurological: Negative for dizziness, tremors, weakness and light-headedness.  Hematological: Negative for adenopathy. Does not bruise/bleed easily.  Psychiatric/Behavioral: Negative for behavioral problems, confusion, sleep disturbance, dysphoric mood, decreased concentration and agitation.     OBJECTIVE: Temp:  [97.8 F (36.6 C)-99 F (37.2 C)] 97.8 F (36.6 C) (10/17 0641) Pulse Rate:  [75-81] 75 (10/17 0641) Resp:  [17-18] 18 (10/17 0641) BP: (162-180)/(70-80) 180/80 (10/17 1015) SpO2:  [96 %-97 %] 97 % (10/17 0641) Physical Exam  Constitutional:  oriented to person, place, and time. appears well-developed and well-nourished. No distress.  HENT: [place]/AT, PERRLA, no scleral icterus Mouth/Throat: Oropharynx is clear and moist. No oropharyngeal exudate.  Cardiovascular: Normal rate, regular rhythm and normal heart sounds. Exam reveals no  gallop and no friction rub.  No murmur heard.  Pulmonary/Chest: Effort normal and breath sounds normal. No respiratory distress.  has no wheezes.  Neck = supple, no nuchal rigidity Abdominal: Soft. Bowel sounds are normal.  exhibits no distension. There is no tenderness.  Ext: right knee large 20cm width and 8 cm height of lesion. 60% granulation some retained staples in the wound bed. Fibrinous debris in the wound bed. Right heel eschar Skin: macular papular rash on back and arms, some areas that coalesce, non blanching Psychiatric: a normal mood and affect.  behavior is normal.   LABS: Results for orders placed or performed during the hospital encounter of 08/29/17 (from the past 48 hour(s))  CBC with Differential/Platelet     Status: Abnormal   Collection Time: 09/02/17  2:18 AM  Result Value Ref Range   WBC 10.9 (H) 4.0 - 10.5  K/uL   RBC 3.08 (L) 3.87 - 5.11 MIL/uL   Hemoglobin 8.0 (L) 12.0 - 15.0 g/dL   HCT 25.8 (L) 36.0 - 46.0 %   MCV 83.8 78.0 - 100.0 fL   MCH 26.0 26.0 - 34.0 pg   MCHC 31.0 30.0 - 36.0 g/dL   RDW 15.6 (H) 11.5 - 15.5 %   Platelets 239 150 - 400 K/uL   Neutrophils Relative % 76 %   Neutro Abs 8.3 (H) 1.7 - 7.7 K/uL   Lymphocytes Relative 11 %   Lymphs Abs 1.2 0.7 - 4.0 K/uL   Monocytes Relative 9 %   Monocytes Absolute 1.0 0.1 - 1.0 K/uL   Eosinophils Relative 4 %   Eosinophils Absolute 0.5 0.0 - 0.7 K/uL   Basophils Relative 0 %   Basophils Absolute 0.0 0.0 - 0.1 K/uL  Basic metabolic panel     Status: Abnormal   Collection Time: 09/02/17  2:18 AM  Result Value Ref Range   Sodium 134 (L) 135 - 145 mmol/L   Potassium 3.5 3.5 - 5.1 mmol/L   Chloride 105 101 - 111 mmol/L   CO2 21 (L) 22 - 32 mmol/L   Glucose, Bld 122 (H) 65 - 99 mg/dL   BUN 15 6 - 20 mg/dL   Creatinine, Ser 1.13 (H) 0.44 - 1.00 mg/dL   Calcium 8.8 (L) 8.9 - 10.3 mg/dL   GFR calc non Af Amer 45 (L) >60 mL/min   GFR calc Af Amer 52 (L) >60 mL/min    Comment: (NOTE) The eGFR has been  calculated using the CKD EPI equation. This calculation has not been validated in all clinical situations. eGFR's persistently <60 mL/min signify possible Chronic Kidney Disease.    Anion gap 8 5 - 15  Hemoglobin and hematocrit, blood     Status: Abnormal   Collection Time: 09/02/17  2:09 PM  Result Value Ref Range   Hemoglobin 8.6 (L) 12.0 - 15.0 g/dL   HCT 28.4 (L) 36.0 - 46.0 %  CBC with Differential/Platelet     Status: Abnormal   Collection Time: 09/03/17  1:55 AM  Result Value Ref Range   WBC 14.6 (H) 4.0 - 10.5 K/uL   RBC 3.50 (L) 3.87 - 5.11 MIL/uL   Hemoglobin 8.7 (L) 12.0 - 15.0 g/dL   HCT 29.0 (L) 36.0 - 46.0 %   MCV 82.9 78.0 - 100.0 fL   MCH 24.9 (L) 26.0 - 34.0 pg   MCHC 30.0 30.0 - 36.0 g/dL   RDW 15.7 (H) 11.5 - 15.5 %   Platelets 284 150 - 400 K/uL   Neutrophils Relative % 89 %   Lymphocytes Relative 7 %   Monocytes Relative 3 %   Eosinophils Relative 1 %   Basophils Relative 0 %   Neutro Abs 13.1 (H) 1.7 - 7.7 K/uL   Lymphs Abs 1.0 0.7 - 4.0 K/uL   Monocytes Absolute 0.4 0.1 - 1.0 K/uL   Eosinophils Absolute 0.1 0.0 - 0.7 K/uL   Basophils Absolute 0.0 0.0 - 0.1 K/uL   WBC Morphology ATYPICAL LYMPHOCYTES     MICRO: 10/12 blood cx ngtd IMAGING: I have reviewed images of mri and do not see abscess within tissue  Assessment/Plan: 82yo F with hx of nonhealing right knee necrotic ulcer s/p debridement and skin graft. Admitted for fevers, leukocytosis and cellulitis which has improved. Unfortunately has developed maculopapular rash suspect drug allergy vs. Contact dermatitis.  - will d/c cephalexin and change to linezolid  645m po bid. Can keep on this while hospitalized and likely does not need further abtx once discharged.  - right knee ulcer/wound = appreciate wound care RN to evaluate knee and recommendation to do silver based cream with dressing changes TIW once she discharges  -maculopapular rash = unclear cause. Will change abtx but would recommend to  continue with steroid cream and short steroid pulse dosing to help with symptoms.  Call if further questions.   CElzie RingsSEast Rancho Dominguezfor Infectious Diseases [phone]

## 2017-09-03 NOTE — Progress Notes (Signed)
Patient refused to wear prevalon boot this shift Refused  Pillow placement under leg to offset pressure on right heel.

## 2017-09-03 NOTE — Care Management Note (Signed)
Case Management Note  Patient Details  Name: Shelby Reese MRN: [id number] Date of Birth: Apr 29, 1935  Subjective/Objective:   New latex allergy, sepsis due to right lower extremity cellulitis/myositis                 Action/Plan: Discharge Planning: NCM spoke to pt at bedside. 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 Will need updated HHRN order with instructions on dressing changes. WOC RN scheduled to see pt with instructions on dressing changes. Pt states she has RW and walker at home. Husband at home to assist with care. Husband drives her to appts. [facility] aware of admissio. Will contact [facility] for resumption of care at dc.   PCP [name] MD  Expected Discharge Date:                Expected Discharge Plan:  [place]  In-House Referral:  NA  Discharge planning Services  CM Consult  Post Acute Care Choice:  Home Health, Resumption of Svcs/PTA Provider Choice offered to:  Patient  DME Arranged:  N/A DME Agency:  NA  HH Arranged:  RN [facility] Agency:  [place]  Status of Service:  In process, will continue to follow  If discussed at Long Length of Stay Meetings, dates discussed:    Additional Comments:  Erenest Rasher, RN 09/03/2017, 10:52 AM

## 2017-09-03 NOTE — Progress Notes (Signed)
Per patient request, she advised me she is "taking charge" and said she wanted her heel dressing to consist of betadine with a 2x2 dry dressing with curlex wrap around ankle. I changed her knee dressing using Silvadene topical cream with 2 abdominal pads wrapped with curlex wrap around her knee, per her request. She stated foam dressings are preventing her wound on her heel from heeling and is refusing the heel foam dressings.

## 2017-09-03 NOTE — Progress Notes (Signed)
Per MD stated not to change knee dressing because WOC is coming to assess and update instructions on dressing changes. Orders followed, will continue to monitor.

## 2017-09-03 NOTE — Consult Note (Addendum)
WOC reviewed notes from orthopedic surgeon who is following this patient for her knee wound, he has ordered Silvadene dressings 3x wk. I have updated the wound care orders for this. While inpatient will have dressing changed on the knee daily, just to allow nurse to assess daily for changes.   Contacted bedside nurse to discuss new consult for same wound, group of physicians were in to the room today and have ordered Santyl for the knee wound and re-consulted me, however I would suggest not using both Santyl and Silvadene in combination.  I would suggest that we follow the surgeon's orders that is following this wound.  No new orders placed in the computer.   [place], [place], [place]

## 2017-09-04 DIAGNOSIS — L97819 Non-pressure chronic ulcer of other part of right lower leg with unspecified severity: Secondary | ICD-10-CM

## 2017-09-04 DIAGNOSIS — I739 Peripheral vascular disease, unspecified: Secondary | ICD-10-CM

## 2017-09-04 LAB — CBC
HEMATOCRIT: 26.4 % — AB (ref 36.0–46.0)
HEMOGLOBIN: 8 g/dL — AB (ref 12.0–15.0)
MCH: 25.2 pg — ABNORMAL LOW (ref 26.0–34.0)
MCHC: 30.3 g/dL (ref 30.0–36.0)
MCV: 83 fL (ref 78.0–100.0)
Platelets: 338 10*3/uL (ref 150–400)
RBC: 3.18 MIL/uL — AB (ref 3.87–5.11)
RDW: 15.4 % (ref 11.5–15.5)
WBC: 18.2 10*3/uL — AB (ref 4.0–10.5)

## 2017-09-04 MED ORDER — FERROUS SULFATE 325 (65 FE) MG PO TABS: 325.0000 mg | ORAL_TABLET | Freq: Two times a day (BID) | ORAL | 3 refills | Status: DC

## 2017-09-04 MED ORDER — HYDROCORTISONE 0.5 % EX CREA: TOPICAL_CREAM | Freq: Three times a day (TID) | CUTANEOUS | 3 refills | Status: DC

## 2017-09-04 MED ORDER — SILVER SULFADIAZINE 1 % EX CREA: 1.0000 "application " | TOPICAL_CREAM | Freq: Every day | CUTANEOUS | 2 refills | Status: DC

## 2017-09-04 MED ORDER — PREDNISONE 20 MG PO TABS: 40.0000 mg | ORAL_TABLET | Freq: Every day | ORAL | 0 refills | Status: DC

## 2017-09-04 MED ORDER — HYDRALAZINE HCL 25 MG PO TABS: 25.0000 mg | ORAL_TABLET | Freq: Three times a day (TID) | ORAL | 0 refills | Status: DC

## 2017-09-04 NOTE — Discharge Summary (Signed)
Physician Discharge Summary  Shelby Reese DDU:[id number] DOB: 09-Jul-1935 DOA: 08/29/2017  PCP: [name], MD  Admit date: 08/29/2017 Discharge date: 09/04/2017  Admitted From: home Disposition:  home   Recommendations for Outpatient Follow-up:  1. F/u Hb and ensure she is taking Iron  Home Health:  ordered    Discharge Condition:  stable   CODE STATUS:  Full code   Consultations:  ID  Ortho   Discharge Diagnoses:  Principal Problem:   Sepsis ([place]) Active Problems:   Acute kidney injury ([place])   Metabolic acidosis   Acute hyponatremia   Allergic drug rash   CKD (chronic kidney disease), stage III (HCC)   Hypothyroid   Hypertension   Peripheral vascular disease (HCC)   History of necrotic Ulcer of right knee s/p debridement and VAC tx    Subjective: Rash is improving. Not much pain in right knee. No other complaints.   Brief Summary: Shelby Reese is a 82 year old female who presented with right knee redness and drainage. She does have significant past medical history of  stage III chronic kidney disease, hypertension, hypothyroidism and fibromyalgia.  A few months ago, she developed a traumatic hematoma of the right knee complicated by infection. She underwent surgical debridement on 8/30 and 9/5. A skin graft was placed on 9/7 and she was continued on antibiotics until 9/10. She was discharged home with wound vac and a PRAFO boot for the right foot due to a deep tissue injury. The plan was to f/u with Dr Sharol Given.  On the day of admission, her wound was assessed by home nurse who noted significant erythema and drainage.  She was also noted to have low pulse oximetry of  91% and was febrile to 102.6.  Right femur MRI: deep soft tissue ulceration along the right anteromedial knee extended near the medial patellar retinaculum and proximal MCL. No evidence of osteomyelitis. No drainable fluid collection. Small amount of edema within the distal right vastus medialis muscle,  nonspecific, but possibly representing infectious myositis given proximity to the right knee wound  Hospital Course:  Principal Problem:   Sepsis, right lower extremity cellulitis, myositis - recent split thickness skin graft to right knee 9/7 - started on antibiotics on admission - switched from Meropenam and Vanc to Keflex on 10/16 - 10/17-   increasing WBC count, temps ~ 99- have asked ID for input and  ID has started Zyvox but does not recommend further antibiotics once discharged - wound care recommendations: Silvadine dressing 3 x week - home health ordered- patient is a retired Marine scientist as well  - Dr Sharol Given recommends office f/u in 2 wks  Active Problems:  Eschar on right heel - Wound care recommendations: Betadine and float heel with Prevlon boot while in bed  Allergic reaction - initially appeared to have started at site of BP cuff but then became more diffuse to arms, back and feet- she had been on Keflex for > 24 hrs prior to rash starting-  culprit drug not certain. - rash is fading today- will give prescription for short course of Prednisone- she already takes Benadryl multiple times a day for other allergies   CKD (chronic kidney disease), stage III (HCC) - baseline Cr about 1.2-1.4    Acute hyponatremia/ dehydration - sodium 131 on admission; BUN 40/ Cr 1.61 - improved    Hypothyroid - synthroid    Hypertension - Benzaepril and Diltiazem - BP still elevated- added Hydralazine but patient states her BP goes up only  when she gets aggravated and is hesitant to continue medication- I have asked her to check her BP BID, keep a log and take Hydralazine PRN per instructions- she needs to f/u with PCP in 1-2 wks for f/u   Metabolic acidosis - due to sepsis? - is improving  Severe pedal edema - improved with elevation of feet - patient aware that this needs to be continued at home   Discharge Instructions  Discharge Instructions    Diet - low sodium heart  healthy    Complete by:  As directed    Increase activity slowly    Complete by:  As directed      Allergies as of 09/04/2017      Reactions   Bactrim [sulfamethoxazole-trimethoprim] Diarrhea, Nausea Only   Ciprofloxacin Other (See Comments)   tremors   Diovan [valsartan] Other (See Comments)   Extreme vertigo   Food    Orange Juice-upset stomach/diarrhea   Latex Other (See Comments)   Rash/inflammation due to exposure   Nitrofuran Derivatives Hives   "Full body rash"   Penicillins Hives, Swelling   *tolerated Ceftriaxone September 2018 Has patient had a PCN reaction causing immediate rash, facial/tongue/throat swelling, SOB or lightheadedness with hypotension:No--severe irritation at the injection site Has patient had a PCN reaction causing severe rash involving mucus membranes or skin necrosis:Unknown Has patient had a PCN reaction that required hospitalization:No Has patient had a PCN reaction occurring within the last 10 years:Yes If all of the above answers are "NO", then may proceed with   Sulfa Antibiotics Diarrhea, Nausea Only   Other Rash   Mycins      Medication List    STOP taking these medications   pseudoephedrine 30 MG tablet Commonly known as:  SUDAFED     TAKE these medications   acetaminophen 500 MG tablet Commonly known as:  TYLENOL Take 1,000 mg by mouth daily.   aspirin EC 81 MG tablet Take 81 mg by mouth daily with breakfast.   benazepril 40 MG tablet Commonly known as:  LOTENSIN Take 40 mg by mouth daily with breakfast.   CALCIUM 600+D 600-400 MG-UNIT tablet Generic drug:  Calcium Carbonate-Vitamin D Take 1 tablet by mouth daily at 3 pm.   cetirizine 10 MG tablet Commonly known as:  ZYRTEC Take 10 mg by mouth at bedtime.   diltiazem 120 MG 24 hr capsule Commonly known as:  TIAZAC Take 120 mg by mouth daily with breakfast.   diphenhydrAMINE 25 mg capsule Commonly known as:  BENADRYL Take 50 mg by mouth every 6 (six) hours.    famotidine 40 MG tablet Commonly known as:  PEPCID Take 40 mg by mouth daily at 3 pm. 1600   ferrous sulfate 325 (65 FE) MG tablet Take 1 tablet (325 mg total) by mouth 2 (two) times daily with a meal. What changed:  when to take this   Gauze Pads & Dressings 4"X4-1/2" Pads 2 Units by Does not apply route 4 (four) times daily.   KERLIX GAUZE ROLL LARGE Misc 1 Units by Does not apply route 4 (four) times daily.   hydrALAZINE 25 MG tablet Commonly known as:  APRESOLINE Take 1 tablet (25 mg total) by mouth every 8 (eight) hours.   hydrocortisone cream 0.5 % Apply topically 3 (three) times daily.   ibuprofen 200 MG tablet Commonly known as:  ADVIL,MOTRIN Take 400 mg by mouth daily.   MAG-200 PO Take 200 mg by mouth daily at 3 pm.   multivitamin with  minerals Tabs tablet Take 1 tablet by mouth daily.   NATURAL PSYLLIUM FIBER PO Take 1 capsule by mouth daily.   predniSONE 20 MG tablet Commonly known as:  DELTASONE Take 2 tablets (40 mg total) by mouth daily with breakfast. 40 mg on 10/17, 20 mg on 10/18, 10 mg on 10/19 and then 5mg  on 10/20   silver sulfADIAZINE 1 % cream Commonly known as:  SILVADENE Apply 1 application topically daily.   SYNTHROID 175 MCG tablet Generic drug:  levothyroxine Take 175 mcg by mouth daily before breakfast.   vitamin C 500 MG tablet Commonly known as:  ASCORBIC ACID Take 500 mg by mouth daily.      Follow-up Information    [name], MD Follow up in 2 week(s).   Specialty:  Orthopedic Surgery Contact information: [place] [address] [phone]        [name], MD Follow up in 2 week(s).   Specialty:  Internal Medicine Why:  BP check Contact information: [address] [place] [address] [phone]          Allergies  Allergen Reactions  . Bactrim [Sulfamethoxazole-Trimethoprim] Diarrhea and Nausea Only  . Ciprofloxacin Other (See Comments)    tremors  .  Diovan [Valsartan] Other (See Comments)    Extreme vertigo  . Food     Orange Juice-upset stomach/diarrhea  . Latex Other (See Comments)    Rash/inflammation due to exposure  . Nitrofuran Derivatives Hives    "Full body rash"  . Penicillins Hives and Swelling    *tolerated Ceftriaxone September 2018 Has patient had a PCN reaction causing immediate rash, facial/tongue/throat swelling, SOB or lightheadedness with hypotension:No--severe irritation at the injection site Has patient had a PCN reaction causing severe rash involving mucus membranes or skin necrosis:Unknown Has patient had a PCN reaction that required hospitalization:No Has patient had a PCN reaction occurring within the last 10 years:Yes If all of the above answers are "NO", then may proceed with  . Sulfa Antibiotics Diarrhea and Nausea Only  . Other Rash    Mycins     Procedures/Studies:    Dg Chest 2 View  Result Date: 08/29/2017 CLINICAL DATA:  Cough and fever. EXAM: CHEST  2 VIEW COMPARISON:  Chest x-ray 12/25/2014. FINDINGS: Cardiomegaly with mild pulmonary vascular prominence. Mild bilateral interstitial prominence small left pleural effusion. Mild CHF cannot be excluded. Pneumonitis cannot be excluded . IMPRESSION: Findings suggesting mild CHF with mild interstitial edema. Mild pneumonitis cannot be excluded. Electronically Signed   By: Marcello Moores  Register   On: 08/29/2017 14:51   Mr Frmur Right Wo Contrast  Result Date: 08/29/2017 CLINICAL DATA:  Sepsis. Evaluate for are right distal femur osteomyelitis or fluid collection. EXAM: MRI OF THE RIGHT FEMUR WITHOUT CONTRAST TECHNIQUE: Multiplanar, multisequence MR imaging of the right femur was performed. No intravenous contrast was administered. COMPARISON:  Right knee x-rays dated May 23, 2017. FINDINGS: Bones/Joint/Cartilage No suspicious marrow signal abnormality. Preserved normal T1 marrow signal. No fracture or dislocation. Moderate bilateral knee joint effusions.  Ligaments The knee ligaments are not well evaluated due to field of view. Muscles and Tendons There is a small amount of edema in the distal right vastus medialis muscle. Additional mild edema in the right greater than left mid to distal vastus lateralis muscles, with adjacent fluid along the lateral fascia of the vastus lateralis muscles. The visualized tendons are intact. Soft tissues Deep soft tissue ulceration along the right anteromedial knee extending near  the medial patellar retinaculum and proximal MCL. No drainable fluid collection. IMPRESSION: 1. Deep soft tissue ulceration along the right anteromedial knee extending near the medial patellar retinaculum and proximal MCL. No evidence of osteomyelitis. No drainable fluid collection. 2. Small amount of edema within the distal right vastus medialis muscle, nonspecific, but possibly representing infectious myositis given proximity to the right knee wound. 3. Additional mild edema and adjacent fascial fluid involving the right greater than left mid to distal vastus lateralis muscles, felt unlikely to be infectious in etiology given relative symmetric involvement of both legs. 4. Moderate bilateral knee joint effusions, nonspecific. Again, the involvement of both knee joints suggests these are likely noninfectious, however, given the proximity of the right knee joint to the open right knee wound, consider right knee aspiration to rule out septic arthritis. Electronically Signed   By: Titus Dubin M.D.   On: 08/29/2017 16:12       Discharge Exam: Vitals:   09/04/17 0514 09/04/17 0907  BP: (!) 184/80 (!) 177/80  Pulse: 80 81  Resp: 19 19  Temp: 98.1 F (36.7 C) 98.2 F (36.8 C)  SpO2: 100% 100%   Vitals:   09/03/17 2216 09/03/17 2253 09/04/17 0514 09/04/17 0907  BP: 130/70  (!) 184/80 (!) 177/80  Pulse: 97  80 81  Resp:   19 19  Temp: 98.3 F (36.8 C)  98.1 F (36.7 C) 98.2 F (36.8 C)  TempSrc:    Oral  SpO2: 97%  100% 100%  Weight:   103 kg (227 lb 1.2 oz)    Height:        General: Pt is alert, awake, not in acute distress Cardiovascular: RRR, S1/S2 +, no rubs, no gallops Respiratory: CTA bilaterally, no wheezing, no rhonchi Abdominal: Soft, NT, ND, bowel sounds + Extremities: no edema, no cyanosis    The results of significant diagnostics from this hospitalization (including imaging, microbiology, ancillary and laboratory) are listed below for reference.     Microbiology: Recent Results (from the past 240 hour(s))  Blood Culture (routine x 2)     Status: None   Collection Time: 08/29/17 12:55 PM  Result Value Ref Range Status   Specimen Description BLOOD RIGHT ANTECUBITAL  Final   Special Requests   Final    BOTTLES DRAWN AEROBIC AND ANAEROBIC Blood Culture adequate volume   Culture NO GROWTH 5 DAYS  Final   Report Status 09/03/2017 FINAL  Final  Blood Culture (routine x 2)     Status: None   Collection Time: 08/29/17  1:10 PM  Result Value Ref Range Status   Specimen Description BLOOD LEFT WRIST  Final   Special Requests   Final    BOTTLES DRAWN AEROBIC AND ANAEROBIC Blood Culture adequate volume   Culture NO GROWTH 5 DAYS  Final   Report Status 09/03/2017 FINAL  Final  Respiratory Panel by PCR     Status: None   Collection Time: 08/29/17  4:39 PM  Result Value Ref Range Status   Adenovirus NOT DETECTED NOT DETECTED Final   Coronavirus 229E NOT DETECTED NOT DETECTED Final   Coronavirus HKU1 NOT DETECTED NOT DETECTED Final   Coronavirus NL63 NOT DETECTED NOT DETECTED Final   Coronavirus OC43 NOT DETECTED NOT DETECTED Final   Metapneumovirus NOT DETECTED NOT DETECTED Final   Rhinovirus / Enterovirus NOT DETECTED NOT DETECTED Final   Influenza A NOT DETECTED NOT DETECTED Final   Influenza B NOT DETECTED NOT DETECTED Final   Parainfluenza Virus 1  NOT DETECTED NOT DETECTED Final   Parainfluenza Virus 2 NOT DETECTED NOT DETECTED Final   Parainfluenza Virus 3 NOT DETECTED NOT DETECTED Final    Parainfluenza Virus 4 NOT DETECTED NOT DETECTED Final   Respiratory Syncytial Virus NOT DETECTED NOT DETECTED Final   Bordetella pertussis NOT DETECTED NOT DETECTED Final   Chlamydophila pneumoniae NOT DETECTED NOT DETECTED Final   Mycoplasma pneumoniae NOT DETECTED NOT DETECTED Final  MRSA PCR Screening     Status: None   Collection Time: 08/29/17  6:50 PM  Result Value Ref Range Status   MRSA by PCR NEGATIVE NEGATIVE Final    Comment:        The GeneXpert MRSA Assay (FDA approved for NASAL specimens only), is one component of a comprehensive MRSA colonization surveillance program. It is not intended to diagnose MRSA infection nor to guide or monitor treatment for MRSA infections.      Labs: BNP (last 3 results) No results for input(s): BNP in the last 8760 hours. Basic Metabolic Panel:  Recent Labs Lab 08/29/17 1255 08/29/17 1756 08/30/17 0403 08/31/17 0301 09/01/17 0218 09/02/17 0218  NA 131*  --  133* 134* 135 134*  K 4.2  --  3.5 3.6 3.8 3.5  CL 102  --  106 107 107 105  CO2 18*  --  16* 18* 19* 21*  GLUCOSE 127*  --  89 116* 100* 122*  BUN 40*  --  34* 23* 21* 15  CREATININE 1.61*  --  1.31* 1.13* 1.09* 1.13*  CALCIUM 9.7  --  8.9 8.8* 8.9 8.8*  MG  --  1.8  --   --   --   --    Liver Function Tests:  Recent Labs Lab 08/29/17 1255 08/30/17 0403  AST 24 22  ALT 15 13*  ALKPHOS 65 54  BILITOT 0.6 0.6  PROT 7.2 5.7*  ALBUMIN 3.4* 2.6*   No results for input(s): LIPASE, AMYLASE in the last 168 hours. No results for input(s): AMMONIA in the last 168 hours. CBC:  Recent Labs Lab 08/29/17 1255  08/31/17 0301 09/01/17 0218 09/02/17 0218 09/02/17 1409 09/03/17 0155 09/04/17 0346  WBC 33.6*  < > 11.5* 10.5 10.9*  --  14.6* 18.2*  NEUTROABS 31.9*  --  10.0* 8.2* 8.3*  --  13.1*  --   HGB 9.1*  < > 8.1* 8.2* 8.0* 8.6* 8.7* 8.0*  HCT 29.8*  < > 27.4* 25.9* 25.8* 28.4* 29.0* 26.4*  MCV 83.5  < > 84.3 82.5 83.8  --  82.9 83.0  PLT 302  < > 220  PLATELET CLUMPS NOTED ON SMEAR, UNABLE TO ESTIMATE 239  --  284 338  < > = values in this interval not displayed. Cardiac Enzymes:  Recent Labs Lab 08/29/17 1756  CKTOTAL 81   BNP: Invalid input(s): POCBNP CBG:  Recent Labs Lab 08/30/17 1123  GLUCAP 108*   D-Dimer No results for input(s): DDIMER in the last 72 hours. Hgb A1c No results for input(s): HGBA1C in the last 72 hours. Lipid Profile No results for input(s): CHOL, HDL, LDLCALC, TRIG, CHOLHDL, LDLDIRECT in the last 72 hours. Thyroid function studies No results for input(s): TSH, T4TOTAL, T3FREE, THYROIDAB in the last 72 hours.  Invalid input(s): FREET3 Anemia work up No results for input(s): VITAMINB12, FOLATE, FERRITIN, TIBC, IRON, RETICCTPCT in the last 72 hours. Urinalysis    Component Value Date/Time   COLORURINE YELLOW 08/29/2017 1319   APPEARANCEUR CLEAR 08/29/2017 1319   LABSPEC 1.017  08/29/2017 1319   PHURINE 5.0 08/29/2017 1319   GLUCOSEU NEGATIVE 08/29/2017 1319   HGBUR NEGATIVE 08/29/2017 1319   BILIRUBINUR NEGATIVE 08/29/2017 [place] 08/29/2017 1319   PROTEINUR 30 (A) 08/29/2017 1319   NITRITE NEGATIVE 08/29/2017 1319   LEUKOCYTESUR NEGATIVE 08/29/2017 1319   Sepsis Labs Invalid input(s): PROCALCITONIN,  WBC,  LACTICIDVEN Microbiology Recent Results (from the past 240 hour(s))  Blood Culture (routine x 2)     Status: None   Collection Time: 08/29/17 12:55 PM  Result Value Ref Range Status   Specimen Description BLOOD RIGHT ANTECUBITAL  Final   Special Requests   Final    BOTTLES DRAWN AEROBIC AND ANAEROBIC Blood Culture adequate volume   Culture NO GROWTH 5 DAYS  Final   Report Status 09/03/2017 FINAL  Final  Blood Culture (routine x 2)     Status: None   Collection Time: 08/29/17  1:10 PM  Result Value Ref Range Status   Specimen Description BLOOD LEFT WRIST  Final   Special Requests   Final    BOTTLES DRAWN AEROBIC AND ANAEROBIC Blood Culture adequate volume    Culture NO GROWTH 5 DAYS  Final   Report Status 09/03/2017 FINAL  Final  Respiratory Panel by PCR     Status: None   Collection Time: 08/29/17  4:39 PM  Result Value Ref Range Status   Adenovirus NOT DETECTED NOT DETECTED Final   Coronavirus 229E NOT DETECTED NOT DETECTED Final   Coronavirus HKU1 NOT DETECTED NOT DETECTED Final   Coronavirus NL63 NOT DETECTED NOT DETECTED Final   Coronavirus OC43 NOT DETECTED NOT DETECTED Final   Metapneumovirus NOT DETECTED NOT DETECTED Final   Rhinovirus / Enterovirus NOT DETECTED NOT DETECTED Final   Influenza A NOT DETECTED NOT DETECTED Final   Influenza B NOT DETECTED NOT DETECTED Final   Parainfluenza Virus 1 NOT DETECTED NOT DETECTED Final   Parainfluenza Virus 2 NOT DETECTED NOT DETECTED Final   Parainfluenza Virus 3 NOT DETECTED NOT DETECTED Final   Parainfluenza Virus 4 NOT DETECTED NOT DETECTED Final   Respiratory Syncytial Virus NOT DETECTED NOT DETECTED Final   Bordetella pertussis NOT DETECTED NOT DETECTED Final   Chlamydophila pneumoniae NOT DETECTED NOT DETECTED Final   Mycoplasma pneumoniae NOT DETECTED NOT DETECTED Final  MRSA PCR Screening     Status: None   Collection Time: 08/29/17  6:50 PM  Result Value Ref Range Status   MRSA by PCR NEGATIVE NEGATIVE Final    Comment:        The GeneXpert MRSA Assay (FDA approved for NASAL specimens only), is one component of a comprehensive MRSA colonization surveillance program. It is not intended to diagnose MRSA infection nor to guide or monitor treatment for MRSA infections.      Time coordinating discharge: Over 30 minutes  SIGNED:   Debbe Odea, MD  [facility] 09/04/2017, 11:18 AM Pager   If 7PM-7AM, please contact night-coverage [email or web address] Password [email or web address]

## 2017-09-04 NOTE — Discharge Instructions (Signed)
Please check BP at least twice a day and take Hydralazine if SBP > 140 or DBP > 90.   Please take all your medications with you for your next visit with your Primary MD. Please request your Primary MD to go over all hospital test results at the follow up. Please ask your Primary MD to get all Hospital records sent to his/her office.  If you experience worsening of your admission symptoms, develop shortness of breath, chest pain, suicidal or homicidal thoughts or a life threatening emergency, you must seek medical attention immediately by calling 911 or calling your MD.  Dennis Bast must read the complete instructions/literature along with all the possible adverse reactions/side effects for all the medicines you take including new medications that have been prescribed to you. Take new medicines after you have completely understood and accpet all the possible adverse reactions/side effects.   Do not drive when taking pain medications or sedatives.    Do not take more than prescribed Pain, Sleep and Anxiety Medications  If you have smoked or chewed Tobacco in the last 2 yrs please stop. Stop any regular alcohol and or recreational drug use.  Wear Seat belts while driving.

## 2017-09-05 ENCOUNTER — Telehealth (INDEPENDENT_AMBULATORY_CARE_PROVIDER_SITE_OTHER): Payer: Self-pay | Admitting: Orthopedic Surgery

## 2017-09-05 DIAGNOSIS — L89612 Pressure ulcer of right heel, stage 2: Secondary | ICD-10-CM | POA: Diagnosis not present

## 2017-09-05 DIAGNOSIS — I739 Peripheral vascular disease, unspecified: Secondary | ICD-10-CM | POA: Diagnosis not present

## 2017-09-05 DIAGNOSIS — I129 Hypertensive chronic kidney disease with stage 1 through stage 4 chronic kidney disease, or unspecified chronic kidney disease: Secondary | ICD-10-CM | POA: Diagnosis not present

## 2017-09-05 DIAGNOSIS — I872 Venous insufficiency (chronic) (peripheral): Secondary | ICD-10-CM | POA: Diagnosis not present

## 2017-09-05 DIAGNOSIS — L97221 Non-pressure chronic ulcer of left calf limited to breakdown of skin: Secondary | ICD-10-CM | POA: Diagnosis not present

## 2017-09-05 DIAGNOSIS — S81001D Unspecified open wound, right knee, subsequent encounter: Secondary | ICD-10-CM | POA: Diagnosis not present

## 2017-09-05 NOTE — Telephone Encounter (Signed)
Shelby Reese from [facility] called wanting to clarify the wound care orders. CB # [phone]

## 2017-09-05 NOTE — Telephone Encounter (Signed)
Advised silver aliginate, due to copious drainage. Will update next Wednesday after appointment. Advised open to suggestions due to patients heavy drainage if wound care has something else they recommend.

## 2017-09-05 NOTE — Progress Notes (Signed)
Patient Discharge: Disposition: Patient discharged to home. Education: Reviewed medication, prescription, follow-up appointment, verbalized understanding. IV: Discontinued IV before discharged. Telemetry: N/A Transportation: Patient escorted out of the unit in w/c to the ride. Belongings: Patient took all her belongings with her.

## 2017-09-08 DIAGNOSIS — L97221 Non-pressure chronic ulcer of left calf limited to breakdown of skin: Secondary | ICD-10-CM | POA: Diagnosis not present

## 2017-09-08 DIAGNOSIS — I129 Hypertensive chronic kidney disease with stage 1 through stage 4 chronic kidney disease, or unspecified chronic kidney disease: Secondary | ICD-10-CM | POA: Diagnosis not present

## 2017-09-08 DIAGNOSIS — I872 Venous insufficiency (chronic) (peripheral): Secondary | ICD-10-CM | POA: Diagnosis not present

## 2017-09-08 DIAGNOSIS — L89612 Pressure ulcer of right heel, stage 2: Secondary | ICD-10-CM | POA: Diagnosis not present

## 2017-09-08 DIAGNOSIS — I739 Peripheral vascular disease, unspecified: Secondary | ICD-10-CM | POA: Diagnosis not present

## 2017-09-08 DIAGNOSIS — S81001D Unspecified open wound, right knee, subsequent encounter: Secondary | ICD-10-CM | POA: Diagnosis not present

## 2017-09-09 ENCOUNTER — Telehealth (INDEPENDENT_AMBULATORY_CARE_PROVIDER_SITE_OTHER): Payer: Self-pay | Admitting: Orthopedic Surgery

## 2017-09-09 ENCOUNTER — Emergency Department (HOSPITAL_COMMUNITY)
Admission: EM | Admit: 2017-09-09 | Discharge: 2017-09-09 | Disposition: A | Discharge status: Home / Self Care | Payer: Medicare Other | Attending: Emergency Medicine | Admitting: Emergency Medicine

## 2017-09-09 ENCOUNTER — Encounter (HOSPITAL_COMMUNITY): Payer: Self-pay | Admitting: Emergency Medicine

## 2017-09-09 DIAGNOSIS — I10 Essential (primary) hypertension: Secondary | ICD-10-CM

## 2017-09-09 DIAGNOSIS — Z79899 Other long term (current) drug therapy: Secondary | ICD-10-CM | POA: Insufficient documentation

## 2017-09-09 DIAGNOSIS — L89612 Pressure ulcer of right heel, stage 2: Secondary | ICD-10-CM | POA: Diagnosis not present

## 2017-09-09 DIAGNOSIS — Z91040 Latex allergy status: Secondary | ICD-10-CM | POA: Insufficient documentation

## 2017-09-09 DIAGNOSIS — L97221 Non-pressure chronic ulcer of left calf limited to breakdown of skin: Secondary | ICD-10-CM | POA: Diagnosis not present

## 2017-09-09 DIAGNOSIS — I739 Peripheral vascular disease, unspecified: Secondary | ICD-10-CM | POA: Diagnosis not present

## 2017-09-09 DIAGNOSIS — L299 Pruritus, unspecified: Secondary | ICD-10-CM | POA: Diagnosis not present

## 2017-09-09 DIAGNOSIS — Z87891 Personal history of nicotine dependence: Secondary | ICD-10-CM | POA: Insufficient documentation

## 2017-09-09 DIAGNOSIS — R454 Irritability and anger: Secondary | ICD-10-CM | POA: Diagnosis not present

## 2017-09-09 DIAGNOSIS — Z7982 Long term (current) use of aspirin: Secondary | ICD-10-CM | POA: Diagnosis not present

## 2017-09-09 DIAGNOSIS — E039 Hypothyroidism, unspecified: Secondary | ICD-10-CM | POA: Diagnosis not present

## 2017-09-09 DIAGNOSIS — N183 Chronic kidney disease, stage 3 (moderate): Secondary | ICD-10-CM | POA: Insufficient documentation

## 2017-09-09 DIAGNOSIS — R21 Rash and other nonspecific skin eruption: Secondary | ICD-10-CM | POA: Diagnosis not present

## 2017-09-09 DIAGNOSIS — I129 Hypertensive chronic kidney disease with stage 1 through stage 4 chronic kidney disease, or unspecified chronic kidney disease: Secondary | ICD-10-CM | POA: Insufficient documentation

## 2017-09-09 DIAGNOSIS — S81001D Unspecified open wound, right knee, subsequent encounter: Secondary | ICD-10-CM | POA: Diagnosis not present

## 2017-09-09 DIAGNOSIS — I872 Venous insufficiency (chronic) (peripheral): Secondary | ICD-10-CM | POA: Diagnosis not present

## 2017-09-09 LAB — COMPREHENSIVE METABOLIC PANEL
ALBUMIN: 3.9 g/dL (ref 3.5–5.0)
ALT: 24 U/L (ref 14–54)
ANION GAP: 13 (ref 5–15)
AST: 19 U/L (ref 15–41)
Alkaline Phosphatase: 61 U/L (ref 38–126)
BILIRUBIN TOTAL: 0.6 mg/dL (ref 0.3–1.2)
BUN: 28 mg/dL — ABNORMAL HIGH (ref 6–20)
CALCIUM: 9.9 mg/dL (ref 8.9–10.3)
CO2: 24 mmol/L (ref 22–32)
Chloride: 100 mmol/L — ABNORMAL LOW (ref 101–111)
Creatinine, Ser: 1.17 mg/dL — ABNORMAL HIGH (ref 0.44–1.00)
GFR, EST AFRICAN AMERICAN: 50 mL/min — AB (ref >60)
GFR, EST NON AFRICAN AMERICAN: 43 mL/min — AB (ref >60)
GLUCOSE: 134 mg/dL — AB (ref 65–99)
POTASSIUM: 3.9 mmol/L (ref 3.5–5.1)
Sodium: 137 mmol/L (ref 135–145)
Total Protein: 8 g/dL (ref 6.5–8.1)

## 2017-09-09 LAB — CBC WITH DIFFERENTIAL/PLATELET
BASOS PCT: 0 %
Basophils Absolute: 0 10*3/uL (ref 0.0–0.1)
Eosinophils Absolute: 0.1 10*3/uL (ref 0.0–0.7)
Eosinophils Relative: 0 %
HEMATOCRIT: 34 % — AB (ref 36.0–46.0)
HEMOGLOBIN: 10.3 g/dL — AB (ref 12.0–15.0)
Lymphocytes Relative: 8 %
Lymphs Abs: 1.4 10*3/uL (ref 0.7–4.0)
MCH: 25.6 pg — AB (ref 26.0–34.0)
MCHC: 30.3 g/dL (ref 30.0–36.0)
MCV: 84.4 fL (ref 78.0–100.0)
MONO ABS: 0.8 10*3/uL (ref 0.1–1.0)
MONOS PCT: 4 %
NEUTROS ABS: 15.7 10*3/uL — AB (ref 1.7–7.7)
Neutrophils Relative %: 88 %
Platelets: 478 10*3/uL — ABNORMAL HIGH (ref 150–400)
RBC: 4.03 MIL/uL (ref 3.87–5.11)
RDW: 15.8 % — AB (ref 11.5–15.5)
WBC: 18 10*3/uL — ABNORMAL HIGH (ref 4.0–10.5)

## 2017-09-09 LAB — TSH: TSH: 0.298 u[IU]/mL — AB (ref 0.350–4.500)

## 2017-09-09 MED ORDER — HYDRALAZINE HCL 20 MG/ML IJ SOLN
10.0000 mg | Freq: Once | INTRAMUSCULAR | Status: AC
  Administered 2017-09-09: 10 mg via INTRAVENOUS
  Filled 2017-09-09: qty 1

## 2017-09-09 MED ORDER — METHYLPREDNISOLONE SODIUM SUCC 125 MG IJ SOLR
125.0000 mg | Freq: Once | INTRAMUSCULAR | Status: AC
  Administered 2017-09-09: 125 mg via INTRAVENOUS
  Filled 2017-09-09: qty 2

## 2017-09-09 NOTE — ED Provider Notes (Signed)
[facility] DEPT Provider Note   CSN: [id number] Arrival date & time: 09/09/17  1711     History   Chief Complaint Chief Complaint  Patient presents with  . Hypertension    HPI Shelby Reese is a 82 y.o. female.  The history is provided by the patient and medical records. No language interpreter was used.  Hypertension  This is a chronic problem. The current episode started more than 1 week ago. The problem occurs constantly. The problem has not changed since onset.Pertinent negatives include no chest pain, no abdominal pain, no headaches and no shortness of breath. Nothing aggravates the symptoms. Nothing relieves the symptoms. She has tried nothing for the symptoms. The treatment provided no relief.    Past Medical History:  Diagnosis Date  . Allergy   . Anemia   . CKD (chronic kidney disease), stage II   . Fibromyalgia   . GERD (gastroesophageal reflux disease)   . Hiatal hernia   . Hypertension   . Hypothyroid   . Lymphedema    venous insufficency  . Osteoarthritis   . Peripheral vascular disease ([place])   . Ulcer of knee ([place])    right  . Urinary incontinence   . Varicose veins   . Venous insufficiency     Patient Active Problem List   Diagnosis Date Noted  . Allergic drug rash   . Sepsis ([place]) 08/29/2017  . CKD (chronic kidney disease), stage III ([place]) 08/29/2017  . Hypothyroid 08/29/2017  . Hypertension 08/29/2017  . Peripheral vascular disease ([place]) 08/29/2017  . History of necrotic Ulcer of right knee s/p debridement and VAC tx 08/29/2017  . Acute kidney injury ([place]) 08/29/2017  . Metabolic acidosis 58/07/9832  . Acute hyponatremia 08/29/2017  . Pressure injury of skin 07/25/2017  . Wound, open, knee, lower leg, or ankle with complication, right, initial encounter   . Idiopathic chronic venous hypertension of both lower extremities with ulcer and inflammation ([place]) 07/15/2017  . Skin ulcer of right knee with necrosis of  muscle ([place]) 07/15/2017  . Atherosclerosis of artery of right lower extremity ([place]) 07/11/2017  . Essential hypertension 07/11/2017  . Osteoporosis 07/11/2017  . Cataracts, bilateral 07/11/2017  . Hypothyroidism, acquired 12/27/2014  . Anemia, iron deficiency 12/27/2014  . Hiatal hernia 12/27/2014  . Varicose veins of lower extremities with other complications 82/50/5397    Past Surgical History:  Procedure Laterality Date  . APPLICATION OF WOUND VAC Right 07/18/2017   Procedure: APPLICATION OF WOUND VAC;  Surgeon: [name], MD;  Location: [facility];  Service: Orthopedics;  Laterality: Right;  . CATARACT EXTRACTION W/ INTRAOCULAR LENS  IMPLANT, BILATERAL    . COLONOSCOPY    . I&D EXTREMITY Right 07/18/2017   Procedure: IRRIGATION AND DEBRIDEMENT RIGHT KNEE;  Surgeon: [name], MD;  Location: [facility];  Service: Orthopedics;  Laterality: Right;  . I&D EXTREMITY Right 07/23/2017   Procedure: REPEAT IRRIGATION AND DEBRIDEMENT RIGHT KNEE;  Surgeon: [name], MD;  Location: [facility];  Service: Orthopedics;  Laterality: Right;  . KNEE ARTHROSCOPY Left    menisectomy  . MULTIPLE TOOTH EXTRACTIONS    . SKIN SPLIT GRAFT Right 07/25/2017   Procedure: Repeat Irrigation and Debridement Right Knee, Split Thickness Skin Graft;  Surgeon: [name], MD;  Location: [facility];  Service: Orthopedics;  Laterality: Right;  . TONSILLECTOMY      OB History    No data available       Home Medications  Prior to Admission medications   Medication Sig Start Date End Date Taking? Authorizing Provider  acetaminophen (TYLENOL) 500 MG tablet Take 1,000 mg by mouth daily.   Yes [provider]  aspirin EC 81 MG tablet Take 81 mg by mouth daily with breakfast.   Yes [provider]  benazepril (LOTENSIN) 40 MG tablet Take 40 mg by mouth daily with breakfast.  03/26/13  Yes [provider]  Calcium Carbonate-Vitamin D (CALCIUM 600+D) 600-400 MG-UNIT per tablet Take 1 tablet by  mouth daily at 3 pm.    Yes [provider]  cetirizine (ZYRTEC) 10 MG tablet Take 10 mg by mouth at bedtime.    Yes [provider]  diltiazem (TIAZAC) 120 MG 24 hr capsule Take 120 mg by mouth daily with breakfast.  01/29/13  Yes [provider]  diphenhydrAMINE (BENADRYL) 25 mg capsule Take 50 mg by mouth every 6 (six) hours.    Yes [provider]  famotidine (PEPCID) 40 MG tablet Take 40 mg by mouth daily at 3 pm. 1600   Yes [provider]  ferrous sulfate 325 (65 FE) MG tablet Take 1 tablet (325 mg total) by mouth 2 (two) times daily with a meal. 09/04/17  Yes Rizwan, Eunice Blase, MD  Gauze Pads & Dressings (KERLIX GAUZE ROLL LARGE) MISC 1 Units by Does not apply route 4 (four) times daily. 07/14/17  Yes Suzan Slick, NP  Gauze Pads & Dressings 4"X4-1/2" PADS 2 Units by Does not apply route 4 (four) times daily. 07/14/17  Yes Suzan Slick, NP  hydrALAZINE (APRESOLINE) 25 MG tablet Take 1 tablet (25 mg total) by mouth every 8 (eight) hours. 09/04/17  Yes Debbe Odea, MD  hydrocortisone cream 0.5 % Apply topically 3 (three) times daily. 09/04/17  Yes Debbe Odea, MD  ibuprofen (ADVIL,MOTRIN) 200 MG tablet Take 400 mg by mouth daily.   Yes [provider]  Magnesium Oxide (MAG-200 PO) Take 200 mg by mouth daily at 3 pm.   Yes [provider]  Multiple Vitamin (MULTIVITAMIN WITH MINERALS) TABS tablet Take 1 tablet by mouth daily.   Yes [provider]  NATURAL PSYLLIUM FIBER PO Take 1 capsule by mouth daily.   Yes [provider]  predniSONE (DELTASONE) 20 MG tablet Take 2 tablets (40 mg total) by mouth daily with breakfast. 40 mg on 10/17, 20 mg on 10/18, 10 mg on 10/19 and then 5mg  on 10/20 09/05/17  Yes Rizwan, Saima, MD  silver sulfADIAZINE (SILVADENE) 1 % cream Apply 1 application topically daily. 09/04/17  Yes Debbe Odea, MD  SYNTHROID 175 MCG tablet Take 175 mcg by mouth daily before breakfast.  09/20/15  Yes  [provider]  vitamin C (ASCORBIC ACID) 500 MG tablet Take 500 mg by mouth daily.   Yes [provider]    Family History Family History  Problem Relation Age of Onset  . Stroke Mother   . Varicose Veins Mother   . Cancer Father        prostate  . Stroke Sister   . Heart disease Sister   . Varicose Veins Sister   . Stroke Maternal Grandmother   . Varicose Veins Sister     Social History Social History  Substance Use Topics  . Smoking status: Former Smoker    Packs/day: 0.50    Years: 10.00    Quit date: 11/19/1979  . Smokeless tobacco: Never Used  . Alcohol use 8.4 oz/week    14 Glasses  of wine per week     Comment: 2 glasses of wine with dinner     Allergies   Bactrim [sulfamethoxazole-trimethoprim]; Ciprofloxacin; Diovan [valsartan]; Food; Latex; Nitrofuran derivatives; Penicillins; Sulfa antibiotics; and Other   Review of Systems Review of Systems  Constitutional: Negative for chills, diaphoresis, fatigue and fever.  Eyes: Negative for photophobia and visual disturbance.  Respiratory: Negative for apnea, cough, choking, chest tightness and shortness of breath.   Cardiovascular: Negative for chest pain.  Gastrointestinal: Negative for abdominal pain, constipation, diarrhea, nausea and vomiting.  Genitourinary: Negative for dysuria and flank pain.  Musculoskeletal: Negative for neck pain.  Skin: Positive for rash.  Neurological: Negative for weakness, light-headedness and headaches.  Psychiatric/Behavioral: Negative for agitation and confusion.  All other systems reviewed and are negative.    Physical Exam Updated Vital Signs BP (!) 217/86 (BP Location: Left Arm)   Pulse 85   Temp 98.3 F (36.8 C) (Oral)   Resp 20   SpO2 99%   Physical Exam  Constitutional: She is oriented to person, place, and time. She appears well-developed and well-nourished. No distress.  HENT:  Head: Normocephalic.  Mouth/Throat: Oropharynx is clear and moist.  No oropharyngeal exudate.  Eyes: Pupils are equal, round, and reactive to light. Conjunctivae and EOM are normal.  Neck: Normal range of motion.  Cardiovascular: Normal rate and intact distal pulses.   No murmur heard. Pulmonary/Chest: Effort normal. No stridor. No respiratory distress. She has no wheezes. She has no rales. She exhibits no tenderness.  Abdominal: Soft. Bowel sounds are normal. There is no tenderness.  Musculoskeletal: She exhibits no edema or tenderness.  Neurological: She is alert and oriented to person, place, and time. No sensory deficit. She exhibits normal muscle tone.  Skin: Skin is warm. Capillary refill takes less than 2 seconds. Rash noted. She is not diaphoretic. No erythema.  Psychiatric: She has a normal mood and affect.  Nursing note and vitals reviewed.    ED Treatments / Results  Labs (all labs ordered are listed, but only abnormal results are displayed) Labs Reviewed  CBC WITH DIFFERENTIAL/PLATELET - Abnormal; Notable for the following:       Result Value   WBC 18.0 (*)    Hemoglobin 10.3 (*)    HCT 34.0 (*)    MCH 25.6 (*)    RDW 15.8 (*)    Platelets 478 (*)    Neutro Abs 15.7 (*)    All other components within normal limits  COMPREHENSIVE METABOLIC PANEL - Abnormal; Notable for the following:    Chloride 100 (*)    Glucose, Bld 134 (*)    BUN 28 (*)    Creatinine, Ser 1.17 (*)    GFR calc non Af Amer 43 (*)    GFR calc Af Amer 50 (*)    All other components within normal limits  TSH - Abnormal; Notable for the following:    TSH 0.298 (*)    All other components within normal limits    EKG  EKG Interpretation None       Radiology No results found.  Procedures Procedures (including critical care time)  Medications Ordered in ED Medications  methylPREDNISolone sodium succinate (SOLU-MEDROL) 125 mg/2 mL injection 125 mg (125 mg Intravenous Given 09/09/17 2147)  hydrALAZINE (APRESOLINE) injection 10 mg (10 mg Intravenous Given  09/09/17 2147)     Initial Impression / Assessment and Plan / ED Course  I have reviewed the triage vital signs and the nursing notes.  Pertinent  labs & imaging results that were available during my care of the patient were reviewed by me and considered in my medical decision making (see chart for details).     JULANN MCGILVRAY is a 82 y.o. female with a past medical history of thyroid disease, hypertension, fibromyalgia, chronic kidney disease, GERD, and right knee surgery who presents with high blood pressure, frustration, and pruritic rash.  Patient reports that she was visited by her home health nurse today who told her she has to come to the emergency department for evaluation of high blood pressure.  Patient reports that she was having no other elevated blood pressure symptoms.  Patient denied chest pain, shortness of breath, headache, nausea, vomiting, vision changes, neurologic deficits or urinary changes.  She reports that he did not want to come to the ED but the nurse insisted.  Patient does report that she has been on prednisone low-dose for the last few days because she has had an itching pruritic rash all over her body.  Patient reports that during a visit recently she had a latex blood pressure cuff utilized causing the reaction all over her body.  She denied any respiratory or airway compromise.  She denied difficulty breathing tongue swelling or lip swelling.  Patient says that the prednisone has not helped and she needs Solu-Medrol to be injected to help with the itching.  Patient says the itching is also agitating her making her blood pressure elevated.  On exam, patient was hypertensive on arrival with a blood pressure greater than 932 systolic.  Patient reports that she did not take her blood pressure medicine this afternoon because she was waiting in the emergency department for blood pressure evaluation.  She reports that she is very frustrated for having to wait in the emergency  department.  Patient's lungs were clear.  Chest was nontender.  No focal neurologic deficits were discovered.  Patient has edematous legs which he reports is unchanged from prior.  No leg tenderness or knee tenderness on my exam.  Patient refused bandage to be taken down of the knee and says that it is not infected.  Abdomen and chest were nontender.  No other abnormalities on exam.  Patient requests that blood work be drawn to look for anemia as she has had this in the past.  She insists that he needs a blood transfusion if it is less than 8.  Patient was advised that without symptoms, blood transfusion would likely not be performed today however a CBC given her recent anemia  CBC, CMP, and thyroid levelswere were drawn.  Patient also insistent on Solu-Medrol.  Given the rash and pruritus and the patient's feeling that her agitation and itching is leading to her elevated blood pressure, a shared decision making conversation was held.  Patient was advised that the increase in steroids can also increase blood pressure however she thinks that it will help.  1 dose of Solu-Medrol was given.  Patient was given hydralazine as is the medicine she has taken previously.  Patient's blood pressure began to improve.  Patient continued to get frustrated with the weight department.  Patient wanted to leave [facility] however, patient's blood work began to return.  Patient was advised that she had improving hemoglobin levels and would not need transfusion.  Patient did have a leukocytosis likely from her recent steroids.  Do not feel patient has infection based on my exam.  TSH had not returned by the time the patient insisted on leaving  the ED.  Patient reports that she will take her blood pressure medicine at home and understands the risks of leaving before blood pressure is completely controlled.  Patient is agitated and thinks that by resting at home her blood pressure will improve.  Patient advised  to follow-up with her primary care physician in the next few days to discuss further blood pressure management strategies.  Patient also advised to return to the emergency department if she had any new or worsening symptoms.  Patient voiced understanding of the plan of care and was discharged in good condition with improving blood pressure despite agitation.  Patient's blood pressure at discharge was in the 409W systolic on my evaluation.    Final Clinical Impressions(s) / ED Diagnoses   Final diagnoses:  Hypertension, unspecified type  Rash  Pruritus    New Prescriptions Discharge Medication List as of 09/09/2017 10:34 PM      Clinical Impression: 1. Hypertension, unspecified type   2. Rash   3. Pruritus     Disposition: Discharge  Condition: Good  I have discussed the results, Dx and Tx plan with the pt(& family if present). He/she/they expressed understanding and agree(s) with the plan. Discharge instructions discussed at great length. Strict return precautions discussed and pt &/or family have verbalized understanding of the instructions. No further questions at time of discharge.    Discharge Medication List as of 09/09/2017 10:34 PM      Follow Up: [name], [place] [address] [phone]  Schedule an appointment as soon as possible for a visit    [facility] DE[facility] [place] [id number] [place] [phone]  If symptoms worsen     Christion Leonhard, Gwenyth Allegra, MD 09/10/17 [phone]

## 2017-09-09 NOTE — Discharge Instructions (Signed)
Please continue your outpatient steroid regimen and your blood pressure medication.  I suspect the steroids increased her blood pressure today.  Please stay hydrated.  Please follow-up with your primary care physician in the next few days for reassessment.  If any symptoms change or worsen, please return to the nearest emergency department.

## 2017-09-09 NOTE — Telephone Encounter (Signed)
Yes they can use Medi honey for dressing changes.

## 2017-09-09 NOTE — ED Triage Notes (Signed)
Patient BP was high today when home health nurse was at home she made patient call her PCP who told patient to go to ED. Patient reports that she is very aggravated due to having a rash that "no one can figure out what is it coming from or will do anything about".

## 2017-09-09 NOTE — Telephone Encounter (Signed)
I called spoke with Shelby Reese ok for medi honey dressing, they have ordered, will do silvadene until it arrives.

## 2017-09-09 NOTE — Telephone Encounter (Signed)
Shelby Reese called asking for an approval on a wound care change, the nurse suggested applying medi-honey to help the slouth in her right knee. CB # [id number]

## 2017-09-10 ENCOUNTER — Ambulatory Visit (INDEPENDENT_AMBULATORY_CARE_PROVIDER_SITE_OTHER): Payer: Medicare Other | Admitting: Orthopedic Surgery

## 2017-09-10 ENCOUNTER — Encounter (INDEPENDENT_AMBULATORY_CARE_PROVIDER_SITE_OTHER): Payer: Self-pay | Admitting: Orthopedic Surgery

## 2017-09-10 DIAGNOSIS — L97919 Non-pressure chronic ulcer of unspecified part of right lower leg with unspecified severity: Secondary | ICD-10-CM

## 2017-09-10 DIAGNOSIS — L97929 Non-pressure chronic ulcer of unspecified part of left lower leg with unspecified severity: Secondary | ICD-10-CM

## 2017-09-10 DIAGNOSIS — L97813 Non-pressure chronic ulcer of other part of right lower leg with necrosis of muscle: Secondary | ICD-10-CM

## 2017-09-10 DIAGNOSIS — I87333 Chronic venous hypertension (idiopathic) with ulcer and inflammation of bilateral lower extremity: Secondary | ICD-10-CM | POA: Diagnosis not present

## 2017-09-10 NOTE — Progress Notes (Signed)
Office Visit Note   Patient: Shelby Reese           Date of Birth: 08-26-1935           MRN: [id number] Visit Date: 09/10/2017              Requested by: [name], MD [address] [place], [place] [address] PCP: [name], MD  Chief Complaint  Patient presents with  . Right Knee - Follow-up      HPI: Patient is a 82 year old woman who presents status post irrigation debridement right knee and application of split-thickness skin graft.  She is 47 days postoperatively.  She is currently having wound care nursing apply a alginate dressing.  She states that she has significant venous insufficiency that is chronic.  Of note patient states that she spent about 5 hours in the emergency room due to hypertension.  Assessment & Plan: Visit Diagnoses:  1. Skin ulcer of right knee with necrosis of muscle ([place])   2. Idiopathic chronic venous hypertension of both lower extremities with ulcer and inflammation ([place])     Plan: We will transition from the dressing changes to the Dynaflex compression wrap.  We will have her continue with the silver alginate dressing change to the knee wound and apply a Dynaflex wrap over the right lower extremity extending proximal to the knee to encompass the skin graft.  The importance of protein supplement was discussed with her using a whey based protein twice a day.  We will have home health nursing to the dressing changes twice a week.  Patient states that she cannot follow-up in 2 weeks but she will follow-up in 3 weeks.  Follow-Up Instructions: No Follow-up on file.   Ortho Exam  Patient is alert, oriented, no adenopathy, well-dressed, normal affect, normal respiratory effort. Examination patient's wound that is approximately 50% good granulation tissue with approximately 50% of skin graft.  There are some retained staples these will be removed.  There is no cellulitis no evidence no abscess no signs of infection she does have  brawny skin color changes with swelling of the entire right lower extremity.  No pain with passive range of motion of the ankle.  Imaging: No results found. No images are attached to the encounter.  Labs: Lab Results  Component Value Date   ESRSEDRATE 92 (H) 08/29/2017   REPTSTATUS 09/03/2017 FINAL 08/29/2017   CULT NO GROWTH 5 DAYS 08/29/2017   LABORGA No Salmonella,Shigella,Campylobacter,Yersinia,or 01/01/2015   LABORGA No E.coli 0157:H7 isolated. 01/01/2015    Orders:  No orders of the defined types were placed in this encounter.  No orders of the defined types were placed in this encounter.    Procedures: No procedures performed  Clinical Data: No additional findings.  ROS:  All other systems negative, except as noted in the HPI. Review of Systems  Objective: Vital Signs: There were no vitals taken for this visit.  Specialty Comments:  No specialty comments available.  PMFS History: Patient Active Problem List   Diagnosis Date Noted  . Allergic drug rash   . Sepsis ([place]) 08/29/2017  . CKD (chronic kidney disease), stage III ([place]) 08/29/2017  . Hypothyroid 08/29/2017  . Hypertension 08/29/2017  . Peripheral vascular disease ([place]) 08/29/2017  . History of necrotic Ulcer of right knee s/p debridement and VAC tx 08/29/2017  . Acute kidney injury ([place]) 08/29/2017  . Metabolic acidosis 63/87/5643  . Acute hyponatremia 08/29/2017  . Pressure injury of skin 07/25/2017  .  Wound, open, knee, lower leg, or ankle with complication, right, initial encounter   . Idiopathic chronic venous hypertension of both lower extremities with ulcer and inflammation ([place]) 07/15/2017  . Skin ulcer of right knee with necrosis of muscle ([place]) 07/15/2017  . Atherosclerosis of artery of right lower extremity ([place]) 07/11/2017  . Essential hypertension 07/11/2017  . Osteoporosis 07/11/2017  . Cataracts, bilateral 07/11/2017  . Hypothyroidism, acquired 12/27/2014  . Anemia, iron  deficiency 12/27/2014  . Hiatal hernia 12/27/2014  . Varicose veins of lower extremities with other complications 67/89/3810   Past Medical History:  Diagnosis Date  . Allergy   . Anemia   . CKD (chronic kidney disease), stage II   . Fibromyalgia   . GERD (gastroesophageal reflux disease)   . Hiatal hernia   . Hypertension   . Hypothyroid   . Lymphedema    venous insufficency  . Osteoarthritis   . Peripheral vascular disease ([place])   . Ulcer of knee ([place])    right  . Urinary incontinence   . Varicose veins   . Venous insufficiency     Family History  Problem Relation Age of Onset  . Stroke Mother   . Varicose Veins Mother   . Cancer Father        prostate  . Stroke Sister   . Heart disease Sister   . Varicose Veins Sister   . Stroke Maternal Grandmother   . Varicose Veins Sister     Past Surgical History:  Procedure Laterality Date  . APPLICATION OF WOUND VAC Right 07/18/2017   Procedure: APPLICATION OF WOUND VAC;  Surgeon: [name], MD;  Location: [facility];  Service: Orthopedics;  Laterality: Right;  . CATARACT EXTRACTION W/ INTRAOCULAR LENS  IMPLANT, BILATERAL    . COLONOSCOPY    . I&D EXTREMITY Right 07/18/2017   Procedure: IRRIGATION AND DEBRIDEMENT RIGHT KNEE;  Surgeon: [name], MD;  Location: [facility];  Service: Orthopedics;  Laterality: Right;  . I&D EXTREMITY Right 07/23/2017   Procedure: REPEAT IRRIGATION AND DEBRIDEMENT RIGHT KNEE;  Surgeon: [name], MD;  Location: [facility];  Service: Orthopedics;  Laterality: Right;  . KNEE ARTHROSCOPY Left    menisectomy  . MULTIPLE TOOTH EXTRACTIONS    . SKIN SPLIT GRAFT Right 07/25/2017   Procedure: Repeat Irrigation and Debridement Right Knee, Split Thickness Skin Graft;  Surgeon: [name], MD;  Location: [facility];  Service: Orthopedics;  Laterality: Right;  . TONSILLECTOMY     Social History   Occupational History  .      retired Marine scientist.    Social History Main Topics  . Smoking status: Former Smoker     Packs/day: 0.50    Years: 10.00    Quit date: 11/19/1979  . Smokeless tobacco: Never Used  . Alcohol use 8.4 oz/week    14 Glasses of wine per week     Comment: 2 glasses of wine with dinner  . Drug use: No  . Sexual activity: Not on file

## 2017-09-12 DIAGNOSIS — S81001D Unspecified open wound, right knee, subsequent encounter: Secondary | ICD-10-CM | POA: Diagnosis not present

## 2017-09-12 DIAGNOSIS — I739 Peripheral vascular disease, unspecified: Secondary | ICD-10-CM | POA: Diagnosis not present

## 2017-09-12 DIAGNOSIS — I872 Venous insufficiency (chronic) (peripheral): Secondary | ICD-10-CM | POA: Diagnosis not present

## 2017-09-12 DIAGNOSIS — L97221 Non-pressure chronic ulcer of left calf limited to breakdown of skin: Secondary | ICD-10-CM | POA: Diagnosis not present

## 2017-09-12 DIAGNOSIS — I129 Hypertensive chronic kidney disease with stage 1 through stage 4 chronic kidney disease, or unspecified chronic kidney disease: Secondary | ICD-10-CM | POA: Diagnosis not present

## 2017-09-12 DIAGNOSIS — L89612 Pressure ulcer of right heel, stage 2: Secondary | ICD-10-CM | POA: Diagnosis not present

## 2017-09-15 DIAGNOSIS — L97221 Non-pressure chronic ulcer of left calf limited to breakdown of skin: Secondary | ICD-10-CM | POA: Diagnosis not present

## 2017-09-15 DIAGNOSIS — I872 Venous insufficiency (chronic) (peripheral): Secondary | ICD-10-CM | POA: Diagnosis not present

## 2017-09-15 DIAGNOSIS — I739 Peripheral vascular disease, unspecified: Secondary | ICD-10-CM | POA: Diagnosis not present

## 2017-09-15 DIAGNOSIS — L89612 Pressure ulcer of right heel, stage 2: Secondary | ICD-10-CM | POA: Diagnosis not present

## 2017-09-15 DIAGNOSIS — I129 Hypertensive chronic kidney disease with stage 1 through stage 4 chronic kidney disease, or unspecified chronic kidney disease: Secondary | ICD-10-CM | POA: Diagnosis not present

## 2017-09-15 DIAGNOSIS — S81001D Unspecified open wound, right knee, subsequent encounter: Secondary | ICD-10-CM | POA: Diagnosis not present

## 2017-09-18 DIAGNOSIS — I129 Hypertensive chronic kidney disease with stage 1 through stage 4 chronic kidney disease, or unspecified chronic kidney disease: Secondary | ICD-10-CM | POA: Diagnosis not present

## 2017-09-18 DIAGNOSIS — I739 Peripheral vascular disease, unspecified: Secondary | ICD-10-CM | POA: Diagnosis not present

## 2017-09-18 DIAGNOSIS — L97221 Non-pressure chronic ulcer of left calf limited to breakdown of skin: Secondary | ICD-10-CM | POA: Diagnosis not present

## 2017-09-18 DIAGNOSIS — S81001D Unspecified open wound, right knee, subsequent encounter: Secondary | ICD-10-CM | POA: Diagnosis not present

## 2017-09-18 DIAGNOSIS — I872 Venous insufficiency (chronic) (peripheral): Secondary | ICD-10-CM | POA: Diagnosis not present

## 2017-09-18 DIAGNOSIS — L89612 Pressure ulcer of right heel, stage 2: Secondary | ICD-10-CM | POA: Diagnosis not present

## 2017-09-22 ENCOUNTER — Telehealth (INDEPENDENT_AMBULATORY_CARE_PROVIDER_SITE_OTHER): Payer: Self-pay | Admitting: Radiology

## 2017-09-22 DIAGNOSIS — L89612 Pressure ulcer of right heel, stage 2: Secondary | ICD-10-CM | POA: Diagnosis not present

## 2017-09-22 DIAGNOSIS — I739 Peripheral vascular disease, unspecified: Secondary | ICD-10-CM | POA: Diagnosis not present

## 2017-09-22 DIAGNOSIS — I129 Hypertensive chronic kidney disease with stage 1 through stage 4 chronic kidney disease, or unspecified chronic kidney disease: Secondary | ICD-10-CM | POA: Diagnosis not present

## 2017-09-22 DIAGNOSIS — L97221 Non-pressure chronic ulcer of left calf limited to breakdown of skin: Secondary | ICD-10-CM | POA: Diagnosis not present

## 2017-09-22 DIAGNOSIS — S81001D Unspecified open wound, right knee, subsequent encounter: Secondary | ICD-10-CM | POA: Diagnosis not present

## 2017-09-22 DIAGNOSIS — I872 Venous insufficiency (chronic) (peripheral): Secondary | ICD-10-CM | POA: Diagnosis not present

## 2017-09-22 MED ORDER — HYDROCODONE-ACETAMINOPHEN 5-325 MG PO TABS: 1.0000 | ORAL_TABLET | Freq: Four times a day (QID) | ORAL | 0 refills | Status: DC | PRN

## 2017-09-22 NOTE — Telephone Encounter (Signed)
Call patient and see what pain meds she needs

## 2017-09-22 NOTE — Telephone Encounter (Signed)
Alexis [facility] from [facility] calling about patient. They are wondering if patient can have a rx for pain. Only taking tylenol now. Can you please advise?

## 2017-09-23 ENCOUNTER — Ambulatory Visit: Payer: Medicare Other | Admitting: Podiatry

## 2017-09-23 NOTE — Telephone Encounter (Signed)
I called and spoke with patient yesterday. Rx written for vicodin, she will have husband pick up tomorrow. Called Alexis back for verbal ok for nursing and PT, due to increased weakness.

## 2017-09-24 ENCOUNTER — Ambulatory Visit: Payer: Medicare Other | Admitting: Podiatry

## 2017-09-26 DIAGNOSIS — I739 Peripheral vascular disease, unspecified: Secondary | ICD-10-CM | POA: Diagnosis not present

## 2017-09-26 DIAGNOSIS — L89612 Pressure ulcer of right heel, stage 2: Secondary | ICD-10-CM | POA: Diagnosis not present

## 2017-09-26 DIAGNOSIS — S81001D Unspecified open wound, right knee, subsequent encounter: Secondary | ICD-10-CM | POA: Diagnosis not present

## 2017-09-26 DIAGNOSIS — I129 Hypertensive chronic kidney disease with stage 1 through stage 4 chronic kidney disease, or unspecified chronic kidney disease: Secondary | ICD-10-CM | POA: Diagnosis not present

## 2017-09-26 DIAGNOSIS — I872 Venous insufficiency (chronic) (peripheral): Secondary | ICD-10-CM | POA: Diagnosis not present

## 2017-09-26 DIAGNOSIS — L97221 Non-pressure chronic ulcer of left calf limited to breakdown of skin: Secondary | ICD-10-CM | POA: Diagnosis not present

## 2017-09-29 ENCOUNTER — Telehealth (INDEPENDENT_AMBULATORY_CARE_PROVIDER_SITE_OTHER): Payer: Self-pay | Admitting: Radiology

## 2017-09-29 DIAGNOSIS — I129 Hypertensive chronic kidney disease with stage 1 through stage 4 chronic kidney disease, or unspecified chronic kidney disease: Secondary | ICD-10-CM | POA: Diagnosis not present

## 2017-09-29 DIAGNOSIS — M60861 Other myositis, right lower leg: Secondary | ICD-10-CM | POA: Diagnosis not present

## 2017-09-29 DIAGNOSIS — I872 Venous insufficiency (chronic) (peripheral): Secondary | ICD-10-CM | POA: Diagnosis not present

## 2017-09-29 DIAGNOSIS — M1990 Unspecified osteoarthritis, unspecified site: Secondary | ICD-10-CM | POA: Diagnosis not present

## 2017-09-29 DIAGNOSIS — L89610 Pressure ulcer of right heel, unstageable: Secondary | ICD-10-CM | POA: Diagnosis not present

## 2017-09-29 DIAGNOSIS — I739 Peripheral vascular disease, unspecified: Secondary | ICD-10-CM | POA: Diagnosis not present

## 2017-09-29 DIAGNOSIS — M797 Fibromyalgia: Secondary | ICD-10-CM | POA: Diagnosis not present

## 2017-09-29 DIAGNOSIS — E039 Hypothyroidism, unspecified: Secondary | ICD-10-CM | POA: Diagnosis not present

## 2017-09-29 DIAGNOSIS — Z7982 Long term (current) use of aspirin: Secondary | ICD-10-CM | POA: Diagnosis not present

## 2017-09-29 DIAGNOSIS — I82619 Acute embolism and thrombosis of superficial veins of unspecified upper extremity: Secondary | ICD-10-CM | POA: Diagnosis not present

## 2017-09-29 DIAGNOSIS — L97211 Non-pressure chronic ulcer of right calf limited to breakdown of skin: Secondary | ICD-10-CM | POA: Diagnosis not present

## 2017-09-29 DIAGNOSIS — N183 Chronic kidney disease, stage 3 (moderate): Secondary | ICD-10-CM | POA: Diagnosis not present

## 2017-09-29 DIAGNOSIS — Z9181 History of falling: Secondary | ICD-10-CM | POA: Diagnosis not present

## 2017-09-29 DIAGNOSIS — D631 Anemia in chronic kidney disease: Secondary | ICD-10-CM | POA: Diagnosis not present

## 2017-09-29 NOTE — Telephone Encounter (Signed)
Alexis [facility] from [facility] calling about patient. Her right heel wound is now healed but right knee wound where she had surgical intervention remains stagnant. Wanted to know if Dr. Sharol Given would change wound care. Patient is also having bilateral lower extremity weeping edema, wanting recommendations. She most likely will have to go in compression wraps bilateral. However she would need to be seen in office first. Advised that our office will contact patient to move up her Wednesday appointment to tomorrow. This has be done.

## 2017-09-30 ENCOUNTER — Encounter (INDEPENDENT_AMBULATORY_CARE_PROVIDER_SITE_OTHER): Payer: Self-pay | Admitting: Orthopedic Surgery

## 2017-09-30 ENCOUNTER — Ambulatory Visit (INDEPENDENT_AMBULATORY_CARE_PROVIDER_SITE_OTHER): Payer: Medicare Other | Admitting: Orthopedic Surgery

## 2017-09-30 ENCOUNTER — Telehealth (INDEPENDENT_AMBULATORY_CARE_PROVIDER_SITE_OTHER): Payer: Self-pay | Admitting: Orthopedic Surgery

## 2017-09-30 DIAGNOSIS — I129 Hypertensive chronic kidney disease with stage 1 through stage 4 chronic kidney disease, or unspecified chronic kidney disease: Secondary | ICD-10-CM | POA: Diagnosis not present

## 2017-09-30 DIAGNOSIS — L97813 Non-pressure chronic ulcer of other part of right lower leg with necrosis of muscle: Secondary | ICD-10-CM | POA: Diagnosis not present

## 2017-09-30 DIAGNOSIS — L97919 Non-pressure chronic ulcer of unspecified part of right lower leg with unspecified severity: Secondary | ICD-10-CM

## 2017-09-30 DIAGNOSIS — L97211 Non-pressure chronic ulcer of right calf limited to breakdown of skin: Secondary | ICD-10-CM | POA: Diagnosis not present

## 2017-09-30 DIAGNOSIS — M60861 Other myositis, right lower leg: Secondary | ICD-10-CM | POA: Diagnosis not present

## 2017-09-30 DIAGNOSIS — L89610 Pressure ulcer of right heel, unstageable: Secondary | ICD-10-CM | POA: Diagnosis not present

## 2017-09-30 DIAGNOSIS — I87333 Chronic venous hypertension (idiopathic) with ulcer and inflammation of bilateral lower extremity: Secondary | ICD-10-CM

## 2017-09-30 DIAGNOSIS — L97929 Non-pressure chronic ulcer of unspecified part of left lower leg with unspecified severity: Secondary | ICD-10-CM

## 2017-09-30 DIAGNOSIS — I739 Peripheral vascular disease, unspecified: Secondary | ICD-10-CM | POA: Diagnosis not present

## 2017-09-30 DIAGNOSIS — I872 Venous insufficiency (chronic) (peripheral): Secondary | ICD-10-CM | POA: Diagnosis not present

## 2017-09-30 MED ORDER — DOXYCYCLINE HYCLATE 100 MG PO TABS: 100.0000 mg | ORAL_TABLET | Freq: Two times a day (BID) | ORAL | 0 refills | Status: DC

## 2017-09-30 NOTE — Telephone Encounter (Signed)
I did call Shelby Reese back advised patient placed in bilateral compression wraps in the office today, calcium aliginate over wound. [facility] to change twice a week. Advised patient started on doxycycline today.

## 2017-09-30 NOTE — Telephone Encounter (Signed)
Shelby Reese with [facility] called asked if she can be contacted after patient's appointment today. The number to contact Shelby Reese is [phone]

## 2017-09-30 NOTE — Telephone Encounter (Signed)
Will call Alexis back after patients appointment today.

## 2017-09-30 NOTE — Progress Notes (Signed)
Office Visit Note   Patient: Shelby Reese           Date of Birth: 1935/03/31           MRN: [id number] Visit Date: 09/30/2017              Requested by: [name], MD [address] [place], [place] [address] PCP: [name], MD  No chief complaint on file.     HPI: Patient is a 82 year old woman who presents complaining of increasing pain and swelling from her venous insufficiency with both lower extremities.  Patient has been doing dressing changes at home with only a dressing change around the knee and not incorporating either leg.  She complains of weeping clear edema from both legs.  Assessment & Plan: Visit Diagnoses:  1. Skin ulcer of right knee with necrosis of muscle ([place])   2. Idiopathic chronic venous hypertension of both lower extremities with ulcer and inflammation ([place])     Plan: We will call advanced home [facility] patient will need compression wraps applied to both lower extremities twice a week.  The right wrap needs to extend above the knee.  We will start on doxycycline due to the pain in both legs with the edema and redness and swelling.  Follow-Up Instructions: Return in about 2 weeks (around 10/14/2017).   Ortho Exam  Patient is alert, oriented, no adenopathy, well-dressed, normal affect, normal respiratory effort. Examination patient is ambulating in a wheelchair.  Patient has brawny edema in both legs with weeping edema in both calves with clear weeping fluid.  There is tenderness to palpation there is redness dermatitis.  There is no purulence.  Examination the wound bed of the right knee shows 75% beefy granulation tissue 25% split-thickness skin graft which has not incorporated.  The wound has no depth no odor no exposed bone or tendon.  The wound is approximately 15 cm x 5 cm and 3 mm deep.  Imaging: No results found. No images are attached to the encounter.  Labs: Lab Results  Component Value Date   ESRSEDRATE 92 (H) 08/29/2017     REPTSTATUS 09/03/2017 FINAL 08/29/2017   CULT NO GROWTH 5 DAYS 08/29/2017   LABORGA No Salmonella,Shigella,Campylobacter,Yersinia,or 01/01/2015   LABORGA No E.coli 0157:H7 isolated. 01/01/2015    Orders:  No orders of the defined types were placed in this encounter.  Meds ordered this encounter  Medications  . doxycycline (VIBRA-TABS) 100 MG tablet    Sig: Take 1 tablet (100 mg total) 2 (two) times daily by mouth.    Dispense:  60 tablet    Refill:  0     Procedures: No procedures performed  Clinical Data: No additional findings.  ROS:  All other systems negative, except as noted in the HPI. Review of Systems  Objective: Vital Signs: There were no vitals taken for this visit.  Specialty Comments:  No specialty comments available.  PMFS History: Patient Active Problem List   Diagnosis Date Noted  . Allergic drug rash   . Sepsis ([place]) 08/29/2017  . CKD (chronic kidney disease), stage III ([place]) 08/29/2017  . Hypothyroid 08/29/2017  . Hypertension 08/29/2017  . Peripheral vascular disease ([place]) 08/29/2017  . History of necrotic Ulcer of right knee s/p debridement and VAC tx 08/29/2017  . Acute kidney injury ([place]) 08/29/2017  . Metabolic acidosis 29/92/4268  . Acute hyponatremia 08/29/2017  . Pressure injury of skin 07/25/2017  . Wound, open, knee, lower leg, or ankle with complication,  right, initial encounter   . Idiopathic chronic venous hypertension of both lower extremities with ulcer and inflammation ([place]) 07/15/2017  . Skin ulcer of right knee with necrosis of muscle ([place]) 07/15/2017  . Atherosclerosis of artery of right lower extremity ([place]) 07/11/2017  . Essential hypertension 07/11/2017  . Osteoporosis 07/11/2017  . Cataracts, bilateral 07/11/2017  . Hypothyroidism, acquired 12/27/2014  . Anemia, iron deficiency 12/27/2014  . Hiatal hernia 12/27/2014  . Varicose veins of lower extremities with other complications 56/43/3295   Past Medical History:   Diagnosis Date  . Allergy   . Anemia   . CKD (chronic kidney disease), stage II   . Fibromyalgia   . GERD (gastroesophageal reflux disease)   . Hiatal hernia   . Hypertension   . Hypothyroid   . Lymphedema    venous insufficency  . Osteoarthritis   . Peripheral vascular disease ([place])   . Ulcer of knee ([place])    right  . Urinary incontinence   . Varicose veins   . Venous insufficiency     Family History  Problem Relation Age of Onset  . Stroke Mother   . Varicose Veins Mother   . Cancer Father        prostate  . Stroke Sister   . Heart disease Sister   . Varicose Veins Sister   . Stroke Maternal Grandmother   . Varicose Veins Sister     Past Surgical History:  Procedure Laterality Date  . CATARACT EXTRACTION W/ INTRAOCULAR LENS  IMPLANT, BILATERAL    . COLONOSCOPY    . KNEE ARTHROSCOPY Left    menisectomy  . MULTIPLE TOOTH EXTRACTIONS    . TONSILLECTOMY     Social History   Occupational History    Comment: retired Marine scientist.   Tobacco Use  . Smoking status: Former Smoker    Packs/day: 0.50    Years: 10.00    Pack years: 5.00    Last attempt to quit: 11/19/1979    Years since quitting: 37.8  . Smokeless tobacco: Never Used  Substance and Sexual Activity  . Alcohol use: Yes    Alcohol/week: 8.4 oz    Types: 14 Glasses of wine per week    Comment: 2 glasses of wine with dinner  . Drug use: No  . Sexual activity: Not on file

## 2017-10-01 ENCOUNTER — Ambulatory Visit (INDEPENDENT_AMBULATORY_CARE_PROVIDER_SITE_OTHER): Payer: Medicare Other | Admitting: Orthopedic Surgery

## 2017-10-03 DIAGNOSIS — M60861 Other myositis, right lower leg: Secondary | ICD-10-CM | POA: Diagnosis not present

## 2017-10-03 DIAGNOSIS — I739 Peripheral vascular disease, unspecified: Secondary | ICD-10-CM | POA: Diagnosis not present

## 2017-10-03 DIAGNOSIS — L89610 Pressure ulcer of right heel, unstageable: Secondary | ICD-10-CM | POA: Diagnosis not present

## 2017-10-03 DIAGNOSIS — L97211 Non-pressure chronic ulcer of right calf limited to breakdown of skin: Secondary | ICD-10-CM | POA: Diagnosis not present

## 2017-10-03 DIAGNOSIS — I872 Venous insufficiency (chronic) (peripheral): Secondary | ICD-10-CM | POA: Diagnosis not present

## 2017-10-03 DIAGNOSIS — I129 Hypertensive chronic kidney disease with stage 1 through stage 4 chronic kidney disease, or unspecified chronic kidney disease: Secondary | ICD-10-CM | POA: Diagnosis not present

## 2017-10-06 ENCOUNTER — Telehealth (INDEPENDENT_AMBULATORY_CARE_PROVIDER_SITE_OTHER): Payer: Self-pay | Admitting: Orthopedic Surgery

## 2017-10-06 DIAGNOSIS — L89610 Pressure ulcer of right heel, unstageable: Secondary | ICD-10-CM | POA: Diagnosis not present

## 2017-10-06 DIAGNOSIS — I129 Hypertensive chronic kidney disease with stage 1 through stage 4 chronic kidney disease, or unspecified chronic kidney disease: Secondary | ICD-10-CM | POA: Diagnosis not present

## 2017-10-06 DIAGNOSIS — M60861 Other myositis, right lower leg: Secondary | ICD-10-CM | POA: Diagnosis not present

## 2017-10-06 DIAGNOSIS — L97211 Non-pressure chronic ulcer of right calf limited to breakdown of skin: Secondary | ICD-10-CM | POA: Diagnosis not present

## 2017-10-06 DIAGNOSIS — I872 Venous insufficiency (chronic) (peripheral): Secondary | ICD-10-CM | POA: Diagnosis not present

## 2017-10-06 DIAGNOSIS — I739 Peripheral vascular disease, unspecified: Secondary | ICD-10-CM | POA: Diagnosis not present

## 2017-10-06 NOTE — Telephone Encounter (Signed)
[name],[name] 1935-04-28    [facility]  [phone]  Please call to discuss right knee

## 2017-10-06 NOTE — Telephone Encounter (Signed)
I called and left voicemail for [facility] from [facility] in regards to patient's knee, not sure what she is needing.

## 2017-10-08 DIAGNOSIS — M60861 Other myositis, right lower leg: Secondary | ICD-10-CM | POA: Diagnosis not present

## 2017-10-08 DIAGNOSIS — L89610 Pressure ulcer of right heel, unstageable: Secondary | ICD-10-CM | POA: Diagnosis not present

## 2017-10-08 DIAGNOSIS — I739 Peripheral vascular disease, unspecified: Secondary | ICD-10-CM | POA: Diagnosis not present

## 2017-10-08 DIAGNOSIS — L97211 Non-pressure chronic ulcer of right calf limited to breakdown of skin: Secondary | ICD-10-CM | POA: Diagnosis not present

## 2017-10-08 DIAGNOSIS — I129 Hypertensive chronic kidney disease with stage 1 through stage 4 chronic kidney disease, or unspecified chronic kidney disease: Secondary | ICD-10-CM | POA: Diagnosis not present

## 2017-10-08 DIAGNOSIS — I872 Venous insufficiency (chronic) (peripheral): Secondary | ICD-10-CM | POA: Diagnosis not present

## 2017-10-10 DIAGNOSIS — I739 Peripheral vascular disease, unspecified: Secondary | ICD-10-CM | POA: Diagnosis not present

## 2017-10-10 DIAGNOSIS — I129 Hypertensive chronic kidney disease with stage 1 through stage 4 chronic kidney disease, or unspecified chronic kidney disease: Secondary | ICD-10-CM | POA: Diagnosis not present

## 2017-10-10 DIAGNOSIS — I872 Venous insufficiency (chronic) (peripheral): Secondary | ICD-10-CM | POA: Diagnosis not present

## 2017-10-10 DIAGNOSIS — L89610 Pressure ulcer of right heel, unstageable: Secondary | ICD-10-CM | POA: Diagnosis not present

## 2017-10-10 DIAGNOSIS — M60861 Other myositis, right lower leg: Secondary | ICD-10-CM | POA: Diagnosis not present

## 2017-10-10 DIAGNOSIS — L97211 Non-pressure chronic ulcer of right calf limited to breakdown of skin: Secondary | ICD-10-CM | POA: Diagnosis not present

## 2017-10-14 ENCOUNTER — Encounter (INDEPENDENT_AMBULATORY_CARE_PROVIDER_SITE_OTHER): Payer: Self-pay | Admitting: Orthopedic Surgery

## 2017-10-14 ENCOUNTER — Ambulatory Visit (INDEPENDENT_AMBULATORY_CARE_PROVIDER_SITE_OTHER): Payer: Medicare Other | Admitting: Orthopedic Surgery

## 2017-10-14 DIAGNOSIS — S91001A Unspecified open wound, right ankle, initial encounter: Secondary | ICD-10-CM

## 2017-10-14 DIAGNOSIS — L97813 Non-pressure chronic ulcer of other part of right lower leg with necrosis of muscle: Secondary | ICD-10-CM

## 2017-10-14 DIAGNOSIS — I87333 Chronic venous hypertension (idiopathic) with ulcer and inflammation of bilateral lower extremity: Secondary | ICD-10-CM

## 2017-10-14 DIAGNOSIS — S81001A Unspecified open wound, right knee, initial encounter: Secondary | ICD-10-CM

## 2017-10-14 DIAGNOSIS — S81801A Unspecified open wound, right lower leg, initial encounter: Secondary | ICD-10-CM

## 2017-10-14 DIAGNOSIS — L97919 Non-pressure chronic ulcer of unspecified part of right lower leg with unspecified severity: Secondary | ICD-10-CM

## 2017-10-14 DIAGNOSIS — L97929 Non-pressure chronic ulcer of unspecified part of left lower leg with unspecified severity: Secondary | ICD-10-CM

## 2017-10-14 NOTE — Progress Notes (Signed)
Office Visit Note   Patient: Shelby Reese           Date of Birth: 1935-05-16           MRN: [id number] Visit Date: 10/14/2017              Requested by: [name], MD [address] [place], [place] [address] PCP: [name], MD  Chief Complaint  Patient presents with  . Left Leg - Wound Check, Follow-up  . Right Leg - Open Wound, Follow-up      HPI: Patient is an 82 year old woman who presents in follow-up for a massive necrotic ulcer around the right knee.  Patient has been undergoing compression wraps these have been taken off by the patient and she presents 2 weeks after the last evaluation with a completely non-functioning wraps for both lower extremities.  Patient has weeping edema from the right ankle the ulcers on the left leg are dry.  The dressing is pulled down below the wound at the right knee.  Assessment & Plan: Visit Diagnoses:  1. Skin ulcer of right knee with necrosis of muscle ([place])   2. Idiopathic chronic venous hypertension of both lower extremities with ulcer and inflammation ([place])   3. Wound, open, knee, lower leg, or ankle with complication, right, initial encounter     Plan: Discussed with the patient that this is a significant problem we need better compression we need better compliance with elevation.  Discussed that she needs to keep her foot elevated above her heart discussed the risk of loss of limb.  We will apply Dynaflex wrap to the right leg above the knee and will follow-up Friday and Tuesday to change the wrap.  Follow-Up Instructions: Return in about 1 week (around 10/21/2017).   Ortho Exam  Patient is alert, oriented, no adenopathy, well-dressed, normal affect, normal respiratory effort. Examination the patient's legs have the wraps almost completely removed with only a thin layer remanent of the proper dressing.  Examination after the dressings are removed the left leg is clean and dry with no signs of infection however  the right leg where the wound is shows approximately 90% healthy tissue along the wound small amount of fibrinous exudative tissue patient has massive weeping edema from her ankle and she has a area of bruising on the right heel but no open ulcers.  Imaging: No results found. No images are attached to the encounter.  Labs: Lab Results  Component Value Date   ESRSEDRATE 92 (H) 08/29/2017   REPTSTATUS 09/03/2017 FINAL 08/29/2017   CULT NO GROWTH 5 DAYS 08/29/2017   LABORGA No Salmonella,Shigella,Campylobacter,Yersinia,or 01/01/2015   LABORGA No E.coli 0157:H7 isolated. 01/01/2015    @LABSALLVALUES (HGBA1)@  @BMI1 @  Orders:  No orders of the defined types were placed in this encounter.  No orders of the defined types were placed in this encounter.    Procedures: No procedures performed  Clinical Data: No additional findings.  ROS:  All other systems negative, except as noted in the HPI. Review of Systems  Objective: Vital Signs: There were no vitals taken for this visit.  Specialty Comments:  No specialty comments available.  PMFS History: Patient Active Problem List   Diagnosis Date Noted  . Allergic drug rash   . Sepsis ([place]) 08/29/2017  . CKD (chronic kidney disease), stage III ([place]) 08/29/2017  . Hypothyroid 08/29/2017  . Hypertension 08/29/2017  . Peripheral vascular disease ([place]) 08/29/2017  . History of necrotic Ulcer of right knee  s/p debridement and VAC tx 08/29/2017  . Acute kidney injury ([place]) 08/29/2017  . Metabolic acidosis 81/19/1478  . Acute hyponatremia 08/29/2017  . Pressure injury of skin 07/25/2017  . Wound, open, knee, lower leg, or ankle with complication, right, initial encounter   . Idiopathic chronic venous hypertension of both lower extremities with ulcer and inflammation ([place]) 07/15/2017  . Skin ulcer of right knee with necrosis of muscle ([place]) 07/15/2017  . Atherosclerosis of artery of right lower extremity ([place]) 07/11/2017  .  Essential hypertension 07/11/2017  . Osteoporosis 07/11/2017  . Cataracts, bilateral 07/11/2017  . Hypothyroidism, acquired 12/27/2014  . Anemia, iron deficiency 12/27/2014  . Hiatal hernia 12/27/2014  . Varicose veins of lower extremities with other complications 29/56/2130   Past Medical History:  Diagnosis Date  . Allergy   . Anemia   . CKD (chronic kidney disease), stage II   . Fibromyalgia   . GERD (gastroesophageal reflux disease)   . Hiatal hernia   . Hypertension   . Hypothyroid   . Lymphedema    venous insufficency  . Osteoarthritis   . Peripheral vascular disease ([place])   . Ulcer of knee ([place])    right  . Urinary incontinence   . Varicose veins   . Venous insufficiency     Family History  Problem Relation Age of Onset  . Stroke Mother   . Varicose Veins Mother   . Cancer Father        prostate  . Stroke Sister   . Heart disease Sister   . Varicose Veins Sister   . Stroke Maternal Grandmother   . Varicose Veins Sister     Past Surgical History:  Procedure Laterality Date  . APPLICATION OF WOUND VAC Right 07/18/2017   Procedure: APPLICATION OF WOUND VAC;  Surgeon: [name], MD;  Location: [place];  Service: Orthopedics;  Laterality: Right;  . CATARACT EXTRACTION W/ INTRAOCULAR LENS  IMPLANT, BILATERAL    . COLONOSCOPY    . I&D EXTREMITY Right 07/18/2017   Procedure: IRRIGATION AND DEBRIDEMENT RIGHT KNEE;  Surgeon: [name], MD;  Location: [facility];  Service: Orthopedics;  Laterality: Right;  . I&D EXTREMITY Right 07/23/2017   Procedure: REPEAT IRRIGATION AND DEBRIDEMENT RIGHT KNEE;  Surgeon: [name], MD;  Location: [facility];  Service: Orthopedics;  Laterality: Right;  . KNEE ARTHROSCOPY Left    menisectomy  . MULTIPLE TOOTH EXTRACTIONS    . SKIN SPLIT GRAFT Right 07/25/2017   Procedure: Repeat Irrigation and Debridement Right Knee, Split Thickness Skin Graft;  Surgeon: [name], MD;  Location: [facility];  Service: Orthopedics;  Laterality: Right;    . TONSILLECTOMY     Social History   Occupational History    Comment: retired Marine scientist.   Tobacco Use  . Smoking status: Former Smoker    Packs/day: 0.50    Years: 10.00    Pack years: 5.00    Last attempt to quit: 11/19/1979    Years since quitting: 37.9  . Smokeless tobacco: Never Used  Substance and Sexual Activity  . Alcohol use: Yes    Alcohol/week: 8.4 oz    Types: 14 Glasses of wine per week    Comment: 2 glasses of wine with dinner  . Drug use: No  . Sexual activity: Not on file

## 2017-10-17 ENCOUNTER — Encounter (INDEPENDENT_AMBULATORY_CARE_PROVIDER_SITE_OTHER): Payer: Self-pay | Admitting: Family

## 2017-10-17 ENCOUNTER — Ambulatory Visit (INDEPENDENT_AMBULATORY_CARE_PROVIDER_SITE_OTHER): Payer: Medicare Other | Admitting: Family

## 2017-10-17 DIAGNOSIS — L97919 Non-pressure chronic ulcer of unspecified part of right lower leg with unspecified severity: Secondary | ICD-10-CM

## 2017-10-17 DIAGNOSIS — L97819 Non-pressure chronic ulcer of other part of right lower leg with unspecified severity: Secondary | ICD-10-CM | POA: Diagnosis not present

## 2017-10-17 DIAGNOSIS — S81801A Unspecified open wound, right lower leg, initial encounter: Secondary | ICD-10-CM

## 2017-10-17 DIAGNOSIS — L97929 Non-pressure chronic ulcer of unspecified part of left lower leg with unspecified severity: Secondary | ICD-10-CM | POA: Diagnosis not present

## 2017-10-17 DIAGNOSIS — S91001A Unspecified open wound, right ankle, initial encounter: Secondary | ICD-10-CM | POA: Diagnosis not present

## 2017-10-17 DIAGNOSIS — S81001A Unspecified open wound, right knee, initial encounter: Secondary | ICD-10-CM | POA: Diagnosis not present

## 2017-10-17 DIAGNOSIS — I87333 Chronic venous hypertension (idiopathic) with ulcer and inflammation of bilateral lower extremity: Secondary | ICD-10-CM

## 2017-10-17 DIAGNOSIS — L97813 Non-pressure chronic ulcer of other part of right lower leg with necrosis of muscle: Secondary | ICD-10-CM | POA: Diagnosis not present

## 2017-10-17 MED ORDER — HYDROCODONE-ACETAMINOPHEN 5-325 MG PO TABS: 1.0000 | ORAL_TABLET | Freq: Four times a day (QID) | ORAL | 0 refills | Status: DC | PRN

## 2017-10-17 NOTE — Progress Notes (Signed)
Office Visit Note   Patient: Shelby Reese           Date of Birth: Oct 17, 1935           MRN: [id number] Visit Date: 10/17/2017              Requested by: Shelby Pretty, MD [address] [place], [place] [address] PCP: Shelby Pretty, MD  No chief complaint on file.     HPI: Patient is an 82 year old woman who presents in follow-up for a massive necrotic ulcer around the right knee.  Patient has been undergoing compression wraps these have been taken off by the patient. Today has cut and rolled down the top of her RLE wrap and cut open the foot. Has nothing on the left lower extremity. Patient has weeping edema from the left leg.  Shelby Reese growing frustrated with patients lack of compliance to their treatments.  Assessment & Plan: Visit Diagnoses:  No diagnosis found.  Plan: Discussed with the patient that this is a significant problem we need better compression we need better compliance with elevation.  Discussed that she needs to keep her foot elevated above her heart discussed the risk of loss of limb.  We also need better compliance with compressive wraps.  will follow-up fTuesday to change the wrap. Unna boot applied bilaterally, to right applied thigh high.  Follow-Up Instructions: No Follow-up on file.   Ortho Exam  Patient is alert, oriented, no adenopathy, well-dressed, normal affect, normal respiratory effort. Examination the patient's legs. Examination the left LE is massively swollen with weeping edema. Flaking skin. No odor. No cellulitis. no signs of infection. however the right leg has improved swelling distally from wrap. weeping edema at level of ulceration. the wound is shows approximately 80% healthy tissue along the wound small amount of fibrinous exudative tissue. No purulence surrounding cellulitis or sign of infection.   Imaging: No results found. No images are attached to the encounter.      Labs: Lab Results  Component Value Date   ESRSEDRATE 92 (H) 08/29/2017   REPTSTATUS 09/03/2017 FINAL 08/29/2017   CULT NO GROWTH 5 DAYS 08/29/2017   LABORGA No Salmonella,Shigella,Campylobacter,Yersinia,or 01/01/2015   LABORGA No E.coli 0157:H7 isolated. 01/01/2015      Orders:  No orders of the defined types were placed in this encounter.  Meds ordered this encounter  Medications  . DISCONTD: HYDROcodone-acetaminophen (NORCO/VICODIN) 5-325 MG tablet    Sig: Take 1 tablet by mouth every 6 (six) hours as needed for moderate pain.    Dispense:  30 tablet    Refill:  0  . HYDROcodone-acetaminophen (NORCO/VICODIN) 5-325 MG tablet    Sig: Take 1 tablet by mouth every 6 (six) hours as needed for moderate pain.    Dispense:  30 tablet    Refill:  0     Procedures: No procedures performed  Clinical Data: No additional findings.  ROS:  All other systems negative, except as noted in the HPI. Review of Systems  Constitutional: Negative for chills and fever.  Cardiovascular: Positive for leg swelling.  Musculoskeletal: Positive for arthralgias.  Skin: Positive for rash and wound.    Objective: Vital Signs: There were no vitals taken for this visit.  Specialty Comments:  No specialty comments available.  PMFS History: Patient Active Problem List   Diagnosis Date Noted  . Allergic drug rash   . Sepsis ([place]) 08/29/2017  . CKD (chronic kidney disease), stage III ([place]) 08/29/2017  . Hypothyroid 08/29/2017  .  Hypertension 08/29/2017  . Peripheral vascular disease ([place]) 08/29/2017  . History of necrotic Ulcer of right knee s/p debridement and VAC tx 08/29/2017  . Acute kidney injury ([place]) 08/29/2017  . Metabolic acidosis 19/37/9024  . Acute hyponatremia 08/29/2017  . Pressure injury of skin 07/25/2017  . Wound, open, knee, lower leg, or ankle with complication, right, initial encounter   . Idiopathic chronic venous hypertension of both lower extremities with ulcer and inflammation ([place]) 07/15/2017  . Skin ulcer of  right knee with necrosis of muscle ([place]) 07/15/2017  . Atherosclerosis of artery of right lower extremity ([place]) 07/11/2017  . Essential hypertension 07/11/2017  . Osteoporosis 07/11/2017  . Cataracts, bilateral 07/11/2017  . Hypothyroidism, acquired 12/27/2014  . Anemia, iron deficiency 12/27/2014  . Hiatal hernia 12/27/2014  . Varicose veins of lower extremities with other complications 09/73/5329   Past Medical History:  Diagnosis Date  . Allergy   . Anemia   . CKD (chronic kidney disease), stage II   . Fibromyalgia   . GERD (gastroesophageal reflux disease)   . Hiatal hernia   . Hypertension   . Hypothyroid   . Lymphedema    venous insufficency  . Osteoarthritis   . Peripheral vascular disease ([place])   . Ulcer of knee ([place])    right  . Urinary incontinence   . Varicose veins   . Venous insufficiency     Family History  Problem Relation Age of Onset  . Stroke Mother   . Varicose Veins Mother   . Cancer Father        prostate  . Stroke Sister   . Heart disease Sister   . Varicose Veins Sister   . Stroke Maternal Grandmother   . Varicose Veins Sister     Past Surgical History:  Procedure Laterality Date  . APPLICATION OF WOUND VAC Right 07/18/2017   Procedure: APPLICATION OF WOUND VAC;  Surgeon: [name], MD;  Location: [facility];  Service: Orthopedics;  Laterality: Right;  . CATARACT EXTRACTION W/ INTRAOCULAR LENS  IMPLANT, BILATERAL    . COLONOSCOPY    . I&D EXTREMITY Right 07/18/2017   Procedure: IRRIGATION AND DEBRIDEMENT RIGHT KNEE;  Surgeon: [name], MD;  Location: [facility];  Service: Orthopedics;  Laterality: Right;  . I&D EXTREMITY Right 07/23/2017   Procedure: REPEAT IRRIGATION AND DEBRIDEMENT RIGHT KNEE;  Surgeon: [name], MD;  Location: [facility];  Service: Orthopedics;  Laterality: Right;  . KNEE ARTHROSCOPY Left    menisectomy  . MULTIPLE TOOTH EXTRACTIONS    . SKIN SPLIT GRAFT Right 07/25/2017   Procedure: Repeat Irrigation and Debridement Right  Knee, Split Thickness Skin Graft;  Surgeon: [name], MD;  Location: [facility];  Service: Orthopedics;  Laterality: Right;  . TONSILLECTOMY     Social History   Occupational History    Comment: retired Marine scientist.   Tobacco Use  . Smoking status: Former Smoker    Packs/day: 0.50    Years: 10.00    Pack years: 5.00    Last attempt to quit: 11/19/1979    Years since quitting: 37.9  . Smokeless tobacco: Never Used  Substance and Sexual Activity  . Alcohol use: Yes    Alcohol/week: 8.4 oz    Types: 14 Glasses of wine per week    Comment: 2 glasses of wine with dinner  . Drug use: No  . Sexual activity: Not on file

## 2017-10-20 ENCOUNTER — Telehealth (INDEPENDENT_AMBULATORY_CARE_PROVIDER_SITE_OTHER): Payer: Self-pay | Admitting: *Deleted

## 2017-10-20 DIAGNOSIS — L97211 Non-pressure chronic ulcer of right calf limited to breakdown of skin: Secondary | ICD-10-CM | POA: Diagnosis not present

## 2017-10-20 DIAGNOSIS — M60861 Other myositis, right lower leg: Secondary | ICD-10-CM | POA: Diagnosis not present

## 2017-10-20 DIAGNOSIS — I872 Venous insufficiency (chronic) (peripheral): Secondary | ICD-10-CM | POA: Diagnosis not present

## 2017-10-20 DIAGNOSIS — I739 Peripheral vascular disease, unspecified: Secondary | ICD-10-CM | POA: Diagnosis not present

## 2017-10-20 DIAGNOSIS — I129 Hypertensive chronic kidney disease with stage 1 through stage 4 chronic kidney disease, or unspecified chronic kidney disease: Secondary | ICD-10-CM | POA: Diagnosis not present

## 2017-10-20 DIAGNOSIS — L89610 Pressure ulcer of right heel, unstageable: Secondary | ICD-10-CM | POA: Diagnosis not present

## 2017-10-20 NOTE — Telephone Encounter (Signed)
[facility] nurse Carrolyn Meiers called wanting to know if there were any changes in pt wound care, when she arrived her orders did not match with leg. Would like a call back in regarding orders.   CB [phone]

## 2017-10-20 NOTE — Telephone Encounter (Signed)
I called and spoke with Shelby Reese advised that patient is being wrapped in the office. Advised she has appointment tomorrow, will update tomorrow and fax order after office visit tomorrow to [facility].

## 2017-10-21 ENCOUNTER — Ambulatory Visit (INDEPENDENT_AMBULATORY_CARE_PROVIDER_SITE_OTHER): Payer: Medicare Other | Admitting: Orthopedic Surgery

## 2017-10-21 ENCOUNTER — Ambulatory Visit (INDEPENDENT_AMBULATORY_CARE_PROVIDER_SITE_OTHER): Payer: Medicare Other | Admitting: Radiology

## 2017-10-21 ENCOUNTER — Encounter (INDEPENDENT_AMBULATORY_CARE_PROVIDER_SITE_OTHER): Payer: Self-pay | Admitting: Radiology

## 2017-10-21 DIAGNOSIS — I87333 Chronic venous hypertension (idiopathic) with ulcer and inflammation of bilateral lower extremity: Secondary | ICD-10-CM

## 2017-10-21 DIAGNOSIS — L97919 Non-pressure chronic ulcer of unspecified part of right lower leg with unspecified severity: Secondary | ICD-10-CM

## 2017-10-21 DIAGNOSIS — I70201 Unspecified atherosclerosis of native arteries of extremities, right leg: Secondary | ICD-10-CM

## 2017-10-21 DIAGNOSIS — L97929 Non-pressure chronic ulcer of unspecified part of left lower leg with unspecified severity: Secondary | ICD-10-CM

## 2017-10-21 NOTE — Telephone Encounter (Signed)
[facility] will pend orders till appt on Friday. Pt has not been elevating leg and manipulating dressing and will need to see her back in the office on Friday to decide if she needs to come in the office biweekly for dressing changes here or if to continue with [facility].

## 2017-10-21 NOTE — Progress Notes (Signed)
Patient came in to office today and bilateral leg dressings were removed.  I reapplied unna and dynaflex to both.  Left left was still weeping distal third of leg.  Lots of drainage present on right knee wound.  Patient had cut the feet area of the dressings, and I cautioned her not to do this.  She is wondering why her feet are bright red, and swelling.  I told her to elevate above heart level and keep up as much as possible.  She will f/u Friday with Erin for recheck.  We need to call and speak with [facility] after that appt to give updated orders.

## 2017-10-22 NOTE — Telephone Encounter (Signed)
Patient was evaluated in the office yesterday afternoon for bilateral lower extremities, and previously on Friday 10/17/17. There was moderate to heavy fibrinous exudative tissue to right knee wound.  She was placed in unna boot and dynaflex wrap to thigh on right lower extremity with multiple abdominal pads over knee wound to absorb drainage. And left leg was examined both office visits with weeping edema left lower extremity. Patient was also placed in dynaflex (multilayer) compression wrap from metatarsal heads to below left tibial tubercle. She will come back in the office for repeat evaluation on Friday. No home health needed for wound care at this time. Will update orders after office visit on Friday.    This was placed in communications tab and faxed to [facility] [phone] and told to them verbally on the phone as well.

## 2017-10-24 ENCOUNTER — Encounter (INDEPENDENT_AMBULATORY_CARE_PROVIDER_SITE_OTHER): Payer: Self-pay | Admitting: Family

## 2017-10-24 ENCOUNTER — Ambulatory Visit (INDEPENDENT_AMBULATORY_CARE_PROVIDER_SITE_OTHER): Payer: Medicare Other | Admitting: Family

## 2017-10-24 DIAGNOSIS — L97929 Non-pressure chronic ulcer of unspecified part of left lower leg with unspecified severity: Secondary | ICD-10-CM

## 2017-10-24 DIAGNOSIS — L97813 Non-pressure chronic ulcer of other part of right lower leg with necrosis of muscle: Secondary | ICD-10-CM

## 2017-10-24 DIAGNOSIS — I70201 Unspecified atherosclerosis of native arteries of extremities, right leg: Secondary | ICD-10-CM | POA: Diagnosis not present

## 2017-10-24 DIAGNOSIS — L97819 Non-pressure chronic ulcer of other part of right lower leg with unspecified severity: Secondary | ICD-10-CM | POA: Diagnosis not present

## 2017-10-24 DIAGNOSIS — I87333 Chronic venous hypertension (idiopathic) with ulcer and inflammation of bilateral lower extremity: Secondary | ICD-10-CM

## 2017-10-24 DIAGNOSIS — L97919 Non-pressure chronic ulcer of unspecified part of right lower leg with unspecified severity: Secondary | ICD-10-CM

## 2017-10-24 NOTE — Progress Notes (Signed)
Office Visit Note   Patient: Shelby Reese           Date of Birth: 07/19/1935           MRN: [id number] Visit Date: 10/24/2017              Requested by: [name], MD [address] [place],  [id number] PCP: [name], MD  Chief Complaint  Patient presents with  . Right Leg - Wound Check, Edema  . Left Leg - Follow-up, Edema      HPI: Patient is an 82 year old woman who presents in follow-up for a massive necrotic ulcer around the right knee.  Patient has been undergoing compression wraps, last applied 3 days ago, Tuesday of this week. these have been taken off or loosened by the patient multiple times.  Complains that wrap has rolled down and put pressure on medial thigh and medial aspect of ulcer.   Assessment & Plan: Visit Diagnoses:  1. Idiopathic chronic venous hypertension of both lower extremities with ulcer and inflammation ([place])   2. Ulcer of right knee, unspecified ulcer stage ([place])     Plan: Discussed with the patient that we need better compliance with elevation.  Discussed that she needs to keep her foot elevated above her heart discussed the risk of loss of limb.  We also need better compliance with compressive wraps.  Have reapplied unna boots and dynaflex. Will have [place] assist [place] in her care, doing 3 times weekly changes. She will follow up in office in 2 weeks.   Follow-Up Instructions: No Follow-up on file.   Ortho Exam  Patient is alert, oriented, no adenopathy, well-dressed, normal affect, normal respiratory effort. Examination the patient's legs. Examination the left LE has improved with some wrinkling of skin from compression wrap. No ulcers. Dry faking skin. No odor. No cellulitis. no signs of infection.  However the right leg has improved swelling distally from wrap. No weeping edema today.  the wound is stable with approximately 80% healthy tissue along the wound small amount of fibrinous exudative tissue. Continues to have  copious drainage from ulcer. No surrounding maceration or worsening of ulcer. No purulence, no surrounding cellulitis or sign of infection.   Imaging: No results found. No images are attached to the encounter.     Labs: Lab Results  Component Value Date   ESRSEDRATE 92 (H) 08/29/2017   REPTSTATUS 09/03/2017 FINAL 08/29/2017   CULT NO GROWTH 5 DAYS 08/29/2017   LABORGA No Salmonella,Shigella,Campylobacter,Yersinia,or 01/01/2015   LABORGA No E.coli 0157:H7 isolated. 01/01/2015      Orders:  No orders of the defined types were placed in this encounter.  No orders of the defined types were placed in this encounter.    Procedures: No procedures performed  Clinical Data: No additional findings.  ROS:  All other systems negative, except as noted in the HPI. Review of Systems  Constitutional: Negative for chills and fever.  Cardiovascular: Positive for leg swelling.  Musculoskeletal: Positive for arthralgias.  Skin: Positive for rash and wound.    Objective: Vital Signs: There were no vitals taken for this visit.  Specialty Comments:  No specialty comments available.  PMFS History: Patient Active Problem List   Diagnosis Date Noted  . Allergic drug rash   . Sepsis ([place]) 08/29/2017  . CKD (chronic kidney disease), stage III ([place]) 08/29/2017  . Hypothyroid 08/29/2017  . Hypertension 08/29/2017  . Peripheral vascular disease ([place]) 08/29/2017  . History of necrotic Ulcer  of right knee s/p debridement and VAC tx 08/29/2017  . Acute kidney injury ([place]) 08/29/2017  . Metabolic acidosis 84/16/6063  . Acute hyponatremia 08/29/2017  . Pressure injury of skin 07/25/2017  . Wound, open, knee, lower leg, or ankle with complication, right, initial encounter   . Idiopathic chronic venous hypertension of both lower extremities with ulcer and inflammation ([place]) 07/15/2017  . Skin ulcer of right knee with necrosis of muscle ([place]) 07/15/2017  . Atherosclerosis of artery of  right lower extremity ([place]) 07/11/2017  . Essential hypertension 07/11/2017  . Osteoporosis 07/11/2017  . Cataracts, bilateral 07/11/2017  . Hypothyroidism, acquired 12/27/2014  . Anemia, iron deficiency 12/27/2014  . Hiatal hernia 12/27/2014  . Varicose veins of lower extremities with other complications 01/60/1093   Past Medical History:  Diagnosis Date  . Allergy   . Anemia   . CKD (chronic kidney disease), stage II   . Fibromyalgia   . GERD (gastroesophageal reflux disease)   . Hiatal hernia   . Hypertension   . Hypothyroid   . Lymphedema    venous insufficency  . Osteoarthritis   . Peripheral vascular disease ([place])   . Ulcer of knee ([place])    right  . Urinary incontinence   . Varicose veins   . Venous insufficiency     Family History  Problem Relation Age of Onset  . Stroke Mother   . Varicose Veins Mother   . Cancer Father        prostate  . Stroke Sister   . Heart disease Sister   . Varicose Veins Sister   . Stroke Maternal Grandmother   . Varicose Veins Sister     Past Surgical History:  Procedure Laterality Date  . APPLICATION OF WOUND VAC Right 07/18/2017   Procedure: APPLICATION OF WOUND VAC;  Surgeon: [name], MD;  Location: [facility];  Service: Orthopedics;  Laterality: Right;  . CATARACT EXTRACTION W/ INTRAOCULAR LENS  IMPLANT, BILATERAL    . COLONOSCOPY    . I&D EXTREMITY Right 07/18/2017   Procedure: IRRIGATION AND DEBRIDEMENT RIGHT KNEE;  Surgeon: [name], MD;  Location: [facility];  Service: Orthopedics;  Laterality: Right;  . I&D EXTREMITY Right 07/23/2017   Procedure: REPEAT IRRIGATION AND DEBRIDEMENT RIGHT KNEE;  Surgeon: [name], MD;  Location: [facility];  Service: Orthopedics;  Laterality: Right;  . KNEE ARTHROSCOPY Left    menisectomy  . MULTIPLE TOOTH EXTRACTIONS    . SKIN SPLIT GRAFT Right 07/25/2017   Procedure: Repeat Irrigation and Debridement Right Knee, Split Thickness Skin Graft;  Surgeon: [name], MD;  Location: [facility];   Service: Orthopedics;  Laterality: Right;  . TONSILLECTOMY     Social History   Occupational History    Comment: retired Marine scientist.   Tobacco Use  . Smoking status: Former Smoker    Packs/day: 0.50    Years: 10.00    Pack years: 5.00    Last attempt to quit: 11/19/1979    Years since quitting: 37.9  . Smokeless tobacco: Never Used  Substance and Sexual Activity  . Alcohol use: Yes    Alcohol/week: 8.4 oz    Types: 14 Glasses of wine per week    Comment: 2 glasses of wine with dinner  . Drug use: No  . Sexual activity: Not on file

## 2017-10-28 ENCOUNTER — Other Ambulatory Visit (INDEPENDENT_AMBULATORY_CARE_PROVIDER_SITE_OTHER): Payer: Self-pay | Admitting: Orthopedic Surgery

## 2017-10-28 DIAGNOSIS — I872 Venous insufficiency (chronic) (peripheral): Secondary | ICD-10-CM | POA: Diagnosis not present

## 2017-10-28 DIAGNOSIS — M60861 Other myositis, right lower leg: Secondary | ICD-10-CM | POA: Diagnosis not present

## 2017-10-28 DIAGNOSIS — I129 Hypertensive chronic kidney disease with stage 1 through stage 4 chronic kidney disease, or unspecified chronic kidney disease: Secondary | ICD-10-CM | POA: Diagnosis not present

## 2017-10-28 DIAGNOSIS — L97211 Non-pressure chronic ulcer of right calf limited to breakdown of skin: Secondary | ICD-10-CM | POA: Diagnosis not present

## 2017-10-28 DIAGNOSIS — I739 Peripheral vascular disease, unspecified: Secondary | ICD-10-CM | POA: Diagnosis not present

## 2017-10-28 DIAGNOSIS — L89610 Pressure ulcer of right heel, unstageable: Secondary | ICD-10-CM | POA: Diagnosis not present

## 2017-10-28 NOTE — Addendum Note (Signed)
Addended by: Maxcine Ham on: 10/28/2017 02:49 PM   Modules accepted: Orders

## 2017-10-28 NOTE — Telephone Encounter (Signed)
Patient called asking for a refill on the generic Vicodin. CB #  [phone]

## 2017-10-29 ENCOUNTER — Telehealth (INDEPENDENT_AMBULATORY_CARE_PROVIDER_SITE_OTHER): Payer: Self-pay | Admitting: Orthopedic Surgery

## 2017-10-29 MED ORDER — HYDROCODONE-ACETAMINOPHEN 5-325 MG PO TABS: 1.0000 | ORAL_TABLET | Freq: Four times a day (QID) | ORAL | 0 refills | Status: DC | PRN

## 2017-10-29 NOTE — Telephone Encounter (Signed)
I called and advised pt that rx is available at desk for pick up.

## 2017-10-29 NOTE — Telephone Encounter (Signed)
Patient called left voicemail message needing Rx refilled (Vicodin) The number to contact patient is [phone]

## 2017-10-30 DIAGNOSIS — I129 Hypertensive chronic kidney disease with stage 1 through stage 4 chronic kidney disease, or unspecified chronic kidney disease: Secondary | ICD-10-CM | POA: Diagnosis not present

## 2017-10-30 DIAGNOSIS — I739 Peripheral vascular disease, unspecified: Secondary | ICD-10-CM | POA: Diagnosis not present

## 2017-10-30 DIAGNOSIS — I872 Venous insufficiency (chronic) (peripheral): Secondary | ICD-10-CM | POA: Diagnosis not present

## 2017-10-30 DIAGNOSIS — L97211 Non-pressure chronic ulcer of right calf limited to breakdown of skin: Secondary | ICD-10-CM | POA: Diagnosis not present

## 2017-10-30 DIAGNOSIS — L89610 Pressure ulcer of right heel, unstageable: Secondary | ICD-10-CM | POA: Diagnosis not present

## 2017-10-30 DIAGNOSIS — M60861 Other myositis, right lower leg: Secondary | ICD-10-CM | POA: Diagnosis not present

## 2017-10-31 DIAGNOSIS — L97211 Non-pressure chronic ulcer of right calf limited to breakdown of skin: Secondary | ICD-10-CM | POA: Diagnosis not present

## 2017-10-31 DIAGNOSIS — I872 Venous insufficiency (chronic) (peripheral): Secondary | ICD-10-CM | POA: Diagnosis not present

## 2017-10-31 DIAGNOSIS — I739 Peripheral vascular disease, unspecified: Secondary | ICD-10-CM | POA: Diagnosis not present

## 2017-10-31 DIAGNOSIS — L89610 Pressure ulcer of right heel, unstageable: Secondary | ICD-10-CM | POA: Diagnosis not present

## 2017-10-31 DIAGNOSIS — I129 Hypertensive chronic kidney disease with stage 1 through stage 4 chronic kidney disease, or unspecified chronic kidney disease: Secondary | ICD-10-CM | POA: Diagnosis not present

## 2017-10-31 DIAGNOSIS — M60861 Other myositis, right lower leg: Secondary | ICD-10-CM | POA: Diagnosis not present

## 2017-11-03 DIAGNOSIS — I129 Hypertensive chronic kidney disease with stage 1 through stage 4 chronic kidney disease, or unspecified chronic kidney disease: Secondary | ICD-10-CM | POA: Diagnosis not present

## 2017-11-03 DIAGNOSIS — L89610 Pressure ulcer of right heel, unstageable: Secondary | ICD-10-CM | POA: Diagnosis not present

## 2017-11-03 DIAGNOSIS — I872 Venous insufficiency (chronic) (peripheral): Secondary | ICD-10-CM | POA: Diagnosis not present

## 2017-11-03 DIAGNOSIS — M60861 Other myositis, right lower leg: Secondary | ICD-10-CM | POA: Diagnosis not present

## 2017-11-03 DIAGNOSIS — L97211 Non-pressure chronic ulcer of right calf limited to breakdown of skin: Secondary | ICD-10-CM | POA: Diagnosis not present

## 2017-11-03 DIAGNOSIS — I739 Peripheral vascular disease, unspecified: Secondary | ICD-10-CM | POA: Diagnosis not present

## 2017-11-05 ENCOUNTER — Ambulatory Visit (INDEPENDENT_AMBULATORY_CARE_PROVIDER_SITE_OTHER): Payer: Medicare Other | Admitting: Orthopedic Surgery

## 2017-11-05 ENCOUNTER — Encounter (INDEPENDENT_AMBULATORY_CARE_PROVIDER_SITE_OTHER): Payer: Self-pay | Admitting: Orthopedic Surgery

## 2017-11-05 VITALS — Ht 64.0 in | Wt 227.0 lb

## 2017-11-05 DIAGNOSIS — L97819 Non-pressure chronic ulcer of other part of right lower leg with unspecified severity: Secondary | ICD-10-CM

## 2017-11-05 MED ORDER — OXYCODONE-ACETAMINOPHEN 5-325 MG PO TABS: 1.0000 | ORAL_TABLET | ORAL | 0 refills | Status: DC | PRN

## 2017-11-05 NOTE — Addendum Note (Signed)
Addended by: Meridee Score on: 11/05/2017 04:50 PM   Modules accepted: Orders

## 2017-11-05 NOTE — Progress Notes (Signed)
Office Visit Note   Patient: Shelby Reese           Date of Birth: 1935/05/03           MRN: [id number] Visit Date: 11/05/2017              Requested by: [name], MD [address] [place], [place] [address] PCP: [name], MD  Chief Complaint  Patient presents with  . Right Leg - Follow-up    07/18/17 irrigation and debridement right knee      HPI: Patient is an 82 year old woman who presents status post debridement and skin grafting for a massive wound right knee.  Patient has continued to manipulate the dressing she has kept the dressing off over the wound causing increased swelling around the knee.  Patient states that she has been up on her feet she is preparing meals for both herself and her husband.  She has progressive increased swelling in the right knee with progressive drainage and fibrinous exudative tissue.  Assessment & Plan: Visit Diagnoses:  1. Ulcer of right knee, unspecified ulcer stage ([place])     Plan: Patient behavior is causing increasing wound problems and she is at risk of loss of limb.  We will need to plan for return to the operating room patient would like to proceed after the holidays.  Would plan for irrigation debridement placement of instillation wound VAC on a Wednesday follow-up with repeat irrigation and debridement and placement of an installation wound VAC on a Friday with follow-up and placement of split-thickness skin graft the following Wednesday with discharge with a wound VAC.  Follow-Up Instructions: Return in about 1 week (around 11/12/2017).   Ortho Exam  Patient is alert, oriented, no adenopathy, well-dressed, normal affect, normal respiratory effort. Examination patient has clear drainage from the knee wound.  There is fibrinous exudative tissue covering the entire wound and there is increasing necrotic tissue over the posterior medial aspect of the wound.  Patient still has venous swelling in the right lower  extremity with massive swelling around the knee and thigh due to her unwrapping the top of the dressing.  Imaging: No results found. No images are attached to the encounter.  Labs: Lab Results  Component Value Date   ESRSEDRATE 92 (H) 08/29/2017   REPTSTATUS 09/03/2017 FINAL 08/29/2017   CULT NO GROWTH 5 DAYS 08/29/2017   LABORGA No Salmonella,Shigella,Campylobacter,Yersinia,or 01/01/2015   LABORGA No E.coli 0157:H7 isolated. 01/01/2015    @LABSALLVALUES (HGBA1)@  Body mass index is 38.96 kg/m.  Orders:  No orders of the defined types were placed in this encounter.  No orders of the defined types were placed in this encounter.    Procedures: No procedures performed  Clinical Data: No additional findings.  ROS:  All other systems negative, except as noted in the HPI. Review of Systems  Objective: Vital Signs: Ht 5\' 4"  (1.626 m)   Wt 227 lb (103 kg)   BMI 38.96 kg/m   Specialty Comments:  No specialty comments available.  PMFS History: Patient Active Problem List   Diagnosis Date Noted  . Allergic drug rash   . Sepsis ([place]) 08/29/2017  . CKD (chronic kidney disease), stage III ([place]) 08/29/2017  . Hypothyroid 08/29/2017  . Hypertension 08/29/2017  . Peripheral vascular disease ([place]) 08/29/2017  . History of necrotic Ulcer of right knee s/p debridement and VAC tx 08/29/2017  . Acute kidney injury ([place]) 08/29/2017  . Metabolic acidosis 74/16/3845  . Acute hyponatremia 08/29/2017  .  Pressure injury of skin 07/25/2017  . Wound, open, knee, lower leg, or ankle with complication, right, initial encounter   . Idiopathic chronic venous hypertension of both lower extremities with ulcer and inflammation ([place]) 07/15/2017  . Skin ulcer of right knee with necrosis of muscle ([place]) 07/15/2017  . Atherosclerosis of artery of right lower extremity ([place]) 07/11/2017  . Essential hypertension 07/11/2017  . Osteoporosis 07/11/2017  . Cataracts, bilateral 07/11/2017  .  Hypothyroidism, acquired 12/27/2014  . Anemia, iron deficiency 12/27/2014  . Hiatal hernia 12/27/2014  . Varicose veins of lower extremities with other complications 03/70/4888   Past Medical History:  Diagnosis Date  . Allergy   . Anemia   . CKD (chronic kidney disease), stage II   . Fibromyalgia   . GERD (gastroesophageal reflux disease)   . Hiatal hernia   . Hypertension   . Hypothyroid   . Lymphedema    venous insufficency  . Osteoarthritis   . Peripheral vascular disease ([place])   . Ulcer of knee ([place])    right  . Urinary incontinence   . Varicose veins   . Venous insufficiency     Family History  Problem Relation Age of Onset  . Stroke Mother   . Varicose Veins Mother   . Cancer Father        prostate  . Stroke Sister   . Heart disease Sister   . Varicose Veins Sister   . Stroke Maternal Grandmother   . Varicose Veins Sister     Past Surgical History:  Procedure Laterality Date  . APPLICATION OF WOUND VAC Right 07/18/2017   Procedure: APPLICATION OF WOUND VAC;  Surgeon: [name], MD;  Location: [facility];  Service: Orthopedics;  Laterality: Right;  . CATARACT EXTRACTION W/ INTRAOCULAR LENS  IMPLANT, BILATERAL    . COLONOSCOPY    . I&D EXTREMITY Right 07/18/2017   Procedure: IRRIGATION AND DEBRIDEMENT RIGHT KNEE;  Surgeon: [name], MD;  Location: [facility];  Service: Orthopedics;  Laterality: Right;  . I&D EXTREMITY Right 07/23/2017   Procedure: REPEAT IRRIGATION AND DEBRIDEMENT RIGHT KNEE;  Surgeon: [name], MD;  Location: [facility];  Service: Orthopedics;  Laterality: Right;  . KNEE ARTHROSCOPY Left    menisectomy  . MULTIPLE TOOTH EXTRACTIONS    . SKIN SPLIT GRAFT Right 07/25/2017   Procedure: Repeat Irrigation and Debridement Right Knee, Split Thickness Skin Graft;  Surgeon: [name], MD;  Location: [facility];  Service: Orthopedics;  Laterality: Right;  . TONSILLECTOMY     Social History   Occupational History    Comment: retired Marine scientist.   Tobacco  Use  . Smoking status: Former Smoker    Packs/day: 0.50    Years: 10.00    Pack years: 5.00    Last attempt to quit: 11/19/1979    Years since quitting: 37.9  . Smokeless tobacco: Never Used  Substance and Sexual Activity  . Alcohol use: Yes    Alcohol/week: 8.4 oz    Types: 14 Glasses of wine per week    Comment: 2 glasses of wine with dinner  . Drug use: No  . Sexual activity: Not on file

## 2017-11-06 ENCOUNTER — Ambulatory Visit (INDEPENDENT_AMBULATORY_CARE_PROVIDER_SITE_OTHER): Payer: Medicare Other | Admitting: Family

## 2017-11-06 ENCOUNTER — Telehealth (INDEPENDENT_AMBULATORY_CARE_PROVIDER_SITE_OTHER): Payer: Self-pay | Admitting: Radiology

## 2017-11-06 NOTE — Telephone Encounter (Signed)
I called Shelby Reese [facility] from [facility] and gave update of plan of care and wound care. She expressed understanding and also faxed to [facility] [phone]

## 2017-11-07 DIAGNOSIS — I129 Hypertensive chronic kidney disease with stage 1 through stage 4 chronic kidney disease, or unspecified chronic kidney disease: Secondary | ICD-10-CM | POA: Diagnosis not present

## 2017-11-07 DIAGNOSIS — M60861 Other myositis, right lower leg: Secondary | ICD-10-CM | POA: Diagnosis not present

## 2017-11-07 DIAGNOSIS — I872 Venous insufficiency (chronic) (peripheral): Secondary | ICD-10-CM | POA: Diagnosis not present

## 2017-11-07 DIAGNOSIS — L97211 Non-pressure chronic ulcer of right calf limited to breakdown of skin: Secondary | ICD-10-CM | POA: Diagnosis not present

## 2017-11-07 DIAGNOSIS — L89610 Pressure ulcer of right heel, unstageable: Secondary | ICD-10-CM | POA: Diagnosis not present

## 2017-11-07 DIAGNOSIS — I739 Peripheral vascular disease, unspecified: Secondary | ICD-10-CM | POA: Diagnosis not present

## 2017-11-10 DIAGNOSIS — L89610 Pressure ulcer of right heel, unstageable: Secondary | ICD-10-CM | POA: Diagnosis not present

## 2017-11-10 DIAGNOSIS — M60861 Other myositis, right lower leg: Secondary | ICD-10-CM | POA: Diagnosis not present

## 2017-11-10 DIAGNOSIS — I129 Hypertensive chronic kidney disease with stage 1 through stage 4 chronic kidney disease, or unspecified chronic kidney disease: Secondary | ICD-10-CM | POA: Diagnosis not present

## 2017-11-10 DIAGNOSIS — I739 Peripheral vascular disease, unspecified: Secondary | ICD-10-CM | POA: Diagnosis not present

## 2017-11-10 DIAGNOSIS — I872 Venous insufficiency (chronic) (peripheral): Secondary | ICD-10-CM | POA: Diagnosis not present

## 2017-11-10 DIAGNOSIS — L97211 Non-pressure chronic ulcer of right calf limited to breakdown of skin: Secondary | ICD-10-CM | POA: Diagnosis not present

## 2017-11-12 ENCOUNTER — Telehealth (INDEPENDENT_AMBULATORY_CARE_PROVIDER_SITE_OTHER): Payer: Self-pay | Admitting: Radiology

## 2017-11-12 ENCOUNTER — Emergency Department (HOSPITAL_COMMUNITY): Payer: Medicare Other

## 2017-11-12 ENCOUNTER — Emergency Department (HOSPITAL_COMMUNITY)
Admission: EM | Admit: 2017-11-12 | Discharge: 2017-11-12 | Disposition: A | Discharge status: Home / Self Care | Payer: Medicare Other | Attending: Emergency Medicine | Admitting: Emergency Medicine

## 2017-11-12 ENCOUNTER — Telehealth (INDEPENDENT_AMBULATORY_CARE_PROVIDER_SITE_OTHER): Payer: Self-pay | Admitting: Orthopedic Surgery

## 2017-11-12 ENCOUNTER — Encounter (HOSPITAL_COMMUNITY): Payer: Self-pay | Admitting: Radiology

## 2017-11-12 DIAGNOSIS — Y929 Unspecified place or not applicable: Secondary | ICD-10-CM | POA: Insufficient documentation

## 2017-11-12 DIAGNOSIS — Z7982 Long term (current) use of aspirin: Secondary | ICD-10-CM | POA: Insufficient documentation

## 2017-11-12 DIAGNOSIS — S8991XA Unspecified injury of right lower leg, initial encounter: Secondary | ICD-10-CM | POA: Diagnosis not present

## 2017-11-12 DIAGNOSIS — S0990XA Unspecified injury of head, initial encounter: Secondary | ICD-10-CM | POA: Diagnosis not present

## 2017-11-12 DIAGNOSIS — N183 Chronic kidney disease, stage 3 (moderate): Secondary | ICD-10-CM | POA: Diagnosis not present

## 2017-11-12 DIAGNOSIS — M79604 Pain in right leg: Secondary | ICD-10-CM | POA: Diagnosis not present

## 2017-11-12 DIAGNOSIS — S065X0A Traumatic subdural hemorrhage without loss of consciousness, initial encounter: Secondary | ICD-10-CM | POA: Insufficient documentation

## 2017-11-12 DIAGNOSIS — Z91040 Latex allergy status: Secondary | ICD-10-CM | POA: Insufficient documentation

## 2017-11-12 DIAGNOSIS — S065X9A Traumatic subdural hemorrhage with loss of consciousness of unspecified duration, initial encounter: Secondary | ICD-10-CM

## 2017-11-12 DIAGNOSIS — I129 Hypertensive chronic kidney disease with stage 1 through stage 4 chronic kidney disease, or unspecified chronic kidney disease: Secondary | ICD-10-CM | POA: Insufficient documentation

## 2017-11-12 DIAGNOSIS — Y999 Unspecified external cause status: Secondary | ICD-10-CM | POA: Insufficient documentation

## 2017-11-12 DIAGNOSIS — Z79899 Other long term (current) drug therapy: Secondary | ICD-10-CM | POA: Insufficient documentation

## 2017-11-12 DIAGNOSIS — I872 Venous insufficiency (chronic) (peripheral): Secondary | ICD-10-CM | POA: Diagnosis not present

## 2017-11-12 DIAGNOSIS — G8911 Acute pain due to trauma: Secondary | ICD-10-CM | POA: Diagnosis not present

## 2017-11-12 DIAGNOSIS — E039 Hypothyroidism, unspecified: Secondary | ICD-10-CM | POA: Insufficient documentation

## 2017-11-12 DIAGNOSIS — Z87891 Personal history of nicotine dependence: Secondary | ICD-10-CM | POA: Diagnosis not present

## 2017-11-12 DIAGNOSIS — S199XXA Unspecified injury of neck, initial encounter: Secondary | ICD-10-CM | POA: Diagnosis not present

## 2017-11-12 DIAGNOSIS — M7989 Other specified soft tissue disorders: Secondary | ICD-10-CM | POA: Diagnosis not present

## 2017-11-12 DIAGNOSIS — W01198A Fall on same level from slipping, tripping and stumbling with subsequent striking against other object, initial encounter: Secondary | ICD-10-CM | POA: Diagnosis not present

## 2017-11-12 DIAGNOSIS — W19XXXA Unspecified fall, initial encounter: Secondary | ICD-10-CM

## 2017-11-12 DIAGNOSIS — M60861 Other myositis, right lower leg: Secondary | ICD-10-CM | POA: Diagnosis not present

## 2017-11-12 DIAGNOSIS — Y939 Activity, unspecified: Secondary | ICD-10-CM | POA: Diagnosis not present

## 2017-11-12 DIAGNOSIS — I739 Peripheral vascular disease, unspecified: Secondary | ICD-10-CM | POA: Diagnosis not present

## 2017-11-12 DIAGNOSIS — S065XAA Traumatic subdural hemorrhage with loss of consciousness status unknown, initial encounter: Secondary | ICD-10-CM

## 2017-11-12 DIAGNOSIS — L97211 Non-pressure chronic ulcer of right calf limited to breakdown of skin: Secondary | ICD-10-CM | POA: Diagnosis not present

## 2017-11-12 DIAGNOSIS — S098XXA Other specified injuries of head, initial encounter: Secondary | ICD-10-CM | POA: Diagnosis not present

## 2017-11-12 DIAGNOSIS — M79661 Pain in right lower leg: Secondary | ICD-10-CM | POA: Diagnosis not present

## 2017-11-12 DIAGNOSIS — G4489 Other headache syndrome: Secondary | ICD-10-CM | POA: Diagnosis not present

## 2017-11-12 DIAGNOSIS — L89610 Pressure ulcer of right heel, unstageable: Secondary | ICD-10-CM | POA: Diagnosis not present

## 2017-11-12 DIAGNOSIS — S0003XA Contusion of scalp, initial encounter: Secondary | ICD-10-CM | POA: Diagnosis not present

## 2017-11-12 LAB — BASIC METABOLIC PANEL
ANION GAP: 9 (ref 5–15)
BUN: 27 mg/dL — ABNORMAL HIGH (ref 6–20)
CO2: 19 mmol/L — AB (ref 22–32)
Calcium: 9.6 mg/dL (ref 8.9–10.3)
Chloride: 104 mmol/L (ref 101–111)
Creatinine, Ser: 1.71 mg/dL — ABNORMAL HIGH (ref 0.44–1.00)
GFR calc Af Amer: 31 mL/min — ABNORMAL LOW (ref >60)
GFR calc non Af Amer: 27 mL/min — ABNORMAL LOW (ref >60)
GLUCOSE: 86 mg/dL (ref 65–99)
POTASSIUM: 4.3 mmol/L (ref 3.5–5.1)
Sodium: 132 mmol/L — ABNORMAL LOW (ref 135–145)

## 2017-11-12 LAB — CBC
HEMATOCRIT: 26.5 % — AB (ref 36.0–46.0)
Hemoglobin: 8 g/dL — ABNORMAL LOW (ref 12.0–15.0)
MCH: 25.1 pg — ABNORMAL LOW (ref 26.0–34.0)
MCHC: 30.2 g/dL (ref 30.0–36.0)
MCV: 83.1 fL (ref 78.0–100.0)
Platelets: 413 10*3/uL — ABNORMAL HIGH (ref 150–400)
RBC: 3.19 MIL/uL — AB (ref 3.87–5.11)
RDW: 17.2 % — ABNORMAL HIGH (ref 11.5–15.5)
WBC: 12.1 10*3/uL — AB (ref 4.0–10.5)

## 2017-11-12 LAB — PROTIME-INR
INR: 1.07
Prothrombin Time: 13.8 seconds (ref 11.4–15.2)

## 2017-11-12 MED ORDER — OXYCODONE-ACETAMINOPHEN 5-325 MG PO TABS: 1.0000 | ORAL_TABLET | Freq: Four times a day (QID) | ORAL | 0 refills | Status: DC | PRN

## 2017-11-12 MED ORDER — OXYCODONE-ACETAMINOPHEN 5-325 MG PO TABS
1.0000 | ORAL_TABLET | ORAL | Status: DC | PRN
  Filled 2017-11-12: qty 1

## 2017-11-12 MED ORDER — LORAZEPAM 1 MG PO TABS
1.0000 mg | ORAL_TABLET | Freq: Once | ORAL | Status: AC
  Administered 2017-11-12: 1 mg via ORAL
  Filled 2017-11-12: qty 1

## 2017-11-12 MED ORDER — OXYCODONE-ACETAMINOPHEN 5-325 MG PO TABS
1.0000 | ORAL_TABLET | Freq: Once | ORAL | Status: AC
  Administered 2017-11-12: 1 via ORAL
  Filled 2017-11-12: qty 1

## 2017-11-12 NOTE — Telephone Encounter (Signed)
Rx refill Oxycodone °

## 2017-11-12 NOTE — ED Notes (Signed)
Patient transported to CT 

## 2017-11-12 NOTE — Telephone Encounter (Signed)
I called patient trying to advise that rx for oxycodone at the front desk, written by another provider since Dr. Sharol Given is not in the office this week. I tried advising about possible surgery dates since patient will need 3 separate procedures done Wednesday, Friday, and again Wednesday. While trying to tell her the dates, she asked her husband to get her calendar. There was some difficulty locating this. Than heard a loud thud, and some frantic yelling. Patient stated she had fallen and hit her head. Her call disconnects and I have tried calling multiple times without any response. Abigail Butts has called 911 for this patient.

## 2017-11-12 NOTE — Clinical Social Work Note (Signed)
Clinical Social Work Assessment  Patient Details  Name: Shelby Reese MRN: [id number] Date of Birth: [birth year]  Date of referral:  11/12/17               Reason for consult:  [employer], Other (Comment Required)(Home Safety)                Permission sought to share information with:  Case Manager Permission granted to share information::  Yes, Verbal Permission Granted  Name::        Agency::     Relationship::     Contact Information:     Housing/Transportation Living arrangements for the past 2 months:  Single Family Home Source of Information:  Patient Patient Interpreter Needed:  None Criminal Activity/Legal Involvement Pertinent to Current Situation/Hospitalization:    Significant Relationships:  Spouse Lives with:  Spouse Do you feel safe going back to the place where you live?  Yes Need for family participation in patient care:  Yes (Comment)  Care giving concerns:  Pt expressed concerns about going home. Pt stated that husband is unable to provide the care she needs. Pt stated husband has started deteriorating.   Social Worker assessment / plan:  CSW consulted due to concerns about safety at home. CSW met with pt via bedside with case manager. Pt explained to CM and CSW her concerns about caring for herself at home. Pt stated that husband does not provide help. Pt informed that husband is deteriorating and family members all live in [place]. Pt said that she has no additional supports, stating that her neighbors will not help her. Pt expressed appreciation when CM explained additional home supports that will be contacting her. Referral placed for [facility] services. Explained that they are there to help keep her safe and healthy in the community.   Employment status:  Retired Forensic scientist:  Medicare PT Recommendations:  Not assessed at this time [place] / Referral to community resources:  Other (Comment Required)([facility] )   Patient/Family's Response to care:  Pt  agreeable to the plan of care.   Patient/Family's Understanding of and Emotional Response to Diagnosis, Current Treatment, and Prognosis:  Pt did not express any concerns or questions to the CSW at this time.  Emotional Assessment Appearance:  Appears stated age Attitude/Demeanor/Rapport:    Affect (typically observed):  Accepting, Adaptable, Appropriate Orientation:  Oriented to Self, Oriented to Situation, Oriented to Place, Oriented to  Time Alcohol / Substance use:    Psych involvement (Current and /or in the community):     Discharge Needs  Concerns to be addressed:  Care Coordination Readmission within the last 30 days:  No Current discharge risk:  None Barriers to Discharge:  No Barriers Identified   Wendelyn Breslow, LCSW 11/12/2017, 10:34 PM

## 2017-11-12 NOTE — Care Management (Signed)
ED CM consulted by [facility] RN patient was brought in by EMS for a fall at home. Patient was evaluated by EDP and cleared for discharge patient's had difficult time contacting husband by phone. ED CM and CSW met with patient at bedside, patient had visible swelling and bruising over the left eye patient states she fell at home. Patient states she will be safe at home and has [facility] services with [place], CM discussed [facility] and the benefits of the services patient is agreeable referral placed. CM will follow up and check in on patient tomorrow evening patient is appreciative for the services here.

## 2017-11-12 NOTE — ED Notes (Signed)
Pt returned from CT °

## 2017-11-12 NOTE — Discharge Instructions (Signed)
If you develop worsening headache or weakness of your arms or legs please return immediately to the ER  If your family members notice any confusion please return to the ER immediately.

## 2017-11-12 NOTE — Telephone Encounter (Signed)
Patient was called earlier by [place] and [place] heard a thud, patient said she hit her head on the wall, then the line disconnected.  Steph could not get anyone to answer when she called patient back. I called 911 and s/w dispatch to advise this happened, and they advised me that the ambulance was at the residence at that time, someone had called 911 already.

## 2017-11-12 NOTE — ED Triage Notes (Addendum)
Pt arrived via [facility] after falling and striking her head against a wall in her home. Pt denies LOC. Pt states she lost her balance and did not trip. Swelling and bruising noted above her left eye. Pt is alert and oriented x4.

## 2017-11-12 NOTE — Telephone Encounter (Signed)
Call patient when possible concerning RX refill CB # [phone]

## 2017-11-12 NOTE — ED Notes (Signed)
PT leaving via [facility]

## 2017-11-12 NOTE — ED Provider Notes (Signed)
[facility] Provider Note   CSN: [id number] Arrival date & time: 11/12/17  1621     History   Chief Complaint Chief Complaint  Patient presents with  . Fall    HPI [facility] is a 82 y.o. female.  HPI 82 year old female presents emergency department after losing her balance and falling in her house.  She fell and struck the left side of her forehead against a wall in her home.  Patient reports mild left-sided headache at this time without loss of consciousness.  Patient denies pain in her arms but does report some discomfort in her right tibia.  She denies hip pain.  She is being managed as an outpatient with a chronic wound to her right knee by Dr. Sharol Given.  She denies recent illness.  No fevers or chills.  She denies significant neck pain.  Denies back pain.  Pain is mild to moderate in severity and located in her left head as well as her right tib-fib region   Past Medical History:  Diagnosis Date  . Allergy   . Anemia   . CKD (chronic kidney disease), stage II   . Fibromyalgia   . GERD (gastroesophageal reflux disease)   . Hiatal hernia   . Hypertension   . Hypothyroid   . Lymphedema    venous insufficency  . Osteoarthritis   . Peripheral vascular disease ([place])   . Ulcer of knee ([place])    right  . Urinary incontinence   . Varicose veins   . Venous insufficiency     Patient Active Problem List   Diagnosis Date Noted  . Allergic drug rash   . Sepsis ([place]) 08/29/2017  . CKD (chronic kidney disease), stage III ([place]) 08/29/2017  . Hypothyroid 08/29/2017  . Hypertension 08/29/2017  . Peripheral vascular disease ([place]) 08/29/2017  . History of necrotic Ulcer of right knee s/p debridement and VAC tx 08/29/2017  . Acute kidney injury ([place]) 08/29/2017  . Metabolic acidosis 17/51/0258  . Acute hyponatremia 08/29/2017  . Pressure injury of skin 07/25/2017  . Wound, open, knee, lower leg, or ankle with complication, right, initial  encounter   . Idiopathic chronic venous hypertension of both lower extremities with ulcer and inflammation ([place]) 07/15/2017  . Skin ulcer of right knee with necrosis of muscle ([place]) 07/15/2017  . Atherosclerosis of artery of right lower extremity ([place]) 07/11/2017  . Essential hypertension 07/11/2017  . Osteoporosis 07/11/2017  . Cataracts, bilateral 07/11/2017  . Hypothyroidism, acquired 12/27/2014  . Anemia, iron deficiency 12/27/2014  . Hiatal hernia 12/27/2014  . Varicose veins of lower extremities with other complications 52/77/8242    Past Surgical History:  Procedure Laterality Date  . APPLICATION OF WOUND VAC Right 07/18/2017   Procedure: APPLICATION OF WOUND VAC;  Surgeon: [name], MD;  Location: [place];  Service: Orthopedics;  Laterality: Right;  . CATARACT EXTRACTION W/ INTRAOCULAR LENS  IMPLANT, BILATERAL    . COLONOSCOPY    . I&D EXTREMITY Right 07/18/2017   Procedure: IRRIGATION AND DEBRIDEMENT RIGHT KNEE;  Surgeon: [name], MD;  Location: [place];  Service: Orthopedics;  Laterality: Right;  . I&D EXTREMITY Right 07/23/2017   Procedure: REPEAT IRRIGATION AND DEBRIDEMENT RIGHT KNEE;  Surgeon: [name], MD;  Location: [place];  Service: Orthopedics;  Laterality: Right;  . KNEE ARTHROSCOPY Left    menisectomy  . MULTIPLE TOOTH EXTRACTIONS    . SKIN SPLIT GRAFT Right 07/25/2017   Procedure: Repeat Irrigation  and Debridement Right Knee, Split Thickness Skin Graft;  Surgeon: [name], MD;  Location: [place];  Service: Orthopedics;  Laterality: Right;  . TONSILLECTOMY      OB History    No data available       Home Medications    Prior to Admission medications   Medication Sig Start Date End Date Taking? Authorizing Provider  acetaminophen (TYLENOL) 500 MG tablet Take 1,000 mg by mouth daily.   Yes [provider]  aspirin EC 81 MG tablet Take 81 mg by mouth daily with breakfast.   Yes [provider]  benazepril (LOTENSIN) 40 MG tablet  Take 40 mg by mouth daily with breakfast.  03/26/13  Yes [provider]  Calcium Carbonate-Vitamin D (CALCIUM 600+D) 600-400 MG-UNIT per tablet Take 1 tablet by mouth daily at 3 pm.    Yes [provider]  cetirizine (ZYRTEC) 10 MG tablet Take 10 mg by mouth at bedtime.    Yes [provider]  diltiazem (TIAZAC) 120 MG 24 hr capsule Take 120 mg by mouth daily with breakfast.  01/29/13  Yes [provider]  diphenhydrAMINE (BENADRYL) 25 mg capsule Take 50 mg by mouth every 6 (six) hours.    Yes [provider]  famotidine (PEPCID) 40 MG tablet Take 40 mg by mouth daily at 3 pm. 1600   Yes [provider]  ferrous sulfate 325 (65 FE) MG tablet Take 1 tablet (325 mg total) by mouth 2 (two) times daily with a meal. Patient taking differently: Take 325 mg by mouth daily with breakfast.  09/04/17  Yes Rizwan, Eunice Blase, MD  ibuprofen (ADVIL,MOTRIN) 200 MG tablet Take 400 mg by mouth daily.   Yes [provider]  Magnesium Oxide (MAG-200 PO) Take 200 mg by mouth daily at 3 pm.   Yes [provider]  Multiple Vitamin (MULTIVITAMIN WITH MINERALS) TABS tablet Take 1 tablet by mouth daily.   Yes [provider]  NATURAL PSYLLIUM FIBER PO Take 1 capsule by mouth daily.   Yes [provider]  oxyCODONE-acetaminophen (PERCOCET/ROXICET) 5-325 MG tablet Take 1 tablet by mouth every 6 (six) hours as needed for severe pain. 11/12/17  Yes Lanae Crumbly, PA-C  SYNTHROID 175 MCG tablet Take 175 mcg by mouth daily before breakfast.  09/20/15  Yes [provider]  vitamin C (ASCORBIC ACID) 500 MG tablet Take 500 mg by mouth daily.   Yes [provider]  doxycycline (VIBRA-TABS) 100 MG tablet Take 1 tablet (100 mg total) 2 (two) times daily by mouth. 09/30/17   [name], MD  Gauze Pads & Dressings (KERLIX GAUZE ROLL LARGE) MISC 1 Units by Does not apply route 4 (four) times daily. 07/14/17   Suzan Slick, NP  Gauze  Pads & Dressings 4"X4-1/2" PADS 2 Units by Does not apply route 4 (four) times daily. 07/14/17   Suzan Slick, NP  hydrALAZINE (APRESOLINE) 25 MG tablet Take 1 tablet (25 mg total) by mouth every 8 (eight) hours. Patient not taking: Reported on 11/12/2017 09/04/17   Debbe Odea, MD  HYDROcodone-acetaminophen (NORCO/VICODIN) 5-325 MG tablet Take 1 tablet by mouth every 6 (six) hours as needed for moderate pain. Patient not taking: Reported on 11/12/2017 10/29/17   [name], MD  hydrocortisone cream 0.5 % Apply topically 3 (three) times daily. Patient not taking: Reported on 11/12/2017 09/04/17   Debbe Odea, MD  predniSONE (DELTASONE) 20 MG tablet Take 2 tablets (40 mg total) by  mouth daily with breakfast. 40 mg on 10/17, 20 mg on 10/18, 10 mg on 10/19 and then 5mg  on 10/20 Patient not taking: Reported on 11/12/2017 09/05/17   Debbe Odea, MD  silver sulfADIAZINE (SILVADENE) 1 % cream Apply 1 application topically daily. Patient not taking: Reported on 11/12/2017 09/04/17   Debbe Odea, MD    Family History Family History  Problem Relation Age of Onset  . Stroke Mother   . Varicose Veins Mother   . Cancer Father        prostate  . Stroke Sister   . Heart disease Sister   . Varicose Veins Sister   . Stroke Maternal Grandmother   . Varicose Veins Sister     Social History Social History   Tobacco Use  . Smoking status: Former Smoker    Packs/day: 0.50    Years: 10.00    Pack years: 5.00    Last attempt to quit: 11/19/1979    Years since quitting: 38.0  . Smokeless tobacco: Never Used  Substance Use Topics  . Alcohol use: Yes    Alcohol/week: 8.4 oz    Types: 14 Glasses of wine per week    Comment: 2 glasses of wine with dinner  . Drug use: No     Allergies   Latex; Bactrim [sulfamethoxazole-trimethoprim]; Ciprofloxacin; Diovan [valsartan]; Food; Nitrofuran derivatives; Penicillins; Sulfa antibiotics; and Other   Review of Systems Review of Systems  All  other systems reviewed and are negative.    Physical Exam Updated Vital Signs BP (!) 165/57   Pulse 91   SpO2 98%   Physical Exam  Constitutional: She is oriented to person, place, and time. She appears well-developed and well-nourished.  HENT:  Head: Normocephalic.  Left forehead and left periorbital edema and ecchymosis.  Extraocular movements are normal.  No trismus or malocclusion.  Eyes: EOM are normal.  Neck: Normal range of motion.  Pulmonary/Chest: Effort normal.  Abdominal: She exhibits no distension.  Musculoskeletal: Normal range of motion.  Full range of motion of bilateral hips, knees.  Right lower extremity wound was undressed.  No secondary signs of infection.  No skin tear is noted.  No obvious deformity of the right tib-fib.  Neurological: She is alert and oriented to person, place, and time.  Psychiatric: She has a normal mood and affect.  Nursing note and vitals reviewed.    ED Treatments / Results  Labs (all labs ordered are listed, but only abnormal results are displayed) Labs Reviewed  CBC - Abnormal; Notable for the following components:      Result Value   WBC 12.1 (*)    RBC 3.19 (*)    Hemoglobin 8.0 (*)    HCT 26.5 (*)    MCH 25.1 (*)    RDW 17.2 (*)    Platelets 413 (*)    All other components within normal limits  BASIC METABOLIC PANEL - Abnormal; Notable for the following components:   Sodium 132 (*)    CO2 19 (*)    BUN 27 (*)    Creatinine, Ser 1.71 (*)    GFR calc non Af Amer 27 (*)    GFR calc Af Amer 31 (*)    All other components within normal limits  PROTIME-INR    EKG  EKG Interpretation None       Radiology Dg Tibia/fibula Right  Result Date: 11/12/2017 CLINICAL DATA:  Fall, pain.  Swelling and bruising above left eye. EXAM: RIGHT TIBIA AND FIBULA - 2  VIEW COMPARISON:  None. FINDINGS: There is no evidence of fracture or other focal bone lesions. Soft tissues are unremarkable. IMPRESSION: No fracture or dislocation.  Electronically Signed   By: Franki Cabot M.D.   On: 11/12/2017 17:06   Ct Head Wo Contrast  Result Date: 11/12/2017 CLINICAL DATA:  Follow-up possible subdural hemorrhage EXAM: CT HEAD WITHOUT CONTRAST TECHNIQUE: Contiguous axial images were obtained from the base of the skull through the vertex without intravenous contrast. COMPARISON:  11/12/2017 FINDINGS: Brain: No acute territorial infarction or intracranial mass is visualized. No significant change in size or appearance of the subtle, thin 1 mm hyperdensity along the left parietal temporal convexity. Atrophy and mild small vessel ischemic changes. Stable ventricle size. Vascular: No hyperdense vessels.  Carotid artery calcification. Skull: No depressed skull fracture Sinuses/Orbits: Left frontal sinus opacification. Mild mucosal thickening in the ethmoid and maxillary sinuses. Other: Left forehead and periorbital hematoma. Small right frontal parietal scalp swelling. IMPRESSION: No significant interval change in size or appearance of the subtle thin 1 mm hyperdensity along the left parietal temporal convexity, suspected to represent trace subdural blood. No new abnormalities are evident. Electronically Signed   By: Donavan Foil M.D.   On: 11/12/2017 21:01   Ct Head Wo Contrast  Result Date: 11/12/2017 CLINICAL DATA:  Hit head EXAM: CT HEAD WITHOUT CONTRAST CT CERVICAL SPINE WITHOUT CONTRAST TECHNIQUE: Multidetector CT imaging of the head and cervical spine was performed following the standard protocol without intravenous contrast. Multiplanar CT image reconstructions of the cervical spine were also generated. COMPARISON:  06/28/2015 FINDINGS: CT HEAD FINDINGS Brain: No acute territorial infarction, or intracranial mass is visualized. Suspect trace left temporal, parietal convexity sub dural hematoma, 1 mm thick, series 6, image number 37 through 41. No significant mass effect or midline shift. Moderate atrophy. Mild small vessel ischemic changes of  the white matter. Stable ventricle size. Vascular: No hyperdense vessels.  Carotid artery calcification. Skull: No depressed skull fracture. Sinuses/Orbits: Opacified left frontal sinus. Mild mucosal thickening in the ethmoid and maxillary sinuses. No acute orbital abnormality. Other: Large left forehead and supraorbital soft tissue hematoma. Smaller right frontal/parietal scalp hematoma. CT CERVICAL SPINE FINDINGS Alignment: Trace retrolisthesis of C4 on C5. Facet alignment is within normal limits. Skull base and vertebrae: No acute fracture. No primary bone lesion or focal pathologic process. Soft tissues and spinal canal: No prevertebral fluid or swelling. No visible canal hematoma. Disc levels: Moderate severe degenerative changes at C4-C5, C5-C6 and C6-C7. Multiple level bilateral facet arthropathy. Upper chest: Lung apices are clear. Carotid artery calcification. 2.1 cm low-density mass in the left lobe of the thyroid. Other: None IMPRESSION: 1. Findings suspicious for trace, 1 mm thick left convexity subdural hematoma. No midline shift or mass effect. 2. Large left forehead soft tissue hematoma and smaller right frontal/parietal scalp hematoma. 3. Atrophy and mild small vessel ischemic changes of the white matter 4. Trace retrolisthesis of C4 on C5, likely degenerative. No fracture seen. Critical Value/emergent results were called by telephone at the time of interpretation on 11/12/2017 at 5:07 pm to Dr. Jola Schmidt , who verbally acknowledged these results. Electronically Signed   By: Donavan Foil M.D.   On: 11/12/2017 17:07   Ct Cervical Spine Wo Contrast  Result Date: 11/12/2017 CLINICAL DATA:  Hit head EXAM: CT HEAD WITHOUT CONTRAST CT CERVICAL SPINE WITHOUT CONTRAST TECHNIQUE: Multidetector CT imaging of the head and cervical spine was performed following the standard protocol without intravenous contrast. Multiplanar CT image reconstructions of the  cervical spine were also generated. COMPARISON:   06/28/2015 FINDINGS: CT HEAD FINDINGS Brain: No acute territorial infarction, or intracranial mass is visualized. Suspect trace left temporal, parietal convexity sub dural hematoma, 1 mm thick, series 6, image number 37 through 41. No significant mass effect or midline shift. Moderate atrophy. Mild small vessel ischemic changes of the white matter. Stable ventricle size. Vascular: No hyperdense vessels.  Carotid artery calcification. Skull: No depressed skull fracture. Sinuses/Orbits: Opacified left frontal sinus. Mild mucosal thickening in the ethmoid and maxillary sinuses. No acute orbital abnormality. Other: Large left forehead and supraorbital soft tissue hematoma. Smaller right frontal/parietal scalp hematoma. CT CERVICAL SPINE FINDINGS Alignment: Trace retrolisthesis of C4 on C5. Facet alignment is within normal limits. Skull base and vertebrae: No acute fracture. No primary bone lesion or focal pathologic process. Soft tissues and spinal canal: No prevertebral fluid or swelling. No visible canal hematoma. Disc levels: Moderate severe degenerative changes at C4-C5, C5-C6 and C6-C7. Multiple level bilateral facet arthropathy. Upper chest: Lung apices are clear. Carotid artery calcification. 2.1 cm low-density mass in the left lobe of the thyroid. Other: None IMPRESSION: 1. Findings suspicious for trace, 1 mm thick left convexity subdural hematoma. No midline shift or mass effect. 2. Large left forehead soft tissue hematoma and smaller right frontal/parietal scalp hematoma. 3. Atrophy and mild small vessel ischemic changes of the white matter 4. Trace retrolisthesis of C4 on C5, likely degenerative. No fracture seen. Critical Value/emergent results were called by telephone at the time of interpretation on 11/12/2017 at 5:07 pm to Dr. Jola Schmidt , who verbally acknowledged these results. Electronically Signed   By: Donavan Foil M.D.   On: 11/12/2017 17:07    Procedures Procedures (including critical care  time)  Medications Ordered in ED Medications  oxyCODONE-acetaminophen (PERCOCET/ROXICET) 5-325 MG per tablet 1 tablet (not administered)  oxyCODONE-acetaminophen (PERCOCET/ROXICET) 5-325 MG per tablet 1 tablet (1 tablet Oral Given 11/12/17 1636)  LORazepam (ATIVAN) tablet 1 mg (1 mg Oral Given 11/12/17 1828)     Initial Impression / Assessment and Plan / ED Course  I have reviewed the triage vital signs and the nursing notes.  Pertinent labs & imaging results that were available during my care of the patient were reviewed by me and considered in my medical decision making (see chart for details).     Small extra-axial fluid concerning for possible small left-sided subdural.  Case was discussed with Dr. Saintclair Halsted of neurosurgery who reviewed the images and recommends repeat CT imaging 4 hours after initial imaging.  He reports if this is unchanged she can safely be discharged from the emergency department.  Patient's headache is improved.  C-spine is cleared radiographically.  Right lower extremity has a chronic appearance but no acute abnormalities noted.  Pain improved.  Strict head injury warnings given as well as written on the discharge paperwork.  She is calling family members for a ride home.  Final Clinical Impressions(s) / ED Diagnoses   Final diagnoses:  Fall, initial encounter  Injury of head, initial encounter  Subdural hematoma [facility])    ED Discharge Orders    None       Jola Schmidt, MD 11/12/17 2148

## 2017-11-12 NOTE — Progress Notes (Signed)
CSW met with pt with the Case Manager. Discussed safety at home and continued home health care. Informed pt that there will be referral placed for [facility] and that additional supports will be involved with that. Reminded pt that she will be receiving a phone call from [facility] to schedule a time for them to come to the home.   Pt discharged to home.   Wendelyn Breslow, [facility]  [phone]

## 2017-11-14 ENCOUNTER — Telehealth (INDEPENDENT_AMBULATORY_CARE_PROVIDER_SITE_OTHER): Payer: Self-pay | Admitting: Orthopedic Surgery

## 2017-11-14 DIAGNOSIS — I872 Venous insufficiency (chronic) (peripheral): Secondary | ICD-10-CM | POA: Diagnosis not present

## 2017-11-14 DIAGNOSIS — L97211 Non-pressure chronic ulcer of right calf limited to breakdown of skin: Secondary | ICD-10-CM | POA: Diagnosis not present

## 2017-11-14 DIAGNOSIS — I129 Hypertensive chronic kidney disease with stage 1 through stage 4 chronic kidney disease, or unspecified chronic kidney disease: Secondary | ICD-10-CM | POA: Diagnosis not present

## 2017-11-14 DIAGNOSIS — L89610 Pressure ulcer of right heel, unstageable: Secondary | ICD-10-CM | POA: Diagnosis not present

## 2017-11-14 DIAGNOSIS — I739 Peripheral vascular disease, unspecified: Secondary | ICD-10-CM | POA: Diagnosis not present

## 2017-11-14 DIAGNOSIS — M60861 Other myositis, right lower leg: Secondary | ICD-10-CM | POA: Diagnosis not present

## 2017-11-14 NOTE — Telephone Encounter (Signed)
I called and left a message on Ubaldo Glassing' voicemail stating for her to call me back to discuss.  I spoke with Ms. Orris earlier and we scheduled her first surgery on Wed 11/19/2017.

## 2017-11-14 NOTE — Telephone Encounter (Signed)
Error- encountered other message °

## 2017-11-14 NOTE — Telephone Encounter (Signed)
Alexis with [facility] is calling in regards to patients fall. Supposed to have knee surgery, but hasnt been scheduled yet. Needs to know when surgery is to help transition to facility. Really needs a call back before the weekend because patient has some concerns.  CB # for [facility] [phone]

## 2017-11-17 ENCOUNTER — Telehealth (INDEPENDENT_AMBULATORY_CARE_PROVIDER_SITE_OTHER): Payer: Self-pay | Admitting: Radiology

## 2017-11-17 ENCOUNTER — Telehealth (INDEPENDENT_AMBULATORY_CARE_PROVIDER_SITE_OTHER): Payer: Self-pay | Admitting: Orthopedic Surgery

## 2017-11-17 ENCOUNTER — Other Ambulatory Visit (INDEPENDENT_AMBULATORY_CARE_PROVIDER_SITE_OTHER): Payer: Self-pay | Admitting: Family

## 2017-11-17 ENCOUNTER — Encounter (HOSPITAL_COMMUNITY): Payer: Self-pay | Admitting: Emergency Medicine

## 2017-11-17 ENCOUNTER — Other Ambulatory Visit: Payer: Self-pay

## 2017-11-17 DIAGNOSIS — I129 Hypertensive chronic kidney disease with stage 1 through stage 4 chronic kidney disease, or unspecified chronic kidney disease: Secondary | ICD-10-CM | POA: Diagnosis not present

## 2017-11-17 DIAGNOSIS — L89610 Pressure ulcer of right heel, unstageable: Secondary | ICD-10-CM | POA: Diagnosis not present

## 2017-11-17 DIAGNOSIS — I739 Peripheral vascular disease, unspecified: Secondary | ICD-10-CM | POA: Diagnosis not present

## 2017-11-17 DIAGNOSIS — M60861 Other myositis, right lower leg: Secondary | ICD-10-CM | POA: Diagnosis not present

## 2017-11-17 DIAGNOSIS — I872 Venous insufficiency (chronic) (peripheral): Secondary | ICD-10-CM | POA: Diagnosis not present

## 2017-11-17 DIAGNOSIS — L97211 Non-pressure chronic ulcer of right calf limited to breakdown of skin: Secondary | ICD-10-CM | POA: Diagnosis not present

## 2017-11-17 NOTE — Progress Notes (Signed)
I called Baird Lyons scheduler for Dr Sharol Given around 0900 this am and left a voice message that patient needs orders and that CBG drawn on 11/12/17 was 8.0.  I had sent NP, Dondra Prader, an in box message after hours on Friday, December  28 with the same information.

## 2017-11-17 NOTE — Telephone Encounter (Signed)
Was this taken care of?

## 2017-11-17 NOTE — Progress Notes (Signed)
Notified Dr. Sharol Given that per Dr. Pennie Banter office that patient has been referred to a hematologist.

## 2017-11-17 NOTE — Telephone Encounter (Signed)
I called [facility] to discuss her surgery that we have scheduled for Wed 12/07/2017 and the abnormal pre-op lab work.  Advised that per the notes, Dr. Pennie Banter office was going to refer her to a hematologist to determine why her labs were abnormal.  Advised that we could proceed with surgery as scheduled if she would like, with minimal blood loss expected due to the type of procedure scheduled.  [facility] stated that her hemoglobin has been abnormal for quite a while and everyone "keeps passing the buck" and not doing anything to help her.  She does not want to proceed with any type of surgery until she is evaluated by the hematologist.  She does not want to go into the hospital in a "declined state" and not have a good outcome after surgery.  Her plan is to have labs drawn at Dr. Pennie Banter office on Thursday 11/20/2017 and then see the hematologist.  I asked [facility] to please contact me after her appointment to discuss the plan for her knee wound.

## 2017-11-17 NOTE — Progress Notes (Signed)
Spoke with Shelby Reese at Dr Jess Barters office informed her that the office needs to attempt to call the pt. I did leave a message for the pt to call us back.

## 2017-11-17 NOTE — Progress Notes (Signed)
Spoke with Keenan Bachelor at Dr Pennie Banter office informed of Dr Jess Barters request for [facility] to follow up with her PCP due to HGB of 8.0. States that they will send a message about this for the Dr.

## 2017-11-17 NOTE — Telephone Encounter (Signed)
Patient has been referred to hematologist, they did call her PCP.

## 2017-11-17 NOTE — Telephone Encounter (Signed)
Lisabeth Pick called stating that the patients hemoglobin was 8.0 on 11/12/2017---and wanted to know what Dr. Sharol Given wanted to do-----I spoke with Dr. Sharol Given he said she needed to see her PCP prior to surgery to see why she is anemic--- I advised trina of this and she said she would call the patient.  ----Lisabeth Pick called me back and said she had tried to reach the patient and that she had to leave her a voicemail to call her back however Lisabeth Pick is leaving for the day soon and she wanted to let our office know and for [place] to keep trying to reach the patient and let her know about this.

## 2017-11-17 NOTE — Telephone Encounter (Signed)
Ok thanks 

## 2017-11-17 NOTE — Progress Notes (Addendum)
Spoke with [facility]ese at [facility] office informed of HGB 8.0 on 11-12-17 she informed Shelby [facility] and states to let the pt know she needs to follow up with her PCP before surgery, informed surgery is 11-19-17 Shelby [facility] states we may have to postpone surgery. Attempted to call Shelby Reese to inform her of the need to follow up with her PCP message left for her to return call.

## 2017-11-17 NOTE — Progress Notes (Signed)
Anesthesia Chart Review: SAME DAY WORK-UP.  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 She will likely need skin grafting next week. (She fell in July and subsequently developed a necrotic wound of her anterior right knee. She is s/p I& on 07/18/17, 07/23/17, and 07/25/17.)  PAT RN did not initially bring chart for anesthesia review because it was anticipated that patient's surgery was to be postponed until she could be seen by her PCP Dr. [name] for anemia (H/H 8.0/26.5 on 11/12/17, down from 10.3/34.0 on 09/09/17; but previously HGB 8.0-9.1 from labs dating back to 08/29/17) per telephone encounter by Fleeta Emmer at 14:43 today. However, I received a phone call at 4:54 PM today from [facility] at Dr. Jess Barters office reporting that Dr. Sharol Given was planning to proceed with patient's surgery on 11/19/17 as she has a significant right knee wound that places her at risk for limb loss. She had already postponed surgery until after the holidays (per his 11/05/17 note). Dr. Shelia Media is referring patient to hematology, but nothing has been scheduled yet. Can have blood available as needed. Post-operative hospital admission is anticipted.    I will make sure T&S and Prepare 2 Units PRBC orders are in. However, I advised Malachy Mood that since patient is a same day work-up that surgery could still be delayed or cancelled if patient with symptomatic anemia.   Other history includes former smoker (quit '81), hypothyroidism, venous insufficiency/varicose veins, HTN, CKD II, hiatal hernia, GERD, fibromyalgia, anemia, tonsillectomy. Notes indicate she has a history of falls with last ED visit on 11/12/17. Head CT showed small extra-axial fluid concerning for possible small left-sided subdural. ED MD discussed with neurosurgeon Dr. Saintclair Halsted who recommended repeat CT in 4 hours and if unchanged she could be safely be discharged home.  Follow-up head CT was stable and headaches improved. C-spine cleared. No new RLE injuries noted.  Med list ASA 81 mg, benazepril, Zyrtec, diltiazem, Pepcid, ferrous sulfate, hydralazine PRN SBP > 140 or DBP > 90, Synthroid.  EKG 08/29/17: ST at 104 bpm, borderline LAD, borderline repolarization abnormality.   CT head/c-spine 11/12/17: IMPRESSION: 1. Findings suspicious for trace, 1 mm thick left convexity subdural hematoma. No midline shift or mass effect. 2. Large left forehead soft tissue hematoma and smaller right frontal/parietal scalp hematoma. 3. Atrophy and mild small vessel ischemic changes of the white matter 4. Trace retrolisthesis of C4 on C5, likely degenerative. No fracture seen. CT head (follow-up) 11/12/17: IMPRESSION: No significant interval change in size or appearance of the subtle thin 1 mm hyperdensity along the left parietal temporal convexity, suspected to represent trace subdural blood. No new abnormalities are evident.  CXR 08/29/17: IMPRESSION: Findings suggesting mild CHF with mild interstitial edema. Mild pneumonitis cannot be excluded.  She will need repeat labs on arrival.  JUST PRIOR TO COMPLETING NOTE, [place]. AFTER FURTHER DISCUSSION WITH DR. DUDA, PATIENT HAS OPTED TO POSTPONE SURGERY. SHE IS GETTING LABS REPEATED ON THURSDAY 11/20/17 AT DR. PHARR'S OFFICE. AT THIS POINT, SHE WOULD PREFER TO SEE THE HEMATOLOGIST FIRST.   [facility] Stay Center/Anesthesiology Phone [phone] 11/17/2017 5:37 PM

## 2017-11-19 ENCOUNTER — Telehealth (INDEPENDENT_AMBULATORY_CARE_PROVIDER_SITE_OTHER): Payer: Self-pay | Admitting: Orthopedic Surgery

## 2017-11-19 ENCOUNTER — Telehealth (INDEPENDENT_AMBULATORY_CARE_PROVIDER_SITE_OTHER): Payer: Self-pay

## 2017-11-19 ENCOUNTER — Telehealth: Payer: Self-pay | Admitting: Hematology and Oncology

## 2017-11-19 ENCOUNTER — Other Ambulatory Visit: Payer: Self-pay | Admitting: *Deleted

## 2017-11-19 ENCOUNTER — Encounter: Payer: Self-pay | Admitting: *Deleted

## 2017-11-19 ENCOUNTER — Inpatient Hospital Stay: Admission: RE | Admit: 2017-11-19 | Payer: Medicare Other | Source: Ambulatory Visit | Admitting: Orthopedic Surgery

## 2017-11-19 DIAGNOSIS — I129 Hypertensive chronic kidney disease with stage 1 through stage 4 chronic kidney disease, or unspecified chronic kidney disease: Secondary | ICD-10-CM | POA: Diagnosis not present

## 2017-11-19 DIAGNOSIS — L97211 Non-pressure chronic ulcer of right calf limited to breakdown of skin: Secondary | ICD-10-CM | POA: Diagnosis not present

## 2017-11-19 DIAGNOSIS — L89610 Pressure ulcer of right heel, unstageable: Secondary | ICD-10-CM | POA: Diagnosis not present

## 2017-11-19 DIAGNOSIS — I739 Peripheral vascular disease, unspecified: Secondary | ICD-10-CM | POA: Diagnosis not present

## 2017-11-19 DIAGNOSIS — I872 Venous insufficiency (chronic) (peripheral): Secondary | ICD-10-CM | POA: Diagnosis not present

## 2017-11-19 DIAGNOSIS — M60861 Other myositis, right lower leg: Secondary | ICD-10-CM | POA: Diagnosis not present

## 2017-11-19 SURGERY — IRRIGATION AND DEBRIDEMENT EXTREMITY
Anesthesia: General | Laterality: Right

## 2017-11-19 NOTE — Telephone Encounter (Signed)
Shelby Reese  [phone]  Please call to discuss pt with [facility]is from [facility]

## 2017-11-19 NOTE — Telephone Encounter (Signed)
I called Alexis about patient. There is a lot of confusion about surgery. Wanting to know what exactly is going on. She has hematology appointment on Monday. Questions surgery will post pone until after appointment. Will discuss with Dr. Sharol Given and Malachy Mood tomorrow.

## 2017-11-19 NOTE — Progress Notes (Signed)
Attempted to call the pt and go over surgical instructions. Pt sts that her surgery had been cancelled, and there was no rescheduled date, as yet. Call placed to Dr. Jess Barters office, and Sherrie informed me that the surgery is cancelled, and she will call to have it removed from the schedule. Will relay to the pt.

## 2017-11-19 NOTE — Telephone Encounter (Signed)
Spoke with patient regarding appointment D/T/Loc/Ph# also mailed schedule to patient per her request

## 2017-11-19 NOTE — Telephone Encounter (Signed)
Patient would a Rx for Hydrocodone.  Cb# is [phone].  Please advise.  Thank you.

## 2017-11-19 NOTE — Patient Outreach (Signed)
[facility]) Care Management  11/19/2017  Shelby Reese July 08, 1935 [id number]    RN spoke with pt today and introduced the [facility] program and services. Further discussed the purpose for today's call and inquired further on pt's ongoing recent ED/hospital visits. Pt reports a fall back in June to her right knee where she is currently pending surgery clearance and has a wound care nurse with [facility]. Pt states several efforts have been put forth to her providers on the urgency for surgery due to the past debridements however pending. RN inquired about safety in the home and fall prevention. Pt states she was not remove the rugs currently in her home and states it's safe. 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 Pt opt to decline services at this time with no needs she feels needs to be addressed. RN informed pt that her primary provider will be notified of her disposition with [facility] services. RN has informed pt that [facility] has social workers, and pharmacy to assist with most of her needs if needed in the future. Pt very appreciative and grateful for the call today however declined services.  Raina Mina, RN Care Management Coordinator [place] Office [phone]

## 2017-11-20 ENCOUNTER — Other Ambulatory Visit (INDEPENDENT_AMBULATORY_CARE_PROVIDER_SITE_OTHER): Payer: Self-pay | Admitting: Orthopedic Surgery

## 2017-11-20 DIAGNOSIS — D649 Anemia, unspecified: Secondary | ICD-10-CM | POA: Diagnosis not present

## 2017-11-20 MED ORDER — OXYCODONE-ACETAMINOPHEN 5-325 MG PO TABS: 1.0000 | ORAL_TABLET | Freq: Four times a day (QID) | ORAL | 0 refills | Status: DC | PRN

## 2017-11-20 NOTE — Telephone Encounter (Signed)
I called patient twice this afternoon to follow-up on recent conversations.  As per my initial conversation with the patient proceeding with the revision split-thickness skin graft is not urgent and feel that it is medically prudent to workup her anemia and proceed with surgery once her anemia workup is underway.  Will follow up with the patient next week and plan to schedule surgery next week.

## 2017-11-20 NOTE — Telephone Encounter (Signed)
rx writen 

## 2017-11-20 NOTE — Telephone Encounter (Signed)
I called and lm on vm to advise that we have cancelled the surgery on Friday and are tentatively sch for Wednesday pending approval from hematology after her appt with them on Monday. To call with questions. Have also advised Malachy Mood and she is aware.

## 2017-11-20 NOTE — Telephone Encounter (Signed)
I called patient about prescription at front desk, her husband did pick up. I called about surgery and discussed since she did express concerns about her hemoglobin we would proceed after hematology appointment on Monday, to see what needs to be done and find out why her hemoglobin is the number it is. She was very upset by this response and feels we are passing the buck by not just letting her be transfused. I advised that is not our intention and hematology is not something we specialize in, and that we would like her to just have the best care so she can get better. She is fairly confident in what she believes needs to be done, and believes we do not care about her hemoglobin. I apologized she felt this way and advised you would call her to discuss further.

## 2017-11-20 NOTE — Telephone Encounter (Signed)
Pt is requesting pain medication. Last refill was for percocet #30 on 11/12/17. narc report for review #[phone] since 10/17/17 please advise.

## 2017-11-20 NOTE — Telephone Encounter (Signed)
Please advise rx is at the front desk. thanks

## 2017-11-21 ENCOUNTER — Inpatient Hospital Stay (HOSPITAL_COMMUNITY): Admission: RE | Admit: 2017-11-21 | Payer: Medicare Other | Source: Ambulatory Visit | Admitting: Orthopedic Surgery

## 2017-11-21 ENCOUNTER — Telehealth (INDEPENDENT_AMBULATORY_CARE_PROVIDER_SITE_OTHER): Payer: Self-pay | Admitting: Radiology

## 2017-11-21 ENCOUNTER — Encounter (HOSPITAL_COMMUNITY): Admission: RE | Payer: Self-pay | Source: Ambulatory Visit

## 2017-11-21 DIAGNOSIS — I129 Hypertensive chronic kidney disease with stage 1 through stage 4 chronic kidney disease, or unspecified chronic kidney disease: Secondary | ICD-10-CM | POA: Diagnosis not present

## 2017-11-21 DIAGNOSIS — L97211 Non-pressure chronic ulcer of right calf limited to breakdown of skin: Secondary | ICD-10-CM | POA: Diagnosis not present

## 2017-11-21 DIAGNOSIS — I739 Peripheral vascular disease, unspecified: Secondary | ICD-10-CM | POA: Diagnosis not present

## 2017-11-21 DIAGNOSIS — M60861 Other myositis, right lower leg: Secondary | ICD-10-CM | POA: Diagnosis not present

## 2017-11-21 DIAGNOSIS — I872 Venous insufficiency (chronic) (peripheral): Secondary | ICD-10-CM | POA: Diagnosis not present

## 2017-11-21 DIAGNOSIS — L89610 Pressure ulcer of right heel, unstageable: Secondary | ICD-10-CM | POA: Diagnosis not present

## 2017-11-21 SURGERY — IRRIGATION AND DEBRIDEMENT EXTREMITY
Anesthesia: General | Laterality: Right

## 2017-11-21 NOTE — Telephone Encounter (Signed)
Shelby Reese [facility] from [facility]. She is with patient now. Wanting to know if Dr. Sharol Given can speak with Dr. Pennie Banter office to discuss if hematology appointment is necessary. It has been expressed why hematology should be involved, but the patient is insistent on being admitted and transfused. I understand patients frustration with this situation, but advised it is not my call to make. Advised Dr. Sharol Given is in surgery now and remainder of day. I would definitely let him know. Their office is closed at 12pm today.

## 2017-11-21 NOTE — Telephone Encounter (Signed)
I called the patient back regarding her surgery.  I reviewed the sequence of events discussed that initially with her hemoglobin of 8.0 that we wanted to have her evaluated by hematologist to understand the cause of her anemia, and hold on surgery.  Patient initially stated that she did not want to have a consultation she states that her anemia is normal and that she wanted to proceed with surgery since her hemoglobin was 8.0.  We did cancel the surgery did not feel that it was safe to proceed with surgery with a hemoglobin of 8.0 and patient was set up with a hematology oncology consultation for this Monday.  I discussed with the patient that we would proceed with surgery once her hemoglobin was worked up and she was stable.  Patient states that this time she does not want to consider surgery until her hemoglobin is back above 8 she states she wants a transfusion immediately.  I discussed that this would be under the decision process of the hematologist oncologist and that I would not intervene in this care.  Discussed that we would be in contact after her hematology oncology consultation and would proceed with surgery once her hemoglobin is stabilized.  I discussed that this was our  initial decision process to evaluate the hemoglobin, stabilize the patient, and then proceed with surgery.  Patient feels like she has been getting the run around and the decision process is changing

## 2017-11-24 ENCOUNTER — Telehealth (INDEPENDENT_AMBULATORY_CARE_PROVIDER_SITE_OTHER): Payer: Self-pay | Admitting: Orthopedic Surgery

## 2017-11-24 DIAGNOSIS — I739 Peripheral vascular disease, unspecified: Secondary | ICD-10-CM | POA: Diagnosis not present

## 2017-11-24 DIAGNOSIS — I872 Venous insufficiency (chronic) (peripheral): Secondary | ICD-10-CM | POA: Diagnosis not present

## 2017-11-24 DIAGNOSIS — L97211 Non-pressure chronic ulcer of right calf limited to breakdown of skin: Secondary | ICD-10-CM | POA: Diagnosis not present

## 2017-11-24 DIAGNOSIS — I129 Hypertensive chronic kidney disease with stage 1 through stage 4 chronic kidney disease, or unspecified chronic kidney disease: Secondary | ICD-10-CM | POA: Diagnosis not present

## 2017-11-24 DIAGNOSIS — M60861 Other myositis, right lower leg: Secondary | ICD-10-CM | POA: Diagnosis not present

## 2017-11-24 DIAGNOSIS — L89610 Pressure ulcer of right heel, unstageable: Secondary | ICD-10-CM | POA: Diagnosis not present

## 2017-11-24 NOTE — Telephone Encounter (Signed)
Alexis from [facility] asking for clarifications on the patients surgery date. She canceled her appointment with [facility] today and see's her PC doctor on Wednesday. CB # [phone]

## 2017-11-25 ENCOUNTER — Telehealth (INDEPENDENT_AMBULATORY_CARE_PROVIDER_SITE_OTHER): Payer: Self-pay | Admitting: Orthopedic Surgery

## 2017-11-25 NOTE — Telephone Encounter (Signed)
I called and sw [place]. The pt would not allow her to apply compressive wrap yesterday. She states that the legs are weaping and the wound has a terrible odor. She declined coming into the office and so they called non emergent transport with EMS to go to the ER. By the time transportation arrived the pt declined to go she stated that [facility] was meddlesome and that she did not want to go. The pt cancelled her appt with Dr. Watt Climes yesterday. She was to be evaluated by GI for a bleed in order to find out why her HGB was so low and to correct this and clear her for surgery. The pt cancelled per HHN. The pt declined the skin graft and debridement of her RLE wound until the anemia was investigated. She has stated to [facility] that this surgery was cancelled by the hospital because of her lab results and this is not true. The pt had a conversation with Dr. Sharol Given and he sated that she could proceed with graft that is was a minimal blood loss procedure but the pt states that she wanted to be transfused with 3u PRBC and would not proceed with surgery until she was. Dr. Sharol Given advised that this is not something he would order and that she would need consult with hematology. This referral was made and declined first stating that she needs to have work up with PCP first and then referral to GI if hemoccult cards came back positive. HHN is discussing re certification with management today.  Due to non compliance and hostility in the home she is discussing if they will discharge pt. At her home yesterday the nurse noticed a full bottle of doxycycline and questioned why the pt had not taken it. Pt states that she was told she did not have to and that it was an old rx and then told her husband she did not like the nurse " snooping around" said that she will call the office and let us know if they will continue to see her. Pt has an appt with Dr. Shelia Media tomorrow.

## 2017-11-25 NOTE — Telephone Encounter (Signed)
Alexis called and left VM to call her back       # [phone]

## 2017-11-26 DIAGNOSIS — S81801D Unspecified open wound, right lower leg, subsequent encounter: Secondary | ICD-10-CM | POA: Diagnosis not present

## 2017-11-26 DIAGNOSIS — D649 Anemia, unspecified: Secondary | ICD-10-CM | POA: Diagnosis not present

## 2017-11-26 DIAGNOSIS — D509 Iron deficiency anemia, unspecified: Secondary | ICD-10-CM | POA: Diagnosis not present

## 2017-11-26 NOTE — Telephone Encounter (Signed)
Called and sw HHN. Advised verbal ok to change dressing to medihoney and compressive dressing. Pt will not allow her to apply profore and will not elevate her leg which is causing "more harm"  So she will apply what the pt will tolerate. The pt said there was no point for the nurse to come out today tht she was applying her own dressing because only a temporary one was applied Monday. Ubaldo Glassing advised the pt that she had applied a dressing but the pt had refused her compression wrap. She has an appt with Dr. Shelia Media today and Ubaldo Glassing will call to find out what the plan of action will be for her and her anemia. She is not taking her protein powder and nutrition is not good and this to is affecting her healing. The pt does not get up except to use the bathroom and take her medication per nursing and l eaves the leg down and dependent on the floor most of the day. She does have leg pumps htat she could use at night but does not doe this. HHN is afraid that the pt will become sick and require hospitalization due to her noncompliance and worsening of her wound. Advised to please call me and let me know what she fins out after pt's appt today and if she requires anything else.

## 2017-11-27 ENCOUNTER — Telehealth (INDEPENDENT_AMBULATORY_CARE_PROVIDER_SITE_OTHER): Payer: Self-pay

## 2017-11-27 ENCOUNTER — Other Ambulatory Visit (INDEPENDENT_AMBULATORY_CARE_PROVIDER_SITE_OTHER): Payer: Self-pay | Admitting: Family

## 2017-11-27 DIAGNOSIS — I872 Venous insufficiency (chronic) (peripheral): Secondary | ICD-10-CM | POA: Diagnosis not present

## 2017-11-27 DIAGNOSIS — M60861 Other myositis, right lower leg: Secondary | ICD-10-CM | POA: Diagnosis not present

## 2017-11-27 DIAGNOSIS — I129 Hypertensive chronic kidney disease with stage 1 through stage 4 chronic kidney disease, or unspecified chronic kidney disease: Secondary | ICD-10-CM | POA: Diagnosis not present

## 2017-11-27 DIAGNOSIS — I739 Peripheral vascular disease, unspecified: Secondary | ICD-10-CM | POA: Diagnosis not present

## 2017-11-27 DIAGNOSIS — L97211 Non-pressure chronic ulcer of right calf limited to breakdown of skin: Secondary | ICD-10-CM | POA: Diagnosis not present

## 2017-11-27 DIAGNOSIS — L89610 Pressure ulcer of right heel, unstageable: Secondary | ICD-10-CM | POA: Diagnosis not present

## 2017-11-27 NOTE — Telephone Encounter (Signed)
Yes we will proceed as per patient request.

## 2017-11-27 NOTE — Telephone Encounter (Signed)
Dr. Sharol Given spoke with Dr. Shelia Media about this patient today

## 2017-11-27 NOTE — Telephone Encounter (Signed)
Received call form Naida Sleight w/Dr. Pharr's office stating pt was seen by them yesterday and had labs done and her hemoglobin level was 8.3. Wanted Dr. Sharol Given to be aware of this and see if he thought her level had increased enough for her to have the transfusion. His direct # to his desk is [phone] or the main # to the office is [phone].

## 2017-11-27 NOTE — Telephone Encounter (Signed)
PCP checked HGB yesterday and it is an 8.3 is this high enough to proceed with surgery?

## 2017-11-28 ENCOUNTER — Encounter (HOSPITAL_COMMUNITY)
Admission: RE | Disposition: A | Discharge status: Skilled Nursing Facility | Payer: Self-pay | Source: Home / Self Care | Attending: Orthopedic Surgery

## 2017-11-28 ENCOUNTER — Inpatient Hospital Stay (HOSPITAL_COMMUNITY): Payer: Medicare Other | Admitting: Certified Registered Nurse Anesthetist

## 2017-11-28 ENCOUNTER — Inpatient Hospital Stay (HOSPITAL_COMMUNITY)
Admission: RE | Admit: 2017-11-28 | Discharge: 2017-12-08 | DRG: 575 | Disposition: A | Discharge status: Skilled Nursing Facility | Payer: Medicare Other | Attending: Orthopedic Surgery | Admitting: Orthopedic Surgery

## 2017-11-28 ENCOUNTER — Encounter (HOSPITAL_COMMUNITY): Payer: Self-pay | Admitting: *Deleted

## 2017-11-28 ENCOUNTER — Other Ambulatory Visit (INDEPENDENT_AMBULATORY_CARE_PROVIDER_SITE_OTHER): Payer: Self-pay | Admitting: Family

## 2017-11-28 DIAGNOSIS — M1990 Unspecified osteoarthritis, unspecified site: Secondary | ICD-10-CM | POA: Diagnosis present

## 2017-11-28 DIAGNOSIS — I129 Hypertensive chronic kidney disease with stage 1 through stage 4 chronic kidney disease, or unspecified chronic kidney disease: Secondary | ICD-10-CM | POA: Diagnosis not present

## 2017-11-28 DIAGNOSIS — I8391 Asymptomatic varicose veins of right lower extremity: Secondary | ICD-10-CM | POA: Diagnosis not present

## 2017-11-28 DIAGNOSIS — Z87891 Personal history of nicotine dependence: Secondary | ICD-10-CM | POA: Diagnosis not present

## 2017-11-28 DIAGNOSIS — Z881 Allergy status to other antibiotic agents status: Secondary | ICD-10-CM | POA: Diagnosis not present

## 2017-11-28 DIAGNOSIS — Z91018 Allergy to other foods: Secondary | ICD-10-CM | POA: Diagnosis not present

## 2017-11-28 DIAGNOSIS — G629 Polyneuropathy, unspecified: Secondary | ICD-10-CM | POA: Diagnosis present

## 2017-11-28 DIAGNOSIS — Z882 Allergy status to sulfonamides status: Secondary | ICD-10-CM | POA: Diagnosis not present

## 2017-11-28 DIAGNOSIS — E039 Hypothyroidism, unspecified: Secondary | ICD-10-CM | POA: Diagnosis present

## 2017-11-28 DIAGNOSIS — Z79890 Hormone replacement therapy (postmenopausal): Secondary | ICD-10-CM | POA: Diagnosis not present

## 2017-11-28 DIAGNOSIS — Z791 Long term (current) use of non-steroidal anti-inflammatories (NSAID): Secondary | ICD-10-CM | POA: Diagnosis not present

## 2017-11-28 DIAGNOSIS — Z880 Allergy status to penicillin: Secondary | ICD-10-CM

## 2017-11-28 DIAGNOSIS — S81839A Puncture wound without foreign body, unspecified lower leg, initial encounter: Secondary | ICD-10-CM | POA: Diagnosis not present

## 2017-11-28 DIAGNOSIS — I739 Peripheral vascular disease, unspecified: Secondary | ICD-10-CM | POA: Diagnosis present

## 2017-11-28 DIAGNOSIS — Z8249 Family history of ischemic heart disease and other diseases of the circulatory system: Secondary | ICD-10-CM

## 2017-11-28 DIAGNOSIS — Z91040 Latex allergy status: Secondary | ICD-10-CM

## 2017-11-28 DIAGNOSIS — Z79899 Other long term (current) drug therapy: Secondary | ICD-10-CM

## 2017-11-28 DIAGNOSIS — Z4789 Encounter for other orthopedic aftercare: Secondary | ICD-10-CM | POA: Diagnosis not present

## 2017-11-28 DIAGNOSIS — Z9181 History of falling: Secondary | ICD-10-CM | POA: Diagnosis not present

## 2017-11-28 DIAGNOSIS — S81001A Unspecified open wound, right knee, initial encounter: Secondary | ICD-10-CM | POA: Diagnosis not present

## 2017-11-28 DIAGNOSIS — K449 Diaphragmatic hernia without obstruction or gangrene: Secondary | ICD-10-CM | POA: Diagnosis not present

## 2017-11-28 DIAGNOSIS — M797 Fibromyalgia: Secondary | ICD-10-CM | POA: Diagnosis present

## 2017-11-28 DIAGNOSIS — N182 Chronic kidney disease, stage 2 (mild): Secondary | ICD-10-CM | POA: Diagnosis present

## 2017-11-28 DIAGNOSIS — D649 Anemia, unspecified: Secondary | ICD-10-CM | POA: Diagnosis not present

## 2017-11-28 DIAGNOSIS — S81001S Unspecified open wound, right knee, sequela: Secondary | ICD-10-CM

## 2017-11-28 DIAGNOSIS — Z888 Allergy status to other drugs, medicaments and biological substances status: Secondary | ICD-10-CM

## 2017-11-28 DIAGNOSIS — L8992 Pressure ulcer of unspecified site, stage 2: Secondary | ICD-10-CM | POA: Diagnosis not present

## 2017-11-28 DIAGNOSIS — L97812 Non-pressure chronic ulcer of other part of right lower leg with fat layer exposed: Principal | ICD-10-CM

## 2017-11-28 DIAGNOSIS — S81001D Unspecified open wound, right knee, subsequent encounter: Secondary | ICD-10-CM | POA: Diagnosis not present

## 2017-11-28 DIAGNOSIS — M6281 Muscle weakness (generalized): Secondary | ICD-10-CM | POA: Diagnosis not present

## 2017-11-28 DIAGNOSIS — Z7982 Long term (current) use of aspirin: Secondary | ICD-10-CM

## 2017-11-28 DIAGNOSIS — I10 Essential (primary) hypertension: Secondary | ICD-10-CM | POA: Diagnosis not present

## 2017-11-28 DIAGNOSIS — N183 Chronic kidney disease, stage 3 (moderate): Secondary | ICD-10-CM | POA: Diagnosis not present

## 2017-11-28 DIAGNOSIS — K219 Gastro-esophageal reflux disease without esophagitis: Secondary | ICD-10-CM | POA: Diagnosis present

## 2017-11-28 DIAGNOSIS — L97819 Non-pressure chronic ulcer of other part of right lower leg with unspecified severity: Secondary | ICD-10-CM | POA: Diagnosis not present

## 2017-11-28 DIAGNOSIS — R488 Other symbolic dysfunctions: Secondary | ICD-10-CM | POA: Diagnosis not present

## 2017-11-28 DIAGNOSIS — R262 Difficulty in walking, not elsewhere classified: Secondary | ICD-10-CM | POA: Diagnosis not present

## 2017-11-28 HISTORY — PX: I & D EXTREMITY: SHX5045

## 2017-11-28 LAB — BASIC METABOLIC PANEL
Anion gap: 12 (ref 5–15)
BUN: 29 mg/dL — AB (ref 6–20)
CHLORIDE: 102 mmol/L (ref 101–111)
CO2: 19 mmol/L — ABNORMAL LOW (ref 22–32)
Calcium: 9.6 mg/dL (ref 8.9–10.3)
Creatinine, Ser: 1.67 mg/dL — ABNORMAL HIGH (ref 0.44–1.00)
GFR calc Af Amer: 32 mL/min — ABNORMAL LOW (ref >60)
GFR calc non Af Amer: 28 mL/min — ABNORMAL LOW (ref >60)
Glucose, Bld: 110 mg/dL — ABNORMAL HIGH (ref 65–99)
Potassium: 4.8 mmol/L (ref 3.5–5.1)
SODIUM: 133 mmol/L — AB (ref 135–145)

## 2017-11-28 LAB — CBC
HEMATOCRIT: 27.5 % — AB (ref 36.0–46.0)
HEMOGLOBIN: 8 g/dL — AB (ref 12.0–15.0)
MCH: 23.7 pg — AB (ref 26.0–34.0)
MCHC: 29.1 g/dL — ABNORMAL LOW (ref 30.0–36.0)
MCV: 81.4 fL (ref 78.0–100.0)
Platelets: 518 10*3/uL — ABNORMAL HIGH (ref 150–400)
RBC: 3.38 MIL/uL — AB (ref 3.87–5.11)
RDW: 16.6 % — ABNORMAL HIGH (ref 11.5–15.5)
WBC: 12.8 10*3/uL — ABNORMAL HIGH (ref 4.0–10.5)

## 2017-11-28 SURGERY — IRRIGATION AND DEBRIDEMENT EXTREMITY
Anesthesia: General | Laterality: Right

## 2017-11-28 MED ORDER — HYDRALAZINE HCL 25 MG PO TABS
25.0000 mg | ORAL_TABLET | Freq: Three times a day (TID) | ORAL | Status: DC
  Administered 2017-11-29 – 2017-12-08 (×26): 25 mg via ORAL
  Filled 2017-11-28 (×29): qty 1

## 2017-11-28 MED ORDER — ONDANSETRON HCL 4 MG/2ML IJ SOLN: 4.0000 mg | Freq: Once | INTRAMUSCULAR | Status: DC | PRN

## 2017-11-28 MED ORDER — FERROUS SULFATE 325 (65 FE) MG PO TABS
325.0000 mg | ORAL_TABLET | Freq: Every day | ORAL | Status: DC
  Administered 2017-11-29 – 2017-12-08 (×8): 325 mg via ORAL
  Filled 2017-11-28 (×10): qty 1

## 2017-11-28 MED ORDER — PROPOFOL 10 MG/ML IV BOLUS
INTRAVENOUS | Status: DC | PRN
  Administered 2017-11-28: 200 mg via INTRAVENOUS

## 2017-11-28 MED ORDER — FENTANYL CITRATE (PF) 100 MCG/2ML IJ SOLN
INTRAMUSCULAR | Status: AC
  Administered 2017-11-28: 50 ug via INTRAVENOUS
  Filled 2017-11-28: qty 2

## 2017-11-28 MED ORDER — ROCURONIUM BROMIDE 10 MG/ML (PF) SYRINGE
PREFILLED_SYRINGE | INTRAVENOUS | Status: AC
  Filled 2017-11-28: qty 5

## 2017-11-28 MED ORDER — ONDANSETRON HCL 4 MG/2ML IJ SOLN: 4.0000 mg | Freq: Four times a day (QID) | INTRAMUSCULAR | Status: DC | PRN

## 2017-11-28 MED ORDER — FAMOTIDINE 20 MG PO TABS
40.0000 mg | ORAL_TABLET | Freq: Every day | ORAL | Status: DC
  Administered 2017-11-29 – 2017-12-08 (×10): 40 mg via ORAL
  Filled 2017-11-28 (×10): qty 2

## 2017-11-28 MED ORDER — EPHEDRINE 5 MG/ML INJ
INTRAVENOUS | Status: AC
  Filled 2017-11-28: qty 10

## 2017-11-28 MED ORDER — LIDOCAINE 2% (20 MG/ML) 5 ML SYRINGE
INTRAMUSCULAR | Status: AC
  Filled 2017-11-28: qty 5

## 2017-11-28 MED ORDER — DEXAMETHASONE SODIUM PHOSPHATE 10 MG/ML IJ SOLN
INTRAMUSCULAR | Status: DC | PRN
  Administered 2017-11-28: 10 mg via INTRAVENOUS

## 2017-11-28 MED ORDER — CLINDAMYCIN PHOSPHATE 900 MG/50ML IV SOLN
900.0000 mg | INTRAVENOUS | Status: AC
  Administered 2017-11-28: 900 mg via INTRAVENOUS

## 2017-11-28 MED ORDER — LACTATED RINGERS IV SOLN
INTRAVENOUS | Status: DC
  Administered 2017-11-28: 10:00:00 via INTRAVENOUS

## 2017-11-28 MED ORDER — METHOCARBAMOL 1000 MG/10ML IJ SOLN
500.0000 mg | Freq: Four times a day (QID) | INTRAVENOUS | Status: DC | PRN
  Administered 2017-12-01 – 2017-12-05 (×2): 500 mg via INTRAVENOUS
  Filled 2017-11-28 (×4): qty 5

## 2017-11-28 MED ORDER — SODIUM CHLORIDE 0.9 % IR SOLN
Status: DC | PRN
  Administered 2017-11-28: 3000 mL

## 2017-11-28 MED ORDER — DILTIAZEM HCL ER COATED BEADS 120 MG PO CP24
120.0000 mg | ORAL_CAPSULE | Freq: Every day | ORAL | Status: DC
  Administered 2017-11-29 – 2017-12-08 (×9): 120 mg via ORAL
  Filled 2017-11-28 (×15): qty 1

## 2017-11-28 MED ORDER — PHENYLEPHRINE HCL 10 MG/ML IJ SOLN
INTRAMUSCULAR | Status: DC | PRN
  Administered 2017-11-28: 80 ug via INTRAVENOUS

## 2017-11-28 MED ORDER — METOCLOPRAMIDE HCL 5 MG/ML IJ SOLN: 5.0000 mg | Freq: Three times a day (TID) | INTRAMUSCULAR | Status: DC | PRN

## 2017-11-28 MED ORDER — ONDANSETRON HCL 4 MG PO TABS: 4.0000 mg | ORAL_TABLET | Freq: Four times a day (QID) | ORAL | Status: DC | PRN

## 2017-11-28 MED ORDER — METOCLOPRAMIDE HCL 5 MG PO TABS: 5.0000 mg | ORAL_TABLET | Freq: Three times a day (TID) | ORAL | Status: DC | PRN

## 2017-11-28 MED ORDER — OXYCODONE HCL 5 MG/5ML PO SOLN: 5.0000 mg | Freq: Once | ORAL | Status: DC | PRN

## 2017-11-28 MED ORDER — OXYCODONE HCL 5 MG PO TABS
5.0000 mg | ORAL_TABLET | ORAL | Status: DC | PRN
  Administered 2017-12-03 – 2017-12-05 (×2): 5 mg via ORAL
  Filled 2017-11-28: qty 1

## 2017-11-28 MED ORDER — CHLORHEXIDINE GLUCONATE 4 % EX LIQD: 60.0000 mL | Freq: Once | CUTANEOUS | Status: DC

## 2017-11-28 MED ORDER — ASPIRIN EC 325 MG PO TBEC
325.0000 mg | DELAYED_RELEASE_TABLET | Freq: Every day | ORAL | Status: DC
  Administered 2017-11-28 – 2017-12-07 (×10): 325 mg via ORAL
  Filled 2017-11-28 (×10): qty 1

## 2017-11-28 MED ORDER — LEVOTHYROXINE SODIUM 75 MCG PO TABS
175.0000 ug | ORAL_TABLET | Freq: Every day | ORAL | Status: DC
  Administered 2017-11-29 – 2017-12-08 (×9): 175 ug via ORAL
  Filled 2017-11-28 (×10): qty 1

## 2017-11-28 MED ORDER — BISACODYL 10 MG RE SUPP: 10.0000 mg | Freq: Every day | RECTAL | Status: DC | PRN

## 2017-11-28 MED ORDER — OXYCODONE HCL 5 MG PO TABS
10.0000 mg | ORAL_TABLET | ORAL | Status: DC | PRN
  Administered 2017-11-28 – 2017-12-08 (×41): 10 mg via ORAL
  Filled 2017-11-28 (×41): qty 2

## 2017-11-28 MED ORDER — LORATADINE 10 MG PO TABS
10.0000 mg | ORAL_TABLET | Freq: Every day | ORAL | Status: DC
  Administered 2017-11-29 – 2017-12-08 (×10): 10 mg via ORAL
  Filled 2017-11-28 (×11): qty 1

## 2017-11-28 MED ORDER — HYDROMORPHONE HCL 1 MG/ML IJ SOLN
1.0000 mg | INTRAMUSCULAR | Status: DC | PRN
  Administered 2017-11-28 – 2017-12-08 (×51): 1 mg via INTRAVENOUS
  Filled 2017-11-28 (×55): qty 1

## 2017-11-28 MED ORDER — PHENYLEPHRINE 40 MCG/ML (10ML) SYRINGE FOR IV PUSH (FOR BLOOD PRESSURE SUPPORT)
PREFILLED_SYRINGE | INTRAVENOUS | Status: AC
  Filled 2017-11-28: qty 10

## 2017-11-28 MED ORDER — ACETAMINOPHEN 650 MG RE SUPP: 650.0000 mg | RECTAL | Status: DC | PRN

## 2017-11-28 MED ORDER — DOCUSATE SODIUM 100 MG PO CAPS
100.0000 mg | ORAL_CAPSULE | Freq: Two times a day (BID) | ORAL | Status: DC
  Administered 2017-11-28 – 2017-12-05 (×15): 100 mg via ORAL
  Filled 2017-11-28 (×15): qty 1

## 2017-11-28 MED ORDER — OXYCODONE HCL 5 MG PO TABS: 5.0000 mg | ORAL_TABLET | Freq: Once | ORAL | Status: DC | PRN

## 2017-11-28 MED ORDER — FENTANYL CITRATE (PF) 100 MCG/2ML IJ SOLN: 25.0000 ug | INTRAMUSCULAR | Status: DC | PRN

## 2017-11-28 MED ORDER — SUGAMMADEX SODIUM 200 MG/2ML IV SOLN
INTRAVENOUS | Status: AC
  Filled 2017-11-28: qty 2

## 2017-11-28 MED ORDER — CLINDAMYCIN PHOSPHATE 900 MG/50ML IV SOLN
INTRAVENOUS | Status: AC
  Filled 2017-11-28: qty 50

## 2017-11-28 MED ORDER — MAGNESIUM CITRATE PO SOLN
1.0000 | Freq: Once | ORAL | Status: AC | PRN
  Administered 2017-12-02: 1 via ORAL
  Filled 2017-11-28: qty 296

## 2017-11-28 MED ORDER — INFLUENZA VAC SPLIT HIGH-DOSE 0.5 ML IM SUSY
0.5000 mL | PREFILLED_SYRINGE | INTRAMUSCULAR | Status: DC
  Filled 2017-11-28: qty 0.5

## 2017-11-28 MED ORDER — METHOCARBAMOL 500 MG PO TABS
500.0000 mg | ORAL_TABLET | Freq: Four times a day (QID) | ORAL | Status: DC | PRN
  Administered 2017-11-28 – 2017-12-04 (×20): 500 mg via ORAL
  Filled 2017-11-28 (×19): qty 1

## 2017-11-28 MED ORDER — FENTANYL CITRATE (PF) 100 MCG/2ML IJ SOLN
INTRAMUSCULAR | Status: DC | PRN
  Administered 2017-11-28 (×5): 50 ug via INTRAVENOUS

## 2017-11-28 MED ORDER — SODIUM CHLORIDE 0.9 % IV SOLN
INTRAVENOUS | Status: DC
  Administered 2017-11-28 – 2017-12-04 (×2): via INTRAVENOUS

## 2017-11-28 MED ORDER — LIDOCAINE HCL (CARDIAC) 20 MG/ML IV SOLN
INTRAVENOUS | Status: DC | PRN
  Administered 2017-11-28: 50 mg via INTRAVENOUS

## 2017-11-28 MED ORDER — FENTANYL CITRATE (PF) 250 MCG/5ML IJ SOLN
INTRAMUSCULAR | Status: AC
  Filled 2017-11-28: qty 5

## 2017-11-28 MED ORDER — 0.9 % SODIUM CHLORIDE (POUR BTL) OPTIME
TOPICAL | Status: DC | PRN
  Administered 2017-11-28: 1000 mL

## 2017-11-28 MED ORDER — CLINDAMYCIN PHOSPHATE 600 MG/50ML IV SOLN
600.0000 mg | Freq: Four times a day (QID) | INTRAVENOUS | Status: AC
  Administered 2017-11-28 – 2017-11-29 (×3): 600 mg via INTRAVENOUS
  Filled 2017-11-28 (×3): qty 50

## 2017-11-28 MED ORDER — POLYETHYLENE GLYCOL 3350 17 G PO PACK
17.0000 g | PACK | Freq: Every day | ORAL | Status: DC | PRN
  Administered 2017-12-01 – 2017-12-04 (×3): 17 g via ORAL
  Filled 2017-11-28 (×3): qty 1

## 2017-11-28 MED ORDER — ACETAMINOPHEN 325 MG PO TABS
650.0000 mg | ORAL_TABLET | ORAL | Status: DC | PRN
  Administered 2017-12-01 – 2017-12-04 (×4): 650 mg via ORAL
  Filled 2017-11-28 (×4): qty 2

## 2017-11-28 MED ORDER — ONDANSETRON HCL 4 MG/2ML IJ SOLN
INTRAMUSCULAR | Status: AC
  Filled 2017-11-28: qty 2

## 2017-11-28 MED ORDER — METHOCARBAMOL 500 MG PO TABS
ORAL_TABLET | ORAL | Status: AC
  Filled 2017-11-28: qty 1

## 2017-11-28 MED ORDER — BENAZEPRIL HCL 20 MG PO TABS
40.0000 mg | ORAL_TABLET | Freq: Every day | ORAL | Status: DC
  Administered 2017-11-29 – 2017-12-08 (×9): 40 mg via ORAL
  Filled 2017-11-28 (×10): qty 2

## 2017-11-28 MED ORDER — OXYCODONE HCL 5 MG PO TABS
ORAL_TABLET | ORAL | Status: AC
  Filled 2017-11-28: qty 2

## 2017-11-28 MED ORDER — SUCCINYLCHOLINE CHLORIDE 200 MG/10ML IV SOSY
PREFILLED_SYRINGE | INTRAVENOUS | Status: AC
  Filled 2017-11-28: qty 10

## 2017-11-28 MED ORDER — DIPHENHYDRAMINE HCL 25 MG PO CAPS: 50.0000 mg | ORAL_CAPSULE | Freq: Four times a day (QID) | ORAL | Status: DC

## 2017-11-28 MED ORDER — ONDANSETRON HCL 4 MG/2ML IJ SOLN
INTRAMUSCULAR | Status: DC | PRN
  Administered 2017-11-28: 4 mg via INTRAVENOUS

## 2017-11-28 MED ORDER — FENTANYL CITRATE (PF) 100 MCG/2ML IJ SOLN
25.0000 ug | INTRAMUSCULAR | Status: DC | PRN
  Administered 2017-11-28 (×2): 50 ug via INTRAVENOUS

## 2017-11-28 MED ORDER — DEXAMETHASONE SODIUM PHOSPHATE 10 MG/ML IJ SOLN
INTRAMUSCULAR | Status: AC
  Filled 2017-11-28: qty 1

## 2017-11-28 SURGICAL SUPPLY — 43 items
APL SKNCLS STERI-STRIP NONHPOA (GAUZE/BANDAGES/DRESSINGS) ×5
BENZOIN TINCTURE PRP APPL 2/3 (GAUZE/BANDAGES/DRESSINGS) ×10 IMPLANT
BLADE SURG 21 STRL SS (BLADE) ×1 IMPLANT
BNDG COHESIVE 6X5 TAN STRL LF (GAUZE/BANDAGES/DRESSINGS) ×2 IMPLANT
BNDG GAUZE ELAST 4 BULKY (GAUZE/BANDAGES/DRESSINGS) ×4 IMPLANT
CANISTER SUCTION 1500CC (MISCELLANEOUS) ×2 IMPLANT
CASSETTE VERAFLO VERALINK (MISCELLANEOUS) ×2 IMPLANT
COVER SURGICAL LIGHT HANDLE (MISCELLANEOUS) ×4 IMPLANT
DRAPE INCISE IOBAN 66X45 STRL (DRAPES) ×4 IMPLANT
DRAPE U-SHAPE 47X51 STRL (DRAPES) ×1 IMPLANT
DRESSING VERAFLO CLEANSE CC (GAUZE/BANDAGES/DRESSINGS) IMPLANT
DRSG ADAPTIC 3X8 NADH LF (GAUZE/BANDAGES/DRESSINGS) ×1 IMPLANT
DRSG VERAFLO CLEANSE CC (GAUZE/BANDAGES/DRESSINGS) ×3
DURAPREP 26ML APPLICATOR (WOUND CARE) ×3 IMPLANT
ELECT REM PT RETURN 9FT ADLT (ELECTROSURGICAL) ×3
ELECTRODE REM PT RTRN 9FT ADLT (ELECTROSURGICAL) IMPLANT
GAUZE SPONGE 4X4 12PLY STRL (GAUZE/BANDAGES/DRESSINGS) ×3 IMPLANT
GLOVE BIOGEL PI IND STRL 9 (GLOVE) ×1 IMPLANT
GLOVE BIOGEL PI INDICATOR 9 (GLOVE) ×2
GLOVE SURG ORTHO 9.0 STRL STRW (GLOVE) ×1 IMPLANT
GOWN STRL REUS W/ TWL XL LVL3 (GOWN DISPOSABLE) ×2 IMPLANT
GOWN STRL REUS W/TWL XL LVL3 (GOWN DISPOSABLE) ×15
HANDPIECE INTERPULSE COAX TIP (DISPOSABLE) ×3
IV NS 1000ML (IV SOLUTION) ×3
IV NS 1000ML BAXH (IV SOLUTION) IMPLANT
KIT BASIN OR (CUSTOM PROCEDURE TRAY) ×3 IMPLANT
KIT ROOM TURNOVER OR (KITS) ×3 IMPLANT
MANIFOLD NEPTUNE II (INSTRUMENTS) ×3 IMPLANT
NS IRRIG 1000ML POUR BTL (IV SOLUTION) ×3 IMPLANT
PACK ORTHO EXTREMITY (CUSTOM PROCEDURE TRAY) ×3 IMPLANT
PAD ARMBOARD 7.5X6 YLW CONV (MISCELLANEOUS) ×4 IMPLANT
SET HNDPC FAN SPRY TIP SCT (DISPOSABLE) IMPLANT
SPONGE LAP 18X18 X RAY DECT (DISPOSABLE) ×2 IMPLANT
STOCKINETTE IMPERVIOUS 9X36 MD (GAUZE/BANDAGES/DRESSINGS) ×2 IMPLANT
SUT ETHILON 2 0 FS 18 (SUTURE) ×2 IMPLANT
SUT ETHILON 2 0 PSLX (SUTURE) ×2 IMPLANT
SUT SILK 2 0 REEL (SUTURE) ×2 IMPLANT
SWAB COLLECTION DEVICE MRSA (MISCELLANEOUS) ×1 IMPLANT
SWAB CULTURE ESWAB REG 1ML (MISCELLANEOUS) IMPLANT
TOWEL OR 17X26 10 PK STRL BLUE (TOWEL DISPOSABLE) ×3 IMPLANT
TUBE CONNECTING 12'X1/4 (SUCTIONS)
TUBE CONNECTING 12X1/4 (SUCTIONS) ×1 IMPLANT
YANKAUER SUCT BULB TIP NO VENT (SUCTIONS) ×1 IMPLANT

## 2017-11-28 NOTE — Anesthesia Postprocedure Evaluation (Signed)
Anesthesia Post Note  Patient: Shelby Reese  Procedure(s) Performed: IRRIGATION AND DEBRIDEMENT RIGHT KNEE , APPLY INSTILLATION VAC (Right )     Patient location during evaluation: PACU Anesthesia Type: General Level of consciousness: awake and alert Pain management: pain level controlled Vital Signs Assessment: post-procedure vital signs reviewed and stable Respiratory status: spontaneous breathing, nonlabored ventilation, respiratory function stable and patient connected to nasal cannula oxygen Cardiovascular status: blood pressure returned to baseline and stable Postop Assessment: no apparent nausea or vomiting Anesthetic complications: no    Last Vitals:  Vitals:   11/28/17 1250 11/28/17 1326  BP:  (!) 163/55  Pulse: 88 84  Resp: 14 18  Temp: 36.7 C 36.8 C  SpO2: 97% 99%    Last Pain:  Vitals:   11/28/17 1430  TempSrc:   PainSc: 8                  [name]

## 2017-11-28 NOTE — Anesthesia Preprocedure Evaluation (Addendum)
Anesthesia Evaluation  Patient identified by MRN, date of birth, ID band Patient awake    Reviewed: Allergy & Precautions, NPO status , Patient's Chart, lab work & pertinent test results  Airway Mallampati: II  TM Distance: >3 FB Neck ROM: Full    Dental  (+) Teeth Intact, Dental Advisory Given   Pulmonary former smoker,    breath sounds clear to auscultation       Cardiovascular hypertension,  Rhythm:Regular Rate:Normal     Neuro/Psych    GI/Hepatic   Endo/Other    Renal/GU      Musculoskeletal   Abdominal   Peds  Hematology   Anesthesia Other Findings   Reproductive/Obstetrics                             Anesthesia Physical Anesthesia Plan  ASA: III  Anesthesia Plan: General   Post-op Pain Management:    Induction: Intravenous  PONV Risk Score and Plan: Ondansetron and Dexamethasone  Airway Management Planned: LMA  Additional Equipment:   Intra-op Plan:   Post-operative Plan:   Informed Consent: I have reviewed the patients History and Physical, chart, labs and discussed the procedure including the risks, benefits and alternatives for the proposed anesthesia with the patient or authorized representative who has indicated his/her understanding and acceptance.   Dental advisory given  Plan Discussed with: CRNA and Anesthesiologist  Anesthesia Plan Comments: (Plan GA with LMA)        Anesthesia Quick Evaluation

## 2017-11-28 NOTE — Evaluation (Signed)
Physical Therapy Evaluation Patient Details Name: Shelby Reese MRN: [id number] DOB: Jul 08, 1935 Today's Date: 11/28/2017   History of Present Illness  83yo female who had limb salvage intervention with I&D and wound vac placement, however at home she was non compliant with wound care nurse and recommendations. She received repeat I&D to knee wound with vac placement 11/28/17. PMH anemia, CKD, fibromyalgia, GERD, hernia, HTN, hypothyroidism, lymphedema, OA, PVD, knee ulcer, venous insufficiency, hx multiople I&Ds with vacs   Clinical Impression    Patient received in bed, pleasant and willing to participate with skilled PT services today. She requires Min assist for bed mobility however is significantly limited by deconditioning and functional weakness, only able to achieve partial standing position with MaxAx1 at bedside today. She will likely require physical assist +2 for safety/equipment to progress mobility. Patient will benefit from skilled PT services while hospitalized, then will likely benefit from SNF care moving forward. She was left in bed with all needs met and bed alarm activated this afternoon.      Follow Up Recommendations SNF    Equipment Recommendations  None recommended by PT    Recommendations for Other Services       Precautions / Restrictions Precautions Precautions: Fall Precaution Comments: wound vac  Restrictions Weight Bearing Restrictions: No      Mobility  Bed Mobility Overal bed mobility: Needs Assistance Bed Mobility: Supine to Sit;Sit to Supine     Supine to sit: Min assist Sit to supine: Min guard      Transfers Overall transfer level: Needs assistance Equipment used: Rolling walker (2 wheeled) Transfers: Sit to/from Stand Sit to Stand: Max assist         General transfer comment: only able to achieve partial sit to stand with MaxA x1 today, recc +2 for safety and equipment   Ambulation/Gait             General Gait Details:  DNT, unable to achieve standing/safely perform with +1 assist   Stairs            Wheelchair Mobility    Modified Rankin (Stroke Patients Only)       Balance Overall balance assessment: Needs assistance;History of Falls Sitting-balance support: Bilateral upper extremity supported;Feet supported Sitting balance-Leahy Scale: Good     Standing balance support: Bilateral upper extremity supported;During functional activity Standing balance-Leahy Scale: Poor                               Pertinent Vitals/Pain Pain Assessment: No/denies pain    Home Living Family/patient expects to be discharged to:: Private residence Living Arrangements: Spouse/significant other Available Help at Discharge: Family;Available PRN/intermittently Type of Home: House Home Access: Stairs to enter Entrance Stairs-Rails: None Entrance Stairs-Number of Steps: 2 Home Layout: Two level;Bed/bath upstairs Home Equipment: Walker - standard;Shower seat;Other (comment) Additional Comments: Pt reports husband is not of any help at home and complains when she needs him to help her. Reports he takes naps most of the day so may not be able to assist.     Prior Function Level of Independence: Independent with assistive device(s)         Comments: Retired Therapist, sports.     Hand Dominance   Dominant Hand: Right    Extremity/Trunk Assessment   Upper Extremity Assessment Upper Extremity Assessment: Defer to OT evaluation    Lower Extremity Assessment Lower Extremity Assessment: Generalized weakness    Cervical / Trunk Assessment  Cervical / Trunk Assessment: Kyphotic  Communication   Communication: No difficulties  Cognition Arousal/Alertness: Awake/alert Behavior During Therapy: WFL for tasks assessed/performed Overall Cognitive Status: No family/caregiver present to determine baseline cognitive functioning                                        General Comments       Exercises     Assessment/Plan    PT Assessment Patient needs continued PT services  PT Problem List Decreased strength;Decreased mobility;Decreased safety awareness;Decreased coordination;Decreased activity tolerance;Decreased balance;Decreased knowledge of use of DME       PT Treatment Interventions DME instruction;Therapeutic activities;Gait training;Therapeutic exercise;Patient/family education;Stair training;Balance training;Functional mobility training;Neuromuscular re-education    PT Goals (Current goals can be found in the Care Plan section)  Acute Rehab PT Goals Patient Stated Goal: to get well  PT Goal Formulation: With patient Time For Goal Achievement: 12/12/17 Potential to Achieve Goals: Good    Frequency Min 2X/week   Barriers to discharge        Co-evaluation               AM-PAC PT "6 Clicks" Daily Activity  Outcome Measure Difficulty turning over in bed (including adjusting bedclothes, sheets and blankets)?: Unable Difficulty moving from lying on back to sitting on the side of the bed? : Unable Difficulty sitting down on and standing up from a chair with arms (e.g., wheelchair, bedside commode, etc,.)?: Unable Help needed moving to and from a bed to chair (including a wheelchair)?: A Lot Help needed walking in hospital room?: A Lot Help needed climbing 3-5 steps with a railing? : Total 6 Click Score: 8    End of Session Equipment Utilized During Treatment: Gait belt Activity Tolerance: Patient tolerated treatment well Patient left: in bed;with bed alarm set;with call bell/phone within reach   PT Visit Diagnosis: Unsteadiness on feet (R26.81);Muscle weakness (generalized) (M62.81);Difficulty in walking, not elsewhere classified (R26.2);History of falling (Z91.81)    Time: 0093-8182 PT Time Calculation (min) (ACUTE ONLY): 24 min   Charges:   PT Evaluation $PT Eval Moderate Complexity: 1 Mod PT Treatments $Therapeutic Activity: 8-22 mins   PT  G Codes:   PT G-Codes **NOT FOR INPATIENT CLASS** Functional Assessment Tool Used: AM-PAC 6 Clicks Basic Mobility;Clinical judgement    Deniece Ree PT, DPT, CBIS  Supplemental Physical Therapist [place]   Pager [phone]

## 2017-11-28 NOTE — Op Note (Signed)
11/28/2017  11:49 AM  PATIENT:  Shelby Reese    PRE-OPERATIVE DIAGNOSIS:  Chronic Wound Right Knee  POST-OPERATIVE DIAGNOSIS:  Same  PROCEDURE:  IRRIGATION AND DEBRIDEMENT RIGHT KNEE , APPLY INSTILLATION VAC, local tissue rearrangement for partial wound closure 5 x 10 cm.  SURGEON:  [name], MD  PHYSICIAN ASSISTANT:None ANESTHESIA:   General  PREOPERATIVE INDICATIONS:  Shelby Reese is a  83 y.o. female with a diagnosis of Chronic Wound Right Knee who failed conservative measures and elected for surgical management.    The risks benefits and alternatives were discussed with the patient preoperatively including but not limited to the risks of infection, bleeding, nerve injury, cardiopulmonary complications, the need for revision surgery, among others, and the patient was willing to proceed.  OPERATIVE IMPLANTS: Installation wound VAC  OPERATIVE FINDINGS: Necrotic tissue the extends down to the greater saphenous vein.  OPERATIVE PROCEDURE: Patient was brought the operating room and underwent a general anesthetic.  After adequate levels of anesthesia were obtained patient's right lower extremity was scrubbed with Hibiclens dried prepped using DuraPrep draped into a sterile field a timeout was called.  Patient had a massive wound over the anterior medial aspect inferior to the patella of the right knee.  The wound is 21 x 10 cm.  A Cobb curette was used to remove fibrinous exudate over the anterior aspect of the wound this had good granulation tissue after excision of the fibrinous tissue with a Cobb elevator.  Medially there was a large area of necrotic tissue this was excised the saphenous vein was within this necrotic tissue and the saphenous vein was ligated inferiorly and superiorly with 2-0 silk.  The remainder of the medial wound was debrided back to healthy viable adipose tissue.  Local tissue rearrangement was used for partial closure of the medial aspect of the wound which was 5  x 10 cm.  This was partially closed with 2-0 nylon.  A 10 blade knife was used for further debridement of the skin and soft tissue.  A installation wound VAC was applied with the reticulated foam followed by these solid foam the wound edges were prepped with benzoin a dressing was applied this had a good suction fit installation was set for 20cc patient was extubated taken the PACU in stable condition.   DISCHARGE PLANNING:  Antibiotic duration:24 hours  Weightbearing: WBAT  Pain medication: Oral and IV  Dressing care/ Wound VAC: Installation wound VAC until Wednesday then returned to the operating room with repeat installation wound VAC and skin graft next Friday.  Ambulatory devices: Walker.  Discharge to: Home in approximately 1 week.  Follow-up: In the office 1 week post operative.

## 2017-11-28 NOTE — H&P (Signed)
Shelby Reese is an 83 y.o. female.   Chief Complaint: Persistent ulceration medial aspect right knee. HPI: Patient is an 83 year old woman who underwent limb salvage intervention with debridement wound VAC therapy and skin graft therapy.  Postoperatively at home patient would remove the dressing over the knee with if not allow wound care nursing to apply compression dressing to the wound and would sit with her leg dependent because of increased swelling and increasing breakdown of the wound.  Patient had a preoperative hemoglobin of 8.1 her current hemoglobin is 8.3.  Attempts to have her anemia worked up have been unsuccessful and patient has not allowed workup for the anemia.  Past Medical History:  Diagnosis Date  . Allergy   . Anemia   . CKD (chronic kidney disease), stage II   . Fibromyalgia   . GERD (gastroesophageal reflux disease)   . Hiatal hernia   . Hypertension   . Hypothyroid   . Lymphedema    venous insufficency  . Osteoarthritis   . Peripheral vascular disease ([place])   . Ulcer of knee ([place])    right  . Urinary incontinence   . Varicose veins   . Venous insufficiency     Past Surgical History:  Procedure Laterality Date  . APPLICATION OF WOUND VAC Right 07/18/2017   Procedure: APPLICATION OF WOUND VAC;  Surgeon: [name], MD;  Location: [facility];  Service: Orthopedics;  Laterality: Right;  . CATARACT EXTRACTION W/ INTRAOCULAR LENS  IMPLANT, BILATERAL    . COLONOSCOPY    . I&D EXTREMITY Right 07/18/2017   Procedure: IRRIGATION AND DEBRIDEMENT RIGHT KNEE;  Surgeon: [name], MD;  Location: [facility];  Service: Orthopedics;  Laterality: Right;  . I&D EXTREMITY Right 07/23/2017   Procedure: REPEAT IRRIGATION AND DEBRIDEMENT RIGHT KNEE;  Surgeon: [name], MD;  Location: [facility];  Service: Orthopedics;  Laterality: Right;  . KNEE ARTHROSCOPY Left    menisectomy  . MULTIPLE TOOTH EXTRACTIONS    . SKIN SPLIT GRAFT Right 07/25/2017   Procedure: Repeat Irrigation and  Debridement Right Knee, Split Thickness Skin Graft;  Surgeon: [name], MD;  Location: [facility];  Service: Orthopedics;  Laterality: Right;  . TONSILLECTOMY      Family History  Problem Relation Age of Onset  . Stroke Mother   . Varicose Veins Mother   . Cancer Father        prostate  . Stroke Sister   . Heart disease Sister   . Varicose Veins Sister   . Stroke Maternal Grandmother   . Varicose Veins Sister    Social History:  reports that she quit smoking about 38 years ago. She has a 5.00 pack-year smoking history. she has never used smokeless tobacco. She reports that she drinks about 8.4 oz of alcohol per week. She reports that she does not use drugs.  Allergies:  Allergies  Allergen Reactions  . Latex Other (See Comments)    Rash/inflammation due to exposure  . Bactrim [Sulfamethoxazole-Trimethoprim] Diarrhea and Nausea Only  . Ciprofloxacin Other (See Comments)    tremors  . Diovan [Valsartan] Other (See Comments)    Extreme vertigo  . Food     Orange Juice-upset stomach/diarrhea  . Nitrofuran Derivatives Hives    "Full body rash"  . Penicillins Hives and Swelling    *tolerated Ceftriaxone September 2018 Has patient had a PCN reaction causing immediate rash, facial/tongue/throat swelling, SOB or lightheadedness with hypotension:No--severe irritation at the injection site Has patient  had a PCN reaction causing severe rash involving mucus membranes or skin necrosis:Unknown Has patient had a PCN reaction that required hospitalization:No Has patient had a PCN reaction occurring within the last 10 years:Yes If all of the above answers are "NO", then may proceed with  . Sulfa Antibiotics Diarrhea and Nausea Only  . Other Rash    Mycins    Medications Prior to Admission  Medication Sig Dispense Refill  . acetaminophen (TYLENOL) 500 MG tablet Take 1,000 mg by mouth daily.    Marland Kitchen aspirin EC 81 MG tablet Take 81 mg by mouth daily with breakfast.    . benazepril (LOTENSIN)  40 MG tablet Take 40 mg by mouth daily with breakfast.     . Calcium Carbonate-Vitamin D (CALCIUM 600+D) 600-400 MG-UNIT per tablet Take 1 tablet by mouth daily at 3 pm.     . cetirizine (ZYRTEC) 10 MG tablet Take 10 mg by mouth at bedtime.     Marland Kitchen diltiazem (TIAZAC) 120 MG 24 hr capsule Take 120 mg by mouth daily with breakfast.     . diphenhydrAMINE (BENADRYL) 25 mg capsule Take 50 mg by mouth every 6 (six) hours.     Marland Kitchen doxycycline (VIBRA-TABS) 100 MG tablet Take 1 tablet (100 mg total) 2 (two) times daily by mouth. 60 tablet 0  . famotidine (PEPCID) 40 MG tablet Take 40 mg by mouth daily at 3 pm. 1600    . ferrous sulfate 325 (65 FE) MG tablet Take 1 tablet (325 mg total) by mouth 2 (two) times daily with a meal. (Patient taking differently: Take 325 mg by mouth daily with breakfast. ) 60 tablet 3  . Gauze Pads & Dressings (KERLIX GAUZE ROLL LARGE) MISC 1 Units by Does not apply route 4 (four) times daily. 30 each 1  . Gauze Pads & Dressings 4"X4-1/2" PADS 2 Units by Does not apply route 4 (four) times daily. 60 each 2  . hydrALAZINE (APRESOLINE) 25 MG tablet Take 1 tablet (25 mg total) by mouth every 8 (eight) hours. (Patient not taking: Reported on 11/12/2017) 90 tablet 0  . HYDROcodone-acetaminophen (NORCO/VICODIN) 5-325 MG tablet Take 1 tablet by mouth every 6 (six) hours as needed for moderate pain. (Patient not taking: Reported on 11/12/2017) 30 tablet 0  . hydrocortisone cream 0.5 % Apply topically 3 (three) times daily. (Patient not taking: Reported on 11/12/2017) 30 g 3  . ibuprofen (ADVIL,MOTRIN) 200 MG tablet Take 400 mg by mouth daily.    . Magnesium Oxide (MAG-200 PO) Take 200 mg by mouth daily at 3 pm.    . Multiple Vitamin (MULTIVITAMIN WITH MINERALS) TABS tablet Take 1 tablet by mouth daily.    Marland Kitchen NATURAL PSYLLIUM FIBER PO Take 1 capsule by mouth daily.    Marland Kitchen oxyCODONE-acetaminophen (PERCOCET/ROXICET) 5-325 MG tablet Take 1 tablet by mouth every 6 (six) hours as needed for severe  pain. 30 tablet 0  . predniSONE (DELTASONE) 20 MG tablet Take 2 tablets (40 mg total) by mouth daily with breakfast. 40 mg on 10/17, 20 mg on 10/18, 10 mg on 10/19 and then 5mg  on 10/20 (Patient not taking: Reported on 11/12/2017) 8 tablet 0  . silver sulfADIAZINE (SILVADENE) 1 % cream Apply 1 application topically daily. (Patient not taking: Reported on 11/12/2017) 50 g 2  . SYNTHROID 175 MCG tablet Take 175 mcg by mouth daily before breakfast.     . vitamin C (ASCORBIC ACID) 500 MG tablet Take 500 mg by mouth daily.  No results found for this or any previous visit (from the past 48 hour(s)). No results found.  Review of Systems  All other systems reviewed and are negative.   There were no vitals taken for this visit. Physical Exam  Examination patient is alert oriented no adenopathy well-dressed normal affect normal respiratory effort she has a large necrotic wound in the medial aspect of the right knee.  There is no ascending cellulitis she has massive venous and lymphatic swelling to the left lower extremity there is no active bleeding.    Assessment/Plan Assessment: Large wound medial aspect right knee failed current surgical management.  Plan: We will plan for repeat irrigation debridement application of an installation wound VAC.  Will remain in the hospital until we have good granulation tissue that applies skin grafting and a incisional wound VAC.  Risks and benefits were discussed including infection neurovascular injury need for additional surgery.  [name], MD 11/28/2017, 9:23 AM

## 2017-11-28 NOTE — Anesthesia Procedure Notes (Signed)
Procedure Name: LMA Insertion Date/Time: 11/28/2017 10:54 AM Performed by: Shirlyn Goltz, CRNA Pre-anesthesia Checklist: Patient identified, Emergency Drugs available, Suction available and Patient being monitored Patient Re-evaluated:Patient Re-evaluated prior to induction Oxygen Delivery Method: Circle system utilized Preoxygenation: Pre-oxygenation with 100% oxygen Induction Type: IV induction Ventilation: Mask ventilation without difficulty LMA: LMA inserted LMA Size: 4.0 Number of attempts: 1 (by dr Linna Caprice) Placement Confirmation: positive ETCO2 and breath sounds checked- equal and bilateral Tube secured with: Tape Dental Injury: Teeth and Oropharynx as per pre-operative assessment

## 2017-11-28 NOTE — Transfer of Care (Signed)
Immediate Anesthesia Transfer of Care Note  Patient: Shelby Reese  Procedure(s) Performed: IRRIGATION AND DEBRIDEMENT RIGHT KNEE , APPLY INSTILLATION VAC (Right )  Patient Location: PACU  Anesthesia Type:General  Level of Consciousness: awake, patient cooperative and responds to stimulation  Airway & Oxygen Therapy: Patient Spontanous Breathing and Patient connected to face mask oxygen  Post-op Assessment: Report given to RN, Post -op Vital signs reviewed and stable and Patient moving all extremities X 4  Post vital signs: Reviewed and stable  Last Vitals:  Vitals:   11/28/17 0924  BP: (!) 191/54  Pulse: (!) 107  Resp: 20  Temp: 36.8 C    Last Pain:  Vitals:   11/28/17 0941  TempSrc:   PainSc: 10-Worst pain ever         Complications: No apparent anesthesia complications

## 2017-11-29 ENCOUNTER — Encounter (HOSPITAL_COMMUNITY): Payer: Self-pay | Admitting: Orthopedic Surgery

## 2017-11-29 LAB — BASIC METABOLIC PANEL
Anion gap: 8 (ref 5–15)
BUN: 29 mg/dL — ABNORMAL HIGH (ref 6–20)
CALCIUM: 8.8 mg/dL — AB (ref 8.9–10.3)
CO2: 22 mmol/L (ref 22–32)
Chloride: 101 mmol/L (ref 101–111)
Creatinine, Ser: 1.36 mg/dL — ABNORMAL HIGH (ref 0.44–1.00)
GFR calc Af Amer: 41 mL/min — ABNORMAL LOW (ref >60)
GFR, EST NON AFRICAN AMERICAN: 36 mL/min — AB (ref >60)
GLUCOSE: 155 mg/dL — AB (ref 65–99)
Potassium: 5 mmol/L (ref 3.5–5.1)
Sodium: 131 mmol/L — ABNORMAL LOW (ref 135–145)

## 2017-11-29 NOTE — Progress Notes (Signed)
Subjective: 1 Day Post-Op Procedure(s) (LRB): IRRIGATION AND DEBRIDEMENT RIGHT KNEE , APPLY INSTILLATION VAC (Right) Patient reports pain as mild.    Objective: Vital signs in last 24 hours: Temp:  [97.6 F (36.4 C)-98.7 F (37.1 C)] 98.4 F (36.9 C) (01/12 1100) Pulse Rate:  [75-103] 103 (01/12 1100) Resp:  [9-18] 18 (01/12 0534) BP: (107-163)/(42-72) 124/50 (01/12 0534) SpO2:  [91 %-99 %] 91 % (01/12 1100)  Intake/Output from previous day: 01/11 0701 - 01/12 0700 In: 1131.8 [P.O.:240; I.V.:841.8; IV Piggyback:50] Out: 960 [Urine:800; Drains:150; Blood:10] Intake/Output this shift: No intake/output data recorded.  Recent Labs    11/28/17 0955  HGB 8.0*   Recent Labs    11/28/17 0955  WBC 12.8*  RBC 3.38*  HCT 27.5*  PLT 518*   Recent Labs    11/28/17 0955 11/29/17 0513  NA 133* 131*  K 4.8 5.0  CL 102 101  CO2 19* 22  BUN 29* 29*  CREATININE 1.67* 1.36*  GLUCOSE 110* 155*  CALCIUM 9.6 8.8*   No results for input(s): LABPT, INR in the last 72 hours.  Neurologically intact Compartment soft  Assessment/Plan: 1 Day Post-Op Procedure(s) (LRB): IRRIGATION AND DEBRIDEMENT RIGHT KNEE , APPLY INSTILLATION VAC (Right) Continue vac with return to OR next week for further I&D, eventual skin graft-no related problems  Garald Balding 11/29/2017, 12:23 PM

## 2017-11-29 NOTE — Plan of Care (Signed)
  Progressing Education: Knowledge of General Education information will improve 11/29/2017 1205 - Progressing by Rance Muir, RN Health Behavior/Discharge Planning: Ability to manage health-related needs will improve 11/29/2017 1205 - Progressing by Rance Muir, RN Clinical Measurements: Ability to maintain clinical measurements within normal limits will improve 11/29/2017 1205 - Progressing by Rance Muir, RN Will remain free from infection 11/29/2017 1205 - Progressing by Rance Muir, RN Diagnostic test results will improve 11/29/2017 1205 - Progressing by Rance Muir, RN Respiratory complications will improve 11/29/2017 1205 - Progressing by Rance Muir, RN Cardiovascular complication will be avoided 11/29/2017 1205 - Progressing by Rance Muir, RN Activity: Risk for activity intolerance will decrease 11/29/2017 1205 - Progressing by Rance Muir, RN Nutrition: Adequate nutrition will be maintained 11/29/2017 1205 - Progressing by Rance Muir, RN Coping: Level of anxiety will decrease 11/29/2017 1205 - Progressing by Rance Muir, RN Elimination: Will not experience complications related to bowel motility 11/29/2017 1205 - Progressing by Rance Muir, RN Will not experience complications related to urinary retention 11/29/2017 1205 - Progressing by Rance Muir, RN Pain Managment: General experience of comfort will improve 11/29/2017 1205 - Progressing by Rance Muir, RN Safety: Ability to remain free from injury will improve 11/29/2017 1205 - Progressing by Rance Muir, RN Skin Integrity: Risk for impaired skin integrity will decrease 11/29/2017 1205 - Progressing by Rance Muir, RN

## 2017-11-29 NOTE — NC FL2 (Signed)
[place] LEVEL OF CARE SCREENING TOOL     IDENTIFICATION  Patient Name: Shelby Reese Birthdate: 1935/06/10 Sex: female Admission Date (Current Location): 11/28/2017  [facility] and [place] Number:  Herbalist and Address:  [facility], [place] [address] [place], [place] [address]      Provider Number: [id number]  Attending Physician Name and Address:  [name], MD  Relative Name and Phone Number:       Current Level of Care: Hospital Recommended Level of Care: [place] Prior Approval Number:    Date Approved/Denied:   PASRR Number: [id number]  Discharge Plan: SNF    Current Diagnoses: Patient Active Problem List   Diagnosis Date Noted  . Open wound of right knee 11/28/2017  . Allergic drug rash   . Sepsis ([place]) 08/29/2017  . CKD (chronic kidney disease), stage III ([place]) 08/29/2017  . Hypothyroid 08/29/2017  . Hypertension 08/29/2017  . Peripheral vascular disease ([place]) 08/29/2017  . Skin ulcer of knee, right, with fat layer exposed ([place]) 08/29/2017  . Acute kidney injury ([place]) 08/29/2017  . Metabolic acidosis 42/68/3419  . Acute hyponatremia 08/29/2017  . Pressure injury of skin 07/25/2017  . Wound, open, knee, lower leg, or ankle with complication, right, initial encounter   . Idiopathic chronic venous hypertension of both lower extremities with ulcer and inflammation ([place]) 07/15/2017  . Skin ulcer of right knee with necrosis of muscle ([place]) 07/15/2017  . Atherosclerosis of artery of right lower extremity ([place]) 07/11/2017  . Essential hypertension 07/11/2017  . Osteoporosis 07/11/2017  . Cataracts, bilateral 07/11/2017  . Hypothyroidism, acquired 12/27/2014  . Anemia, iron deficiency 12/27/2014  . Hiatal hernia 12/27/2014  . Varicose veins of lower extremities with other complications 62/22/9798    Orientation RESPIRATION BLADDER Height & Weight     Self, Time, Situation, Place  Normal  Continent Weight:   Height:  5\' 3"  (160 cm)  BEHAVIORAL SYMPTOMS/MOOD NEUROLOGICAL BOWEL NUTRITION STATUS      Continent (Please see d/c summary)  AMBULATORY STATUS COMMUNICATION OF NEEDS Skin   Extensive Assist Verbally Wound Vac, Surgical wounds(Right leg,125 mmHg)                       Personal Care Assistance Level of Assistance  Bathing, Feeding, Dressing Bathing Assistance: Maximum assistance Feeding assistance: Independent Dressing Assistance: Maximum assistance     Functional Limitations Info  Sight, Hearing, Speech Sight Info: Adequate Hearing Info: Adequate Speech Info: Adequate    SPECIAL CARE FACTORS FREQUENCY  PT (By licensed PT), OT (By licensed OT)     PT Frequency: 2x OT Frequency: 2x            Contractures Contractures Info: Not present    Additional Factors Info  Code Status, Allergies Code Status Info: Full Code Allergies Info: Latex, Bactrim Sulfamethoxazole-trimethoprim, Ciprofloxacin, Diovan Valsartan, Food, Nitrofuran Derivatives, Penicillins, Sulfa Antibiotics, Other           Current Medications (11/29/2017):  This is the current hospital active medication list Current Facility-Administered Medications  Medication Dose Route Frequency Provider Last Rate Last Dose  . 0.9 %  sodium chloride infusion   Intravenous Continuous [name], MD 10 mL/hr at 11/28/17 1349    . acetaminophen (TYLENOL) tablet 650 mg  650 mg Oral Q4H PRN [name], MD       Or  . acetaminophen (TYLENOL) suppository 650 mg  650 mg Rectal Q4H  PRN [name], MD      . aspirin EC tablet 325 mg  325 mg Oral Daily [name], MD   325 mg at 11/29/17 7124  . benazepril (LOTENSIN) tablet 40 mg  40 mg Oral Q breakfast [name], MD   40 mg at 11/29/17 0917  . bisacodyl (DULCOLAX) suppository 10 mg  10 mg Rectal Daily PRN [name], MD      . diltiazem [facility]) 24 hr capsule 120 mg  120 mg Oral Q breakfast [name], MD   120 mg at 11/29/17  0919  . docusate sodium (COLACE) capsule 100 mg  100 mg Oral BID [name], MD   100 mg at 11/29/17 0919  . famotidine (PEPCID) tablet 40 mg  40 mg Oral Q1500 [name], MD      . ferrous sulfate tablet 325 mg  325 mg Oral Q breakfast [name], MD   325 mg at 11/29/17 0919  . hydrALAZINE (APRESOLINE) tablet 25 mg  25 mg Oral Q8H [name], MD   25 mg at 11/29/17 0654  . HYDROmorphone (DILAUDID) injection 1 mg  1 mg Intravenous Q2H PRN [name], MD   1 mg at 11/29/17 1215  . Influenza vac split quadrivalent PF (FLUZONE HIGH-DOSE) injection 0.5 mL  0.5 mL Intramuscular Tomorrow-1000 [name], MD      . levothyroxine (SYNTHROID, LEVOTHROID) tablet 175 mcg  175 mcg Oral QAC breakfast [name], MD   175 mcg at 11/29/17 0919  . loratadine (CLARITIN) tablet 10 mg  10 mg Oral Daily [name], MD   10 mg at 11/29/17 0919  . magnesium citrate solution 1 Bottle  1 Bottle Oral Once PRN [name], MD      . methocarbamol (ROBAXIN) tablet 500 mg  500 mg Oral Q6H PRN [name], MD   500 mg at 11/29/17 1003   Or  . methocarbamol (ROBAXIN) 500 mg in dextrose 5 % 50 mL IVPB  500 mg Intravenous Q6H PRN [name], MD      . metoCLOPramide (REGLAN) tablet 5-10 mg  5-10 mg Oral Q8H PRN [name], MD       Or  . metoCLOPramide (REGLAN) injection 5-10 mg  5-10 mg Intravenous Q8H PRN [name], MD      . ondansetron [facility]) tablet 4 mg  4 mg Oral Q6H PRN [name], MD       Or  . ondansetron [facility]) injection 4 mg  4 mg Intravenous Q6H PRN [name], MD      . oxyCODONE (Oxy IR/ROXICODONE) immediate release tablet 10 mg  10 mg Oral Q3H PRN [name], MD   10 mg at 11/29/17 0918  . oxyCODONE (Oxy IR/ROXICODONE) immediate release tablet 5 mg  5 mg Oral Q3H PRN [name], MD      . polyethylene glycol (MIRALAX / GLYCOLAX) packet 17 g  17 g Oral Daily PRN [name], MD         Discharge Medications: Please see discharge  summary for a list of discharge medications.  Relevant Imaging Results:  Relevant Lab Results:   Additional Information SSN: [id number]  Eileen Stanford, LCSW

## 2017-11-30 NOTE — Progress Notes (Signed)
Subjective: 2 Days Post-Op Procedure(s) (LRB): IRRIGATION AND DEBRIDEMENT RIGHT KNEE , APPLY INSTILLATION VAC (Right) Patient reports pain as mild.    Objective: Vital signs in last 24 hours: Temp:  [98.2 F (36.8 C)-98.7 F (37.1 C)] 98.7 F (37.1 C) (01/13 0453) Pulse Rate:  [92-99] 92 (01/13 0453) Resp:  [16-18] 16 (01/13 0453) BP: (128-156)/(48-56) 156/51 (01/13 0453) SpO2:  [95 %-99 %] 95 % (01/13 0453)  Intake/Output from previous day: 01/12 0701 - 01/13 0700 In: 500 [P.O.:500] Out: -  Intake/Output this shift: No intake/output data recorded.  Recent Labs    11/28/17 0955  HGB 8.0*   Recent Labs    11/28/17 0955  WBC 12.8*  RBC 3.38*  HCT 27.5*  PLT 518*   Recent Labs    11/28/17 0955 11/29/17 0513  NA 133* 131*  K 4.8 5.0  CL 102 101  CO2 19* 22  BUN 29* 29*  CREATININE 1.67* 1.36*  GLUCOSE 110* 155*  CALCIUM 9.6 8.8*   No results for input(s): LABPT, INR in the last 72 hours.  No cellulitis present Compartment soft Vac in place-functioning Assessment/Plan: 2 Days Post-Op Procedure(s) (LRB): IRRIGATION AND DEBRIDEMENT RIGHT KNEE , APPLY INSTILLATION VAC (Right) Discharge to SNF  Shelby Reese 11/30/2017, 11:13 AM

## 2017-11-30 NOTE — Care Management Note (Signed)
Case Management Note  Patient Details  Name: Shelby Reese MRN: [id number] Date of Birth: 1935-10-10  Subjective/Objective:     Pt presented for debridement of right knee wound.  Pt from home with home health where she was uncooperative with wound care.  Pt has been confused, paranoid, and delusional during this admission per [facility] RN and per CSW notes.  During our conversation, pt was unable to state where we are or month, year, or day.  Pt tearful, saying she knows she is not thinking clearly and that she needs to go to a SNF because she and her husband are unable to manage at home.  BS RN also agrees that pt needs higher level of care than she thinks husband can manage at home.     Action/Plan: Husband, Legrand Como, contacted at [phone].  He states that this confusion is worse than usual.  He states he can physically care for her at home if there is no other choice, but he feels that [facility] care has not been successful and he thinks it would be best to go to a facility.  He states he does not have any preference of a facility at this point and will be available to speak to [place] by phone or will be back at hospital tomorrow. Arbie Cookey, CSW, contacted to continue pursuing SNF placement.        Expected Discharge Date:                  Expected Discharge Plan:  Skilled Nursing Facility  In-House Referral:  Clinical Social Work  Discharge planning Services  CM Consult  Post Acute Care Choice:    Choice offered to:     DME Arranged:    DME Agency:     HH Arranged:    [place] Agency:     Status of Service:  In process, will continue to follow  If discussed at Long Length of Stay Meetings, dates discussed:    Additional Comments:  Arley Phenix, RN 11/30/2017, 2:13 PM

## 2017-11-30 NOTE — Progress Notes (Signed)
CSW met with patient to discuss plan for discharge. Patient extremely obnoxious upon CSW entering room stating "who are you? What do you want." CSW informed her of duties and roles for discharge planning, patient stated she was not going to a SNF that she was going home with her husband. Patient complained of lack of attention, poor care from nursing staff, etc. Patient also stated she had been sitting on a bedpan for three hours and that she was going to "sue the a** off the hospital as soon as she can call her lawyer." Patient stated that she couldn't call her husband due to not having access to a phone, patient's bedside phone in her hand at the time and CSW asked if she wanted assistance. Patient allowed CSW to call her husband, husband answered and patient began crying and hollering, CSW exited the room.  CSW provided RN with information obtained from patient. RN stated patient's behaviors are consistent with how she behaved yesterday. No documented mental illness so CSW unsure of contributing factors for absurd behavior.  CSW signing off since patient stated she refused to go to SNF.  Shelby Reese, MSW, LCSW-A Weekend Clinical Social Worker [phone]

## 2017-11-30 NOTE — Plan of Care (Signed)
  Not Progressing Education: Knowledge of General Education information will improve 11/30/2017 1123 - Not Progressing by Rance Muir, RN Health Behavior/Discharge Planning: Ability to manage health-related needs will improve 11/30/2017 1123 - Not Progressing by Rance Muir, RN Clinical Measurements: Ability to maintain clinical measurements within normal limits will improve 11/30/2017 1123 - Not Progressing by Rance Muir, RN Will remain free from infection 11/30/2017 1123 - Not Progressing by Rance Muir, RN Diagnostic test results will improve 11/30/2017 1123 - Not Progressing by Rance Muir, RN Respiratory complications will improve 11/30/2017 1123 - Not Progressing by Rance Muir, RN Cardiovascular complication will be avoided 11/30/2017 1123 - Not Progressing by Rance Muir, RN Activity: Risk for activity intolerance will decrease 11/30/2017 1123 - Not Progressing by Rance Muir, RN Nutrition: Adequate nutrition will be maintained 11/30/2017 1123 - Not Progressing by Rance Muir, RN Coping: Level of anxiety will decrease 11/30/2017 1123 - Not Progressing by Rance Muir, RN Elimination: Will not experience complications related to bowel motility 11/30/2017 1123 - Not Progressing by Rance Muir, RN Will not experience complications related to urinary retention 11/30/2017 1123 - Not Progressing by Rance Muir, RN Pain Managment: General experience of comfort will improve 11/30/2017 1123 - Not Progressing by Rance Muir, RN Safety: Ability to remain free from injury will improve 11/30/2017 1123 - Not Progressing by Rance Muir, RN Skin Integrity: Risk for impaired skin integrity will decrease 11/30/2017 1123 - Not Progressing by Rance Muir, RN

## 2017-12-01 NOTE — Plan of Care (Signed)
  Progressing Education: Knowledge of General Education information will improve 12/01/2017 1130 - Progressing by Harlin Heys, RN Health Behavior/Discharge Planning: Ability to manage health-related needs will improve 12/01/2017 1130 - Progressing by Harlin Heys, RN Clinical Measurements: Ability to maintain clinical measurements within normal limits will improve 12/01/2017 1130 - Progressing by Harlin Heys, RN Will remain free from infection 12/01/2017 1130 - Progressing by Harlin Heys, RN Diagnostic test results will improve 12/01/2017 1130 - Progressing by Harlin Heys, RN Respiratory complications will improve 12/01/2017 1130 - Progressing by Harlin Heys, RN Cardiovascular complication will be avoided 12/01/2017 1130 - Progressing by Harlin Heys, RN Activity: Risk for activity intolerance will decrease 12/01/2017 1130 - Progressing by Harlin Heys, RN Nutrition: Adequate nutrition will be maintained 12/01/2017 1130 - Progressing by Harlin Heys, RN Coping: Level of anxiety will decrease 12/01/2017 1130 - Progressing by Harlin Heys, RN Elimination: Will not experience complications related to bowel motility 12/01/2017 1130 - Progressing by Harlin Heys, RN Will not experience complications related to urinary retention 12/01/2017 1130 - Progressing by Harlin Heys, RN Pain Managment: General experience of comfort will improve 12/01/2017 1130 - Progressing by Harlin Heys, RN Safety: Ability to remain free from injury will improve 12/01/2017 1130 - Progressing by Harlin Heys, RN Skin Integrity: Risk for impaired skin integrity will decrease 12/01/2017 1130 - Progressing by Harlin Heys, RN

## 2017-12-01 NOTE — Progress Notes (Signed)
Patient ID: Shelby Reese, female   DOB: 1935-08-05, 83 y.o.   MRN: [id number] Patient is status post repeat irrigation debridement for necrotic ulcer right knee.  Patient's heels are dependent on the bed and we will get some pillows under her calf to float the heels off the bed.  The wound VAC is functioning well.  Patient complains of pain she states she does not want to go to the bathroom.  A bedpan was requested.  Plan for return to the operating room on Wednesday and Friday for wound care.

## 2017-12-02 ENCOUNTER — Encounter (HOSPITAL_COMMUNITY): Payer: Self-pay | Admitting: *Deleted

## 2017-12-02 ENCOUNTER — Other Ambulatory Visit (INDEPENDENT_AMBULATORY_CARE_PROVIDER_SITE_OTHER): Payer: Self-pay | Admitting: Orthopedic Surgery

## 2017-12-02 DIAGNOSIS — S81001S Unspecified open wound, right knee, sequela: Secondary | ICD-10-CM

## 2017-12-02 MED ORDER — CLINDAMYCIN PHOSPHATE 900 MG/50ML IV SOLN
900.0000 mg | INTRAVENOUS | Status: AC
  Administered 2017-12-03: 900 mg via INTRAVENOUS
  Filled 2017-12-02: qty 50

## 2017-12-02 MED ORDER — CHLORHEXIDINE GLUCONATE 4 % EX LIQD
60.0000 mL | Freq: Once | CUTANEOUS | Status: AC
  Administered 2017-12-03: 4 via TOPICAL

## 2017-12-02 NOTE — H&P (View-Only) (Signed)
Patient ID: Shelby Reese, female   DOB: [birth year], 83 y.o.   MRN: [id number] Patient is alert oriented and pleasant this morning.  The blisters from the venous stasis swelling in her leg have popped.  There is wrinkling of the skin patient swelling is decreasing in both lower extremities.  The wound VAC is functioning well.  With return to the operating room tomorrow Wednesday and Friday for further wound debridement.

## 2017-12-02 NOTE — Progress Notes (Signed)
Physical Therapy Treatment Patient Details Name: Shelby Reese MRN: [id number] DOB: June 07, 1935 Today's Date: 12/02/2017    History of Present Illness 83yo female who had limb salvage intervention with I&D and wound vac placement, however at home she was non compliant with wound care nurse and recommendations. She received repeat I&D to knee wound with vac placement 11/28/17. PMH anemia, CKD, fibromyalgia, GERD, hernia, HTN, hypothyroidism, lymphedema, OA, PVD, knee ulcer, venous insufficiency, hx multiople I&Ds with vacs     PT Comments    Patient received in bed, pleasant and willing to participate in PT today; nursing staff reports they have been able to get patient to walk in room and transferred to bedside commode thus trialed treatment session with +1 assist today. Patient continues to require Min assistance for bed mobility, attempted sit to stand with RW at EOB however patient unable today despite Mod-Max assist, PT noted very little effort from patient during transfer. Then trialed functional transfer inside of Stedy lift, patient able to perform with Min assist however soiled herself in standing, and was assisted back to bed for bathing/cleaning by nurse tech. Suspect that discrepancies in mobility may possibly be due to cognitive factors at this point.  Patient left in bed with bed alarm activated, all needs met this afternoon.    Follow Up Recommendations  SNF     Equipment Recommendations  None recommended by PT    Recommendations for Other Services       Precautions / Restrictions Precautions Precautions: Fall Precaution Comments: wound vac  Restrictions Weight Bearing Restrictions: Yes RLE Weight Bearing: Weight bearing as tolerated LLE Weight Bearing: Weight bearing as tolerated    Mobility  Bed Mobility Overal bed mobility: Needs Assistance Bed Mobility: Supine to Sit;Sit to Supine     Supine to sit: Min assist Sit to supine: Min assist;+2 for physical  assistance   General bed mobility comments: VC for safety and encouragement, MinA partially due to line management   Transfers Overall transfer level: Needs assistance Equipment used: Rolling walker (2 wheeled) Transfers: Sit to/from Stand Sit to Stand: Max assist;Min assist         General transfer comment: unable to achieve standing with RW at EOB today with Mod-MaxA x1 today, noted very little physical effort from patient to attempt to stand during this transfer; then trialed sit to stand with Stedy lift, patient able to perform sit to stand with Mni assist but then soiled herself requiring return to bed for cleaning/bathing by nurse techs   Ambulation/Gait             General Gait Details: DNT    Stairs            Wheelchair Mobility    Modified Rankin (Stroke Patients Only)       Balance Overall balance assessment: Needs assistance;History of Falls Sitting-balance support: Bilateral upper extremity supported;Feet supported Sitting balance-Leahy Scale: Good     Standing balance support: Bilateral upper extremity supported;During functional activity Standing balance-Leahy Scale: Fair                              Cognition Arousal/Alertness: Awake/alert Behavior During Therapy: WFL for tasks assessed/performed Overall Cognitive Status: No family/caregiver present to determine baseline cognitive functioning  Exercises      General Comments        Pertinent Vitals/Pain Pain Assessment: Faces Faces Pain Scale: Hurts even more Pain Location: when posterior gastroc is touched R LE  Pain Descriptors / Indicators: Sharp;Sore Pain Intervention(s): Limited activity within patient's tolerance;Monitored during session;Repositioned    Home Living                      Prior Function            PT Goals (current goals can now be found in the care plan section) Acute Rehab PT  Goals Patient Stated Goal: to get well  PT Goal Formulation: With patient Time For Goal Achievement: 12/12/17 Potential to Achieve Goals: Good Progress towards PT goals: Progressing toward goals    Frequency    Min 2X/week      PT Plan Current plan remains appropriate    Co-evaluation              AM-PAC PT "6 Clicks" Daily Activity  Outcome Measure  Difficulty turning over in bed (including adjusting bedclothes, sheets and blankets)?: Unable Difficulty moving from lying on back to sitting on the side of the bed? : Unable Difficulty sitting down on and standing up from a chair with arms (e.g., wheelchair, bedside commode, etc,.)?: Unable Help needed moving to and from a bed to chair (including a wheelchair)?: A Lot Help needed walking in hospital room?: A Lot Help needed climbing 3-5 steps with a railing? : Total 6 Click Score: 8    End of Session Equipment Utilized During Treatment: Gait belt Activity Tolerance: Patient tolerated treatment well Patient left: in bed;with bed alarm set;with call bell/phone within reach Nurse Communication: Mobility status(CNA educated on difficulties with mobility later in the day, PT suspicion cognitive aspects are playing role in discrepancies in mobility, patient soiled ) PT Visit Diagnosis: Unsteadiness on feet (R26.81);Muscle weakness (generalized) (M62.81);Difficulty in walking, not elsewhere classified (R26.2);History of falling (Z91.81)     Time: 1515-1600 PT Time Calculation (min) (ACUTE ONLY): 45 min  Charges:  $Therapeutic Activity: 38-52 mins                    G Codes:       Deniece Ree PT, DPT, CBIS  Supplemental Physical Therapist [place]   Pager [phone]

## 2017-12-02 NOTE — Progress Notes (Signed)
Patient ID: Shelby Reese, female   DOB: [birth year], 83 y.o.   MRN: [id number] Patient is alert oriented and pleasant this morning.  The blisters from the venous stasis swelling in her leg have popped.  There is wrinkling of the skin patient swelling is decreasing in both lower extremities.  The wound VAC is functioning well.  With return to the operating room tomorrow Wednesday and Friday for further wound debridement.

## 2017-12-02 NOTE — Care Management Important Message (Signed)
Important Message  Patient Details  Name: Shelby Reese MRN: [id number] Date of Birth: [birth year]   Medicare Important Message Given:  Yes    Zan Orlick 12/02/2017, 1:01 PM

## 2017-12-02 NOTE — Consult Note (Addendum)
   [facility] CM Inpatient Consult   12/02/2017  Shelby Reese 03-30-1935 [id number]  Chart reviewed for admissions and for Medicare ACO. Patient is admitted with Irrigation and Debridement (I & D) of her right knee with Wound VAC and has planned I & D per MD notes. Current disposition is for skilled nursing facility placement. Will follow for disposition needs as appropriate for questions or changes to current plan.   Please contact:  Natividad Brood, RN BSN [facility] Liaison  [phone] business mobile phone Toll free office [phone]  ' Patient had been out reached by [facility] Coordinator and had declined services as she had scheduled admission planned.

## 2017-12-02 NOTE — Plan of Care (Signed)
  Progressing Education: Knowledge of General Education information will improve 12/02/2017 1110 - Progressing by Harlin Heys, RN Health Behavior/Discharge Planning: Ability to manage health-related needs will improve 12/02/2017 1110 - Progressing by Harlin Heys, RN Clinical Measurements: Ability to maintain clinical measurements within normal limits will improve 12/02/2017 1110 - Progressing by Harlin Heys, RN Will remain free from infection 12/02/2017 1110 - Progressing by Harlin Heys, RN Diagnostic test results will improve 12/02/2017 1110 - Progressing by Harlin Heys, RN Respiratory complications will improve 12/02/2017 1110 - Progressing by Harlin Heys, RN Cardiovascular complication will be avoided 12/02/2017 1110 - Progressing by Harlin Heys, RN Activity: Risk for activity intolerance will decrease 12/02/2017 1110 - Progressing by Harlin Heys, RN Nutrition: Adequate nutrition will be maintained 12/02/2017 1110 - Progressing by Harlin Heys, RN Coping: Level of anxiety will decrease 12/02/2017 1110 - Progressing by Harlin Heys, RN Elimination: Will not experience complications related to bowel motility 12/02/2017 1110 - Progressing by Harlin Heys, RN Will not experience complications related to urinary retention 12/02/2017 1110 - Progressing by Harlin Heys, RN Pain Managment: General experience of comfort will improve 12/02/2017 1110 - Progressing by Harlin Heys, RN Safety: Ability to remain free from injury will improve 12/02/2017 1110 - Progressing by Harlin Heys, RN Skin Integrity: Risk for impaired skin integrity will decrease 12/02/2017 1110 - Progressing by Harlin Heys, RN

## 2017-12-03 ENCOUNTER — Inpatient Hospital Stay (HOSPITAL_COMMUNITY): Payer: Medicare Other | Admitting: Certified Registered"

## 2017-12-03 ENCOUNTER — Encounter (HOSPITAL_COMMUNITY): Payer: Self-pay

## 2017-12-03 ENCOUNTER — Encounter (HOSPITAL_COMMUNITY)
Admission: RE | Disposition: A | Discharge status: Skilled Nursing Facility | Payer: Self-pay | Source: Home / Self Care | Attending: Orthopedic Surgery

## 2017-12-03 HISTORY — PX: I & D EXTREMITY: SHX5045

## 2017-12-03 LAB — SURGICAL PCR SCREEN
MRSA, PCR: NEGATIVE
Staphylococcus aureus: NEGATIVE

## 2017-12-03 SURGERY — IRRIGATION AND DEBRIDEMENT EXTREMITY
Anesthesia: General | Site: Knee | Laterality: Right

## 2017-12-03 MED ORDER — BISACODYL 10 MG RE SUPP: 10.0000 mg | Freq: Every day | RECTAL | Status: DC | PRN

## 2017-12-03 MED ORDER — METHOCARBAMOL 500 MG PO TABS: 500.0000 mg | ORAL_TABLET | Freq: Four times a day (QID) | ORAL | Status: DC | PRN

## 2017-12-03 MED ORDER — LACTATED RINGERS IV SOLN
INTRAVENOUS | Status: DC | PRN
  Administered 2017-12-03: 13:00:00 via INTRAVENOUS

## 2017-12-03 MED ORDER — FENTANYL CITRATE (PF) 100 MCG/2ML IJ SOLN
INTRAMUSCULAR | Status: AC
  Administered 2017-12-03: 50 ug via INTRAVENOUS
  Filled 2017-12-03: qty 2

## 2017-12-03 MED ORDER — FENTANYL CITRATE (PF) 250 MCG/5ML IJ SOLN
INTRAMUSCULAR | Status: AC
  Filled 2017-12-03: qty 5

## 2017-12-03 MED ORDER — ONDANSETRON HCL 4 MG/2ML IJ SOLN: 4.0000 mg | Freq: Once | INTRAMUSCULAR | Status: DC | PRN

## 2017-12-03 MED ORDER — FENTANYL CITRATE (PF) 100 MCG/2ML IJ SOLN
INTRAMUSCULAR | Status: DC | PRN
  Administered 2017-12-03: 25 ug via INTRAVENOUS
  Administered 2017-12-03 (×2): 50 ug via INTRAVENOUS

## 2017-12-03 MED ORDER — ONDANSETRON HCL 4 MG/2ML IJ SOLN: 4.0000 mg | Freq: Four times a day (QID) | INTRAMUSCULAR | Status: DC | PRN

## 2017-12-03 MED ORDER — ACETAMINOPHEN 650 MG RE SUPP: 650.0000 mg | RECTAL | Status: DC | PRN

## 2017-12-03 MED ORDER — ACETAMINOPHEN 325 MG PO TABS: 650.0000 mg | ORAL_TABLET | ORAL | Status: DC | PRN

## 2017-12-03 MED ORDER — ONDANSETRON HCL 4 MG PO TABS: 4.0000 mg | ORAL_TABLET | Freq: Four times a day (QID) | ORAL | Status: DC | PRN

## 2017-12-03 MED ORDER — LACTATED RINGERS IV SOLN
INTRAVENOUS | Status: DC
  Administered 2017-12-03: 13:00:00 via INTRAVENOUS

## 2017-12-03 MED ORDER — MAGNESIUM CITRATE PO SOLN: 1.0000 | Freq: Once | ORAL | Status: DC | PRN

## 2017-12-03 MED ORDER — DEXAMETHASONE SODIUM PHOSPHATE 4 MG/ML IJ SOLN
INTRAMUSCULAR | Status: DC | PRN
  Administered 2017-12-03: 8 mg via INTRAVENOUS

## 2017-12-03 MED ORDER — SODIUM CHLORIDE 0.9 % IV SOLN: INTRAVENOUS | Status: DC

## 2017-12-03 MED ORDER — DOCUSATE SODIUM 100 MG PO CAPS: 100.0000 mg | ORAL_CAPSULE | Freq: Two times a day (BID) | ORAL | Status: DC

## 2017-12-03 MED ORDER — SODIUM CHLORIDE 0.9 % IR SOLN
Status: DC | PRN
  Administered 2017-12-03: 1000 mL

## 2017-12-03 MED ORDER — SODIUM CHLORIDE 0.9 % IR SOLN
Status: DC | PRN
  Administered 2017-12-03: 3000 mL

## 2017-12-03 MED ORDER — MIDAZOLAM HCL 2 MG/2ML IJ SOLN
INTRAMUSCULAR | Status: AC
  Filled 2017-12-03: qty 2

## 2017-12-03 MED ORDER — FENTANYL CITRATE (PF) 100 MCG/2ML IJ SOLN
25.0000 ug | INTRAMUSCULAR | Status: DC | PRN
  Administered 2017-12-03 (×3): 50 ug via INTRAVENOUS

## 2017-12-03 MED ORDER — ONDANSETRON HCL 4 MG/2ML IJ SOLN
INTRAMUSCULAR | Status: DC | PRN
  Administered 2017-12-03: 4 mg via INTRAVENOUS

## 2017-12-03 MED ORDER — METOCLOPRAMIDE HCL 5 MG PO TABS: 5.0000 mg | ORAL_TABLET | Freq: Three times a day (TID) | ORAL | Status: DC | PRN

## 2017-12-03 MED ORDER — DEXAMETHASONE SODIUM PHOSPHATE 10 MG/ML IJ SOLN
INTRAMUSCULAR | Status: AC
  Filled 2017-12-03: qty 1

## 2017-12-03 MED ORDER — 0.9 % SODIUM CHLORIDE (POUR BTL) OPTIME
TOPICAL | Status: DC | PRN
  Administered 2017-12-03: 1000 mL

## 2017-12-03 MED ORDER — CLINDAMYCIN PHOSPHATE 600 MG/50ML IV SOLN
600.0000 mg | Freq: Four times a day (QID) | INTRAVENOUS | Status: DC
  Administered 2017-12-03 – 2017-12-05 (×7): 600 mg via INTRAVENOUS
  Filled 2017-12-03 (×10): qty 50

## 2017-12-03 MED ORDER — PHENYLEPHRINE 40 MCG/ML (10ML) SYRINGE FOR IV PUSH (FOR BLOOD PRESSURE SUPPORT)
PREFILLED_SYRINGE | INTRAVENOUS | Status: AC
  Filled 2017-12-03: qty 10

## 2017-12-03 MED ORDER — POLYETHYLENE GLYCOL 3350 17 G PO PACK: 17.0000 g | PACK | Freq: Every day | ORAL | Status: DC | PRN

## 2017-12-03 MED ORDER — METOCLOPRAMIDE HCL 5 MG/ML IJ SOLN: 5.0000 mg | Freq: Three times a day (TID) | INTRAMUSCULAR | Status: DC | PRN

## 2017-12-03 MED ORDER — EPHEDRINE 5 MG/ML INJ
INTRAVENOUS | Status: AC
  Filled 2017-12-03: qty 10

## 2017-12-03 MED ORDER — PROPOFOL 10 MG/ML IV BOLUS
INTRAVENOUS | Status: DC | PRN
  Administered 2017-12-03: 200 mg via INTRAVENOUS

## 2017-12-03 MED ORDER — LIDOCAINE 2% (20 MG/ML) 5 ML SYRINGE
INTRAMUSCULAR | Status: AC
  Filled 2017-12-03: qty 5

## 2017-12-03 MED ORDER — ONDANSETRON HCL 4 MG/2ML IJ SOLN
INTRAMUSCULAR | Status: AC
  Filled 2017-12-03: qty 2

## 2017-12-03 MED ORDER — LIDOCAINE 2% (20 MG/ML) 5 ML SYRINGE
INTRAMUSCULAR | Status: DC | PRN
  Administered 2017-12-03: 100 mg via INTRAVENOUS

## 2017-12-03 MED ORDER — METHOCARBAMOL 1000 MG/10ML IJ SOLN: 500.0000 mg | Freq: Four times a day (QID) | INTRAMUSCULAR | Status: DC | PRN

## 2017-12-03 SURGICAL SUPPLY — 45 items
APL SKNCLS STERI-STRIP NONHPOA (GAUZE/BANDAGES/DRESSINGS) ×8
BENZOIN TINCTURE PRP APPL 2/3 (GAUZE/BANDAGES/DRESSINGS) ×16 IMPLANT
BLADE SURG 21 STRL SS (BLADE) ×3 IMPLANT
BNDG COHESIVE 6X5 TAN STRL LF (GAUZE/BANDAGES/DRESSINGS) ×4 IMPLANT
BNDG GAUZE ELAST 4 BULKY (GAUZE/BANDAGES/DRESSINGS) ×6 IMPLANT
CANISTER WOUNDNEG PRESSURE 500 (CANNISTER) ×2 IMPLANT
CASSETTE VERAFLO VERALINK (MISCELLANEOUS) ×4 IMPLANT
COVER SURGICAL LIGHT HANDLE (MISCELLANEOUS) ×6 IMPLANT
DRAPE INCISE IOBAN 66X45 STRL (DRAPES) ×4 IMPLANT
DRAPE U-SHAPE 47X51 STRL (DRAPES) ×3 IMPLANT
DRSG ADAPTIC 3X8 NADH LF (GAUZE/BANDAGES/DRESSINGS) ×3 IMPLANT
DRSG VAC ATS MED SENSATRAC (GAUZE/BANDAGES/DRESSINGS) ×2 IMPLANT
DURAPREP 26ML APPLICATOR (WOUND CARE) ×1 IMPLANT
ELECT CAUTERY BLADE 6.4 (BLADE) ×2 IMPLANT
ELECT REM PT RETURN 9FT ADLT (ELECTROSURGICAL)
ELECTRODE REM PT RTRN 9FT ADLT (ELECTROSURGICAL) IMPLANT
GAUZE SPONGE 4X4 12PLY STRL (GAUZE/BANDAGES/DRESSINGS) ×3 IMPLANT
GLOVE BIOGEL PI IND STRL 6.5 (GLOVE) IMPLANT
GLOVE BIOGEL PI IND STRL 9 (GLOVE) ×1 IMPLANT
GLOVE BIOGEL PI INDICATOR 6.5 (GLOVE) ×8
GLOVE BIOGEL PI INDICATOR 9 (GLOVE) ×2
GLOVE SKINSENSE NS SZ6.5 (GLOVE) ×2
GLOVE SKINSENSE STRL SZ6.5 (GLOVE) IMPLANT
GLOVE SURG ORTHO 9.0 STRL STRW (GLOVE) ×3 IMPLANT
GOWN STRL REUS W/ TWL XL LVL3 (GOWN DISPOSABLE) ×2 IMPLANT
GOWN STRL REUS W/TWL XL LVL3 (GOWN DISPOSABLE) ×6
HANDPIECE INTERPULSE COAX TIP (DISPOSABLE) ×3
KIT BASIN OR (CUSTOM PROCEDURE TRAY) ×3 IMPLANT
KIT ROOM TURNOVER OR (KITS) ×3 IMPLANT
MANIFOLD NEPTUNE II (INSTRUMENTS) ×3 IMPLANT
NS IRRIG 1000ML POUR BTL (IV SOLUTION) ×3 IMPLANT
PACK ORTHO EXTREMITY (CUSTOM PROCEDURE TRAY) ×3 IMPLANT
PAD ARMBOARD 7.5X6 YLW CONV (MISCELLANEOUS) ×6 IMPLANT
PAD CAST 4YDX4 CTTN HI CHSV (CAST SUPPLIES) IMPLANT
PADDING CAST COTTON 4X4 STRL (CAST SUPPLIES) ×6
SET HNDPC FAN SPRY TIP SCT (DISPOSABLE) IMPLANT
SPONGE LAP 18X18 X RAY DECT (DISPOSABLE) ×2 IMPLANT
STOCKINETTE IMPERVIOUS 9X36 MD (GAUZE/BANDAGES/DRESSINGS) IMPLANT
SWAB COLLECTION DEVICE MRSA (MISCELLANEOUS) ×3 IMPLANT
SWAB CULTURE ESWAB REG 1ML (MISCELLANEOUS) IMPLANT
TOWEL OR 17X26 10 PK STRL BLUE (TOWEL DISPOSABLE) ×3 IMPLANT
TUBE CONNECTING 12'X1/4 (SUCTIONS) ×1
TUBE CONNECTING 12X1/4 (SUCTIONS) ×2 IMPLANT
TUBING VACUUM 7/8 X6' (MISCELLANEOUS) ×2 IMPLANT
YANKAUER SUCT BULB TIP NO VENT (SUCTIONS) ×3 IMPLANT

## 2017-12-03 NOTE — Op Note (Signed)
11/28/2017 - 12/03/2017  2:17 PM  PATIENT:  Shelby Reese    PRE-OPERATIVE DIAGNOSIS:  Chronic Wound Right Knee  POST-OPERATIVE DIAGNOSIS:  Same  PROCEDURE:  REPEAT IRRIGATION AND DEBRIDEMENT RIGHT KNEE AND APPLICATION OF A WOUND VAC.  SURGEON:  [name], MD  PHYSICIAN ASSISTANT:None ANESTHESIA:   General  PREOPERATIVE INDICATIONS:  [name] is a  83 y.o. female with a diagnosis of Chronic Wound Right Knee who failed conservative measures and elected for surgical management.    The risks benefits and alternatives were discussed with the patient preoperatively including but not limited to the risks of infection, bleeding, nerve injury, cardiopulmonary complications, the need for revision surgery, among others, and the patient was willing to proceed.  OPERATIVE IMPLANTS: Cleanse choice installation wound VAC x2  OPERATIVE FINDINGS: Excellent beefy granulation tissue for 90% of the wound.  OPERATIVE PROCEDURE: Patient was brought the operating room and underwent a general anesthetic.  After adequate levels of anesthesia were obtained patient's right lower extremity was prepped using Betadine paint and draped into a sterile field a timeout was called.  90% of the wound had good healthy granulation tissue.  A 21 blade knife and rondure were used to remove further necrotic tissue.  The wound was irrigated with pulsatile lavage.  After irrigation and debridement the wound measures 25 x 15 cm.  The cleanse choice reticulated foam was placed against the wound the solid foam was a placed on top and this was then covered with Ioban.  This had a good suction fit the insufflation was decreased to 10 cc patient had increased maceration posteriorly from drainage.  Patient was extubated taken the PACU in stable condition  Plan to return to the operating room on Friday possible split-thickness skin graft.   DISCHARGE PLANNING:  Antibiotic duration:3 days  Weightbearing: WBAT right   Pain  medication: continue current meds  Dressing care/ Wound MLY:[id number] and VAC  Ambulatory devices:as needed  Discharge to: evaluate after friday  Follow-up: In the office 1 week post operative.

## 2017-12-03 NOTE — Anesthesia Preprocedure Evaluation (Signed)
Anesthesia Evaluation  Patient identified by MRN, date of birth, ID band Patient awake    Reviewed: Allergy & Precautions, NPO status , Patient's Chart, lab work & pertinent test results  Airway Mallampati: II  TM Distance: >3 FB Neck ROM: Full    Dental  (+) Teeth Intact, Dental Advisory Given, Chipped   Pulmonary sleep apnea , former smoker,    breath sounds clear to auscultation       Cardiovascular hypertension, Pt. on medications (-) angina+ Peripheral Vascular Disease  (-) Past MI  Rhythm:Regular Rate:Normal     Neuro/Psych negative neurological ROS  negative psych ROS   GI/Hepatic Neg liver ROS, GERD  Medicated,  Endo/Other  Hypothyroidism Morbid obesity  Renal/GU Renal InsufficiencyRenal disease     Musculoskeletal  (+) Arthritis , Fibromyalgia -Ulcer of right knee   Abdominal   Peds  Hematology  (+) Blood dyscrasia, anemia ,   Anesthesia Other Findings Day of surgery medications reviewed with the patient.  Reproductive/Obstetrics                             Anesthesia Physical  Anesthesia Plan  ASA: III  Anesthesia Plan: General   Post-op Pain Management:    Induction: Intravenous  PONV Risk Score and Plan: 3 and Ondansetron and Dexamethasone  Airway Management Planned: LMA  Additional Equipment:   Intra-op Plan:   Post-operative Plan: Extubation in OR  Informed Consent: I have reviewed the patients History and Physical, chart, labs and discussed the procedure including the risks, benefits and alternatives for the proposed anesthesia with the patient or authorized representative who has indicated his/her understanding and acceptance.   Dental advisory given  Plan Discussed with: CRNA and Anesthesiologist  Anesthesia Plan Comments: (Plan GA with LMA)        Anesthesia Quick Evaluation

## 2017-12-03 NOTE — Interval H&P Note (Signed)
History and Physical Interval Note:  12/03/2017 6:33 AM  Shelby Reese  has presented today for surgery, with the diagnosis of Chronic Wound Right Knee  The various methods of treatment have been discussed with the patient and family. After consideration of risks, benefits and other options for treatment, the patient has consented to  Procedure(s): REPEAT IRRIGATION AND DEBRIDEMENT RIGHT KNEE (Right) as a surgical intervention .  The patient's history has been reviewed, patient examined, no change in status, stable for surgery.  I have reviewed the patient's chart and labs.  Questions were answered to the patient's satisfaction.     [name]

## 2017-12-03 NOTE — Anesthesia Procedure Notes (Addendum)
Procedure Name: LMA Insertion Date/Time: 12/03/2017 1:31 PM Performed by: Orlie Dakin, CRNA Pre-anesthesia Checklist: Patient identified, Emergency Drugs available, Suction available, Patient being monitored and Timeout performed Patient Re-evaluated:Patient Re-evaluated prior to induction Oxygen Delivery Method: Circle system utilized Preoxygenation: Pre-oxygenation with 100% oxygen Induction Type: IV induction Ventilation: Mask ventilation without difficulty LMA: LMA inserted LMA Size: 4.0 Tube type: Oral Number of attempts: 1 Placement Confirmation: positive ETCO2 and breath sounds checked- equal and bilateral Tube secured with: Tape Dental Injury: Teeth and Oropharynx as per pre-operative assessment

## 2017-12-03 NOTE — Transfer of Care (Signed)
Immediate Anesthesia Transfer of Care Note  Patient: Shelby Reese  Procedure(s) Performed: REPEAT IRRIGATION AND DEBRIDEMENT RIGHT KNEE AND APPLICATION OF A WOUND VAC. (Right Knee)  Patient Location: PACU  Anesthesia Type:General  Level of Consciousness: awake, alert  and patient cooperative  Airway & Oxygen Therapy: Patient Spontanous Breathing and Patient connected to face mask oxygen  Post-op Assessment: Report given to RN and Post -op Vital signs reviewed and stable  Post vital signs: Reviewed and stable  Last Vitals:  Vitals:   12/02/17 1959 12/03/17 0445  BP: (!) 119/51 (!) 167/60  Pulse: 90 90  Resp: 18 18  Temp: 37.1 C 37.3 C  SpO2: 96% 96%    Last Pain:  Vitals:   12/03/17 0619  TempSrc:   PainSc: 4       Patients Stated Pain Goal: 0 (17/79/39 0300)  Complications: No apparent anesthesia complications

## 2017-12-04 ENCOUNTER — Encounter (HOSPITAL_COMMUNITY): Payer: Self-pay | Admitting: Orthopedic Surgery

## 2017-12-04 ENCOUNTER — Encounter: Payer: Medicare Other | Admitting: Hematology and Oncology

## 2017-12-04 NOTE — H&P (View-Only) (Signed)
Patient ID: Shelby Reese, female   DOB: [birth year], 83 y.o.   MRN: [id number] Patient is alert oriented and in good spirits this morning.  Patient is postoperative day 1 for revision irrigation and debridement of the right knee wound.  Patient has approximately 75% excellent healthy granulation tissue.  The remainder of the wound was further debrided.  The wound VAC is functioning well.  Discussed that she had increased blisters on the right lower extremity secondary to venous swelling will need to plan for revision surgery tomorrow Friday for possible split-thickness skin graft.  Patient will need medical compression socks.

## 2017-12-04 NOTE — Progress Notes (Signed)
Pt telemetry clarified with Dr. Sharol Given; verbal order received to discontinue. Telemetry discontinued. Delia Heady RN

## 2017-12-04 NOTE — Progress Notes (Signed)
Physical Therapy Treatment Patient Details Name: Shelby Reese MRN: [id number] DOB: 18-Mar-1935 Today's Date: 12/04/2017    History of Present Illness 83yo female who had limb salvage intervention with I&D and wound vac placement, however at home she was non compliant with wound care nurse and recommendations. She received repeat I&D to knee wound with vac placement 11/28/17. PMH anemia, CKD, fibromyalgia, GERD, hernia, HTN, hypothyroidism, lymphedema, OA, PVD, knee ulcer, venous insufficiency, hx multiople I&Ds with vacs     PT Comments    Patient received in bed, pleasant and willing to participate in session but with strange behaviors during session such as speaking to/yelling at bed and IV alarms. She requires Min assist for bed mobility, mostly to assist in LE management to reduce pain during supine to sit, and was able to perform sit to stand with ModAx2 with RW. Performed stand-pivot transfer to the chair with min guard and majority of assistance for line management with rolling walker. She was left up in the chair with all needs met, CNA educated regarding advice for +2 transfer/back to bed early in afternoon due to severe difficulty with mobility when fatigued.    Follow Up Recommendations  SNF     Equipment Recommendations  None recommended by PT    Recommendations for Other Services       Precautions / Restrictions Precautions Precautions: Fall Precaution Comments: wound vac  Restrictions Weight Bearing Restrictions: Yes RLE Weight Bearing: Weight bearing as tolerated LLE Weight Bearing: Weight bearing as tolerated    Mobility  Bed Mobility Overal bed mobility: Needs Assistance Bed Mobility: Supine to Sit     Supine to sit: Min assist     General bed mobility comments: LE management/to assist in reducing pain in LEs with transfer   Transfers Overall transfer level: Needs assistance Equipment used: Rolling walker (2 wheeled) Transfers: Sit to/from [employer] Sit to Stand: Mod assist;+2 physical assistance Stand pivot transfers: Min guard       General transfer comment: patient able to perform sit to stand with RW and Mod assist x2 today, able to perform stand pivot transfer to chair with min guard and assist for line management   Ambulation/Gait             General Gait Details: DNT    Stairs            Wheelchair Mobility    Modified Rankin (Stroke Patients Only)       Balance Overall balance assessment: Needs assistance;History of Falls Sitting-balance support: Bilateral upper extremity supported;Feet supported Sitting balance-Leahy Scale: Good     Standing balance support: Bilateral upper extremity supported;During functional activity Standing balance-Leahy Scale: Fair Standing balance comment: reliant on RW                             Cognition Arousal/Alertness: Awake/alert Behavior During Therapy: WFL for tasks assessed/performed Overall Cognitive Status: No family/caregiver present to determine baseline cognitive functioning                                 General Comments: patient pleasant but often with strange behavior such as yelling at bed alarms or IV alarms during session       Exercises      General Comments        Pertinent Vitals/Pain Pain Assessment: 0-10 Pain Score: 6  Pain Location:  R LE wound sites, posterior gastroc  Pain Descriptors / Indicators: Sharp;Sore Pain Intervention(s): Limited activity within patient's tolerance;Repositioned;Monitored during session    Home Living                      Prior Function            PT Goals (current goals can now be found in the care plan section) Acute Rehab PT Goals Patient Stated Goal: to get well  PT Goal Formulation: With patient Time For Goal Achievement: 12/12/17 Potential to Achieve Goals: Good Progress towards PT goals: Progressing toward goals    Frequency    Min  2X/week      PT Plan Current plan remains appropriate    Co-evaluation              AM-PAC PT "6 Clicks" Daily Activity  Outcome Measure  Difficulty turning over in bed (including adjusting bedclothes, sheets and blankets)?: Unable Difficulty moving from lying on back to sitting on the side of the bed? : Unable Difficulty sitting down on and standing up from a chair with arms (e.g., wheelchair, bedside commode, etc,.)?: Unable Help needed moving to and from a bed to chair (including a wheelchair)?: A Little Help needed walking in hospital room?: A Little Help needed climbing 3-5 steps with a railing? : A Lot 6 Click Score: 11    End of Session Equipment Utilized During Treatment: Gait belt Activity Tolerance: Patient tolerated treatment well Patient left: in chair;with call bell/phone within reach Nurse Communication: Mobility status;Other (comment)(advised CNA of recommendation for +2, also adivsed to get back to bed earlier in afternoon due to difficulty with mobility as fatigue increases during the day ) PT Visit Diagnosis: Unsteadiness on feet (R26.81);Muscle weakness (generalized) (M62.81);Difficulty in walking, not elsewhere classified (R26.2);History of falling (Z91.81)     Time: 7416-3845 PT Time Calculation (min) (ACUTE ONLY): 28 min  Charges:  $Therapeutic Activity: 23-37 mins                    G Codes:       Deniece Ree PT, DPT, CBIS  Supplemental Physical Therapist [place]   Pager [phone]

## 2017-12-04 NOTE — Anesthesia Postprocedure Evaluation (Signed)
Anesthesia Post Note  Patient: Edwin Dada  Procedure(s) Performed: REPEAT IRRIGATION AND DEBRIDEMENT RIGHT KNEE AND APPLICATION OF A WOUND VAC. (Right Knee)     Patient location during evaluation: PACU Anesthesia Type: General Level of consciousness: awake, awake and alert and oriented Pain management: pain level controlled Vital Signs Assessment: post-procedure vital signs reviewed and stable Respiratory status: spontaneous breathing, nonlabored ventilation and respiratory function stable Cardiovascular status: stable Postop Assessment: no headache, no backache and no signs of nausea or vomiting Anesthetic complications: no    Last Vitals:  Vitals:   12/03/17 2142 12/04/17 0646  BP: (!) 141/46 (!) 150/50  Pulse: 88 80  Resp: 14 15  Temp: 37.3 C 36.8 C  SpO2: 96% 96%    Last Pain:  Vitals:   12/04/17 1744  TempSrc:   PainSc: 6                  Catalina Gravel

## 2017-12-04 NOTE — Clinical Social Work Note (Signed)
Clinical Social Work Assessment  Patient Details  Name: Shelby Reese MRN: [id number] Date of Birth: 12/04/1934  Date of referral:  12/04/17               Reason for consult:  Facility Placement                Permission sought to share information with:  Chartered certified accountant granted to share information::  Yes, Verbal Permission Granted  Name::     Shelby Reese  Agency::  SNF  Relationship::  Spouse  Contact Information:     Housing/Transportation Living arrangements for the past 2 months:  [place] of Information:  Patient Patient Interpreter Needed:  None Criminal Activity/Legal Involvement Pertinent to Current Situation/Hospitalization:  No - Comment as needed Significant Relationships:  Spouse Lives with:  Spouse Do you feel safe going back to the place where you live?  No Need for family participation in patient care:  Yes (Comment)  Care giving concerns:  Pt resides at home with spouse. Pt indicates she cannot return home because spouse cannot care for her. She described symptoms that may relate to dementia or other cognitive decline.  She became tearful during assessment and indicated that she is concerned about her home. CSW explored further and she indicated that she does not know if she will have a house to go home to as she handles paying the bills and managing the home needs.  CSW validated and repeated to patient that she has been hospitalized for about 6 days and prior to hospitalization she was home managing the home bills/expenses prior to hospitalization. Pt confirmed same and CSW validated and explained to patient that patient would have taken care of home needs for December and possible some needs for the month of January.  Pt agreed and indicated that she has some automatic payments set up. Pt then indicated that she does not have any family other than spouse and has no one to assist her with getting personal items at home.  CSW inquired if patient reached out to spouse and she confirmed that he is not answering the telephone. CSW will try and reach him.  CSW asked patient if husband has been to hospital to see her since she has been hospitalized and she confirmed same. CSW validated her concerns.   CSW then provided active listening and assured patient that she would not return home from hospital but would need to receive short term rehab, then return   Social Worker assessment / plan:  CSW will f/u for disposition.  Employment status:  Retired Forensic scientist:  Medicare PT Recommendations:  [place] / Referral to community resources:  [place]  Patient/Family's Response to care:  Patient reported no issues with care received. Pt accepts recommendations for SNF and agrees as she indicated her spouse cannot assist at home.  Patient/Family's Understanding of and Emotional Response to Diagnosis, Current Treatment, and Prognosis:  Patient has understanding of medical conditions and the need for further rehab once she has completed care in hospital. 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 CSW validated her concerns and will provide a SNF list once offers are made. Patient voiced agreement with same. Pt was more concerns about her home and bills being paid while she was not home. CSW validated her concerns. No other issues  or concerns identified.  Emotional Assessment Appearance:  Appears stated age Attitude/Demeanor/Rapport:  Crying(Labile) Affect (typically observed):  Accepting, Appropriate Orientation:  Oriented to Situation, Oriented to Self, Oriented to Place, Oriented to  Time Alcohol / Substance use:  Not Applicable Psych involvement (Current and /or in the community):  No (Comment)  Discharge Needs  Concerns to be addressed:  Discharge Planning  Concerns Readmission within the last 30 days:  Yes Current discharge risk:  Dependent with Mobility, Physical Impairment Barriers to Discharge:  No Barriers Identified   Normajean Baxter, LCSW 12/04/2017, 12:46 PM

## 2017-12-04 NOTE — Progress Notes (Signed)
Patient ID: Shelby Reese, female   DOB: 02-Sep-1935, 83 y.o.   MRN: [id number] Patient is alert oriented and in good spirits this morning.  Patient is postoperative day 1 for revision irrigation and debridement of the right knee wound.  Patient has approximately 75% excellent healthy granulation tissue.  The remainder of the wound was further debrided.  The wound VAC is functioning well.  Discussed that she had increased blisters on the right lower extremity secondary to venous swelling will need to plan for revision surgery tomorrow Friday for possible split-thickness skin graft.  Patient will need medical compression socks.

## 2017-12-05 ENCOUNTER — Encounter (HOSPITAL_COMMUNITY)
Admission: RE | Disposition: A | Discharge status: Skilled Nursing Facility | Payer: Self-pay | Source: Home / Self Care | Attending: Orthopedic Surgery

## 2017-12-05 ENCOUNTER — Inpatient Hospital Stay (HOSPITAL_COMMUNITY): Payer: Medicare Other | Admitting: Anesthesiology

## 2017-12-05 DIAGNOSIS — S81001D Unspecified open wound, right knee, subsequent encounter: Secondary | ICD-10-CM

## 2017-12-05 HISTORY — PX: SKIN SPLIT GRAFT: SHX444

## 2017-12-05 SURGERY — APPLICATION, GRAFT, SKIN, SPLIT-THICKNESS
Anesthesia: General | Laterality: Right

## 2017-12-05 MED ORDER — FENTANYL CITRATE (PF) 100 MCG/2ML IJ SOLN
INTRAMUSCULAR | Status: AC
  Filled 2017-12-05: qty 2

## 2017-12-05 MED ORDER — ONDANSETRON HCL 4 MG PO TABS: 4.0000 mg | ORAL_TABLET | Freq: Four times a day (QID) | ORAL | Status: DC | PRN

## 2017-12-05 MED ORDER — OXYCODONE HCL 5 MG PO TABS
ORAL_TABLET | ORAL | Status: AC
  Filled 2017-12-05: qty 1

## 2017-12-05 MED ORDER — PROPOFOL 10 MG/ML IV BOLUS
INTRAVENOUS | Status: DC | PRN
  Administered 2017-12-05: 100 mg via INTRAVENOUS

## 2017-12-05 MED ORDER — FENTANYL CITRATE (PF) 250 MCG/5ML IJ SOLN
INTRAMUSCULAR | Status: AC
  Filled 2017-12-05: qty 5

## 2017-12-05 MED ORDER — METOCLOPRAMIDE HCL 5 MG/ML IJ SOLN: 5.0000 mg | Freq: Three times a day (TID) | INTRAMUSCULAR | Status: DC | PRN

## 2017-12-05 MED ORDER — METOCLOPRAMIDE HCL 5 MG PO TABS: 5.0000 mg | ORAL_TABLET | Freq: Three times a day (TID) | ORAL | Status: DC | PRN

## 2017-12-05 MED ORDER — FENTANYL CITRATE (PF) 100 MCG/2ML IJ SOLN
25.0000 ug | INTRAMUSCULAR | Status: DC | PRN
  Administered 2017-12-05 (×3): 50 ug via INTRAVENOUS

## 2017-12-05 MED ORDER — MAGNESIUM CITRATE PO SOLN: 1.0000 | Freq: Once | ORAL | Status: DC | PRN

## 2017-12-05 MED ORDER — METHOCARBAMOL 1000 MG/10ML IJ SOLN
500.0000 mg | Freq: Four times a day (QID) | INTRAVENOUS | Status: DC | PRN
  Filled 2017-12-05: qty 5

## 2017-12-05 MED ORDER — METOCLOPRAMIDE HCL 5 MG/ML IJ SOLN: 10.0000 mg | Freq: Once | INTRAMUSCULAR | Status: DC | PRN

## 2017-12-05 MED ORDER — ONDANSETRON HCL 4 MG/2ML IJ SOLN
INTRAMUSCULAR | Status: DC | PRN
  Administered 2017-12-05: 4 mg via INTRAVENOUS

## 2017-12-05 MED ORDER — CLINDAMYCIN PHOSPHATE 600 MG/50ML IV SOLN
600.0000 mg | Freq: Four times a day (QID) | INTRAVENOUS | Status: AC
  Administered 2017-12-05 – 2017-12-06 (×3): 600 mg via INTRAVENOUS
  Filled 2017-12-05 (×3): qty 50

## 2017-12-05 MED ORDER — PHENYLEPHRINE HCL 10 MG/ML IJ SOLN
INTRAMUSCULAR | Status: DC | PRN
  Administered 2017-12-05 (×4): 80 ug via INTRAVENOUS

## 2017-12-05 MED ORDER — SODIUM CHLORIDE 0.9 % IV SOLN
INTRAVENOUS | Status: DC
  Administered 2017-12-05: 11:00:00 via INTRAVENOUS

## 2017-12-05 MED ORDER — FENTANYL CITRATE (PF) 100 MCG/2ML IJ SOLN
INTRAMUSCULAR | Status: DC | PRN
  Administered 2017-12-05: 100 ug via INTRAVENOUS
  Administered 2017-12-05 (×3): 50 ug via INTRAVENOUS

## 2017-12-05 MED ORDER — ACETAMINOPHEN 650 MG RE SUPP: 650.0000 mg | RECTAL | Status: DC | PRN

## 2017-12-05 MED ORDER — LACTATED RINGERS IV SOLN
INTRAVENOUS | Status: DC | PRN
  Administered 2017-12-05: 07:00:00 via INTRAVENOUS

## 2017-12-05 MED ORDER — ACETAMINOPHEN 325 MG PO TABS
650.0000 mg | ORAL_TABLET | ORAL | Status: DC | PRN
  Administered 2017-12-05 – 2017-12-08 (×2): 650 mg via ORAL
  Filled 2017-12-05 (×3): qty 2

## 2017-12-05 MED ORDER — METHOCARBAMOL 500 MG PO TABS
500.0000 mg | ORAL_TABLET | Freq: Four times a day (QID) | ORAL | Status: DC | PRN
  Administered 2017-12-05 – 2017-12-08 (×7): 500 mg via ORAL
  Filled 2017-12-05 (×8): qty 1

## 2017-12-05 MED ORDER — PROPOFOL 10 MG/ML IV BOLUS
INTRAVENOUS | Status: AC
  Filled 2017-12-05: qty 20

## 2017-12-05 MED ORDER — MIDAZOLAM HCL 5 MG/5ML IJ SOLN
INTRAMUSCULAR | Status: DC | PRN
  Administered 2017-12-05: 2 mg via INTRAVENOUS

## 2017-12-05 MED ORDER — LIDOCAINE 2% (20 MG/ML) 5 ML SYRINGE
INTRAMUSCULAR | Status: AC
  Filled 2017-12-05: qty 5

## 2017-12-05 MED ORDER — MEPERIDINE HCL 25 MG/ML IJ SOLN: 6.2500 mg | INTRAMUSCULAR | Status: DC | PRN

## 2017-12-05 MED ORDER — PHENYLEPHRINE 40 MCG/ML (10ML) SYRINGE FOR IV PUSH (FOR BLOOD PRESSURE SUPPORT)
PREFILLED_SYRINGE | INTRAVENOUS | Status: AC
  Filled 2017-12-05: qty 10

## 2017-12-05 MED ORDER — DOCUSATE SODIUM 100 MG PO CAPS
100.0000 mg | ORAL_CAPSULE | Freq: Two times a day (BID) | ORAL | Status: DC
  Administered 2017-12-05 – 2017-12-08 (×6): 100 mg via ORAL
  Filled 2017-12-05 (×6): qty 1

## 2017-12-05 MED ORDER — MIDAZOLAM HCL 2 MG/2ML IJ SOLN
INTRAMUSCULAR | Status: AC
  Filled 2017-12-05: qty 2

## 2017-12-05 MED ORDER — LIDOCAINE HCL (CARDIAC) 20 MG/ML IV SOLN
INTRAVENOUS | Status: DC | PRN
  Administered 2017-12-05: 100 mg via INTRAVENOUS

## 2017-12-05 MED ORDER — 0.9 % SODIUM CHLORIDE (POUR BTL) OPTIME
TOPICAL | Status: DC | PRN
  Administered 2017-12-05 (×2): 1000 mL

## 2017-12-05 MED ORDER — POLYETHYLENE GLYCOL 3350 17 G PO PACK
17.0000 g | PACK | Freq: Every day | ORAL | Status: DC | PRN
  Filled 2017-12-05: qty 1

## 2017-12-05 MED ORDER — BISACODYL 10 MG RE SUPP: 10.0000 mg | Freq: Every day | RECTAL | Status: DC | PRN

## 2017-12-05 MED ORDER — ONDANSETRON HCL 4 MG/2ML IJ SOLN: 4.0000 mg | Freq: Four times a day (QID) | INTRAMUSCULAR | Status: DC | PRN

## 2017-12-05 SURGICAL SUPPLY — 55 items
ALLOGRAFT SKIN MESHD 125 SQ CM (Tissue) ×1 IMPLANT
APL SKNCLS STERI-STRIP NONHPOA (GAUZE/BANDAGES/DRESSINGS) ×5
BENZOIN TINCTURE PRP APPL 2/3 (GAUZE/BANDAGES/DRESSINGS) ×10 IMPLANT
BNDG CMPR 9X4 STRL LF SNTH (GAUZE/BANDAGES/DRESSINGS)
BNDG COHESIVE 6X5 TAN STRL LF (GAUZE/BANDAGES/DRESSINGS) ×2 IMPLANT
BNDG ESMARK 4X9 LF (GAUZE/BANDAGES/DRESSINGS) ×1 IMPLANT
BNDG GAUZE STRTCH 6 (GAUZE/BANDAGES/DRESSINGS) IMPLANT
CANISTER SUCTION 1500CC (MISCELLANEOUS) ×2 IMPLANT
COVER SURGICAL LIGHT HANDLE (MISCELLANEOUS) ×4 IMPLANT
CUFF TOURNIQUET SINGLE 18IN (TOURNIQUET CUFF) IMPLANT
CUFF TOURNIQUET SINGLE 24IN (TOURNIQUET CUFF) IMPLANT
DERMACARRIERS GRAFT 1 TO 1.5 (DISPOSABLE)
DRAPE INCISE IOBAN 66X45 STRL (DRAPES) ×2 IMPLANT
DRAPE U-SHAPE 47X51 STRL (DRAPES) ×3 IMPLANT
DRSG ADAPTIC 3X8 NADH LF (GAUZE/BANDAGES/DRESSINGS) ×4 IMPLANT
DRSG MEPITEL 4X7.2 (GAUZE/BANDAGES/DRESSINGS) ×3 IMPLANT
DRSG VAC ATS MED SENSATRAC (GAUZE/BANDAGES/DRESSINGS) ×2 IMPLANT
DURAPREP 26ML APPLICATOR (WOUND CARE) ×1 IMPLANT
ELECT REM PT RETURN 9FT ADLT (ELECTROSURGICAL) ×3
ELECTRODE REM PT RTRN 9FT ADLT (ELECTROSURGICAL) ×1 IMPLANT
GAUZE SPONGE 4X4 12PLY STRL (GAUZE/BANDAGES/DRESSINGS) IMPLANT
GLOVE BIOGEL PI IND STRL 9 (GLOVE) ×1 IMPLANT
GLOVE BIOGEL PI INDICATOR 9 (GLOVE) ×2
GLOVE SURG ORTHO 9.0 STRL STRW (GLOVE) ×1 IMPLANT
GOWN STRL REUS W/ TWL XL LVL3 (GOWN DISPOSABLE) ×2 IMPLANT
GOWN STRL REUS W/TWL XL LVL3 (GOWN DISPOSABLE) ×12
GRAFT DERMACARRIERS 1 TO 1.5 (DISPOSABLE) IMPLANT
GRAFT TISS MESH 125 BURN (Tissue) IMPLANT
GRAFT TISS THERASKIN 3X6 (Tissue) IMPLANT
KIT BASIN OR (CUSTOM PROCEDURE TRAY) ×3 IMPLANT
KIT ROOM TURNOVER OR (KITS) ×3 IMPLANT
MANIFOLD NEPTUNE II (INSTRUMENTS) ×1 IMPLANT
MICROMATRIX 1000MG (Tissue) ×3 IMPLANT
NDL HYPO 25GX1X1/2 BEV (NEEDLE) IMPLANT
NEEDLE HYPO 25GX1X1/2 BEV (NEEDLE) IMPLANT
NS IRRIG 1000ML POUR BTL (IV SOLUTION) ×5 IMPLANT
PACK ORTHO EXTREMITY (CUSTOM PROCEDURE TRAY) ×3 IMPLANT
PAD ARMBOARD 7.5X6 YLW CONV (MISCELLANEOUS) ×4 IMPLANT
PAD CAST 4YDX4 CTTN HI CHSV (CAST SUPPLIES) IMPLANT
PADDING CAST COTTON 4X4 STRL (CAST SUPPLIES)
PADDING CAST SYN 6 (CAST SUPPLIES) ×2
PADDING CAST SYNTHETIC 6X4 NS (CAST SUPPLIES) IMPLANT
SKIN MESHED 125 SQ CM (Tissue) ×3 IMPLANT
SOLUTION PARTIC MCRMTRX 1000MG (Tissue) IMPLANT
SUCTION FRAZIER HANDLE 10FR (MISCELLANEOUS) ×2
SUCTION TUBE FRAZIER 10FR DISP (MISCELLANEOUS) IMPLANT
SUT ETHILON 4 0 PS 2 18 (SUTURE) IMPLANT
SUT SILK 2 0 TIES 10X30 (SUTURE) ×2 IMPLANT
SYR CONTROL 10ML LL (SYRINGE) IMPLANT
TISSUE THERASKIN 3X6 (Tissue) ×3 IMPLANT
TOWEL OR 17X24 6PK STRL BLUE (TOWEL DISPOSABLE) ×3 IMPLANT
TOWEL OR 17X26 10 PK STRL BLUE (TOWEL DISPOSABLE) ×3 IMPLANT
TUBE CONNECTING 12'X1/4 (SUCTIONS) ×1
TUBE CONNECTING 12X1/4 (SUCTIONS) ×1 IMPLANT
WATER STERILE IRR 1000ML POUR (IV SOLUTION) ×1 IMPLANT

## 2017-12-05 NOTE — Interval H&P Note (Signed)
History and Physical Interval Note:  12/05/2017 6:25 AM  Shelby Reese  has presented today for surgery, with the diagnosis of Chronic Wound Right Knee  The various methods of treatment have been discussed with the patient and family. After consideration of risks, benefits and other options for treatment, the patient has consented to  Procedure(s): APPLY SPLIT THICKNESS SKIN GRAFT RIGHT KNEE WOUND, APPLY VAC (Right) as a surgical intervention .  The patient's history has been reviewed, patient examined, no change in status, stable for surgery.  I have reviewed the patient's chart and labs.  Questions were answered to the patient's satisfaction.     [name]

## 2017-12-05 NOTE — Anesthesia Procedure Notes (Signed)
Procedure Name: LMA Insertion Date/Time: 12/05/2017 7:46 AM Performed by: Purvis Kilts, CRNA Pre-anesthesia Checklist: Patient identified, Emergency Drugs available, Suction available, Patient being monitored and Timeout performed Patient Re-evaluated:Patient Re-evaluated prior to induction Oxygen Delivery Method: Circle system utilized Preoxygenation: Pre-oxygenation with 100% oxygen Induction Type: IV induction Ventilation: Mask ventilation without difficulty LMA: LMA inserted LMA Size: 4.0 Number of attempts: 1 Placement Confirmation: positive ETCO2 and breath sounds checked- equal and bilateral Tube secured with: Tape Dental Injury: Teeth and Oropharynx as per pre-operative assessment

## 2017-12-05 NOTE — Anesthesia Preprocedure Evaluation (Signed)
Anesthesia Evaluation  Patient identified by MRN, date of birth, ID band Patient awake    Reviewed: Allergy & Precautions, NPO status , Patient's Chart, lab work & pertinent test results  Airway Mallampati: II  TM Distance: >3 FB Neck ROM: Full    Dental  (+) Teeth Intact, Dental Advisory Given, Chipped   Pulmonary sleep apnea , former smoker,    breath sounds clear to auscultation       Cardiovascular hypertension, Pt. on medications (-) angina+ Peripheral Vascular Disease  (-) Past MI  Rhythm:Regular Rate:Normal     Neuro/Psych negative neurological ROS  negative psych ROS   GI/Hepatic Neg liver ROS, GERD  Medicated,  Endo/Other  Hypothyroidism Morbid obesity  Renal/GU Renal InsufficiencyRenal disease     Musculoskeletal  (+) Arthritis , Fibromyalgia -Ulcer of right knee   Abdominal   Peds  Hematology  (+) Blood dyscrasia, anemia ,   Anesthesia Other Findings Day of surgery medications reviewed with the patient.  Reproductive/Obstetrics                             Anesthesia Physical  Anesthesia Plan  ASA: III  Anesthesia Plan: General   Post-op Pain Management:    Induction: Intravenous  PONV Risk Score and Plan: 3 and Ondansetron and Dexamethasone  Airway Management Planned: LMA  Additional Equipment:   Intra-op Plan:   Post-operative Plan: Extubation in OR  Informed Consent: I have reviewed the patients History and Physical, chart, labs and discussed the procedure including the risks, benefits and alternatives for the proposed anesthesia with the patient or authorized representative who has indicated his/her understanding and acceptance.   Dental advisory given  Plan Discussed with: CRNA and Anesthesiologist  Anesthesia Plan Comments: (Plan GA with LMA)        Anesthesia Quick Evaluation

## 2017-12-05 NOTE — Anesthesia Postprocedure Evaluation (Signed)
Anesthesia Post Note  Patient: Shelby Reese  Procedure(s) Performed: SKIN GRAFT SPLIT THICKNESS WOUND KNEE, APPLY VAC (Right )     Patient location during evaluation: PACU Anesthesia Type: General Level of consciousness: awake and alert Pain management: pain level controlled Vital Signs Assessment: post-procedure vital signs reviewed and stable Respiratory status: spontaneous breathing, nonlabored ventilation, respiratory function stable and patient connected to nasal cannula oxygen Cardiovascular status: blood pressure returned to baseline and stable Postop Assessment: no apparent nausea or vomiting Anesthetic complications: no    Last Vitals:  Vitals:   12/05/17 0930 12/05/17 0935  BP:  (!) 150/52  Pulse: 85 77  Resp: 12 13  Temp:    SpO2: 95% 93%    Last Pain:  Vitals:   12/05/17 0928  TempSrc:   PainSc: 5                  Montez Hageman

## 2017-12-05 NOTE — Consult Note (Signed)
[facility] Nurse wound consult note Reason for Consult: Profore LLE Patient with R knee wound, surgical debridement and skin graft with ACell placed today in OR with NPWT dressing. Has compression wrap over NPWT dressing on the RLE Wound type: did not have reported wounds on admission on the LLE Today she has a medical device related pressure injury (MDRPI) on her flexor surface of the LLE, appears to be from pressure from the Profore wrap, consistent with deep tissue injury. Dr. Sharol Given in the room while [facility] nurse in to remove and replace. Orders to cover with silicone foam and replace 4 layer compression wrap Pressure Injury POA: No Measurement: 6cm x 4.5cm x 0cm  Wound bed: dark, non blanchable purple tissue  Drainage (amount, consistency, odor) none  Periwound: intact, minimal edema. Edema in toes Dressing procedure/placement/frequency: Silicone foam placed over the affected area.  4 layer compression placed on the LLE. Patient complaining of pain, Dr. Sharol Given and bedside nurse aware.   [facility] nurse will follow up early next week to assess for needed compression wrap change.   [place], [place], [place]

## 2017-12-05 NOTE — Op Note (Addendum)
12/05/2017  8:38 AM  PATIENT:  Shelby Reese    PRE-OPERATIVE DIAGNOSIS:  wound knee  POST-OPERATIVE DIAGNOSIS:  Same  PROCEDURE:  SKIN GRAFT SPLIT THICKNESS WOUND KNEE, APPLY VAC  SURGEON:  [name], MD  PHYSICIAN ASSISTANT:None ANESTHESIA:   General  PREOPERATIVE INDICATIONS:  AKI BURDIN is a  83 y.o. female with a diagnosis of wound knee who failed conservative measures and elected for surgical management.    The risks benefits and alternatives were discussed with the patient preoperatively including but not limited to the risks of infection, bleeding, nerve injury, cardiopulmonary complications, the need for revision surgery, among others, and the patient was willing to proceed.  OPERATIVE IMPLANTS: Split-thickness skin graft x2 sheets plus a cell powder 1 g.  OPERATIVE FINDINGS: Good granulating wound bed 100% granulation tissue.  OPERATIVE PROCEDURE:  Patient was brought to the operating room and underwent a general anesthetic.  After adequate levels of anesthesia were obtained patient's right lower extremity was prepped using Hibiclens and a brush this was dried and then patient's leg was prepped using Betadine paint and draped into a sterile field a timeout was called.  The wound bed was prepared for the skin graft.  Rondure and knife blade were used to remove the final fibrinous exudative tissue.  Wound bed is 25 x 20 cm.  After the wound bed was cleansed this was irrigated with normal saline there was good granulating tissue.  The skin graft was applied on top of the a cell powder.  A cell powder was used to fill the voids.  This was stapled in place.  A Mepitel dressing was then applied this was also stapled in place.  A wound VAC was applied this had a good suction fit patient was extubated taken to PACU in stable condition.  A multilayer compression wrap was applied to her leg decrease the swelling for the large venous stasis ulcers in the right leg.   DISCHARGE  PLANNING:  Antibiotic duration: Complete 24-hour antibiotics postoperatively.  Weightbearing: Weightbearing as tolerated.  Pain medication: Prescription for Percocet.  Dressing care/ Wound VAC: Patient will discharge with the Praveena plus portable wound pump this will stay in place until follow-up in the office next Thursday.  Ambulatory devices: Walker.  Discharge to: SNF monday  Follow-up: In the office 1 week post operative.

## 2017-12-05 NOTE — Transfer of Care (Signed)
Immediate Anesthesia Transfer of Care Note  Patient: Shelby Reese  Procedure(s) Performed: SKIN GRAFT SPLIT THICKNESS WOUND KNEE, APPLY VAC (Right )  Patient Location: PACU  Anesthesia Type:General  Level of Consciousness: awake  Airway & Oxygen Therapy: Patient Spontanous Breathing  Post-op Assessment: Report given to RN and Post -op Vital signs reviewed and stable  Post vital signs: Reviewed and stable  Last Vitals:  Vitals:   12/04/17 2201 12/05/17 0500  BP: (!) 140/43 (!) 147/54  Pulse: 88 79  Resp:  17  Temp: 37.3 C 36.7 C  SpO2: 99% 98%    Last Pain:  Vitals:   12/05/17 0525  TempSrc:   PainSc: Asleep      Patients Stated Pain Goal: 0 (99/35/70 1779)  Complications: No apparent anesthesia complications

## 2017-12-05 NOTE — Progress Notes (Addendum)
Patient ID: Shelby Reese, female   DOB: [birth year], 83 y.o.   MRN: [id number] Plan for discharge to SNF on Monday.  [place] to  discharge.  The hospital wound VAC will be changed out to a Praveena portable wound VAC and this will remain in place for 1 week.  Follow-up in the office in 1 week.

## 2017-12-05 NOTE — Progress Notes (Signed)
Pt had noted to have voided during shift except x1 unmeasured output incontinence. Pt bladder scanned for 390 and I&O cathed for 350 ml. UA had strong odor and appeared yellow/concentrated but pt denies any pain or discomfort. Pt placed on external catheter; pt advised to be NPO after midnight reference to Dr. Jess Barters note and pt voices agreement. Reported off to oncoming RN. Delia Heady RN

## 2017-12-06 ENCOUNTER — Other Ambulatory Visit: Payer: Self-pay

## 2017-12-06 MED ORDER — DULOXETINE HCL 20 MG PO CPEP
20.0000 mg | ORAL_CAPSULE | Freq: Every day | ORAL | Status: DC
  Administered 2017-12-06 – 2017-12-08 (×3): 20 mg via ORAL
  Filled 2017-12-06 (×3): qty 1

## 2017-12-06 NOTE — Progress Notes (Signed)
Pt sleeping upon me entering the room. When she awoke complained of 9/10 pain. Pt states she has not had a bowel movement this admission - states because she has not eaten anything she does not need any laxatives. Educated patient on possible need for laxatives with pain medications, pt continues to refuse.   Fritz Pickerel, RN

## 2017-12-06 NOTE — Progress Notes (Signed)
   Subjective:  Patient is c/o pain in BLEs Tearful, not making sense with her thoughts and ideas  Objective:   VITALS:   Vitals:   12/05/17 1442 12/05/17 2129 12/05/17 2351 12/06/17 0500  BP: (!) 154/54 (!) 109/59 (!) 126/55 (!) 164/46  Pulse: 82 88  86  Resp: 16 16  (!) 21  Temp: 98.1 F (36.7 C) 98 F (36.7 C)    TempSrc: Oral Oral    SpO2: 97% 98% 96% 100%  Height:        Dressings c/d/i VAC intact   Lab Results  Component Value Date   WBC 12.8 (H) 11/28/2017   HGB 8.0 (L) 11/28/2017   HCT 27.5 (L) 11/28/2017   MCV 81.4 11/28/2017   PLT 518 (H) 11/28/2017     Assessment/Plan:  1 Day Post-Op   - patient to stay through the weekend and d/c to SNF Monday with prevena - cymbalta added for peripheral neuropathy, dystrophic pain  Eduard Roux 12/06/2017, 9:24 AM [phone]

## 2017-12-06 NOTE — Plan of Care (Signed)
Pain level consistent overnight at 5-7/10 in R lower leg despite pain medication.  C/o burning feeling to R lower leg.  Negative pressure wound vac remains in place with minimal drainage.  Unable to place BLE SCDs per leg dressings.  Patient observed and admitted to having periods of confusion, racing thoughts, crying, general discomfort, slight paranoia, feeling distraught and not knowing "how all this has happened."

## 2017-12-06 NOTE — Progress Notes (Signed)
Patient asks if COBAN on legs can be loosened up per c/o tightmenss and squeezing legs.

## 2017-12-07 MED ORDER — WHITE PETROLATUM EX OINT
TOPICAL_OINTMENT | CUTANEOUS | Status: AC
  Administered 2017-12-07: 1
  Filled 2017-12-07: qty 28.35

## 2017-12-07 NOTE — Progress Notes (Signed)
CSW following for discharge plan. CSW attempted to meet with patient to discuss bed offers, but patient was tearful, appeared confused, and was crying out to God for help. CSW will follow up or contact patient's spouse to discuss bed offers for SNF.  Laveda Abbe, [place] Clinical Social Worker [phone]

## 2017-12-07 NOTE — Progress Notes (Signed)
Pt awoke confused about her surgery that was done on 12/05/17. Pt started to get emotional and had some racing thoughts telling this RN that she feels like she is being left alone and nobody cares about her. RN offered self and provided active listening to the patient. Pt told RN that she needed to talk to a social worker so she can have resources to contact her church if "things won't go right" for her. RN told pt that there is available chaplain that she can talk to in this time but she refused and would rather talk to someone from their church. Nursing will continue to monitor pt. Pt resting as of now.

## 2017-12-08 ENCOUNTER — Encounter (HOSPITAL_COMMUNITY): Payer: Self-pay | Admitting: Orthopedic Surgery

## 2017-12-08 DIAGNOSIS — T8140XA Infection following a procedure, unspecified, initial encounter: Secondary | ICD-10-CM | POA: Diagnosis not present

## 2017-12-08 DIAGNOSIS — T148XXA Other injury of unspecified body region, initial encounter: Secondary | ICD-10-CM | POA: Diagnosis not present

## 2017-12-08 DIAGNOSIS — S81001D Unspecified open wound, right knee, subsequent encounter: Secondary | ICD-10-CM | POA: Diagnosis not present

## 2017-12-08 DIAGNOSIS — Z4789 Encounter for other orthopedic aftercare: Secondary | ICD-10-CM | POA: Diagnosis not present

## 2017-12-08 DIAGNOSIS — S81801S Unspecified open wound, right lower leg, sequela: Secondary | ICD-10-CM | POA: Diagnosis not present

## 2017-12-08 DIAGNOSIS — Z9181 History of falling: Secondary | ICD-10-CM | POA: Diagnosis not present

## 2017-12-08 DIAGNOSIS — I739 Peripheral vascular disease, unspecified: Secondary | ICD-10-CM | POA: Diagnosis not present

## 2017-12-08 DIAGNOSIS — M7989 Other specified soft tissue disorders: Secondary | ICD-10-CM | POA: Diagnosis not present

## 2017-12-08 DIAGNOSIS — I70202 Unspecified atherosclerosis of native arteries of extremities, left leg: Secondary | ICD-10-CM | POA: Diagnosis present

## 2017-12-08 DIAGNOSIS — L97829 Non-pressure chronic ulcer of other part of left lower leg with unspecified severity: Secondary | ICD-10-CM | POA: Diagnosis present

## 2017-12-08 DIAGNOSIS — E039 Hypothyroidism, unspecified: Secondary | ICD-10-CM | POA: Diagnosis present

## 2017-12-08 DIAGNOSIS — I878 Other specified disorders of veins: Secondary | ICD-10-CM | POA: Diagnosis present

## 2017-12-08 DIAGNOSIS — N183 Chronic kidney disease, stage 3 (moderate): Secondary | ICD-10-CM | POA: Diagnosis not present

## 2017-12-08 DIAGNOSIS — D509 Iron deficiency anemia, unspecified: Secondary | ICD-10-CM | POA: Diagnosis not present

## 2017-12-08 DIAGNOSIS — R262 Difficulty in walking, not elsewhere classified: Secondary | ICD-10-CM | POA: Diagnosis not present

## 2017-12-08 DIAGNOSIS — L8992 Pressure ulcer of unspecified site, stage 2: Secondary | ICD-10-CM | POA: Diagnosis not present

## 2017-12-08 DIAGNOSIS — M25561 Pain in right knee: Secondary | ICD-10-CM | POA: Diagnosis not present

## 2017-12-08 DIAGNOSIS — Z961 Presence of intraocular lens: Secondary | ICD-10-CM | POA: Diagnosis present

## 2017-12-08 DIAGNOSIS — D649 Anemia, unspecified: Secondary | ICD-10-CM | POA: Diagnosis not present

## 2017-12-08 DIAGNOSIS — I129 Hypertensive chronic kidney disease with stage 1 through stage 4 chronic kidney disease, or unspecified chronic kidney disease: Secondary | ICD-10-CM | POA: Diagnosis present

## 2017-12-08 DIAGNOSIS — A419 Sepsis, unspecified organism: Secondary | ICD-10-CM | POA: Diagnosis not present

## 2017-12-08 DIAGNOSIS — M79606 Pain in leg, unspecified: Secondary | ICD-10-CM | POA: Diagnosis not present

## 2017-12-08 DIAGNOSIS — R488 Other symbolic dysfunctions: Secondary | ICD-10-CM | POA: Diagnosis not present

## 2017-12-08 DIAGNOSIS — I10 Essential (primary) hypertension: Secondary | ICD-10-CM | POA: Diagnosis not present

## 2017-12-08 DIAGNOSIS — M79604 Pain in right leg: Secondary | ICD-10-CM | POA: Diagnosis not present

## 2017-12-08 DIAGNOSIS — M1990 Unspecified osteoarthritis, unspecified site: Secondary | ICD-10-CM | POA: Diagnosis present

## 2017-12-08 DIAGNOSIS — G546 Phantom limb syndrome with pain: Secondary | ICD-10-CM | POA: Diagnosis not present

## 2017-12-08 DIAGNOSIS — R0600 Dyspnea, unspecified: Secondary | ICD-10-CM | POA: Diagnosis not present

## 2017-12-08 DIAGNOSIS — L97819 Non-pressure chronic ulcer of other part of right lower leg with unspecified severity: Secondary | ICD-10-CM | POA: Diagnosis present

## 2017-12-08 DIAGNOSIS — R269 Unspecified abnormalities of gait and mobility: Secondary | ICD-10-CM | POA: Diagnosis present

## 2017-12-08 DIAGNOSIS — M6281 Muscle weakness (generalized): Secondary | ICD-10-CM | POA: Diagnosis not present

## 2017-12-08 DIAGNOSIS — B999 Unspecified infectious disease: Secondary | ICD-10-CM | POA: Diagnosis not present

## 2017-12-08 DIAGNOSIS — A408 Other streptococcal sepsis: Secondary | ICD-10-CM | POA: Diagnosis not present

## 2017-12-08 DIAGNOSIS — D62 Acute posthemorrhagic anemia: Secondary | ICD-10-CM | POA: Diagnosis not present

## 2017-12-08 DIAGNOSIS — I96 Gangrene, not elsewhere classified: Secondary | ICD-10-CM | POA: Diagnosis present

## 2017-12-08 DIAGNOSIS — L97813 Non-pressure chronic ulcer of other part of right lower leg with necrosis of muscle: Secondary | ICD-10-CM | POA: Diagnosis not present

## 2017-12-08 DIAGNOSIS — S81001S Unspecified open wound, right knee, sequela: Secondary | ICD-10-CM | POA: Diagnosis not present

## 2017-12-08 DIAGNOSIS — K5900 Constipation, unspecified: Secondary | ICD-10-CM | POA: Diagnosis present

## 2017-12-08 DIAGNOSIS — Z6841 Body Mass Index (BMI) 40.0 and over, adult: Secondary | ICD-10-CM | POA: Diagnosis not present

## 2017-12-08 DIAGNOSIS — E43 Unspecified severe protein-calorie malnutrition: Secondary | ICD-10-CM | POA: Diagnosis present

## 2017-12-08 DIAGNOSIS — M79609 Pain in unspecified limb: Secondary | ICD-10-CM | POA: Diagnosis not present

## 2017-12-08 DIAGNOSIS — E871 Hypo-osmolality and hyponatremia: Secondary | ICD-10-CM | POA: Diagnosis present

## 2017-12-08 DIAGNOSIS — L0390 Cellulitis, unspecified: Secondary | ICD-10-CM | POA: Diagnosis not present

## 2017-12-08 DIAGNOSIS — K219 Gastro-esophageal reflux disease without esophagitis: Secondary | ICD-10-CM | POA: Diagnosis present

## 2017-12-08 DIAGNOSIS — S81839A Puncture wound without foreign body, unspecified lower leg, initial encounter: Secondary | ICD-10-CM | POA: Diagnosis not present

## 2017-12-08 DIAGNOSIS — D508 Other iron deficiency anemias: Secondary | ICD-10-CM | POA: Diagnosis not present

## 2017-12-08 DIAGNOSIS — E876 Hypokalemia: Secondary | ICD-10-CM | POA: Diagnosis present

## 2017-12-08 DIAGNOSIS — L03116 Cellulitis of left lower limb: Secondary | ICD-10-CM | POA: Diagnosis not present

## 2017-12-08 DIAGNOSIS — E034 Atrophy of thyroid (acquired): Secondary | ICD-10-CM | POA: Diagnosis not present

## 2017-12-08 DIAGNOSIS — R52 Pain, unspecified: Secondary | ICD-10-CM | POA: Diagnosis not present

## 2017-12-08 DIAGNOSIS — L97812 Non-pressure chronic ulcer of other part of right lower leg with fat layer exposed: Secondary | ICD-10-CM | POA: Diagnosis not present

## 2017-12-08 DIAGNOSIS — M810 Age-related osteoporosis without current pathological fracture: Secondary | ICD-10-CM | POA: Diagnosis not present

## 2017-12-08 DIAGNOSIS — L03115 Cellulitis of right lower limb: Secondary | ICD-10-CM | POA: Diagnosis not present

## 2017-12-08 DIAGNOSIS — M797 Fibromyalgia: Secondary | ICD-10-CM | POA: Diagnosis present

## 2017-12-08 LAB — GLUCOSE, CAPILLARY: Glucose-Capillary: 88 mg/dL (ref 65–99)

## 2017-12-08 MED ORDER — INFLUENZA VAC SPLIT HIGH-DOSE 0.5 ML IM SUSY: 0.5000 mL | PREFILLED_SYRINGE | INTRAMUSCULAR | 0 refills | Status: AC

## 2017-12-08 MED ORDER — OXYCODONE-ACETAMINOPHEN 5-325 MG PO TABS: 1.0000 | ORAL_TABLET | ORAL | 0 refills | Status: DC | PRN

## 2017-12-08 NOTE — Discharge Summary (Signed)
Discharge Diagnoses:  Active Problems:   Skin ulcer of knee, right, with fat layer exposed ([place])   Open wound of right knee   Surgeries: Procedure(s): SKIN GRAFT SPLIT THICKNESS WOUND KNEE, APPLY VAC on 12/05/2017    Discharged Condition: Improved  Hospital Course: Shelby Reese is an 83 y.o. female who was admitted 11/28/2017 with a chief complaint of No chief complaint on file. , and found to have a diagnosis of wound knee.  They were brought to the operating room on 12/05/2017 and underwent Procedure(s): SKIN GRAFT SPLIT THICKNESS WOUND KNEE, APPLY VAC.    They were given perioperative antibiotics:            Anti-infectives (From admission, onward)   Start     Dose/Rate Route Frequency Ordered Stop   12/05/17 1330  clindamycin (CLEOCIN) IVPB 600 mg     600 mg 100 mL/hr over 30 Minutes Intravenous Every 6 hours 12/05/17 1032 12/06/17 0440   12/03/17 1930  clindamycin (CLEOCIN) IVPB 600 mg  Status:  Discontinued     600 mg 100 mL/hr over 30 Minutes Intravenous Every 6 hours 12/03/17 1602 12/05/17 1051   12/03/17 1145  clindamycin (CLEOCIN) IVPB 900 mg     900 mg 100 mL/hr over 30 Minutes Intravenous On call to O.R. 12/02/17 1512 12/03/17 1354   11/28/17 1600  clindamycin (CLEOCIN) IVPB 600 mg     600 mg 100 mL/hr over 30 Minutes Intravenous Every 6 hours 11/28/17 1306 11/29/17 0354   11/28/17 0930  clindamycin (CLEOCIN) IVPB 900 mg     900 mg 100 mL/hr over 30 Minutes Intravenous On call to O.R. 11/28/17 0915 11/28/17 1056   11/28/17 0925  clindamycin (CLEOCIN) 900 MG/50ML IVPB    Comments:  Connye Burkitt   : cabinet override      11/28/17 0925 11/28/17 1056    .  They were given sequential compression devices, early ambulation, and Other (comment) for DVT prophylaxis. Taking ASA.  Recent vital signs:  Patient Vitals for the past 24 hrs:  BP Temp Temp src Pulse Resp SpO2  12/08/17 0809 (!) 145/56 - - - - -  12/08/17 0337 (!) 145/56 98.1 F (36.7  C) Oral 83 18 95 %  12/07/17 2052 (!) 133/46 98.5 F (36.9 C) Oral 81 16 97 %  12/07/17 1335 (!) 154/52 98.6 F (37 C) Oral 85 18 95 %  .  Recent laboratory studies:  ImagingResults(Last48hours)  No results found.    Discharge Medications:       Allergies as of 12/08/2017      Reactions   Cinnamon Hives   Ciprofloxacin Other (See Comments)   TREMORS   Diovan [valsartan] Other (See Comments)   Extreme vertigo   Food Diarrhea, Other (See Comments)   ORANGE JUICE   UPSET STOMACH   Latex Rash, Other (See Comments)   Rash/inflammation due to exposure   Nitrofuran Derivatives Hives, Rash   "Full body rash"   Penicillins Hives, Swelling   *tolerated Ceftriaxone September 2018 Has patient had a PCN reaction causing immediate rash, facial/tongue/throat swelling, SOB or lightheadedness with hypotension:No--severe irritation at the injection site Has patient had a PCN reaction causing severe rash involving mucus membranes or skin necrosis:Unknown Has patient had a PCN reaction that required hospitalization:No Has patient had a PCN reaction occurring within the last 10 years:Yes If all of the above answers are "NO", then may proceed with   Bactrim [sulfamethoxazole-trimethoprim] Diarrhea, Nausea Only   Other Rash  Mycins   Sulfa Antibiotics Diarrhea, Nausea Only              Medication List     STOP taking these medications   doxycycline 100 MG tablet Commonly known as:  VIBRA-TABS   HYDROcodone-acetaminophen 5-325 MG tablet Commonly known as:  NORCO/VICODIN     TAKE these medications   acetaminophen 500 MG tablet Commonly known as:  TYLENOL Take 1,000 mg by mouth daily.   aspirin EC 81 MG tablet Take 81 mg by mouth daily with breakfast.   benazepril 40 MG tablet Commonly known as:  LOTENSIN Take 40 mg by mouth daily with breakfast.   CALCIUM 600+D 600-400 MG-UNIT tablet Generic drug:  Calcium Carbonate-Vitamin D Take 1 tablet by  mouth daily at 3 pm.   cetirizine 10 MG tablet Commonly known as:  ZYRTEC Take 10 mg by mouth at bedtime.   diltiazem 120 MG 24 hr capsule Commonly known as:  TIAZAC Take 120 mg by mouth daily with breakfast.   diphenhydrAMINE 25 mg capsule Commonly known as:  BENADRYL Take 50 mg by mouth every 6 (six) hours.   famotidine 40 MG tablet Commonly known as:  PEPCID Take 40 mg by mouth daily at 3 pm. 1600   ferrous sulfate 325 (65 FE) MG tablet Take 1 tablet (325 mg total) by mouth 2 (two) times daily with a meal. What changed:  when to take this   Gauze Pads & Dressings 4"X4-1/2" Pads 2 Units by Does not apply route 4 (four) times daily.   KERLIX GAUZE ROLL LARGE Misc 1 Units by Does not apply route 4 (four) times daily.   hydrALAZINE 25 MG tablet Commonly known as:  APRESOLINE Take 1 tablet (25 mg total) by mouth every 8 (eight) hours.   hydrocortisone cream 0.5 % Apply topically 3 (three) times daily.   ibuprofen 200 MG tablet Commonly known as:  ADVIL,MOTRIN Take 400 mg by mouth daily.   Influenza vac split quadrivalent PF 0.5 ML injection Commonly known as:  FLUZONE HIGH-DOSE Inject 0.5 mLs into the muscle tomorrow at 10 am for 1 dose.   MAG-200 PO Take 200 mg by mouth daily at 3 pm.   multivitamin with minerals Tabs tablet Take 1 tablet by mouth daily.   NATURAL PSYLLIUM FIBER PO Take 1 capsule by mouth daily.   oxyCODONE-acetaminophen 5-325 MG tablet Commonly known as:  PERCOCET/ROXICET Take 1 tablet by mouth every 4 (four) hours as needed for severe pain. What changed:  when to take this   predniSONE 20 MG tablet Commonly known as:  DELTASONE Take 2 tablets (40 mg total) by mouth daily with breakfast. 40 mg on 10/17, 20 mg on 10/18, 10 mg on 10/19 and then 5mg  on 10/20   silver sulfADIAZINE 1 % cream Commonly known as:  SILVADENE Apply 1 application topically daily.   SYNTHROID 175 MCG tablet Generic drug:  levothyroxine Take 175  mcg by mouth daily before breakfast.   vitamin C 500 MG tablet Commonly known as:  ASCORBIC ACID Take 500 mg by mouth daily.       Diagnostic Studies:  ImagingResults  Dg Tibia/fibula Right  Result Date: 11/12/2017 CLINICAL DATA:  Fall, pain.  Swelling and bruising above left eye. EXAM: RIGHT TIBIA AND FIBULA - 2 VIEW COMPARISON:  None. FINDINGS: There is no evidence of fracture or other focal bone lesions. Soft tissues are unremarkable. IMPRESSION: No fracture or dislocation. Electronically Signed   By: Roxy Horseman.D.  On: 11/12/2017 17:06   Ct Head Wo Contrast  Result Date: 11/12/2017 CLINICAL DATA:  Follow-up possible subdural hemorrhage EXAM: CT HEAD WITHOUT CONTRAST TECHNIQUE: Contiguous axial images were obtained from the base of the skull through the vertex without intravenous contrast. COMPARISON:  11/12/2017 FINDINGS: Brain: No acute territorial infarction or intracranial mass is visualized. No significant change in size or appearance of the subtle, thin 1 mm hyperdensity along the left parietal temporal convexity. Atrophy and mild small vessel ischemic changes. Stable ventricle size. Vascular: No hyperdense vessels.  Carotid artery calcification. Skull: No depressed skull fracture Sinuses/Orbits: Left frontal sinus opacification. Mild mucosal thickening in the ethmoid and maxillary sinuses. Other: Left forehead and periorbital hematoma. Small right frontal parietal scalp swelling. IMPRESSION: No significant interval change in size or appearance of the subtle thin 1 mm hyperdensity along the left parietal temporal convexity, suspected to represent trace subdural blood. No new abnormalities are evident. Electronically Signed   By: Donavan Foil M.D.   On: 11/12/2017 21:01   Ct Head Wo Contrast  Result Date: 11/12/2017 CLINICAL DATA:  Hit head EXAM: CT HEAD WITHOUT CONTRAST CT CERVICAL SPINE WITHOUT CONTRAST TECHNIQUE: Multidetector CT imaging of the head and cervical  spine was performed following the standard protocol without intravenous contrast. Multiplanar CT image reconstructions of the cervical spine were also generated. COMPARISON:  06/28/2015 FINDINGS: CT HEAD FINDINGS Brain: No acute territorial infarction, or intracranial mass is visualized. Suspect trace left temporal, parietal convexity sub dural hematoma, 1 mm thick, series 6, image number 37 through 41. No significant mass effect or midline shift. Moderate atrophy. Mild small vessel ischemic changes of the white matter. Stable ventricle size. Vascular: No hyperdense vessels.  Carotid artery calcification. Skull: No depressed skull fracture. Sinuses/Orbits: Opacified left frontal sinus. Mild mucosal thickening in the ethmoid and maxillary sinuses. No acute orbital abnormality. Other: Large left forehead and supraorbital soft tissue hematoma. Smaller right frontal/parietal scalp hematoma. CT CERVICAL SPINE FINDINGS Alignment: Trace retrolisthesis of C4 on C5. Facet alignment is within normal limits. Skull base and vertebrae: No acute fracture. No primary bone lesion or focal pathologic process. Soft tissues and spinal canal: No prevertebral fluid or swelling. No visible canal hematoma. Disc levels: Moderate severe degenerative changes at C4-C5, C5-C6 and C6-C7. Multiple level bilateral facet arthropathy. Upper chest: Lung apices are clear. Carotid artery calcification. 2.1 cm low-density mass in the left lobe of the thyroid. Other: None IMPRESSION: 1. Findings suspicious for trace, 1 mm thick left convexity subdural hematoma. No midline shift or mass effect. 2. Large left forehead soft tissue hematoma and smaller right frontal/parietal scalp hematoma. 3. Atrophy and mild small vessel ischemic changes of the white matter 4. Trace retrolisthesis of C4 on C5, likely degenerative. No fracture seen. Critical Value/emergent results were called by telephone at the time of interpretation on 11/12/2017 at 5:07 pm to Dr. Jola Schmidt , who verbally acknowledged these results. Electronically Signed   By: Donavan Foil M.D.   On: 11/12/2017 17:07   Ct Cervical Spine Wo Contrast  Result Date: 11/12/2017 CLINICAL DATA:  Hit head EXAM: CT HEAD WITHOUT CONTRAST CT CERVICAL SPINE WITHOUT CONTRAST TECHNIQUE: Multidetector CT imaging of the head and cervical spine was performed following the standard protocol without intravenous contrast. Multiplanar CT image reconstructions of the cervical spine were also generated. COMPARISON:  06/28/2015 FINDINGS: CT HEAD FINDINGS Brain: No acute territorial infarction, or intracranial mass is visualized. Suspect trace left temporal, parietal convexity sub dural hematoma, 1 mm thick, series 6,  image number 37 through 41. No significant mass effect or midline shift. Moderate atrophy. Mild small vessel ischemic changes of the white matter. Stable ventricle size. Vascular: No hyperdense vessels.  Carotid artery calcification. Skull: No depressed skull fracture. Sinuses/Orbits: Opacified left frontal sinus. Mild mucosal thickening in the ethmoid and maxillary sinuses. No acute orbital abnormality. Other: Large left forehead and supraorbital soft tissue hematoma. Smaller right frontal/parietal scalp hematoma. CT CERVICAL SPINE FINDINGS Alignment: Trace retrolisthesis of C4 on C5. Facet alignment is within normal limits. Skull base and vertebrae: No acute fracture. No primary bone lesion or focal pathologic process. Soft tissues and spinal canal: No prevertebral fluid or swelling. No visible canal hematoma. Disc levels: Moderate severe degenerative changes at C4-C5, C5-C6 and C6-C7. Multiple level bilateral facet arthropathy. Upper chest: Lung apices are clear. Carotid artery calcification. 2.1 cm low-density mass in the left lobe of the thyroid. Other: None IMPRESSION: 1. Findings suspicious for trace, 1 mm thick left convexity subdural hematoma. No midline shift or mass effect. 2. Large left forehead soft  tissue hematoma and smaller right frontal/parietal scalp hematoma. 3. Atrophy and mild small vessel ischemic changes of the white matter 4. Trace retrolisthesis of C4 on C5, likely degenerative. No fracture seen. Critical Value/emergent results were called by telephone at the time of interpretation on 11/12/2017 at 5:07 pm to Dr. Jola Schmidt , who verbally acknowledged these results. Electronically Signed   By: Donavan Foil M.D.   On: 11/12/2017 17:07     They benefited maximally from their hospital stay and there were no complications.     Disposition: Discharge to skilled nursing.    Follow-up Information    [name], MD In 1 week.   Specialty:  Orthopedic Surgery Contact information: [place] [address] [phone]            Signed: Suzan Slick 12/08/2017, 8:48 AM

## 2017-12-08 NOTE — Progress Notes (Signed)
Physical Therapy Treatment Patient Details Name: Shelby Reese MRN: [id number] DOB: [birth year] Today's Date: 12/08/2017    History of Present Illness 83yo female who had limb salvage intervention with I&D and wound vac placement, however at home she was non compliant with wound care nurse and recommendations. She received repeat I&D to knee wound with vac placement 11/28/17. PMH anemia, CKD, fibromyalgia, GERD, hernia, HTN, hypothyroidism, lymphedema, OA, PVD, knee ulcer, venous insufficiency, hx multiople I&Ds with vacs     PT Comments    Patient required min/mod A +2 for OOB mobility. Pt limited by pain in bilat LE (R > L). Continue to progress as tolerated with anticipated d/c to SNF for further skilled PT services.     Follow Up Recommendations  SNF     Equipment Recommendations  None recommended by PT    Recommendations for Other Services       Precautions / Restrictions Precautions Precautions: Fall Precaution Comments: wound vac  Restrictions Weight Bearing Restrictions: Yes RLE Weight Bearing: Weight bearing as tolerated LLE Weight Bearing: Weight bearing as tolerated    Mobility  Bed Mobility Overal bed mobility: Needs Assistance Bed Mobility: Supine to Sit     Supine to sit: Mod assist     General bed mobility comments: assist to scoot hips and elevate trunk into sitting; use of bed rails and HOB raised  Transfers Overall transfer level: Needs assistance Equipment used: Rolling walker (2 wheeled) Transfers: Sit to/from [facility] Sit to Stand: Mod assist;+2 physical assistance Stand pivot transfers: Min assist;+2 safety/equipment       General transfer comment: cues for safe hand placement, technique, and posture; assist to power up into standing and for balance upon standing; pt is unable to put feet in optimal position to stand and needs assist to push RW forward and shift weight anteriorly upon standing  Ambulation/Gait                  Stairs            Wheelchair Mobility    Modified Rankin (Stroke Patients Only)       Balance Overall balance assessment: Needs assistance;History of Falls Sitting-balance support: Bilateral upper extremity supported;Feet supported Sitting balance-Leahy Scale: Good     Standing balance support: Bilateral upper extremity supported;During functional activity Standing balance-Leahy Scale: Poor Standing balance comment: reliant on RW                             Cognition Arousal/Alertness: Awake/alert Behavior During Therapy: Flat affect Overall Cognitive Status: No family/caregiver present to determine baseline cognitive functioning                                        Exercises      General Comments General comments (skin integrity, edema, etc.): drainage from R gastroc through bandaging      Pertinent Vitals/Pain Pain Assessment: Faces Faces Pain Scale: Hurts whole lot Pain Location: LE wound sites, bilat posterior gastroc (R>L) Pain Descriptors / Indicators: Sharp;Sore;Grimacing;Guarding;Moaning Pain Intervention(s): Limited activity within patient's tolerance;Monitored during session;Premedicated before session;Repositioned    Home Living                      Prior Function            PT Goals (current goals can now  be found in the care plan section) Acute Rehab PT Goals Patient Stated Goal: to get well  PT Goal Formulation: With patient Time For Goal Achievement: 12/12/17 Potential to Achieve Goals: Good Progress towards PT goals: Progressing toward goals    Frequency    Min 2X/week      PT Plan Current plan remains appropriate    Co-evaluation              AM-PAC PT "6 Clicks" Daily Activity  Outcome Measure  Difficulty turning over in bed (including adjusting bedclothes, sheets and blankets)?: Unable Difficulty moving from lying on back to sitting on the side of the bed? :  Unable Difficulty sitting down on and standing up from a chair with arms (e.g., wheelchair, bedside commode, etc,.)?: Unable Help needed moving to and from a bed to chair (including a wheelchair)?: A Lot Help needed walking in hospital room?: A Lot Help needed climbing 3-5 steps with a railing? : Total 6 Click Score: 8    End of Session Equipment Utilized During Treatment: Gait belt Activity Tolerance: Patient tolerated treatment well Patient left: in chair;with call bell/phone within reach Nurse Communication: Mobility status PT Visit Diagnosis: Unsteadiness on feet (R26.81);Muscle weakness (generalized) (M62.81);Difficulty in walking, not elsewhere classified (R26.2);History of falling (Z91.81)     Time: 0174-9449 PT Time Calculation (min) (ACUTE ONLY): 41 min  Charges:  $Therapeutic Activity: 23-37 mins                    G Codes:       Earney Navy, PTA Pager: [phone]     Darliss Cheney 12/08/2017, 2:10 PM

## 2017-12-08 NOTE — Social Work (Addendum)
CSW met with patient at bedside and discussed SNF offers. CSW gave patient time to review SNF bed offers and will f/u shortly as it appears discharging today.  CSW will continue to follow.  11:00am: Discussed with patient SNF bed offers an she has accepted SNF bed offer from [facility]. CSW confirming bed offer.  CSW received call back from spouse and CSW discussed SNF placement.  CSW will continue to follow.  Elissa Hefty, LCSW Clinical Social Worker [phone]

## 2017-12-08 NOTE — Progress Notes (Signed)
Spoke with Dondra Prader in regards to discharge summary.  Made aware of the patient being discharged to [facility]

## 2017-12-08 NOTE — Progress Notes (Signed)
[facility] notified of the patient's planned admission to the facility today.  Patient's husband is also aware.  Report was given to Olu.

## 2017-12-08 NOTE — Clinical Social Work Placement (Signed)
   CLINICAL SOCIAL WORK PLACEMENT  NOTE  Date:  12/08/2017  Patient Details  Name: Shelby Reese MRN: [id number] Date of Birth: 1935/03/15  Clinical Social Work is seeking post-discharge placement for this patient at the [place] level of care (*CSW will initial, date and re-position this form in  chart as items are completed):  Yes   Patient/family provided with [place] Work Department's list of facilities offering this level of care within the geographic area requested by the patient (or if unable, by the patient's family).  Yes   Patient/family informed of their freedom to choose among providers that offer the needed level of care, that participate in Medicare, Medicaid or managed care program needed by the patient, have an available bed and are willing to accept the patient.  Yes   Patient/family informed of 's ownership interest in [facility] and [facility], as well as of the fact that they are under no obligation to receive care at these facilities.  PASRR submitted to EDS on       PASRR number received on       Existing PASRR number confirmed on 11/29/17     FL2 transmitted to all facilities in geographic area requested by pt/family on       FL2 transmitted to all facilities within larger geographic area on 11/29/17     Patient informed that his/her managed care company has contracts with or will negotiate with certain facilities, including the following:        Yes   Patient/family informed of bed offers received.  Patient chooses bed at [facility]     Physician recommends and patient chooses bed at      Patient to be transferred to [facility] on 12/08/17.  Patient to be transferred to facility by [facility]     Patient family notified on 12/08/17 of transfer.  Name of family member notified:  spouse advised      PHYSICIAN       Additional Comment:     _______________________________________________ Normajean Baxter, LCSW 12/08/2017, 11:28 AM

## 2017-12-08 NOTE — Discharge Planning (Addendum)
Discharge Diagnoses:  Active Problems:   Skin ulcer of knee, right, with fat layer exposed ([place])   Open wound of right knee   Surgeries: Procedure(s): SKIN GRAFT SPLIT THICKNESS WOUND KNEE, APPLY VAC on 12/05/2017    Discharged Condition: Improved  Hospital Course: Shelby Reese is an 83 y.o. female who was admitted 11/28/2017 with a chief complaint of No chief complaint on file. , and found to have a diagnosis of wound knee.  They were brought to the operating room on 12/05/2017 and underwent Procedure(s): SKIN GRAFT SPLIT THICKNESS WOUND KNEE, APPLY VAC.    They were given perioperative antibiotics:  Anti-infectives (From admission, onward)   Start     Dose/Rate Route Frequency Ordered Stop   12/05/17 1330  clindamycin (CLEOCIN) IVPB 600 mg     600 mg 100 mL/hr over 30 Minutes Intravenous Every 6 hours 12/05/17 1032 12/06/17 0440   12/03/17 1930  clindamycin (CLEOCIN) IVPB 600 mg  Status:  Discontinued     600 mg 100 mL/hr over 30 Minutes Intravenous Every 6 hours 12/03/17 1602 12/05/17 1051   12/03/17 1145  clindamycin (CLEOCIN) IVPB 900 mg     900 mg 100 mL/hr over 30 Minutes Intravenous On call to O.R. 12/02/17 1512 12/03/17 1354   11/28/17 1600  clindamycin (CLEOCIN) IVPB 600 mg     600 mg 100 mL/hr over 30 Minutes Intravenous Every 6 hours 11/28/17 1306 11/29/17 0354   11/28/17 0930  clindamycin (CLEOCIN) IVPB 900 mg     900 mg 100 mL/hr over 30 Minutes Intravenous On call to O.R. 11/28/17 0915 11/28/17 1056   11/28/17 0925  clindamycin (CLEOCIN) 900 MG/50ML IVPB    Comments:  Connye Burkitt   : cabinet override      11/28/17 0925 11/28/17 1056    .  They were given sequential compression devices, early ambulation, and Other (comment) for DVT prophylaxis. Taking ASA.  Recent vital signs:  Patient Vitals for the past 24 hrs:  BP Temp Temp src Pulse Resp SpO2  12/08/17 0809 (!) 145/56 - - - - -  12/08/17 0337 (!) 145/56 98.1 F (36.7 C) Oral 83 18 95 %  12/07/17 2052  (!) 133/46 98.5 F (36.9 C) Oral 81 16 97 %  12/07/17 1335 (!) 154/52 98.6 F (37 C) Oral 85 18 95 %  .  Recent laboratory studies: No results found.  Discharge Medications:   Allergies as of 12/08/2017      Reactions   Cinnamon Hives   Ciprofloxacin Other (See Comments)   TREMORS   Diovan [valsartan] Other (See Comments)   Extreme vertigo   Food Diarrhea, Other (See Comments)   ORANGE JUICE   UPSET STOMACH   Latex Rash, Other (See Comments)   Rash/inflammation due to exposure   Nitrofuran Derivatives Hives, Rash   "Full body rash"   Penicillins Hives, Swelling   *tolerated Ceftriaxone September 2018 Has patient had a PCN reaction causing immediate rash, facial/tongue/throat swelling, SOB or lightheadedness with hypotension:No--severe irritation at the injection site Has patient had a PCN reaction causing severe rash involving mucus membranes or skin necrosis:Unknown Has patient had a PCN reaction that required hospitalization:No Has patient had a PCN reaction occurring within the last 10 years:Yes If all of the above answers are "NO", then may proceed with   Bactrim [sulfamethoxazole-trimethoprim] Diarrhea, Nausea Only   Other Rash   Mycins   Sulfa Antibiotics Diarrhea, Nausea Only      Medication List    STOP  taking these medications   doxycycline 100 MG tablet Commonly known as:  VIBRA-TABS   HYDROcodone-acetaminophen 5-325 MG tablet Commonly known as:  NORCO/VICODIN     TAKE these medications   acetaminophen 500 MG tablet Commonly known as:  TYLENOL Take 1,000 mg by mouth daily.   aspirin EC 81 MG tablet Take 81 mg by mouth daily with breakfast.   benazepril 40 MG tablet Commonly known as:  LOTENSIN Take 40 mg by mouth daily with breakfast.   CALCIUM 600+D 600-400 MG-UNIT tablet Generic drug:  Calcium Carbonate-Vitamin D Take 1 tablet by mouth daily at 3 pm.   cetirizine 10 MG tablet Commonly known as:  ZYRTEC Take 10 mg by mouth at bedtime.    diltiazem 120 MG 24 hr capsule Commonly known as:  TIAZAC Take 120 mg by mouth daily with breakfast.   diphenhydrAMINE 25 mg capsule Commonly known as:  BENADRYL Take 50 mg by mouth every 6 (six) hours.   famotidine 40 MG tablet Commonly known as:  PEPCID Take 40 mg by mouth daily at 3 pm. 1600   ferrous sulfate 325 (65 FE) MG tablet Take 1 tablet (325 mg total) by mouth 2 (two) times daily with a meal. What changed:  when to take this   Gauze Pads & Dressings 4"X4-1/2" Pads 2 Units by Does not apply route 4 (four) times daily.   KERLIX GAUZE ROLL LARGE Misc 1 Units by Does not apply route 4 (four) times daily.   hydrALAZINE 25 MG tablet Commonly known as:  APRESOLINE Take 1 tablet (25 mg total) by mouth every 8 (eight) hours.   hydrocortisone cream 0.5 % Apply topically 3 (three) times daily.   ibuprofen 200 MG tablet Commonly known as:  ADVIL,MOTRIN Take 400 mg by mouth daily.   Influenza vac split quadrivalent PF 0.5 ML injection Commonly known as:  FLUZONE HIGH-DOSE Inject 0.5 mLs into the muscle tomorrow at 10 am for 1 dose.   MAG-200 PO Take 200 mg by mouth daily at 3 pm.   multivitamin with minerals Tabs tablet Take 1 tablet by mouth daily.   NATURAL PSYLLIUM FIBER PO Take 1 capsule by mouth daily.   oxyCODONE-acetaminophen 5-325 MG tablet Commonly known as:  PERCOCET/ROXICET Take 1 tablet by mouth every 4 (four) hours as needed for severe pain. What changed:  when to take this   predniSONE 20 MG tablet Commonly known as:  DELTASONE Take 2 tablets (40 mg total) by mouth daily with breakfast. 40 mg on 10/17, 20 mg on 10/18, 10 mg on 10/19 and then 5mg  on 10/20   silver sulfADIAZINE 1 % cream Commonly known as:  SILVADENE Apply 1 application topically daily.   SYNTHROID 175 MCG tablet Generic drug:  levothyroxine Take 175 mcg by mouth daily before breakfast.   vitamin C 500 MG tablet Commonly known as:  ASCORBIC ACID Take 500 mg by mouth  daily.       Diagnostic Studies: Dg Tibia/fibula Right  Result Date: 11/12/2017 CLINICAL DATA:  Fall, pain.  Swelling and bruising above left eye. EXAM: RIGHT TIBIA AND FIBULA - 2 VIEW COMPARISON:  None. FINDINGS: There is no evidence of fracture or other focal bone lesions. Soft tissues are unremarkable. IMPRESSION: No fracture or dislocation. Electronically Signed   By: Franki Cabot M.D.   On: 11/12/2017 17:06   Ct Head Wo Contrast  Result Date: 11/12/2017 CLINICAL DATA:  Follow-up possible subdural hemorrhage EXAM: CT HEAD WITHOUT CONTRAST TECHNIQUE: Contiguous axial images were obtained  from the base of the skull through the vertex without intravenous contrast. COMPARISON:  11/12/2017 FINDINGS: Brain: No acute territorial infarction or intracranial mass is visualized. No significant change in size or appearance of the subtle, thin 1 mm hyperdensity along the left parietal temporal convexity. Atrophy and mild small vessel ischemic changes. Stable ventricle size. Vascular: No hyperdense vessels.  Carotid artery calcification. Skull: No depressed skull fracture Sinuses/Orbits: Left frontal sinus opacification. Mild mucosal thickening in the ethmoid and maxillary sinuses. Other: Left forehead and periorbital hematoma. Small right frontal parietal scalp swelling. IMPRESSION: No significant interval change in size or appearance of the subtle thin 1 mm hyperdensity along the left parietal temporal convexity, suspected to represent trace subdural blood. No new abnormalities are evident. Electronically Signed   By: Donavan Foil M.D.   On: 11/12/2017 21:01   Ct Head Wo Contrast  Result Date: 11/12/2017 CLINICAL DATA:  Hit head EXAM: CT HEAD WITHOUT CONTRAST CT CERVICAL SPINE WITHOUT CONTRAST TECHNIQUE: Multidetector CT imaging of the head and cervical spine was performed following the standard protocol without intravenous contrast. Multiplanar CT image reconstructions of the cervical spine were also  generated. COMPARISON:  06/28/2015 FINDINGS: CT HEAD FINDINGS Brain: No acute territorial infarction, or intracranial mass is visualized. Suspect trace left temporal, parietal convexity sub dural hematoma, 1 mm thick, series 6, image number 37 through 41. No significant mass effect or midline shift. Moderate atrophy. Mild small vessel ischemic changes of the white matter. Stable ventricle size. Vascular: No hyperdense vessels.  Carotid artery calcification. Skull: No depressed skull fracture. Sinuses/Orbits: Opacified left frontal sinus. Mild mucosal thickening in the ethmoid and maxillary sinuses. No acute orbital abnormality. Other: Large left forehead and supraorbital soft tissue hematoma. Smaller right frontal/parietal scalp hematoma. CT CERVICAL SPINE FINDINGS Alignment: Trace retrolisthesis of C4 on C5. Facet alignment is within normal limits. Skull base and vertebrae: No acute fracture. No primary bone lesion or focal pathologic process. Soft tissues and spinal canal: No prevertebral fluid or swelling. No visible canal hematoma. Disc levels: Moderate severe degenerative changes at C4-C5, C5-C6 and C6-C7. Multiple level bilateral facet arthropathy. Upper chest: Lung apices are clear. Carotid artery calcification. 2.1 cm low-density mass in the left lobe of the thyroid. Other: None IMPRESSION: 1. Findings suspicious for trace, 1 mm thick left convexity subdural hematoma. No midline shift or mass effect. 2. Large left forehead soft tissue hematoma and smaller right frontal/parietal scalp hematoma. 3. Atrophy and mild small vessel ischemic changes of the white matter 4. Trace retrolisthesis of C4 on C5, likely degenerative. No fracture seen. Critical Value/emergent results were called by telephone at the time of interpretation on 11/12/2017 at 5:07 pm to Dr. Jola Schmidt , who verbally acknowledged these results. Electronically Signed   By: Donavan Foil M.D.   On: 11/12/2017 17:07   Ct Cervical Spine Wo  Contrast  Result Date: 11/12/2017 CLINICAL DATA:  Hit head EXAM: CT HEAD WITHOUT CONTRAST CT CERVICAL SPINE WITHOUT CONTRAST TECHNIQUE: Multidetector CT imaging of the head and cervical spine was performed following the standard protocol without intravenous contrast. Multiplanar CT image reconstructions of the cervical spine were also generated. COMPARISON:  06/28/2015 FINDINGS: CT HEAD FINDINGS Brain: No acute territorial infarction, or intracranial mass is visualized. Suspect trace left temporal, parietal convexity sub dural hematoma, 1 mm thick, series 6, image number 37 through 41. No significant mass effect or midline shift. Moderate atrophy. Mild small vessel ischemic changes of the white matter. Stable ventricle size. Vascular: No hyperdense vessels.  Carotid artery calcification. Skull: No depressed skull fracture. Sinuses/Orbits: Opacified left frontal sinus. Mild mucosal thickening in the ethmoid and maxillary sinuses. No acute orbital abnormality. Other: Large left forehead and supraorbital soft tissue hematoma. Smaller right frontal/parietal scalp hematoma. CT CERVICAL SPINE FINDINGS Alignment: Trace retrolisthesis of C4 on C5. Facet alignment is within normal limits. Skull base and vertebrae: No acute fracture. No primary bone lesion or focal pathologic process. Soft tissues and spinal canal: No prevertebral fluid or swelling. No visible canal hematoma. Disc levels: Moderate severe degenerative changes at C4-C5, C5-C6 and C6-C7. Multiple level bilateral facet arthropathy. Upper chest: Lung apices are clear. Carotid artery calcification. 2.1 cm low-density mass in the left lobe of the thyroid. Other: None IMPRESSION: 1. Findings suspicious for trace, 1 mm thick left convexity subdural hematoma. No midline shift or mass effect. 2. Large left forehead soft tissue hematoma and smaller right frontal/parietal scalp hematoma. 3. Atrophy and mild small vessel ischemic changes of the white matter 4. Trace  retrolisthesis of C4 on C5, likely degenerative. No fracture seen. Critical Value/emergent results were called by telephone at the time of interpretation on 11/12/2017 at 5:07 pm to Dr. Jola Schmidt , who verbally acknowledged these results. Electronically Signed   By: Donavan Foil M.D.   On: 11/12/2017 17:07    They benefited maximally from their hospital stay and there were no complications.     Disposition: Discharge to skilled nursing.  Follow-up Information    [name], MD In 1 week.   Specialty:  Orthopedic Surgery Contact information: [place] [address] [phone]            Signed: Suzan Slick 12/08/2017, 8:48 AM

## 2017-12-08 NOTE — Progress Notes (Signed)
   Subjective:  Patient is c/o pain in BLEs Tearful, unhappy about compression wraps.   Objective:   VITALS:   Vitals:   12/07/17 1335 12/07/17 2052 12/08/17 0337 12/08/17 0809  BP: (!) 154/52 (!) 133/46 (!) 145/56 (!) 145/56  Pulse: 85 81 83   Resp: 18 16 18    Temp: 98.6 F (37 C) 98.5 F (36.9 C) 98.1 F (36.7 C)   TempSrc: Oral Oral Oral   SpO2: 95% 97% 95%   Height:        Dressings c/d/i VAC intact and draining   Lab Results  Component Value Date   WBC 12.8 (H) 11/28/2017   HGB 8.0 (L) 11/28/2017   HCT 27.5 (L) 11/28/2017   MCV 81.4 11/28/2017   PLT 518 (H) 11/28/2017     Assessment/Plan:  3 Days Post-Op   - patient to d/c to SNF today with prevena - cymbalta added Saturday for peripheral neuropathy, dystrophic pain - compression wraps in place bilaterally, proper fit, are clean and dry  Suzan Slick 12/08/2017, 9:30 AM [phone]

## 2017-12-08 NOTE — Clinical Social Work Placement (Signed)
   CLINICAL SOCIAL WORK PLACEMENT  NOTE  Date:  12/08/2017  Patient Details  Name: Shelby Reese MRN: [id number] Date of Birth: [birth year]  Clinical Social Work is seeking post-discharge placement for this patient at the [place] level of care (*CSW will initial, date and re-position this form in  chart as items are completed):  Yes   Patient/family provided with [facility] Work Department's list of facilities offering this level of care within the geographic area requested by the patient (or if unable, by the patient's family).  Yes   Patient/family informed of their freedom to choose among providers that offer the needed level of care, that participate in Medicare, Medicaid or managed care program needed by the patient, have an available bed and are willing to accept the patient.  Yes   Patient/family informed of [facility]'s ownership interest in [facility] and [facility], as well as of the fact that they are under no obligation to receive care at these facilities.  PASRR submitted to EDS on       PASRR number received on       Existing PASRR number confirmed on 11/29/17     FL2 transmitted to all facilities in geographic area requested by pt/family on       FL2 transmitted to all facilities within larger geographic area on 11/29/17     Patient informed that his/her managed care company has contracts with or will negotiate with certain facilities, including the following:        Yes   Patient/family informed of bed offers received.  Patient chooses bed at [facility]     Physician recommends and patient chooses bed at      Patient to be transferred to [facility] on 12/08/17.  Patient to be transferred to facility by [facility]     Patient family notified on 12/08/17 of transfer.  Name of family member notified:  spouse advised      PHYSICIAN       Additional Comment:     _______________________________________________ Normajean Baxter, LCSW 12/08/2017, 11:52 AM

## 2017-12-08 NOTE — Progress Notes (Signed)
Discharged to [facility] via [facility]- husband is present

## 2017-12-08 NOTE — Social Work (Addendum)
Clinical Social Worker facilitated patient discharge including contacting patient family and facility to confirm patient discharge plans.  Clinical information faxed to facility and family agreeable with plan.    CSW arranged ambulance transport via [facility] to [facility] at 2:00pm.    RN to call [phone] to give report prior to discharge.  Clinical Social Worker will sign off for now as social work intervention is no longer needed. Please consult [place] again if new need arises.  Elissa Hefty, LCSW Clinical Social Worker [phone]

## 2017-12-09 ENCOUNTER — Other Ambulatory Visit: Payer: Self-pay | Admitting: *Deleted

## 2017-12-09 ENCOUNTER — Encounter: Payer: Self-pay | Admitting: Internal Medicine

## 2017-12-09 ENCOUNTER — Telehealth (INDEPENDENT_AMBULATORY_CARE_PROVIDER_SITE_OTHER): Payer: Self-pay | Admitting: Orthopedic Surgery

## 2017-12-09 ENCOUNTER — Non-Acute Institutional Stay (SKILLED_NURSING_FACILITY): Payer: Medicare Other | Admitting: Internal Medicine

## 2017-12-09 DIAGNOSIS — I10 Essential (primary) hypertension: Secondary | ICD-10-CM | POA: Diagnosis not present

## 2017-12-09 DIAGNOSIS — M810 Age-related osteoporosis without current pathological fracture: Secondary | ICD-10-CM

## 2017-12-09 DIAGNOSIS — I739 Peripheral vascular disease, unspecified: Secondary | ICD-10-CM

## 2017-12-09 DIAGNOSIS — L97813 Non-pressure chronic ulcer of other part of right lower leg with necrosis of muscle: Secondary | ICD-10-CM

## 2017-12-09 DIAGNOSIS — D508 Other iron deficiency anemias: Secondary | ICD-10-CM

## 2017-12-09 DIAGNOSIS — E034 Atrophy of thyroid (acquired): Secondary | ICD-10-CM | POA: Diagnosis not present

## 2017-12-09 DIAGNOSIS — L97812 Non-pressure chronic ulcer of other part of right lower leg with fat layer exposed: Secondary | ICD-10-CM | POA: Diagnosis not present

## 2017-12-09 DIAGNOSIS — D62 Acute posthemorrhagic anemia: Secondary | ICD-10-CM | POA: Diagnosis not present

## 2017-12-09 DIAGNOSIS — S81001S Unspecified open wound, right knee, sequela: Secondary | ICD-10-CM

## 2017-12-09 NOTE — Telephone Encounter (Signed)
Shelby Reese needs wound care orders for patient. Cb# facility [phone] but would like a call on her cell if possible # [phone].

## 2017-12-09 NOTE — Patient Outreach (Signed)
[facility]) Care Management  12/09/2017  GENETTE HUERTAS 07-11-1935 [id number]    RN initial outreach attempt unsuccessful but able to leave a HIPAA approved voice message requesting a call back. Will inquire further at that time on pt's possible needs. Will rescheduled another transition of care call this week.  Raina Mina, RN Care Management Coordinator [place] Office [phone]

## 2017-12-09 NOTE — Progress Notes (Signed)
: Provider:  Noah Delaine. Sheppard Coil, MD Location:  [facility] Room Number: [phone] Place of Service:  SNF ([phone])  PCP: [name], MD Patient Care Team: [name], MD as PCP - General (Internal Medicine) Carolan Clines, MD as Attending Physician (Urology) Charlies Silvers, MD as Consulting Physician (Allergy)  Extended Emergency Contact Information Primary Emergency Contact: Alles,Michael Address: [address]          [place], [place] [address] Johnnette Litter of [place] Phone: [phone] Relation: Spouse     Allergies: Cinnamon; Ciprofloxacin; Diovan [valsartan]; Food; Latex; Nitrofuran derivatives; Penicillins; Bactrim [sulfamethoxazole-trimethoprim]; Other; and Sulfa antibiotics  Chief Complaint  Patient presents with  . New Admit To SNF    Admit to Facility    HPI: Patient is 83 y.o. female with hypertension, hypothyroidism, and peripheral vascular disease with history of deep tissue wound to medial right knee the dates back to at least August 2018 when patient had skin graft and wound VAC placed per Dr.Duda she failed. Patient had replacement procedure of skin graft split thickness with wound VAC placed on 12/05/17. No surgery strongly sparse but apparently there were no consultations. Patient is admitted to skilled nursing facility for OT/PT and for wound care. While at skilled nursing facility patient will be followed for hypertension treated with benazepril, diltiazem and hydralazine, hypothyroidism treated with Synthroid and anemia treated with iron.  Past Medical History:  Diagnosis Date  . Allergy   . Anemia   . CKD (chronic kidney disease), stage II   . Fibromyalgia   . GERD (gastroesophageal reflux disease)   . Hiatal hernia   . Hypertension   . Hypothyroid   . Lymphedema    venous insufficency  . Osteoarthritis   . Peripheral vascular disease ([place])   . Ulcer of knee ([place])    right  . Urinary incontinence   . Varicose veins    . Venous insufficiency     Past Surgical History:  Procedure Laterality Date  . APPLICATION OF WOUND VAC Right 07/18/2017   Procedure: APPLICATION OF WOUND VAC;  Surgeon: [name], MD;  Location: [facility];  Service: Orthopedics;  Laterality: Right;  . CATARACT EXTRACTION W/ INTRAOCULAR LENS  IMPLANT, BILATERAL    . COLONOSCOPY    . I&D EXTREMITY Right 07/18/2017   Procedure: IRRIGATION AND DEBRIDEMENT RIGHT KNEE;  Surgeon: [name], MD;  Location: [facility];  Service: Orthopedics;  Laterality: Right;  . I&D EXTREMITY Right 07/23/2017   Procedure: REPEAT IRRIGATION AND DEBRIDEMENT RIGHT KNEE;  Surgeon: [name], MD;  Location: [facility];  Service: Orthopedics;  Laterality: Right;  . I&D EXTREMITY Right 11/28/2017   Procedure: IRRIGATION AND DEBRIDEMENT RIGHT KNEE , APPLY INSTILLATION VAC;  Surgeon: [name], MD;  Location: [facility];  Service: Orthopedics;  Laterality: Right;  . I&D EXTREMITY Right 12/03/2017   Procedure: REPEAT IRRIGATION AND DEBRIDEMENT RIGHT KNEE AND APPLICATION OF A WOUND VAC.;  Surgeon: [name], MD;  Location: [facility];  Service: Orthopedics;  Laterality: Right;  . KNEE ARTHROSCOPY Left    menisectomy  . MULTIPLE TOOTH EXTRACTIONS    . SKIN SPLIT GRAFT Right 07/25/2017   Procedure: Repeat Irrigation and Debridement Right Knee, Split Thickness Skin Graft;  Surgeon: [name], MD;  Location: [facility];  Service: Orthopedics;  Laterality: Right;  . SKIN SPLIT GRAFT Right 12/05/2017   Procedure: SKIN GRAFT SPLIT THICKNESS WOUND KNEE, APPLY VAC;  Surgeon: [name], MD;  Location: [facility];  Service: Orthopedics;  Laterality: Right;  . TONSILLECTOMY      Allergies as of 12/09/2017      Reactions   Cinnamon Hives   Ciprofloxacin Other (See Comments)   TREMORS   Diovan [valsartan] Other (See Comments)   Extreme vertigo   Food Diarrhea, Other (See Comments)   ORANGE JUICE   UPSET STOMACH   Latex Rash, Other (See Comments)   Rash/inflammation due to exposure     Nitrofuran Derivatives Hives, Rash   "Full body rash"   Penicillins Hives, Swelling   *tolerated Ceftriaxone September 2018 Has patient had a PCN reaction causing immediate rash, facial/tongue/throat swelling, SOB or lightheadedness with hypotension:No--severe irritation at the injection site Has patient had a PCN reaction causing severe rash involving mucus membranes or skin necrosis:Unknown Has patient had a PCN reaction that required hospitalization:No Has patient had a PCN reaction occurring within the last 10 years:Yes If all of the above answers are "NO", then may proceed with   Bactrim [sulfamethoxazole-trimethoprim] Diarrhea, Nausea Only   Other Rash   Mycins   Sulfa Antibiotics Diarrhea, Nausea Only      Medication List        Accurate as of 12/09/17  9:25 AM. Always use your most recent med list.          acetaminophen 500 MG tablet Commonly known as:  TYLENOL Take 1,000 mg by mouth daily.   aspirin EC 81 MG tablet Take 81 mg by mouth daily with breakfast.   benazepril 40 MG tablet Commonly known as:  LOTENSIN Take 40 mg by mouth daily with breakfast.   CALCIUM 600+D 600-400 MG-UNIT tablet Generic drug:  Calcium Carbonate-Vitamin D Take 1 tablet by mouth daily at 3 pm.   cetirizine 10 MG tablet Commonly known as:  ZYRTEC Take 10 mg by mouth at bedtime.   diltiazem 120 MG 24 hr capsule Commonly known as:  TIAZAC Take 120 mg by mouth daily with breakfast.   diphenhydrAMINE 25 mg capsule Commonly known as:  BENADRYL Take 50 mg by mouth every 6 (six) hours.   famotidine 40 MG tablet Commonly known as:  PEPCID Take 40 mg by mouth daily at 3 pm. 1600   ferrous sulfate 325 (65 FE) MG tablet Take 325 mg by mouth 2 (two) times daily with a meal.   Gauze Pads & Dressings 4"X4-1/2" Pads 2 Units by Does not apply route 4 (four) times daily.   KERLIX GAUZE ROLL LARGE Misc 1 Units by Does not apply route 4 (four) times daily.   hydrALAZINE 25 MG  tablet Commonly known as:  APRESOLINE Take 1 tablet (25 mg total) by mouth every 8 (eight) hours.   hydrocortisone cream 0.5 % Apply topically 3 (three) times daily.   ibuprofen 200 MG tablet Commonly known as:  ADVIL,MOTRIN Take 400 mg by mouth daily.   MAG-200 PO Take 200 mg by mouth daily at 3 pm.   multivitamin with minerals Tabs tablet Take 1 tablet by mouth daily.   NATURAL PSYLLIUM FIBER PO Take 1 capsule by mouth daily.   oxyCODONE-acetaminophen 5-325 MG tablet Commonly known as:  PERCOCET/ROXICET Take 1 tablet by mouth every 4 (four) hours as needed for severe pain.   predniSONE 20 MG tablet Commonly known as:  DELTASONE Take 2 tablets (40 mg total) by mouth daily with breakfast. 40 mg on 10/17, 20 mg on 10/18, 10 mg on 10/19 and then 5mg  on 10/20   silver sulfADIAZINE 1 %  cream Commonly known as:  SILVADENE Apply 1 application topically daily.   SYNTHROID 175 MCG tablet Generic drug:  levothyroxine Take 175 mcg by mouth daily before breakfast.   vitamin C 500 MG tablet Commonly known as:  ASCORBIC ACID Take 500 mg by mouth daily.       No orders of the defined types were placed in this encounter.   Immunization History  Administered Date(s) Administered  . Tdap 06/28/2015    Social History   Tobacco Use  . Smoking status: Former Smoker    Packs/day: 0.50    Years: 10.00    Pack years: 5.00    Last attempt to quit: 11/19/1979    Years since quitting: 38.0  . Smokeless tobacco: Never Used  Substance Use Topics  . Alcohol use: Yes    Alcohol/week: 8.4 oz    Types: 14 Glasses of wine per week    Comment: 2 glasses of wine with dinner    Family history is   Family History  Problem Relation Age of Onset  . Stroke Mother   . Varicose Veins Mother   . Cancer Father        prostate  . Stroke Sister   . Heart disease Sister   . Varicose Veins Sister   . Stroke Maternal Grandmother   . Varicose Veins Sister       Review of  Systems  DATA OBTAINED: from patient, nurse GENERAL:  no fevers, fatigue, appetite changes SKIN: No itching, or rash EYES: No eye pain, redness, discharge EARS: No earache, tinnitus, change in hearing NOSE: No congestion, drainage or bleeding  MOUTH/THROAT: No mouth or tooth pain, No sore throat RESPIRATORY: No cough, wheezing, SOB CARDIAC: No chest pain, palpitations, lower extremity edema  GI: No abdominal pain, No N/V/D or constipation, No heartburn or reflux  GU: No dysuria, frequency or urgency, or incontinence  MUSCULOSKELETAL: No unrelieved bone/joint pain NEUROLOGIC: No headache, dizziness or focal weakness PSYCHIATRIC: No c/o anxiety or sadness   Vitals:   12/09/17 0858  BP: (!) 175/63  Pulse: 84  Resp: 18  Temp: (!) 97 F (36.1 C)  SpO2: 95%    SpO2 Readings from Last 1 Encounters:  12/09/17 95%   Body mass index is 40.21 kg/m.     Physical Exam  GENERAL APPEARANCE: Alert, conversant,  No acute distress.  SKIN: No diaphoresis rash; bilateral legs wrapped some: VAC in place right knee HEAD: Normocephalic, atraumatic  EYES: Conjunctiva/lids clear. Pupils round, reactive. EOMs intact.  EARS: External exam WNL, canals clear. Hearing grossly normal.  NOSE: No deformity or discharge.  MOUTH/THROAT: Lips w/o lesions  RESPIRATORY: Breathing is even, unlabored. Lung sounds are clear   CARDIOVASCULAR: Heart RRR no murmurs, rubs or gallops. Trace  peripheral edema.   GASTROINTESTINAL: Abdomen is soft, non-tender, not distended w/ normal bowel sounds. GENITOURINARY: Bladder non tender, not distended  MUSCULOSKELETAL: No abnormal joints or musculature NEUROLOGIC:  Cranial nerves 2-12 grossly intact. Moves all extremities  PSYCHIATRIC: Mood and affect appropriate to situation, no behavioral issues  Patient Active Problem List   Diagnosis Date Noted  . Open wound of right knee 11/28/2017  . Allergic drug rash   . Sepsis ([place]) 08/29/2017  . CKD (chronic kidney  disease), stage III ([place]) 08/29/2017  . Hypothyroid 08/29/2017  . Hypertension 08/29/2017  . Peripheral vascular disease ([place]) 08/29/2017  . Skin ulcer of knee, right, with fat layer exposed ([place]) 08/29/2017  . Acute kidney injury ([place]) 08/29/2017  . Metabolic  acidosis 08/29/2017  . Acute hyponatremia 08/29/2017  . Pressure injury of skin 07/25/2017  . Wound, open, knee, lower leg, or ankle with complication, right, initial encounter   . Idiopathic chronic venous hypertension of both lower extremities with ulcer and inflammation ([place]) 07/15/2017  . Skin ulcer of right knee with necrosis of muscle ([place]) 07/15/2017  . Atherosclerosis of artery of right lower extremity ([place]) 07/11/2017  . Essential hypertension 07/11/2017  . Osteoporosis 07/11/2017  . Cataracts, bilateral 07/11/2017  . Hypothyroidism, acquired 12/27/2014  . Anemia, iron deficiency 12/27/2014  . Hiatal hernia 12/27/2014  . Varicose veins of lower extremities with other complications 24/40/1027      Labs reviewed: Basic Metabolic Panel:    Component Value Date/Time   NA 131 (L) 11/29/2017 0513   NA 140 04/15/2013 1516   K 5.0 11/29/2017 0513   K 4.3 04/15/2013 1516   CL 101 11/29/2017 0513   CL 105 04/15/2013 1516   CO2 22 11/29/2017 0513   CO2 25 04/15/2013 1516   GLUCOSE 155 (H) 11/29/2017 0513   GLUCOSE 94 04/15/2013 1516   BUN 29 (H) 11/29/2017 0513   BUN 28.2 (H) 04/15/2013 1516   CREATININE 1.36 (H) 11/29/2017 0513   CREATININE 1.21 (H) 12/25/2014 1530   CREATININE 1.4 (H) 04/15/2013 1516   CALCIUM 8.8 (L) 11/29/2017 0513   CALCIUM 9.9 04/15/2013 1516   PROT 8.0 09/09/2017 2137   PROT 7.8 04/15/2013 1516   ALBUMIN 3.9 09/09/2017 2137   ALBUMIN 3.7 04/15/2013 1516   AST 19 09/09/2017 2137   AST 17 04/15/2013 1516   ALT 24 09/09/2017 2137   ALT 15 04/15/2013 1516   ALKPHOS 61 09/09/2017 2137   ALKPHOS 73 04/15/2013 1516   BILITOT 0.6 09/09/2017 2137   BILITOT 0.25 04/15/2013 1516   GFRNONAA 36  (L) 11/29/2017 0513   GFRNONAA 43 (L) 12/25/2014 1530   GFRAA 41 (L) 11/29/2017 0513   GFRAA 50 (L) 12/25/2014 1530    Recent Labs    08/29/17 1756  11/12/17 1800 11/28/17 0955 11/29/17 0513  NA  --    < > 132* 133* 131*  K  --    < > 4.3 4.8 5.0  CL  --    < > 104 102 101  CO2  --    < > 19* 19* 22  GLUCOSE  --    < > 86 110* 155*  BUN  --    < > 27* 29* 29*  CREATININE  --    < > 1.71* 1.67* 1.36*  CALCIUM  --    < > 9.6 9.6 8.8*  MG 1.8  --   --   --   --    < > = values in this interval not displayed.   Liver Function Tests: Recent Labs    08/29/17 1255 08/30/17 0403 09/09/17 2137  AST 24 22 19   ALT 15 13* 24  ALKPHOS 65 54 61  BILITOT 0.6 0.6 0.6  PROT 7.2 5.7* 8.0  ALBUMIN 3.4* 2.6* 3.9   No results for input(s): LIPASE, AMYLASE in the last 8760 hours. No results for input(s): AMMONIA in the last 8760 hours. CBC: Recent Labs    09/02/17 0218  09/03/17 0155  09/09/17 2137 11/12/17 1800 11/28/17 0955  WBC 10.9*  --  14.6*   < > 18.0* 12.1* 12.8*  NEUTROABS 8.3*  --  13.1*  --  15.7*  --   --   HGB 8.0*   < >  8.7*   < > 10.3* 8.0* 8.0*  HCT 25.8*   < > 29.0*   < > 34.0* 26.5* 27.5*  MCV 83.8  --  82.9   < > 84.4 83.1 81.4  PLT 239  --  284   < > 478* 413* 518*   < > = values in this interval not displayed.   Lipid No results for input(s): CHOL, HDL, LDLCALC, TRIG in the last 8760 hours.  Cardiac Enzymes: Recent Labs    08/29/17 1756  CKTOTAL 81   BNP: No results for input(s): BNP in the last 8760 hours. No results found for: MICROALBUR No results found for: HGBA1C Lab Results  Component Value Date   TSH 0.298 (L) 09/09/2017   Lab Results  Component Value Date   VITAMINB12 696 07/26/2017   Lab Results  Component Value Date   FOLATE 20.7 07/26/2017   Lab Results  Component Value Date   IRON 18 (L) 07/26/2017   TIBC 307 07/26/2017   FERRITIN 27 07/26/2017    Imaging and Procedures obtained prior to SNF admission: No results  found.   Not all labs, radiology exams or other studies done during hospitalization come through on my EPIC note; however they are reviewed by me.    Assessment and Plan  Recurrent wound, right knee-had same procedure in August 2018 which failed; repeat skin graft split thickness with wound VAC; SNF - patient admitted for OT/PT and for wound care  Acute blood loss anemia, postop SNF - preoperative hemoglobin was 8; no transfusion was done; repeat CBC in 5 days; continue iron 325 mg by mouth twice a day  Peripheral vascular disease SNF - continue ASA 81 mg by mouth daily, patient is not on statin  Hypertension SNF - not well-controlled, however this is transferred today, we'll give it several more days of retained blood pressure regimen; in the meantime continue benazepril 40 mg by mouth daily, diltiazem 120 mg by mouth daily and hydralazine 25 mg every 8  Hypothyroidism SNF - daily Synthroid 175 g by mouth daily  Osteoporosis SNF -continue calcium plus D6 100-400 one by mouth daily   Time spent greater than 45 minutes;> 50% of time with patient was spent reviewing records, labs, tests and studies, counseling and developing plan of care  Webb Silversmith D. Sheppard Coil, MD

## 2017-12-10 ENCOUNTER — Emergency Department (HOSPITAL_COMMUNITY)
Admission: EM | Admit: 2017-12-10 | Discharge: 2017-12-10 | Disposition: A | Discharge status: Skilled Nursing Facility | Payer: Medicare Other | Source: Home / Self Care | Attending: Emergency Medicine | Admitting: Emergency Medicine

## 2017-12-10 ENCOUNTER — Other Ambulatory Visit: Payer: Self-pay

## 2017-12-10 ENCOUNTER — Telehealth (INDEPENDENT_AMBULATORY_CARE_PROVIDER_SITE_OTHER): Payer: Self-pay | Admitting: Radiology

## 2017-12-10 ENCOUNTER — Other Ambulatory Visit: Payer: Self-pay | Admitting: *Deleted

## 2017-12-10 DIAGNOSIS — E43 Unspecified severe protein-calorie malnutrition: Secondary | ICD-10-CM | POA: Diagnosis not present

## 2017-12-10 DIAGNOSIS — Z87891 Personal history of nicotine dependence: Secondary | ICD-10-CM | POA: Insufficient documentation

## 2017-12-10 DIAGNOSIS — N182 Chronic kidney disease, stage 2 (mild): Secondary | ICD-10-CM

## 2017-12-10 DIAGNOSIS — F0390 Unspecified dementia without behavioral disturbance: Secondary | ICD-10-CM

## 2017-12-10 DIAGNOSIS — T8140XA Infection following a procedure, unspecified, initial encounter: Secondary | ICD-10-CM | POA: Diagnosis not present

## 2017-12-10 DIAGNOSIS — Z882 Allergy status to sulfonamides status: Secondary | ICD-10-CM

## 2017-12-10 DIAGNOSIS — Z978 Presence of other specified devices: Secondary | ICD-10-CM

## 2017-12-10 DIAGNOSIS — I96 Gangrene, not elsewhere classified: Secondary | ICD-10-CM | POA: Diagnosis not present

## 2017-12-10 DIAGNOSIS — A419 Sepsis, unspecified organism: Secondary | ICD-10-CM | POA: Diagnosis not present

## 2017-12-10 DIAGNOSIS — S81801D Unspecified open wound, right lower leg, subsequent encounter: Secondary | ICD-10-CM

## 2017-12-10 DIAGNOSIS — E039 Hypothyroidism, unspecified: Secondary | ICD-10-CM | POA: Insufficient documentation

## 2017-12-10 DIAGNOSIS — Z880 Allergy status to penicillin: Secondary | ICD-10-CM | POA: Insufficient documentation

## 2017-12-10 DIAGNOSIS — D649 Anemia, unspecified: Secondary | ICD-10-CM | POA: Diagnosis not present

## 2017-12-10 DIAGNOSIS — Y33XXXD Other specified events, undetermined intent, subsequent encounter: Secondary | ICD-10-CM

## 2017-12-10 DIAGNOSIS — Z5189 Encounter for other specified aftercare: Secondary | ICD-10-CM

## 2017-12-10 DIAGNOSIS — Z79899 Other long term (current) drug therapy: Secondary | ICD-10-CM

## 2017-12-10 DIAGNOSIS — Z7982 Long term (current) use of aspirin: Secondary | ICD-10-CM | POA: Insufficient documentation

## 2017-12-10 DIAGNOSIS — I129 Hypertensive chronic kidney disease with stage 1 through stage 4 chronic kidney disease, or unspecified chronic kidney disease: Secondary | ICD-10-CM | POA: Insufficient documentation

## 2017-12-10 DIAGNOSIS — L97829 Non-pressure chronic ulcer of other part of left lower leg with unspecified severity: Secondary | ICD-10-CM | POA: Diagnosis not present

## 2017-12-10 DIAGNOSIS — M79604 Pain in right leg: Secondary | ICD-10-CM | POA: Diagnosis not present

## 2017-12-10 DIAGNOSIS — M7989 Other specified soft tissue disorders: Secondary | ICD-10-CM | POA: Diagnosis not present

## 2017-12-10 DIAGNOSIS — Z6841 Body Mass Index (BMI) 40.0 and over, adult: Secondary | ICD-10-CM | POA: Diagnosis not present

## 2017-12-10 DIAGNOSIS — L03115 Cellulitis of right lower limb: Secondary | ICD-10-CM | POA: Diagnosis not present

## 2017-12-10 DIAGNOSIS — M25561 Pain in right knee: Secondary | ICD-10-CM | POA: Diagnosis not present

## 2017-12-10 MED ORDER — OXYCODONE-ACETAMINOPHEN 5-325 MG PO TABS
1.0000 | ORAL_TABLET | Freq: Once | ORAL | Status: AC
  Administered 2017-12-10: 1 via ORAL
  Filled 2017-12-10: qty 1

## 2017-12-10 NOTE — ED Notes (Signed)
Spoke with Butch Penny, wound RN. We do not carry the supplies to change out her wound vac or empty it as it is a one way valve. Dr. Eulis Foster and Lenward Chancellor at facility aware, report given. [facility] called for transport.

## 2017-12-10 NOTE — ED Notes (Signed)
Would care nurse paged.

## 2017-12-10 NOTE — Telephone Encounter (Signed)
[place]y wound care RN from [facility], patient discharge with prevena wound vac and bilateral lower extremity compression wraps. They is foul odor from leg, medical director advised her to seek further medical care at ED. Advised I will let Dr. Sharol Given know, he will be in the office tomorrow morning.

## 2017-12-10 NOTE — ED Provider Notes (Signed)
[facility] DEPT Provider Note   CSN: [id number] Arrival date & time: 12/10/17  1503     History   Chief Complaint Chief Complaint  Patient presents with  . Wound Check  . Leg Pain    HPI Shelby Reese is a 83 y.o. female.  She is here for evaluation of a wound on her right leg, which has a wound VAC, and it has been noted to have an increased amount of drainage in the receptacle canister.  Also, it is felt that her drainage has a follow odor.  This history was obtained by telephone call, from the nursing care facility where she is residing.  She was discharged from the hospital 2 days ago to the facility, and it appears that they do not have adequate wound care orders, and I am not familiar with this patient.  Patient is unable to give any history.  Level 5 caveat-dementia  HPI  Past Medical History:  Diagnosis Date  . Allergy   . Anemia   . CKD (chronic kidney disease), stage II   . Fibromyalgia   . GERD (gastroesophageal reflux disease)   . Hiatal hernia   . Hypertension   . Hypothyroid   . Lymphedema    venous insufficency  . Osteoarthritis   . Peripheral vascular disease ([place])   . Ulcer of knee ([place])    right  . Urinary incontinence   . Varicose veins   . Venous insufficiency     Patient Active Problem List   Diagnosis Date Noted  . Open wound of right knee 11/28/2017  . Allergic drug rash   . Sepsis ([place]) 08/29/2017  . CKD (chronic kidney disease), stage III ([place]) 08/29/2017  . Hypothyroid 08/29/2017  . Hypertension 08/29/2017  . Peripheral vascular disease ([place]) 08/29/2017  . Skin ulcer of knee, right, with fat layer exposed ([place]) 08/29/2017  . Acute kidney injury ([place]) 08/29/2017  . Metabolic acidosis 24/40/1027  . Acute hyponatremia 08/29/2017  . Pressure injury of skin 07/25/2017  . Wound, open, knee, lower leg, or ankle with complication, right, initial encounter   . Idiopathic chronic venous hypertension of both  lower extremities with ulcer and inflammation ([place]) 07/15/2017  . Skin ulcer of right knee with necrosis of muscle ([place]) 07/15/2017  . Atherosclerosis of artery of right lower extremity ([place]) 07/11/2017  . Essential hypertension 07/11/2017  . Osteoporosis 07/11/2017  . Cataracts, bilateral 07/11/2017  . Hypothyroidism, acquired 12/27/2014  . Anemia, iron deficiency 12/27/2014  . Hiatal hernia 12/27/2014  . Varicose veins of lower extremities with other complications 25/36/6440    Past Surgical History:  Procedure Laterality Date  . APPLICATION OF WOUND VAC Right 07/18/2017   Procedure: APPLICATION OF WOUND VAC;  Surgeon: [name], MD;  Location: [facility];  Service: Orthopedics;  Laterality: Right;  . CATARACT EXTRACTION W/ INTRAOCULAR LENS  IMPLANT, BILATERAL    . COLONOSCOPY    . I&D EXTREMITY Right 07/18/2017   Procedure: IRRIGATION AND DEBRIDEMENT RIGHT KNEE;  Surgeon: [name], MD;  Location: [facility];  Service: Orthopedics;  Laterality: Right;  . I&D EXTREMITY Right 07/23/2017   Procedure: REPEAT IRRIGATION AND DEBRIDEMENT RIGHT KNEE;  Surgeon: [name], MD;  Location: [facility];  Service: Orthopedics;  Laterality: Right;  . I&D EXTREMITY Right 11/28/2017   Procedure: IRRIGATION AND DEBRIDEMENT RIGHT KNEE , APPLY INSTILLATION VAC;  Surgeon: [name], MD;  Location: [facility];  Service: Orthopedics;  Laterality: Right;  .  I&D EXTREMITY Right 12/03/2017   Procedure: REPEAT IRRIGATION AND DEBRIDEMENT RIGHT KNEE AND APPLICATION OF A WOUND VAC.;  Surgeon: [name], MD;  Location: [facility];  Service: Orthopedics;  Laterality: Right;  . KNEE ARTHROSCOPY Left    menisectomy  . MULTIPLE TOOTH EXTRACTIONS    . SKIN SPLIT GRAFT Right 07/25/2017   Procedure: Repeat Irrigation and Debridement Right Knee, Split Thickness Skin Graft;  Surgeon: [name], MD;  Location: [facility];  Service: Orthopedics;  Laterality: Right;  . SKIN SPLIT GRAFT Right 12/05/2017   Procedure: SKIN GRAFT SPLIT  THICKNESS WOUND KNEE, APPLY VAC;  Surgeon: [name], MD;  Location: [facility];  Service: Orthopedics;  Laterality: Right;  . TONSILLECTOMY      OB History    No data available       Home Medications    Prior to Admission medications   Medication Sig Start Date End Date Taking? Authorizing Provider  hydrALAZINE (APRESOLINE) 25 MG tablet Take 1 tablet (25 mg total) by mouth every 8 (eight) hours. 09/04/17  Yes Debbe Odea, MD  acetaminophen (TYLENOL) 500 MG tablet Take 1,000 mg by mouth daily.    [provider]  aspirin EC 81 MG tablet Take 81 mg by mouth daily with breakfast.    [provider]  benazepril (LOTENSIN) 40 MG tablet Take 40 mg by mouth daily with breakfast.  03/26/13   [provider]  Calcium Carbonate-Vitamin D (CALCIUM 600+D) 600-400 MG-UNIT per tablet Take 1 tablet by mouth daily at 3 pm.     [provider]  cetirizine (ZYRTEC) 10 MG tablet Take 10 mg by mouth at bedtime.     [provider]  diltiazem (TIAZAC) 120 MG 24 hr capsule Take 120 mg by mouth daily with breakfast.  01/29/13   [provider]  diphenhydrAMINE (BENADRYL) 25 mg capsule Take 50 mg by mouth every 6 (six) hours.     [provider]  famotidine (PEPCID) 40 MG tablet Take 40 mg by mouth daily at 3 pm. 1600    [provider]  ferrous sulfate 325 (65 FE) MG tablet Take 325 mg by mouth 2 (two) times daily with a meal.    [provider]  Gauze Pads & Dressings (KERLIX GAUZE ROLL LARGE) MISC 1 Units by Does not apply route 4 (four) times daily. 07/14/17   Suzan Slick, NP  Gauze Pads & Dressings 4"X4-1/2" PADS 2 Units by Does not apply route 4 (four) times daily. 07/14/17   Suzan Slick, NP  hydrocortisone cream 0.5 % Apply topically 3 (three) times daily. 09/04/17   Debbe Odea, MD  ibuprofen (ADVIL,MOTRIN) 200 MG tablet Take 400 mg by mouth daily.    [provider]  Magnesium Oxide (MAG-200 PO) Take 200 mg  by mouth daily at 3 pm.    [provider]  Multiple Vitamin (MULTIVITAMIN WITH MINERALS) TABS tablet Take 1 tablet by mouth daily.    [provider]  NATURAL PSYLLIUM FIBER PO Take 1 capsule by mouth daily.    [provider]  oxyCODONE-acetaminophen (PERCOCET/ROXICET) 5-325 MG tablet Take 1 tablet by mouth every 4 (four) hours as needed for severe pain. 12/08/17   Suzan Slick, NP  predniSONE (DELTASONE) 20 MG tablet Take 2 tablets (40 mg total) by mouth daily with breakfast. 40 mg on 10/17, 20 mg on 10/18, 10 mg on 10/19 and then 5mg  on 10/20 09/05/17   Debbe Odea, MD  silver sulfADIAZINE (SILVADENE) 1 % cream Apply 1 application topically daily. 09/04/17   Debbe Odea, MD  SYNTHROID 175 MCG tablet Take 175 mcg by mouth daily before breakfast.  09/20/15   [provider]  vitamin C (ASCORBIC ACID) 500 MG tablet Take 500 mg by mouth daily.    [provider]    Family History Family History  Problem Relation Age of Onset  . Stroke Mother   . Varicose Veins Mother   . Cancer Father        prostate  . Stroke Sister   . Heart disease Sister   . Varicose Veins Sister   . Stroke Maternal Grandmother   . Varicose Veins Sister     Social History Social History   Tobacco Use  . Smoking status: Former Smoker    Packs/day: 0.50    Years: 10.00    Pack years: 5.00    Last attempt to quit: 11/19/1979    Years since quitting: 38.0  . Smokeless tobacco: Never Used  Substance Use Topics  . Alcohol use: Yes    Alcohol/week: 8.4 oz    Types: 14 Glasses of wine per week    Comment: 2 glasses of wine with dinner  . Drug use: No     Allergies   Cinnamon; Ciprofloxacin; Diovan [valsartan]; Food; Latex; Nitrofuran derivatives; Penicillins; Bactrim [sulfamethoxazole-trimethoprim]; Other; and Sulfa antibiotics   Review of Systems Review of Systems  Unable to perform ROS: Dementia     Physical Exam Updated Vital Signs BP (!) 136/58    Pulse 76   Temp 98.4 F (36.9 C) (Oral)   Resp 18   Ht 5\' 3"  (1.6 m)   Wt 103 kg (227 lb)   SpO2 97%   BMI 40.21 kg/m   Physical Exam  Constitutional: She appears well-developed.  Elderly, obese, frail  HENT:  Head: Normocephalic.  Resolving left periorbital and left forehead ecchymosis.  No facial instability or crepitation.  Eyes: Conjunctivae and EOM are normal. Pupils are equal, round, and reactive to light.  Neck: Normal range of motion and phonation normal. Neck supple.  Cardiovascular: Normal rate and regular rhythm.  Pulmonary/Chest: Effort normal. She exhibits no tenderness.  Musculoskeletal:  Both lower legs have wraps, from mid thigh and right, and below knee on left, to the toes.  Both leg dressings appear to be intact, and fresh.  There is a drainage tube exiting the right proximal lower leg, below the knee, draining serosanguineous thin fluid into the wound VAC canister.  Neurological: She is alert. She exhibits normal muscle tone.  No dysarthria or aphasia.  Skin: Skin is warm and dry.  Psychiatric: She has a normal mood and affect. Her behavior is normal.  Nursing note and vitals reviewed.    ED Treatments / Results  Labs (all labs ordered are listed, but only abnormal results are displayed) Labs Reviewed - No data to display  EKG  EKG Interpretation None       Radiology No results found.  Procedures Procedures (including critical care time)  Medications Ordered in ED Medications  oxyCODONE-acetaminophen (PERCOCET/ROXICET) 5-325 MG per tablet 1 tablet (1 tablet Oral Given 12/10/17 1754)     Initial Impression / Assessment and Plan / ED Course  I have reviewed the triage vital signs and the nursing notes.  Pertinent labs & imaging results that were available during my care of the patient were reviewed by me and considered in my medical decision making (see chart for details).  Clinical Course as of Dec 11 1755  Wed Dec 10, 2017  1541 I  discussed the situation with a nurse who is at her facility, and is somewhat familiar with her care.  Apparently today, the nurse treating the patient had to change the wound VAC canister several times because of drainage.  The nurse was concerned that the drainage smelled bad and had a larger amount than she was comfortable with.  She reportedly contacted the treating orthopedist's office who advised that she come to the emergency department for evaluation.  [EW]  2505 I discussed the case with Dondra Prader, nurse practitioner, who advises that she plans on seeing the patient tomorrow in the office.  She will arrange to have a new wound drain canister brought to the emergency department today, since the patient is having so much drainage.  She plans on removing the wound drain, tomorrow at the office appointment.  [EW]    Clinical Course User Index [EW] Daleen Bo, MD     Patient Vitals for the past 24 hrs:  BP Temp Temp src Pulse Resp SpO2 Height Weight  12/10/17 1750 (!) 136/58 - - 76 18 97 % - -  12/10/17 1526 (!) 141/48 98.4 F (36.9 C) Oral 96 18 97 % - -  12/10/17 1522 - - - - - - 5\' 3"  (1.6 m) 103 kg (227 lb)    4:37 PM Reevaluation with update and discussion. After initial assessment and treatment, an updated evaluation reveals no change in clinical status.  We attempted to change the canister receptacle on her wound VAC however was unable anything that would fit the device. Daleen Bo      Final Clinical Impressions(s) / ED Diagnoses   Final diagnoses:  Encounter for wound re-check    Patient's wound VAC is functioning correctly, producing a fair amount of secretions.  Vital signs are reassuring.  No indication for further intervention at this time.  Patient has short-term follow-up scheduled for tomorrow with her orthopedist who is managing her right lower leg wound.  Nursing Notes Reviewed/ Care Coordinated Applicable Imaging Reviewed Interpretation of Laboratory Data  incorporated into ED treatment  The patient appears reasonably screened and/or stabilized for discharge and I doubt any other medical condition or other [facility] requiring further screening, evaluation, or treatment in the ED at this time prior to discharge.  Plan: Home Medications-continue current medications; Home Treatments-wound care as currently being done, emptying canister as needed; return here if the recommended treatment, does not improve the symptoms; Recommended follow up-orthopedic follow-up tomorrow as scheduled.  Routine management at her facility by the provider there.   ED Discharge Orders    None       Daleen Bo, MD 12/10/17 1758

## 2017-12-10 NOTE — Patient Outreach (Signed)
[facility]) Care Management  12/10/2017  TANGALA WIEGERT 1935/05/19 [id number]   Transition of care (2nd attempt)  RN attempted outreach call once again however unsuccessful. RN spoke with pt's family and left a message requesting a call back. RN also informed family it pt prefers to leave a specific day and time for RN to return the call as an option. RN has informed family member that RN will attempt another outreach call tomorrow.  Raina Mina, RN Care Management Coordinator [facility] Office [phone]

## 2017-12-10 NOTE — Discharge Instructions (Signed)
There were no changes necessary for your care plan at this time.  It is important to follow-up with the orthopedic surgeon, Dr. Sharol Given, tomorrow as scheduled.  In the meantime continue to empty the wound VAC canister when it gets full.

## 2017-12-10 NOTE — ED Triage Notes (Signed)
Per EMS, patient comes from [facility] home; staff said they noticed an oder coming from her wound on her R leg. Patient c/o severe pain from the ambulance ride/movement on her RLE. Crying in room. Both legs are wrapped. Wound vac present. Alert and oriented.

## 2017-12-11 ENCOUNTER — Ambulatory Visit: Payer: Self-pay | Admitting: *Deleted

## 2017-12-11 ENCOUNTER — Ambulatory Visit (INDEPENDENT_AMBULATORY_CARE_PROVIDER_SITE_OTHER): Payer: Medicare Other | Admitting: Orthopedic Surgery

## 2017-12-11 ENCOUNTER — Encounter (INDEPENDENT_AMBULATORY_CARE_PROVIDER_SITE_OTHER): Payer: Self-pay | Admitting: Orthopedic Surgery

## 2017-12-11 DIAGNOSIS — L97813 Non-pressure chronic ulcer of other part of right lower leg with necrosis of muscle: Secondary | ICD-10-CM

## 2017-12-11 LAB — CBC AND DIFFERENTIAL
HEMATOCRIT: 23 — AB (ref 36–46)
HEMOGLOBIN: 7.2 — AB (ref 12.0–16.0)
NEUTROS ABS: 10
Platelets: 498 — AB (ref 150–399)
WBC: 13.3

## 2017-12-11 LAB — BASIC METABOLIC PANEL
BUN: 16 (ref 4–21)
Creatinine: 1.4 — AB (ref 0.5–1.1)
Glucose: 123
Potassium: 4 (ref 3.4–5.3)
SODIUM: 133 — AB (ref 137–147)

## 2017-12-11 NOTE — Pre-Procedure Instructions (Signed)
    Shelby Reese  12/11/2017      Your procedure is scheduled on Friday, January 25.  Report to [facility] Admitting at 6:30 AM       Pre Op Desk [phone]   Please send   Remember:  Do not eat food or drink liquids after midnight.  Take these medicines the morning of surgery with A SIP OF WATER  Synthyroid Diltiazem Hydralazine   Prn:  Tylenol or Oxycodone- Acetaminophen

## 2017-12-11 NOTE — Progress Notes (Addendum)
Office Visit Note   Patient: Shelby Reese           Date of Birth: 1934/12/31           MRN: [id number] Visit Date: 12/11/2017              Requested by: [name], MD [address] [place], [place] [address] PCP: [name], MD  Chief Complaint  Patient presents with  . Right Lower Leg - Routine Post Op    11/28/17, 12/03/17, 12/05/17 Skin graft split thickness wound vac right leg. Repeat I&D Right Knee with wound vac. I&D Right Knee Instillation Wound Vac.      HPI: Patient is a 83 year old woman who is status post limb salvage intervention for a large necrotic wound of the right knee.  Patient is having progressive venous stasis swelling in both legs with superficial skin changes from the swelling with necrosis of the dorsal aspect of the left ankle.  Patient states she cannot tolerate compression she cannot tolerate her current wound care she states that she no longer wishes to continue with limb salvage intervention and with her extreme pain she would like to proceed with an above-the-knee amputation.   Assessment & Plan: Visit Diagnoses:  1. Skin ulcer of right knee with necrosis of muscle ([place])     Plan: Per patient's request we will plan for an above-the-knee amputation on the right.  Discussed we would need to continue with compression dressings to keep the swelling down the left lower extremity.  Patient will need to be discharged back to skilled nursing postoperatively.  After further discussion patient states that she would like to hold on amputation surgery.  We offered her Toradol 30 mg IM to help her with her transportation back to skilled nursing.  Patient has also requested IV pain medicine orders were written for morphine 1-2 mg IV every 4 hours as needed for pain.  I discussed that her medical director would have to write these orders at the skilled nursing facility.  Orders were written for dressing changes to be continued for both lower  extremities.  Patient states that she would like to get a second opinion regarding her wound care.  Patient agrees to follow-up in 1 week.  Follow-Up Instructions: Return in about 2 weeks (around 12/25/2017).   Ortho Exam  Patient is alert, oriented, no adenopathy, well-dressed, normal affect, normal respiratory effort. Examination patient has some wrinkling of the skin of the left leg from the compression wraps.  She does have a superficial deep tissue injury over the dorsum of the left ankle secondary to swelling.  There is no skin breakdown at this time she is weeping edema from both legs she has superficial ulcers there are almost 2 circumferential for the right lower extremity there is a good healthy granulating wound bed from the skin graft area there is no signs of infection.  Imaging: No results found. No images are attached to the encounter.  Labs: Lab Results  Component Value Date   ESRSEDRATE 92 (H) 08/29/2017   REPTSTATUS 09/03/2017 FINAL 08/29/2017   CULT NO GROWTH 5 DAYS 08/29/2017   LABORGA No Salmonella,Shigella,Campylobacter,Yersinia,or 01/01/2015   LABORGA No E.coli 0157:H7 isolated. 01/01/2015    @LABSALLVALUES (HGBA1)@  There is no height or weight on file to calculate BMI.  Orders:  No orders of the defined types were placed in this encounter.  No orders of the defined types were placed in this encounter.    Procedures:  No procedures performed  Clinical Data: No additional findings.  ROS:  All other systems negative, except as noted in the HPI. Review of Systems  Objective: Vital Signs: There were no vitals taken for this visit.  Specialty Comments:  No specialty comments available.  PMFS History: Patient Active Problem List   Diagnosis Date Noted  . Open wound of right knee 11/28/2017  . Allergic drug rash   . Sepsis ([place]) 08/29/2017  . CKD (chronic kidney disease), stage III ([place]) 08/29/2017  . Hypothyroid 08/29/2017  . Hypertension  08/29/2017  . Peripheral vascular disease ([place]) 08/29/2017  . Skin ulcer of knee, right, with fat layer exposed ([place]) 08/29/2017  . Acute kidney injury ([place]) 08/29/2017  . Metabolic acidosis 76/73/4193  . Acute hyponatremia 08/29/2017  . Pressure injury of skin 07/25/2017  . Wound, open, knee, lower leg, or ankle with complication, right, initial encounter   . Idiopathic chronic venous hypertension of both lower extremities with ulcer and inflammation ([place]) 07/15/2017  . Skin ulcer of right knee with necrosis of muscle ([place]) 07/15/2017  . Atherosclerosis of artery of right lower extremity ([place]) 07/11/2017  . Essential hypertension 07/11/2017  . Osteoporosis 07/11/2017  . Cataracts, bilateral 07/11/2017  . Hypothyroidism, acquired 12/27/2014  . Anemia, iron deficiency 12/27/2014  . Hiatal hernia 12/27/2014  . Varicose veins of lower extremities with other complications 79/12/4095   Past Medical History:  Diagnosis Date  . Allergy   . Anemia   . CKD (chronic kidney disease), stage II   . Fibromyalgia   . GERD (gastroesophageal reflux disease)   . Hiatal hernia   . Hypertension   . Hypothyroid   . Lymphedema    venous insufficency  . Osteoarthritis   . Peripheral vascular disease ([employer])   . Ulcer of knee ([place])    right  . Urinary incontinence   . Varicose veins   . Venous insufficiency     Family History  Problem Relation Age of Onset  . Stroke Mother   . Varicose Veins Mother   . Cancer Father        prostate  . Stroke Sister   . Heart disease Sister   . Varicose Veins Sister   . Stroke Maternal Grandmother   . Varicose Veins Sister     Past Surgical History:  Procedure Laterality Date  . APPLICATION OF WOUND VAC Right 07/18/2017   Procedure: APPLICATION OF WOUND VAC;  Surgeon: [name], MD;  Location: [facility];  Service: Orthopedics;  Laterality: Right;  . CATARACT EXTRACTION W/ INTRAOCULAR LENS  IMPLANT, BILATERAL    . COLONOSCOPY    . I&D EXTREMITY Right  07/18/2017   Procedure: IRRIGATION AND DEBRIDEMENT RIGHT KNEE;  Surgeon: [name], MD;  Location: [facility];  Service: Orthopedics;  Laterality: Right;  . I&D EXTREMITY Right 07/23/2017   Procedure: REPEAT IRRIGATION AND DEBRIDEMENT RIGHT KNEE;  Surgeon: [name], MD;  Location: [facility];  Service: Orthopedics;  Laterality: Right;  . I&D EXTREMITY Right 11/28/2017   Procedure: IRRIGATION AND DEBRIDEMENT RIGHT KNEE , APPLY INSTILLATION VAC;  Surgeon: [name], MD;  Location: [facility];  Service: Orthopedics;  Laterality: Right;  . I&D EXTREMITY Right 12/03/2017   Procedure: REPEAT IRRIGATION AND DEBRIDEMENT RIGHT KNEE AND APPLICATION OF A WOUND VAC.;  Surgeon: [name], MD;  Location: [facility];  Service: Orthopedics;  Laterality: Right;  . KNEE ARTHROSCOPY Left    menisectomy  . MULTIPLE TOOTH EXTRACTIONS    .  SKIN SPLIT GRAFT Right 07/25/2017   Procedure: Repeat Irrigation and Debridement Right Knee, Split Thickness Skin Graft;  Surgeon: [name], MD;  Location: [facility];  Service: Orthopedics;  Laterality: Right;  . SKIN SPLIT GRAFT Right 12/05/2017   Procedure: SKIN GRAFT SPLIT THICKNESS WOUND KNEE, APPLY VAC;  Surgeon: [name], MD;  Location: [facility];  Service: Orthopedics;  Laterality: Right;  . TONSILLECTOMY     Social History   Occupational History    Comment: retired Marine scientist.   Tobacco Use  . Smoking status: Former Smoker    Packs/day: 0.50    Years: 10.00    Pack years: 5.00    Last attempt to quit: 11/19/1979    Years since quitting: 38.0  . Smokeless tobacco: Never Used  Substance and Sexual Activity  . Alcohol use: Yes    Alcohol/week: 8.4 oz    Types: 14 Glasses of wine per week    Comment: 2 glasses of wine with dinner  . Drug use: No  . Sexual activity: Not on file

## 2017-12-12 ENCOUNTER — Inpatient Hospital Stay (HOSPITAL_COMMUNITY): Admission: RE | Admit: 2017-12-12 | Payer: Medicare Other | Source: Ambulatory Visit | Admitting: Orthopedic Surgery

## 2017-12-12 ENCOUNTER — Emergency Department (HOSPITAL_COMMUNITY): Payer: Medicare Other

## 2017-12-12 ENCOUNTER — Inpatient Hospital Stay (HOSPITAL_COMMUNITY)
Admission: EM | Admit: 2017-12-12 | Discharge: 2017-12-23 | DRG: 853 | Disposition: A | Discharge status: Skilled Nursing Facility | Payer: Medicare Other | Attending: Internal Medicine | Admitting: Internal Medicine

## 2017-12-12 ENCOUNTER — Encounter: Payer: Self-pay | Admitting: Internal Medicine

## 2017-12-12 ENCOUNTER — Other Ambulatory Visit: Payer: Self-pay

## 2017-12-12 ENCOUNTER — Non-Acute Institutional Stay (SKILLED_NURSING_FACILITY): Payer: Medicare Other | Admitting: Internal Medicine

## 2017-12-12 ENCOUNTER — Encounter (HOSPITAL_COMMUNITY): Payer: Self-pay

## 2017-12-12 ENCOUNTER — Ambulatory Visit (INDEPENDENT_AMBULATORY_CARE_PROVIDER_SITE_OTHER): Payer: Medicare Other | Admitting: Family

## 2017-12-12 ENCOUNTER — Encounter (HOSPITAL_COMMUNITY): Admission: RE | Payer: Self-pay | Source: Ambulatory Visit

## 2017-12-12 DIAGNOSIS — D509 Iron deficiency anemia, unspecified: Secondary | ICD-10-CM

## 2017-12-12 DIAGNOSIS — L03116 Cellulitis of left lower limb: Secondary | ICD-10-CM | POA: Diagnosis not present

## 2017-12-12 DIAGNOSIS — M797 Fibromyalgia: Secondary | ICD-10-CM | POA: Diagnosis present

## 2017-12-12 DIAGNOSIS — Z6841 Body Mass Index (BMI) 40.0 and over, adult: Secondary | ICD-10-CM

## 2017-12-12 DIAGNOSIS — R269 Unspecified abnormalities of gait and mobility: Secondary | ICD-10-CM | POA: Diagnosis present

## 2017-12-12 DIAGNOSIS — S81001S Unspecified open wound, right knee, sequela: Secondary | ICD-10-CM

## 2017-12-12 DIAGNOSIS — L03115 Cellulitis of right lower limb: Secondary | ICD-10-CM | POA: Diagnosis not present

## 2017-12-12 DIAGNOSIS — E876 Hypokalemia: Secondary | ICD-10-CM | POA: Diagnosis present

## 2017-12-12 DIAGNOSIS — G546 Phantom limb syndrome with pain: Secondary | ICD-10-CM | POA: Diagnosis not present

## 2017-12-12 DIAGNOSIS — E039 Hypothyroidism, unspecified: Secondary | ICD-10-CM | POA: Diagnosis present

## 2017-12-12 DIAGNOSIS — E871 Hypo-osmolality and hyponatremia: Secondary | ICD-10-CM | POA: Diagnosis present

## 2017-12-12 DIAGNOSIS — E43 Unspecified severe protein-calorie malnutrition: Secondary | ICD-10-CM | POA: Diagnosis present

## 2017-12-12 DIAGNOSIS — R52 Pain, unspecified: Secondary | ICD-10-CM | POA: Diagnosis not present

## 2017-12-12 DIAGNOSIS — K5900 Constipation, unspecified: Secondary | ICD-10-CM | POA: Diagnosis present

## 2017-12-12 DIAGNOSIS — K219 Gastro-esophageal reflux disease without esophagitis: Secondary | ICD-10-CM | POA: Diagnosis present

## 2017-12-12 DIAGNOSIS — T148XXA Other injury of unspecified body region, initial encounter: Secondary | ICD-10-CM | POA: Diagnosis not present

## 2017-12-12 DIAGNOSIS — Z4781 Encounter for orthopedic aftercare following surgical amputation: Secondary | ICD-10-CM | POA: Diagnosis not present

## 2017-12-12 DIAGNOSIS — R488 Other symbolic dysfunctions: Secondary | ICD-10-CM | POA: Diagnosis not present

## 2017-12-12 DIAGNOSIS — A419 Sepsis, unspecified organism: Secondary | ICD-10-CM | POA: Diagnosis not present

## 2017-12-12 DIAGNOSIS — M79604 Pain in right leg: Secondary | ICD-10-CM | POA: Diagnosis not present

## 2017-12-12 DIAGNOSIS — I70261 Atherosclerosis of native arteries of extremities with gangrene, right leg: Secondary | ICD-10-CM | POA: Diagnosis not present

## 2017-12-12 DIAGNOSIS — Z79899 Other long term (current) drug therapy: Secondary | ICD-10-CM

## 2017-12-12 DIAGNOSIS — M79609 Pain in unspecified limb: Secondary | ICD-10-CM | POA: Diagnosis not present

## 2017-12-12 DIAGNOSIS — N183 Chronic kidney disease, stage 3 unspecified: Secondary | ICD-10-CM | POA: Diagnosis present

## 2017-12-12 DIAGNOSIS — Z4789 Encounter for other orthopedic aftercare: Secondary | ICD-10-CM | POA: Diagnosis not present

## 2017-12-12 DIAGNOSIS — Z9841 Cataract extraction status, right eye: Secondary | ICD-10-CM

## 2017-12-12 DIAGNOSIS — Z881 Allergy status to other antibiotic agents status: Secondary | ICD-10-CM

## 2017-12-12 DIAGNOSIS — A408 Other streptococcal sepsis: Secondary | ICD-10-CM | POA: Diagnosis not present

## 2017-12-12 DIAGNOSIS — Z89611 Acquired absence of right leg above knee: Secondary | ICD-10-CM | POA: Diagnosis not present

## 2017-12-12 DIAGNOSIS — R2681 Unsteadiness on feet: Secondary | ICD-10-CM | POA: Diagnosis not present

## 2017-12-12 DIAGNOSIS — D649 Anemia, unspecified: Secondary | ICD-10-CM | POA: Diagnosis not present

## 2017-12-12 DIAGNOSIS — B999 Unspecified infectious disease: Secondary | ICD-10-CM | POA: Diagnosis not present

## 2017-12-12 DIAGNOSIS — S81001D Unspecified open wound, right knee, subsequent encounter: Secondary | ICD-10-CM | POA: Diagnosis not present

## 2017-12-12 DIAGNOSIS — Z791 Long term (current) use of non-steroidal anti-inflammatories (NSAID): Secondary | ICD-10-CM

## 2017-12-12 DIAGNOSIS — Z9842 Cataract extraction status, left eye: Secondary | ICD-10-CM

## 2017-12-12 DIAGNOSIS — I878 Other specified disorders of veins: Secondary | ICD-10-CM | POA: Diagnosis present

## 2017-12-12 DIAGNOSIS — Z87891 Personal history of nicotine dependence: Secondary | ICD-10-CM

## 2017-12-12 DIAGNOSIS — I129 Hypertensive chronic kidney disease with stage 1 through stage 4 chronic kidney disease, or unspecified chronic kidney disease: Secondary | ICD-10-CM | POA: Diagnosis present

## 2017-12-12 DIAGNOSIS — Z91040 Latex allergy status: Secondary | ICD-10-CM

## 2017-12-12 DIAGNOSIS — L0390 Cellulitis, unspecified: Secondary | ICD-10-CM | POA: Diagnosis not present

## 2017-12-12 DIAGNOSIS — Z961 Presence of intraocular lens: Secondary | ICD-10-CM | POA: Diagnosis present

## 2017-12-12 DIAGNOSIS — Z9181 History of falling: Secondary | ICD-10-CM | POA: Diagnosis not present

## 2017-12-12 DIAGNOSIS — R0600 Dyspnea, unspecified: Secondary | ICD-10-CM | POA: Diagnosis not present

## 2017-12-12 DIAGNOSIS — Z882 Allergy status to sulfonamides status: Secondary | ICD-10-CM

## 2017-12-12 DIAGNOSIS — Z888 Allergy status to other drugs, medicaments and biological substances status: Secondary | ICD-10-CM

## 2017-12-12 DIAGNOSIS — Z7982 Long term (current) use of aspirin: Secondary | ICD-10-CM

## 2017-12-12 DIAGNOSIS — D508 Other iron deficiency anemias: Secondary | ICD-10-CM | POA: Diagnosis not present

## 2017-12-12 DIAGNOSIS — M25561 Pain in right knee: Secondary | ICD-10-CM | POA: Diagnosis not present

## 2017-12-12 DIAGNOSIS — S81801S Unspecified open wound, right lower leg, sequela: Secondary | ICD-10-CM

## 2017-12-12 DIAGNOSIS — Z79890 Hormone replacement therapy (postmenopausal): Secondary | ICD-10-CM

## 2017-12-12 DIAGNOSIS — L97819 Non-pressure chronic ulcer of other part of right lower leg with unspecified severity: Secondary | ICD-10-CM | POA: Diagnosis present

## 2017-12-12 DIAGNOSIS — I96 Gangrene, not elsewhere classified: Secondary | ICD-10-CM | POA: Diagnosis present

## 2017-12-12 DIAGNOSIS — Z880 Allergy status to penicillin: Secondary | ICD-10-CM

## 2017-12-12 DIAGNOSIS — I70202 Unspecified atherosclerosis of native arteries of extremities, left leg: Secondary | ICD-10-CM | POA: Diagnosis present

## 2017-12-12 DIAGNOSIS — M7989 Other specified soft tissue disorders: Secondary | ICD-10-CM | POA: Diagnosis not present

## 2017-12-12 DIAGNOSIS — L97829 Non-pressure chronic ulcer of other part of left lower leg with unspecified severity: Secondary | ICD-10-CM | POA: Diagnosis present

## 2017-12-12 DIAGNOSIS — M1990 Unspecified osteoarthritis, unspecified site: Secondary | ICD-10-CM | POA: Diagnosis present

## 2017-12-12 DIAGNOSIS — I739 Peripheral vascular disease, unspecified: Secondary | ICD-10-CM | POA: Diagnosis not present

## 2017-12-12 DIAGNOSIS — M6281 Muscle weakness (generalized): Secondary | ICD-10-CM | POA: Diagnosis not present

## 2017-12-12 DIAGNOSIS — R262 Difficulty in walking, not elsewhere classified: Secondary | ICD-10-CM | POA: Diagnosis not present

## 2017-12-12 DIAGNOSIS — Z8249 Family history of ischemic heart disease and other diseases of the circulatory system: Secondary | ICD-10-CM

## 2017-12-12 LAB — URINALYSIS, ROUTINE W REFLEX MICROSCOPIC
Bilirubin Urine: NEGATIVE
Glucose, UA: NEGATIVE mg/dL
Hgb urine dipstick: NEGATIVE
KETONES UR: NEGATIVE mg/dL
Nitrite: POSITIVE — AB
PROTEIN: NEGATIVE mg/dL
Specific Gravity, Urine: 1.02 (ref 1.005–1.030)
pH: 5 (ref 5.0–8.0)

## 2017-12-12 LAB — COMPREHENSIVE METABOLIC PANEL
ALBUMIN: 2.5 g/dL — AB (ref 3.5–5.0)
ALT: 13 U/L — AB (ref 14–54)
AST: 18 U/L (ref 15–41)
Alkaline Phosphatase: 69 U/L (ref 38–126)
Anion gap: 8 (ref 5–15)
BUN: 18 mg/dL (ref 6–20)
CHLORIDE: 101 mmol/L (ref 101–111)
CO2: 24 mmol/L (ref 22–32)
Calcium: 8.6 mg/dL — ABNORMAL LOW (ref 8.9–10.3)
Creatinine, Ser: 1.38 mg/dL — ABNORMAL HIGH (ref 0.44–1.00)
GFR calc non Af Amer: 35 mL/min — ABNORMAL LOW (ref >60)
GFR, EST AFRICAN AMERICAN: 41 mL/min — AB (ref >60)
GLUCOSE: 117 mg/dL — AB (ref 65–99)
Potassium: 3.5 mmol/L (ref 3.5–5.1)
SODIUM: 133 mmol/L — AB (ref 135–145)
Total Bilirubin: 0.2 mg/dL — ABNORMAL LOW (ref 0.3–1.2)
Total Protein: 6.3 g/dL — ABNORMAL LOW (ref 6.5–8.1)

## 2017-12-12 LAB — CBC WITH DIFFERENTIAL/PLATELET
BASOS PCT: 0 %
Basophils Absolute: 0 10*3/uL (ref 0.0–0.1)
EOS PCT: 3 %
Eosinophils Absolute: 0.4 10*3/uL (ref 0.0–0.7)
HCT: 21.9 % — ABNORMAL LOW (ref 36.0–46.0)
HEMOGLOBIN: 6.7 g/dL — AB (ref 12.0–15.0)
Lymphocytes Relative: 10 %
Lymphs Abs: 1.5 10*3/uL (ref 0.7–4.0)
MCH: 24.4 pg — AB (ref 26.0–34.0)
MCHC: 30.6 g/dL (ref 30.0–36.0)
MCV: 79.6 fL (ref 78.0–100.0)
Monocytes Absolute: 1.6 10*3/uL — ABNORMAL HIGH (ref 0.1–1.0)
Monocytes Relative: 11 %
NEUTROS PCT: 76 %
Neutro Abs: 11 10*3/uL — ABNORMAL HIGH (ref 1.7–7.7)
Platelets: 459 10*3/uL — ABNORMAL HIGH (ref 150–400)
RBC: 2.75 MIL/uL — ABNORMAL LOW (ref 3.87–5.11)
RDW: 16.9 % — ABNORMAL HIGH (ref 11.5–15.5)
WBC: 14.5 10*3/uL — ABNORMAL HIGH (ref 4.0–10.5)

## 2017-12-12 LAB — PREPARE RBC (CROSSMATCH)

## 2017-12-12 LAB — I-STAT CG4 LACTIC ACID, ED: Lactic Acid, Venous: 0.61 mmol/L (ref 0.5–1.9)

## 2017-12-12 LAB — ABO/RH: ABO/RH(D): A POS

## 2017-12-12 SURGERY — AMPUTATION, ABOVE KNEE
Anesthesia: General | Laterality: Right

## 2017-12-12 MED ORDER — MORPHINE SULFATE (PF) 4 MG/ML IV SOLN
4.0000 mg | Freq: Once | INTRAVENOUS | Status: AC
  Administered 2017-12-12: 4 mg via INTRAVENOUS
  Filled 2017-12-12: qty 1

## 2017-12-12 MED ORDER — HYDROMORPHONE HCL 1 MG/ML IJ SOLN
1.0000 mg | Freq: Once | INTRAMUSCULAR | Status: AC
  Administered 2017-12-12: 1 mg via INTRAVENOUS
  Filled 2017-12-12: qty 1

## 2017-12-12 MED ORDER — SODIUM CHLORIDE 0.9 % IV BOLUS (SEPSIS)
1000.0000 mL | Freq: Once | INTRAVENOUS | Status: AC
  Administered 2017-12-12: 1000 mL via INTRAVENOUS

## 2017-12-12 MED ORDER — OXYCODONE-ACETAMINOPHEN 5-325 MG PO TABS
1.0000 | ORAL_TABLET | ORAL | Status: DC | PRN
  Administered 2017-12-12 – 2017-12-17 (×13): 1 via ORAL
  Filled 2017-12-12 (×13): qty 1

## 2017-12-12 MED ORDER — VANCOMYCIN HCL IN DEXTROSE 1-5 GM/200ML-% IV SOLN
1000.0000 mg | Freq: Once | INTRAVENOUS | Status: AC
  Administered 2017-12-12: 1000 mg via INTRAVENOUS
  Filled 2017-12-12: qty 200

## 2017-12-12 MED ORDER — LEVOTHYROXINE SODIUM 75 MCG PO TABS
175.0000 ug | ORAL_TABLET | Freq: Every day | ORAL | Status: DC
  Administered 2017-12-13 – 2017-12-23 (×10): 175 ug via ORAL
  Filled 2017-12-12 (×10): qty 1

## 2017-12-12 MED ORDER — FAMOTIDINE 20 MG PO TABS
40.0000 mg | ORAL_TABLET | Freq: Every day | ORAL | Status: DC
  Administered 2017-12-13 – 2017-12-23 (×10): 40 mg via ORAL
  Filled 2017-12-12 (×13): qty 2

## 2017-12-12 MED ORDER — VANCOMYCIN HCL 10 G IV SOLR: 1250.0000 mg | INTRAVENOUS | Status: DC

## 2017-12-12 MED ORDER — SENNOSIDES-DOCUSATE SODIUM 8.6-50 MG PO TABS: 1.0000 | ORAL_TABLET | Freq: Every evening | ORAL | Status: DC | PRN

## 2017-12-12 MED ORDER — ONDANSETRON HCL 4 MG/2ML IJ SOLN
4.0000 mg | Freq: Once | INTRAMUSCULAR | Status: DC
  Filled 2017-12-12 (×2): qty 2

## 2017-12-12 MED ORDER — SODIUM CHLORIDE 0.9 % IV SOLN
INTRAVENOUS | Status: AC
  Administered 2017-12-13: via INTRAVENOUS

## 2017-12-12 MED ORDER — VANCOMYCIN HCL IN DEXTROSE 1-5 GM/200ML-% IV SOLN: 1000.0000 mg | INTRAVENOUS | Status: AC

## 2017-12-12 MED ORDER — CLINDAMYCIN PHOSPHATE 600 MG/50ML IV SOLN
600.0000 mg | Freq: Once | INTRAVENOUS | Status: AC
  Administered 2017-12-12: 600 mg via INTRAVENOUS
  Filled 2017-12-12: qty 50

## 2017-12-12 MED ORDER — DEXTROSE 5 % IV SOLN: 2.0000 g | INTRAVENOUS | Status: DC

## 2017-12-12 MED ORDER — ONDANSETRON HCL 4 MG/2ML IJ SOLN: 4.0000 mg | Freq: Four times a day (QID) | INTRAMUSCULAR | Status: DC | PRN

## 2017-12-12 MED ORDER — SODIUM CHLORIDE 0.9 % IV SOLN
Freq: Once | INTRAVENOUS | Status: AC
  Administered 2017-12-12: 19:00:00 via INTRAVENOUS

## 2017-12-12 MED ORDER — ONDANSETRON HCL 4 MG PO TABS: 4.0000 mg | ORAL_TABLET | Freq: Four times a day (QID) | ORAL | Status: DC | PRN

## 2017-12-12 MED ORDER — METRONIDAZOLE IN NACL 5-0.79 MG/ML-% IV SOLN
500.0000 mg | Freq: Three times a day (TID) | INTRAVENOUS | Status: DC
  Administered 2017-12-13 – 2017-12-23 (×31): 500 mg via INTRAVENOUS
  Filled 2017-12-12 (×35): qty 100

## 2017-12-12 MED ORDER — OXYCODONE-ACETAMINOPHEN 5-325 MG PO TABS
1.0000 | ORAL_TABLET | ORAL | Status: DC
  Administered 2017-12-12: 1 via ORAL
  Filled 2017-12-12: qty 1

## 2017-12-12 NOTE — H&P (Signed)
History and Physical  Shelby Reese:[id number] DOB: 02/24/1935 DOA: 12/12/2017  Referring physician: Dr. Gilford Raid PCP: Shelby Pretty, MD  Outpatient Specialists: Dr. Sharol Reese (orthopedic) Patient coming from:SNF (Shelby Re[facility])  Chief Complaint: worsening right leg pain   HPI: Shelby Reese is a 83 y.o. female with medical history significant for PVD and persistent ulceration of right knee (medially) who underwent limb salvage intervention with debridement, skin graft therapy and wound VAC therapy most recently on 12/05/2017  who presents on 12/12/2017 after evaluation from physician at her SNF with worsening right knee pain and malodorous drainage was found to be septic secondary to the right knee wound.  Patient was previously evaluated in the ED on 12/09/17 for increased drainage with foul odor.  ED physicians did not visualize wound due to postoperative dressings, lab work and vitals at the time were stable and unremarkable so plans were made for patient to follow-up with her orthopedic surgeon on 1/24.  Dr. Sharol Reese evaluated Shelby Reese on 1/24.  At that time patient stated she cannot tolerate continued compression therapy and current wound care.  Initial plan was for patient to undergo an AKA however patient then requested to cancel surgery as she was seeking a second opinion on wound care.  Prior to this admission, patient was evaluated by her SNF physician Dr. Sheppard Coil.  It was noted the patient's right leg had become more tender, requiring increase pain medications, increased redness, and more frequent malodorous drainage prompting patient's referral to the ED for further evaluation.   ED Course: T-max 99.9, blood pressure 99/45, normal oxygen saturation, heart rate 74.  WBC 14.5, hemoglobin 6.7, creatinine 1.38, albumin 2.5, glucose 117. X-ray right knee was obtained which showed no acute abnormalities.  Vancomycin and clindamycin were Reese in the ED.  Pain control with IV Dilaudid  and morphine.  Patient was Reese 1 L normal saline bolus.   Review of Systems:As mentioned in the history of present illness.Review of systems are otherwise negative Patient seen in the ED.    Past Medical History:  Diagnosis Date  . Allergy   . Anemia   . CKD (chronic kidney disease), stage II   . Fibromyalgia   . GERD (gastroesophageal reflux disease)   . Hiatal hernia   . Hypertension   . Hypothyroid   . Lymphedema    venous insufficency  . Osteoarthritis   . Peripheral vascular disease ([place])   . Ulcer of knee ([place])    right  . Urinary incontinence   . Varicose veins   . Venous insufficiency    Past Surgical History:  Procedure Laterality Date  . APPLICATION OF WOUND VAC Right 07/18/2017   Procedure: APPLICATION OF WOUND VAC;  Surgeon: Shelby Minion, MD;  Location: [facility];  Service: Orthopedics;  Laterality: Right;  . CATARACT EXTRACTION W/ INTRAOCULAR LENS  IMPLANT, BILATERAL    . COLONOSCOPY    . I&D EXTREMITY Right 07/18/2017   Procedure: IRRIGATION AND DEBRIDEMENT RIGHT KNEE;  Surgeon: Shelby Minion, MD;  Location: [place] [facility];  Service: Orthopedics;  Laterality: Right;  . I&D EXTREMITY Right 07/23/2017   Procedure: REPEAT IRRIGATION AND DEBRIDEMENT RIGHT KNEE;  Surgeon: Shelby Minion, MD;  Location: [place];  Service: Orthopedics;  Laterality: Right;  . I&D EXTREMITY Right 11/28/2017   Procedure: IRRIGATION AND DEBRIDEMENT RIGHT KNEE , APPLY INSTILLATION VAC;  Surgeon: Shelby Minion, MD;  Location: [facility];  Service: Orthopedics;  Laterality: Right;  . I&D EXTREMITY Right  12/03/2017   Procedure: REPEAT IRRIGATION AND DEBRIDEMENT RIGHT KNEE AND APPLICATION OF A WOUND VAC.;  Surgeon: Shelby Minion, MD;  Location: [facility];  Service: Orthopedics;  Laterality: Right;  . KNEE ARTHROSCOPY Left    menisectomy  . MULTIPLE TOOTH EXTRACTIONS    . SKIN SPLIT GRAFT Right 07/25/2017   Procedure: Repeat Irrigation and Debridement Right Knee, Split Thickness Skin Graft;  Surgeon: Shelby Minion,  MD;  Location: [facility];  Service: Orthopedics;  Laterality: Right;  . SKIN SPLIT GRAFT Right 12/05/2017   Procedure: SKIN GRAFT SPLIT THICKNESS WOUND KNEE, APPLY VAC;  Surgeon: Shelby Minion, MD;  Location: [place];  Service: Orthopedics;  Laterality: Right;  . TONSILLECTOMY      Social History:  reports that she quit smoking about 38 years ago. She has a 5.00 pack-year smoking history. she has never used smokeless tobacco. She reports that she drinks about 8.4 oz of alcohol per week. She reports that she does not use drugs.   Allergies  Allergen Reactions  . Cinnamon Hives  . Ciprofloxacin Other (See Comments)    TREMORS  . Diovan [Valsartan] Other (See Comments)    Extreme vertigo  . Food Diarrhea and Other (See Comments)    ORANGE JUICE   UPSET STOMACH  . Latex Rash and Other (See Comments)    Rash/inflammation due to exposure  . Nitrofuran Derivatives Hives and Rash    "Full body rash"  . Penicillins Hives and Swelling    *tolerated Ceftriaxone September 2018 Has patient had a PCN reaction causing immediate rash, facial/tongue/throat swelling, SOB or lightheadedness with hypotension:No--severe irritation at the injection site Has patient had a PCN reaction causing severe rash involving mucus membranes or skin necrosis:Unknown Has patient had a PCN reaction that required hospitalization:No Has patient had a PCN reaction occurring within the last 10 years:Yes If all of the above answers are "NO", then may proceed with  . Bactrim [Sulfamethoxazole-Trimethoprim] Diarrhea and Nausea Only  . Other Rash    Mycins  . Sulfa Antibiotics Diarrhea and Nausea Only    Family History  Problem Relation Age of Onset  . Stroke Mother   . Varicose Veins Mother   . Cancer Father        prostate  . Stroke Sister   . Heart disease Sister   . Varicose Veins Sister   . Stroke Maternal Grandmother   . Varicose Veins Sister       Prior to Admission medications   Medication Sig Start Date End  Date Taking? Authorizing Provider  acetaminophen (TYLENOL) 500 MG tablet Take 1,000 mg by mouth daily.   Yes [provider]  aspirin EC 81 MG tablet Take 81 mg by mouth daily with breakfast.   Yes [provider]  benazepril (LOTENSIN) 40 MG tablet Take 40 mg by mouth daily with breakfast.  03/26/13  Yes [provider]  Calcium Carbonate-Vitamin D (CALCIUM 600+D) 600-400 MG-UNIT per tablet Take 1 tablet by mouth daily at 3 pm.    Yes [provider]  cetirizine (ZYRTEC) 10 MG tablet Take 10 mg by mouth at bedtime.    Yes [provider]  diltiazem (TIAZAC) 120 MG 24 hr capsule Take 120 mg by mouth daily with breakfast.  01/29/13  Yes [provider]  diphenhydrAMINE (BENADRYL) 50 MG capsule Take 50 mg by mouth every 6 (six) hours as needed (for sneezing).    Yes [provider]  famotidine (PEPCID) 40 MG tablet Take  40 mg by mouth daily at 3 pm. 1600   Yes [provider]  ferrous sulfate 325 (65 FE) MG tablet Take 325 mg by mouth 2 (two) times daily with a meal.   Yes [provider]  hydrALAZINE (APRESOLINE) 25 MG tablet Take 1 tablet (25 mg total) by mouth every 8 (eight) hours. 09/04/17  Yes Debbe Odea, MD  ibuprofen (ADVIL,MOTRIN) 200 MG tablet Take 400 mg by mouth daily.   Yes [provider]  Magnesium Oxide (MAG-200 PO) Take 200 mg by mouth daily at 3 pm.   Yes [provider]  Multiple Vitamin (MULTIVITAMIN WITH MINERALS) TABS tablet Take 1 tablet by mouth daily.   Yes [provider]  NATURAL PSYLLIUM FIBER PO Take 1 capsule by mouth daily.   Yes [provider]  oxyCODONE-acetaminophen (PERCOCET/ROXICET) 5-325 MG tablet Take 1 tablet by mouth every 4 (four) hours. Scheduled, and 1 by mouth every 6 hours as needed for pain x 14 days starting 12/11/17   Yes [provider]  SYNTHROID 175 MCG tablet Take 175 mcg by mouth daily before breakfast.  09/20/15  Yes [provider]  vitamin C (ASCORBIC ACID) 500 MG tablet Take 500 mg by mouth daily.   Yes [provider]  Gauze Pads & Dressings (KERLIX GAUZE ROLL LARGE) MISC 1 Units by Does not apply route 4 (four) times daily. 07/14/17   Suzan Slick, NP  Gauze Pads & Dressings 4"X4-1/2" PADS 2 Units by Does not apply route 4 (four) times daily. 07/14/17   Suzan Slick, NP    Physical Exam: BP (!) 123/42   Pulse 75   Temp 99.9 F (37.7 C) (Oral)   Resp 14   Ht 5\' 3"  (1.6 m)   Wt 103 kg (227 lb)   SpO2 97%   BMI 40.21 kg/m   General: Lying in bed in Appe[place], in obvious discomfort but no acute distress Eyes: EOM ENT: Dry oral mucosa Cardiovascular: regular rate and rhythm, no appreciable murmurs rubs or gallops.  Respiratory: Normal respiratory effort, clear breath sounds on auscultation bilaterally, no rales wheezes or rhonchi Abdomen: soft, non-distended, non-tender, normal bowel sounds, no guarding or rebound tenderness Skin: Large ulcerated lesion involving medial side of right knee extending slightly above and below into the high calf.  Surrounded by blanching erythema (involving right knee close ( inferior part of thigh, distal part of calf), malodorous, no obvious draining.  Left leg wrapped in dressings. Neurologic: Grossly no focal neuro deficit.Mental status AAOx3, speech normal, Psychiatric:Appropriate affect, and mood          Labs on Admission:  Basic Metabolic Panel: Recent Labs  Lab 12/11/17 12/12/17 1527  NA 133* 133*  K 4.0 3.5  CL  --  101  CO2  --  24  GLUCOSE  --  117*  BUN 16 18  CREATININE 1.4* 1.38*  CALCIUM  --  8.6*   Liver Function Tests: Recent Labs  Lab 12/12/17 1527  AST 18  ALT 13*  ALKPHOS 69  BILITOT 0.2*  PROT 6.3*  ALBUMIN 2.5*   No results for input(s): LIPASE, AMYLASE in the last 168 hours. No results for input(s): AMMONIA in the last 168 hours. CBC: Recent Labs  Lab 12/11/17 12/12/17 1527  WBC 13.3 14.5*  NEUTROABS 10 11.0*   HGB 7.2* 6.7*  HCT 23* 21.9*  MCV  --  79.6  PLT 498* 459*   Cardiac Enzymes: No results for input(s): CKTOTAL, CKMB,  CKMBINDEX, TROPONINI in the last 168 hours.  BNP (last 3 results) No results for input(s): BNP in the last 8760 hours.  ProBNP (last 3 results) No results for input(s): PROBNP in the last 8760 hours.  CBG: Recent Labs  Lab 12/08/17 1141  GLUCAP 88    Radiological Exams on Admission: Dg Knee Complete 4 Views Right  Result Date: 12/12/2017 CLINICAL DATA:  Increased pain, swelling and redness of RIGHT leg EXAM: RIGHT KNEE - COMPLETE 4+ VIEW COMPARISON:  11/12/2017 FINDINGS: Numerous skin clips at medial aspect of RIGHT leg post recent wound management and split thickness skin grafting. Osseous mineralization normal. Medial compartment joint space narrowing. No acute fracture, dislocation, or bone destruction. No knee joint effusion. IMPRESSION: Degenerative changes RIGHT knee. No acute bony abnormalities. Electronically Signed   By: Lavonia Dana M.D.   On: 12/12/2017 17:15    Assessment/Plan Present on Admission: . Sepsis ([place]) . CKD (chronic kidney disease), stage III (HCC)  Principal Problem:   Sepsis ([place]) Active Problems:   Iron deficiency anemia   CKD (chronic kidney disease), stage III (HCC)   Wound of right leg, sequela   Sepsis secondary to right knee wound infection Patient status post multiple irrigation and debridements, and previous wound VAC (removed recently).  Low-grade fever, leukocytosis, and likely source meet criteria for sepsis. -Wound protocol: Follow-up blood cultures, continue empiric antibiotics: Vancomycin, Flagyl, cefepime (noted penicillin allergy however patient received cephalosporins in the past with no reactions) -The ED physician, Dr. Gilford Raid spoke with vascular surgeon Dr. Oneida Alar who agreed to consult.   -Please let Dr. Oneida Alar no patient has [facility] per his request, plan for likely AKA -N.p.o. for now suspect no surgery  until hemoglobin remained stable as mentioned below -Judicious pain control, continue home oxycodone as needed use  Acute on chronic anemia, iron deficiency Hemoglobin on admission 6.7.  Patient receiving blood transition currently. No signs or symptoms of current bleeding -Will need to follow-up hemoglobin after completed transfusion -Hold home oral iron in the setting of active infection  Hypertension, relative hypotension, likely related to sepsis - Lactic acid within normal limits.  Status post 1 L bolus normal saline in the ED -Start maintenance fluids.  Hold off on home antihypertensives.  Continue to monitor  CKD stage III, stable - Baseline creatinine 1.4-1.6.  Current current creatinine 1.38 consistent with baseline. -Avoid nephrotoxins, continue to monitor    DVT prophylaxis:SCDs  Code Status: Full COde   Family Communication: No Family present   [facility] plan for AKA  Consults called: Vascular (by ED)   Admission status: Admitted as inpatient      Desiree Hane MD [facility]  Pager [phone]  If 7PM-7AM, please contact night-coverage [email or web address] Password [facility]  12/12/2017, 8:44 PM

## 2017-12-12 NOTE — Progress Notes (Signed)
Provider: Noah Delaine. Sheppard Coil, MD  Location:  [facility] Room Number: 081-K Place of Service:  SNF [phone])  [name], MD  Patient Care Team: [name], MD as PCP - General (Internal Medicine) Carolan Clines, MD as Attending Physician (Urology) Charlies Silvers, MD as Consulting Physician (Allergy)  Extended Emergency Contact Information Primary Emergency Contact: Beeck,Michael Address: [address]          [place], [place] [address] Johnnette Litter of [place] Phone: [phone] Relation: Spouse    Allergies: Cinnamon; Ciprofloxacin; Diovan [valsartan]; Food; Latex; Nitrofuran derivatives; Penicillins; Bactrim [sulfamethoxazole-trimethoprim]; Other; and Sulfa antibiotics  Chief Complaint  Patient presents with  . Acute Visit    cellulitis BLE    HPI: Patient is 82 y.o. female  with PVD and history of large soft tissue wound medial right knee with irrigation and debridement of right knee and skin grafting with wound VAC and 06/2017 by Dr. Sharol Given with subsequent failed surgical management with repeat irrigation and debridement and re-application of wound VAC on 12/05/2017 by Dr. Sharol Given. Patient was admitted to skilled nursing facility 12/08/17 with wound VAC in place. Patient was seen in the ED on Wednesday 12/10/17 due to increased drainage with foul odor; ED physician did not visualize wound or skin secondary to postoperative dressings, and plans were made for patient to see Dr. Sharol Given on Thursday for Junie Panning, Dr. Jess Barters nurse practitioner. Patient was seen by Dr. Sharol Given on Thursday 1/24 where he removed the wound VAC and recommended a right AKA for Friday 12/12/17. Patient declined and return to skilled nursing facility. She declined because she did not wish Dr. Sharol Given to perform the surgery, per my conversation with her today. Since Thursday patient's right leg has become exquisitely tender, requiring increased pain medications, and more red and warm than it  was yesterday. Malodorous drainage continues.  Past Medical History:  Diagnosis Date  . Allergy   . Anemia   . CKD (chronic kidney disease), stage II   . Fibromyalgia   . GERD (gastroesophageal reflux disease)   . Hiatal hernia   . Hypertension   . Hypothyroid   . Lymphedema    venous insufficency  . Osteoarthritis   . Peripheral vascular disease ([place])   . Ulcer of knee ([place])    right  . Urinary incontinence   . Varicose veins   . Venous insufficiency     Past Surgical History:  Procedure Laterality Date  . APPLICATION OF WOUND VAC Right 07/18/2017   Procedure: APPLICATION OF WOUND VAC;  Surgeon: [name], MD;  Location: [facility];  Service: Orthopedics;  Laterality: Right;  . CATARACT EXTRACTION W/ INTRAOCULAR LENS  IMPLANT, BILATERAL    . COLONOSCOPY    . I&D EXTREMITY Right 07/18/2017   Procedure: IRRIGATION AND DEBRIDEMENT RIGHT KNEE;  Surgeon: [name], MD;  Location: [facility];  Service: Orthopedics;  Laterality: Right;  . I&D EXTREMITY Right 07/23/2017   Procedure: REPEAT IRRIGATION AND DEBRIDEMENT RIGHT KNEE;  Surgeon: [name], MD;  Location: [facility];  Service: Orthopedics;  Laterality: Right;  . I&D EXTREMITY Right 11/28/2017   Procedure: IRRIGATION AND DEBRIDEMENT RIGHT KNEE , APPLY INSTILLATION VAC;  Surgeon: [name], MD;  Location: [facility];  Service: Orthopedics;  Laterality: Right;  . I&D EXTREMITY Right 12/03/2017   Procedure: REPEAT IRRIGATION AND DEBRIDEMENT RIGHT KNEE AND APPLICATION OF A WOUND VAC.;  Surgeon: [name], MD;  Location: [facility];  Service:  Orthopedics;  Laterality: Right;  . KNEE ARTHROSCOPY Left    menisectomy  . MULTIPLE TOOTH EXTRACTIONS    . SKIN SPLIT GRAFT Right 07/25/2017   Procedure: Repeat Irrigation and Debridement Right Knee, Split Thickness Skin Graft;  Surgeon: [name], MD;  Location: [facility];  Service: Orthopedics;  Laterality: Right;  . SKIN SPLIT GRAFT Right 12/05/2017   Procedure: SKIN GRAFT SPLIT THICKNESS WOUND  KNEE, APPLY VAC;  Surgeon: [name], MD;  Location: [facility];  Service: Orthopedics;  Laterality: Right;  . TONSILLECTOMY      Allergies as of 12/12/2017      Reactions   Cinnamon Hives   Ciprofloxacin Other (See Comments)   TREMORS   Diovan [valsartan] Other (See Comments)   Extreme vertigo   Food Diarrhea, Other (See Comments)   ORANGE JUICE   UPSET STOMACH   Latex Rash, Other (See Comments)   Rash/inflammation due to exposure   Nitrofuran Derivatives Hives, Rash   "Full body rash"   Penicillins Hives, Swelling   *tolerated Ceftriaxone September 2018 Has patient had a PCN reaction causing immediate rash, facial/tongue/throat swelling, SOB or lightheadedness with hypotension:No--severe irritation at the injection site Has patient had a PCN reaction causing severe rash involving mucus membranes or skin necrosis:Unknown Has patient had a PCN reaction that required hospitalization:No Has patient had a PCN reaction occurring within the last 10 years:Yes If all of the above answers are "NO", then may proceed with   Bactrim [sulfamethoxazole-trimethoprim] Diarrhea, Nausea Only   Other Rash   Mycins   Sulfa Antibiotics Diarrhea, Nausea Only      Medication List        Accurate as of 12/12/17 12:19 PM. Always use your most recent med list.          acetaminophen 500 MG tablet Commonly known as:  TYLENOL Take 1,000 mg by mouth daily.   aspirin EC 81 MG tablet Take 81 mg by mouth daily with breakfast.   benazepril 40 MG tablet Commonly known as:  LOTENSIN Take 40 mg by mouth daily with breakfast.   CALCIUM 600+D 600-400 MG-UNIT tablet Generic drug:  Calcium Carbonate-Vitamin D Take 1 tablet by mouth daily at 3 pm.   cetirizine 10 MG tablet Commonly known as:  ZYRTEC Take 10 mg by mouth at bedtime.   diltiazem 120 MG 24 hr capsule Commonly known as:  TIAZAC Take 120 mg by mouth daily with breakfast.   diphenhydrAMINE 25 mg capsule Commonly known as:   BENADRYL Take 50 mg by mouth every 6 (six) hours.   famotidine 40 MG tablet Commonly known as:  PEPCID Take 40 mg by mouth daily at 3 pm. 1600   ferrous sulfate 325 (65 FE) MG tablet Take 325 mg by mouth 2 (two) times daily with a meal.   Gauze Pads & Dressings 4"X4-1/2" Pads 2 Units by Does not apply route 4 (four) times daily.   KERLIX GAUZE ROLL LARGE Misc 1 Units by Does not apply route 4 (four) times daily.   hydrALAZINE 25 MG tablet Commonly known as:  APRESOLINE Take 1 tablet (25 mg total) by mouth every 8 (eight) hours.   ibuprofen 200 MG tablet Commonly known as:  ADVIL,MOTRIN Take 400 mg by mouth daily.   MAG-200 PO Take 200 mg by mouth daily at 3 pm.   multivitamin with minerals Tabs tablet Take 1 tablet by mouth daily.   NATURAL PSYLLIUM FIBER PO Take 1 capsule by mouth daily.   oxyCODONE-acetaminophen  5-325 MG tablet Commonly known as:  PERCOCET/ROXICET Take 1 tablet by mouth every 4 (four) hours. Scheduled, and 1 by mouth every 6 hours as needed for pain x 14 days starting 12/11/17   SYNTHROID 175 MCG tablet Generic drug:  levothyroxine Take 175 mcg by mouth daily before breakfast.   vitamin C 500 MG tablet Commonly known as:  ASCORBIC ACID Take 500 mg by mouth daily.       No orders of the defined types were placed in this encounter.   Immunization History  Administered Date(s) Administered  . Influenza-Unspecified 12/08/2017  . Tdap 06/28/2015    Social History   Tobacco Use  . Smoking status: Former Smoker    Packs/day: 0.50    Years: 10.00    Pack years: 5.00    Last attempt to quit: 11/19/1979    Years since quitting: 38.0  . Smokeless tobacco: Never Used  Substance Use Topics  . Alcohol use: Yes    Alcohol/week: 8.4 oz    Types: 14 Glasses of wine per week    Comment: 2 glasses of wine with dinner    Review of Systems  DATA OBTAINED: from patient, nurse-both as per history of present illness GENERAL:  no fevers, fatigue,  appetite changes SKIN: Right thigh and calf are more red and swollen today than yesterday per nursing; left leg has some redness and heat also; malodorous drainage from large wound medial knee; very tender to touch removed HEENT: No complaint RESPIRATORY: No cough, wheezing, SOB CARDIAC: No chest pain, palpitations, lower extremity edema  GI: No abdominal pain, No N/V/D or constipation, No heartburn or reflux  GU: No dysuria, frequency or urgency, or incontinence  MUSCULOSKELETAL: No unrelieved bone/joint pain NEUROLOGIC: No headache, dizziness  PSYCHIATRIC: No overt anxiety or sadness  Vitals:   12/12/17 1102  BP: (!) 156/65  Pulse: 80  Resp: 20  Temp: 98.9 F (37.2 C)  SpO2: 98%   There is no height or weight on file to calculate BMI. Physical Exam  GENERAL APPEARANCE: Alert, conversant, appears uncomfortable,  SKIN: Redness and heat to medial right thigh and into right leg with heat; superficial wound of right leg noted; malodorous drainage and slough noted on active area of skin grafting where mesh is stable; left leg with which less redness and heat HEENT: Unremarkable RESPIRATORY: Breathing is even, unlabored. Lung sounds are clear   CARDIOVASCULAR: Heart RRR no murmurs, rubs or gallops. No peripheral edema  GASTROINTESTINAL: Abdomen is soft, non-tender, not distended w/ normal bowel sounds.  GENITOURINARY: Bladder non tender, not distended  MUSCULOSKELETAL: No abnormal joints or musculature NEUROLOGIC: Cranial nerves 2-12 grossly intact. Moves all extremities PSYCHIATRIC: Mood and affect appropriate to situation, patient is emotionally upset about the entire situation, no behavioral issues  Patient Active Problem List   Diagnosis Date Noted  . Open wound of right knee 11/28/2017  . Allergic drug rash   . Sepsis ([place]) 08/29/2017  . CKD (chronic kidney disease), stage III ([place]) 08/29/2017  . Hypothyroid 08/29/2017  . Hypertension 08/29/2017  . Peripheral vascular  disease ([place]) 08/29/2017  . Skin ulcer of knee, right, with fat layer exposed ([place]) 08/29/2017  . Acute kidney injury ([place]) 08/29/2017  . Metabolic acidosis 93/26/7124  . Acute hyponatremia 08/29/2017  . Pressure injury of skin 07/25/2017  . Wound, open, knee, lower leg, or ankle with complication, right, initial encounter   . Idiopathic chronic venous hypertension of both lower extremities with ulcer and inflammation ([place]) 07/15/2017  .  Skin ulcer of right knee with necrosis of muscle ([place]) 07/15/2017  . Atherosclerosis of artery of right lower extremity ([place]) 07/11/2017  . Essential hypertension 07/11/2017  . Osteoporosis 07/11/2017  . Cataracts, bilateral 07/11/2017  . Hypothyroidism, acquired 12/27/2014  . Anemia, iron deficiency 12/27/2014  . Hiatal hernia 12/27/2014  . Varicose veins of lower extremities with other complications 29/79/8921    CMP     Component Value Date/Time   NA 133 (A) 12/11/2017   NA 140 04/15/2013 1516   K 4.0 12/11/2017   K 4.3 04/15/2013 1516   CL 101 11/29/2017 0513   CL 105 04/15/2013 1516   CO2 22 11/29/2017 0513   CO2 25 04/15/2013 1516   GLUCOSE 155 (H) 11/29/2017 0513   GLUCOSE 94 04/15/2013 1516   BUN 16 12/11/2017   BUN 28.2 (H) 04/15/2013 1516   CREATININE 1.4 (A) 12/11/2017   CREATININE 1.36 (H) 11/29/2017 0513   CREATININE 1.21 (H) 12/25/2014 1530   CREATININE 1.4 (H) 04/15/2013 1516   CALCIUM 8.8 (L) 11/29/2017 0513   CALCIUM 9.9 04/15/2013 1516   PROT 8.0 09/09/2017 2137   PROT 7.8 04/15/2013 1516   ALBUMIN 3.9 09/09/2017 2137   ALBUMIN 3.7 04/15/2013 1516   AST 19 09/09/2017 2137   AST 17 04/15/2013 1516   ALT 24 09/09/2017 2137   ALT 15 04/15/2013 1516   ALKPHOS 61 09/09/2017 2137   ALKPHOS 73 04/15/2013 1516   BILITOT 0.6 09/09/2017 2137   BILITOT 0.25 04/15/2013 1516   GFRNONAA 36 (L) 11/29/2017 0513   GFRNONAA 43 (L) 12/25/2014 1530   GFRAA 41 (L) 11/29/2017 0513   GFRAA 50 (L) 12/25/2014 1530   Recent Labs     08/29/17 1756  11/12/17 1800 11/28/17 0955 11/29/17 0513 12/11/17  NA  --    < > 132* 133* 131* 133*  K  --    < > 4.3 4.8 5.0 4.0  CL  --    < > 104 102 101  --   CO2  --    < > 19* 19* 22  --   GLUCOSE  --    < > 86 110* 155*  --   BUN  --    < > 27* 29* 29* 16  CREATININE  --    < > 1.71* 1.67* 1.36* 1.4*  CALCIUM  --    < > 9.6 9.6 8.8*  --   MG 1.8  --   --   --   --   --    < > = values in this interval not displayed.   Recent Labs    08/29/17 1255 08/30/17 0403 09/09/17 2137  AST 24 22 19   ALT 15 13* 24  ALKPHOS 65 54 61  BILITOT 0.6 0.6 0.6  PROT 7.2 5.7* 8.0  ALBUMIN 3.4* 2.6* 3.9   Recent Labs    09/03/17 0155  09/09/17 2137 11/12/17 1800 11/28/17 0955 12/11/17  WBC 14.6*   < > 18.0* 12.1* 12.8* 13.3  NEUTROABS 13.1*  --  15.7*  --   --  10  HGB 8.7*   < > 10.3* 8.0* 8.0* 7.2*  HCT 29.0*   < > 34.0* 26.5* 27.5* 23*  MCV 82.9   < > 84.4 83.1 81.4  --   PLT 284   < > 478* 413* 518* 498*   < > = values in this interval not displayed.   No results for input(s): CHOL, LDLCALC, TRIG in the last 8760  hours.  Invalid input(s): HCL No results found for: MICROALBUR Lab Results  Component Value Date   TSH 0.298 (L) 09/09/2017   No results found for: HGBA1C No results found for: CHOL, HDL, LDLCALC, LDLDIRECT, TRIG, CHOLHDL  Significant Diagnostic Results in last 30 days:  Dg Tibia/fibula Right  Result Date: 11/12/2017 CLINICAL DATA:  Fall, pain.  Swelling and bruising above left eye. EXAM: RIGHT TIBIA AND FIBULA - 2 VIEW COMPARISON:  None. FINDINGS: There is no evidence of fracture or other focal bone lesions. Soft tissues are unremarkable. IMPRESSION: No fracture or dislocation. Electronically Signed   By: Franki Cabot M.D.   On: 11/12/2017 17:06   Ct Head Wo Contrast  Result Date: 11/12/2017 CLINICAL DATA:  Follow-up possible subdural hemorrhage EXAM: CT HEAD WITHOUT CONTRAST TECHNIQUE: Contiguous axial images were obtained from the base of the skull  through the vertex without intravenous contrast. COMPARISON:  11/12/2017 FINDINGS: Brain: No acute territorial infarction or intracranial mass is visualized. No significant change in size or appearance of the subtle, thin 1 mm hyperdensity along the left parietal temporal convexity. Atrophy and mild small vessel ischemic changes. Stable ventricle size. Vascular: No hyperdense vessels.  Carotid artery calcification. Skull: No depressed skull fracture Sinuses/Orbits: Left frontal sinus opacification. Mild mucosal thickening in the ethmoid and maxillary sinuses. Other: Left forehead and periorbital hematoma. Small right frontal parietal scalp swelling. IMPRESSION: No significant interval change in size or appearance of the subtle thin 1 mm hyperdensity along the left parietal temporal convexity, suspected to represent trace subdural blood. No new abnormalities are evident. Electronically Signed   By: Donavan Foil M.D.   On: 11/12/2017 21:01   Ct Head Wo Contrast  Result Date: 11/12/2017 CLINICAL DATA:  Hit head EXAM: CT HEAD WITHOUT CONTRAST CT CERVICAL SPINE WITHOUT CONTRAST TECHNIQUE: Multidetector CT imaging of the head and cervical spine was performed following the standard protocol without intravenous contrast. Multiplanar CT image reconstructions of the cervical spine were also generated. COMPARISON:  06/28/2015 FINDINGS: CT HEAD FINDINGS Brain: No acute territorial infarction, or intracranial mass is visualized. Suspect trace left temporal, parietal convexity sub dural hematoma, 1 mm thick, series 6, image number 37 through 41. No significant mass effect or midline shift. Moderate atrophy. Mild small vessel ischemic changes of the white matter. Stable ventricle size. Vascular: No hyperdense vessels.  Carotid artery calcification. Skull: No depressed skull fracture. Sinuses/Orbits: Opacified left frontal sinus. Mild mucosal thickening in the ethmoid and maxillary sinuses. No acute orbital abnormality.  Other: Large left forehead and supraorbital soft tissue hematoma. Smaller right frontal/parietal scalp hematoma. CT CERVICAL SPINE FINDINGS Alignment: Trace retrolisthesis of C4 on C5. Facet alignment is within normal limits. Skull base and vertebrae: No acute fracture. No primary bone lesion or focal pathologic process. Soft tissues and spinal canal: No prevertebral fluid or swelling. No visible canal hematoma. Disc levels: Moderate severe degenerative changes at C4-C5, C5-C6 and C6-C7. Multiple level bilateral facet arthropathy. Upper chest: Lung apices are clear. Carotid artery calcification. 2.1 cm low-density mass in the left lobe of the thyroid. Other: None IMPRESSION: 1. Findings suspicious for trace, 1 mm thick left convexity subdural hematoma. No midline shift or mass effect. 2. Large left forehead soft tissue hematoma and smaller right frontal/parietal scalp hematoma. 3. Atrophy and mild small vessel ischemic changes of the white matter 4. Trace retrolisthesis of C4 on C5, likely degenerative. No fracture seen. Critical Value/emergent results were called by telephone at the time of interpretation on 11/12/2017 at 5:07 pm to  Dr. Jola Schmidt , who verbally acknowledged these results. Electronically Signed   By: Donavan Foil M.D.   On: 11/12/2017 17:07   Ct Cervical Spine Wo Contrast  Result Date: 11/12/2017 CLINICAL DATA:  Hit head EXAM: CT HEAD WITHOUT CONTRAST CT CERVICAL SPINE WITHOUT CONTRAST TECHNIQUE: Multidetector CT imaging of the head and cervical spine was performed following the standard protocol without intravenous contrast. Multiplanar CT image reconstructions of the cervical spine were also generated. COMPARISON:  06/28/2015 FINDINGS: CT HEAD FINDINGS Brain: No acute territorial infarction, or intracranial mass is visualized. Suspect trace left temporal, parietal convexity sub dural hematoma, 1 mm thick, series 6, image number 37 through 41. No significant mass effect or midline shift.  Moderate atrophy. Mild small vessel ischemic changes of the white matter. Stable ventricle size. Vascular: No hyperdense vessels.  Carotid artery calcification. Skull: No depressed skull fracture. Sinuses/Orbits: Opacified left frontal sinus. Mild mucosal thickening in the ethmoid and maxillary sinuses. No acute orbital abnormality. Other: Large left forehead and supraorbital soft tissue hematoma. Smaller right frontal/parietal scalp hematoma. CT CERVICAL SPINE FINDINGS Alignment: Trace retrolisthesis of C4 on C5. Facet alignment is within normal limits. Skull base and vertebrae: No acute fracture. No primary bone lesion or focal pathologic process. Soft tissues and spinal canal: No prevertebral fluid or swelling. No visible canal hematoma. Disc levels: Moderate severe degenerative changes at C4-C5, C5-C6 and C6-C7. Multiple level bilateral facet arthropathy. Upper chest: Lung apices are clear. Carotid artery calcification. 2.1 cm low-density mass in the left lobe of the thyroid. Other: None IMPRESSION: 1. Findings suspicious for trace, 1 mm thick left convexity subdural hematoma. No midline shift or mass effect. 2. Large left forehead soft tissue hematoma and smaller right frontal/parietal scalp hematoma. 3. Atrophy and mild small vessel ischemic changes of the white matter 4. Trace retrolisthesis of C4 on C5, likely degenerative. No fracture seen. Critical Value/emergent results were called by telephone at the time of interpretation on 11/12/2017 at 5:07 pm to Dr. Jola Schmidt , who verbally acknowledged these results. Electronically Signed   By: Donavan Foil M.D.   On: 11/12/2017 17:07    Assessment and Plan  Medial and right knee/cellulitis bilateral lower extremities right much much greater than left/malodorous drainage-labs drawn yesterday showed WBC of 13.3 and hemoglobin 7.2; per nursing right leg looks worse today than it did yesterday; per patient right leg hurts much much more today than yesterday  and much more yesterday than the day before; have spoken to patient at length about changing physicians during an acute problem and patient understands but she will not let Dr. Sharol Given participates in her care going forward; there is no choice but to send patient to the ED; these admission for cellulitis and possible consideration for amputation    Time spent greater than 60 minutes;> 50% of time with patient was spent reviewing records, labs, tests and studies, counseling and developing plan of care  Hennie Duos MD

## 2017-12-12 NOTE — ED Notes (Signed)
RN notified of abnormal lab 

## 2017-12-12 NOTE — ED Notes (Addendum)
Called report to [facility], they will have to return my call.

## 2017-12-12 NOTE — ED Provider Notes (Addendum)
[facility] DEPT Provider Note   CSN: [id number] Arrival date & time: 12/12/17  1430     History   Chief Complaint Chief Complaint  Patient presents with  . Leg Pain    HPI Shelby Reese is a 83 y.o. female.  Pt presents to the ED today with right sided leg pain.  Pt has a hx of PVD with a hx of a large soft tissue wound to the right knee that Dr. Sharol Given has been trying to heal.  She had a recent repeat I&D with application of a wound VAC on 1/18.  She was admitted to SNF after d/c on 1/21.  She was sent to the ED on 1/23 due to increased drainage with odor.  Pt was d/c home with instructions to see Dr. Jess Barters NP yesterday.  The wound VAC was removed then and a right AKA was recommended for today.  Pt refused surgery by Dr. Sharol Given.  The pt told me that she did not wish for him to perform any other surgery on her.  She is aware that she does need an AKA.  The pt saw her doctor at the SNF who sent her to the ED because of increasing pain and increasing redness to the leg.          Past Medical History:  Diagnosis Date  . Allergy   . Anemia   . CKD (chronic kidney disease), stage II   . Fibromyalgia   . GERD (gastroesophageal reflux disease)   . Hiatal hernia   . Hypertension   . Hypothyroid   . Lymphedema    venous insufficency  . Osteoarthritis   . Peripheral vascular disease ([place])   . Ulcer of knee ([place])    right  . Urinary incontinence   . Varicose veins   . Venous insufficiency     Patient Active Problem List   Diagnosis Date Noted  . Wound of right leg, sequela 12/12/2017  . Open wound of right knee 11/28/2017  . Allergic drug rash   . Sepsis ([place]) 08/29/2017  . CKD (chronic kidney disease), stage III ([place]) 08/29/2017  . Hypothyroid 08/29/2017  . Hypertension 08/29/2017  . Peripheral vascular disease ([place]) 08/29/2017  . Skin ulcer of knee, right, with fat layer exposed ([place]) 08/29/2017  . Acute kidney injury ([place]) 08/29/2017  .  Metabolic acidosis 81/82/9937  . Acute hyponatremia 08/29/2017  . Pressure injury of skin 07/25/2017  . Wound, open, knee, lower leg, or ankle with complication, right, initial encounter   . Idiopathic chronic venous hypertension of both lower extremities with ulcer and inflammation ([place]) 07/15/2017  . Skin ulcer of right knee with necrosis of muscle ([place]) 07/15/2017  . Atherosclerosis of artery of right lower extremity ([place]) 07/11/2017  . Essential hypertension 07/11/2017  . Osteoporosis 07/11/2017  . Cataracts, bilateral 07/11/2017  . Hypothyroidism, acquired 12/27/2014  . Anemia, iron deficiency 12/27/2014  . Hiatal hernia 12/27/2014  . Varicose veins of lower extremities with other complications 16/96/7893    Past Surgical History:  Procedure Laterality Date  . APPLICATION OF WOUND VAC Right 07/18/2017   Procedure: APPLICATION OF WOUND VAC;  Surgeon: [name], MD;  Location: [facility];  Service: Orthopedics;  Laterality: Right;  . CATARACT EXTRACTION W/ INTRAOCULAR LENS  IMPLANT, BILATERAL    . COLONOSCOPY    . I&D EXTREMITY Right 07/18/2017   Procedure: IRRIGATION AND DEBRIDEMENT RIGHT KNEE;  Surgeon: [name], MD;  Location: [facility];  Service:  Orthopedics;  Laterality: Right;  . I&D EXTREMITY Right 07/23/2017   Procedure: REPEAT IRRIGATION AND DEBRIDEMENT RIGHT KNEE;  Surgeon: [name], MD;  Location: [facility];  Service: Orthopedics;  Laterality: Right;  . I&D EXTREMITY Right 11/28/2017   Procedure: IRRIGATION AND DEBRIDEMENT RIGHT KNEE , APPLY INSTILLATION VAC;  Surgeon: [name], MD;  Location: [facility];  Service: Orthopedics;  Laterality: Right;  . I&D EXTREMITY Right 12/03/2017   Procedure: REPEAT IRRIGATION AND DEBRIDEMENT RIGHT KNEE AND APPLICATION OF A WOUND VAC.;  Surgeon: [name], MD;  Location: [facility];  Service: Orthopedics;  Laterality: Right;  . KNEE ARTHROSCOPY Left    menisectomy  . MULTIPLE TOOTH EXTRACTIONS    . SKIN SPLIT GRAFT Right 07/25/2017    Procedure: Repeat Irrigation and Debridement Right Knee, Split Thickness Skin Graft;  Surgeon: [name], MD;  Location: [facility];  Service: Orthopedics;  Laterality: Right;  . SKIN SPLIT GRAFT Right 12/05/2017   Procedure: SKIN GRAFT SPLIT THICKNESS WOUND KNEE, APPLY VAC;  Surgeon: [name], MD;  Location: [facility];  Service: Orthopedics;  Laterality: Right;  . TONSILLECTOMY      OB History    No data available       Home Medications    Prior to Admission medications   Medication Sig Start Date End Date Taking? Authorizing Provider  acetaminophen (TYLENOL) 500 MG tablet Take 1,000 mg by mouth daily.   Yes [provider]  aspirin EC 81 MG tablet Take 81 mg by mouth daily with breakfast.   Yes [provider]  benazepril (LOTENSIN) 40 MG tablet Take 40 mg by mouth daily with breakfast.  03/26/13  Yes [provider]  Calcium Carbonate-Vitamin D (CALCIUM 600+D) 600-400 MG-UNIT per tablet Take 1 tablet by mouth daily at 3 pm.    Yes [provider]  cetirizine (ZYRTEC) 10 MG tablet Take 10 mg by mouth at bedtime.    Yes [provider]  diltiazem (TIAZAC) 120 MG 24 hr capsule Take 120 mg by mouth daily with breakfast.  01/29/13  Yes [provider]  diphenhydrAMINE (BENADRYL) 50 MG capsule Take 50 mg by mouth every 6 (six) hours as needed (for sneezing).    Yes [provider]  famotidine (PEPCID) 40 MG tablet Take 40 mg by mouth daily at 3 pm. 1600   Yes [provider]  ferrous sulfate 325 (65 FE) MG tablet Take 325 mg by mouth 2 (two) times daily with a meal.   Yes [provider]  hydrALAZINE (APRESOLINE) 25 MG tablet Take 1 tablet (25 mg total) by mouth every 8 (eight) hours. 09/04/17  Yes Debbe Odea, MD  ibuprofen (ADVIL,MOTRIN) 200 MG tablet Take 400 mg by mouth daily.   Yes [provider]  Magnesium Oxide (MAG-200 PO) Take 200 mg by mouth daily at 3 pm.   Yes [provider]    Multiple Vitamin (MULTIVITAMIN WITH MINERALS) TABS tablet Take 1 tablet by mouth daily.   Yes [provider]  NATURAL PSYLLIUM FIBER PO Take 1 capsule by mouth daily.   Yes [provider]  oxyCODONE-acetaminophen (PERCOCET/ROXICET) 5-325 MG tablet Take 1 tablet by mouth every 4 (four) hours. Scheduled, and 1 by mouth every 6 hours as needed for pain x 14 days starting 12/11/17   Yes [provider]  SYNTHROID 175 MCG tablet Take 175 mcg by mouth daily before breakfast.  09/20/15  Yes [provider]  vitamin C (ASCORBIC  ACID) 500 MG tablet Take 500 mg by mouth daily.   Yes [provider]  Gauze Pads & Dressings (KERLIX GAUZE ROLL LARGE) MISC 1 Units by Does not apply route 4 (four) times daily. 07/14/17   Suzan Slick, NP  Gauze Pads & Dressings 4"X4-1/2" PADS 2 Units by Does not apply route 4 (four) times daily. 07/14/17   Suzan Slick, NP    Family History Family History  Problem Relation Age of Onset  . Stroke Mother   . Varicose Veins Mother   . Cancer Father        prostate  . Stroke Sister   . Heart disease Sister   . Varicose Veins Sister   . Stroke Maternal Grandmother   . Varicose Veins Sister     Social History Social History   Tobacco Use  . Smoking status: Former Smoker    Packs/day: 0.50    Years: 10.00    Pack years: 5.00    Last attempt to quit: 11/19/1979    Years since quitting: 38.0  . Smokeless tobacco: Never Used  Substance Use Topics  . Alcohol use: Yes    Alcohol/week: 8.4 oz    Types: 14 Glasses of wine per week    Comment: 2 glasses of wine with dinner  . Drug use: No     Allergies   Cinnamon; Ciprofloxacin; Diovan [valsartan]; Food; Latex; Nitrofuran derivatives; Penicillins; Bactrim [sulfamethoxazole-trimethoprim]; Other; and Sulfa antibiotics   Review of Systems Review of Systems  Musculoskeletal:       Right leg pain  Skin:       Large wound right knee down through muscle with foul odor.   All other systems reviewed and are negative.    Physical Exam Updated Vital Signs BP (!) 131/41   Pulse 78   Temp 98.4 F (36.9 C)   Resp 18   Ht 5\' 3"  (1.6 m)   Wt 103 kg (227 lb)   SpO2 97%   BMI 40.21 kg/m   Physical Exam  Constitutional: She is oriented to person, place, and time. She appears well-developed and well-nourished.  HENT:  Head: Normocephalic and atraumatic.  Right Ear: External ear normal.  Left Ear: External ear normal.  Nose: Nose normal.  Mouth/Throat: Oropharynx is clear and moist.  Eyes: Conjunctivae and EOM are normal. Pupils are equal, round, and reactive to light.  Neck: Normal range of motion. Neck supple.  Cardiovascular: Normal rate, regular rhythm, normal heart sounds and intact distal pulses.  Pulmonary/Chest: Effort normal and breath sounds normal.  Abdominal: Soft. Bowel sounds are normal.  Musculoskeletal:  Large right skin deficit with surrounding cellulitis and odor  Neurological: She is alert and oriented to person, place, and time.  Skin: Skin is warm. Capillary refill takes less than 2 seconds.  Psychiatric: She has a normal mood and affect. Her behavior is normal. Judgment and thought content normal.  Nursing note and vitals reviewed.    ED Treatments / Results  Labs (all labs ordered are listed, but only abnormal results are displayed) Labs Reviewed  COMPREHENSIVE METABOLIC PANEL - Abnormal; Notable for the following components:      Result Value   Sodium 133 (*)    Glucose, Bld 117 (*)    Creatinine, Ser 1.38 (*)    Calcium 8.6 (*)    Total Protein 6.3 (*)    Albumin 2.5 (*)    ALT 13 (*)    Total Bilirubin 0.2 (*)    GFR  calc non Af Amer 35 (*)    GFR calc Af Amer 41 (*)    All other components within normal limits  CBC WITH DIFFERENTIAL/PLATELET - Abnormal; Notable for the following components:   WBC 14.5 (*)    RBC 2.75 (*)    Hemoglobin 6.7 (*)    HCT 21.9 (*)    MCH 24.4 (*)    RDW 16.9 (*)    Platelets 459  (*)    Neutro Abs 11.0 (*)    Monocytes Absolute 1.6 (*)    All other components within normal limits  CULTURE, BLOOD (ROUTINE X 2)  CULTURE, BLOOD (ROUTINE X 2)  URINALYSIS, ROUTINE W REFLEX MICROSCOPIC  I-STAT CG4 LACTIC ACID, ED  I-STAT CG4 LACTIC ACID, ED  TYPE AND SCREEN  ABO/RH  PREPARE RBC (CROSSMATCH)    EKG  EKG Interpretation  Date/Time:  Friday December 12 2017 15:23:35 EST Ventricular Rate:  74 PR Interval:    QRS Duration: 105 QT Interval:  401 QTC Calculation: 445 R Axis:   -24 Text Interpretation:  Sinus rhythm Borderline left axis deviation No significant change since last tracing Confirmed by Isla Pence [phone]) on 12/12/2017 4:02:51 PM       Radiology Dg Knee Complete 4 Views Right  Result Date: 12/12/2017 CLINICAL DATA:  Increased pain, swelling and redness of RIGHT leg EXAM: RIGHT KNEE - COMPLETE 4+ VIEW COMPARISON:  11/12/2017 FINDINGS: Numerous skin clips at medial aspect of RIGHT leg post recent wound management and split thickness skin grafting. Osseous mineralization normal. Medial compartment joint space narrowing. No acute fracture, dislocation, or bone destruction. No knee joint effusion. IMPRESSION: Degenerative changes RIGHT knee. No acute bony abnormalities. Electronically Signed   By: Lavonia Dana M.D.   On: 12/12/2017 17:15    Procedures Procedures (including critical care time)  Medications Ordered in ED Medications  ondansetron (ZOFRAN) injection 4 mg (4 mg Intravenous Refused 12/12/17 1557)  0.9 %  sodium chloride infusion (not administered)  sodium chloride 0.9 % bolus 1,000 mL (1,000 mLs Intravenous New Bag/Given 12/12/17 1539)  morphine 4 MG/ML injection 4 mg (4 mg Intravenous Given 12/12/17 1539)  vancomycin (VANCOCIN) IVPB 1000 mg/200 mL premix (1,000 mg Intravenous New Bag/Given 12/12/17 1557)  clindamycin (CLEOCIN) IVPB 600 mg (0 mg Intravenous Stopped 12/12/17 1625)  HYDROmorphone (DILAUDID) injection 1 mg (1 mg Intravenous Given  12/12/17 1629)     Initial Impression / Assessment and Plan / ED Course  I have reviewed the triage vital signs and the nursing notes.  Pertinent labs & imaging results that were available during my care of the patient were reviewed by me and considered in my medical decision making (see chart for details).  Due to low and worsening Hgb, 2 units blood were given.  Due to worsening infection and cellulitis, pt given vancomycin and clindamycin 9PCN allergic).  CRITICAL CARE Performed by: Isla Pence   Total critical care time: 30 minutes  Critical care time was exclusive of separately billable procedures and treating other patients.  Critical care was necessary to treat or prevent imminent or life-threatening deterioration.  Critical care was time spent personally by me on the following activities: development of treatment plan with patient and/or surrogate as well as nursing, discussions with consultants, evaluation of patient's response to treatment, examination of patient, obtaining history from patient or surrogate, ordering and performing treatments and interventions, ordering and review of laboratory studies, ordering and review of radiographic studies, pulse oximetry and re-evaluation of patient's condition.  As pt no longer wants Dr. Sharol Given to participate in her care, I spoke with Dr. Oneida Alar (vascular) who is willing to see her in consult, but she would have to go to [facility].  I spoke with Dr. Lonny Prude (triad) who will admit.  Final Clinical Impressions(s) / ED Diagnoses   Final diagnoses:  Cellulitis of right lower extremity  Anemia, unspecified type  Intractable pain  Nonhealing nonsurgical wound    ED Discharge Orders    None       Isla Pence, MD 12/12/17 1738    Isla Pence, MD 12/12/17 1757

## 2017-12-12 NOTE — Progress Notes (Signed)
Pharmacy Antibiotic Note  Shelby Reese is a 83 y.o. female with hx PVD and large soft tissue wound of the medial right knee (s/p wound VAC on 12/05/17).  She presented to the ED on 12/10/17 with c/o increased drainage and foul smell from wound and was subsequently discharged with instructions to f/u with Dr. Sharol Given on 12/11/17.  Dr. Sharol Given removed wound VAC on 12/11/17 and recommended right AKA on 12/12/17, but patient refused surgery. She presented back to the ED on 12/12/17 with c/o increased right leg swelling and pain.  To start vancomycin and cefepime for wound infection.  - scr 1.38 (crcl~37) - of note, pt has PCN listed in allergy section but received ceftriaxone, keflex and cefepime in the past  Plan: - will give an additional vancomycin 1000 mg IV to get 2 gm total, then 1250 mg IV q48h for est AUC 495 (goal 400-500) - cefepime 2gm IV q24h - monitor renal function closely  _______________________________  Height: 5\' 3"  (160 cm) Weight: 227 lb (103 kg) IBW/kg (Calculated) : 52.4  Temp (24hrs), Avg:98.8 F (37.1 C), Min:98.4 F (36.9 C), Max:99 F (37.2 C)  Recent Labs  Lab 12/11/17 12/12/17 1527 12/12/17 1531  WBC 13.3 14.5*  --   CREATININE 1.4* 1.38*  --   LATICACIDVEN  --   --  0.61    Estimated Creatinine Clearance: 37.3 mL/min (A) (by C-G formula based on SCr of 1.38 mg/dL (H)).    Allergies  Allergen Reactions  . Cinnamon Hives  . Ciprofloxacin Other (See Comments)    TREMORS  . Diovan [Valsartan] Other (See Comments)    Extreme vertigo  . Food Diarrhea and Other (See Comments)    ORANGE JUICE   UPSET STOMACH  . Latex Rash and Other (See Comments)    Rash/inflammation due to exposure  . Nitrofuran Derivatives Hives and Rash    "Full body rash"  . Penicillins Hives and Swelling    *tolerated Ceftriaxone September 2018 Has patient had a PCN reaction causing immediate rash, facial/tongue/throat swelling, SOB or lightheadedness with hypotension:No--severe irritation  at the injection site Has patient had a PCN reaction causing severe rash involving mucus membranes or skin necrosis:Unknown Has patient had a PCN reaction that required hospitalization:No Has patient had a PCN reaction occurring within the last 10 years:Yes If all of the above answers are "NO", then may proceed with  . Bactrim [Sulfamethoxazole-Trimethoprim] Diarrhea and Nausea Only  . Other Rash    Mycins  . Sulfa Antibiotics Diarrhea and Nausea Only    Thank you for allowing pharmacy to be a part of this patient's care.  Lynelle Doctor 12/12/2017 6:54 PM

## 2017-12-12 NOTE — ED Notes (Signed)
[facility] arrived  

## 2017-12-12 NOTE — ED Notes (Signed)
Report given to [facility]. 

## 2017-12-12 NOTE — ED Notes (Signed)
Both sets of blood cultures drawn and sent to lab.

## 2017-12-12 NOTE — ED Triage Notes (Signed)
EMS reports from [facility] increased pain swelling and redness on right leg. DC from [facility] Monday for injury/chronic wound to right knee. Seen at [facility] ED Wednesday and released. Seeing Dr Sharol Given but wants new MD. Hx of PAD  BP 110/60 HR 75 Resp 16 Sp02 95 RA

## 2017-12-12 NOTE — ED Notes (Signed)
Pt is requesting a blood transfusion. I told her that I would make the MD aware.

## 2017-12-12 NOTE — ED Notes (Signed)
[facility] called. 

## 2017-12-13 ENCOUNTER — Other Ambulatory Visit: Payer: Self-pay

## 2017-12-13 ENCOUNTER — Encounter: Payer: Self-pay | Admitting: Internal Medicine

## 2017-12-13 DIAGNOSIS — D509 Iron deficiency anemia, unspecified: Secondary | ICD-10-CM

## 2017-12-13 DIAGNOSIS — D62 Acute posthemorrhagic anemia: Secondary | ICD-10-CM | POA: Insufficient documentation

## 2017-12-13 LAB — BASIC METABOLIC PANEL
Anion gap: 12 (ref 5–15)
BUN: 16 mg/dL (ref 6–20)
CO2: 19 mmol/L — ABNORMAL LOW (ref 22–32)
Calcium: 8.4 mg/dL — ABNORMAL LOW (ref 8.9–10.3)
Chloride: 101 mmol/L (ref 101–111)
Creatinine, Ser: 1.35 mg/dL — ABNORMAL HIGH (ref 0.44–1.00)
GFR calc Af Amer: 42 mL/min — ABNORMAL LOW (ref >60)
GFR, EST NON AFRICAN AMERICAN: 36 mL/min — AB (ref >60)
GLUCOSE: 91 mg/dL (ref 65–99)
POTASSIUM: 3.5 mmol/L (ref 3.5–5.1)
Sodium: 132 mmol/L — ABNORMAL LOW (ref 135–145)

## 2017-12-13 LAB — GLUCOSE, CAPILLARY
Glucose-Capillary: 103 mg/dL — ABNORMAL HIGH (ref 65–99)
Glucose-Capillary: 94 mg/dL (ref 65–99)

## 2017-12-13 LAB — HEMOGLOBIN AND HEMATOCRIT, BLOOD
HCT: 29.6 % — ABNORMAL LOW (ref 36.0–46.0)
Hemoglobin: 9.2 g/dL — ABNORMAL LOW (ref 12.0–15.0)

## 2017-12-13 LAB — PREALBUMIN: PREALBUMIN: 6.8 mg/dL — AB (ref 18–38)

## 2017-12-13 LAB — HIV ANTIBODY (ROUTINE TESTING W REFLEX): HIV Screen 4th Generation wRfx: NONREACTIVE

## 2017-12-13 LAB — HEMOGLOBIN A1C
Hgb A1c MFr Bld: 5.6 % (ref 4.8–5.6)
Mean Plasma Glucose: 114.02 mg/dL

## 2017-12-13 LAB — C-REACTIVE PROTEIN: CRP: 22.9 mg/dL — AB (ref <1.0)

## 2017-12-13 LAB — SEDIMENTATION RATE: SED RATE: 104 mm/h — AB (ref 0–22)

## 2017-12-13 MED ORDER — DEXTROSE 5 % IV SOLN: 2.0000 g | INTRAVENOUS | Status: DC

## 2017-12-13 MED ORDER — ADULT MULTIVITAMIN W/MINERALS CH
1.0000 | ORAL_TABLET | Freq: Every day | ORAL | Status: DC
  Administered 2017-12-13 – 2017-12-23 (×10): 1 via ORAL
  Filled 2017-12-13 (×10): qty 1

## 2017-12-13 MED ORDER — HYDROMORPHONE HCL 1 MG/ML IJ SOLN
0.5000 mg | INTRAMUSCULAR | Status: DC | PRN
  Administered 2017-12-13 – 2017-12-14 (×5): 1 mg via INTRAVENOUS
  Filled 2017-12-13 (×5): qty 1

## 2017-12-13 MED ORDER — JUVEN PO PACK
1.0000 | PACK | Freq: Two times a day (BID) | ORAL | Status: DC
  Filled 2017-12-13 (×21): qty 1

## 2017-12-13 MED ORDER — TRAZODONE HCL 50 MG PO TABS
50.0000 mg | ORAL_TABLET | Freq: Once | ORAL | Status: AC
  Administered 2017-12-13: 50 mg via ORAL
  Filled 2017-12-13: qty 1

## 2017-12-13 MED ORDER — MORPHINE SULFATE (PF) 2 MG/ML IV SOLN
2.0000 mg | Freq: Once | INTRAVENOUS | Status: AC
  Administered 2017-12-13: 2 mg via INTRAVENOUS
  Filled 2017-12-13: qty 1

## 2017-12-13 MED ORDER — DEXTROSE 5 % IV SOLN
2.0000 g | INTRAVENOUS | Status: DC
  Administered 2017-12-13 – 2017-12-23 (×11): 2 g via INTRAVENOUS
  Filled 2017-12-13 (×11): qty 2

## 2017-12-13 NOTE — Consult Note (Signed)
Referring Physician: Dr Sharen [facility]  Patient name: Shelby Reese MRN: [id number] DOB: 10/18/1935 Sex: female  REASON FOR CONSULT: 2nd opinion right leg wound  HPI: Shelby Reese is a 83 y.o. female with chronic wounds on right leg.  She has undergone multiple debridements and skin grafts on right leg by Dr Sharol Given over the last 4 months.  She was scheduled to have right AKA by Dr Sharol Given yesterday but decided she wanted another opinion and cancelled operation.  Pt has continuous pain from wounds in right leg. Other medical problems include CKD 2, hypertension, obesity all of which have been stable.  She denies fever and chills.  She currently resides in a SNF.  She does not appear to be ambulatory.  She is currently on Vanc Cefipime Flagyl antibiotic coverage.  Past Medical History:  Diagnosis Date  . Allergy   . Anemia   . CKD (chronic kidney disease), stage II   . Fibromyalgia   . GERD (gastroesophageal reflux disease)   . Hiatal hernia   . Hypertension   . Hypothyroid   . Lymphedema    venous insufficency  . Osteoarthritis   . Peripheral vascular disease ([place])   . Ulcer of knee ([place])    right  . Urinary incontinence   . Varicose veins   . Venous insufficiency    Past Surgical History:  Procedure Laterality Date  . APPLICATION OF WOUND VAC Right 07/18/2017   Procedure: APPLICATION OF WOUND VAC;  Surgeon: [name], MD;  Location: [place];  Service: Orthopedics;  Laterality: Right;  . CATARACT EXTRACTION W/ INTRAOCULAR LENS  IMPLANT, BILATERAL    . COLONOSCOPY    . I&D EXTREMITY Right 07/18/2017   Procedure: IRRIGATION AND DEBRIDEMENT RIGHT KNEE;  Surgeon: [name], MD;  Location: [facility];  Service: Orthopedics;  Laterality: Right;  . I&D EXTREMITY Right 07/23/2017   Procedure: REPEAT IRRIGATION AND DEBRIDEMENT RIGHT KNEE;  Surgeon: [name], MD;  Location: [place];  Service: Orthopedics;  Laterality: Right;  . I&D EXTREMITY Right 11/28/2017   Procedure: IRRIGATION  AND DEBRIDEMENT RIGHT KNEE , APPLY INSTILLATION VAC;  Surgeon: [name], MD;  Location: [place];  Service: Orthopedics;  Laterality: Right;  . I&D EXTREMITY Right 12/03/2017   Procedure: REPEAT IRRIGATION AND DEBRIDEMENT RIGHT KNEE AND APPLICATION OF A WOUND VAC.;  Surgeon: [name], MD;  Location: [place];  Service: Orthopedics;  Laterality: Right;  . KNEE ARTHROSCOPY Left    menisectomy  . MULTIPLE TOOTH EXTRACTIONS    . SKIN SPLIT GRAFT Right 07/25/2017   Procedure: Repeat Irrigation and Debridement Right Knee, Split Thickness Skin Graft;  Surgeon: [name], MD;  Location: [place];  Service: Orthopedics;  Laterality: Right;  . SKIN SPLIT GRAFT Right 12/05/2017   Procedure: SKIN GRAFT SPLIT THICKNESS WOUND KNEE, APPLY VAC;  Surgeon: [name], MD;  Location: [place];  Service: Orthopedics;  Laterality: Right;  . TONSILLECTOMY      Family History  Problem Relation Age of Onset  . Stroke Mother   . Varicose Veins Mother   . Cancer Father        prostate  . Stroke Sister   . Heart disease Sister   . Varicose Veins Sister   . Stroke Maternal Grandmother   . Varicose Veins Sister     SOCIAL HISTORY: Social History   Socioeconomic History  . Marital status: Married    Spouse name: Not on  file  . Number of children: 0  . Years of education: Not on file  . Highest education level: Not on file  Social Needs  . Financial resource strain: Not on file  . Food insecurity - worry: Not on file  . Food insecurity - inability: Not on file  . Transportation needs - medical: Not on file  . Transportation needs - non-medical: Not on file  Occupational History    Comment: retired Marine scientist.   Tobacco Use  . Smoking status: Former Smoker    Packs/day: 0.50    Years: 10.00    Pack years: 5.00    Last attempt to quit: 11/19/1979    Years since quitting: 38.0  . Smokeless tobacco: Never Used  Substance and Sexual Activity  . Alcohol use: Yes    Alcohol/week: 8.4 oz    Types: 14  Glasses of wine per week    Comment: 2 glasses of wine with dinner  . Drug use: No  . Sexual activity: Not on file  Other Topics Concern  . Not on file  Social History Narrative  . Not on file    Allergies  Allergen Reactions  . Cinnamon Hives  . Ciprofloxacin Other (See Comments)    TREMORS  . Diovan [Valsartan] Other (See Comments)    Extreme vertigo  . Food Diarrhea and Other (See Comments)    ORANGE JUICE   UPSET STOMACH  . Latex Rash and Other (See Comments)    Rash/inflammation due to exposure  . Nitrofuran Derivatives Hives and Rash    "Full body rash"  . Penicillins Hives and Swelling    *tolerated Ceftriaxone September 2018 Has patient had a PCN reaction causing immediate rash, facial/tongue/throat swelling, SOB or lightheadedness with hypotension:No--severe irritation at the injection site Has patient had a PCN reaction causing severe rash involving mucus membranes or skin necrosis:Unknown Has patient had a PCN reaction that required hospitalization:No Has patient had a PCN reaction occurring within the last 10 years:Yes If all of the above answers are "NO", then may proceed with  . Bactrim [Sulfamethoxazole-Trimethoprim] Diarrhea and Nausea Only  . Other Rash    Mycins  . Sulfa Antibiotics Diarrhea and Nausea Only    Current Facility-Administered Medications  Medication Dose Route Frequency Provider Last Rate Last Dose  . 0.9 %  sodium chloride infusion   Intravenous Continuous Caren Griffins, MD 50 mL/hr at 12/13/17 [phone]    . ceFEPIme (MAXIPIME) 2 g in dextrose 5 % 50 mL IVPB  2 g Intravenous Q24H Desiree Hane, MD   Stopped at 12/13/17 0144  . famotidine (PEPCID) tablet 40 mg  40 mg Oral Q1500 Oretha Milch D, MD      . HYDROmorphone (DILAUDID) injection 0.5-1 mg  0.5-1 mg Intravenous Q4H PRN Caren Griffins, MD   1 mg at 12/13/17 0859  . levothyroxine (SYNTHROID, LEVOTHROID) tablet 175 mcg  175 mcg Oral QAC breakfast Oretha Milch D, MD   175 mcg at  12/13/17 0843  . metroNIDAZOLE (FLAGYL) IVPB 500 mg  500 mg Intravenous Q8H Desiree Hane, MD   Stopped at 12/13/17 [phone]  . ondansetron (ZOFRAN) injection 4 mg  4 mg Intravenous Once Isla Pence, MD      . ondansetron [facility]) tablet 4 mg  4 mg Oral Q6H PRN Oretha Milch D, MD       Or  . ondansetron (ZOFRAN) injection 4 mg  4 mg Intravenous Q6H PRN Desiree Hane, MD      .  oxyCODONE-acetaminophen (PERCOCET/ROXICET) 5-325 MG per tablet 1 tablet  1 tablet Oral Q4H PRN Oretha Milch D, MD   1 tablet at 12/13/17 1106  . senna-docusate (Senokot-S) tablet 1 tablet  1 tablet Oral QHS PRN Desiree Hane, MD      . Derrill Memo ON 12/14/2017] vancomycin (VANCOCIN) 1,250 mg in sodium chloride 0.9 % 250 mL IVPB  1,250 mg Intravenous Q48H Pham, Anh P, RPH      . vancomycin (VANCOCIN) IVPB 1000 mg/200 mL premix  1,000 mg Intravenous NOW Pham, Anh P, RPH        ROS:   General:  No weight loss, Fever, chills  HEENT: No recent headaches, no nasal bleeding, no visual changes, no sore throat  Neurologic: No dizziness, blackouts, seizures. No recent symptoms of stroke or mini- stroke. No recent episodes of slurred speech, or temporary blindness.  Cardiac: No recent episodes of chest pain/pressure, no shortness of breath at rest.  + shortness of breath with exertion.  Denies history of atrial fibrillation or irregular heartbeat  Vascular: No history of rest pain in feet.  No history of claudication.  + history of non-healing ulcer, No history of DVT   Pulmonary: No home oxygen, no productive cough, no hemoptysis,  No asthma or wheezing  Musculoskeletal:  [X]  Arthritis, [X]  Low back pain,  [X]  Joint pain  Hematologic:No history of hypercoagulable state.  No history of easy bleeding.  No history of anemia  Gastrointestinal: No hematochezia or melena,  No gastroesophageal reflux, no trouble swallowing  Urinary: [X]  chronic Kidney disease, [ ]  on HD - [ ]  MWF or [ ]  TTHS, [ ]  Burning with urination,  [ ]  Frequent urination, [ ]  Difficulty urinating;   Skin: No rashes  Psychological: No history of anxiety,  No history of depression   Physical Examination  Vitals:   12/13/17 0231 12/13/17 0511 12/13/17 0529 12/13/17 0743  BP: (!) 119/43 99/80 (!) 153/56 (!) 168/53  Pulse: 65 70 69 82  Resp: 17 18 18    Temp: 98.2 F (36.8 C) 98.1 F (36.7 C) 98.5 F (36.9 C) 99.4 F (37.4 C)  TempSrc: Oral Oral Oral Oral  SpO2: 98% 97% 97% 97%  Weight:      Height:        Body mass index is 40.21 kg/m.  General:  Alert and oriented, no acute distress HEENT: Normal Neck: No JVD Pulmonary: Clear to auscultation bilaterally Cardiac: Regular Rate and Rhythm  Abdomen: Soft, non-tender, non-distended, obese Skin: No rash, full thickness wound adjacent to right knee extending above the knee with surrounding erythema.  Still with staples from prior skin graft which has sloughed, foot pink warm, erythema and tenderness up into the high thigh Tibial ulcer left leg clean Extremity Pulses:  2+ radial, brachial,2+ left femoral, absent right femoral, dorsalis pedis, posterior tibial pulses  Musculoskeletal: No deformity diffuse lower extremity bilateral edema  Neurologic: Upper and lower extremity motor 5/5 and symmetric  DATA:  CBC    Component Value Date/Time   WBC 14.5 (H) 12/12/2017 1527   RBC 2.75 (L) 12/12/2017 1527   HGB 9.2 (L) 12/13/2017 0845   HGB 11.9 04/15/2013 1516   HCT 29.6 (L) 12/13/2017 0845   HCT 38.7 04/15/2013 1516   PLT 459 (H) 12/12/2017 1527   PLT 289 04/15/2013 1516   MCV 79.6 12/12/2017 1527   MCV 88.0 12/25/2014 1540   MCV 89.6 04/15/2013 1516   MCH 24.4 (L) 12/12/2017 1527   MCHC 30.6 12/12/2017 1527  RDW 16.9 (H) 12/12/2017 1527   RDW 15.1 (H) 04/15/2013 1516   LYMPHSABS 1.5 12/12/2017 1527   LYMPHSABS 2.4 04/15/2013 1516   MONOABS 1.6 (H) 12/12/2017 1527   MONOABS 0.6 04/15/2013 1516   EOSABS 0.4 12/12/2017 1527   EOSABS 0.5 04/15/2013 1516   BASOSABS  0.0 12/12/2017 1527   BASOSABS 0.0 04/15/2013 1516    BMET    Component Value Date/Time   NA 132 (L) 12/13/2017 0844   NA 133 (A) 12/11/2017   NA 140 04/15/2013 1516   K 3.5 12/13/2017 0844   K 4.3 04/15/2013 1516   CL 101 12/13/2017 0844   CL 105 04/15/2013 1516   CO2 19 (L) 12/13/2017 0844   CO2 25 04/15/2013 1516   GLUCOSE 91 12/13/2017 0844   GLUCOSE 94 04/15/2013 1516   BUN 16 12/13/2017 0844   BUN 16 12/11/2017   BUN 28.2 (H) 04/15/2013 1516   CREATININE 1.35 (H) 12/13/2017 0844   CREATININE 1.21 (H) 12/25/2014 1530   CREATININE 1.4 (H) 04/15/2013 1516   CALCIUM 8.4 (L) 12/13/2017 0844   CALCIUM 9.9 04/15/2013 1516   GFRNONAA 36 (L) 12/13/2017 0844   GFRNONAA 43 (L) 12/25/2014 1530   GFRAA 42 (L) 12/13/2017 0844   GFRAA 50 (L) 12/25/2014 1530   Prealbumin 6.8  ASSESSMENT:  Non healing wounds with erythema cellulitis right leg.  Pt requesting AKA and I believe leg is non salvageable.  She has ongoing cellulitis and is on antibiotics   PLAN:  Right AKA on Monday 1/28.  Pt may need open aka if tissues appear marginal or infected and required delayed primary or VAC closure  She has severe protein calorie malnutrition which is going to impair wound healing  Will get nutrition to see and supplement   Ruta Hinds, MD [facility] Office: [phone] Pager: [phone]

## 2017-12-13 NOTE — Progress Notes (Signed)
PROGRESS NOTE  Shelby Reese IDP:[id number] DOB: [birth year] DOA: 12/12/2017 PCP: Shelby Pretty, MD   LOS: 1 day   Brief Narrative / Interim history: 83 y.o. female with medical history significant for PVD and persistent ulceration of right knee (medially) who underwent limb salvage intervention with debridement, skin graft therapy and wound VAC therapy most recently on 12/05/2017  who presents on 12/12/2017 after evaluation from physician at her SNF with worsening right knee pain and malodorous drainage was found to be septic secondary to the right knee wound.  Patient was previously evaluated in the ED on 12/09/17 for increased drainage with foul odor.  ED physicians did not visualize wound due to postoperative dressings, lab work and vitals at the time were stable and unremarkable so plans were made for patient to follow-up with her orthopedic surgeon on 1/24.  Dr. Sharol Reese evaluated Shelby Reese on 1/24.  At that time patient stated she cannot tolerate continued compression therapy and current wound care.  Initial plan was for patient to undergo an AKA however patient then requested to cancel surgery as she was seeking a second opinion on wound care, and Dr. Oneida Reese was consulted and agreed to see patient in consultation  Assessment & Plan: Principal Problem:   Sepsis ([place]) Active Problems:   Iron deficiency anemia   CKD (chronic kidney disease), stage III ([facility])   Wound of right leg, sequela   Sepsis secondary to right knee wound infection -Patient status post multiple irrigation and debridements, and previous wound VAC (removed recently).  Low-grade fever, leukocytosis, and likely source meet criteria for sepsis. -Continue wound care -Continue vancomycin, Flagyl and cefepime, sepsis physiology improved -Discussed with Dr. Oneida Reese from vascular surgery, he would see patient in consultation.  Patient is agreeable to amputation as she understands this is a life saving measure.  She does not want any  surgery today, will let her eat -Continue pain control  Acute on chronic anemia, iron deficiency -Hemoglobin on admission 6.7.  Patient receiving blood transition currently. No signs or symptoms of current bleeding -Sepsis also contributing as well as some degree of chronic blood loss from her wound -Repeat H&H pending  Hypertension, relative hypotension, likely related to sepsis -Lactic acid within normal limits.  Status post 1 L bolus normal saline in the ED -Blood pressure has improved to the 150s-160s, decrease IV fluids rate  CKD stage III, stable -Baseline creatinine 1.4-1.6.  Current current creatinine 1.38 consistent with baseline. -Avoid nephrotoxins, continue to monitor   DVT prophylaxis: SCDs Code Status: Full code Family Communication: no family at bedside Disposition Plan: TBD  Consultants:   Vascular surgery, Dr. Oneida Reese  Procedures:   None   Antimicrobials:  Cefepime 1/25 >>  Vancomycin 1/25 >>   Subjective: -Patient without any specific complaints this morning, she generally is very hungry and requests to eat.  She says she will not have any surgery today but he is agreeable to have surgery, just not today.  Denies any chest pain, denies any shortness of breath.  Objective: Vitals:   12/13/17 0231 12/13/17 0511 12/13/17 0529 12/13/17 0743  BP: (!) 119/43 99/80 (!) 153/56 (!) 168/53  Pulse: 65 70 69 82  Resp: 17 18 18    Temp: 98.2 F (36.8 C) 98.1 F (36.7 C) 98.5 F (36.9 C) 99.4 F (37.4 C)  TempSrc: Oral Oral Oral Oral  SpO2: 98% 97% 97% 97%  Weight:      Height:        Intake/Output Summary (Last 24  hours) at 12/13/2017 0816 Last data filed at 12/13/2017 0500 Gross per 24 hour  Intake 2282.33 ml  Output -  Net 2282.33 ml   Filed Weights   12/12/17 1442  Weight: 103 kg (227 lb)    Examination:  Constitutional: NAD Eyes: lids and conjunctivae normal ENMT: Mucous membranes are moist. Neck: normal, supple, Respiratory: clear to  auscultation bilaterally, no wheezing, no crackles. Normal respiratory effort.  Cardiovascular: Regular rate and rhythm, 3/6 SEM. No LE edema. 2+ pedal pulses. No carotid bruits.  Abdomen: no tenderness. Bowel sounds positive.  Musculoskeletal: no clubbing / cyanosis. Large necrotic wound medial right knee Skin: no rashes Neurologic: CN 2-12 grossly intact. Strength 5/5 in all 4.  Psychiatric: Normal judgment and insight. Alert and oriented x 3. Normal mood.    Data Reviewed: I have independently reviewed following labs and imaging studies   CBC: Recent Labs  Lab 12/11/17 12/12/17 1527  WBC 13.3 14.5*  NEUTROABS 10 11.0*  HGB 7.2* 6.7*  HCT 23* 21.9*  MCV  --  79.6  PLT 498* 382*   Basic Metabolic Panel: Recent Labs  Lab 12/11/17 12/12/17 1527  NA 133* 133*  K 4.0 3.5  CL  --  101  CO2  --  24  GLUCOSE  --  117*  BUN 16 18  CREATININE 1.4* 1.38*  CALCIUM  --  8.6*   GFR: Estimated Creatinine Clearance: 37.3 mL/min (A) (by C-G formula based on SCr of 1.38 mg/dL (H)). Liver Function Tests: Recent Labs  Lab 12/12/17 1527  AST 18  ALT 13*  ALKPHOS 69  BILITOT 0.2*  PROT 6.3*  ALBUMIN 2.5*   No results for input(s): LIPASE, AMYLASE in the last 168 hours. No results for input(s): AMMONIA in the last 168 hours. Coagulation Profile: No results for input(s): INR, PROTIME in the last 168 hours. Cardiac Enzymes: No results for input(s): CKTOTAL, CKMB, CKMBINDEX, TROPONINI in the last 168 hours. BNP (last 3 results) No results for input(s): PROBNP in the last 8760 hours. HbA1C: Recent Labs    12/12/17 2350  HGBA1C 5.6   CBG: Recent Labs  Lab 12/08/17 1141  GLUCAP 88   Lipid Profile: No results for input(s): CHOL, HDL, LDLCALC, TRIG, CHOLHDL, LDLDIRECT in the last 72 hours. Thyroid Function Tests: No results for input(s): TSH, T4TOTAL, FREET4, T3FREE, THYROIDAB in the last 72 hours. Anemia Panel: No results for input(s): VITAMINB12, FOLATE, FERRITIN, TIBC,  IRON, RETICCTPCT in the last 72 hours. Urine analysis:    Component Value Date/Time   COLORURINE AMBER (A) 12/12/2017 1719   APPEARANCEUR CLEAR 12/12/2017 1719   LABSPEC 1.020 12/12/2017 1719   PHURINE 5.0 12/12/2017 1719   GLUCOSEU NEGATIVE 12/12/2017 1719   HGBUR NEGATIVE 12/12/2017 1719   BILIRUBINUR NEGATIVE 12/12/2017 1719   KETONESUR NEGATIVE 12/12/2017 1719   PROTEINUR NEGATIVE 12/12/2017 1719   NITRITE POSITIVE (A) 12/12/2017 1719   LEUKOCYTESUR MODERATE (A) 12/12/2017 1719   Sepsis Labs: Invalid input(s): PROCALCITONIN, LACTICIDVEN  No results found for this or any previous visit (from the past 240 hour(s)).    Radiology Studies: Dg Knee Complete 4 Views Right  Result Date: 12/12/2017 CLINICAL DATA:  Increased pain, swelling and redness of RIGHT leg EXAM: RIGHT KNEE - COMPLETE 4+ VIEW COMPARISON:  11/12/2017 FINDINGS: Numerous skin clips at medial aspect of RIGHT leg post recent wound management and split thickness skin grafting. Osseous mineralization normal. Medial compartment joint space narrowing. No acute fracture, dislocation, or bone destruction. No knee joint effusion. IMPRESSION: Degenerative  changes RIGHT knee. No acute bony abnormalities. Electronically Signed   By: Lavonia Dana M.D.   On: 12/12/2017 17:15     Scheduled Meds: . famotidine  40 mg Oral Q1500  . levothyroxine  175 mcg Oral QAC breakfast  . ondansetron (ZOFRAN) IV  4 mg Intravenous Once   Continuous Infusions: . sodium chloride 100 mL/hr at 12/13/17 0004  . ceFEPime (MAXIPIME) IV Stopped (12/13/17 0144)  . metronidazole Stopped (12/13/17 0104)  . [START ON 12/14/2017] vancomycin    . vancomycin      Marzetta Board, MD, PhD [facility] Pager [phone]  If 7PM-7AM, please contact night-coverage [email or web address] Password [email or web address] 12/13/2017, 8:16 AM

## 2017-12-13 NOTE — Care Management Note (Signed)
Case Management Note  Patient Details  Name: Shelby Reese MRN: [id number] Date of Birth: Jul 13, 1935  Subjective/Objective:      Pt admitted from SNF.  Pt states unsure of which one but she liked the one where she was last..  Per chart, appears it was [facility].  Pt states she has accepted that her leg needs to be amputated but per her understanding she will be here for "maybe for months" with successive surgeries.  Pt states she does not want to return to SNF because her husband has dementia and needs supervision at home.  She and her husband own a house and he is living there alone currently.  She thinks ultimately they should go to same ALF but they cannot afford to privately pay for ALF until they sell home.  Pt states she was not able to contact husband for the last 2 days and he didn't know where she was.  She reached him on home phone this am but states she didn't have a phone at the SNF and he has not answered home phone.  He has a cell phone but she doesn't know number.   They have no family in the area and no one to check on husband.           Action/Plan: CSW consult to assess situation with husband and SNF placement after pt's surgery.  Expected Discharge Date:  (unknown)               Expected Discharge Plan:  Skilled Nursing Facility  In-House Referral:  Clinical Social Work  Discharge planning Services  CM Consult  Post Acute Care Choice:    Choice offered to:     DME Arranged:    DME Agency:     HH Arranged:    [place] Agency:     Status of Service:  In process, will continue to follow  If discussed at Long Length of Stay Meetings, dates discussed:    Additional Comments:  Arley Phenix, RN 12/13/2017, 11:48 AM

## 2017-12-13 NOTE — Progress Notes (Signed)
Patient arrived with PTAR  from [facility]. Pt is alert.  No sign of distress.

## 2017-12-13 NOTE — H&P (View-Only) (Signed)
Referring Physician: Dr Sharen [facility]  Patient name: Shelby Reese MRN: [id number] DOB: August 23, 1935 Sex: female  REASON FOR CONSULT: 2nd opinion right leg wound  HPI: Shelby Reese is a 83 y.o. female with chronic wounds on right leg.  She has undergone multiple debridements and skin grafts on right leg by Dr Sharol Given over the last 4 months.  She was scheduled to have right AKA by Dr Sharol Given yesterday but decided she wanted another opinion and cancelled operation.  Pt has continuous pain from wounds in right leg. Other medical problems include CKD 2, hypertension, obesity all of which have been stable.  She denies fever and chills.  She currently resides in a SNF.  She does not appear to be ambulatory.  She is currently on Vanc Cefipime Flagyl antibiotic coverage.  Past Medical History:  Diagnosis Date  . Allergy   . Anemia   . CKD (chronic kidney disease), stage II   . Fibromyalgia   . GERD (gastroesophageal reflux disease)   . Hiatal hernia   . Hypertension   . Hypothyroid   . Lymphedema    venous insufficency  . Osteoarthritis   . Peripheral vascular disease ([place])   . Ulcer of knee ([place])    right  . Urinary incontinence   . Varicose veins   . Venous insufficiency    Past Surgical History:  Procedure Laterality Date  . APPLICATION OF WOUND VAC Right 07/18/2017   Procedure: APPLICATION OF WOUND VAC;  Surgeon: [name], MD;  Location: [facility];  Service: Orthopedics;  Laterality: Right;  . CATARACT EXTRACTION W/ INTRAOCULAR LENS  IMPLANT, BILATERAL    . COLONOSCOPY    . I&D EXTREMITY Right 07/18/2017   Procedure: IRRIGATION AND DEBRIDEMENT RIGHT KNEE;  Surgeon: [name], MD;  Location: [facility];  Service: Orthopedics;  Laterality: Right;  . I&D EXTREMITY Right 07/23/2017   Procedure: REPEAT IRRIGATION AND DEBRIDEMENT RIGHT KNEE;  Surgeon: [name], MD;  Location: [facility];  Service: Orthopedics;  Laterality: Right;  . I&D EXTREMITY Right 11/28/2017   Procedure: IRRIGATION  AND DEBRIDEMENT RIGHT KNEE , APPLY INSTILLATION VAC;  Surgeon: [name], MD;  Location: [facility];  Service: Orthopedics;  Laterality: Right;  . I&D EXTREMITY Right 12/03/2017   Procedure: REPEAT IRRIGATION AND DEBRIDEMENT RIGHT KNEE AND APPLICATION OF A WOUND VAC.;  Surgeon: [name], MD;  Location: [facility];  Service: Orthopedics;  Laterality: Right;  . KNEE ARTHROSCOPY Left    menisectomy  . MULTIPLE TOOTH EXTRACTIONS    . SKIN SPLIT GRAFT Right 07/25/2017   Procedure: Repeat Irrigation and Debridement Right Knee, Split Thickness Skin Graft;  Surgeon: [name], MD;  Location: [facility];  Service: Orthopedics;  Laterality: Right;  . SKIN SPLIT GRAFT Right 12/05/2017   Procedure: SKIN GRAFT SPLIT THICKNESS WOUND KNEE, APPLY VAC;  Surgeon: [name], MD;  Location: [facility];  Service: Orthopedics;  Laterality: Right;  . TONSILLECTOMY      Family History  Problem Relation Age of Onset  . Stroke Mother   . Varicose Veins Mother   . Cancer Father        prostate  . Stroke Sister   . Heart disease Sister   . Varicose Veins Sister   . Stroke Maternal Grandmother   . Varicose Veins Sister     SOCIAL HISTORY: Social History   Socioeconomic History  . Marital status: Married    Spouse name: Not on  file  . Number of children: 0  . Years of education: Not on file  . Highest education level: Not on file  Social Needs  . Financial resource strain: Not on file  . Food insecurity - worry: Not on file  . Food insecurity - inability: Not on file  . Transportation needs - medical: Not on file  . Transportation needs - non-medical: Not on file  Occupational History    Comment: retired Marine scientist.   Tobacco Use  . Smoking status: Former Smoker    Packs/day: 0.50    Years: 10.00    Pack years: 5.00    Last attempt to quit: 11/19/1979    Years since quitting: 38.0  . Smokeless tobacco: Never Used  Substance and Sexual Activity  . Alcohol use: Yes    Alcohol/week: 8.4 oz    Types: 14  Glasses of wine per week    Comment: 2 glasses of wine with dinner  . Drug use: No  . Sexual activity: Not on file  Other Topics Concern  . Not on file  Social History Narrative  . Not on file    Allergies  Allergen Reactions  . Cinnamon Hives  . Ciprofloxacin Other (See Comments)    TREMORS  . Diovan [Valsartan] Other (See Comments)    Extreme vertigo  . Food Diarrhea and Other (See Comments)    ORANGE JUICE   UPSET STOMACH  . Latex Rash and Other (See Comments)    Rash/inflammation due to exposure  . Nitrofuran Derivatives Hives and Rash    "Full body rash"  . Penicillins Hives and Swelling    *tolerated Ceftriaxone September 2018 Has patient had a PCN reaction causing immediate rash, facial/tongue/throat swelling, SOB or lightheadedness with hypotension:No--severe irritation at the injection site Has patient had a PCN reaction causing severe rash involving mucus membranes or skin necrosis:Unknown Has patient had a PCN reaction that required hospitalization:No Has patient had a PCN reaction occurring within the last 10 years:Yes If all of the above answers are "NO", then may proceed with  . Bactrim [Sulfamethoxazole-Trimethoprim] Diarrhea and Nausea Only  . Other Rash    Mycins  . Sulfa Antibiotics Diarrhea and Nausea Only    Current Facility-Administered Medications  Medication Dose Route Frequency Provider Last Rate Last Dose  . 0.9 %  sodium chloride infusion   Intravenous Continuous Caren Griffins, MD 50 mL/hr at 12/13/17 [phone]    . ceFEPIme (MAXIPIME) 2 g in dextrose 5 % 50 mL IVPB  2 g Intravenous Q24H Desiree Hane, MD   Stopped at 12/13/17 0144  . famotidine (PEPCID) tablet 40 mg  40 mg Oral Q1500 Oretha Milch D, MD      . HYDROmorphone (DILAUDID) injection 0.5-1 mg  0.5-1 mg Intravenous Q4H PRN Caren Griffins, MD   1 mg at 12/13/17 0859  . levothyroxine (SYNTHROID, LEVOTHROID) tablet 175 mcg  175 mcg Oral QAC breakfast Oretha Milch D, MD   175 mcg at  12/13/17 0843  . metroNIDAZOLE (FLAGYL) IVPB 500 mg  500 mg Intravenous Q8H Desiree Hane, MD   Stopped at 12/13/17 [phone]  . ondansetron (ZOFRAN) injection 4 mg  4 mg Intravenous Once Isla Pence, MD      . ondansetron [facility]) tablet 4 mg  4 mg Oral Q6H PRN Oretha Milch D, MD       Or  . ondansetron (ZOFRAN) injection 4 mg  4 mg Intravenous Q6H PRN Desiree Hane, MD      .  oxyCODONE-acetaminophen (PERCOCET/ROXICET) 5-325 MG per tablet 1 tablet  1 tablet Oral Q4H PRN Oretha Milch D, MD   1 tablet at 12/13/17 1106  . senna-docusate (Senokot-S) tablet 1 tablet  1 tablet Oral QHS PRN Desiree Hane, MD      . Derrill Memo ON 12/14/2017] vancomycin (VANCOCIN) 1,250 mg in sodium chloride 0.9 % 250 mL IVPB  1,250 mg Intravenous Q48H Pham, Anh P, RPH      . vancomycin (VANCOCIN) IVPB 1000 mg/200 mL premix  1,000 mg Intravenous NOW Pham, Anh P, RPH        ROS:   General:  No weight loss, Fever, chills  HEENT: No recent headaches, no nasal bleeding, no visual changes, no sore throat  Neurologic: No dizziness, blackouts, seizures. No recent symptoms of stroke or mini- stroke. No recent episodes of slurred speech, or temporary blindness.  Cardiac: No recent episodes of chest pain/pressure, no shortness of breath at rest.  + shortness of breath with exertion.  Denies history of atrial fibrillation or irregular heartbeat  Vascular: No history of rest pain in feet.  No history of claudication.  + history of non-healing ulcer, No history of DVT   Pulmonary: No home oxygen, no productive cough, no hemoptysis,  No asthma or wheezing  Musculoskeletal:  [X]  Arthritis, [X]  Low back pain,  [X]  Joint pain  Hematologic:No history of hypercoagulable state.  No history of easy bleeding.  No history of anemia  Gastrointestinal: No hematochezia or melena,  No gastroesophageal reflux, no trouble swallowing  Urinary: [X]  chronic Kidney disease, [ ]  on HD - [ ]  MWF or [ ]  TTHS, [ ]  Burning with urination,  [ ]  Frequent urination, [ ]  Difficulty urinating;   Skin: No rashes  Psychological: No history of anxiety,  No history of depression   Physical Examination  Vitals:   12/13/17 0231 12/13/17 0511 12/13/17 0529 12/13/17 0743  BP: (!) 119/43 99/80 (!) 153/56 (!) 168/53  Pulse: 65 70 69 82  Resp: 17 18 18    Temp: 98.2 F (36.8 C) 98.1 F (36.7 C) 98.5 F (36.9 C) 99.4 F (37.4 C)  TempSrc: Oral Oral Oral Oral  SpO2: 98% 97% 97% 97%  Weight:      Height:        Body mass index is 40.21 kg/m.  General:  Alert and oriented, no acute distress HEENT: Normal Neck: No JVD Pulmonary: Clear to auscultation bilaterally Cardiac: Regular Rate and Rhythm  Abdomen: Soft, non-tender, non-distended, obese Skin: No rash, full thickness wound adjacent to right knee extending above the knee with surrounding erythema.  Still with staples from prior skin graft which has sloughed, foot pink warm, erythema and tenderness up into the high thigh Tibial ulcer left leg clean Extremity Pulses:  2+ radial, brachial,2+ left femoral, absent right femoral, dorsalis pedis, posterior tibial pulses  Musculoskeletal: No deformity diffuse lower extremity bilateral edema  Neurologic: Upper and lower extremity motor 5/5 and symmetric  DATA:  CBC    Component Value Date/Time   WBC 14.5 (H) 12/12/2017 1527   RBC 2.75 (L) 12/12/2017 1527   HGB 9.2 (L) 12/13/2017 0845   HGB 11.9 04/15/2013 1516   HCT 29.6 (L) 12/13/2017 0845   HCT 38.7 04/15/2013 1516   PLT 459 (H) 12/12/2017 1527   PLT 289 04/15/2013 1516   MCV 79.6 12/12/2017 1527   MCV 88.0 12/25/2014 1540   MCV 89.6 04/15/2013 1516   MCH 24.4 (L) 12/12/2017 1527   MCHC 30.6 12/12/2017 1527  RDW 16.9 (H) 12/12/2017 1527   RDW 15.1 (H) 04/15/2013 1516   LYMPHSABS 1.5 12/12/2017 1527   LYMPHSABS 2.4 04/15/2013 1516   MONOABS 1.6 (H) 12/12/2017 1527   MONOABS 0.6 04/15/2013 1516   EOSABS 0.4 12/12/2017 1527   EOSABS 0.5 04/15/2013 1516   BASOSABS  0.0 12/12/2017 1527   BASOSABS 0.0 04/15/2013 1516    BMET    Component Value Date/Time   NA 132 (L) 12/13/2017 0844   NA 133 (A) 12/11/2017   NA 140 04/15/2013 1516   K 3.5 12/13/2017 0844   K 4.3 04/15/2013 1516   CL 101 12/13/2017 0844   CL 105 04/15/2013 1516   CO2 19 (L) 12/13/2017 0844   CO2 25 04/15/2013 1516   GLUCOSE 91 12/13/2017 0844   GLUCOSE 94 04/15/2013 1516   BUN 16 12/13/2017 0844   BUN 16 12/11/2017   BUN 28.2 (H) 04/15/2013 1516   CREATININE 1.35 (H) 12/13/2017 0844   CREATININE 1.21 (H) 12/25/2014 1530   CREATININE 1.4 (H) 04/15/2013 1516   CALCIUM 8.4 (L) 12/13/2017 0844   CALCIUM 9.9 04/15/2013 1516   GFRNONAA 36 (L) 12/13/2017 0844   GFRNONAA 43 (L) 12/25/2014 1530   GFRAA 42 (L) 12/13/2017 0844   GFRAA 50 (L) 12/25/2014 1530   Prealbumin 6.8  ASSESSMENT:  Non healing wounds with erythema cellulitis right leg.  Pt requesting AKA and I believe leg is non salvageable.  She has ongoing cellulitis and is on antibiotics   PLAN:  Right AKA on Monday 1/28.  Pt may need open aka if tissues appear marginal or infected and required delayed primary or VAC closure  She has severe protein calorie malnutrition which is going to impair wound healing  Will get nutrition to see and supplement   Ruta Hinds, MD [facility] Office: [phone] Pager: [phone]

## 2017-12-13 NOTE — Progress Notes (Signed)
Initial Nutrition Assessment  DOCUMENTATION CODES:   Obesity unspecified  INTERVENTION:   Juven BID MVI Snacks TID   NUTRITION DIAGNOSIS:   Increased nutrient needs related to wound healing as evidenced by estimated needs.  GOAL:   Patient will meet greater than or equal to 90% of their needs  MONITOR:   PO intake, Supplement acceptance, Skin  REASON FOR ASSESSMENT:   Consult Wound healing  ASSESSMENT:   Pt with PMH of CKD stage III, PVD and persistent ulceration of R knee who underwent limb salvage intervention with debridement, skin graft therapy, and wound VAC 1/18 now admitted 1/25 from SNF for worsening R knee pain, septic shock secondary to R knee wound.    Spoke with pt who reports no recent weight loss, good appetite PTA although she has many food preferences and sometimes struggles with food choices at the rehab facilities that she has been in.  Pt ate breakfast but lunch was untouched. Pt states she eats very slowly and sometimes by the time she has finished one meal the other has arrived and it is wasted as she is not hungry yet. Pt likes to eat fresh fruits/veggies to prevent constipation. Reports no issues chewing or swallowing.   Plan for R AKA.  Medications reviewed  Labs reviewed: Na 132 (L), CRP: 22.9 (H)    NUTRITION - FOCUSED PHYSICAL EXAM:    Most Recent Value  Orbital Region  No depletion  Upper Arm Region  No depletion  Thoracic and Lumbar Region  No depletion  Buccal Region  No depletion  Temple Region  No depletion  Clavicle Bone Region  No depletion  Clavicle and Acromion Bone Region  No depletion  Scapular Bone Region  No depletion  Dorsal Hand  No depletion  Patellar Region  Unable to assess  Anterior Thigh Region  Unable to assess  Posterior Calf Region  Unable to assess  Edema (RD Assessment)  Unable to assess  Hair  Reviewed  Eyes  Reviewed  Mouth  Reviewed  Skin  Reviewed  Nails  Reviewed       Diet Order:  Diet regular  Room service appropriate? Yes; Fluid consistency: Thin  EDUCATION NEEDS:   Education needs have been addressed  Skin:  Skin Integrity Issues:: DTI(chronic R knee wound) DTI: ankle  Last BM:  unknown  Height:   Ht Readings from Last 1 Encounters:  12/12/17 5\' 3"  (1.6 m)    Weight:   Wt Readings from Last 1 Encounters:  12/12/17 227 lb (103 kg)    Ideal Body Weight:  52.2 kg  BMI:  Body mass index is 40.21 kg/m.  Estimated Nutritional Needs:   Kcal:  1800-2000  Protein:  100-115 grams  Fluid:  > 1.8 L/day  Maylon Peppers RD, LDN, CNSC [phone] Pager [phone] After Hours Pager

## 2017-12-14 LAB — TYPE AND SCREEN
ABO/RH(D): A POS
Antibody Screen: NEGATIVE
Unit division: 0

## 2017-12-14 LAB — ALBUMIN: ALBUMIN: 2.2 g/dL — AB (ref 3.5–5.0)

## 2017-12-14 LAB — BPAM RBC
Blood Product Expiration Date: 201902132359
ISSUE DATE / TIME: 201901260157
Unit Type and Rh: 6200

## 2017-12-14 LAB — CREATININE, SERUM
CREATININE: 1.31 mg/dL — AB (ref 0.44–1.00)
GFR calc non Af Amer: 37 mL/min — ABNORMAL LOW (ref >60)
GFR, EST AFRICAN AMERICAN: 43 mL/min — AB (ref >60)

## 2017-12-14 MED ORDER — VANCOMYCIN HCL 10 G IV SOLR
1250.0000 mg | INTRAVENOUS | Status: DC
  Administered 2017-12-14 – 2017-12-15 (×2): 1250 mg via INTRAVENOUS
  Filled 2017-12-14 (×3): qty 1250

## 2017-12-14 MED ORDER — HYDROMORPHONE HCL 1 MG/ML IJ SOLN
0.5000 mg | INTRAMUSCULAR | Status: DC | PRN
  Administered 2017-12-14 – 2017-12-17 (×9): 1 mg via INTRAVENOUS
  Filled 2017-12-14 (×9): qty 1

## 2017-12-14 NOTE — Progress Notes (Signed)
PROGRESS NOTE  Shelby Reese XBD:[id number] DOB: 26-Dec-1934 DOA: 12/12/2017 PCP: [name], MD   LOS: 2 days   Brief Narrative / Interim history: 83 y.o. female with medical history significant for PVD and persistent ulceration of right knee (medially) who underwent limb salvage intervention with debridement, skin graft therapy and wound VAC therapy most recently on 12/05/2017  who presents on 12/12/2017 after evaluation from physician at her SNF with worsening right knee pain and malodorous drainage was found to be septic secondary to the right knee wound.  Patient was previously evaluated in the ED on 12/09/17 for increased drainage with foul odor.  ED physicians did not visualize wound due to postoperative dressings, lab work and vitals at the time were stable and unremarkable so plans were made for patient to follow-up with her orthopedic surgeon on 1/24.  Dr. Sharol Given evaluated Shelby Reese on 1/24.  At that time patient stated she cannot tolerate continued compression therapy and current wound care.  Initial plan was for patient to undergo an AKA however patient then requested to cancel surgery as she was seeking a second opinion on wound care, and Dr. Oneida Alar was consulted and agreed to see patient in consultation  Assessment & Plan: Principal Problem:   Sepsis ([place]) Active Problems:   Iron deficiency anemia   CKD (chronic kidney disease), stage III ([facility])   Wound of right leg, sequela   Sepsis secondary to right knee wound infection -Patient status post multiple irrigation and debridements, and previous wound VAC (removed recently).  Low-grade fever, leukocytosis, and likely source meet criteria for sepsis. -Continue wound care -Continue vancomycin, Flagyl and cefepime, sepsis physiology improved -Discussed with Dr. Oneida Alar from vascular surgery, plan for amputation on 1/28  Acute on chronic anemia, iron deficiency -Hemoglobin on admission 6.7, likely in the setting of chronic disease as well  as chronic blood loss from her wound, she received 2 units of packed red blood cells and her hemoglobin is improved to 9.2.  Will repeat tomorrow morning.  Hypertension, relative hypotension, likely related to sepsis -Lactic acid within normal limits.  Status post 1 L bolus normal saline in the ED -Blood pressure has improved to the 150s-160s, stop fluids  CKD stage III, stable -Baseline creatinine 1.4-1.6.  Current current creatinine 1.38 consistent with baseline. -Avoid nephrotoxins, continue to monitor    DVT prophylaxis: SCDs Code Status: Full code Family Communication: no family at bedside Disposition Plan: TBD  Consultants:   Vascular surgery, Dr. Oneida Alar  Procedures:   None   Antimicrobials:  Cefepime 1/25 >>  Vancomycin 1/25 >>   Subjective: -No complaints this morning, denies any chest pain, abdominal pain, nausea or vomiting.  Her right leg has been hurting but controlled with pain medications.  Awaiting surgery tomorrow.  Objective: Vitals:   12/13/17 1422 12/13/17 2017 12/13/17 2200 12/14/17 0527  BP: (!) 131/39 (!) 173/50 (!) 152/47 (!) 165/42  Pulse: 82 95 97 85  Resp: 18 18  18   Temp: 98.5 F (36.9 C) 99.1 F (37.3 C)  99 F (37.2 C)  TempSrc: Oral Oral  Oral  SpO2: 98% 92% (!) 15% 95%  Weight:      Height:        Intake/Output Summary (Last 24 hours) at 12/14/2017 1221 Last data filed at 12/14/2017 0530 Gross per 24 hour  Intake 500 ml  Output 300 ml  Net 200 ml   Filed Weights   12/12/17 1442  Weight: 103 kg (227 lb)    Examination:  Constitutional: NAD Eyes: No scleral icterus ENMT: Moist mucous membranes Respiratory: Clear to auscultation, no wheezing, no crackles. Cardiovascular: Regular rate and rhythm, 3/6 SEM, no lower extremity edema Abdomen: Soft, nontender, nondistended Musculoskeletal: no clubbing / cyanosis. Large necrotic wound medial right knee Neurologic: Nonfocal   Data Reviewed: I have independently reviewed  following labs and imaging studies   CBC: Recent Labs  Lab 12/11/17 12/12/17 1527 12/13/17 0845  WBC 13.3 14.5*  --   NEUTROABS 10 11.0*  --   HGB 7.2* 6.7* 9.2*  HCT 23* 21.9* 29.6*  MCV  --  79.6  --   PLT 498* 459*  --    Basic Metabolic Panel: Recent Labs  Lab 12/11/17 12/12/17 1527 12/13/17 0844 12/14/17 0612  NA 133* 133* 132*  --   K 4.0 3.5 3.5  --   CL  --  101 101  --   CO2  --  24 19*  --   GLUCOSE  --  117* 91  --   BUN 16 18 16   --   CREATININE 1.4* 1.38* 1.35* 1.31*  CALCIUM  --  8.6* 8.4*  --    GFR: Estimated Creatinine Clearance: 39.3 mL/min (A) (by C-G formula based on SCr of 1.31 mg/dL (H)). Liver Function Tests: Recent Labs  Lab 12/12/17 1527 12/14/17 0612  AST 18  --   ALT 13*  --   ALKPHOS 69  --   BILITOT 0.2*  --   PROT 6.3*  --   ALBUMIN 2.5* 2.2*   No results for input(s): LIPASE, AMYLASE in the last 168 hours. No results for input(s): AMMONIA in the last 168 hours. Coagulation Profile: No results for input(s): INR, PROTIME in the last 168 hours. Cardiac Enzymes: No results for input(s): CKTOTAL, CKMB, CKMBINDEX, TROPONINI in the last 168 hours. BNP (last 3 results) No results for input(s): PROBNP in the last 8760 hours. HbA1C: Recent Labs    12/12/17 2350  HGBA1C 5.6   CBG: Recent Labs  Lab 12/08/17 1141 12/13/17 1153 12/13/17 1609  GLUCAP 88 103* 94   Lipid Profile: No results for input(s): CHOL, HDL, LDLCALC, TRIG, CHOLHDL, LDLDIRECT in the last 72 hours. Thyroid Function Tests: No results for input(s): TSH, T4TOTAL, FREET4, T3FREE, THYROIDAB in the last 72 hours. Anemia Panel: No results for input(s): VITAMINB12, FOLATE, FERRITIN, TIBC, IRON, RETICCTPCT in the last 72 hours. Urine analysis:    Component Value Date/Time   COLORURINE AMBER (A) 12/12/2017 1719   APPEARANCEUR CLEAR 12/12/2017 1719   LABSPEC 1.020 12/12/2017 1719   PHURINE 5.0 12/12/2017 1719   GLUCOSEU NEGATIVE 12/12/2017 1719   HGBUR NEGATIVE  12/12/2017 1719   BILIRUBINUR NEGATIVE 12/12/2017 1719   KETONESUR NEGATIVE 12/12/2017 1719   PROTEINUR NEGATIVE 12/12/2017 1719   NITRITE POSITIVE (A) 12/12/2017 1719   LEUKOCYTESUR MODERATE (A) 12/12/2017 1719   Sepsis Labs: Invalid input(s): PROCALCITONIN, LACTICIDVEN  Recent Results (from the past 240 hour(s))  Culture, blood (routine x 2)     Status: None (Preliminary result)   Collection Time: 12/12/17  3:25 PM  Result Value Ref Range Status   Specimen Description BLOOD RIGHT FOREARM  Final   Special Requests IN PEDIATRIC BOTTLE Blood Culture adequate volume  Final   Culture   Final    NO GROWTH < 24 HOURS Performed at [facility], [address], [place], [place] [address]    Report Status PENDING  Incomplete  Culture, blood (routine x 2)     Status: None (  Preliminary result)   Collection Time: 12/12/17  3:26 PM  Result Value Ref Range Status   Specimen Description BLOOD LEFT FOREARM  Final   Special Requests   Final    BOTTLES DRAWN AEROBIC AND ANAEROBIC Blood Culture adequate volume   Culture   Final    NO GROWTH < 24 HOURS Performed at [facility], [address] [place], [place] [address]    Report Status PENDING  Incomplete      Radiology Studies: Dg Knee Complete 4 Views Right  Result Date: 12/12/2017 CLINICAL DATA:  Increased pain, swelling and redness of RIGHT leg EXAM: RIGHT KNEE - COMPLETE 4+ VIEW COMPARISON:  11/12/2017 FINDINGS: Numerous skin clips at medial aspect of RIGHT leg post recent wound management and split thickness skin grafting. Osseous mineralization normal. Medial compartment joint space narrowing. No acute fracture, dislocation, or bone destruction. No knee joint effusion. IMPRESSION: Degenerative changes RIGHT knee. No acute bony abnormalities. Electronically Signed   By: Lavonia Dana M.D.   On: 12/12/2017 17:15     Scheduled Meds: . famotidine  40 mg Oral Q1500  . levothyroxine  175 mcg Oral QAC breakfast  . multivitamin  with minerals  1 tablet Oral Daily  . nutrition supplement (JUVEN)  1 packet Oral BID BM  . ondansetron (ZOFRAN) IV  4 mg Intravenous Once   Continuous Infusions: . ceFEPime (MAXIPIME) IV Stopped (12/14/17 0414)  . metronidazole Stopped (12/14/17 0900)  . vancomycin Stopped (12/14/17 1007)    Marzetta Board, MD, PhD [facility] Pager [phone]  If 7PM-7AM, please contact night-coverage [email or web address] Password [facility] 12/14/2017, 12:21 PM

## 2017-12-14 NOTE — Progress Notes (Signed)
Pt signed both blood transfusion and consent with full understanding.

## 2017-12-14 NOTE — Progress Notes (Signed)
Pharmacy Antibiotic Note  Shelby Reese is a 83 y.o. female with history of PVD and large soft tissue wound of the medial right knee (s/p wound VAC on 12/05/17).  She presented to the ED on 12/10/17 with c/o increased drainage and foul smell from wound and was subsequently discharged with instructions to f/u with Dr. Sharol Given on 12/11/17.  Dr. Sharol Given removed wound VAC on 12/11/17 and recommended right AKA on 12/12/17, but patient refused surgery. She presented back to the ED on 12/12/17 with c/o increased right leg swelling and pain.  Pharmacy has been consulted for vancomycin and cefepime dosing for wound infection.  Patient's renal function is improving slowly.  Afebrile, WBC 14.5, LA 0.61, CRP 23.   Plan: Change to vanc 1250mg  IV Q24H Continue Cefepime 2gm IV Q24H Flagyl 500mg  IV Q8H per MD Monitor renal fxn, clinical progress, vanc trough as indicated F/U abx LOT post right AKA on 12/15/17  Consider resuming home med hydralazine +/- ACEi for better BP control   Height: 5\' 3"  (160 cm) Weight: 227 lb (103 kg) IBW/kg (Calculated) : 52.4  Temp (24hrs), Avg:98.9 F (37.2 C), Min:98.5 F (36.9 C), Max:99.1 F (37.3 C)  Recent Labs  Lab 12/11/17 12/12/17 1527 12/12/17 1531 12/13/17 0844 12/14/17 0612  WBC 13.3 14.5*  --   --   --   CREATININE 1.4* 1.38*  --  1.35* 1.31*  LATICACIDVEN  --   --  0.61  --   --     Estimated Creatinine Clearance: 39.3 mL/min (A) (by C-G formula based on SCr of 1.31 mg/dL (H)).    Allergies  Allergen Reactions  . Cinnamon Hives  . Ciprofloxacin Other (See Comments)    TREMORS  . Diovan [Valsartan] Other (See Comments)    Extreme vertigo  . Food Diarrhea and Other (See Comments)    ORANGE JUICE   UPSET STOMACH  . Latex Rash and Other (See Comments)    Rash/inflammation due to exposure  . Nitrofuran Derivatives Hives and Rash    "Full body rash"  . Penicillins Hives and Swelling    *tolerated Ceftriaxone September 2018 Has patient had a PCN reaction  causing immediate rash, facial/tongue/throat swelling, SOB or lightheadedness with hypotension:No--severe irritation at the injection site Has patient had a PCN reaction causing severe rash involving mucus membranes or skin necrosis:Unknown Has patient had a PCN reaction that required hospitalization:No Has patient had a PCN reaction occurring within the last 10 years:Yes If all of the above answers are "NO", then may proceed with  . Bactrim [Sulfamethoxazole-Trimethoprim] Diarrhea and Nausea Only  . Other Rash    Mycins, Strawberry, Oranges  . Sulfa Antibiotics Diarrhea and Nausea Only    Vanc 1/25 >> Cefepime 1/25 >> Flagyl 1/25 >>  1/25 BCx - NGTD    Mallory Schaad D. Mina Marble, PharmD, BCPS Pager:  [phone] 12/14/2017, 7:46 AM

## 2017-12-15 ENCOUNTER — Ambulatory Visit (INDEPENDENT_AMBULATORY_CARE_PROVIDER_SITE_OTHER): Payer: Medicare Other | Admitting: Family

## 2017-12-15 ENCOUNTER — Inpatient Hospital Stay (HOSPITAL_COMMUNITY): Payer: Medicare Other | Admitting: Anesthesiology

## 2017-12-15 ENCOUNTER — Encounter (HOSPITAL_COMMUNITY): Payer: Self-pay | Admitting: Anesthesiology

## 2017-12-15 ENCOUNTER — Encounter (HOSPITAL_COMMUNITY)
Admission: EM | Disposition: A | Discharge status: Skilled Nursing Facility | Payer: Self-pay | Source: Home / Self Care | Attending: Internal Medicine

## 2017-12-15 DIAGNOSIS — L03115 Cellulitis of right lower limb: Secondary | ICD-10-CM

## 2017-12-15 HISTORY — PX: AMPUTATION: SHX166

## 2017-12-15 LAB — SURGICAL PCR SCREEN
MRSA, PCR: NEGATIVE
Staphylococcus aureus: NEGATIVE

## 2017-12-15 LAB — CBC
HEMATOCRIT: 30.9 % — AB (ref 36.0–46.0)
HEMATOCRIT: 26.5 % — AB (ref 36.0–46.0)
HEMOGLOBIN: 8.3 g/dL — AB (ref 12.0–15.0)
Hemoglobin: 9.4 g/dL — ABNORMAL LOW (ref 12.0–15.0)
MCH: 25.8 pg — AB (ref 26.0–34.0)
MCH: 26.4 pg (ref 26.0–34.0)
MCHC: 30.4 g/dL (ref 30.0–36.0)
MCHC: 31.3 g/dL (ref 30.0–36.0)
MCV: 84.7 fL (ref 78.0–100.0)
MCV: 84.4 fL (ref 78.0–100.0)
Platelets: 422 10*3/uL — ABNORMAL HIGH (ref 150–400)
Platelets: 397 10*3/uL (ref 150–400)
RBC: 3.65 MIL/uL — ABNORMAL LOW (ref 3.87–5.11)
RBC: 3.14 MIL/uL — AB (ref 3.87–5.11)
RDW: 17.3 % — AB (ref 11.5–15.5)
RDW: 17.4 % — AB (ref 11.5–15.5)
WBC: 11.5 10*3/uL — AB (ref 4.0–10.5)
WBC: 13.2 10*3/uL — AB (ref 4.0–10.5)

## 2017-12-15 LAB — PROTIME-INR
INR: 1.1
Prothrombin Time: 14.1 seconds (ref 11.4–15.2)

## 2017-12-15 LAB — COMPREHENSIVE METABOLIC PANEL
ALT: 14 U/L (ref 14–54)
AST: 18 U/L (ref 15–41)
Albumin: 2.4 g/dL — ABNORMAL LOW (ref 3.5–5.0)
Alkaline Phosphatase: 70 U/L (ref 38–126)
Anion gap: 11 (ref 5–15)
BILIRUBIN TOTAL: 0.6 mg/dL (ref 0.3–1.2)
BUN: 20 mg/dL (ref 6–20)
CO2: 21 mmol/L — ABNORMAL LOW (ref 22–32)
CREATININE: 1.3 mg/dL — AB (ref 0.44–1.00)
Calcium: 8.5 mg/dL — ABNORMAL LOW (ref 8.9–10.3)
Chloride: 101 mmol/L (ref 101–111)
GFR calc Af Amer: 44 mL/min — ABNORMAL LOW (ref >60)
GFR, EST NON AFRICAN AMERICAN: 38 mL/min — AB (ref >60)
Glucose, Bld: 107 mg/dL — ABNORMAL HIGH (ref 65–99)
POTASSIUM: 3.4 mmol/L — AB (ref 3.5–5.1)
Sodium: 133 mmol/L — ABNORMAL LOW (ref 135–145)
TOTAL PROTEIN: 6.3 g/dL — AB (ref 6.5–8.1)

## 2017-12-15 LAB — CREATININE, SERUM
Creatinine, Ser: 1.19 mg/dL — ABNORMAL HIGH (ref 0.44–1.00)
GFR, EST AFRICAN AMERICAN: 49 mL/min — AB (ref >60)
GFR, EST NON AFRICAN AMERICAN: 42 mL/min — AB (ref >60)

## 2017-12-15 SURGERY — AMPUTATION, ABOVE KNEE
Anesthesia: General | Laterality: Right

## 2017-12-15 MED ORDER — PANTOPRAZOLE SODIUM 40 MG PO TBEC
40.0000 mg | DELAYED_RELEASE_TABLET | Freq: Every day | ORAL | Status: DC
  Administered 2017-12-15 – 2017-12-23 (×9): 40 mg via ORAL
  Filled 2017-12-15 (×9): qty 1

## 2017-12-15 MED ORDER — EPHEDRINE SULFATE 50 MG/ML IJ SOLN
INTRAMUSCULAR | Status: DC | PRN
  Administered 2017-12-15 (×2): 10 mg via INTRAVENOUS

## 2017-12-15 MED ORDER — PROPOFOL 10 MG/ML IV BOLUS
INTRAVENOUS | Status: DC | PRN
  Administered 2017-12-15: 60 mg via INTRAVENOUS
  Administered 2017-12-15: 40 mg via INTRAVENOUS

## 2017-12-15 MED ORDER — POTASSIUM CHLORIDE CRYS ER 20 MEQ PO TBCR: 20.0000 meq | EXTENDED_RELEASE_TABLET | Freq: Every day | ORAL | Status: DC | PRN

## 2017-12-15 MED ORDER — FENTANYL CITRATE (PF) 100 MCG/2ML IJ SOLN
INTRAMUSCULAR | Status: DC | PRN
  Administered 2017-12-15: 50 ug via INTRAVENOUS
  Administered 2017-12-15 (×2): 100 ug via INTRAVENOUS

## 2017-12-15 MED ORDER — 0.9 % SODIUM CHLORIDE (POUR BTL) OPTIME
TOPICAL | Status: DC | PRN
  Administered 2017-12-15: 2000 mL
  Administered 2017-12-15: 1000 mL

## 2017-12-15 MED ORDER — HYDROMORPHONE HCL 1 MG/ML IJ SOLN
INTRAMUSCULAR | Status: AC
  Administered 2017-12-15: 0.5 mg via INTRAVENOUS
  Filled 2017-12-15: qty 1

## 2017-12-15 MED ORDER — LIDOCAINE HCL (CARDIAC) 20 MG/ML IV SOLN
INTRAVENOUS | Status: DC | PRN
  Administered 2017-12-15: 60 mg via INTRAVENOUS

## 2017-12-15 MED ORDER — DOCUSATE SODIUM 100 MG PO CAPS
100.0000 mg | ORAL_CAPSULE | Freq: Every day | ORAL | Status: DC
  Administered 2017-12-16 – 2017-12-23 (×6): 100 mg via ORAL
  Filled 2017-12-15 (×8): qty 1

## 2017-12-15 MED ORDER — LIDOCAINE 2% (20 MG/ML) 5 ML SYRINGE
INTRAMUSCULAR | Status: AC
  Filled 2017-12-15: qty 5

## 2017-12-15 MED ORDER — HYDRALAZINE HCL 20 MG/ML IJ SOLN: 5.0000 mg | INTRAMUSCULAR | Status: DC | PRN

## 2017-12-15 MED ORDER — ACETAMINOPHEN 325 MG PO TABS: 325.0000 mg | ORAL_TABLET | ORAL | Status: DC | PRN

## 2017-12-15 MED ORDER — ALUM & MAG HYDROXIDE-SIMETH 200-200-20 MG/5ML PO SUSP: 15.0000 mL | ORAL | Status: DC | PRN

## 2017-12-15 MED ORDER — LACTATED RINGERS IV SOLN
INTRAVENOUS | Status: DC
  Administered 2017-12-15 (×2): via INTRAVENOUS

## 2017-12-15 MED ORDER — GUAIFENESIN-DM 100-10 MG/5ML PO SYRP: 15.0000 mL | ORAL_SOLUTION | ORAL | Status: DC | PRN

## 2017-12-15 MED ORDER — MORPHINE SULFATE (PF) 2 MG/ML IV SOLN
2.0000 mg | INTRAVENOUS | Status: DC | PRN
  Administered 2017-12-15 – 2017-12-16 (×2): 2 mg via INTRAVENOUS
  Administered 2017-12-16: 4 mg via INTRAVENOUS
  Administered 2017-12-16: 2 mg via INTRAVENOUS
  Filled 2017-12-15: qty 2
  Filled 2017-12-15 (×3): qty 1

## 2017-12-15 MED ORDER — PROPOFOL 10 MG/ML IV BOLUS
INTRAVENOUS | Status: AC
  Filled 2017-12-15: qty 20

## 2017-12-15 MED ORDER — FENTANYL CITRATE (PF) 100 MCG/2ML IJ SOLN
INTRAMUSCULAR | Status: AC
  Administered 2017-12-15: 100 ug via INTRAVENOUS
  Filled 2017-12-15: qty 2

## 2017-12-15 MED ORDER — PHENYLEPHRINE HCL 10 MG/ML IJ SOLN
INTRAMUSCULAR | Status: DC | PRN
  Administered 2017-12-15 (×2): 120 ug via INTRAVENOUS

## 2017-12-15 MED ORDER — HYDROMORPHONE HCL 1 MG/ML IJ SOLN
0.2500 mg | INTRAMUSCULAR | Status: DC | PRN
  Administered 2017-12-15 (×3): 0.5 mg via INTRAVENOUS

## 2017-12-15 MED ORDER — HEPARIN SODIUM (PORCINE) 5000 UNIT/ML IJ SOLN
5000.0000 [IU] | Freq: Three times a day (TID) | INTRAMUSCULAR | Status: DC
  Administered 2017-12-15 – 2017-12-23 (×17): 5000 [IU] via SUBCUTANEOUS
  Filled 2017-12-15 (×19): qty 1

## 2017-12-15 MED ORDER — LABETALOL HCL 5 MG/ML IV SOLN
10.0000 mg | INTRAVENOUS | Status: DC | PRN
  Filled 2017-12-15: qty 4

## 2017-12-15 MED ORDER — ACETAMINOPHEN 650 MG RE SUPP
325.0000 mg | RECTAL | Status: DC | PRN
Start: 2017-12-15 — End: 2017-12-23

## 2017-12-15 MED ORDER — FENTANYL CITRATE (PF) 100 MCG/2ML IJ SOLN
100.0000 ug | Freq: Once | INTRAMUSCULAR | Status: AC
Start: 2017-12-15 — End: 2017-12-15
  Administered 2017-12-15: 100 ug via INTRAVENOUS

## 2017-12-15 MED ORDER — MAGNESIUM SULFATE 2 GM/50ML IV SOLN
2.0000 g | Freq: Every day | INTRAVENOUS | Status: DC | PRN
  Filled 2017-12-15: qty 50

## 2017-12-15 MED ORDER — ONDANSETRON HCL 4 MG/2ML IJ SOLN
INTRAMUSCULAR | Status: DC | PRN
  Administered 2017-12-15: 4 mg via INTRAVENOUS

## 2017-12-15 MED ORDER — FENTANYL CITRATE (PF) 250 MCG/5ML IJ SOLN
INTRAMUSCULAR | Status: AC
  Filled 2017-12-15: qty 5

## 2017-12-15 MED ORDER — METOPROLOL TARTRATE 5 MG/5ML IV SOLN: 2.0000 mg | INTRAVENOUS | Status: DC | PRN

## 2017-12-15 MED ORDER — DEXTROSE-NACL 5-0.45 % IV SOLN
INTRAVENOUS | Status: DC
  Administered 2017-12-15: 18:00:00 via INTRAVENOUS

## 2017-12-15 MED ORDER — PHENOL 1.4 % MT LIQD: 1.0000 | OROMUCOSAL | Status: DC | PRN

## 2017-12-15 MED ORDER — LACTATED RINGERS IV SOLN: INTRAVENOUS | Status: DC

## 2017-12-15 SURGICAL SUPPLY — 46 items
BANDAGE ACE 6X5 VEL STRL LF (GAUZE/BANDAGES/DRESSINGS) ×2 IMPLANT
BANDAGE ELASTIC 6 VELCRO ST LF (GAUZE/BANDAGES/DRESSINGS) ×1 IMPLANT
BLADE SAW RECIP 87.9 MT (BLADE) ×2 IMPLANT
BNDG CMPR MED 10X6 ELC LF (GAUZE/BANDAGES/DRESSINGS) ×1
BNDG COHESIVE 4X5 TAN STRL (GAUZE/BANDAGES/DRESSINGS) ×2 IMPLANT
BNDG COHESIVE 6X5 TAN STRL LF (GAUZE/BANDAGES/DRESSINGS) ×2 IMPLANT
BNDG ELASTIC 6X10 VLCR STRL LF (GAUZE/BANDAGES/DRESSINGS) ×1 IMPLANT
BNDG GAUZE ELAST 4 BULKY (GAUZE/BANDAGES/DRESSINGS) ×2 IMPLANT
CANISTER SUCT 3000ML PPV (MISCELLANEOUS) ×2 IMPLANT
CLIP VESOCCLUDE MED 6/CT (CLIP) IMPLANT
COVER BACK TABLE 60X90IN (DRAPES) ×2 IMPLANT
COVER SURGICAL LIGHT HANDLE (MISCELLANEOUS) ×2 IMPLANT
DRAIN CHANNEL 19F RND (DRAIN) IMPLANT
DRAPE HALF SHEET 40X57 (DRAPES) ×2 IMPLANT
DRAPE ORTHO SPLIT 77X108 STRL (DRAPES) ×4
DRAPE SURG ORHT 6 SPLT 77X108 (DRAPES) ×2 IMPLANT
DRSG ADAPTIC 3X8 NADH LF (GAUZE/BANDAGES/DRESSINGS) ×2 IMPLANT
ELECT CAUTERY BLADE 6.4 (BLADE) ×2 IMPLANT
ELECT REM PT RETURN 9FT ADLT (ELECTROSURGICAL) ×2
ELECTRODE REM PT RTRN 9FT ADLT (ELECTROSURGICAL) ×1 IMPLANT
EVACUATOR SILICONE 100CC (DRAIN) IMPLANT
GAUZE SPONGE 4X4 12PLY STRL (GAUZE/BANDAGES/DRESSINGS) ×2 IMPLANT
GLOVE BIO SURGEON STRL SZ7.5 (GLOVE) ×2 IMPLANT
GLOVE SURG SS PI 7.0 STRL IVOR (GLOVE) ×2 IMPLANT
GOWN STRL REUS W/ TWL LRG LVL3 (GOWN DISPOSABLE) ×3 IMPLANT
GOWN STRL REUS W/TWL LRG LVL3 (GOWN DISPOSABLE) ×6
KIT BASIN OR (CUSTOM PROCEDURE TRAY) ×2 IMPLANT
KIT ROOM TURNOVER OR (KITS) ×2 IMPLANT
NS IRRIG 1000ML POUR BTL (IV SOLUTION) ×2 IMPLANT
PACK GENERAL/GYN (CUSTOM PROCEDURE TRAY) ×2 IMPLANT
PAD ARMBOARD 7.5X6 YLW CONV (MISCELLANEOUS) ×4 IMPLANT
SPONGE LAP 18X18 X RAY DECT (DISPOSABLE) ×1 IMPLANT
STAPLER VISISTAT 35W (STAPLE) ×4 IMPLANT
STOCKINETTE IMPERVIOUS LG (DRAPES) ×2 IMPLANT
SUT BONE WAX W31G (SUTURE) ×1 IMPLANT
SUT ETHILON 3 0 PS 1 (SUTURE) IMPLANT
SUT SILK 2 0 SH (SUTURE) IMPLANT
SUT SILK 2 0 SH CR/8 (SUTURE) ×3 IMPLANT
SUT SILK 2 0 TIES 10X30 (SUTURE) ×2 IMPLANT
SUT VIC AB 2-0 CT1 18 (SUTURE) ×1 IMPLANT
SUT VIC AB 2-0 SH 18 (SUTURE) ×5 IMPLANT
SUT VIC AB 3-0 SH 27 (SUTURE) ×8
SUT VIC AB 3-0 SH 27X BRD (SUTURE) ×2 IMPLANT
TOWEL GREEN STERILE (TOWEL DISPOSABLE) ×4 IMPLANT
UNDERPAD 30X30 (UNDERPADS AND DIAPERS) ×2 IMPLANT
WATER STERILE IRR 1000ML POUR (IV SOLUTION) ×2 IMPLANT

## 2017-12-15 NOTE — Transfer of Care (Signed)
Immediate Anesthesia Transfer of Care Note  Patient: Shelby Reese  Procedure(s) Performed: AMPUTATION ABOVE KNEE, RIGHT (Right )  Patient Location: PACU  Anesthesia Type:General  Level of Consciousness: awake, alert  and oriented  Airway & Oxygen Therapy: Patient Spontanous Breathing and Patient connected to nasal cannula oxygen  Post-op Assessment: Report given to RN and Post -op Vital signs reviewed and stable  Post vital signs: Reviewed and stable  Last Vitals:  Vitals:   12/15/17 1300 12/15/17 1651  BP: (!) 122/45   Pulse: 85   Resp: 16   Temp: 37.9 C (P) 37.3 C  SpO2: 95%     Last Pain:  Vitals:   12/15/17 1651  TempSrc:   PainSc: (P) Asleep      Patients Stated Pain Goal: 1 (94/50/38 8828)  Complications: No apparent anesthesia complications

## 2017-12-15 NOTE — Anesthesia Postprocedure Evaluation (Signed)
Anesthesia Post Note  Patient: [name]  Procedure(s) Performed: AMPUTATION ABOVE KNEE, RIGHT (Right )     Patient location during evaluation: PACU Anesthesia Type: General Level of consciousness: awake and alert Pain management: pain level controlled Vital Signs Assessment: post-procedure vital signs reviewed and stable Respiratory status: spontaneous breathing, nonlabored ventilation, respiratory function stable and patient connected to nasal cannula oxygen Cardiovascular status: blood pressure returned to baseline and stable Postop Assessment: no apparent nausea or vomiting Anesthetic complications: no    Last Vitals:  Vitals:   12/15/17 1720 12/15/17 1831  BP: 139/61 (!) 138/46  Pulse: 86 89  Resp: 10 15  Temp:  36.8 C  SpO2: 97% 98%    Last Pain:  Vitals:   12/15/17 1855  TempSrc:   PainSc: 10-Worst pain ever                 [name]

## 2017-12-15 NOTE — Plan of Care (Signed)
  Progressing Education: Knowledge of General Education information will improve 12/15/2017 1606 - Progressing by Darletta Moll, RN Health Behavior/Discharge Planning: Ability to manage health-related needs will improve 12/15/2017 1606 - Progressing by Darletta Moll, RN Clinical Measurements: Ability to maintain clinical measurements within normal limits will improve 12/15/2017 1606 - Progressing by Darletta Moll, RN Will remain free from infection 12/15/2017 1606 - Progressing by Darletta Moll, RN Diagnostic test results will improve 12/15/2017 1606 - Progressing by Darletta Moll, RN Respiratory complications will improve 12/15/2017 1606 - Progressing by Darletta Moll, RN Cardiovascular complication will be avoided 12/15/2017 1606 - Progressing by Darletta Moll, RN Nutrition: Adequate nutrition will be maintained 12/15/2017 1606 - Progressing by Darletta Moll, RN Elimination: Will not experience complications related to bowel motility 12/15/2017 1606 - Progressing by Darletta Moll, RN Will not experience complications related to urinary retention 12/15/2017 1606 - Progressing by Darletta Moll, RN Pain Managment: General experience of comfort will improve 12/15/2017 1606 - Progressing by Darletta Moll, RN Safety: Ability to remain free from injury will improve 12/15/2017 1606 - Progressing by Darletta Moll, RN Skin Integrity: Risk for impaired skin integrity will decrease 12/15/2017 1606 - Progressing by Darletta Moll, RN

## 2017-12-15 NOTE — Interval H&P Note (Signed)
History and Physical Interval Note:  12/15/2017 2:30 PM  Shelby Reese  has presented today for surgery, with the diagnosis of GANGRENE, LEFT LEG  The various methods of treatment have been discussed with the patient and family. After consideration of risks, benefits and other options for treatment, the patient has consented to  Procedure(s): AMPUTATION ABOVE KNEE, RIGHT (Right) as a surgical intervention .  The patient's history has been reviewed, patient examined, no change in status, stable for surgery.  I have reviewed the patient's chart and labs.  Questions were answered to the patient's satisfaction.     Ruta Hinds

## 2017-12-15 NOTE — Clinical Social Work Note (Signed)
Clinical Social Work Assessment  Patient Details  Name: Shelby Reese MRN: [id number] Date of Birth: Apr 20, 1935  Date of referral:  12/15/17               Reason for consult:  Facility Placement                Permission sought to share information with:  Chartered certified accountant granted to share information::  Yes, Verbal Permission Granted  Name::     Shelby Reese  Agency::  [facility]  Relationship::  spouse  Contact Information:     Housing/Transportation Living arrangements for the past 2 months:  [place] of Information:  Patient, Facility Patient Interpreter Needed:  None Criminal Activity/Legal Involvement Pertinent to Current Situation/Hospitalization:  No - Comment as needed Significant Relationships:  Church, Spouse Lives with:  Spouse Do you feel safe going back to the place where you live?  No Need for family participation in patient care:  No (Coment)  Care giving concerns:  Pt went to [facility] last week and returned for Right AKA. Pt indicated that she agreed to return to SNF for care. CSW will f/u with SNF to ensure that they will be able to take patient back for rehab.  CSW obtained permission to send offer back to [facility]. Pt will discharge when medically ready.  Social Worker assessment / plan:  CSW will f/u for disposition.  Employment status:  Disabled (Comment on whether or not currently receiving Disability) Insurance information:  Medicare PT Recommendations:  [place] / Referral to community resources:  [place]  Patient/Family's Response to care:  Patient appreciative of CSW coming to discuss return to SNF. Pt indicated that she had a good experience at [facility] and desires to return to SNF.  Patient/Family's Understanding of and Emotional Response to Diagnosis, Current Treatment, and Prognosis:  Pt understands her impairment as she has a few procedures to that right  leg. Pt acknowledges her need for SNF as she was recently transitioned there for care. Pt agreeable to return and reports that she had nice staff. CSW will f/u for discharge when medically ready.  Emotional Assessment Appearance:  Appears stated age Attitude/Demeanor/Rapport:  (Appropriate) Affect (typically observed):  Accepting, Appropriate Orientation:  Oriented to Self, Oriented to Place, Oriented to  Time, Oriented to Situation Alcohol / Substance use:  Not Applicable Psych involvement (Current and /or in the community):  No (Comment)  Discharge Needs  Concerns to be addressed:  Discharge Planning Concerns Readmission within the last 30 days:  Yes Current discharge risk:  Dependent with Mobility, Physical Impairment Barriers to Discharge:  No Barriers Identified   Normajean Baxter, LCSW 12/15/2017, 1:25 PM

## 2017-12-15 NOTE — Op Note (Signed)
[facility] OPERATIVE NOTE   Procedure: Right above knee amputation  Surgeon(s): Elam Dutch, MD  ASSISTANT: Jasmine Awe RNFA  Anesthesia: General  Specimens: Right leg  PROCEDURE DETAIL: After obtaining informed consent, the patient was taken to the operating room. The patient was placed in supine position the operating room table. After induction of general anesthesia and endotracheal intubation the patient's Foley catheter was placed. Next patient's entire right lower extremity was prepped and draped in usual sterile fashion. A circumferential incision was made on the right leg just above the knee. The incision was carried down into the sucutaneous tissues down to level the saphenous vein. This was ligated and divided between silk ties. Soft tissues were taken down as well as the muscle and fascia with cautery. The superficial femoral artery and vein were dissected free circumferentially clamped and divided. These were suture ligated proximally. Remainder of the soft tissues were taken down with cautery. The periosteum was raised on the femur approximately 5 cm above the skin edge. The femur was divided at this level. The leg was passed off the table as a specimen. Hemostasis was obtained. The wound was thoroughly irrigated with normal saline solution. The fascial edges were reapproximated using interrupted 2 0 Vicryl sutures. The subcutaneous tissues reapproximated using a running 3-0 Vicryl suture. The skin was closed staples. Patient tolerated procedure well and there were no complications. Instrument sponge and needle counts correct in the case. Patient was taken to recovery in stable condition.  Ruta Hinds, MD [facility] Office: [phone] Pager: [phone]

## 2017-12-15 NOTE — Progress Notes (Signed)
Patient off floor to OR

## 2017-12-15 NOTE — Progress Notes (Signed)
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 From the time spent in conversation I have come to discover that she greatly values critical thinking, questions and she is empowered by knowing exactly what is happening with the care plan. She expressed a desire to make contact with a Contractor who rounds on patients. Chaplain made contact with the woman from the [place] who is visiting patients today and made a special request for a visit on the patient's behalf.

## 2017-12-15 NOTE — Progress Notes (Signed)
PROGRESS NOTE  Shelby Reese SWN:[id number] DOB: May 01, 1935 DOA: 12/12/2017 PCP: [name], MD   LOS: 3 days   Brief Narrative / Interim history: 83 y.o. female with medical history significant for PVD and persistent ulceration of right knee (medially) who underwent limb salvage intervention with debridement, skin graft therapy and wound VAC therapy most recently on 12/05/2017  who presents on 12/12/2017 after evaluation from physician at her SNF with worsening right knee pain and malodorous drainage was found to be septic secondary to the right knee wound.  Patient was previously evaluated in the ED on 12/09/17 for increased drainage with foul odor.  ED physicians did not visualize wound due to postoperative dressings, lab work and vitals at the time were stable and unremarkable so plans were made for patient to follow-up with her orthopedic surgeon on 1/24.  Dr. Sharol Given evaluated [name] on 1/24.  At that time patient stated she cannot tolerate continued compression therapy and current wound care.  Initial plan was for patient to undergo an AKA however patient then requested to cancel surgery as she was seeking a second opinion on wound care, and Dr. Oneida Alar was consulted and agreed to see patient in consultation  Assessment & Plan: Principal Problem:   Sepsis ([place]) Active Problems:   Iron deficiency anemia   CKD (chronic kidney disease), stage III (HCC)   Wound of right leg, sequela   Sepsis secondary to right knee wound infection -Patient status post multiple irrigation and debridements, and previous wound VAC (removed recently).  Low-grade fever, leukocytosis, and likely source meet criteria for sepsis. -Continue wound care -Continue vancomycin, Flagyl and cefepime, sepsis physiology improved -Discussed with Dr. Oneida Alar from vascular surgery, plan for amputation today  Acute on chronic anemia, iron deficiency -Hemoglobin on admission 6.7, likely in the setting of chronic disease as well  as chronic blood loss from her wound, she received 2 units of packed red blood cells  -Her hemoglobin is stable at 9.4 this morning  Hypertension, relative hypotension, likely related to sepsis -Lactic acid within normal limits.  Status post 1 L bolus normal saline in the ED -Blood pressure is now normalized  CKD stage III, stable -Baseline creatinine 1.4-1.6.   -Currently creatinine 1.3, at baseline -Avoid nephrotoxins, continue to monitor    DVT prophylaxis: SCDs Code Status: Full code Family Communication: no family at bedside Disposition Plan: TBD  Consultants:   Vascular surgery, Dr. Oneida Alar  Procedures:   None   Antimicrobials:  Cefepime 1/25 >>  Vancomycin 1/25 >>   Subjective: -Awaiting surgery today, complains of a dry mouth otherwise no pain no significant discomfort  Objective: Vitals:   12/14/17 0527 12/14/17 1542 12/14/17 2141 12/15/17 0733  BP: (!) 165/42 (!) 129/45 (!) 124/46 (!) 137/50  Pulse: 85 95 88 93  Resp: 18 18 18 16   Temp: 99 F (37.2 C) (!) 100.5 F (38.1 C) 98 F (36.7 C) 99.5 F (37.5 C)  TempSrc: Oral Oral Oral Oral  SpO2: 95% 96% 95% 99%  Weight:      Height:        Intake/Output Summary (Last 24 hours) at 12/15/2017 1123 Last data filed at 12/15/2017 9381 Gross per 24 hour  Intake 1230 ml  Output -  Net 1230 ml   Filed Weights   12/12/17 1442  Weight: 103 kg (227 lb)    Examination:  Constitutional: No distress Eyes: No scleral icterus, lids and conjunctivae normal ENMT: Moist mucous membranes Respiratory: Clear to auscultation, no wheezing,  no crackles. Cardiovascular: Regular rate and rhythm, 3/6 SEM, no edema Abdomen: Soft, nontender, nondistended Musculoskeletal: Large necrotic wound right knee, dressing clean and intact Neurologic: No focal findings   Data Reviewed: I have independently reviewed following labs and imaging studies   CBC: Recent Labs  Lab 12/11/17 12/12/17 1527 12/13/17 0845  12/15/17 0716  WBC 13.3 14.5*  --  11.5*  NEUTROABS 10 11.0*  --   --   HGB 7.2* 6.7* 9.2* 9.4*  HCT 23* 21.9* 29.6* 30.9*  MCV  --  79.6  --  84.7  PLT 498* 459*  --  932*   Basic Metabolic Panel: Recent Labs  Lab 12/11/17 12/12/17 1527 12/13/17 0844 12/14/17 0612 12/15/17 0716  NA 133* 133* 132*  --  133*  K 4.0 3.5 3.5  --  3.4*  CL  --  101 101  --  101  CO2  --  24 19*  --  21*  GLUCOSE  --  117* 91  --  107*  BUN 16 18 16   --  20  CREATININE 1.4* 1.38* 1.35* 1.31* 1.30*  CALCIUM  --  8.6* 8.4*  --  8.5*   GFR: Estimated Creatinine Clearance: 39.6 mL/min (A) (by C-G formula based on SCr of 1.3 mg/dL (H)). Liver Function Tests: Recent Labs  Lab 12/12/17 1527 12/14/17 0612 12/15/17 0716  AST 18  --  18  ALT 13*  --  14  ALKPHOS 69  --  70  BILITOT 0.2*  --  0.6  PROT 6.3*  --  6.3*  ALBUMIN 2.5* 2.2* 2.4*   No results for input(s): LIPASE, AMYLASE in the last 168 hours. No results for input(s): AMMONIA in the last 168 hours. Coagulation Profile: Recent Labs  Lab 12/15/17 0716  INR 1.10   Cardiac Enzymes: No results for input(s): CKTOTAL, CKMB, CKMBINDEX, TROPONINI in the last 168 hours. BNP (last 3 results) No results for input(s): PROBNP in the last 8760 hours. HbA1C: Recent Labs    12/12/17 2350  HGBA1C 5.6   CBG: Recent Labs  Lab 12/08/17 1141 12/13/17 1153 12/13/17 1609  GLUCAP 88 103* 94   Lipid Profile: No results for input(s): CHOL, HDL, LDLCALC, TRIG, CHOLHDL, LDLDIRECT in the last 72 hours. Thyroid Function Tests: No results for input(s): TSH, T4TOTAL, FREET4, T3FREE, THYROIDAB in the last 72 hours. Anemia Panel: No results for input(s): VITAMINB12, FOLATE, FERRITIN, TIBC, IRON, RETICCTPCT in the last 72 hours. Urine analysis:    Component Value Date/Time   COLORURINE AMBER (A) 12/12/2017 1719   APPEARANCEUR CLEAR 12/12/2017 1719   LABSPEC 1.020 12/12/2017 1719   PHURINE 5.0 12/12/2017 1719   GLUCOSEU NEGATIVE 12/12/2017 1719    HGBUR NEGATIVE 12/12/2017 1719   BILIRUBINUR NEGATIVE 12/12/2017 1719   KETONESUR NEGATIVE 12/12/2017 1719   PROTEINUR NEGATIVE 12/12/2017 1719   NITRITE POSITIVE (A) 12/12/2017 1719   LEUKOCYTESUR MODERATE (A) 12/12/2017 1719   Sepsis Labs: Invalid input(s): PROCALCITONIN, LACTICIDVEN  Recent Results (from the past 240 hour(s))  Culture, blood (routine x 2)     Status: None (Preliminary result)   Collection Time: 12/12/17  3:25 PM  Result Value Ref Range Status   Specimen Description BLOOD RIGHT FOREARM  Final   Special Requests IN PEDIATRIC BOTTLE Blood Culture adequate volume  Final   Culture   Final    NO GROWTH 2 DAYS Performed at [facility], [address] [place], [place] [address]    Report Status PENDING  Incomplete  Culture,  blood (routine x 2)     Status: None (Preliminary result)   Collection Time: 12/12/17  3:26 PM  Result Value Ref Range Status   Specimen Description BLOOD LEFT FOREARM  Final   Special Requests   Final    BOTTLES DRAWN AEROBIC AND ANAEROBIC Blood Culture adequate volume   Culture   Final    NO GROWTH 2 DAYS Performed at [facility], [address], [place], [place] [address]    Report Status PENDING  Incomplete      Radiology Studies: No results found.   Scheduled Meds: . famotidine  40 mg Oral Q1500  . levothyroxine  175 mcg Oral QAC breakfast  . multivitamin with minerals  1 tablet Oral Daily  . nutrition supplement (JUVEN)  1 packet Oral BID BM  . ondansetron (ZOFRAN) IV  4 mg Intravenous Once   Continuous Infusions: . ceFEPime (MAXIPIME) IV Stopped (12/15/17 0531)  . metronidazole Stopped (12/15/17 7867)  . vancomycin 1,250 mg (12/15/17 5449)    Marzetta Board, MD, PhD [facility] Pager [phone]  If 7PM-7AM, please contact night-coverage [email or web address] Password [facility] 12/15/2017, 11:23 AM

## 2017-12-15 NOTE — Anesthesia Preprocedure Evaluation (Addendum)
Anesthesia Evaluation  Patient identified by MRN, date of birth, ID band Patient awake    Reviewed: Allergy & Precautions, H&P , NPO status , Patient's Chart, lab work & pertinent test results  Airway Mallampati: II  TM Distance: >3 FB Neck ROM: Full    Dental no notable dental hx. (+) Teeth Intact, Dental Advisory Given   Pulmonary neg pulmonary ROS, former smoker,    Pulmonary exam normal breath sounds clear to auscultation       Cardiovascular hypertension, Pt. on medications + Peripheral Vascular Disease   Rhythm:Regular Rate:Normal     Neuro/Psych negative neurological ROS  negative psych ROS   GI/Hepatic Neg liver ROS, GERD  Medicated and Controlled,  Endo/Other  Hypothyroidism Morbid obesity  Renal/GU Renal InsufficiencyRenal disease  negative genitourinary   Musculoskeletal  (+) Arthritis , Osteoarthritis,  Fibromyalgia -  Abdominal (+) + obese,   Peds  Hematology negative hematology ROS (+) anemia ,   Anesthesia Other Findings   Reproductive/Obstetrics negative OB ROS                          Anesthesia Physical  Anesthesia Plan  ASA: III  Anesthesia Plan: General   Post-op Pain Management:    Induction: Intravenous  PONV Risk Score and Plan: 3 and Ondansetron, Dexamethasone and Treatment may vary due to age or medical condition  Airway Management Planned: Oral ETT and LMA  Additional Equipment:   Intra-op Plan:   Post-operative Plan: Extubation in OR  Informed Consent: I have reviewed the patients History and Physical, chart, labs and discussed the procedure including the risks, benefits and alternatives for the proposed anesthesia with the patient or authorized representative who has indicated his/her understanding and acceptance.   Dental advisory given  Plan Discussed with: CRNA  Anesthesia Plan Comments:        Anesthesia Quick Evaluation

## 2017-12-16 ENCOUNTER — Encounter (HOSPITAL_COMMUNITY): Payer: Self-pay | Admitting: Vascular Surgery

## 2017-12-16 ENCOUNTER — Inpatient Hospital Stay (HOSPITAL_COMMUNITY): Payer: Medicare Other

## 2017-12-16 DIAGNOSIS — R0600 Dyspnea, unspecified: Secondary | ICD-10-CM

## 2017-12-16 DIAGNOSIS — A408 Other streptococcal sepsis: Secondary | ICD-10-CM

## 2017-12-16 LAB — CBC
HCT: 26.5 % — ABNORMAL LOW (ref 36.0–46.0)
HEMOGLOBIN: 8 g/dL — AB (ref 12.0–15.0)
MCH: 25.3 pg — AB (ref 26.0–34.0)
MCHC: 30.2 g/dL (ref 30.0–36.0)
MCV: 83.9 fL (ref 78.0–100.0)
PLATELETS: 381 10*3/uL (ref 150–400)
RBC: 3.16 MIL/uL — AB (ref 3.87–5.11)
RDW: 17 % — ABNORMAL HIGH (ref 11.5–15.5)
WBC: 17.4 10*3/uL — AB (ref 4.0–10.5)

## 2017-12-16 LAB — BPAM RBC
BLOOD PRODUCT EXPIRATION DATE: 201902112359
Blood Product Expiration Date: 201902092359
ISSUE DATE / TIME: 201901251822
UNIT TYPE AND RH: 6200
Unit Type and Rh: 6200

## 2017-12-16 LAB — BASIC METABOLIC PANEL
ANION GAP: 10 (ref 5–15)
BUN: 17 mg/dL (ref 6–20)
CHLORIDE: 100 mmol/L — AB (ref 101–111)
CO2: 21 mmol/L — ABNORMAL LOW (ref 22–32)
Calcium: 8.1 mg/dL — ABNORMAL LOW (ref 8.9–10.3)
Creatinine, Ser: 1.02 mg/dL — ABNORMAL HIGH (ref 0.44–1.00)
GFR, EST AFRICAN AMERICAN: 59 mL/min — AB (ref >60)
GFR, EST NON AFRICAN AMERICAN: 51 mL/min — AB (ref >60)
Glucose, Bld: 155 mg/dL — ABNORMAL HIGH (ref 65–99)
POTASSIUM: 4 mmol/L (ref 3.5–5.1)
SODIUM: 131 mmol/L — AB (ref 135–145)

## 2017-12-16 LAB — TYPE AND SCREEN
ABO/RH(D): A POS
Antibody Screen: NEGATIVE
UNIT DIVISION: 0
Unit division: 0

## 2017-12-16 LAB — ECHOCARDIOGRAM COMPLETE
HEIGHTINCHES: 63 in
WEIGHTICAEL: 3632 [oz_av]

## 2017-12-16 MED ORDER — VANCOMYCIN HCL 10 G IV SOLR
1250.0000 mg | INTRAVENOUS | Status: DC
  Administered 2017-12-16 – 2017-12-18 (×3): 1250 mg via INTRAVENOUS
  Filled 2017-12-16 (×4): qty 1250

## 2017-12-16 MED ORDER — POLYETHYLENE GLYCOL 3350 17 G PO PACK
17.0000 g | PACK | Freq: Two times a day (BID) | ORAL | Status: DC
  Administered 2017-12-16 – 2017-12-18 (×2): 17 g via ORAL
  Filled 2017-12-16 (×11): qty 1

## 2017-12-16 MED ORDER — MORPHINE SULFATE (PF) 4 MG/ML IV SOLN
4.0000 mg | INTRAVENOUS | Status: DC | PRN
  Administered 2017-12-16 – 2017-12-17 (×6): 4 mg via INTRAVENOUS
  Filled 2017-12-16 (×6): qty 1

## 2017-12-16 MED ORDER — FUROSEMIDE 10 MG/ML IJ SOLN
60.0000 mg | Freq: Once | INTRAMUSCULAR | Status: AC
  Administered 2017-12-16: 60 mg via INTRAVENOUS
  Filled 2017-12-16: qty 6

## 2017-12-16 NOTE — Progress Notes (Addendum)
PROGRESS NOTE  Shelby Reese WJX:[id number] DOB: 05-08-1935 DOA: 12/12/2017 PCP: [name], MD   LOS: 4 days   Brief Narrative / Interim history: 83 y.o. female with medical history significant for PVD and persistent ulceration of right knee (medially) who underwent limb salvage intervention with debridement, skin graft therapy and wound VAC therapy most recently on 12/05/2017  who presents on 12/12/2017 after evaluation from physician at her SNF with worsening right knee pain and malodorous drainage was found to be septic secondary to the right knee wound.  Patient was previously evaluated in the ED on 12/09/17 for increased drainage with foul odor.  ED physicians did not visualize wound due to postoperative dressings, lab work and vitals at the time were stable and unremarkable so plans were made for patient to follow-up with her orthopedic surgeon on 1/24.  Dr. Sharol Given evaluated Shelby Reese on 1/24.  At that time patient stated she cannot tolerate continued compression therapy and current wound care.  Initial plan was for patient to undergo an AKA however patient then requested to cancel surgery as she was seeking a second opinion on wound care, and Dr. Oneida Alar was consulted and agreed to see patient in consultation. Patient is now postop day 1 after right above-knee amputation.  Assessment & Plan: Principal Problem:   Sepsis ([place]) Active Problems:   Iron deficiency anemia   CKD (chronic kidney disease), stage III (HCC)   Wound of right leg, sequela   Sepsis secondary to right knee wound infection -Patient status post multiple irrigation and debridements, and previous wound VAC (removed recently).  Low-grade fever, leukocytosis, and likely source meet criteria for sepsis. -vascular surgery consulted , now status post right above knee amputation on 1/28, by Dr. Oneida Alar -Continue currentvancomycin, Flagyl and cefepime, due to cellulitis of the left lower extremity, will obtain wound care  consultation, obtain ABI of the left lower extremity  blood culture no growth so far  Acute on chronic anemia, iron deficiency -Hemoglobin on admission 6.7, likely in the setting of chronic disease as well as chronic blood loss from her wound, she received 2 units of packed red blood cells , hemoglobin came up to 9.2, but now trending down, 8.0 this morning Follow CBC closely   Hypertension, relative hypotension, likely related to sepsis -Lactic acid within normal limits.  Status post 1 L bolus normal saline in the ED -Blood pressure is now normalized  CKD stage III, stable -Baseline creatinine 1.4-1.6.   -Currently creatinine 1.3, at baseline   Left lower extremity edema/cellulitis Suspect patient has underlying peripheral vascular disease, no history of lymphedema and venous insufficiency now with significant left lower extremity edema and blistering Will check ABI on the left lower extremity Will start patient on diuresis with IV Lasix to see if this swelling in the left lower extremity improves Also check 2-D echo to rule out congestive heart failure Continue empiric antibiotics as above  DVT prophylaxis: SCDs Code Status: Full code Family Communication: no family at bedside Disposition Plan: TBD  Consultants:   Vascular surgery, Dr. Oneida Alar  Procedures:   None   Antimicrobials:  Cefepime 1/25 >>  Vancomycin 1/25 >>   Subjective: Constipated and patient has not had a bowel movement, declined physical therapy today  Objective: Vitals:   12/15/17 1720 12/15/17 1831 12/15/17 2056 12/16/17 0631  BP: 139/61 (!) 138/46 (!) 143/45 (!) 139/52  Pulse: 86 89 87 73  Resp: 10 15 16 16   Temp:  98.3 F (36.8 C) 98.3 F (  36.8 C) 98.6 F (37 C)  TempSrc:  Oral Oral Oral  SpO2: 97% 98% 100% 100%  Weight:      Height:        Intake/Output Summary (Last 24 hours) at 12/16/2017 0944 Last data filed at 12/16/2017 0914 Gross per 24 hour  Intake 1230 ml  Output 120 ml    Net 1110 ml   Filed Weights   12/12/17 1442  Weight: 103 kg (227 lb)    Examination:  Constitutional: No distress Eyes: No scleral icterus, lids and conjunctivae normal ENMT: Moist mucous membranes Respiratory: Clear to auscultation, no wheezing, no crackles. Cardiovascular: Regular rate and rhythm, 3/6 SEM, no edema Abdomen: Soft, nontender, nondistended Musculoskeletal: Large blister on the left lower extremity shin,   Neurologic: No focal findings   Data Reviewed: I have independently reviewed following labs and imaging studies   CBC: Recent Labs  Lab 12/11/17 12/12/17 1527 12/13/17 0845 12/15/17 0716 12/15/17 1843 12/16/17 0548  WBC 13.3 14.5*  --  11.5* 13.2* 17.4*  NEUTROABS 10 11.0*  --   --   --   --   HGB 7.2* 6.7* 9.2* 9.4* 8.3* 8.0*  HCT 23* 21.9* 29.6* 30.9* 26.5* 26.5*  MCV  --  79.6  --  84.7 84.4 83.9  PLT 498* 459*  --  422* 397 174   Basic Metabolic Panel: Recent Labs  Lab 12/11/17  12/12/17 1527 12/13/17 0844 12/14/17 0612 12/15/17 0716 12/15/17 1843 12/16/17 0548  NA 133*  --  133* 132*  --  133*  --  131*  K 4.0  --  3.5 3.5  --  3.4*  --  4.0  CL  --   --  101 101  --  101  --  100*  CO2  --   --  24 19*  --  21*  --  21*  GLUCOSE  --   --  117* 91  --  107*  --  155*  BUN 16  --  18 16  --  20  --  17  CREATININE 1.4*   < > 1.38* 1.35* 1.31* 1.30* 1.19* 1.02*  CALCIUM  --   --  8.6* 8.4*  --  8.5*  --  8.1*   < > = values in this interval not displayed.   GFR: Estimated Creatinine Clearance: 50.4 mL/min (A) (by C-G formula based on SCr of 1.02 mg/dL (H)). Liver Function Tests: Recent Labs  Lab 12/12/17 1527 12/14/17 0612 12/15/17 0716  AST 18  --  18  ALT 13*  --  14  ALKPHOS 69  --  70  BILITOT 0.2*  --  0.6  PROT 6.3*  --  6.3*  ALBUMIN 2.5* 2.2* 2.4*   No results for input(s): LIPASE, AMYLASE in the last 168 hours. No results for input(s): AMMONIA in the last 168 hours. Coagulation Profile: Recent Labs  Lab  12/15/17 0716  INR 1.10   Cardiac Enzymes: No results for input(s): CKTOTAL, CKMB, CKMBINDEX, TROPONINI in the last 168 hours. BNP (last 3 results) No results for input(s): PROBNP in the last 8760 hours. HbA1C: No results for input(s): HGBA1C in the last 72 hours. CBG: Recent Labs  Lab 12/13/17 1153 12/13/17 1609  GLUCAP 103* 94   Lipid Profile: No results for input(s): CHOL, HDL, LDLCALC, TRIG, CHOLHDL, LDLDIRECT in the last 72 hours. Thyroid Function Tests: No results for input(s): TSH, T4TOTAL, FREET4, T3FREE, THYROIDAB in the last 72 hours. Anemia Panel: No results for  input(s): VITAMINB12, FOLATE, FERRITIN, TIBC, IRON, RETICCTPCT in the last 72 hours. Urine analysis:    Component Value Date/Time   COLORURINE AMBER (A) 12/12/2017 1719   APPEARANCEUR CLEAR 12/12/2017 1719   LABSPEC 1.020 12/12/2017 1719   PHURINE 5.0 12/12/2017 1719   GLUCOSEU NEGATIVE 12/12/2017 1719   HGBUR NEGATIVE 12/12/2017 1719   BILIRUBINUR NEGATIVE 12/12/2017 1719   KETONESUR NEGATIVE 12/12/2017 1719   PROTEINUR NEGATIVE 12/12/2017 1719   NITRITE POSITIVE (A) 12/12/2017 1719   LEUKOCYTESUR MODERATE (A) 12/12/2017 1719   Sepsis Labs: Invalid input(s): PROCALCITONIN, LACTICIDVEN  Recent Results (from the past 240 hour(s))  Culture, blood (routine x 2)     Status: None (Preliminary result)   Collection Time: 12/12/17  3:25 PM  Result Value Ref Range Status   Specimen Description BLOOD RIGHT FOREARM  Final   Special Requests IN PEDIATRIC BOTTLE Blood Culture adequate volume  Final   Culture   Final    NO GROWTH 4 DAYS Performed at [facility], [address] [place], [place] [address]    Report Status PENDING  Incomplete  Culture, blood (routine x 2)     Status: None (Preliminary result)   Collection Time: 12/12/17  3:26 PM  Result Value Ref Range Status   Specimen Description BLOOD LEFT FOREARM  Final   Special Requests   Final    BOTTLES DRAWN AEROBIC AND ANAEROBIC Blood  Culture adequate volume   Culture   Final    NO GROWTH 4 DAYS Performed at [facility], [place] [address] [place], [place] [address]    Report Status PENDING  Incomplete  Surgical PCR screen     Status: None   Collection Time: 12/15/17 10:15 AM  Result Value Ref Range Status   MRSA, PCR NEGATIVE NEGATIVE Final   Staphylococcus aureus NEGATIVE NEGATIVE Final    Comment: (NOTE) The Xpert SA Assay (FDA approved for NASAL specimens in patients 75 years of age and older), is one component of a comprehensive surveillance program. It is not intended to diagnose infection nor to guide or monitor treatment.       Radiology Studies: No results found.   Scheduled Meds: . docusate sodium  100 mg Oral Daily  . famotidine  40 mg Oral Q1500  . heparin  5,000 Units Subcutaneous Q8H  . levothyroxine  175 mcg Oral QAC breakfast  . multivitamin with minerals  1 tablet Oral Daily  . nutrition supplement (JUVEN)  1 packet Oral BID BM  . ondansetron (ZOFRAN) IV  4 mg Intravenous Once  . pantoprazole  40 mg Oral Daily  . polyethylene glycol  17 g Oral BID   Continuous Infusions: . ceFEPime (MAXIPIME) IV Stopped (12/16/17 0523)  . dextrose 5 % and 0.45% NaCl 75 mL/hr at 12/15/17 1800  . lactated ringers    . lactated ringers 10 mL/hr at 12/15/17 1425  . magnesium sulfate 1 - 4 g bolus IVPB    . metronidazole 500 mg (12/16/17 0903)     pager  [phone]  If 7PM-7AM, please contact night-coverage [email or web address] Password [facility] 12/16/2017, 9:44 AM

## 2017-12-16 NOTE — Care Management Important Message (Signed)
Important Message  Patient Details  Name: Shelby Reese MRN: [id number] Date of Birth: Feb 16, 1935   Medicare Important Message Given:  Yes    Hershel Corkery [address] 12/16/2017, 12:47 PM

## 2017-12-16 NOTE — Progress Notes (Signed)
  Echocardiogram 2D Echocardiogram has been performed.  Merrie Roof F 12/16/2017, 12:02 PM

## 2017-12-16 NOTE — Progress Notes (Signed)
OT / PT Cancellation Note  Patient Details Name: Shelby Reese MRN: [id number] DOB: [birth year]   Cancelled Treatment:    Reason Eval/Treat Not Completed: Patient declined, no reason specified Pt refusing at 9 AM this morning requesting therapy return in the afternoon. Discussion with patient for afternoon treatment established and patient requesting to reach a chair level. Pt states "I'll be in a wheelchair the rest of my life. I have to be able to do that." pt agreeable to basic transfer OOB upon return. A drop arm chair was delivered to the room as prep for afternoon treatment. Pt was educated twice prior to OT/PT arrival at 13:15PM of pending therapy.   Pt refusing therapy upon therapists entering room prior to therapist being able to re introduce their selves. Pt states "not today NOT TODAY!" OT explaining in depth reason for therapy and need to proceed to help with recovery. Pt explaining "not after what they have done to me today." OT asking patient to explain what events have occurred so that therapy could help resolve any positioning / transfer/ bed level care education needed with staff. Pt was unable to describe or provided detail. Pt only states "not after what they have done." Pt educated that she can only refuse therapy 3 times before therapy will not return with services. Pt states "that's fine. You can try again tomorrow" Pt avoiding eye contact and unwilling to reason with therapists on need for evaluation. 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 Pt reports no need to perform bed mobility due to purewick placement. Pt could benefit from purewick d/c to require pt to participate in bed level care with staff.  Pt states agreeable to SNF placement . Pt states "I have no choice"  Pt is noted at 1 out 3 refusal at this time with therapy. OT/PT will reattempt at the next appropriate date/ time  Vonita Moss   OTR/L Pager:  [phone] Office: [phone]  Rolinda Roan, PT, DPT Acute Rehabilitation Services Pager: [phone]  .  12/16/2017, 1:25 PM

## 2017-12-16 NOTE — Progress Notes (Addendum)
  Progress Note    12/16/2017 8:07 AM 1 Day Post-Op  Subjective:  Patient is complaining of pain to her R AKA site   Vitals:   12/15/17 2056 12/16/17 0631  BP: (!) 143/45 (!) 139/52  Pulse: 87 73  Resp: 16 16  Temp: 98.3 F (36.8 C) 98.6 F (37 C)  SpO2: 100% 100%    Physical Exam: Incisions:  Dressing left in place   CBC    Component Value Date/Time   WBC 17.4 (H) 12/16/2017 0548   RBC 3.16 (L) 12/16/2017 0548   HGB 8.0 (L) 12/16/2017 0548   HGB 11.9 04/15/2013 1516   HCT 26.5 (L) 12/16/2017 0548   HCT 38.7 04/15/2013 1516   PLT 381 12/16/2017 0548   PLT 289 04/15/2013 1516   MCV 83.9 12/16/2017 0548   MCV 88.0 12/25/2014 1540   MCV 89.6 04/15/2013 1516   MCH 25.3 (L) 12/16/2017 0548   MCHC 30.2 12/16/2017 0548   RDW 17.0 (H) 12/16/2017 0548   RDW 15.1 (H) 04/15/2013 1516   LYMPHSABS 1.5 12/12/2017 1527   LYMPHSABS 2.4 04/15/2013 1516   MONOABS 1.6 (H) 12/12/2017 1527   MONOABS 0.6 04/15/2013 1516   EOSABS 0.4 12/12/2017 1527   EOSABS 0.5 04/15/2013 1516   BASOSABS 0.0 12/12/2017 1527   BASOSABS 0.0 04/15/2013 1516    BMET    Component Value Date/Time   NA 131 (L) 12/16/2017 0548   NA 133 (A) 12/11/2017   NA 140 04/15/2013 1516   K 4.0 12/16/2017 0548   K 4.3 04/15/2013 1516   CL 100 (L) 12/16/2017 0548   CL 105 04/15/2013 1516   CO2 21 (L) 12/16/2017 0548   CO2 25 04/15/2013 1516   GLUCOSE 155 (H) 12/16/2017 0548   GLUCOSE 94 04/15/2013 1516   BUN 17 12/16/2017 0548   BUN 16 12/11/2017   BUN 28.2 (H) 04/15/2013 1516   CREATININE 1.02 (H) 12/16/2017 0548   CREATININE 1.21 (H) 12/25/2014 1530   CREATININE 1.4 (H) 04/15/2013 1516   CALCIUM 8.1 (L) 12/16/2017 0548   CALCIUM 9.9 04/15/2013 1516   GFRNONAA 51 (L) 12/16/2017 0548   GFRNONAA 43 (L) 12/25/2014 1530   GFRAA 59 (L) 12/16/2017 0548   GFRAA 50 (L) 12/25/2014 1530    INR    Component Value Date/Time   INR 1.10 12/15/2017 0716     Intake/Output Summary (Last 24 hours) at  12/16/2017 0807 Last data filed at 12/15/2017 1700 Gross per 24 hour  Intake 990 ml  Output 120 ml  Net 870 ml     Assessment/Plan:  83 y.o. female is s/p right above knee amputation  1 Day Post-Op  - Scheduled pain control today - Dressing down tomorrow - PT/OO to eval and treat today - PO Hgb 8; Recheck CBC tomorrow am   Dagoberto Ligas, PA-C [facility] [phone] 12/16/2017 8:07 AM   AGree with above Check AKA tomorrow if looks good and pain controlled ok for d/c tomorrow from my standpoint  Ruta Hinds, MD [facility] [place]: [phone] Pager: [phone]

## 2017-12-16 NOTE — Progress Notes (Signed)
PT Cancellation Note  Patient Details Name: Shelby Reese MRN: [id number] DOB: [birth year]   Cancelled Treatment:    Reason Eval/Treat Not Completed: Patient declined, no reason specified. Provided max encouragement to participate and pt adamant that she will not be doing any therapy this morning. Agreeable to attempt OOB this afternoon. Will check back as schedule allows to initiate PT evaluation.   Thelma Comp 12/16/2017, 9:24 AM   Rolinda Roan, PT, DPT Acute Rehabilitation Services Pager: [phone]

## 2017-12-17 ENCOUNTER — Ambulatory Visit (INDEPENDENT_AMBULATORY_CARE_PROVIDER_SITE_OTHER): Payer: Medicare Other | Admitting: Orthopedic Surgery

## 2017-12-17 ENCOUNTER — Inpatient Hospital Stay (HOSPITAL_COMMUNITY): Payer: Medicare Other

## 2017-12-17 DIAGNOSIS — L0390 Cellulitis, unspecified: Secondary | ICD-10-CM

## 2017-12-17 DIAGNOSIS — L03116 Cellulitis of left lower limb: Secondary | ICD-10-CM

## 2017-12-17 LAB — COMPREHENSIVE METABOLIC PANEL
ALT: 13 U/L — ABNORMAL LOW (ref 14–54)
ANION GAP: 10 (ref 5–15)
AST: 21 U/L (ref 15–41)
Albumin: 2 g/dL — ABNORMAL LOW (ref 3.5–5.0)
Alkaline Phosphatase: 65 U/L (ref 38–126)
BUN: 17 mg/dL (ref 6–20)
CHLORIDE: 99 mmol/L — AB (ref 101–111)
CO2: 21 mmol/L — AB (ref 22–32)
Calcium: 8.3 mg/dL — ABNORMAL LOW (ref 8.9–10.3)
Creatinine, Ser: 1.04 mg/dL — ABNORMAL HIGH (ref 0.44–1.00)
GFR calc non Af Amer: 49 mL/min — ABNORMAL LOW (ref >60)
GFR, EST AFRICAN AMERICAN: 57 mL/min — AB (ref >60)
Glucose, Bld: 97 mg/dL (ref 65–99)
POTASSIUM: 4 mmol/L (ref 3.5–5.1)
SODIUM: 130 mmol/L — AB (ref 135–145)
Total Bilirubin: 0.7 mg/dL (ref 0.3–1.2)
Total Protein: 5.7 g/dL — ABNORMAL LOW (ref 6.5–8.1)

## 2017-12-17 LAB — CULTURE, BLOOD (ROUTINE X 2)
CULTURE: NO GROWTH
Culture: NO GROWTH
SPECIAL REQUESTS: ADEQUATE
SPECIAL REQUESTS: ADEQUATE

## 2017-12-17 LAB — CBC
HCT: 25.4 % — ABNORMAL LOW (ref 36.0–46.0)
Hemoglobin: 7.9 g/dL — ABNORMAL LOW (ref 12.0–15.0)
MCH: 26.3 pg (ref 26.0–34.0)
MCHC: 31.1 g/dL (ref 30.0–36.0)
MCV: 84.7 fL (ref 78.0–100.0)
PLATELETS: 391 10*3/uL (ref 150–400)
RBC: 3 MIL/uL — ABNORMAL LOW (ref 3.87–5.11)
RDW: 17.4 % — AB (ref 11.5–15.5)
WBC: 13.9 10*3/uL — ABNORMAL HIGH (ref 4.0–10.5)

## 2017-12-17 MED ORDER — OXYCODONE-ACETAMINOPHEN 5-325 MG PO TABS
1.0000 | ORAL_TABLET | Freq: Four times a day (QID) | ORAL | Status: DC | PRN
  Administered 2017-12-17 – 2017-12-23 (×15): 1 via ORAL
  Filled 2017-12-17 (×15): qty 1

## 2017-12-17 MED ORDER — SENNA 8.6 MG PO TABS
2.0000 | ORAL_TABLET | Freq: Every day | ORAL | Status: DC
  Administered 2017-12-18 – 2017-12-23 (×5): 17.2 mg via ORAL
  Filled 2017-12-17 (×6): qty 2

## 2017-12-17 MED ORDER — FUROSEMIDE 10 MG/ML IJ SOLN
40.0000 mg | Freq: Once | INTRAMUSCULAR | Status: AC
  Administered 2017-12-17: 40 mg via INTRAVENOUS
  Filled 2017-12-17: qty 4

## 2017-12-17 MED ORDER — ENSURE ENLIVE PO LIQD
237.0000 mL | Freq: Three times a day (TID) | ORAL | Status: DC
  Administered 2017-12-17 – 2017-12-22 (×10): 237 mL via ORAL

## 2017-12-17 MED ORDER — BISACODYL 10 MG RE SUPP: 10.0000 mg | Freq: Every day | RECTAL | Status: DC | PRN

## 2017-12-17 MED ORDER — MORPHINE SULFATE (PF) 2 MG/ML IV SOLN
2.0000 mg | INTRAVENOUS | Status: DC | PRN
  Administered 2017-12-17 – 2017-12-19 (×7): 2 mg via INTRAVENOUS
  Filled 2017-12-17 (×7): qty 1

## 2017-12-17 NOTE — Progress Notes (Signed)
Orthopedic Tech Progress Note Patient Details:  Shelby Reese Dec 06, 1934 [id number]  Patient ID: Edwin Dada, female   DOB: 09-23-1935, 83 y.o.   MRN: [id number]   Hildred Priest 12/17/2017, 9:44 AM Called in bio-tech brace order; spoke with [facility]

## 2017-12-17 NOTE — Progress Notes (Signed)
PROGRESS NOTE  Shelby Reese EVO:[id number] DOB: 1935-09-14 DOA: 12/12/2017 PCP: [name], MD   LOS: 5 days   Brief Narrative / Interim history: 83 y.o. female with medical history significant for PVD and persistent ulceration of right knee (medially) who underwent limb salvage intervention with debridement, skin graft therapy and wound VAC therapy most recently on 12/05/2017  who presented on 12/12/2017 after evaluation from physician at her SNF with worsening right knee pain and malodorous drainage and was found to be septic secondary to the right knee wound.  Patient was previously evaluated in the ED on 12/09/17 for increased drainage with foul odor.  ED physicians did not visualize wound due to postoperative dressings, lab work and vitals at the time were stable and unremarkable so plans were made for patient to follow-up with her orthopedic surgeon on 1/24.  Dr. Sharol Given evaluated Shelby Reese on 1/24.  At that time patient stated she cannot tolerate continued compression therapy and current wound care.  Initial plan was for patient to undergo an AKA however patient then requested to cancel surgery as she was seeking a second opinion on wound care, and Dr. Oneida Alar was consulted and agreed to see patient in consultation. Patient is now postop s/p right above-knee amputation.  Assessment & Plan: Principal Problem:   Sepsis ([place]) Active Problems:   Iron deficiency anemia   CKD (chronic kidney disease), stage III (HCC)   Wound of right leg, sequela   Sepsis secondary to right knee wound infection -Patient status post multiple irrigation and debridements, and previous wound VAC (removed recently).  Low-grade fever, leukocytosis, and likely source meet criteria for sepsis. - Vascular surgery consulted , now status post right above knee amputation on 1/28, by Dr. Oneida Alar - Continue current vancomycin, Flagyl and cefepime, due to cellulitis of the left lower extremity,  - WOC consultation for wound on  the dorsum of the left foot with blister. - Blood cultures x2: Negative to date, final report.  MRSA PCR negative. -Has been on IV antibiotics since 1/25.  Suspect some of her findings in the left leg due to venous stasis.  Elevate extremity. - VVS to advise if antibiotics needed for postsurgical indication.  Also if any concern regarding PAD and left lower extremity.  Acute on chronic anemia, iron deficiency -Hemoglobin on admission 6.7, likely in the setting of chronic disease as well as chronic blood loss from her wound, she received 2 units of packed red blood cells , hemoglobin came up to 9.2, but now trending down, 7.9 on 1/30. -Follow CBC in a.m. and transfuse if less than 7 g per DL.  Hypertension, relative hypotension, likely related to sepsis -Lactic acid within normal limits.  Status post 1 L bolus normal saline in the ED -Blood pressure is now normalized  CKD stage III, stable -Baseline creatinine 1.4-1.6.   -Creatinine has normalized.  Follow BMP periodically.  Left lower extremity edema/cellulitis Suspect patient has underlying peripheral vascular disease, no history of lymphedema and venous insufficiency now with significant left lower extremity edema and blistering Will check ABI on the left lower extremity Started patient on diuresis with IV Lasix to see if this swelling in the left lower extremity improves Also check 2-D echo to rule out congestive heart failure: Normal EF and grade 2 diastolic dysfunction. Continue empiric antibiotics as above .?  Improving.  Reassess in a.m.  DVT prophylaxis: SCDs Code Status: Full code Family Communication: no family at bedside Disposition Plan: DC to SNF pending clinical  improvement.  Consultants:   Vascular surgery, Dr. Oneida Alar  Procedures:   None   Antimicrobials:  Cefepime 1/25 >>  Vancomycin 1/25 >>  IV Flagyl 1/25 >.  Subjective: Patient appears slightly confused.  Not happy with food and states that she has  been given "artificial food, artificial meat and artificial butter".  Reports pain in her right AKA stump and left lower extremity between 6-7.  Objective: Vitals:   12/16/17 1500 12/16/17 2003 12/17/17 0357 12/17/17 1311  BP: (!) 129/51 (!) 129/50 (!) 133/53 (!) 136/49  Pulse: 85 90 88 85  Resp: 16 18 18 17   Temp: 98.2 F (36.8 C) 99.6 F (37.6 C) 99.1 F (37.3 C)   TempSrc: Oral Oral Oral   SpO2: 96% 95% 97% 98%  Weight:      Height:        Intake/Output Summary (Last 24 hours) at 12/17/2017 1609 Last data filed at 12/17/2017 1245 Gross per 24 hour  Intake 596 ml  Output 1150 ml  Net -554 ml   Filed Weights   12/12/17 1442  Weight: 103 kg (227 lb)    Examination:  Constitutional: Elderly female, moderately built and obese, sitting up in bed comfortably eating crackers. Respiratory: Slightly diminished breath sounds in the bases but otherwise clear to auscultation without wheezing, rhonchi or crackles.  No increased work of breathing.  Cardiovascular: S1 and S2 heard, RRR.  No JVD or murmurs.  Left lower extremity 1+ pitting edema.  Telemetry: Sinus rhythm. Abdomen: Soft, nontender, nondistended.  Normal bowel sounds heard. Musculoskeletal: Large blister on the left lower extremity shin.  Patchy erythema without increased warmth but some tenderness in left leg associated with edema.  Skin appears chronic indurated. Neurologic: Alert and oriented x2.  No focal neurological deficits.   Data Reviewed: I have independently reviewed following labs and imaging studies   CBC: Recent Labs  Lab 12/11/17 12/12/17 1527 12/13/17 0845 12/15/17 0716 12/15/17 1843 12/16/17 0548 12/17/17 0533  WBC 13.3 14.5*  --  11.5* 13.2* 17.4* 13.9*  NEUTROABS 10 11.0*  --   --   --   --   --   HGB 7.2* 6.7* 9.2* 9.4* 8.3* 8.0* 7.9*  HCT 23* 21.9* 29.6* 30.9* 26.5* 26.5* 25.4*  MCV  --  79.6  --  84.7 84.4 83.9 84.7  PLT 498* 459*  --  422* 397 381 109   Basic Metabolic Panel: Recent  Labs  Lab 12/12/17 1527 12/13/17 0844 12/14/17 0612 12/15/17 0716 12/15/17 1843 12/16/17 0548 12/17/17 0533  NA 133* 132*  --  133*  --  131* 130*  K 3.5 3.5  --  3.4*  --  4.0 4.0  CL 101 101  --  101  --  100* 99*  CO2 24 19*  --  21*  --  21* 21*  GLUCOSE 117* 91  --  107*  --  155* 97  BUN 18 16  --  20  --  17 17  CREATININE 1.38* 1.35* 1.31* 1.30* 1.19* 1.02* 1.04*  CALCIUM 8.6* 8.4*  --  8.5*  --  8.1* 8.3*   GFR: Estimated Creatinine Clearance: 49.4 mL/min (A) (by C-G formula based on SCr of 1.04 mg/dL (H)). Liver Function Tests: Recent Labs  Lab 12/12/17 1527 12/14/17 0612 12/15/17 0716 12/17/17 0533  AST 18  --  18 21  ALT 13*  --  14 13*  ALKPHOS 69  --  70 65  BILITOT 0.2*  --  0.6 0.7  PROT 6.3*  --  6.3* 5.7*  ALBUMIN 2.5* 2.2* 2.4* 2.0*   Coagulation Profile: Recent Labs  Lab 12/15/17 0716  INR 1.10   BNP (last 3 results) No results for input(s): PROBNP in the last 8760 hours.  CBG: Recent Labs  Lab 12/13/17 1153 12/13/17 1609  GLUCAP 103* 94   Urine analysis:    Component Value Date/Time   COLORURINE AMBER (A) 12/12/2017 1719   APPEARANCEUR CLEAR 12/12/2017 1719   LABSPEC 1.020 12/12/2017 1719   PHURINE 5.0 12/12/2017 1719   GLUCOSEU NEGATIVE 12/12/2017 1719   HGBUR NEGATIVE 12/12/2017 1719   BILIRUBINUR NEGATIVE 12/12/2017 1719   KETONESUR NEGATIVE 12/12/2017 1719   PROTEINUR NEGATIVE 12/12/2017 1719   NITRITE POSITIVE (A) 12/12/2017 1719   LEUKOCYTESUR MODERATE (A) 12/12/2017 1719     Recent Results (from the past 240 hour(s))  Culture, blood (routine x 2)     Status: None   Collection Time: 12/12/17  3:25 PM  Result Value Ref Range Status   Specimen Description BLOOD RIGHT FOREARM  Final   Special Requests IN PEDIATRIC BOTTLE Blood Culture adequate volume  Final   Culture   Final    NO GROWTH 5 DAYS Performed at [facility], [place] [address] [place], [place] [address]    Report Status 12/17/2017 FINAL  Final    Culture, blood (routine x 2)     Status: None   Collection Time: 12/12/17  3:26 PM  Result Value Ref Range Status   Specimen Description BLOOD LEFT FOREARM  Final   Special Requests   Final    BOTTLES DRAWN AEROBIC AND ANAEROBIC Blood Culture adequate volume   Culture   Final    NO GROWTH 5 DAYS Performed at [facility], [place] [address], [place], [place] [address]    Report Status 12/17/2017 FINAL  Final  Surgical PCR screen     Status: None   Collection Time: 12/15/17 10:15 AM  Result Value Ref Range Status   MRSA, PCR NEGATIVE NEGATIVE Final   Staphylococcus aureus NEGATIVE NEGATIVE Final    Comment: (NOTE) The Xpert SA Assay (FDA approved for NASAL specimens in patients 47 years of age and older), is one component of a comprehensive surveillance program. It is not intended to diagnose infection nor to guide or monitor treatment.       Radiology Studies: No results found.   Scheduled Meds: . docusate sodium  100 mg Oral Daily  . famotidine  40 mg Oral Q1500  . heparin  5,000 Units Subcutaneous Q8H  . levothyroxine  175 mcg Oral QAC breakfast  . multivitamin with minerals  1 tablet Oral Daily  . nutrition supplement (JUVEN)  1 packet Oral BID BM  . ondansetron (ZOFRAN) IV  4 mg Intravenous Once  . pantoprazole  40 mg Oral Daily  . polyethylene glycol  17 g Oral BID  . senna  2 tablet Oral Daily   Continuous Infusions: . ceFEPime (MAXIPIME) IV Stopped (12/17/17 0140)  . lactated ringers    . lactated ringers 10 mL/hr at 12/15/17 1425  . magnesium sulfate 1 - 4 g bolus IVPB    . metronidazole 500 mg (12/17/17 1114)  . vancomycin 1,250 mg (12/17/17 1114)    Vernell Leep, MD, FACP, [facility]. [facility] Pager [phone]  If 7PM-7AM, please contact night-coverage [email or web address] Password [email or web address] 12/17/2017, 4:20 PM

## 2017-12-17 NOTE — Progress Notes (Signed)
Pharmacy Antibiotic Note  Shelby Reese is a 83 y.o. female now s/p R AKA on 1/28. Pharmacy has been consulted for vancomycin and cefepime dosing for wound infection. Also on Flagyl per MD.  Patient's renal function is improving slowly. Afebrile, WBC down to 13.9.   Plan: Vancomycin 1250mg  IV Q24H Cefepime 2gm IV Q24H Flagyl 500mg  IV Q8H per MD Monitor renal fxn, clinical progress, vanc trough as indicated F/U abx LOT post right AKA on 12/15/17 - may d/c soon per Surgery note   Height: 5\' 3"  (160 cm) Weight: 227 lb (103 kg) IBW/kg (Calculated) : 52.4  Temp (24hrs), Avg:99 F (37.2 C), Min:98.2 F (36.8 C), Max:99.6 F (37.6 C)  Recent Labs  Lab 12/12/17 1527 12/12/17 1531  12/14/17 0612 12/15/17 0716 12/15/17 1843 12/16/17 0548 12/17/17 0533  WBC 14.5*  --   --   --  11.5* 13.2* 17.4* 13.9*  CREATININE 1.38*  --    < > 1.31* 1.30* 1.19* 1.02* 1.04*  LATICACIDVEN  --  0.61  --   --   --   --   --   --    < > = values in this interval not displayed.    Estimated Creatinine Clearance: 49.4 mL/min (A) (by C-G formula based on SCr of 1.04 mg/dL (H)).    Allergies  Allergen Reactions  . Cinnamon Hives  . Ciprofloxacin Other (See Comments)    TREMORS  . Diovan [Valsartan] Other (See Comments)    Extreme vertigo  . Food Diarrhea and Other (See Comments)    ORANGE JUICE   UPSET STOMACH  . Latex Rash and Other (See Comments)    Rash/inflammation due to exposure  . Nitrofuran Derivatives Hives and Rash    "Full body rash"  . Penicillins Hives and Swelling    *tolerated Ceftriaxone September 2018 Has patient had a PCN reaction causing immediate rash, facial/tongue/throat swelling, SOB or lightheadedness with hypotension:No--severe irritation at the injection site Has patient had a PCN reaction causing severe rash involving mucus membranes or skin necrosis:Unknown Has patient had a PCN reaction that required hospitalization:No Has patient had a PCN reaction occurring  within the last 10 years:Yes If all of the above answers are "NO", then may proceed with  . Bactrim [Sulfamethoxazole-Trimethoprim] Diarrhea and Nausea Only  . Other Rash    Mycins, Strawberry, Oranges  . Sulfa Antibiotics Diarrhea and Nausea Only    Vanc 1/25 >> Cefepime 1/25 >> Flagyl 1/25 >>  1/25 BCx - NGTD   [name], PharmD, BCPS Clinical Pharmacist Clinical phone for 12/17/2017 until 3:30pm: [phone] If after 3:30pm, please call main pharmacy at: [phone] 12/17/2017 10:43 AM

## 2017-12-17 NOTE — Evaluation (Signed)
Physical Therapy Evaluation Patient Details Name: Shelby Reese MRN: [id number] DOB: [birth year] Today's Date: 12/17/2017   History of Present Illness  Pt is an 83 y/o female who presents s/p several attempts of salvaging RLE due to gangrene. 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 PMH significant for anemia, CKD, fibromyalgia, lymphedema, PVD, venous insufficiency, hx multiple I&D's with VAC placement.   Clinical Impression  Pt admitted with above diagnosis. Pt currently with functional limitations due to the deficits listed below (see PT Problem List). At the time of PT eval pt limited by pain and anxiety with mobility. Pt agreeable initially to EOB and then eventually agreeable to transition to recliner chair with Shelby Reese. Overall tolerated well with no reports of increased pain in RLE. Feel that pt would benefit from continued rehab at the SNF level prior to return home with husband. Acutely, pt will benefit from skilled PT to increase their independence and safety with mobility to allow discharge to the venue listed below.       Follow Up Recommendations SNF;Supervision/Assistance - 24 hour    Equipment Recommendations  Wheelchair (measurements PT);Wheelchair cushion (measurements PT)    Recommendations for Other Services       Precautions / Restrictions Precautions Precautions: Fall Restrictions Weight Bearing Restrictions: Yes RLE Weight Bearing: Non weight bearing      Mobility  Bed Mobility Overal bed mobility: Needs Assistance Bed Mobility: Supine to Sit     Supine to sit: Mod assist;+2 for physical assistance;HOB elevated     General bed mobility comments: Assist for LE advancement towards EOB and for trunk elevation to full sitting position. Bed pad used for scooting assist.   Transfers                 General transfer comment: Pt tolerated transfer OOB to chair with Maximove. Sling donned in sitting and left in chair behind patient for ease of back to bed with  nursing staff.   Ambulation/Gait                Stairs            Wheelchair Mobility    Modified Rankin (Stroke Patients Only)       Balance Overall balance assessment: Needs assistance Sitting-balance support: Feet supported;Single extremity supported Sitting balance-Leahy Scale: Fair   Postural control: Posterior lean                                   Pertinent Vitals/Pain Pain Assessment: Faces Faces Pain Scale: Hurts little more Pain Location: RLE incision site - fearful of pain and ancticipation of pain. Pt appears to recover quickly from any painful moments throughout session however. Pain Descriptors / Indicators: Operative site guarding;Aching;Grimacing Pain Intervention(s): Monitored during session;Premedicated before session;Patient requesting pain meds-RN notified    Home Living Family/patient expects to be discharged to:: Skilled nursing facility                      Prior Function Level of Independence: Needs assistance   Gait / Transfers Assistance Needed: Using a walker prior to surgery and requiring up to mod assist for transfers.     Comments: Retired Therapist, sports.     Hand Dominance   Dominant Hand: Right    Extremity/Trunk Assessment   Upper Extremity Assessment Upper Extremity Assessment: Defer to OT evaluation    Lower Extremity Assessment Lower Extremity Assessment: RLE  deficits/detail;LLE deficits/detail RLE Deficits / Details: AKA LLE Deficits / Details: Noted area on L heel that appeared to be a pressure injury, and several weeping areas over the dorsum of the foot. One large blistered area on lateral dorsum of foot. Heel foam dressing applied.     Cervical / Trunk Assessment Cervical / Trunk Assessment: Kyphotic  Communication   Communication: No difficulties  Cognition Arousal/Alertness: Awake/alert Behavior During Therapy: Flat affect Overall Cognitive Status: No family/caregiver present to determine  baseline cognitive functioning                                 General Comments: Firm with her preferences but was eventually more pleasant and trusting of therapists      General Comments      Exercises     Assessment/Plan    PT Assessment Patient needs continued PT services  PT Problem List Decreased strength;Decreased range of motion;Decreased activity tolerance;Decreased balance;Decreased mobility;Decreased knowledge of use of DME;Decreased safety awareness;Decreased knowledge of precautions;Pain       PT Treatment Interventions DME instruction;Functional mobility training;Therapeutic activities;Therapeutic exercise;Neuromuscular re-education;Patient/family education;Wheelchair mobility training    PT Goals (Current goals can be found in the Care Plan section)  Acute Rehab PT Goals Patient Stated Goal: Decrease pain PT Goal Formulation: With patient Time For Goal Achievement: 12/31/17 Potential to Achieve Goals: Good    Frequency Min 2X/week   Barriers to discharge        Co-evaluation PT/OT/SLP Co-Evaluation/Treatment: Yes Reason for Co-Treatment: Necessary to address cognition/behavior during functional activity;For patient/therapist safety;To address functional/ADL transfers PT goals addressed during session: Mobility/safety with mobility;Balance         AM-PAC PT "6 Clicks" Daily Activity  Outcome Measure Difficulty turning over in bed (including adjusting bedclothes, sheets and blankets)?: Unable Difficulty moving from lying on back to sitting on the side of the bed? : Unable Difficulty sitting down on and standing up from a chair with arms (e.g., wheelchair, bedside commode, etc,.)?: Unable Help needed moving to and from a bed to chair (including a wheelchair)?: Total Help needed walking in hospital room?: Total Help needed climbing 3-5 steps with a railing? : Total 6 Click Score: 6    End of Session Equipment Utilized During Treatment:  Gait belt Activity Tolerance: Patient tolerated treatment well Patient left: in chair;with call bell/phone within reach Nurse Communication: Mobility status PT Visit Diagnosis: Pain;Difficulty in walking, not elsewhere classified (R26.2) Pain - Right/Left: Right Pain - part of body: Leg    Time: 1136-1229 PT Time Calculation (min) (ACUTE ONLY): 53 min   Charges:     PT Treatments $Therapeutic Activity: 23-37 mins   PT G Codes:        Rolinda Roan, PT, DPT Acute Rehabilitation Services Pager: [phone]   Thelma Comp 12/17/2017, 1:45 PM

## 2017-12-17 NOTE — Progress Notes (Signed)
ABI's have been completed. Right Amputation  Left Unable to obtain ABI's due to lower extremity wounds, and patient pain tolerance.   12/17/17 4:14 PM Shelby Reese RVT

## 2017-12-17 NOTE — Evaluation (Signed)
Occupational Therapy Evaluation Patient Details Name: Shelby Reese MRN: [id number] DOB: May 06, 1935 Today's Date: 12/17/2017    History of Present Illness Pt is an 83 y/o female who presents s/p several attempts of salvaging RLE due to gangrene. 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 PMH significant for anemia, CKD, fibromyalgia, lymphedema, PVD, venous insufficiency, hx multiple I&D's with VAC placement.    Clinical Impression   Patient is s/p R AKA surgery resulting in functional limitations due to the deficits listed below (see OT problem list). Pt requires extensive (A) for adls at this time and hoyer lift for transfer. Pt with edema and noted redness to skin in supine. Recommend frequent rotations in the bed to prevent break down. Recommend L LE pravalon boot. Patient will benefit from skilled OT acutely to increase independence and safety with ADLS to allow discharge SNF.     Follow Up Recommendations  SNF    Equipment Recommendations  Hospital bed;Wheelchair cushion (measurements OT);Wheelchair (measurements OT)    Recommendations for Other Services       Precautions / Restrictions Precautions Precautions: Fall Precaution Comments: wounds to L LE Restrictions Weight Bearing Restrictions: Yes RLE Weight Bearing: Non weight bearing      Mobility Bed Mobility Overal bed mobility: Needs Assistance Bed Mobility: Supine to Sit     Supine to sit: Mod assist;+2 for physical assistance;HOB elevated     General bed mobility comments: Assist for LE advancement towards EOB and for trunk elevation to full sitting position. Bed pad used for scooting assist.   Transfers Overall transfer level: Needs assistance               General transfer comment: Pt tolerated transfer OOB to chair with Maximove. Sling donned in sitting and left in chair behind patient for ease of back to bed with nursing staff.     Balance Overall balance assessment: Needs assistance Sitting-balance  support: Feet supported;Single extremity supported Sitting balance-Leahy Scale: Fair   Postural control: Posterior lean                                 ADL either performed or assessed with clinical judgement   ADL Overall ADL's : Needs assistance/impaired Eating/Feeding: Set up   Grooming: Brushing hair;Minimal assistance;Sitting Grooming Details (indicate cue type and reason): able to sit EOB and comb hair with two hands static sitting     Lower Body Bathing: Total assistance       Lower Body Dressing: Total assistance                 General ADL Comments: pt completing bath with tech on arrival and agreeable to groomign eob and then hoyer lif t to chair. RECOMMEND Pravoln boot for L LE due to wounds     Vision Baseline Vision/History: Wears glasses Wears Glasses: At all times       Perception     Praxis      Pertinent Vitals/Pain Pain Assessment: Faces Faces Pain Scale: Hurts little more Pain Location: RLE incision site - fearful of pain and ancticipation of pain. Pt appears to recover quickly from any painful moments throughout session however. Pain Descriptors / Indicators: Operative site guarding;Aching;Grimacing Pain Intervention(s): Monitored during session;Premedicated before session;Repositioned     Hand Dominance Right   Extremity/Trunk Assessment Upper Extremity Assessment Upper Extremity Assessment: Generalized weakness   Lower Extremity Assessment Lower Extremity Assessment: Defer to PT evaluation RLE  Deficits / Details: AKA LLE Deficits / Details: Noted area on L heel that appeared to be a pressure injury, and several weeping areas over the dorsum of the foot. One large blistered area on lateral dorsum of foot. Heel foam dressing applied.    Cervical / Trunk Assessment Cervical / Trunk Assessment: Kyphotic   Communication Communication Communication: No difficulties   Cognition Arousal/Alertness: Awake/alert Behavior  During Therapy: Flat affect Overall Cognitive Status: No family/caregiver present to determine baseline cognitive functioning                                 General Comments: Firm with her preferences but was eventually more pleasant and trusting of therapists   General Comments  wound and drainage from L LE. pt reports pain with any contact that is not the foot    Exercises     Shoulder Instructions      Home Living Family/patient expects to be discharged to:: Skilled nursing facility                                        Prior Functioning/Environment Level of Independence: Needs assistance  Gait / Transfers Assistance Needed: Using a walker prior to surgery and requiring up to mod assist for transfers. ADL's / Homemaking Assistance Needed: requires (A) for all LB   Comments: Retired Therapist, sports.        OT Problem List: Decreased strength;Decreased activity tolerance;Impaired balance (sitting and/or standing);Decreased cognition;Decreased safety awareness;Decreased knowledge of use of DME or AE;Decreased knowledge of precautions;Obesity;Pain;Increased edema      OT Treatment/Interventions: Self-care/ADL training;Therapeutic exercise;DME and/or AE instruction;Therapeutic activities;Patient/family education;Balance training    OT Goals(Current goals can be found in the care plan section) Acute Rehab OT Goals Patient Stated Goal: Decrease pain OT Goal Formulation: With patient Time For Goal Achievement: 12/31/17 Potential to Achieve Goals: Good  OT Frequency: Min 2X/week   Barriers to D/C:            Co-evaluation PT/OT/SLP Co-Evaluation/Treatment: Yes Reason for Co-Treatment: Necessary to address cognition/behavior during functional activity;Complexity of the patient's impairments (multi-system involvement);For patient/therapist safety;To address functional/ADL transfers PT goals addressed during session: Mobility/safety with mobility;Balance OT  goals addressed during session: ADL's and self-care;Proper use of Adaptive equipment and DME;Strengthening/ROM      AM-PAC PT "6 Clicks" Daily Activity     Outcome Measure Help from another person eating meals?: None Help from another person taking care of personal grooming?: A Little Help from another person toileting, which includes using toliet, bedpan, or urinal?: Total Help from another person bathing (including washing, rinsing, drying)?: A Lot Help from another person to put on and taking off regular upper body clothing?: A Lot Help from another person to put on and taking off regular lower body clothing?: Total 6 Click Score: 13   End of Session Nurse Communication: Mobility status;Precautions;Weight bearing status;Need for lift equipment  Activity Tolerance: Patient tolerated treatment well Patient left: in chair;with call bell/phone within reach;Other (comment)(Tech/ Rn informed 2 hours up max in chair)  OT Visit Diagnosis: Pain Pain - Right/Left: Right Pain - part of body: Leg                Time: 9528-4132 OT Time Calculation (min): 37 min Charges:  OT General Charges $OT Visit: 1 Visit OT Evaluation $OT Eval High Complexity:  1 High G-Codes:      Jeri Modena   OTR/L Pager: [phone] Office: [phone] .   Parke Poisson B 12/17/2017, 4:39 PM

## 2017-12-17 NOTE — Progress Notes (Addendum)
[facility] [place]  Subjective  - still hurts   Objective (!) 136/49 85 99.1 F (37.3 C) (Oral) 17 98%  Intake/Output Summary (Last 24 hours) at 12/17/2017 1624 Last data filed at 12/17/2017 1245 Gross per 24 hour  Intake 596 ml  Output 1150 ml  Net -554 ml   Blister left foot cellulitic to knee, no pedal pulses Right AKA clean healing so far  Assessment/Planning: 1. Right AKA.  Continue dry dressing 2. Left foot.  Unable to tolerated cuff for ABI.  Will order left leg arterial duplex. Extremely high risk for left leg limb loss 3. Severe protein calorie malnutrition Albumin has actually decreased during hospitalization.  This does not bode well for wound healing. Encourage PO intake and ensure. 4.  Hyponatremia per primary team  Shelby Reese 12/17/2017 4:24 PM --  Laboratory Lab Results: Recent Labs    12/16/17 0548 12/17/17 0533  WBC 17.4* 13.9*  HGB 8.0* 7.9*  HCT 26.5* 25.4*  PLT 381 391   BMET Recent Labs    12/16/17 0548 12/17/17 0533  NA 131* 130*  K 4.0 4.0  CL 100* 99*  CO2 21* 21*  GLUCOSE 155* 97  BUN 17 17  CREATININE 1.02* 1.04*  CALCIUM 8.1* 8.3*    COAG Lab Results  Component Value Date   INR 1.10 12/15/2017   INR 1.07 11/12/2017   No results found for: PTT

## 2017-12-17 NOTE — NC FL2 (Signed)
[place] LEVEL OF CARE SCREENING TOOL     IDENTIFICATION  Patient Name: Shelby Reese Birthdate: 1935/06/28 Sex: female Admission Date (Current Location): 12/12/2017  [facility] and [place] Number:  Herbalist and Address:  [facility], [place] [address] [place], [place] [address]      Provider Number: [id number]  Attending Physician Name and Address:  Modena Jansky, MD  Relative Name and Phone Number:       Current Level of Care: Hospital Recommended Level of Care: [place] Prior Approval Number:    Date Approved/Denied:   PASRR Number: [id number]  Discharge Plan: SNF    Current Diagnoses: Patient Active Problem List   Diagnosis Date Noted  . Acute blood loss as cause of postoperative anemia 12/13/2017  . Wound of right leg, sequela 12/12/2017  . Open wound of right knee 11/28/2017  . Allergic drug rash   . Sepsis ([place]) 08/29/2017  . CKD (chronic kidney disease), stage III ([place]) 08/29/2017  . Hypothyroid 08/29/2017  . Hypertension 08/29/2017  . Peripheral vascular disease ([place]) 08/29/2017  . Skin ulcer of knee, right, with fat layer exposed ([place]) 08/29/2017  . Acute kidney injury ([place]) 08/29/2017  . Metabolic acidosis 29/93/7169  . Acute hyponatremia 08/29/2017  . Pressure injury of skin 07/25/2017  . Wound, open, knee, lower leg, or ankle with complication, right, initial encounter   . Idiopathic chronic venous hypertension of both lower extremities with ulcer and inflammation ([place]) 07/15/2017  . Skin ulcer of right knee with necrosis of muscle ([place]) 07/15/2017  . Atherosclerosis of artery of right lower extremity ([place]) 07/11/2017  . Essential hypertension 07/11/2017  . Osteoporosis 07/11/2017  . Cataracts, bilateral 07/11/2017  . Hypothyroidism, acquired 12/27/2014  . Iron deficiency anemia 12/27/2014  . Hiatal hernia 12/27/2014  . Varicose veins of lower extremities with other complications  67/89/3810    Orientation RESPIRATION BLADDER Height & Weight     Self, Time, Situation, Place  Normal Continent Weight: 227 lb (103 kg) Height:  5\' 3"  (160 cm)  BEHAVIORAL SYMPTOMS/MOOD NEUROLOGICAL BOWEL NUTRITION STATUS      Continent Diet(Please see d/c summary, Regular Diet)  AMBULATORY STATUS COMMUNICATION OF NEEDS Skin   Extensive Assist Verbally Surgical wounds(Deep Tissue Wound)                       Personal Care Assistance Level of Assistance  Bathing, Feeding, Dressing Bathing Assistance: Maximum assistance Feeding assistance: Independent Dressing Assistance: Maximum assistance     Functional Limitations Info  Sight, Hearing, Speech Sight Info: Adequate Hearing Info: Adequate Speech Info: Adequate    SPECIAL CARE FACTORS FREQUENCY  PT (By licensed PT), OT (By licensed OT)     PT Frequency: 5x week OT Frequency: 5x week            Contractures Contractures Info: Not present    Additional Factors Info  Code Status, Allergies Code Status Info: Full Allergies Info: CINNAMON, CIPROFLOXACIN, DIOVAN VALSARTAN, FOOD, LATEX, NITROFURAN DERIVATIVES, PENICILLINS, BACTRIM SULFAMETHOXAZOLE-TRIMETHOPRIM, OTHER, SULFA ANTIBIOTICS            Current Medications (12/17/2017):  This is the current hospital active medication list Current Facility-Administered Medications  Medication Dose Route Frequency Provider Last Rate Last Dose  . acetaminophen (TYLENOL) tablet 325-650 mg  325-650 mg Oral Q4H PRN Elam Dutch, MD       Or  . acetaminophen (TYLENOL) suppository 325-650 mg  325-650 mg Rectal  Q4H PRN Elam Dutch, MD      . alum & mag hydroxide-simeth (MAALOX/MYLANTA) 200-200-20 MG/5ML suspension 15-30 mL  15-30 mL Oral Q2H PRN Elam Dutch, MD      . ceFEPIme (MAXIPIME) 2 g in dextrose 5 % 50 mL IVPB  2 g Intravenous Q24H Desiree Hane, MD   Stopped at 12/17/17 0140  . docusate sodium (COLACE) capsule 100 mg  100 mg Oral Daily Elam Dutch, MD   100 mg at 12/17/17 0834  . famotidine (PEPCID) tablet 40 mg  40 mg Oral Q1500 Oretha Milch D, MD   40 mg at 12/16/17 1639  . guaiFENesin-dextromethorphan (ROBITUSSIN DM) 100-10 MG/5ML syrup 15 mL  15 mL Oral Q4H PRN Elam Dutch, MD      . heparin injection 5,000 Units  5,000 Units Subcutaneous Q8H Elam Dutch, MD   5,000 Units at 12/17/17 0540  . hydrALAZINE (APRESOLINE) injection 5 mg  5 mg Intravenous Q20 Min PRN Elam Dutch, MD      . HYDROmorphone (DILAUDID) injection 0.5-1 mg  0.5-1 mg Intravenous Q3H PRN Caren Griffins, MD   1 mg at 12/17/17 1287  . labetalol (NORMODYNE,TRANDATE) injection 10 mg  10 mg Intravenous Q10 min PRN Elam Dutch, MD      . lactated ringers infusion   Intravenous Continuous Ellender, Karyl Kinnier, MD      . lactated ringers infusion   Intravenous Continuous Ellender, Karyl Kinnier, MD 10 mL/hr at 12/15/17 1425    . levothyroxine (SYNTHROID, LEVOTHROID) tablet 175 mcg  175 mcg Oral QAC breakfast Oretha Milch D, MD   175 mcg at 12/17/17 0834  . magnesium sulfate IVPB 2 g 50 mL  2 g Intravenous Daily PRN Elam Dutch, MD      . metoprolol tartrate (LOPRESSOR) injection 2-5 mg  2-5 mg Intravenous Q2H PRN Elam Dutch, MD      . metroNIDAZOLE (FLAGYL) IVPB 500 mg  500 mg Intravenous Q8H Oretha Milch D, MD 100 mL/hr at 12/17/17 1114 500 mg at 12/17/17 1114  . morphine 4 MG/ML injection 4 mg  4 mg Intravenous Q1H PRN Dagoberto Ligas, PA-C   4 mg at 12/17/17 0845  . multivitamin with minerals tablet 1 tablet  1 tablet Oral Daily Caren Griffins, MD   1 tablet at 12/17/17 0834  . nutrition supplement (JUVEN) (JUVEN) powder packet 1 packet  1 packet Oral BID BM Gherghe, Vella Redhead, MD      . ondansetron (ZOFRAN) injection 4 mg  4 mg Intravenous Once Isla Pence, MD      . ondansetron [facility]) tablet 4 mg  4 mg Oral Q6H PRN Oretha Milch D, MD       Or  . ondansetron (ZOFRAN) injection 4 mg  4 mg Intravenous Q6H PRN Oretha Milch D,  MD      . oxyCODONE-acetaminophen (PERCOCET/ROXICET) 5-325 MG per tablet 1 tablet  1 tablet Oral Q4H PRN Oretha Milch D, MD   1 tablet at 12/17/17 0540  . pantoprazole (PROTONIX) EC tablet 40 mg  40 mg Oral Daily Elam Dutch, MD   40 mg at 12/17/17 0834  . phenol (CHLORASEPTIC) mouth spray 1 spray  1 spray Mouth/Throat PRN Fields, Jessy Oto, MD      . polyethylene glycol (MIRALAX / GLYCOLAX) packet 17 g  17 g Oral BID Reyne Dumas, MD   17 g at 12/16/17 0953  . potassium chloride SA (K-DUR,KLOR-CON) CR  tablet 20-40 mEq  20-40 mEq Oral Daily PRN Elam Dutch, MD      . senna [facility]) tablet 17.2 mg  2 tablet Oral Daily Hongalgi, Lenis Dickinson, MD      . vancomycin (VANCOCIN) 1,250 mg in sodium chloride 0.9 % 250 mL IVPB  1,250 mg Intravenous Q24H Romona Curls, RPH 166.7 mL/hr at 12/17/17 1114 1,250 mg at 12/17/17 1114     Discharge Medications: Please see discharge summary for a list of discharge medications.  Relevant Imaging Results:  Relevant Lab Results:   Additional Information SSN: [place], LCSW

## 2017-12-18 ENCOUNTER — Other Ambulatory Visit: Payer: Self-pay | Admitting: *Deleted

## 2017-12-18 ENCOUNTER — Encounter (HOSPITAL_COMMUNITY): Payer: Medicare Other

## 2017-12-18 ENCOUNTER — Encounter: Payer: Self-pay | Admitting: *Deleted

## 2017-12-18 LAB — CBC
HCT: 26.2 % — ABNORMAL LOW (ref 36.0–46.0)
Hemoglobin: 7.8 g/dL — ABNORMAL LOW (ref 12.0–15.0)
MCH: 25 pg — AB (ref 26.0–34.0)
MCHC: 29.8 g/dL — AB (ref 30.0–36.0)
MCV: 84 fL (ref 78.0–100.0)
PLATELETS: 379 10*3/uL (ref 150–400)
RBC: 3.12 MIL/uL — ABNORMAL LOW (ref 3.87–5.11)
RDW: 17.2 % — ABNORMAL HIGH (ref 11.5–15.5)
WBC: 13.6 10*3/uL — ABNORMAL HIGH (ref 4.0–10.5)

## 2017-12-18 LAB — BASIC METABOLIC PANEL
Anion gap: 10 (ref 5–15)
BUN: 20 mg/dL (ref 6–20)
CO2: 20 mmol/L — ABNORMAL LOW (ref 22–32)
CREATININE: 1.16 mg/dL — AB (ref 0.44–1.00)
Calcium: 8.2 mg/dL — ABNORMAL LOW (ref 8.9–10.3)
Chloride: 102 mmol/L (ref 101–111)
GFR calc Af Amer: 50 mL/min — ABNORMAL LOW (ref >60)
GFR, EST NON AFRICAN AMERICAN: 43 mL/min — AB (ref >60)
GLUCOSE: 124 mg/dL — AB (ref 65–99)
Potassium: 3.6 mmol/L (ref 3.5–5.1)
SODIUM: 132 mmol/L — AB (ref 135–145)

## 2017-12-18 MED ORDER — CYCLOBENZAPRINE HCL 10 MG PO TABS
5.0000 mg | ORAL_TABLET | Freq: Once | ORAL | Status: AC
  Administered 2017-12-18: 5 mg via ORAL
  Filled 2017-12-18: qty 1

## 2017-12-18 NOTE — Consult Note (Signed)
   [facility] Inpatient Consult   12/18/2017  Shelby Reese Sep 30, 1935 [id number]  Chart reviewed patient to follow up on disposition. [facility] nurse aware of patient likely to go to a skilled facility when medically ready.  Met with the patient at the bedside and patient states, "well, we are contiplating another surgical procedure my left leg but, yes I will likely have to return to a skill facility and I am not sure I will ever be able to return to my home."  Will continue to follow up.  Patient given a brochure, 24 hour nurse advise line magnet with contact information.  For questions, please contact:    Natividad Brood, RN BSN [facility] Liaison  [phone] business mobile phone Toll free office [phone]

## 2017-12-18 NOTE — Progress Notes (Addendum)
  Progress Note    12/18/2017 9:44 AM 3 Days Post-Op  Subjective:  C/o pain in her left leg from the ankle up.  Says she has phantom pain in the right leg.   Vitals:   12/17/17 2005 12/18/17 0500  BP: (!) 143/45 (!) 136/45  Pulse: 90 72  Resp: 18 16  Temp: 98.3 F (36.8 C) 98.1 F (36.7 C)  SpO2: 98% 97%    Physical Exam: Incisions:  Stump sock in place right amp site. Left leg:      CBC    Component Value Date/Time   WBC 13.6 (H) 12/18/2017 0832   RBC 3.12 (L) 12/18/2017 0832   HGB 7.8 (L) 12/18/2017 0832   HGB 11.9 04/15/2013 1516   HCT 26.2 (L) 12/18/2017 0832   HCT 38.7 04/15/2013 1516   PLT 379 12/18/2017 0832   PLT 289 04/15/2013 1516   MCV 84.0 12/18/2017 0832   MCV 88.0 12/25/2014 1540   MCV 89.6 04/15/2013 1516   MCH 25.0 (L) 12/18/2017 0832   MCHC 29.8 (L) 12/18/2017 0832   RDW 17.2 (H) 12/18/2017 0832   RDW 15.1 (H) 04/15/2013 1516   LYMPHSABS 1.5 12/12/2017 1527   LYMPHSABS 2.4 04/15/2013 1516   MONOABS 1.6 (H) 12/12/2017 1527   MONOABS 0.6 04/15/2013 1516   EOSABS 0.4 12/12/2017 1527   EOSABS 0.5 04/15/2013 1516   BASOSABS 0.0 12/12/2017 1527   BASOSABS 0.0 04/15/2013 1516    BMET    Component Value Date/Time   NA 130 (L) 12/17/2017 0533   NA 133 (A) 12/11/2017   NA 140 04/15/2013 1516   K 4.0 12/17/2017 0533   K 4.3 04/15/2013 1516   CL 99 (L) 12/17/2017 0533   CL 105 04/15/2013 1516   CO2 21 (L) 12/17/2017 0533   CO2 25 04/15/2013 1516   GLUCOSE 97 12/17/2017 0533   GLUCOSE 94 04/15/2013 1516   BUN 17 12/17/2017 0533   BUN 16 12/11/2017   BUN 28.2 (H) 04/15/2013 1516   CREATININE 1.04 (H) 12/17/2017 0533   CREATININE 1.21 (H) 12/25/2014 1530   CREATININE 1.4 (H) 04/15/2013 1516   CALCIUM 8.3 (L) 12/17/2017 0533   CALCIUM 9.9 04/15/2013 1516   GFRNONAA 49 (L) 12/17/2017 0533   GFRNONAA 43 (L) 12/25/2014 1530   GFRAA 57 (L) 12/17/2017 0533   GFRAA 50 (L) 12/25/2014 1530    INR    Component Value Date/Time   INR 1.10  12/15/2017 0716     Intake/Output Summary (Last 24 hours) at 12/18/2017 0944 Last data filed at 12/18/2017 0500 Gross per 24 hour  Intake 476 ml  Output 1600 ml  Net -1124 ml     Assessment/Plan:  83 y.o. female is s/p right above knee amputation  3 Days Post-Op  -pt's incision looked good yesterday.  Stump sock in place today.  -pt with severe leg pain on the left.  Unable to obtain ABI's due to pain.  Unable to touch the back of her leg.  She understands that she is at high risk for amputation and may need for pain control as well.  Will discuss further with Dr. Oneida Alar tomorrow.  -arterial duplex pending if she can tolerate   Leontine Locket, PA-C [facility] [phone] 12/18/2017 9:44 AM

## 2017-12-18 NOTE — Consult Note (Signed)
[facility] Nurse wound consult note Reason for Consult:left foot wound at the flexor surface DTI and partial thickness. Left posterior heel unstageable Wound type:MDRPI and pressure Pressure Injury POA: No, heel not noted on this admission, flexor was present last admission Measurement: Left flexor area of foot 11cm x 6.5cm x 0.1, 60% black, 20% pink, 20% intact serous filled blister.  Left heel 4cm x 4cm x 0cm black eschar. Wound bed: see avoce Drainage (amount, consistency, odor) none Periwound:redness from knee down to distal end of wound Dressing procedure/placement/frequency: I have provided nurses with orders for Xeroform to flexor wound, Betadine to heel and allow air dry. No Prevalon boot due to other injury, keep elevated on pillow.  Pt has poor nutrition but that has been addressed. Vascular PAs in to see patient while I was present.  Patient states she is thinking about just amputating the left leg also. States the pain has become intolerable. We will not follow, but will remain available to this patient, to nursing, and the medical and/or surgical teams.  Please re-consult if we need to assist further.   Fara Olden, RN-C, WTA-C Wound Treatment Associate

## 2017-12-18 NOTE — Patient Outreach (Signed)
[facility]) Care Management  12/18/2017  ANJELICA GORNIAK [birth year] [id number]    Report received pt admitted to [facility] for rehab services. Will close this case.  Raina Mina, RN Care Management Coordinator [place] Office [phone]

## 2017-12-18 NOTE — Progress Notes (Signed)
PROGRESS NOTE  Shelby Reese VEL:[id number] DOB: 03/19/1935 DOA: 12/12/2017 PCP: Shelby Pretty, MD   LOS: 6 days   Brief Narrative / Interim history: 83 y.o. female with medical history significant for PVD and persistent ulceration of right knee (medially) who underwent limb salvage intervention with debridement, skin graft therapy and wound VAC therapy most recently on 12/05/2017  who presented on 12/12/2017 after evaluation from physician at her SNF with worsening right knee pain and malodorous drainage and was found to be septic secondary to the right knee wound.  Patient was previously evaluated in the ED on 12/09/17 for increased drainage with foul odor.  ED physicians did not visualize wound due to postoperative dressings, lab work and vitals at the time were stable and unremarkable so plans were made for patient to follow-up with her orthopedic surgeon on 1/24.  Dr. Sharol Reese evaluated Shelby Reese on 1/24.  At that time patient stated she cannot tolerate continued compression therapy and current wound care.  Initial plan was for patient to undergo an AKA however patient then requested to cancel surgery as she was seeking a second opinion on wound care, and Dr. Oneida Reese was consulted and agreed to see patient in consultation. Patient is now postop s/p right above-knee amputation.  Assessment & Plan: Principal Problem:   Sepsis ([place]) Active Problems:   Iron deficiency anemia   CKD (chronic kidney disease), stage III (HCC)   Wound of right leg, sequela   Sepsis secondary to right knee wound infection -Patient status post multiple irrigation and debridements, and previous wound VAC (removed recently).  Low-grade fever, leukocytosis, and likely source meet criteria for sepsis. - Vascular surgery consulted , now status post right above knee amputation on 1/28, by Dr. Oneida Reese - Continue current vancomycin, Flagyl and cefepime, due to cellulitis of the left lower extremity,  - WOC consultation for wound on  the dorsum of the left foot with blister. - Blood cultures x2: Negative to date, final report.  MRSA PCR negative. -Has been on IV antibiotics since 1/25, now for left lower extremity cellulitis. - R AKA stump without acute findings.  Acute on chronic anemia, iron deficiency -Hemoglobin on admission 6.7, likely in the setting of chronic disease as well as chronic blood loss from her wound, she received 2 units of packed red blood cells , hemoglobin came up to 9.2, but now trending down, 7.9 on 1/30. -Follow CBC in a.m. and transfuse if less than 7 g per DL. -Hemoglobin stable/7.8 compared to yesterday.  Hypertension, relative hypotension, likely related to sepsis -Lactic acid within normal limits.  Status post 1 L bolus normal saline in the ED -Blood pressure is now normalized  CKD stage III, stable -Baseline creatinine 1.4-1.6.   -Creatinine has normalized.  Follow BMP periodically.  Creatinine slightly up to 1.16.  Follow periodically.  Left lower extremity edema/cellulitis Suspect patient has underlying peripheral vascular disease, no history of lymphedema and venous insufficiency now with significant left lower extremity edema and blistering Will check ABI on the left lower extremity-patient unable to tolerate. Started patient on diuresis with IV Lasix to see if this swelling in the left lower extremity improves Also check 2-D echo to rule out congestive heart failure: Normal EF and grade 2 diastolic dysfunction. Continue empiric antibiotics as above No significant change in edema.  Still has erythema, increased warmth and concern for ongoing cellulitis Discussed with Dr. Oneida Reese suspect PAD, await arterial Dopplers for further recommendations, continue antibiotics, no surgical intervention had been discussed  with patient.  DVT prophylaxis: SCDs Code Status: Full code Family Communication: no family at bedside Disposition Plan: DC to SNF pending clinical  improvement.  Consultants:   Vascular surgery, Dr. Oneida Reese  Procedures:   None   Antimicrobials:  Cefepime 1/25 >>  Vancomycin 1/25 >>  IV Flagyl 1/25 >.  Subjective: Patient states that she was told that she will be going for left lower extremity surgery by the surgeon.  I confirmed with the surgeon that this was not true.  Ongoing pain in the left lower extremity.  Happy with diet change to regular diet.  Reportedly had BM yesterday.  Objective: Vitals:   12/17/17 1311 12/17/17 2005 12/18/17 0500 12/18/17 1300  BP: (!) 136/49 (!) 143/45 (!) 136/45 (!) 131/47  Pulse: 85 90 72 82  Resp: 17 18 16 18   Temp:  98.3 F (36.8 C) 98.1 F (36.7 C) 98.5 F (36.9 C)  TempSrc:  Oral Oral Oral  SpO2: 98% 98% 97% 100%  Weight:      Height:        Intake/Output Summary (Last 24 hours) at 12/18/2017 1438 Last data filed at 12/18/2017 1300 Gross per 24 hour  Intake 600 ml  Output 1200 ml  Net -600 ml   Filed Weights   12/12/17 1442  Weight: 103 kg (227 lb)    Examination:  Constitutional: Elderly female, moderately built and obese, lying comfortably supine in bed. Respiratory: Slightly diminished breath sounds in the bases but otherwise clear to auscultation without wheezing, rhonchi or crackles.  No increased work of breathing.  Stable without change. Cardiovascular: S1 and S2 heard, RRR.  No JVD or murmurs.  Left lower extremity 1+ pitting edema.  Telemetry: Sinus rhythm.  Stable without change. Abdomen: Soft, nontender, nondistended.  Normal bowel sounds heard. Musculoskeletal: Large blister on the dorsum of left foot.  Patchy erythema with increased warmth and some tenderness in left leg associated with edema.  Skin appears chronic indurated.  Please see pictures below. Neurologic: Alert and oriented x2.  No focal neurological deficits.         Data Reviewed: I have independently reviewed following labs and imaging studies   CBC: Recent Labs  Lab 12/12/17 1527   12/15/17 0716 12/15/17 1843 12/16/17 0548 12/17/17 0533 12/18/17 0832  WBC 14.5*  --  11.5* 13.2* 17.4* 13.9* 13.6*  NEUTROABS 11.0*  --   --   --   --   --   --   HGB 6.7*   < > 9.4* 8.3* 8.0* 7.9* 7.8*  HCT 21.9*   < > 30.9* 26.5* 26.5* 25.4* 26.2*  MCV 79.6  --  84.7 84.4 83.9 84.7 84.0  PLT 459*  --  422* 397 381 391 379   < > = values in this interval not displayed.   Basic Metabolic Panel: Recent Labs  Lab 12/13/17 0844  12/15/17 0716 12/15/17 1843 12/16/17 0548 12/17/17 0533 12/18/17 0832  NA 132*  --  133*  --  131* 130* 132*  K 3.5  --  3.4*  --  4.0 4.0 3.6  CL 101  --  101  --  100* 99* 102  CO2 19*  --  21*  --  21* 21* 20*  GLUCOSE 91  --  107*  --  155* 97 124*  BUN 16  --  20  --  17 17 20   CREATININE 1.35*   < > 1.30* 1.19* 1.02* 1.04* 1.16*  CALCIUM 8.4*  --  8.5*  --  8.1* 8.3* 8.2*   < > = values in this interval not displayed.   GFR: Estimated Creatinine Clearance: 44.3 mL/min (A) (by C-G formula based on SCr of 1.16 mg/dL (H)). Liver Function Tests: Recent Labs  Lab 12/12/17 1527 12/14/17 0612 12/15/17 0716 12/17/17 0533  AST 18  --  18 21  ALT 13*  --  14 13*  ALKPHOS 69  --  70 65  BILITOT 0.2*  --  0.6 0.7  PROT 6.3*  --  6.3* 5.7*  ALBUMIN 2.5* 2.2* 2.4* 2.0*   Coagulation Profile: Recent Labs  Lab 12/15/17 0716  INR 1.10   BNP (last 3 results) No results for input(s): PROBNP in the last 8760 hours.  CBG: Recent Labs  Lab 12/13/17 1153 12/13/17 1609  GLUCAP 103* 94   Urine analysis:    Component Value Date/Time   COLORURINE AMBER (A) 12/12/2017 1719   APPEARANCEUR CLEAR 12/12/2017 1719   LABSPEC 1.020 12/12/2017 1719   PHURINE 5.0 12/12/2017 1719   GLUCOSEU NEGATIVE 12/12/2017 1719   HGBUR NEGATIVE 12/12/2017 1719   BILIRUBINUR NEGATIVE 12/12/2017 1719   KETONESUR NEGATIVE 12/12/2017 1719   PROTEINUR NEGATIVE 12/12/2017 1719   NITRITE POSITIVE (A) 12/12/2017 1719   LEUKOCYTESUR MODERATE (A) 12/12/2017 1719      Recent Results (from the past 240 hour(s))  Culture, blood (routine x 2)     Status: None   Collection Time: 12/12/17  3:25 PM  Result Value Ref Range Status   Specimen Description BLOOD RIGHT FOREARM  Final   Special Requests IN PEDIATRIC BOTTLE Blood Culture adequate volume  Final   Culture   Final    NO GROWTH 5 DAYS Performed at [facility], [place] [address] [place], [place] [address]    Report Status 12/17/2017 FINAL  Final  Culture, blood (routine x 2)     Status: None   Collection Time: 12/12/17  3:26 PM  Result Value Ref Range Status   Specimen Description BLOOD LEFT FOREARM  Final   Special Requests   Final    BOTTLES DRAWN AEROBIC AND ANAEROBIC Blood Culture adequate volume   Culture   Final    NO GROWTH 5 DAYS Performed at [facility], [place] [address] [place], [place] [address]    Report Status 12/17/2017 FINAL  Final  Surgical PCR screen     Status: None   Collection Time: 12/15/17 10:15 AM  Result Value Ref Range Status   MRSA, PCR NEGATIVE NEGATIVE Final   Staphylococcus aureus NEGATIVE NEGATIVE Final    Comment: (NOTE) The Xpert SA Assay (FDA approved for NASAL specimens in patients 41 years of age and older), is one component of a comprehensive surveillance program. It is not intended to diagnose infection nor to guide or monitor treatment.       Radiology Studies: No results found.   Scheduled Meds: . docusate sodium  100 mg Oral Daily  . famotidine  40 mg Oral Q1500  . feeding supplement (ENSURE ENLIVE)  237 mL Oral TID WC  . heparin  5,000 Units Subcutaneous Q8H  . levothyroxine  175 mcg Oral QAC breakfast  . multivitamin with minerals  1 tablet Oral Daily  . nutrition supplement (JUVEN)  1 packet Oral BID BM  . pantoprazole  40 mg Oral Daily  . polyethylene glycol  17 g Oral BID  . senna  2 tablet Oral Daily   Continuous Infusions: . ceFEPime (MAXIPIME) IV Stopped (12/18/17 0217)  . metronidazole  Stopped (12/18/17 0844)   . vancomycin 1,250 mg (12/18/17 1057)    Vernell Leep, MD, FACP, [facility]. [facility] Pager [phone]  If 7PM-7AM, please contact night-coverage [email or web address] Password [facility] 12/18/2017, 2:38 PM

## 2017-12-19 ENCOUNTER — Inpatient Hospital Stay (HOSPITAL_COMMUNITY): Payer: Medicare Other

## 2017-12-19 DIAGNOSIS — L0390 Cellulitis, unspecified: Secondary | ICD-10-CM

## 2017-12-19 LAB — CBC
HCT: 24 % — ABNORMAL LOW (ref 36.0–46.0)
Hemoglobin: 7.2 g/dL — ABNORMAL LOW (ref 12.0–15.0)
MCH: 25.1 pg — AB (ref 26.0–34.0)
MCHC: 30 g/dL (ref 30.0–36.0)
MCV: 83.6 fL (ref 78.0–100.0)
PLATELETS: 394 10*3/uL (ref 150–400)
RBC: 2.87 MIL/uL — AB (ref 3.87–5.11)
RDW: 17.4 % — ABNORMAL HIGH (ref 11.5–15.5)
WBC: 11.4 10*3/uL — AB (ref 4.0–10.5)

## 2017-12-19 LAB — BASIC METABOLIC PANEL
ANION GAP: 8 (ref 5–15)
BUN: 18 mg/dL (ref 6–20)
CO2: 23 mmol/L (ref 22–32)
Calcium: 8.1 mg/dL — ABNORMAL LOW (ref 8.9–10.3)
Chloride: 102 mmol/L (ref 101–111)
Creatinine, Ser: 1.04 mg/dL — ABNORMAL HIGH (ref 0.44–1.00)
GFR calc Af Amer: 57 mL/min — ABNORMAL LOW (ref >60)
GFR, EST NON AFRICAN AMERICAN: 49 mL/min — AB (ref >60)
GLUCOSE: 92 mg/dL (ref 65–99)
Potassium: 3.6 mmol/L (ref 3.5–5.1)
Sodium: 133 mmol/L — ABNORMAL LOW (ref 135–145)

## 2017-12-19 LAB — VANCOMYCIN, TROUGH: Vancomycin Tr: 24 ug/mL (ref 15–20)

## 2017-12-19 MED ORDER — SODIUM CHLORIDE 0.9 % IV SOLN: Freq: Once | INTRAVENOUS | Status: DC

## 2017-12-19 MED ORDER — MORPHINE SULFATE (PF) 2 MG/ML IV SOLN
2.0000 mg | INTRAVENOUS | Status: DC | PRN
  Administered 2017-12-19 – 2017-12-23 (×24): 2 mg via INTRAVENOUS
  Filled 2017-12-19 (×23): qty 1

## 2017-12-19 MED ORDER — ALPRAZOLAM 0.25 MG PO TABS
0.2500 mg | ORAL_TABLET | Freq: Once | ORAL | Status: AC
  Administered 2017-12-20: 0.25 mg via ORAL
  Filled 2017-12-19: qty 1

## 2017-12-19 MED ORDER — VANCOMYCIN HCL 10 G IV SOLR
1250.0000 mg | INTRAVENOUS | Status: DC
  Administered 2017-12-20 – 2017-12-22 (×2): 1250 mg via INTRAVENOUS
  Filled 2017-12-19 (×2): qty 1250

## 2017-12-19 NOTE — Progress Notes (Signed)
Pt is very anxious about her husband's condition. Per pt "I don't know who will take care of him since I'm here and we don't have any support". Chaplain was called for emotional support.

## 2017-12-19 NOTE — Progress Notes (Signed)
Wound care and dressing changed on left foot, Pt tolerated well.

## 2017-12-19 NOTE — Progress Notes (Signed)
PROGRESS NOTE  Shelby Reese:[id number] DOB: 11/06/1935 DOA: 12/12/2017 PCP: [name], MD   LOS: 7 days   Brief Narrative / Interim history: 83 y.o. female with medical history significant for PVD and persistent ulceration of right knee (medially) who underwent limb salvage intervention with debridement, skin graft therapy and wound VAC therapy most recently on 12/05/2017  who presented on 12/12/2017 after evaluation from physician at her SNF with worsening right knee pain and malodorous drainage and was found to be septic secondary to the right knee wound.  Patient was previously evaluated in the ED on 12/09/17 for increased drainage with foul odor.  ED physicians did not visualize wound due to postoperative dressings, lab work and vitals at the time were stable and unremarkable so plans were made for patient to follow-up with her orthopedic surgeon on 1/24.  Dr. Sharol Given evaluated Mrs. Gangwer on 1/24.  At that time patient stated she cannot tolerate continued compression therapy and current wound care.  Initial plan was for patient to undergo an AKA however patient then requested to cancel surgery as she was seeking a second opinion on wound care, and Dr. Oneida Alar was consulted and agreed to see patient in consultation. Patient is now postop s/p right above-knee amputation.  Currently being managed for left leg cellulitis/wounds in the context of suspected PAD for which she is being evaluated by vascular surgery.  Assessment & Plan: Principal Problem:   Sepsis ([place]) Active Problems:   Iron deficiency anemia   CKD (chronic kidney disease), stage III (HCC)   Wound of right leg, sequela   Sepsis secondary to right knee wound infection -Patient status post multiple irrigation and debridements, and previous wound VAC (removed recently).  Low-grade fever, leukocytosis, and likely source meet criteria for sepsis. - Vascular surgery consulted , now status post right above knee amputation on 1/28, by Dr.  Oneida Alar - Completed antibiotics for this indication. - R AKA stump without acute findings.  Acute on chronic anemia, iron deficiency -Hemoglobin on admission 6.7, likely in the setting of chronic disease as well as chronic blood loss from her wound, she received 2 units of packed red blood cells , hemoglobin came up to 9.2, but now trending down and down to 7.2 on 2/1.  No overt bleeding. -Follow CBC in a.m. and transfuse if hemoglobin 7 or less.  Hypertension, relative hypotension, likely related to sepsis - Hypertension controlled.  CKD stage III, stable -Baseline creatinine 1.4-1.6.   -Creatinine has normalized.    Left lower extremity edema/cellulitis Suspect patient has underlying peripheral vascular disease, no history of lymphedema and venous insufficiency now with significant left lower extremity edema and blistering ABI on the left lower extremity-patient unable to tolerate. Diuresed with couple days of IV Lasix with some but not significant improvement of left lower extremity edema. 2D echo: Normal EF and grade 2 diastolic dysfunction. Remains on empiric IV antibiotics since 1/25: Cefepime, metronidazole and vancomycin.  Vancomycin trough elevated/24 on 2/1, pharmacy managing. Cellulitis features slowly improving. WOC input appreciated. Blood cultures x2: Negative, final report.  MRSA PCR negative. Discussed with Dr. Oneida Alar 1/31> suspect PAD, await arterial Dopplers for further recommendations (pending), continue antibiotics, no surgical intervention had been discussed with patient.  DVT prophylaxis: SCDs Code Status: Full code Family Communication: no family at bedside Disposition Plan: DC to SNF pending clinical improvement.  Consultants:   Vascular surgery, Dr. Oneida Alar  Procedures:   As above  Antimicrobials:  Cefepime 1/25 >>  Vancomycin 1/25 >>  IV Flagyl 1/25 >.  Subjective: Patient was seen prior to going down for procedure.  Denies complaints.  No  pain reported.  Again did not like her food this morning.  Objective: Vitals:   12/18/17 1300 12/18/17 2100 12/19/17 0658 12/19/17 1300  BP: (!) 131/47 (!) 142/42 (!) 159/51 (!) 138/46  Pulse: 82 83 78 85  Resp: 18  20 18   Temp: 98.5 F (36.9 C) 98.9 F (37.2 C) 99.3 F (37.4 C) 98.1 F (36.7 C)  TempSrc: Oral Oral Oral Oral  SpO2: 100% 98% 95% 97%  Weight:      Height:        Intake/Output Summary (Last 24 hours) at 12/19/2017 1407 Last data filed at 12/19/2017 1300 Gross per 24 hour  Intake 480 ml  Output 800 ml  Net -320 ml   Filed Weights   12/12/17 1442  Weight: 103 kg (227 lb)    Examination:  Constitutional: Elderly female, moderately built and obese, lying comfortably supine in bed.  Does not appear in any distress. Respiratory: Clear to auscultation.  No increased work of breathing. Cardiovascular: S1 and S2 heard, RRR.  No JVD or murmurs.  Left lower extremity 1+ pitting edema, somewhat better compared to 2 days ago.  Abdomen: Soft, nontender, nondistended.  Normal bowel sounds heard.  Stable without change. Musculoskeletal: Dressing over wound on dorsum of left foot clean, dry and intact.  Patchy faint redness, chronic appearing edema, no increased in warmth, no tenderness of left leg.  Please see picture below taken 12/18/17. Neurologic: Alert and oriented x2.  No focal neurological deficits.         Data Reviewed: I have independently reviewed following labs and imaging studies   CBC: Recent Labs  Lab 12/12/17 1527  12/15/17 1843 12/16/17 0548 12/17/17 0533 12/18/17 0832 12/19/17 0542  WBC 14.5*   < > 13.2* 17.4* 13.9* 13.6* 11.4*  NEUTROABS 11.0*  --   --   --   --   --   --   HGB 6.7*   < > 8.3* 8.0* 7.9* 7.8* 7.2*  HCT 21.9*   < > 26.5* 26.5* 25.4* 26.2* 24.0*  MCV 79.6   < > 84.4 83.9 84.7 84.0 83.6  PLT 459*   < > 397 381 391 379 394   < > = values in this interval not displayed.   Basic Metabolic Panel: Recent Labs  Lab 12/15/17 0716  12/15/17 1843 12/16/17 0548 12/17/17 0533 12/18/17 0832 12/19/17 0542  NA 133*  --  131* 130* 132* 133*  K 3.4*  --  4.0 4.0 3.6 3.6  CL 101  --  100* 99* 102 102  CO2 21*  --  21* 21* 20* 23  GLUCOSE 107*  --  155* 97 124* 92  BUN 20  --  17 17 20 18   CREATININE 1.30* 1.19* 1.02* 1.04* 1.16* 1.04*  CALCIUM 8.5*  --  8.1* 8.3* 8.2* 8.1*   GFR: Estimated Creatinine Clearance: 49.4 mL/min (A) (by C-G formula based on SCr of 1.04 mg/dL (H)). Liver Function Tests: Recent Labs  Lab 12/12/17 1527 12/14/17 0612 12/15/17 0716 12/17/17 0533  AST 18  --  18 21  ALT 13*  --  14 13*  ALKPHOS 69  --  70 65  BILITOT 0.2*  --  0.6 0.7  PROT 6.3*  --  6.3* 5.7*  ALBUMIN 2.5* 2.2* 2.4* 2.0*   Coagulation Profile: Recent Labs  Lab 12/15/17 0716  INR 1.10  BNP (last 3 results) No results for input(s): PROBNP in the last 8760 hours.  CBG: Recent Labs  Lab 12/13/17 1153 12/13/17 1609  GLUCAP 103* 94   Urine analysis:    Component Value Date/Time   COLORURINE AMBER (A) 12/12/2017 1719   APPEARANCEUR CLEAR 12/12/2017 1719   LABSPEC 1.020 12/12/2017 1719   PHURINE 5.0 12/12/2017 1719   GLUCOSEU NEGATIVE 12/12/2017 1719   HGBUR NEGATIVE 12/12/2017 1719   BILIRUBINUR NEGATIVE 12/12/2017 1719   KETONESUR NEGATIVE 12/12/2017 1719   PROTEINUR NEGATIVE 12/12/2017 1719   NITRITE POSITIVE (A) 12/12/2017 1719   LEUKOCYTESUR MODERATE (A) 12/12/2017 1719     Recent Results (from the past 240 hour(s))  Culture, blood (routine x 2)     Status: None   Collection Time: 12/12/17  3:25 PM  Result Value Ref Range Status   Specimen Description BLOOD RIGHT FOREARM  Final   Special Requests IN PEDIATRIC BOTTLE Blood Culture adequate volume  Final   Culture   Final    NO GROWTH 5 DAYS Performed at [facility], [place] [address] [place], [place] [address]    Report Status 12/17/2017 FINAL  Final  Culture, blood (routine x 2)     Status: None   Collection Time: 12/12/17  3:26 PM    Result Value Ref Range Status   Specimen Description BLOOD LEFT FOREARM  Final   Special Requests   Final    BOTTLES DRAWN AEROBIC AND ANAEROBIC Blood Culture adequate volume   Culture   Final    NO GROWTH 5 DAYS Performed at [facility], [place] [address] [place], [place] [address]    Report Status 12/17/2017 FINAL  Final  Surgical PCR screen     Status: None   Collection Time: 12/15/17 10:15 AM  Result Value Ref Range Status   MRSA, PCR NEGATIVE NEGATIVE Final   Staphylococcus aureus NEGATIVE NEGATIVE Final    Comment: (NOTE) The Xpert SA Assay (FDA approved for NASAL specimens in patients 43 years of age and older), is one component of a comprehensive surveillance program. It is not intended to diagnose infection nor to guide or monitor treatment.       Radiology Studies: No results found.   Scheduled Meds: . docusate sodium  100 mg Oral Daily  . famotidine  40 mg Oral Q1500  . feeding supplement (ENSURE ENLIVE)  237 mL Oral TID WC  . heparin  5,000 Units Subcutaneous Q8H  . levothyroxine  175 mcg Oral QAC breakfast  . multivitamin with minerals  1 tablet Oral Daily  . nutrition supplement (JUVEN)  1 packet Oral BID BM  . pantoprazole  40 mg Oral Daily  . polyethylene glycol  17 g Oral BID  . senna  2 tablet Oral Daily   Continuous Infusions: . ceFEPime (MAXIPIME) IV Stopped (12/19/17 0214)  . metronidazole Stopped (12/19/17 0953)  . [START ON 12/20/2017] vancomycin      Vernell Leep, MD, FACP, [facility]. [facility] Pager [phone]  If 7PM-7AM, please contact night-coverage [email or web address] Password [email or web address] 12/19/2017, 2:07 PM

## 2017-12-19 NOTE — Progress Notes (Signed)
CRITICAL VALUE ALERT  Critical Value:  Vancomycin trough =24  Date & Time Notied:  12/19/2017 11:40  Provider Notified: Dr. Algis Liming  Orders Received/Actions taken: MD notified, awaiting orders

## 2017-12-19 NOTE — Progress Notes (Signed)
Pt refused wound care, said that she does not need it at this moment and just wants to get some rest. Will ask again later.

## 2017-12-19 NOTE — Progress Notes (Signed)
Nurse requested visit .Patient has a great deal on her mind for her own health and now her husband Michael's health also as he passed out in her room when he learned of her leg needing to be amputated.  Had long visit to talk out some of the tension.  Staff is working to have staff from [facility] call this patient 5N to discuss what is happening with her husband.  Patient really needs support during all of this.   Conard Novak, Chaplain   12/19/17 2300  Clinical Encounter Type  Visited With Patient  Visit Type Initial;Psychological support;Spiritual support;Social support;Other (Comment) (patient very troubled)  Referral From Nurse  Consult/Referral To Chaplain  Spiritual Encounters  Spiritual Needs Prayer;Emotional;Other (Comment) (patient husband  passed out and admitted to  [phone])  Stress Factors  Patient Stress Factors Exhausted;Health changes;Major life changes;Other (Comment) (really needs support as facing amputation )  Family Stress Factors Other (Comment) (she is the strong one and husband not able to handle this)

## 2017-12-19 NOTE — Progress Notes (Signed)
Attempted to have Pt sign consent form for upcoming surgery on 02/04, Pt refused to sign and said that for now she is worried about her husband's condition and will not sign until she gets an update about his status.

## 2017-12-19 NOTE — Plan of Care (Signed)
  Nutrition: Adequate nutrition will be maintained 12/19/2017 1256 - Progressing by Williams Che, RN   Elimination: Will not experience complications related to bowel motility 12/19/2017 1256 - Progressing by Williams Che, RN   Pain Managment: General experience of comfort will improve 12/19/2017 1256 - Progressing by Williams Che, RN   Safety: Ability to remain free from injury will improve 12/19/2017 1256 - Progressing by Williams Che, RN   Skin Integrity: Risk for impaired skin integrity will decrease 12/19/2017 1256 - Progressing by Williams Che, RN

## 2017-12-19 NOTE — Care Management Important Message (Signed)
Important Message  Patient Details  Name: RENELLE STEGENGA MRN: [id number] Date of Birth: 11-08-1935   Medicare Important Message Given:  Yes    Carles Collet, RN 12/19/2017, 11:43 AM

## 2017-12-19 NOTE — Progress Notes (Signed)
Nutrition Follow Up  DOCUMENTATION CODES:   Obesity unspecified  INTERVENTION:    Ensure Enlive po BID, each supplement provides 350 kcal and 20 grams of protein  Juven po 1 packet BID, each supplement provides 80 kcal and 14 grams of amino acids  NUTRITION DIAGNOSIS:   Increased nutrient needs related to wound healing as evidenced by estimated needs, ongoing  GOAL:   Patient will meet greater than or equal to 90% of their needs, progressing  MONITOR:   PO intake, Supplement acceptance, Skin  ASSESSMENT:   Pt with PMH of CKD stage III, PVD and persistent ulceration of R knee who underwent limb salvage intervention with debridement, skin graft therapy, and wound VAC 1/18 now admitted 1/25 from SNF for worsening R knee pain, septic shock secondary to R knee wound.    Pt s/p AKA 1/26. PO intake 50% per flowsheet records. Receiving Ensure Enlive supplements TID & Juven BID. Medications include MVI, levothyroxine and ABX. Labs reviewed. Na 133 (L). Ca 8.1 (L).   Diet Order:  Diet regular Room service appropriate? Yes; Fluid consistency: Thin Diet NPO time specified Except for: Sips with Meds  EDUCATION NEEDS:   No education needs have been identified at this time  Skin:  Skin Integrity Issues:: DTI(chronic R knee wound) DTI: ankle  Last BM:  1/27  Height:   Ht Readings from Last 1 Encounters:  12/12/17 5\' 3"  (1.6 m)   Weight:   Wt Readings from Last 1 Encounters:  12/12/17 227 lb (103 kg)   Ideal Body Weight:  52.2 kg  BMI:  Body mass index is 40.21 kg/m.  Estimated Nutritional Needs:   Kcal:  1800-2000  Protein:  100-115 grams  Fluid:  > 1.8 L/day  Arthur Holms, RD, LDN Pager #: [phone] After-Hours Pager #: [phone]

## 2017-12-19 NOTE — Progress Notes (Signed)
Left lower extremity arterial duplex completed. Technically difficult . Stenosis of the CFA. Moderate to severe plaque. Mild plaque noted throughout with monophasic Doppler waveforms. Turbulent flow noted . Diminished flow in the posterior tibial artery. Difficult to evaluate due to size. [facility], [place] 12/19/2017, 3:01 PM

## 2017-12-19 NOTE — Progress Notes (Signed)
Pt is asking to see her husband's doctor who was admitted today and who is currently at [facility]. RN contacted his nurse Charlett Nose to let the MD know.  Pt was given her husband's room phone number.

## 2017-12-19 NOTE — Progress Notes (Addendum)
Awaiting completion of arterial duplex of left lower extremity.   She ws unable to tolerate ABI's.     Leontine Locket, [facility] 12/19/2017 9:39 AM   Pt seen and examined.  Right AKA continues to heal but sore. Left foot now with gangrenous heel.  Dorsal foot wound with blister no real healing.  Erythema to mid calf Duplex reviewed.  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  She is not really a candidate for any revascularization procedure due to her underlying overall debility.  She has basically been wheelchair or bedridden for 3  Months.  I discussed with her options of continued wound care or proceeding with left AKA.  I also told her I did not feel she would tolerate a bypass procedure to increase blood flow and with gangrenous heel likeliehood of salvaging foot would be low.  She has opted for left AKA.  Will proceed with left Aka Monday  Will place preop orders Have told pt she can get IV morphine for breakthrough pain  severe protein calorie malnutrition. Continue to supplement.  Check albumin again next week Type and screen and 2 U rbc for OR Monday since already very anemic will probably need perioperative transfusion. Continue antibiotics until after amputation Continue current local wound care Morphine 2 mg IV q1h prn breakthru pain  Ruta Hinds, MD [facility] Office: [phone] Pager: [phone]

## 2017-12-19 NOTE — Progress Notes (Signed)
Pharmacy Antibiotic Note  Shelby Reese is a 83 y.o. female now s/p R AKA on 1/28. Pharmacy has been consulted for vancomycin and cefepime dosing for wound infection. Also on Flagyl per MD.  Patient's renal function is improving slowly.  Her vancomycin trough is supra-therapeutic at 24 mcg/mL.  Afebrile, WBC down to 11.4.   Plan: Change vanc to 1250mg  IV Q48H, start tomorrow Cefepime 2gm IV Q24H Flagyl 500mg  IV Q8H per MD Monitor renal fxn, repeat vanc trough as indicated   Height: 5\' 3"  (160 cm) Weight: 227 lb (103 kg) IBW/kg (Calculated) : 52.4  Temp (24hrs), Avg:98.9 F (37.2 C), Min:98.5 F (36.9 C), Max:99.3 F (37.4 C)  Recent Labs  Lab 12/12/17 1531  12/15/17 1843 12/16/17 0548 12/17/17 0533 12/18/17 0832 12/19/17 0542 12/19/17 1026  WBC  --    < > 13.2* 17.4* 13.9* 13.6* 11.4*  --   CREATININE  --    < > 1.19* 1.02* 1.04* 1.16* 1.04*  --   LATICACIDVEN 0.61  --   --   --   --   --   --   --   VANCOTROUGH  --   --   --   --   --   --   --  24*   < > = values in this interval not displayed.    Estimated Creatinine Clearance: 49.4 mL/min (A) (by C-G formula based on SCr of 1.04 mg/dL (H)).    Allergies  Allergen Reactions  . Cinnamon Hives  . Ciprofloxacin Other (See Comments)    TREMORS  . Diovan [Valsartan] Other (See Comments)    Extreme vertigo  . Food Diarrhea and Other (See Comments)    ORANGE JUICE   UPSET STOMACH  . Latex Rash and Other (See Comments)    Rash/inflammation due to exposure  . Nitrofuran Derivatives Hives and Rash    "Full body rash"  . Penicillins Hives and Swelling    *tolerated Ceftriaxone September 2018 Has patient had a PCN reaction causing immediate rash, facial/tongue/throat swelling, SOB or lightheadedness with hypotension:No--severe irritation at the injection site Has patient had a PCN reaction causing severe rash involving mucus membranes or skin necrosis:Unknown Has patient had a PCN reaction that required  hospitalization:No Has patient had a PCN reaction occurring within the last 10 years:Yes If all of the above answers are "NO", then may proceed with  . Bactrim [Sulfamethoxazole-Trimethoprim] Diarrhea and Nausea Only  . Other Rash    Mycins, Strawberry, Oranges  . Sulfa Antibiotics Diarrhea and Nausea Only     Vanc 1/25 >> Cefepime 1/25 >> Flagyl 1/25 >>  2/1 VT = 24 mcg/mL on 1250mg  q24 >> 1250mg  q48 (SCr 1.04)  1/25 BCx - negative   Unnamed Hino D. Mina Marble, PharmD, BCPS Pager:  [phone] 12/19/2017, 12:19 PM

## 2017-12-20 DIAGNOSIS — D649 Anemia, unspecified: Secondary | ICD-10-CM

## 2017-12-20 DIAGNOSIS — A419 Sepsis, unspecified organism: Principal | ICD-10-CM

## 2017-12-20 DIAGNOSIS — I739 Peripheral vascular disease, unspecified: Secondary | ICD-10-CM

## 2017-12-20 LAB — BASIC METABOLIC PANEL
ANION GAP: 9 (ref 5–15)
BUN: 16 mg/dL (ref 6–20)
CO2: 22 mmol/L (ref 22–32)
Calcium: 8.1 mg/dL — ABNORMAL LOW (ref 8.9–10.3)
Chloride: 102 mmol/L (ref 101–111)
Creatinine, Ser: 1 mg/dL (ref 0.44–1.00)
GFR calc Af Amer: >60 mL/min (ref >60)
GFR, EST NON AFRICAN AMERICAN: 52 mL/min — AB (ref >60)
Glucose, Bld: 105 mg/dL — ABNORMAL HIGH (ref 65–99)
POTASSIUM: 3.6 mmol/L (ref 3.5–5.1)
SODIUM: 133 mmol/L — AB (ref 135–145)

## 2017-12-20 LAB — CBC
HCT: 23.3 % — ABNORMAL LOW (ref 36.0–46.0)
Hemoglobin: 7.3 g/dL — ABNORMAL LOW (ref 12.0–15.0)
MCH: 26.2 pg (ref 26.0–34.0)
MCHC: 31.3 g/dL (ref 30.0–36.0)
MCV: 83.5 fL (ref 78.0–100.0)
PLATELETS: 395 10*3/uL (ref 150–400)
RBC: 2.79 MIL/uL — AB (ref 3.87–5.11)
RDW: 17.5 % — ABNORMAL HIGH (ref 11.5–15.5)
WBC: 10.3 10*3/uL (ref 4.0–10.5)

## 2017-12-20 MED ORDER — TRAZODONE HCL 50 MG PO TABS
50.0000 mg | ORAL_TABLET | Freq: Once | ORAL | Status: AC
  Administered 2017-12-20: 50 mg via ORAL
  Filled 2017-12-20: qty 1

## 2017-12-20 MED ORDER — WHITE PETROLATUM EX OINT
TOPICAL_OINTMENT | CUTANEOUS | Status: AC
  Administered 2017-12-20: 1
  Filled 2017-12-20: qty 28.35

## 2017-12-20 NOTE — Progress Notes (Signed)
Pt called nurse to room, said that as of now she is worried about her husband and she does not want to have the surgery done at this moment. Pt is requesting to talk to MD. Hulen Skains MDs office, waiting for callback.

## 2017-12-20 NOTE — Plan of Care (Signed)
  Clinical Measurements: Will remain free from infection 12/20/2017 1234 - Progressing by Williams Che, RN   Activity: Risk for activity intolerance will decrease 12/20/2017 1234 - Progressing by Williams Che, RN   Nutrition: Adequate nutrition will be maintained 12/20/2017 1234 - Progressing by Williams Che, RN   Coping: Level of anxiety will decrease 12/20/2017 1234 - Progressing by Williams Che, RN   Elimination: Will not experience complications related to bowel motility 12/20/2017 1234 - Progressing by Williams Che, RN   Pain Managment: General experience of comfort will improve 12/20/2017 1234 - Progressing by Williams Che, RN   Safety: Ability to remain free from injury will improve 12/20/2017 1234 - Progressing by Williams Che, RN   Skin Integrity: Risk for impaired skin integrity will decrease 12/20/2017 1234 - Progressing by Williams Che, RN

## 2017-12-20 NOTE — Progress Notes (Addendum)
   12/20/17 1700  Clinical Encounter Type  Visited With Patient  Visit Type Initial;Psychological support  Referral From Chaplain  Consult/Referral To Chaplain  Spiritual Encounters  Spiritual Needs Emotional  Stress Factors  Patient Stress Factors Exhausted  Family Stress Factors Exhausted   Patient was alone in her room trying to catch some sleep. No family on -site. Patient mentioned that she has neither friend, relatives nor family. She said she was the only support to her husband who came to check on her and fainted and ended up being admitted on 6 E. Patient expressed her concern not agreeing with her foot amputation and willing to  postpon till further notice. I provided emotional support, reflective listening and compassionate presence.  Vikas Wegmann a Medical sales representative, [employer]

## 2017-12-20 NOTE — Progress Notes (Signed)
PROGRESS NOTE  Shelby Reese VOZ:[id number] DOB: 12-14-1934 DOA: 12/12/2017 PCP: [name], MD   LOS: 8 days   Brief Narrative / Interim history: 83 y.o. female with medical history significant for PVD and persistent ulceration of right knee (medially) who underwent limb salvage intervention with debridement, skin graft therapy and wound VAC therapy most recently on 12/05/2017  who presented on 12/12/2017 after evaluation from physician at her SNF with worsening right knee pain and malodorous drainage and was found to be septic secondary to the right knee wound.  Patient was previously evaluated in the ED on 12/09/17 for increased drainage with foul odor.  ED physicians did not visualize wound due to postoperative dressings, lab work and vitals at the time were stable and unremarkable so plans were made for patient to follow-up with her orthopedic surgeon on 1/24.  Dr. Sharol Given evaluated Shelby Reese on 1/24.  At that time patient stated she cannot tolerate continued compression therapy and current wound care.  Initial plan was for patient to undergo an AKA however patient then requested to cancel surgery as she was seeking a second opinion on wound care, and Dr. Oneida Alar was consulted and agreed to see patient in consultation. Patient is now postop s/p right above-knee amputation.  Currently being managed for left leg cellulitis/wounds in the context of suspected PAD for which she is being evaluated by vascular surgery.  12/20/2017-patient seen today.  Available records reviewed.  The patient tells me that her husband is currently in the hospital.  The patient tells me that she is overwhelmed as she has to look after her husband from his sick bed, and also take care of the home situation.  The patient tells me that she will not be able to proceed with the left AKA that is planned for Monday, 12/22/2017.  The patient will discuss this further with the vascular and the orthopedic team.  Otherwise, no chest pain, no  fever or chills, no shortness of breath.  Assessment & Plan: Principal Problem:   Sepsis ([place])    Peripheral artery disease left lower extremity.    Left heel gangrene.     Morbid obesity.     Status post right above-knee amputation. Active Problems:   Iron deficiency anemia   CKD (chronic kidney disease), stage III (HCC)   Wound of right leg, sequela   Sepsis secondary to right knee wound infection -Patient status post multiple irrigation and debridements, and previous wound VAC (removed recently).  Low-grade fever, leukocytosis, and likely source meet criteria for sepsis. - Vascular surgery consulted , now status post right above knee amputation on 1/28, by Dr. Oneida Alar - Completed antibiotics for this indication. - R AKA stump without acute findings. 12/20/2017-as of today, the patient is on IV cefepime, Flagyl and vancomycin.  Acute on chronic anemia, iron deficiency -Hemoglobin on admission 6.7, likely in the setting of chronic disease as well as chronic blood loss from her wound, she received 2 units of packed red blood cells , hemoglobin came up to 9.2, but now trending down and down to 7.2 on 2/1.  No overt bleeding. -Hemoglobin is 7.3 g per DL today. - Will have a very low threshold to transfuse patient with packed red blood cells in the morning if surgery is planned for Monday, 02/02/ 2019.  Hypertension, relative hypotension, likely related to sepsis - Hypertension controlled.  CKD stage III, stable -Baseline creatinine 1.4-1.6.   -Creatinine has normalized.  Mild hyponatremia. Continue to monitor. Continue current management.  Left lower extremity edema/cellulitis Suspect patient has underlying peripheral vascular disease, no history of lymphedema and venous insufficiency now with significant left lower extremity edema and blistering ABI on the left lower extremity-patient unable to tolerate. Diuresed with couple days of IV Lasix with some but not significant  improvement of left lower extremity edema. 2D echo: Normal EF and grade 2 diastolic dysfunction. Remains on empiric IV antibiotics since 1/25: Cefepime, metronidazole and vancomycin.  Vancomycin trough elevated/24 on 2/1, pharmacy managing. Cellulitis features slowly improving. WOC input appreciated. Blood cultures x2: Negative, final report.  MRSA PCR negative. Discussed with Dr. Oneida Alar 1/31> suspect PAD, await arterial Dopplers for further recommendations (pending), continue antibiotics, no surgical intervention had been discussed with patient. -Arterial duplex ultrasound revealed stenosis of the left common femoral artery.  Unfortunately, the patient is not deemed a bypass candidate.  The plan is for the patient to undergo a left above-knee amputation.  This was originally planned for 12/22/2017.  The patient is not telling me that her home situation may not allow this to take place.  The patient will discuss this with the surgical team.  Lab reviewed today: -BMP reveals a sodium of 133, potassium of 3.6, chloride 102, CO2 22, BUN of 16 and creatinine of 1.  Blood sugar was 105.   -CBC reveals WBC of 10.3, hemoglobin of 7.3, hematocrit of 23.3, MCV of 83.5, and a platelet count of 395. -Arterial Doppler ultrasound of the left lower extremity reveals stenosis of the common femoral artery.   DVT prophylaxis: SCDs Code Status: Full code Family Communication: no family at bedside Disposition Plan: DC to SNF pending clinical improvement.  Consultants:   Vascular surgery, Dr. Oneida Alar  Procedures:   As above  Antimicrobials:  Cefepime 1/25 >>  Vancomycin 1/25 >>  IV Flagyl 1/25 >.  Subjective: Patient seen.  No new complaints.  No fever, no chills, no shortness of breath. Patient becomes emotional when talking about the planned left above-knee amputation. Family challenges seem to be when the patient. Patient with this because the date of surgery with the surgical  team.  Objective: Vitals:   12/20/17 0050 12/20/17 0607 12/20/17 0700 12/20/17 1304  BP: (!) 157/54 (!) 171/65 (!) 168/72 (!) 181/56  Pulse: 78 78 80 86  Resp: 16 17  18   Temp: (!) 97.5 F (36.4 C) 98 F (36.7 C) 98.4 F (36.9 C) 98.1 F (36.7 C)  TempSrc: Axillary Oral Oral Oral  SpO2: 97% 98% 100% 98%  Weight:      Height:        Intake/Output Summary (Last 24 hours) at 12/20/2017 1359 Last data filed at 12/20/2017 0354 Gross per 24 hour  Intake 880 ml  Output 350 ml  Net 530 ml   Filed Weights   12/12/17 1442  Weight: 103 kg (227 lb)    Examination:  Constitutional: Obese, not in any distress, awake and alert.  However, the patient becomes emotional while discussing about the upcoming surgery.   Respiratory: Clear to auscultation.   Cardiovascular: S1 and S2 heard.  Abdomen: Obese, soft and nontender.  Normal bowel sounds heard.   Musculoskeletal: Patient is status post right AKA.  The stump is dressed.  Left lower extremity has mild edema.  The heel is also covered. Neurologic: Alert and oriented x2.  No focal neurological deficits.         Data Reviewed: I have independently reviewed following labs and imaging studies   CBC: Recent Labs  Lab 12/16/17 0548  12/17/17 0533 12/18/17 0832 12/19/17 0542 12/20/17 0503  WBC 17.4* 13.9* 13.6* 11.4* 10.3  HGB 8.0* 7.9* 7.8* 7.2* 7.3*  HCT 26.5* 25.4* 26.2* 24.0* 23.3*  MCV 83.9 84.7 84.0 83.6 83.5  PLT 381 391 379 394 742   Basic Metabolic Panel: Recent Labs  Lab 12/16/17 0548 12/17/17 0533 12/18/17 0832 12/19/17 0542 12/20/17 0503  NA 131* 130* 132* 133* 133*  K 4.0 4.0 3.6 3.6 3.6  CL 100* 99* 102 102 102  CO2 21* 21* 20* 23 22  GLUCOSE 155* 97 124* 92 105*  BUN 17 17 20 18 16   CREATININE 1.02* 1.04* 1.16* 1.04* 1.00  CALCIUM 8.1* 8.3* 8.2* 8.1* 8.1*   GFR: Estimated Creatinine Clearance: 51.4 mL/min (by C-G formula based on SCr of 1 mg/dL). Liver Function Tests: Recent Labs  Lab  12/14/17 0612 12/15/17 0716 12/17/17 0533  AST  --  18 21  ALT  --  14 13*  ALKPHOS  --  70 65  BILITOT  --  0.6 0.7  PROT  --  6.3* 5.7*  ALBUMIN 2.2* 2.4* 2.0*   Coagulation Profile: Recent Labs  Lab 12/15/17 0716  INR 1.10   BNP (last 3 results) No results for input(s): PROBNP in the last 8760 hours.  CBG: Recent Labs  Lab 12/13/17 1609  GLUCAP 94   Urine analysis:    Component Value Date/Time   COLORURINE AMBER (A) 12/12/2017 1719   APPEARANCEUR CLEAR 12/12/2017 1719   LABSPEC 1.020 12/12/2017 1719   PHURINE 5.0 12/12/2017 1719   GLUCOSEU NEGATIVE 12/12/2017 1719   HGBUR NEGATIVE 12/12/2017 1719   BILIRUBINUR NEGATIVE 12/12/2017 1719   KETONESUR NEGATIVE 12/12/2017 1719   PROTEINUR NEGATIVE 12/12/2017 1719   NITRITE POSITIVE (A) 12/12/2017 1719   LEUKOCYTESUR MODERATE (A) 12/12/2017 1719     Recent Results (from the past 240 hour(s))  Culture, blood (routine x 2)     Status: None   Collection Time: 12/12/17  3:25 PM  Result Value Ref Range Status   Specimen Description BLOOD RIGHT FOREARM  Final   Special Requests IN PEDIATRIC BOTTLE Blood Culture adequate volume  Final   Culture   Final    NO GROWTH 5 DAYS Performed at [facility], [place] [address] [place], [place] [address]    Report Status 12/17/2017 FINAL  Final  Culture, blood (routine x 2)     Status: None   Collection Time: 12/12/17  3:26 PM  Result Value Ref Range Status   Specimen Description BLOOD LEFT FOREARM  Final   Special Requests   Final    BOTTLES DRAWN AEROBIC AND ANAEROBIC Blood Culture adequate volume   Culture   Final    NO GROWTH 5 DAYS Performed at [facility], [place] [address] [place], [place] [address]    Report Status 12/17/2017 FINAL  Final  Surgical PCR screen     Status: None   Collection Time: 12/15/17 10:15 AM  Result Value Ref Range Status   MRSA, PCR NEGATIVE NEGATIVE Final   Staphylococcus aureus NEGATIVE NEGATIVE Final    Comment: (NOTE) The  Xpert SA Assay (FDA approved for NASAL specimens in patients 36 years of age and older), is one component of a comprehensive surveillance program. It is not intended to diagnose infection nor to guide or monitor treatment.       Radiology Studies: No results found.   Scheduled Meds: . docusate sodium  100 mg Oral Daily  . famotidine  40  mg Oral Q1500  . feeding supplement (ENSURE ENLIVE)  237 mL Oral TID WC  . heparin  5,000 Units Subcutaneous Q8H  . levothyroxine  175 mcg Oral QAC breakfast  . multivitamin with minerals  1 tablet Oral Daily  . nutrition supplement (JUVEN)  1 packet Oral BID BM  . pantoprazole  40 mg Oral Daily  . polyethylene glycol  17 g Oral BID  . senna  2 tablet Oral Daily   Continuous Infusions: . sodium chloride    . ceFEPime (MAXIPIME) IV Stopped (12/20/17 8875)  . metronidazole Stopped (12/20/17 0944)  . vancomycin Stopped (12/20/17 0220)    Bonnell Public, MD. [facility] Pager [phone].  If 7PM-7AM, please contact night-coverage [email or web address] Password [facility] 12/20/2017, 1:59 PM

## 2017-12-21 LAB — PREPARE RBC (CROSSMATCH)

## 2017-12-21 MED ORDER — HYDRALAZINE HCL 20 MG/ML IJ SOLN
10.0000 mg | INTRAMUSCULAR | Status: DC | PRN
  Administered 2017-12-21 – 2017-12-22 (×2): 10 mg via INTRAVENOUS
  Filled 2017-12-21 (×2): qty 1

## 2017-12-21 MED ORDER — CYCLOBENZAPRINE HCL 10 MG PO TABS
5.0000 mg | ORAL_TABLET | Freq: Once | ORAL | Status: AC
  Administered 2017-12-21: 5 mg via ORAL
  Filled 2017-12-21: qty 1

## 2017-12-21 MED ORDER — FUROSEMIDE 10 MG/ML IJ SOLN
INTRAMUSCULAR | Status: AC
  Administered 2017-12-21: 17:00:00
  Filled 2017-12-21: qty 2

## 2017-12-21 MED ORDER — SODIUM CHLORIDE 0.9 % IV SOLN
Freq: Once | INTRAVENOUS | Status: AC
  Administered 2017-12-21: 16:00:00 via INTRAVENOUS

## 2017-12-21 MED ORDER — ZOLPIDEM TARTRATE 5 MG PO TABS
5.0000 mg | ORAL_TABLET | Freq: Once | ORAL | Status: AC
  Administered 2017-12-21: 5 mg via ORAL
  Filled 2017-12-21: qty 1

## 2017-12-21 NOTE — Progress Notes (Signed)
Patient ID: Shelby Reese, female   DOB: [birth year], 83 y.o.   MRN: [id number] Patient does not want to proceed with amputation as planned for Monday.  Apparently her husband had a syncopal episode while visiting with her and is now admitted to 6 E.  Will cancel surgery for Monday as this situation resolves and schedule when she is willing to proceed.  Will notify Dr. Oneida Alar.

## 2017-12-21 NOTE — Progress Notes (Signed)
PROGRESS NOTE  Shelby Reese IWP:[id number] DOB: 1935-02-11 DOA: 12/12/2017 PCP: [name], MD   LOS: 9 days   Brief Narrative / Interim history: 83 y.o. female with medical history significant for PVD and persistent ulceration of right knee (medially) who underwent limb salvage intervention with debridement, skin graft therapy and wound VAC therapy most recently on 12/05/2017  who presented on 12/12/2017 after evaluation from physician at her SNF with worsening right knee pain and malodorous drainage and was found to be septic secondary to the right knee wound.  Patient was previously evaluated in the ED on 12/09/17 for increased drainage with foul odor.  ED physicians did not visualize wound due to postoperative dressings, lab work and vitals at the time were stable and unremarkable so plans were made for patient to follow-up with her orthopedic surgeon on 1/24.  Dr. Sharol Given evaluated Shelby Reese on 1/24.  At that time patient stated she cannot tolerate continued compression therapy and current wound care.  Initial plan was for patient to undergo an AKA however patient then requested to cancel surgery as she was seeking a second opinion on wound care, and Dr. Oneida Alar was consulted and agreed to see patient in consultation. Patient is now postop s/p right above-knee amputation.  Currently being managed for left leg cellulitis/wounds in the context of suspected PAD for which she is being evaluated by vascular surgery.  12/21/2017-patient seen today.  No new changes.  Patient is still saying that she would not want to proceed with the surgery tomorrow.  Hopefully, the surgical team will discuss further with the patient.  I will go ahead and transfuse patient with 2 units of packed red blood cells in case the surgery takes place tomorrow.  The patient's hemoglobin is around 7.  The patient has peripheral artery disease, and will be going for possible above-knee amputation of the left lower extremity tomorrow, that  is, if patient consents to surgery.  Assessment & Plan: Principal Problem:   Sepsis ([place])    Peripheral artery disease left lower extremity.    Left heel gangrene.     Morbid obesity.     Status post right above-knee amputation. Active Problems:   Iron deficiency anemia   CKD (chronic kidney disease), stage III (HCC)   Wound of right leg, sequela   Sepsis secondary to right knee wound infection -Patient status post multiple irrigation and debridements, and previous wound VAC (removed recently).  Low-grade fever, leukocytosis, and likely source meet criteria for sepsis. - Vascular surgery consulted , now status post right above knee amputation on 1/28, by Dr. Oneida Alar - Completed antibiotics for this indication. - R AKA stump without acute findings. 12/21/2017 - Antibiotics visualized are IV vancomycin and IV Flagyl.    Acute on chronic anemia, iron deficiency -Hemoglobin on admission 6.7, likely in the setting of chronic disease as well as chronic blood loss from her wound, she received 2 units of packed red blood cells , hemoglobin came up to 9.2, but now trending down and down to 7.2 on 2/1.  No overt bleeding. -Last hemoglobin was 7.3 g/dL.   -We will transfuse patient with 2 units of packed red blood cells.  Will give IV Lasix 20 mg before each unit of red blood cells. - Hypertension, relative hypotension, likely related to sepsis - Hypertension controlled.  CKD stage III, stable -Baseline creatinine 1.4-1.6.   -Creatinine is 1 today.   Mild hyponatremia. Continue to monitor. Continue current management.  Left lower extremity edema/cellulitis -  Arterial vascular ultrasound of the left lower extremity reveals stenosis of the common femoral artery. - ABI on the left lower extremity-patient unable to tolerate. - 2D echo: Normal EF and grade 2 diastolic dysfunction. - Remains on empiric IV antibiotics since 1/25: Cefepime, metronidazole and vancomycin.  Vancomycin trough  elevated/24 on 2/1, pharmacy managing. Cellulitis features slowly improving. WOC input appreciated. Blood cultures x2: Negative, final report.  MRSA PCR negative. -Arterial duplex ultrasound revealed stenosis of the left common femoral artery.  Unfortunately, the patient is not deemed a bypass candidate.  The plan is for the patient to undergo a left above-knee amputation on 12/22/2017.  The patient is saying that her home situation may not allow the surgery to take place.  The patient will discuss this with the surgical team.  Lab reviewed today: No new labs today.  Will check renal panel and CBC in the morning.  DVT prophylaxis: SCDs Code Status: Full code Family Communication: no family at bedside Disposition Plan: DC to SNF pending clinical improvement.  Consultants:   Vascular surgery, Dr. Oneida Alar  Procedures:   As above  Antimicrobials:  Cefepime 1/25 >>  Vancomycin 1/25 >>  IV Flagyl 1/25 >.  Subjective: Patient seen.  No new complaints.  No fever, no chills, no shortness of breath. Patient becomes emotional when discussing about the planned left above-knee amputation.  Objective: Vitals:   12/20/17 1304 12/20/17 2118 12/21/17 0404 12/21/17 1300  BP: (!) 181/56 (!) 181/59 (!) 180/60 (!) 163/64  Pulse: 86 86 80 86  Resp: 18 18 16 16   Temp: 98.1 F (36.7 C) 98.4 F (36.9 C) 98.1 F (36.7 C) 98.4 F (36.9 C)  TempSrc: Oral Oral Oral Oral  SpO2: 98% 98% 98% 100%  Weight:      Height:        Intake/Output Summary (Last 24 hours) at 12/21/2017 1552 Last data filed at 12/21/2017 1300 Gross per 24 hour  Intake 360 ml  Output 1100 ml  Net -740 ml   Filed Weights   12/12/17 1442  Weight: 103 kg (227 lb)    Examination:  Constitutional: Obese, not in any distress, awake and alert.  Respiratory: Clear to auscultation.   Cardiovascular: S1 and S2 heard.  Abdomen: Obese, soft and nontender.  Normal bowel sounds heard.   Musculoskeletal: Patient is status post  right AKA.  The stump is dressed.  Left lower extremity has mild edema.  The heel is also covered. Neurologic: Alert and oriented x2.  No focal neurological deficits.         Data Reviewed: I have independently reviewed following labs and imaging studies   CBC: Recent Labs  Lab 12/16/17 0548 12/17/17 0533 12/18/17 0832 12/19/17 0542 12/20/17 0503  WBC 17.4* 13.9* 13.6* 11.4* 10.3  HGB 8.0* 7.9* 7.8* 7.2* 7.3*  HCT 26.5* 25.4* 26.2* 24.0* 23.3*  MCV 83.9 84.7 84.0 83.6 83.5  PLT 381 391 379 394 081   Basic Metabolic Panel: Recent Labs  Lab 12/16/17 0548 12/17/17 0533 12/18/17 0832 12/19/17 0542 12/20/17 0503  NA 131* 130* 132* 133* 133*  K 4.0 4.0 3.6 3.6 3.6  CL 100* 99* 102 102 102  CO2 21* 21* 20* 23 22  GLUCOSE 155* 97 124* 92 105*  BUN 17 17 20 18 16   CREATININE 1.02* 1.04* 1.16* 1.04* 1.00  CALCIUM 8.1* 8.3* 8.2* 8.1* 8.1*   GFR: Estimated Creatinine Clearance: 51.4 mL/min (by C-G formula based on SCr of 1 mg/dL). Liver Function Tests: Recent Labs  Lab 12/15/17 0716 12/17/17 0533  AST 18 21  ALT 14 13*  ALKPHOS 70 65  BILITOT 0.6 0.7  PROT 6.3* 5.7*  ALBUMIN 2.4* 2.0*   Coagulation Profile: Recent Labs  Lab 12/15/17 0716  INR 1.10   BNP (last 3 results) No results for input(s): PROBNP in the last 8760 hours.  CBG: No results for input(s): GLUCAP in the last 168 hours. Urine analysis:    Component Value Date/Time   COLORURINE AMBER (A) 12/12/2017 1719   APPEARANCEUR CLEAR 12/12/2017 1719   LABSPEC 1.020 12/12/2017 1719   PHURINE 5.0 12/12/2017 1719   GLUCOSEU NEGATIVE 12/12/2017 1719   HGBUR NEGATIVE 12/12/2017 1719   BILIRUBINUR NEGATIVE 12/12/2017 1719   KETONESUR NEGATIVE 12/12/2017 1719   PROTEINUR NEGATIVE 12/12/2017 1719   NITRITE POSITIVE (A) 12/12/2017 1719   LEUKOCYTESUR MODERATE (A) 12/12/2017 1719     Recent Results (from the past 240 hour(s))  Culture, blood (routine x 2)     Status: None   Collection Time:  12/12/17  3:25 PM  Result Value Ref Range Status   Specimen Description BLOOD RIGHT FOREARM  Final   Special Requests IN PEDIATRIC BOTTLE Blood Culture adequate volume  Final   Culture   Final    NO GROWTH 5 DAYS Performed at [facility], [place] [address], [place], [place] [address]    Report Status 12/17/2017 FINAL  Final  Culture, blood (routine x 2)     Status: None   Collection Time: 12/12/17  3:26 PM  Result Value Ref Range Status   Specimen Description BLOOD LEFT FOREARM  Final   Special Requests   Final    BOTTLES DRAWN AEROBIC AND ANAEROBIC Blood Culture adequate volume   Culture   Final    NO GROWTH 5 DAYS Performed at [facility], [place] [address] [place], [place] [address]    Report Status 12/17/2017 FINAL  Final  Surgical PCR screen     Status: None   Collection Time: 12/15/17 10:15 AM  Result Value Ref Range Status   MRSA, PCR NEGATIVE NEGATIVE Final   Staphylococcus aureus NEGATIVE NEGATIVE Final    Comment: (NOTE) The Xpert SA Assay (FDA approved for NASAL specimens in patients 65 years of age and older), is one component of a comprehensive surveillance program. It is not intended to diagnose infection nor to guide or monitor treatment.       Radiology Studies: No results found.   Scheduled Meds: . docusate sodium  100 mg Oral Daily  . famotidine  40 mg Oral Q1500  . feeding supplement (ENSURE ENLIVE)  237 mL Oral TID WC  . heparin  5,000 Units Subcutaneous Q8H  . levothyroxine  175 mcg Oral QAC breakfast  . multivitamin with minerals  1 tablet Oral Daily  . nutrition supplement (JUVEN)  1 packet Oral BID BM  . pantoprazole  40 mg Oral Daily  . polyethylene glycol  17 g Oral BID  . senna  2 tablet Oral Daily   Continuous Infusions: . sodium chloride    . sodium chloride    . ceFEPime (MAXIPIME) IV Stopped (12/21/17 0130)  . metronidazole 500 mg (12/21/17 1542)  . vancomycin Stopped (12/20/17 0220)    Bonnell Public, MD. [facility] Pager [phone].  If 7PM-7AM, please contact night-coverage [email or web address] Password [email or web address] 12/21/2017, 3:52 PM

## 2017-12-21 NOTE — Plan of Care (Signed)
  Progressing Education: Knowledge of General Education information will improve 12/21/2017 1328 - Progressing by Rance Muir, RN Health Behavior/Discharge Planning: Ability to manage health-related needs will improve 12/21/2017 1328 - Progressing by Rance Muir, RN Clinical Measurements: Ability to maintain clinical measurements within normal limits will improve 12/21/2017 1328 - Progressing by Rance Muir, RN Will remain free from infection 12/21/2017 1328 - Progressing by Rance Muir, RN Diagnostic test results will improve 12/21/2017 1328 - Progressing by Rance Muir, RN Respiratory complications will improve 12/21/2017 1328 - Progressing by Rance Muir, RN Cardiovascular complication will be avoided 12/21/2017 1328 - Progressing by Rance Muir, RN Activity: Risk for activity intolerance will decrease 12/21/2017 1328 - Progressing by Rance Muir, RN Nutrition: Adequate nutrition will be maintained 12/21/2017 1328 - Progressing by Rance Muir, RN Coping: Level of anxiety will decrease 12/21/2017 1328 - Progressing by Rance Muir, RN Elimination: Will not experience complications related to bowel motility 12/21/2017 1328 - Progressing by Rance Muir, RN Will not experience complications related to urinary retention 12/21/2017 1328 - Progressing by Rance Muir, RN Pain Managment: General experience of comfort will improve 12/21/2017 1328 - Progressing by Rance Muir, RN Safety: Ability to remain free from injury will improve 12/21/2017 1328 - Progressing by Rance Muir, RN Skin Integrity: Risk for impaired skin integrity will decrease 12/21/2017 1328 - Progressing by Rance Muir, RN Education: Knowledge of the prescribed therapeutic regimen will improve 12/21/2017 1328 - Progressing by Rance Muir, RN Ability to verbalize activity precautions or restrictions will improve 12/21/2017 1328 - Progressing by Rance Muir, RN Understanding of discharge needs will improve 12/21/2017 1328 - Progressing by Rance Muir, RN Activity: Ability to  perform//tolerate increased activity and mobilize with assistive devices will improve 12/21/2017 1328 - Progressing by Rance Muir, RN Clinical Measurements: Postoperative complications will be avoided or minimized 12/21/2017 1328 - Progressing by Rance Muir, RN Self-Care: Ability to meet self-care needs will improve 12/21/2017 1328 - Progressing by Rance Muir, RN Self-Concept: Ability to maintain and perform role responsibilities to the fullest extent possible will improve 12/21/2017 1328 - Progressing by Rance Muir, RN Pain Management: Pain level will decrease with appropriate interventions 12/21/2017 1328 - Progressing by Rance Muir, RN Skin Integrity: Demonstration of wound healing without infection will improve 12/21/2017 1328 - Progressing by Rance Muir, RN

## 2017-12-22 ENCOUNTER — Encounter (HOSPITAL_COMMUNITY)
Admission: EM | Disposition: A | Discharge status: Skilled Nursing Facility | Payer: Self-pay | Source: Home / Self Care | Attending: Internal Medicine

## 2017-12-22 DIAGNOSIS — R262 Difficulty in walking, not elsewhere classified: Secondary | ICD-10-CM

## 2017-12-22 LAB — GLUCOSE, CAPILLARY: Glucose-Capillary: 105 mg/dL — ABNORMAL HIGH (ref 65–99)

## 2017-12-22 LAB — CBC WITH DIFFERENTIAL/PLATELET
Basophils Absolute: 0 10*3/uL (ref 0.0–0.1)
Basophils Relative: 0 %
Eosinophils Absolute: 0.5 10*3/uL (ref 0.0–0.7)
Eosinophils Relative: 4 %
HCT: 33.6 % — ABNORMAL LOW (ref 36.0–46.0)
Hemoglobin: 10.5 g/dL — ABNORMAL LOW (ref 12.0–15.0)
Lymphocytes Relative: 9 %
Lymphs Abs: 1.3 10*3/uL (ref 0.7–4.0)
MCH: 25.6 pg — ABNORMAL LOW (ref 26.0–34.0)
MCHC: 31.3 g/dL (ref 30.0–36.0)
MCV: 82 fL (ref 78.0–100.0)
Monocytes Absolute: 1.2 10*3/uL — ABNORMAL HIGH (ref 0.1–1.0)
Monocytes Relative: 9 %
Neutro Abs: 10.5 10*3/uL — ABNORMAL HIGH (ref 1.7–7.7)
Neutrophils Relative %: 78 %
Platelets: 505 10*3/uL — ABNORMAL HIGH (ref 150–400)
RBC: 4.1 MIL/uL (ref 3.87–5.11)
RDW: 17.1 % — ABNORMAL HIGH (ref 11.5–15.5)
WBC: 13.5 10*3/uL — ABNORMAL HIGH (ref 4.0–10.5)

## 2017-12-22 LAB — RENAL FUNCTION PANEL
Albumin: 2 g/dL — ABNORMAL LOW (ref 3.5–5.0)
Anion gap: 14 (ref 5–15)
BUN: 10 mg/dL (ref 6–20)
CO2: 23 mmol/L (ref 22–32)
Calcium: 8.6 mg/dL — ABNORMAL LOW (ref 8.9–10.3)
Chloride: 99 mmol/L — ABNORMAL LOW (ref 101–111)
Creatinine, Ser: 0.93 mg/dL (ref 0.44–1.00)
GFR calc Af Amer: >60 mL/min (ref >60)
GFR calc non Af Amer: 57 mL/min — ABNORMAL LOW (ref >60)
Glucose, Bld: 85 mg/dL (ref 65–99)
Phosphorus: 2.8 mg/dL (ref 2.5–4.6)
Potassium: 3.3 mmol/L — ABNORMAL LOW (ref 3.5–5.1)
Sodium: 136 mmol/L (ref 135–145)

## 2017-12-22 LAB — PREPARE RBC (CROSSMATCH)

## 2017-12-22 SURGERY — AMPUTATION, ABOVE KNEE
Anesthesia: Choice | Laterality: Left

## 2017-12-22 NOTE — Progress Notes (Signed)
PT Cancellation Note  Patient Details Name: Shelby Reese MRN: [id number] DOB: [birth year]   Cancelled Treatment:    Reason Eval/Treat Not Completed: Patient declined, no reason specified. Pt reports that she cannot work with PT at this time although would like to get out of the bed. PT encouraged pt to attempt OOB to the recliner with the maxi-move, however pt states that she cannot risk anything to go "wrong" at this point as she is "the only one who can make the necessary arrangements" for her family. Will check back as schedule allows to continue with PT POC.    Thelma Comp 12/22/2017, 11:54 AM   Rolinda Roan, PT, DPT Acute Rehabilitation Services Pager: [phone]

## 2017-12-22 NOTE — Progress Notes (Signed)
Pharmacy Antibiotic Note  Shelby Reese is a 83 y.o. female now s/p R AKA on 1/28. Pharmacy has been consulted for vancomycin and cefepime dosing for wound infection. Also on Flagyl per MD. Patient's renal fx continues to improve. UOP increased to 0.9 mL/kg/hr. WBC up to 13.5. SCr stable.   Plan: Continue vanc to 1250mg  IV Q48H. Vanc trough prior to next dose Cefepime 2gm IV Q24H Flagyl 500mg  IV Q8H per MD Monitor renal fxn, repeat vanc trough as indicated   Height: 5\' 3"  (160 cm) Weight: 227 lb (103 kg) IBW/kg (Calculated) : 52.4  Temp (24hrs), Avg:98.2 F (36.8 C), Min:97.5 F (36.4 C), Max:98.6 F (37 C)  Recent Labs  Lab 12/17/17 0533 12/18/17 0832 12/19/17 0542 12/19/17 1026 12/20/17 0503 12/22/17 0843  WBC 13.9* 13.6* 11.4*  --  10.3 13.5*  CREATININE 1.04* 1.16* 1.04*  --  1.00 0.93  VANCOTROUGH  --   --   --  24*  --   --     Estimated Creatinine Clearance: 55.3 mL/min (by C-G formula based on SCr of 0.93 mg/dL).    Allergies  Allergen Reactions  . Cinnamon Hives  . Ciprofloxacin Other (See Comments)    TREMORS  . Diovan [Valsartan] Other (See Comments)    Extreme vertigo  . Food Diarrhea and Other (See Comments)    ORANGE JUICE   UPSET STOMACH  . Latex Rash and Other (See Comments)    Rash/inflammation due to exposure  . Nitrofuran Derivatives Hives and Rash    "Full body rash"  . Penicillins Hives and Swelling    *tolerated Ceftriaxone September 2018 Has patient had a PCN reaction causing immediate rash, facial/tongue/throat swelling, SOB or lightheadedness with hypotension:No--severe irritation at the injection site Has patient had a PCN reaction causing severe rash involving mucus membranes or skin necrosis:Unknown Has patient had a PCN reaction that required hospitalization:No Has patient had a PCN reaction occurring within the last 10 years:Yes If all of the above answers are "NO", then may proceed with  . Bactrim [Sulfamethoxazole-Trimethoprim]  Diarrhea and Nausea Only  . Other Rash    Mycins, Strawberry, Oranges  . Sulfa Antibiotics Diarrhea and Nausea Only     Vanc 1/25 >> Cefepime 1/25 >> Flagyl 1/25 >>  2/1 VT = 24 mcg/mL on 1250mg  q24 >> 1250mg  q48 (SCr 1.04)  1/25 BCx - negative   Albertina Parr, PharmD., BCPS Clinical Pharmacist Pager [phone]

## 2017-12-22 NOTE — Progress Notes (Signed)
Pt currently wants to defer left AKA due to some social issues  Please feel free to reconsult if she wishes to proceed during this admission.  Would continue current wound care  Ruta Hinds, MD [facility] Office: [phone] Pager: [phone]

## 2017-12-22 NOTE — Progress Notes (Signed)
PROGRESS NOTE  Shelby Reese MWU:[id number] DOB: [birth year] DOA: 12/12/2017 PCP: [name], Shelby Reese   LOS: 10 days   Brief Narrative / Interim history: 83 y.o. female with medical history significant for PVD and persistent ulceration of right knee (medially) who underwent limb salvage intervention with debridement, skin graft therapy and wound VAC therapy most recently on 12/05/2017  who presented on 12/12/2017 after evaluation from physician at her SNF with worsening right knee pain and malodorous drainage and was found to be septic secondary to the right knee wound.  Patient was previously evaluated in the ED on 12/09/17 for increased drainage with foul odor.  ED physicians did not visualize wound due to postoperative dressings, lab work and vitals at the time were stable and unremarkable so plans were made for patient to follow-up with her orthopedic surgeon on 1/24.  Dr. Sharol Given evaluated [name] on 1/24.  At that time patient stated she cannot tolerate continued compression therapy and current wound care.  Initial plan was for patient to undergo an AKA however patient then requested to cancel surgery as she was seeking a second opinion on wound care, and Dr. Oneida Alar was consulted and agreed to see patient in consultation. Patient is now postop s/p right above-knee amputation.  Currently being managed for left leg cellulitis/wounds in the context of suspected PAD for which she is being evaluated by vascular surgery.  12/22/2017- patient changed her mind about having surgery on left lower extremity today 12/22/17. Has no pain on that limb. Reports pain in her right stump which is 7/10 and improved after pain medications. No other new complaints.   Assessment & Plan: Principal Problem:   Sepsis ([place])    Peripheral artery disease left lower extremity.    Left heel gangrene.     Morbid obesity.     Status post right above-knee amputation. Active Problems:   Iron deficiency anemia   CKD (chronic kidney  disease), stage III (HCC)   Wound of right leg, sequela   Sepsis secondary to right knee wound infection -Patient status post multiple irrigation and debridements, and previous wound VAC (removed recently).  Low-grade fever, leukocytosis, and likely source meet criteria for sepsis. - Vascular surgery consulted and following , now status post right above knee amputation on 1/28, by Dr. Oneida Alar - Completed antibiotics for this indication. - R AKA stump without acute findings. - 12/21/2017 - Antibiotics are IV vancomycin, IV cefepime and IV Flagyl   Acute on chronic anemia, iron deficiency -Hemoglobin on admission 6.7, likely in the setting of chronic disease as well as chronic blood loss from her wound, she received 2 units of packed red blood cells , hemoglobin came up to 9.2, but now trending down and down to 7.2 on 2/1.  No overt bleeding. -Last hemoglobin was 7.3 g/dL.   -We will transfuse patient with 2 units of packed red blood cells.  Will give IV Lasix 20 mg before each unit of red blood cells.  Hypertension, relative hypotension, likely related to sepsis - Hypertension controlled.  CKD stage III, stable -Baseline creatinine 1.4-1.6.   -Creatinine is 0.93  Mild hyponatremia, resolved. Na+ 136 Continue to monitor. Continue current management.  Hypokalemia -K+ 3.3 -repleted   Left lower extremity edema/cellulitis -Arterial vascular ultrasound of the left lower extremity reveals stenosis of the common femoral artery. - ABI on the left lower extremity-patient unable to tolerate. - 2D echo: Normal EF and grade 2 diastolic dysfunction. - Remains on empiric IV antibiotics since 1/25:  Cefepime, metronidazole and vancomycin.  Vancomycin trough elevated/24 on 2/1, pharmacy managing. Cellulitis features slowly improving. WOC input appreciated. Blood cultures x2: Negative, final report.  MRSA PCR negative. -Arterial duplex ultrasound revealed stenosis of the left common femoral  artery.  Unfortunately, the patient is not deemed a bypass candidate.  The plan is for the patient to undergo a left above-knee amputation on 12/22/2017 which she declined.   -The patient is saying that her home situation may not allow the surgery to take place. States her husband is also here in the hospital at this time.   Ambulatory dysfunction -Fall precaution -PT/OT when more stable  Lab reviewed today: No new labs today.  Will check renal panel and CBC in the morning.  DVT prophylaxis: SCDs Code Status: Full code Family Communication: no family at bedside Disposition Plan: DC to SNF pending clinical improvement.  Consultants:   Vascular surgery, Dr. Oneida Alar  Procedures:   As above  Antimicrobials:  Cefepime 1/25 >>  Vancomycin 1/25 >>  IV Flagyl 1/25 >.   Objective: Vitals:   12/21/17 2243 12/21/17 2318 12/22/17 0104 12/22/17 0424  BP: (!) 165/57 (!) 152/85 (!) 162/56 (!) 158/54  Pulse: 99 94 91 99  Resp: 20 20 18 18   Temp: 98.1 F (36.7 C) 98.6 F (37 C) 98.1 F (36.7 C) (!) 97.5 F (36.4 C)  TempSrc: Oral Oral Oral Oral  SpO2: 98% 97%  99%  Weight:      Height:        Intake/Output Summary (Last 24 hours) at 12/22/2017 0759 Last data filed at 12/22/2017 0424 Gross per 24 hour  Intake 1905 ml  Output 2150 ml  Net -245 ml   Filed Weights   12/12/17 1442  Weight: 103 kg (227 lb)    Examination: 12/22/17 pat seen an dexamined  Constitutional: Obese, not in any distress, awake and alert.  Respiratory: Clear to auscultation.   Cardiovascular: S1 and S2 heard.  Abdomen: Obese, soft and nontender.  Normal bowel sounds heard.   Musculoskeletal: Patient is status post right AKA.  The stump is dressed.  Left lower extremity has mild edema.  The heel is also covered. Neurologic: Alert and oriented x2.  No focal neurological deficits.         Data Reviewed: I have independently reviewed following labs and imaging studies   CBC: Recent Labs  Lab  2017/12/25 0548 12/17/17 0533 12/18/17 0832 12/19/17 0542 12/20/17 0503  WBC 17.4* 13.9* 13.6* 11.4* 10.3  HGB 8.0* 7.9* 7.8* 7.2* 7.3*  HCT 26.5* 25.4* 26.2* 24.0* 23.3*  MCV 83.9 84.7 84.0 83.6 83.5  PLT 381 391 379 394 892   Basic Metabolic Panel: Recent Labs  Lab 2017/12/25 0548 12/17/17 0533 12/18/17 0832 12/19/17 0542 12/20/17 0503  NA 131* 130* 132* 133* 133*  K 4.0 4.0 3.6 3.6 3.6  CL 100* 99* 102 102 102  CO2 21* 21* 20* 23 22  GLUCOSE 155* 97 124* 92 105*  BUN 17 17 20 18 16   CREATININE 1.02* 1.04* 1.16* 1.04* 1.00  CALCIUM 8.1* 8.3* 8.2* 8.1* 8.1*   GFR: Estimated Creatinine Clearance: 51.4 mL/min (by C-G formula based on SCr of 1 mg/dL). Liver Function Tests: Recent Labs  Lab 12/17/17 0533  AST 21  ALT 13*  ALKPHOS 65  BILITOT 0.7  PROT 5.7*  ALBUMIN 2.0*   Coagulation Profile: No results for input(s): INR, PROTIME in the last 168 hours. BNP (last 3 results) No results for input(s): PROBNP in the  last 8760 hours.  CBG: No results for input(s): GLUCAP in the last 168 hours. Urine analysis:    Component Value Date/Time   COLORURINE AMBER (A) 12/12/2017 1719   APPEARANCEUR CLEAR 12/12/2017 1719   LABSPEC 1.020 12/12/2017 1719   PHURINE 5.0 12/12/2017 1719   GLUCOSEU NEGATIVE 12/12/2017 1719   HGBUR NEGATIVE 12/12/2017 1719   BILIRUBINUR NEGATIVE 12/12/2017 1719   KETONESUR NEGATIVE 12/12/2017 1719   PROTEINUR NEGATIVE 12/12/2017 1719   NITRITE POSITIVE (A) 12/12/2017 1719   LEUKOCYTESUR MODERATE (A) 12/12/2017 1719     Recent Results (from the past 240 hour(s))  Culture, blood (routine x 2)     Status: None   Collection Time: 12/12/17  3:25 PM  Result Value Ref Range Status   Specimen Description BLOOD RIGHT FOREARM  Final   Special Requests IN PEDIATRIC BOTTLE Blood Culture adequate volume  Final   Culture   Final    NO GROWTH 5 DAYS Performed at [facility], [place] [address] [place], [place] [address]    Report Status 12/17/2017  FINAL  Final  Culture, blood (routine x 2)     Status: None   Collection Time: 12/12/17  3:26 PM  Result Value Ref Range Status   Specimen Description BLOOD LEFT FOREARM  Final   Special Requests   Final    BOTTLES DRAWN AEROBIC AND ANAEROBIC Blood Culture adequate volume   Culture   Final    NO GROWTH 5 DAYS Performed at [facility], [place] [address], [place], [place] [address]    Report Status 12/17/2017 FINAL  Final  Surgical PCR screen     Status: None   Collection Time: 12/15/17 10:15 AM  Result Value Ref Range Status   MRSA, PCR NEGATIVE NEGATIVE Final   Staphylococcus aureus NEGATIVE NEGATIVE Final    Comment: (NOTE) The Xpert SA Assay (FDA approved for NASAL specimens in patients 50 years of age and older), is one component of a comprehensive surveillance program. It is not intended to diagnose infection nor to guide or monitor treatment.       Radiology Studies: No results found.   Scheduled Meds: . docusate sodium  100 mg Oral Daily  . famotidine  40 mg Oral Q1500  . feeding supplement (ENSURE ENLIVE)  237 mL Oral TID WC  . heparin  5,000 Units Subcutaneous Q8H  . levothyroxine  175 mcg Oral QAC breakfast  . multivitamin with minerals  1 tablet Oral Daily  . nutrition supplement (JUVEN)  1 packet Oral BID BM  . pantoprazole  40 mg Oral Daily  . polyethylene glycol  17 g Oral BID  . senna  2 tablet Oral Daily   Continuous Infusions: . sodium chloride    . ceFEPime (MAXIPIME) IV Stopped (12/22/17 0244)  . metronidazole Stopped (12/22/17 0022)  . vancomycin Stopped (12/22/17 0159)    Shelby Memos, Shelby Reese. [facility] Pager [phone].  If 7PM-7AM, please contact night-coverage [email or web address] Password [email or web address] 12/22/2017, 7:59 AM

## 2017-12-22 NOTE — Progress Notes (Signed)
Follow up visit from earlier visit.  Patient was very appreciative of visit. She is still carrying a lot of stress and feels it throughout her body.  She has made a decision to name her sister as her health care power of attorney who is in another state.  This helps her have some peace.  She has cancelled the leg amputation mostly due to her husband passing out recently and being a patient at [facility].  She is happy for the way the staff on his floor call her everyday and give her a report of how he is doing. That is very meaningful for her and helps her since she cannot go and be with him.  Tender loving care is greatly appreciated during this difficult time. Had prayer and offered pastoral presence, encouragement and support. Conard Novak, Chaplain   12/22/17 1200  Clinical Encounter Type  Visited With Patient  Visit Type Follow-up;Psychological support;Spiritual support  Referral From Chaplain  Consult/Referral To Chaplain  Spiritual Encounters  Spiritual Needs Prayer;Emotional  Stress Factors  Patient Stress Factors Exhausted  Family Stress Factors Exhausted

## 2017-12-22 NOTE — Progress Notes (Signed)
   12/22/17 2200  Clinical Encounter Type  Visited With Patient;Health care provider  Visit Type Initial;Social support  Referral From Nurse  Consult/Referral To [place] was called to aid in the completion of an AD on the Pt behalf; however chaplain wasn't able to locate a notary after hours/ during night Shift. Chaplain will ask day-time chaplain staff to finish up the paperwork (a notary will be in house in the morning)   Pt expressed anxiety about not having her sister as her emergency contact. Medical staff has added new contract information in Pt's chart.   Chaplain spent time talking about anxiety management with Pt however, Pt was more focused on receiving Pain meds than talking. It is my assessment that the Pt may be experiencing anxiety which is why she is unable to sleep.   Emergency Contact information according to Pt:  Name: [name]  Addresses: [place] Home Phone: [phone] Cell Phone: [phone] Relation: Sister of Pt.

## 2017-12-23 DIAGNOSIS — Z7189 Other specified counseling: Secondary | ICD-10-CM | POA: Diagnosis not present

## 2017-12-23 DIAGNOSIS — Z9181 History of falling: Secondary | ICD-10-CM | POA: Diagnosis not present

## 2017-12-23 DIAGNOSIS — T8149XA Infection following a procedure, other surgical site, initial encounter: Secondary | ICD-10-CM | POA: Diagnosis not present

## 2017-12-23 DIAGNOSIS — T148XXA Other injury of unspecified body region, initial encounter: Secondary | ICD-10-CM | POA: Diagnosis not present

## 2017-12-23 DIAGNOSIS — T783XXA Angioneurotic edema, initial encounter: Secondary | ICD-10-CM | POA: Diagnosis not present

## 2017-12-23 DIAGNOSIS — K5903 Drug induced constipation: Secondary | ICD-10-CM | POA: Diagnosis not present

## 2017-12-23 DIAGNOSIS — D508 Other iron deficiency anemias: Secondary | ICD-10-CM | POA: Diagnosis not present

## 2017-12-23 DIAGNOSIS — A498 Other bacterial infections of unspecified site: Secondary | ICD-10-CM | POA: Diagnosis not present

## 2017-12-23 DIAGNOSIS — I739 Peripheral vascular disease, unspecified: Secondary | ICD-10-CM | POA: Diagnosis not present

## 2017-12-23 DIAGNOSIS — Z89611 Acquired absence of right leg above knee: Secondary | ICD-10-CM | POA: Diagnosis not present

## 2017-12-23 DIAGNOSIS — L03115 Cellulitis of right lower limb: Secondary | ICD-10-CM

## 2017-12-23 DIAGNOSIS — K219 Gastro-esophageal reflux disease without esophagitis: Secondary | ICD-10-CM | POA: Diagnosis not present

## 2017-12-23 DIAGNOSIS — M6281 Muscle weakness (generalized): Secondary | ICD-10-CM | POA: Diagnosis not present

## 2017-12-23 DIAGNOSIS — A419 Sepsis, unspecified organism: Secondary | ICD-10-CM | POA: Diagnosis not present

## 2017-12-23 DIAGNOSIS — F329 Major depressive disorder, single episode, unspecified: Secondary | ICD-10-CM | POA: Diagnosis not present

## 2017-12-23 DIAGNOSIS — I10 Essential (primary) hypertension: Secondary | ICD-10-CM | POA: Diagnosis not present

## 2017-12-23 DIAGNOSIS — D62 Acute posthemorrhagic anemia: Secondary | ICD-10-CM | POA: Diagnosis not present

## 2017-12-23 DIAGNOSIS — R52 Pain, unspecified: Secondary | ICD-10-CM

## 2017-12-23 DIAGNOSIS — S81001S Unspecified open wound, right knee, sequela: Secondary | ICD-10-CM | POA: Diagnosis not present

## 2017-12-23 DIAGNOSIS — N3000 Acute cystitis without hematuria: Secondary | ICD-10-CM | POA: Diagnosis not present

## 2017-12-23 DIAGNOSIS — L97909 Non-pressure chronic ulcer of unspecified part of unspecified lower leg with unspecified severity: Secondary | ICD-10-CM | POA: Diagnosis not present

## 2017-12-23 DIAGNOSIS — D509 Iron deficiency anemia, unspecified: Secondary | ICD-10-CM | POA: Diagnosis not present

## 2017-12-23 DIAGNOSIS — I70201 Unspecified atherosclerosis of native arteries of extremities, right leg: Secondary | ICD-10-CM | POA: Diagnosis not present

## 2017-12-23 DIAGNOSIS — S81801S Unspecified open wound, right lower leg, sequela: Secondary | ICD-10-CM | POA: Diagnosis not present

## 2017-12-23 DIAGNOSIS — Z4781 Encounter for orthopedic aftercare following surgical amputation: Secondary | ICD-10-CM | POA: Diagnosis not present

## 2017-12-23 DIAGNOSIS — I70245 Atherosclerosis of native arteries of left leg with ulceration of other part of foot: Secondary | ICD-10-CM | POA: Diagnosis not present

## 2017-12-23 DIAGNOSIS — D649 Anemia, unspecified: Secondary | ICD-10-CM | POA: Diagnosis not present

## 2017-12-23 DIAGNOSIS — L97414 Non-pressure chronic ulcer of right heel and midfoot with necrosis of bone: Secondary | ICD-10-CM | POA: Diagnosis not present

## 2017-12-23 DIAGNOSIS — N183 Chronic kidney disease, stage 3 (moderate): Secondary | ICD-10-CM

## 2017-12-23 DIAGNOSIS — E034 Atrophy of thyroid (acquired): Secondary | ICD-10-CM | POA: Diagnosis not present

## 2017-12-23 DIAGNOSIS — L89621 Pressure ulcer of left heel, stage 1: Secondary | ICD-10-CM | POA: Diagnosis not present

## 2017-12-23 DIAGNOSIS — E039 Hypothyroidism, unspecified: Secondary | ICD-10-CM | POA: Diagnosis not present

## 2017-12-23 DIAGNOSIS — R2681 Unsteadiness on feet: Secondary | ICD-10-CM | POA: Diagnosis not present

## 2017-12-23 DIAGNOSIS — N3946 Mixed incontinence: Secondary | ICD-10-CM | POA: Diagnosis not present

## 2017-12-23 DIAGNOSIS — R488 Other symbolic dysfunctions: Secondary | ICD-10-CM | POA: Diagnosis not present

## 2017-12-23 DIAGNOSIS — M810 Age-related osteoporosis without current pathological fracture: Secondary | ICD-10-CM | POA: Diagnosis not present

## 2017-12-23 DIAGNOSIS — I129 Hypertensive chronic kidney disease with stage 1 through stage 4 chronic kidney disease, or unspecified chronic kidney disease: Secondary | ICD-10-CM | POA: Diagnosis not present

## 2017-12-23 DIAGNOSIS — N179 Acute kidney failure, unspecified: Secondary | ICD-10-CM | POA: Diagnosis not present

## 2017-12-23 MED ORDER — SALINE SPRAY 0.65 % NA SOLN
1.0000 | NASAL | Status: DC | PRN
  Administered 2017-12-23: 1 via NASAL
  Filled 2017-12-23: qty 44

## 2017-12-23 MED ORDER — PANTOPRAZOLE SODIUM 40 MG PO TBEC: 40.0000 mg | DELAYED_RELEASE_TABLET | Freq: Every day | ORAL | 0 refills | Status: DC

## 2017-12-23 MED ORDER — ENSURE ENLIVE PO LIQD: 237.0000 mL | Freq: Three times a day (TID) | ORAL | 12 refills | Status: DC

## 2017-12-23 MED ORDER — POLYETHYLENE GLYCOL 3350 17 G PO PACK: 17.0000 g | PACK | Freq: Two times a day (BID) | ORAL | 0 refills | Status: DC

## 2017-12-23 MED ORDER — SENNA 8.6 MG PO TABS: 2.0000 | ORAL_TABLET | Freq: Every day | ORAL | 0 refills | Status: DC

## 2017-12-23 MED ORDER — DOCUSATE SODIUM 100 MG PO CAPS: 100.0000 mg | ORAL_CAPSULE | Freq: Every day | ORAL | 0 refills | Status: DC

## 2017-12-23 MED ORDER — MORPHINE SULFATE (PF) 4 MG/ML IV SOLN
INTRAVENOUS | Status: AC
  Filled 2017-12-23: qty 1

## 2017-12-23 MED ORDER — JUVEN PO PACK: 1.0000 | PACK | Freq: Two times a day (BID) | ORAL | 0 refills | Status: DC

## 2017-12-23 NOTE — Progress Notes (Signed)
Discharged to [facility] per [employer].  Written instructions sent with the patient.

## 2017-12-23 NOTE — Clinical Social Work Placement (Signed)
   CLINICAL SOCIAL WORK PLACEMENT  NOTE  Date:  12/23/2017  Patient Details  Name: Shelby Reese MRN: [id number] Date of Birth: 1935/11/07  Clinical Social Work is seeking post-discharge placement for this patient at the [place] level of care (*CSW will initial, date and re-position this form in  chart as items are completed):  Yes   Patient/family provided with [facility] Work Department's list of facilities offering this level of care within the geographic area requested by the patient (or if unable, by the patient's family).  Yes   Patient/family informed of their freedom to choose among providers that offer the needed level of care, that participate in Medicare, Medicaid or managed care program needed by the patient, have an available bed and are willing to accept the patient.  Yes   Patient/family informed of [facility]'s ownership interest in [facility] and [facility], as well as of the fact that they are under no obligation to receive care at these facilities.  PASRR submitted to EDS on       PASRR number received on       Existing PASRR number confirmed on 12/17/17     FL2 transmitted to all facilities in geographic area requested by pt/family on       FL2 transmitted to all facilities within larger geographic area on 12/17/17     Patient informed that his/her managed care company has contracts with or will negotiate with certain facilities, including the following:        Yes   Patient/family informed of bed offers received.  Patient chooses bed at [facility]     Physician recommends and patient chooses bed at      Patient to be transferred to [facility] on 12/23/17.  Patient to be transferred to facility by [facility]     Patient family notified on 12/23/17 of transfer.  Name of family member notified:  pt repsonsible for self     PHYSICIAN       Additional Comment:     _______________________________________________ Normajean Baxter, LCSW 12/23/2017, 2:28 PM

## 2017-12-23 NOTE — Progress Notes (Signed)
Report was called to [place] (nurse).  Facility is aware that [facility] will be providing transportation approx at 4 pm

## 2017-12-23 NOTE — Progress Notes (Signed)
PT Cancellation Note  Patient Details Name: Shelby Reese MRN: [id number] DOB: 02-Jan-1935   Cancelled Treatment:    Reason Eval/Treat Not Completed: Patient declined, no reason specified Patient declines PT stating she has a lot going on between herself and her husband being in the hospital, also she says she might be getting transferred out of here today. She declines all mobility/up to chair as well as bed level exercises today.    Deniece Ree PT, DPT, CBIS  Supplemental Physical Therapist [facility]   Pager [phone]

## 2017-12-23 NOTE — Social Work (Signed)
Clinical Social Worker facilitated patient discharge including contacting patient family and facility to confirm patient discharge plans.  Clinical information faxed to facility and family agreeable with plan.    CSW arranged ambulance transport via [facility] to [facility] at 4:00pm.    RN to call [phone] to give report prior to discharge.  Clinical Social Worker will sign off for now as social work intervention is no longer needed. Please consult [place] again if new need arises.  Shelby Hefty, LCSW Clinical Social Worker [phone]

## 2017-12-23 NOTE — Discharge Summary (Signed)
Physician Discharge Summary  Patient ID: Shelby Reese MRN: [id number] DOB/AGE: Jun 16, 1935 83 y.o.  Admit date: 12/12/2017 Discharge date: 12/23/2017  Admission Diagnoses:  Discharge Diagnoses:  Principal Problem:   Sepsis ([place])    Peripheral artery disease left lower extremity.    Left heel gangrene.     Morbid obesity.     Status post right above-knee amputation Active Problems:   Iron deficiency anemia   CKD (chronic kidney disease), stage III (HCC)   Wound of right leg, sequela   Discharged Condition: stable  Hospital Course: Patient is an 83 year old Caucasian female, morbidly obese, with past medical history significant for PVD and persistent ulceration of right knee (medially).  Patient underwent limb salvage intervention with debridement, skin graft therapy and wound VAC therapy recently, on 12/05/2017.patient was admitted from the skilled nursing facility 12/12/2017 with worsening right knee pain and malodorous drainage.  On presentation, the patient was found to be septic.  The sepsis was thought to be secondary to the right knee wound.  Apparently, the patient's right leg had become more tender, requiring increase pain medications, increased redness, and more frequent malodorous drainage.  Patient was admitted for further assessment and management.  The patient was pancultured.  Patient was started on broad-spectrum IV antibiotics.  The patient eventually underwent right above-knee amputation.  Gangrene was also noted on the left heel.  Vascular team advised vascular workup.  Arterial vascular ultrasound of the left lower extremity revealed stenosis of the common femoral artery.  Vascular team advised left above-knee amputation that was supposed to take place on 12/22/2017.  However, the patient has decided to consider the left above-knee amputation due to social issues.  The patient will be discharged back to the skilled nursing facility.  The patient will follow with the primary care  provider and the vascular surgery team.  Further surgery will be arranged by the patient's PCP and the vascular surgery team.  Sepsis secondary to right knee wound infection Patient had multiple irrigation and debridement.  Previous wound VAC was removed.  No significant improvement with conservative approach.  The patient presented with right above-knee amputation on 12/15/2016 by Dr. Oneida Alar.   Acute on chronic anemia, iron deficiency Hemoglobin on presentation was 6.7 g/dL.  This is thought to be secondary to likely chronic disease and chronic blood loss anemia.  The patient was transfused with a total of 4 units of packed red blood cells, 2 unit of packed red blood cells on each occasion.  There will be need to monitor patient's hemoglobin.  Continue iron supplements for today.    Hypertension, relative hypotension, likely related to sepsis-this has resolved.  Patient's antihypertensives have been restarted.  CKD stage III, stable-monitor for now.  Continue to monitor electrolytes as well.  Left lower extremity edema/cellulitis/gangrene of the left heel: Arterial vascular ultrasound of the left lower extremity reveals stenosis of the common femoral artery.  Patient was unable to tolerate ABI on the left lower extremity. 2D echo: Normal EF and grade 2 diastolic dysfunction.  Vascular surgery team advised left above-knee amputation by the patient has deferred this surgery.  Patient will be discharged back to the skilled nursing facility today.  Wound care consult input is appreciated.  Blood cultures did not grow any organisms.  MRSA PCR came back negative.  Consults: vascular surgery  Significant Diagnostic Studies: Arterial Doppler ultrasound revealed stenosis of the left common femoral artery.  Treatments: surgery: Right above-knee amputation.  Left above-knee amputation was also planned by  the patient canceled the procedure.  Discharge Exam: Blood pressure (!) 178/63, pulse 95,  temperature 98 F (36.7 C), temperature source Oral, resp. rate 16, height 5\' 3"  (1.6 m), weight 103 kg (227 lb), SpO2 97 %.   Disposition: 03-Skilled Nursing Facility  Discharge Instructions    Call MD for:   Complete by:  As directed    Please call MD if symptoms worsen   Diet - low sodium heart healthy   Complete by:  As directed    Increase activity slowly   Complete by:  As directed      Allergies as of 12/23/2017      Reactions   Cinnamon Hives   Ciprofloxacin Other (See Comments)   TREMORS   Diovan [valsartan] Other (See Comments)   Extreme vertigo   Food Diarrhea, Other (See Comments)   ORANGE JUICE   UPSET STOMACH   Latex Rash, Other (See Comments)   Rash/inflammation due to exposure   Nitrofuran Derivatives Hives, Rash   "Full body rash"   Penicillins Hives, Swelling   *tolerated Ceftriaxone September 2018 Has patient had a PCN reaction causing immediate rash, facial/tongue/throat swelling, SOB or lightheadedness with hypotension:No--severe irritation at the injection site Has patient had a PCN reaction causing severe rash involving mucus membranes or skin necrosis:Unknown Has patient had a PCN reaction that required hospitalization:No Has patient had a PCN reaction occurring within the last 10 years:Yes If all of the above answers are "NO", then may proceed with   Bactrim [sulfamethoxazole-trimethoprim] Diarrhea, Nausea Only   Other Rash   Mycins, Strawberry, Oranges   Sulfa Antibiotics Diarrhea, Nausea Only      Medication List    STOP taking these medications   acetaminophen 500 MG tablet Commonly known as:  TYLENOL   diphenhydrAMINE 50 MG capsule Commonly known as:  BENADRYL   Gauze Pads & Dressings 4"X4-1/2" Pads   ibuprofen 200 MG tablet Commonly known as:  ADVIL,MOTRIN   KERLIX GAUZE ROLL LARGE Misc   MAG-200 PO   NATURAL PSYLLIUM FIBER PO     TAKE these medications   aspirin EC 81 MG tablet Take 81 mg by mouth daily with breakfast.    benazepril 40 MG tablet Commonly known as:  LOTENSIN Take 40 mg by mouth daily with breakfast.   CALCIUM 600+D 600-400 MG-UNIT tablet Generic drug:  Calcium Carbonate-Vitamin D Take 1 tablet by mouth daily at 3 pm.   cetirizine 10 MG tablet Commonly known as:  ZYRTEC Take 10 mg by mouth at bedtime.   diltiazem 120 MG 24 hr capsule Commonly known as:  TIAZAC Take 120 mg by mouth daily with breakfast.   docusate sodium 100 MG capsule Commonly known as:  COLACE Take 1 capsule (100 mg total) by mouth daily. Start taking on:  12/24/2017   famotidine 40 MG tablet Commonly known as:  PEPCID Take 40 mg by mouth daily at 3 pm. 1600   feeding supplement (ENSURE ENLIVE) Liqd Take 237 mLs by mouth 3 (three) times daily with meals.   nutrition supplement (JUVEN) Pack Take 1 packet by mouth 2 (two) times daily between meals.   ferrous sulfate 325 (65 FE) MG tablet Take 325 mg by mouth 2 (two) times daily with a meal.   hydrALAZINE 25 MG tablet Commonly known as:  APRESOLINE Take 1 tablet (25 mg total) by mouth every 8 (eight) hours.   multivitamin with minerals Tabs tablet Take 1 tablet by mouth daily.   oxyCODONE-acetaminophen 5-325 MG tablet Commonly  known as:  PERCOCET/ROXICET Take 1 tablet by mouth every 4 (four) hours. Scheduled, and 1 by mouth every 6 hours as needed for pain x 14 days starting 12/11/17   pantoprazole 40 MG tablet Commonly known as:  PROTONIX Take 1 tablet (40 mg total) by mouth daily. Start taking on:  12/24/2017   polyethylene glycol packet Commonly known as:  MIRALAX / GLYCOLAX Take 17 g by mouth 2 (two) times daily.   senna 8.6 MG Tabs tablet Commonly known as:  SENOKOT Take 2 tablets (17.2 mg total) by mouth daily. Start taking on:  12/24/2017   SYNTHROID 175 MCG tablet Generic drug:  levothyroxine Take 175 mcg by mouth daily before breakfast.   vitamin C 500 MG tablet Commonly known as:  ASCORBIC ACID Take 500 mg by mouth daily.       Contact information for after-discharge care    Destination    [facility] .   Service:  Skilled Nursing Contact information: [address] [place] [phone]              Signed: Bonnell Public 12/23/2017, 1:58 PM

## 2017-12-24 ENCOUNTER — Encounter: Payer: Self-pay | Admitting: Internal Medicine

## 2017-12-24 ENCOUNTER — Non-Acute Institutional Stay (SKILLED_NURSING_FACILITY): Payer: Medicare Other | Admitting: Internal Medicine

## 2017-12-24 DIAGNOSIS — S81801S Unspecified open wound, right lower leg, sequela: Secondary | ICD-10-CM

## 2017-12-24 DIAGNOSIS — I70201 Unspecified atherosclerosis of native arteries of extremities, right leg: Secondary | ICD-10-CM

## 2017-12-24 DIAGNOSIS — N179 Acute kidney failure, unspecified: Secondary | ICD-10-CM | POA: Diagnosis not present

## 2017-12-24 DIAGNOSIS — L97909 Non-pressure chronic ulcer of unspecified part of unspecified lower leg with unspecified severity: Secondary | ICD-10-CM | POA: Diagnosis not present

## 2017-12-24 DIAGNOSIS — I739 Peripheral vascular disease, unspecified: Secondary | ICD-10-CM | POA: Diagnosis not present

## 2017-12-24 DIAGNOSIS — D62 Acute posthemorrhagic anemia: Secondary | ICD-10-CM | POA: Diagnosis not present

## 2017-12-24 DIAGNOSIS — A419 Sepsis, unspecified organism: Secondary | ICD-10-CM | POA: Diagnosis not present

## 2017-12-24 DIAGNOSIS — E034 Atrophy of thyroid (acquired): Secondary | ICD-10-CM | POA: Diagnosis not present

## 2017-12-24 DIAGNOSIS — I10 Essential (primary) hypertension: Secondary | ICD-10-CM

## 2017-12-24 DIAGNOSIS — N182 Chronic kidney disease, stage 2 (mild): Secondary | ICD-10-CM

## 2017-12-24 DIAGNOSIS — L97414 Non-pressure chronic ulcer of right heel and midfoot with necrosis of bone: Secondary | ICD-10-CM | POA: Diagnosis not present

## 2017-12-24 DIAGNOSIS — S81001S Unspecified open wound, right knee, sequela: Secondary | ICD-10-CM

## 2017-12-24 DIAGNOSIS — K219 Gastro-esophageal reflux disease without esophagitis: Secondary | ICD-10-CM | POA: Diagnosis not present

## 2017-12-24 LAB — BPAM RBC
Blood Product Expiration Date: 201902152359
Blood Product Expiration Date: 201902152359
Blood Product Expiration Date: 201902152359
Blood Product Expiration Date: 201902162359
Blood Product Expiration Date: 201902182359
Blood Product Expiration Date: 201902182359
ISSUE DATE / TIME: 201901311327
ISSUE DATE / TIME: 201901311327
ISSUE DATE / TIME: 201902031651
ISSUE DATE / TIME: 201902032236
ISSUE DATE / TIME: 201902041707
ISSUE DATE / TIME: 201902042145
UNIT TYPE AND RH: 6200
UNIT TYPE AND RH: 6200
Unit Type and Rh: 6200
Unit Type and Rh: 6200
Unit Type and Rh: 6200
Unit Type and Rh: 6200

## 2017-12-24 LAB — TYPE AND SCREEN
ABO/RH(D): A POS
Antibody Screen: NEGATIVE
UNIT DIVISION: 0
UNIT DIVISION: 0
UNIT DIVISION: 0
Unit division: 0
Unit division: 0
Unit division: 0

## 2017-12-24 NOTE — Progress Notes (Signed)
Location:  [place] Room Number: 101P Place of Service:  SNF [id number]) Provider; Inocencio Homes MD   Patient Care Team: [name], MD as PCP - General (Internal Medicine) Carolan Clines, MD as Attending Physician (Urology) Charlies Silvers, MD as Consulting Physician (Allergy)  Extended Emergency Contact Information Primary Emergency Contact: Savarino,Michael Address: [address]          [place], [place] [address] Johnnette Litter of [place] Phone: [phone] Relation: Spouse    Allergies: Cinnamon; Ciprofloxacin; Diovan [valsartan]; Food; Latex; Nitrofuran derivatives; Penicillins; Bactrim [sulfamethoxazole-trimethoprim]; Other; and Sulfa antibiotics  Chief Complaint  Patient presents with  . New Admit To SNF    HPI: Patient is 83 y.o. female with chronic kidney disease stage II, hypertension, and obesity, with chronic wound to the right leg. She has undergone multiple debridements and skin grafts on right leg per Dr. Sharol Given in the past 4 months. At skilled nursing facility she had increased pain and increased drainage with odor from wound and was sent to ED. She was sent back to skilled nursing facility with the understanding that she was see Dr. Dian Situ next day. Dr. Drue Dun office was reluctant to see her but after multiple, multiple phone calls from skilled nursing facility patient was seen in Dr. Drue Dun office where he decided that she needed an AKA. Patient then presented to the emergency department with continuous pain and drainage from her wound. In ED was vascular surgery was consulted to see patient and  agreed that she needed an AKA and she agreed to undergo the operation with vascular surgery. Patient was admitted to [facility] from 1/25-12/5 where she underwent a right AKA which took place on 12/22/17. Patient also needs a left AKA but she was not related to do this at this time. Patient is admitted to skilled nursing facility for OT/PT.  While at skilled nursing facility patient will be followed for hypertension treated with Benzapril diltiazem and hydralazine, GERD treated with Protonix and hypothyroidism treated with Synthroid.  Past Medical History:  Diagnosis Date  . Allergy   . Anemia   . CKD (chronic kidney disease), stage II   . Fibromyalgia   . GERD (gastroesophageal reflux disease)   . Hiatal hernia   . Hypertension   . Hypothyroid   . Lymphedema    venous insufficency  . Osteoarthritis   . Peripheral vascular disease ([place])   . Ulcer of knee ([place])    right  . Urinary incontinence   . Varicose veins   . Venous insufficiency     Past Surgical History:  Procedure Laterality Date  . AMPUTATION Right 12/15/2017   Procedure: AMPUTATION ABOVE KNEE, RIGHT;  Surgeon: Elam Dutch, MD;  Location: [place];  Service: Vascular;  Laterality: Right;  . APPLICATION OF WOUND VAC Right 07/18/2017   Procedure: APPLICATION OF WOUND VAC;  Surgeon: [name], MD;  Location: [place];  Service: Orthopedics;  Laterality: Right;  . CATARACT EXTRACTION W/ INTRAOCULAR LENS  IMPLANT, BILATERAL    . COLONOSCOPY    . I&D EXTREMITY Right 07/18/2017   Procedure: IRRIGATION AND DEBRIDEMENT RIGHT KNEE;  Surgeon: [name], MD;  Location: [place];  Service: Orthopedics;  Laterality: Right;  . I&D EXTREMITY Right 07/23/2017   Procedure: REPEAT IRRIGATION AND DEBRIDEMENT RIGHT KNEE;  Surgeon: [name], MD;  Location: [place];  Service: Orthopedics;  Laterality: Right;  . I&D EXTREMITY Right 11/28/2017   Procedure: IRRIGATION AND DEBRIDEMENT  RIGHT KNEE , APPLY INSTILLATION VAC;  Surgeon: [name], MD;  Location: [place];  Service: Orthopedics;  Laterality: Right;  . I&D EXTREMITY Right 12/03/2017   Procedure: REPEAT IRRIGATION AND DEBRIDEMENT RIGHT KNEE AND APPLICATION OF A WOUND VAC.;  Surgeon: [name], MD;  Location: [place];  Service: Orthopedics;  Laterality: Right;  . KNEE ARTHROSCOPY Left    menisectomy  . MULTIPLE  TOOTH EXTRACTIONS    . SKIN SPLIT GRAFT Right 07/25/2017   Procedure: Repeat Irrigation and Debridement Right Knee, Split Thickness Skin Graft;  Surgeon: [name], MD;  Location: [place];  Service: Orthopedics;  Laterality: Right;  . SKIN SPLIT GRAFT Right 12/05/2017   Procedure: SKIN GRAFT SPLIT THICKNESS WOUND KNEE, APPLY VAC;  Surgeon: [name], MD;  Location: [place];  Service: Orthopedics;  Laterality: Right;  . TONSILLECTOMY      Allergies as of 12/24/2017      Reactions   Cinnamon Hives   Ciprofloxacin Other (See Comments)   TREMORS   Diovan [valsartan] Other (See Comments)   Extreme vertigo   Food Diarrhea, Other (See Comments)   ORANGE JUICE   UPSET STOMACH   Latex Rash, Other (See Comments)   Rash/inflammation due to exposure   Nitrofuran Derivatives Hives, Rash   "Full body rash"   Penicillins Hives, Swelling   *tolerated Ceftriaxone September 2018 Has patient had a PCN reaction causing immediate rash, facial/tongue/throat swelling, SOB or lightheadedness with hypotension:No--severe irritation at the injection site Has patient had a PCN reaction causing severe rash involving mucus membranes or skin necrosis:Unknown Has patient had a PCN reaction that required hospitalization:No Has patient had a PCN reaction occurring within the last 10 years:Yes If all of the above answers are "NO", then may proceed with   Bactrim [sulfamethoxazole-trimethoprim] Diarrhea, Nausea Only   Other Rash   Mycins, Strawberry, Oranges   Sulfa Antibiotics Diarrhea, Nausea Only      Medication List        Accurate as of 12/24/17  9:42 AM. Always use your most recent med list.          aspirin EC 81 MG tablet Take 81 mg by mouth daily with breakfast.   benazepril 40 MG tablet Commonly known as:  LOTENSIN Take 40 mg by mouth daily with breakfast.   CALCIUM 600+D 600-400 MG-UNIT tablet Generic drug:  Calcium Carbonate-Vitamin D Take 1 tablet by mouth daily at 3 pm.   cetirizine 10  MG tablet Commonly known as:  ZYRTEC Take 10 mg by mouth at bedtime.   diltiazem 120 MG 24 hr capsule Commonly known as:  TIAZAC Take 120 mg by mouth daily with breakfast.   docusate sodium 100 MG capsule Commonly known as:  COLACE Take 1 capsule (100 mg total) by mouth daily.   famotidine 40 MG tablet Commonly known as:  PEPCID Take 40 mg by mouth daily at 3 pm. 1600   feeding supplement (ENSURE ENLIVE) Liqd Take 237 mLs by mouth 3 (three) times daily with meals.   nutrition supplement (JUVEN) Pack Take 1 packet by mouth 2 (two) times daily between meals.   ferrous sulfate 325 (65 FE) MG tablet Take 325 mg by mouth 2 (two) times daily with a meal.   hydrALAZINE 25 MG tablet Commonly known as:  APRESOLINE Take 1 tablet (25 mg total) by mouth every 8 (eight) hours.   multivitamin with minerals Tabs tablet Take 1 tablet by mouth daily.   oxyCODONE-acetaminophen 5-325 MG  tablet Commonly known as:  PERCOCET/ROXICET Take 1 tablet by mouth every 4 (four) hours. Scheduled, and 1 by mouth every 6 hours as needed for pain x 14 days starting 12/11/17   pantoprazole 40 MG tablet Commonly known as:  PROTONIX Take 1 tablet (40 mg total) by mouth daily.   polyethylene glycol packet Commonly known as:  MIRALAX / GLYCOLAX Take 17 g by mouth 2 (two) times daily.   senna 8.6 MG Tabs tablet Commonly known as:  SENOKOT Take 2 tablets (17.2 mg total) by mouth daily.   SYNTHROID 175 MCG tablet Generic drug:  levothyroxine Take 175 mcg by mouth daily before breakfast.   vitamin C 500 MG tablet Commonly known as:  ASCORBIC ACID Take 500 mg by mouth daily.       No orders of the defined types were placed in this encounter.   Immunization History  Administered Date(s) Administered  . Influenza-Unspecified 12/08/2017  . Tdap 06/28/2015    Social History   Tobacco Use  . Smoking status: Former Smoker    Packs/day: 0.50    Years: 10.00    Pack years: 5.00    Last attempt  to quit: 11/19/1979    Years since quitting: 38.1  . Smokeless tobacco: Never Used  Substance Use Topics  . Alcohol use: Yes    Alcohol/week: 8.4 oz    Types: 14 Glasses of wine per week    Comment: 2 glasses of wine with dinner    Review of Systems  DATA OBTAINED: from patient GENERAL:  no fevers, fatigue, appetite changes SKIN: No itching, rash HEENT: No complaint RESPIRATORY: No cough, wheezing, SOB CARDIAC: No chest pain, palpitations, lower extremity edema  GI: No abdominal pain, No N/V/D or constipation, No heartburn or reflux  GU: No dysuria, frequency or urgency, or incontinence  MUSCULOSKELETAL: Complains of pain and phantom pain NEUROLOGIC: No headache, dizziness  PSYCHIATRIC: No overt anxiety or sadness  Vitals:   12/24/17 0929  BP: (!) 172/76  Pulse: 90  Resp: 18  Temp: (!) 97.1 F (36.2 C)  SpO2: 94%   Body mass index is 40.21 kg/m. Physical Exam  GENERAL APPEARANCE: Alert, conversant, No acute distress  SKIN: No diaphoresis rash HEENT: Unremarkable RESPIRATORY: Breathing is even, unlabored. Lung sounds are clear   CARDIOVASCULAR: Heart RRR no murmurs, rubs or gallops. No peripheral edema  GASTROINTESTINAL: Abdomen is soft, non-tender, not distended w/ normal bowel sounds.  GENITOURINARY: Bladder non tender, not distended  MUSCULOSKELETAL: Right AKA with surgical dressings, with left heel dressed NEUROLOGIC: Cranial nerves 2-12 grossly intact. Moves all extremities PSYCHIATRIC: Mood and affect appropriate to situation, no behavioral issues  Patient Active Problem List   Diagnosis Date Noted  . Acute blood loss as cause of postoperative anemia 12/13/2017  . Wound of right leg, sequela 12/12/2017  . Open wound of right knee 11/28/2017  . Allergic drug rash   . Sepsis ([place]) 08/29/2017  . CKD (chronic kidney disease), stage III ([place]) 08/29/2017  . Hypothyroid 08/29/2017  . Hypertension 08/29/2017  . Peripheral vascular disease ([place]) 08/29/2017  . Skin  ulcer of knee, right, with fat layer exposed ([place]) 08/29/2017  . Acute kidney injury ([place]) 08/29/2017  . Metabolic acidosis 44/11/270  . Acute hyponatremia 08/29/2017  . Pressure injury of skin 07/25/2017  . Wound, open, knee, lower leg, or ankle with complication, right, initial encounter   . Idiopathic chronic venous hypertension of both lower extremities with ulcer and inflammation ([place]) 07/15/2017  . Skin ulcer of  right knee with necrosis of muscle ([place]) 07/15/2017  . Atherosclerosis of artery of right lower extremity ([place]) 07/11/2017  . Essential hypertension 07/11/2017  . Osteoporosis 07/11/2017  . Cataracts, bilateral 07/11/2017  . Hypothyroidism, acquired 12/27/2014  . Iron deficiency anemia 12/27/2014  . Hiatal hernia 12/27/2014  . Varicose veins of lower extremities with other complications 65/78/4696    CMP     Component Value Date/Time   NA 136 12/22/2017 0843   NA 133 (A) 12/11/2017   NA 140 04/15/2013 1516   K 3.3 (L) 12/22/2017 0843   K 4.3 04/15/2013 1516   CL 99 (L) 12/22/2017 0843   CL 105 04/15/2013 1516   CO2 23 12/22/2017 0843   CO2 25 04/15/2013 1516   GLUCOSE 85 12/22/2017 0843   GLUCOSE 94 04/15/2013 1516   BUN 10 12/22/2017 0843   BUN 16 12/11/2017   BUN 28.2 (H) 04/15/2013 1516   CREATININE 0.93 12/22/2017 0843   CREATININE 1.21 (H) 12/25/2014 1530   CREATININE 1.4 (H) 04/15/2013 1516   CALCIUM 8.6 (L) 12/22/2017 0843   CALCIUM 9.9 04/15/2013 1516   PROT 5.7 (L) 12/17/2017 0533   PROT 7.8 04/15/2013 1516   ALBUMIN 2.0 (L) 12/22/2017 0843   ALBUMIN 3.7 04/15/2013 1516   AST 21 12/17/2017 0533   AST 17 04/15/2013 1516   ALT 13 (L) 12/17/2017 0533   ALT 15 04/15/2013 1516   ALKPHOS 65 12/17/2017 0533   ALKPHOS 73 04/15/2013 1516   BILITOT 0.7 12/17/2017 0533   BILITOT 0.25 04/15/2013 1516   GFRNONAA 57 (L) 12/22/2017 0843   GFRNONAA 43 (L) 12/25/2014 1530   GFRAA >60 12/22/2017 0843   GFRAA 50 (L) 12/25/2014 1530   Recent Labs     08/29/17 1756  12/19/17 0542 12/20/17 0503 12/22/17 0843  NA  --    < > 133* 133* 136  K  --    < > 3.6 3.6 3.3*  CL  --    < > 102 102 99*  CO2  --    < > 23 22 23   GLUCOSE  --    < > 92 105* 85  BUN  --    < > 18 16 10   CREATININE  --    < > 1.04* 1.00 0.93  CALCIUM  --    < > 8.1* 8.1* 8.6*  MG 1.8  --   --   --   --   PHOS  --   --   --   --  2.8   < > = values in this interval not displayed.   Recent Labs    12/12/17 1527  12/15/17 0716 12/17/17 0533 12/22/17 0843  AST 18  --  18 21  --   ALT 13*  --  14 13*  --   ALKPHOS 69  --  70 65  --   BILITOT 0.2*  --  0.6 0.7  --   PROT 6.3*  --  6.3* 5.7*  --   ALBUMIN 2.5*   < > 2.4* 2.0* 2.0*   < > = values in this interval not displayed.   Recent Labs    12/11/17 12/12/17 1527  12/19/17 0542 12/20/17 0503 12/22/17 0843  WBC 13.3 14.5*   < > 11.4* 10.3 13.5*  NEUTROABS 10 11.0*  --   --   --  10.5*  HGB 7.2* 6.7*   < > 7.2* 7.3* 10.5*  HCT 23* 21.9*   < > 24.0*  23.3* 33.6*  MCV  --  79.6   < > 83.6 83.5 82.0  PLT 498* 459*   < > 394 395 505*   < > = values in this interval not displayed.   No results for input(s): CHOL, LDLCALC, TRIG in the last 8760 hours.  Invalid input(s): HCL No results found for: MICROALBUR Lab Results  Component Value Date   TSH 0.298 (L) 09/09/2017   Lab Results  Component Value Date   HGBA1C 5.6 12/12/2017   No results found for: CHOL, HDL, LDLCALC, LDLDIRECT, TRIG, CHOLHDL  Significant Diagnostic Results in last 30 days:    Assessment and Plan  Sepsis/right knee wound infection-patient had been treated prior with at least 2 attempts of skin grafting with wound VAC to knee in the past 4 months without success; vascular ulcer on her left lower extremity without stenosis of the common femoral artery as well, was done with gangrene was noted to left heel; patient was covered with broad-spectrum antibiotics and underwent right AKA with no problems SNF - admitted for OT/PT; all  antibiotics are finished  Acute blood loss anemia postop SNF - appears the patient's hemoglobin was 7.3 after surgery and on discharge is 10.5 the patient must have received a transfusion; will follow-up; will continue iron 325 twice a day  Gangrene left heel-[facility] showed stenosis of the common femoral artery; a left AV AKA was recommended but patient declined SNF - continue wound care; patient will not heal secondary to's poor blood flow at eventually will undergo a left AKA; continue ASA 81 mg by mouth daily  Acute on Chronic kidney disease stage III-improved with IV fluids SNF - will follow BMP  Hypertension SNF - recently well controlled; continue Benzapril 40 mg daily, diltiazem 120 mg daily and hydralazine 25 mg every 8 hours  GERD SNF - not stated as uncontrolled; continue Pepcid 40 mg daily and Protonix 40 mg daily  Hypothyroidism SNF - not stated as uncontrolled; continue Synthroid 175 g by mouth daily   Time spent greater than 45 minutes-I spoke with patient with length about her situation Webb Silversmith D. Sheppard Coil, MD

## 2017-12-25 ENCOUNTER — Other Ambulatory Visit: Payer: Self-pay

## 2017-12-27 ENCOUNTER — Encounter: Payer: Self-pay | Admitting: Internal Medicine

## 2017-12-27 DIAGNOSIS — N179 Acute kidney failure, unspecified: Secondary | ICD-10-CM | POA: Insufficient documentation

## 2017-12-27 DIAGNOSIS — L97909 Non-pressure chronic ulcer of unspecified part of unspecified lower leg with unspecified severity: Secondary | ICD-10-CM

## 2017-12-27 DIAGNOSIS — N183 Chronic kidney disease, stage 3 unspecified: Secondary | ICD-10-CM | POA: Insufficient documentation

## 2017-12-27 DIAGNOSIS — I739 Peripheral vascular disease, unspecified: Secondary | ICD-10-CM | POA: Insufficient documentation

## 2017-12-27 DIAGNOSIS — L97414 Non-pressure chronic ulcer of right heel and midfoot with necrosis of bone: Secondary | ICD-10-CM | POA: Insufficient documentation

## 2017-12-27 DIAGNOSIS — K219 Gastro-esophageal reflux disease without esophagitis: Secondary | ICD-10-CM | POA: Insufficient documentation

## 2017-12-29 ENCOUNTER — Other Ambulatory Visit: Payer: Self-pay | Admitting: Licensed Clinical Social Worker

## 2017-12-29 DIAGNOSIS — I70245 Atherosclerosis of native arteries of left leg with ulceration of other part of foot: Secondary | ICD-10-CM | POA: Diagnosis not present

## 2017-12-29 DIAGNOSIS — L89621 Pressure ulcer of left heel, stage 1: Secondary | ICD-10-CM | POA: Diagnosis not present

## 2017-12-29 NOTE — Patient Outreach (Signed)
[facility]) Care Management  [name] Social Work  12/29/2017  Shelby Reese October 15, 1935 [id number]  Encounter Medications:  Outpatient Encounter Medications as of 12/29/2017  Medication Sig  . aspirin EC 81 MG tablet Take 81 mg by mouth daily with breakfast.  . benazepril (LOTENSIN) 40 MG tablet Take 40 mg by mouth daily with breakfast.   . Calcium Carbonate-Vitamin D (CALCIUM 600+D) 600-400 MG-UNIT per tablet Take 1 tablet by mouth daily at 3 pm.   . cetirizine (ZYRTEC) 10 MG tablet Take 10 mg by mouth at bedtime.   Marland Kitchen diltiazem (TIAZAC) 120 MG 24 hr capsule Take 120 mg by mouth daily with breakfast.   . docusate sodium (COLACE) 100 MG capsule Take 1 capsule (100 mg total) by mouth daily.  . famotidine (PEPCID) 40 MG tablet Take 40 mg by mouth daily at 3 pm. 1600  . feeding supplement, ENSURE ENLIVE, (ENSURE ENLIVE) LIQD Take 237 mLs by mouth 3 (three) times daily with meals.  . ferrous sulfate 325 (65 FE) MG tablet Take 325 mg by mouth 2 (two) times daily with a meal.  . hydrALAZINE (APRESOLINE) 25 MG tablet Take 1 tablet (25 mg total) by mouth every 8 (eight) hours.  . Multiple Vitamin (MULTIVITAMIN WITH MINERALS) TABS tablet Take 1 tablet by mouth daily.  . nutrition supplement, JUVEN, (JUVEN) PACK Take 1 packet by mouth 2 (two) times daily between meals.  Marland Kitchen oxyCODONE-acetaminophen (PERCOCET/ROXICET) 5-325 MG tablet Take 1 tablet by mouth every 4 (four) hours. Scheduled, and 1 by mouth every 6 hours as needed for pain x 14 days starting 12/11/17  . pantoprazole (PROTONIX) 40 MG tablet Take 1 tablet (40 mg total) by mouth daily.  . polyethylene glycol (MIRALAX / GLYCOLAX) packet Take 17 g by mouth 2 (two) times daily.  Marland Kitchen senna (SENOKOT) 8.6 MG TABS tablet Take 2 tablets (17.2 mg total) by mouth daily.  Marland Kitchen SYNTHROID 175 MCG tablet Take 175 mcg by mouth daily before breakfast.   . vitamin C (ASCORBIC ACID) 500 MG tablet Take 500 mg by mouth daily.   No facility-administered  encounter medications on file as of 12/29/2017.     Functional Status:  In your present state of health, do you have any difficulty performing the following activities: 12/29/2017 12/12/2017  Hearing? N -  Vision? N -  Difficulty concentrating or making decisions? N -  Walking or climbing stairs? Y -  Dressing or bathing? Y -  Doing errands, shopping? N N  Some recent data might be hidden    Fall/Depression Screening:  PHQ 2/9 Scores 12/29/2017  PHQ - 2 Score 0    Assessment: CSW completed SNF visit at [name] on 12/29/17. CSW met with SNF social worker before completing visit with patient as patient was in the middle of PT when [name] arrived. SNF social worker reports that she has met with patient already and that they will be having their first care team meeting tomorrow. SNF social worker reports that patient's spouse is also here at [name]. SNF social worker reports that patient is point of contact for family as spouse has dementia. SNF social worker reports that spouse was admitted to SNF after patient was. SNF social worker agreeable to keep [facility] CSW updated.   CSW arrived at patient's room and introduced self, reason for visit today and explained [name]. Patient agreeable to [name] involvement and signed consent. Patient reports that her main family support are her sister and  niece (who live out of state.) She shares that she recently appointed her sister as her 50. Patient denies wanting to make any changes to her advance directives at this time but appreciative of offer. Patient shares that her niece handles most of her finances. Patient reports that she does not want LTC placement at this time but reports that she has started to think about potential ALF placement in the near future but shares that she will need to ger some affairs in order first as well as make plans to sell her home because she will not be able to afford private pay for placement.  Patient denies experiencing any depressive symptoms and declines mental health resources even though documentation in chart expressed some concern for anxiety by inpatient chaplain. Patient reports that before her most recent hospitalization, she was the one preparing meals for her family and her spouse was providing transportation for her to her medical appointments. Wound Care and OT came in during visit and CSW was unable to finish assessment today.   Point of contact for family support- Emergency Contact information according to Pt:  Name: Marcie Bal (Mrs. Shelby Reese)  Addresses: [place] Home Phone: [phone] Cell Phone: [phone] Relation: Sister of Pt.    [facility] CM Care Plan Problem One     Most Recent Value  Care Plan Problem One  SNF admission to [facility]  Role Documenting the Problem One  [place] for Problem One  Active  [place] Term Goal   Patient will have a safe and stable discharge back home from SNF within 90 days as evidenced by patient self report  [facility] Long Term Goal Start Date  12/29/17  Interventions for Problem One Long Term Goal  CSW completed initial SNF visit with patient. CSW spoke with SNF social worker as well. Care team planning meeting tomorrow. CSW will coordinate with family, SNF social worker, patient and [facility] care team as needed.     Plan-CSW will follow up with SNF next week and continue to follow patient while she at SNF.   [name], BSW, MSW, [email or web address] Phone: [phone] Fax: [phone]

## 2017-12-30 ENCOUNTER — Non-Acute Institutional Stay (SKILLED_NURSING_FACILITY): Payer: Medicare Other | Admitting: Internal Medicine

## 2017-12-30 DIAGNOSIS — Z7189 Other specified counseling: Secondary | ICD-10-CM | POA: Diagnosis not present

## 2017-12-30 DIAGNOSIS — Z89611 Acquired absence of right leg above knee: Secondary | ICD-10-CM

## 2017-12-31 ENCOUNTER — Other Ambulatory Visit: Payer: Self-pay | Admitting: Licensed Clinical Social Worker

## 2017-12-31 NOTE — Patient Outreach (Signed)
Encounter created in error

## 2018-01-01 ENCOUNTER — Other Ambulatory Visit: Payer: Self-pay | Admitting: Licensed Clinical Social Worker

## 2018-01-01 NOTE — Patient Outreach (Signed)
[facility]) Care Management  [facility] Social Work  01/01/2018  Shelby Reese 1935-02-20 [id number]  Encounter Medications:  Outpatient Encounter Medications as of 01/01/2018  Medication Sig  . aspirin EC 81 MG tablet Take 81 mg by mouth daily with breakfast.  . benazepril (LOTENSIN) 40 MG tablet Take 40 mg by mouth daily with breakfast.   . Calcium Carbonate-Vitamin D (CALCIUM 600+D) 600-400 MG-UNIT per tablet Take 1 tablet by mouth daily at 3 pm.   . cetirizine (ZYRTEC) 10 MG tablet Take 10 mg by mouth at bedtime.   Marland Kitchen diltiazem (TIAZAC) 120 MG 24 hr capsule Take 120 mg by mouth daily with breakfast.   . docusate sodium (COLACE) 100 MG capsule Take 1 capsule (100 mg total) by mouth daily.  . famotidine (PEPCID) 40 MG tablet Take 40 mg by mouth daily at 3 pm. 1600  . feeding supplement, ENSURE ENLIVE, (ENSURE ENLIVE) LIQD Take 237 mLs by mouth 3 (three) times daily with meals.  . ferrous sulfate 325 (65 FE) MG tablet Take 325 mg by mouth 2 (two) times daily with a meal.  . hydrALAZINE (APRESOLINE) 25 MG tablet Take 1 tablet (25 mg total) by mouth every 8 (eight) hours.  . Multiple Vitamin (MULTIVITAMIN WITH MINERALS) TABS tablet Take 1 tablet by mouth daily.  . nutrition supplement, JUVEN, (JUVEN) PACK Take 1 packet by mouth 2 (two) times daily between meals.  Marland Kitchen oxyCODONE-acetaminophen (PERCOCET/ROXICET) 5-325 MG tablet Take 1 tablet by mouth every 4 (four) hours. Scheduled, and 1 by mouth every 6 hours as needed for pain x 14 days starting 12/11/17  . pantoprazole (PROTONIX) 40 MG tablet Take 1 tablet (40 mg total) by mouth daily.  . polyethylene glycol (MIRALAX / GLYCOLAX) packet Take 17 g by mouth 2 (two) times daily.  Marland Kitchen senna (SENOKOT) 8.6 MG TABS tablet Take 2 tablets (17.2 mg total) by mouth daily.  Marland Kitchen SYNTHROID 175 MCG tablet Take 175 mcg by mouth daily before breakfast.   . vitamin C (ASCORBIC ACID) 500 MG tablet Take 500 mg by mouth daily.   No facility-administered  encounter medications on file as of 01/01/2018.     Functional Status:  In your present state of health, do you have any difficulty performing the following activities: 12/29/2017 12/12/2017  Hearing? N -  Vision? N -  Difficulty concentrating or making decisions? N -  Walking or climbing stairs? Y -  Dressing or bathing? Y -  Doing errands, shopping? N N  Some recent data might be hidden    Fall/Depression Screening:  PHQ 2/9 Scores 12/29/2017  PHQ - 2 Score 0    Assessment: CSW completed SNF visit on 01/01/18 with patient and patient's spouse who are both at [facility] for short term care. Patient reports that she no longer thinks LTC placement will be an option for her in the future because she does not think they can afford it. Family plan on downsizing their home once they return home from SNF but husband reports that he would prefer not to sell their home and to just hire aides instead of relocating or LTC placement. Patient herself feels that relocating to a smaller place in a senior living environment would best suit them. Patient does not want to hire a personal care assistant as she had a bad experience the last time that she hired one. Family feel that they will be able to come up with a compromise once they are both home and able to  see their exact needs. Patient reports that her spouse will continue to provide transportation for her to her future medical appointments. Patient does not want any transportation resources. Patient reports her main concerns are using the bathroom and bathing once she returns home. Patient shares that her spouse can help some but that their bathrooms are pretty small in their home which will be a barrier. CSW provided education on personal care resources. Family receptive to information and education provided during SNF visit today.   Plan: CSW will follow up with SNF social worker within one week and will await for SNF discharge.   [name], BSW,  MSW, [email or web address] Phone: [phone] Fax: [phone]

## 2018-01-02 ENCOUNTER — Telehealth (INDEPENDENT_AMBULATORY_CARE_PROVIDER_SITE_OTHER): Payer: Self-pay | Admitting: Radiology

## 2018-01-02 ENCOUNTER — Other Ambulatory Visit (INDEPENDENT_AMBULATORY_CARE_PROVIDER_SITE_OTHER): Payer: Self-pay | Admitting: Family

## 2018-01-02 ENCOUNTER — Non-Acute Institutional Stay (SKILLED_NURSING_FACILITY): Payer: Medicare Other | Admitting: Internal Medicine

## 2018-01-02 DIAGNOSIS — T8149XA Infection following a procedure, other surgical site, initial encounter: Secondary | ICD-10-CM

## 2018-01-02 DIAGNOSIS — N3946 Mixed incontinence: Secondary | ICD-10-CM

## 2018-01-02 NOTE — Telephone Encounter (Signed)
Myriam Jacobson RN called from [employer] [employer] and said that patient has new serosanguinous drainage from her incision, it is a significant amount.  The incision itself looks ok, only has a few scabbed areas.  She just wanted to let you know it was draining now.  She applied a dry dressing.  Patient has an appt on Tuesday 2/19.  Please call her to advise on this?  Thanks.

## 2018-01-02 NOTE — Telephone Encounter (Signed)
Done. Spoke with nursing home and they will call Dr. Oneida Alar.

## 2018-01-02 NOTE — Telephone Encounter (Signed)
Can you please call nursing home and advise that this pt is not to see [place] 2 weeks post op on 01/06/18 she did not have surgery with Dr. Sharol Given she had a right AKA with Dr. Oneida Alar and needs her post op to be with them.

## 2018-01-02 NOTE — Telephone Encounter (Signed)
Erin sw nurse to advise that vascular did the amputation and that they should follow up with them.

## 2018-01-04 ENCOUNTER — Encounter: Payer: Self-pay | Admitting: Internal Medicine

## 2018-01-04 NOTE — Progress Notes (Signed)
Location:  [place] of Service:  SNF (31) Provider:Wallis Spizzirri D Lavaun Greenfield MD    Patient Care Team: [name], MD as PCP - General (Internal Medicine) Carolan Clines, MD as Attending Physician (Urology) Charlies Silvers, MD as Consulting Physician (Allergy) Greg Cutter, LCSW as [place] [facility] (Licensed Clinical Social Worker)  Extended Emergency Contact Information Primary Emergency Contact: Mclure,Michael Address: [address]          [place], [place] [address] Johnnette Litter of [place] Phone: [phone] Relation: Spouse    Allergies: Cinnamon; Ciprofloxacin; Diovan [valsartan]; Food; Latex; Nitrofuran derivatives; Penicillins; Bactrim [sulfamethoxazole-trimethoprim]; Other; and Sulfa antibiotics  Chief Complaint  Patient presents with  . Acute Visit    HPI: Patient is 83 y.o. female who Is being seen for a care plan meeting. In attendance is social work, physical therapy, nursing, patient's husband and myself. Patient has questions about her medications and of course questions about prognosis and where patient may be living in the future. Patient is recent AKA  Past Medical History:  Diagnosis Date  . Allergy   . Anemia   . CKD (chronic kidney disease), stage II   . Fibromyalgia   . GERD (gastroesophageal reflux disease)   . Hiatal hernia   . Hypertension   . Hypothyroid   . Lymphedema    venous insufficency  . Osteoarthritis   . Peripheral vascular disease ([place])   . Ulcer of knee ([place])    right  . Urinary incontinence   . Varicose veins   . Venous insufficiency     Past Surgical History:  Procedure Laterality Date  . AMPUTATION Right 12/15/2017   Procedure: AMPUTATION ABOVE KNEE, RIGHT;  Surgeon: Elam Dutch, MD;  Location: [place];  Service: Vascular;  Laterality: Right;  . APPLICATION OF WOUND VAC Right 07/18/2017   Procedure: APPLICATION OF WOUND VAC;  Surgeon: [name], MD;  Location:  [place];  Service: Orthopedics;  Laterality: Right;  . CATARACT EXTRACTION W/ INTRAOCULAR LENS  IMPLANT, BILATERAL    . COLONOSCOPY    . I&D EXTREMITY Right 07/18/2017   Procedure: IRRIGATION AND DEBRIDEMENT RIGHT KNEE;  Surgeon: [name], MD;  Location: [place];  Service: Orthopedics;  Laterality: Right;  . I&D EXTREMITY Right 07/23/2017   Procedure: REPEAT IRRIGATION AND DEBRIDEMENT RIGHT KNEE;  Surgeon: [name], MD;  Location: [place];  Service: Orthopedics;  Laterality: Right;  . I&D EXTREMITY Right 11/28/2017   Procedure: IRRIGATION AND DEBRIDEMENT RIGHT KNEE , APPLY INSTILLATION VAC;  Surgeon: [name], MD;  Location: [place];  Service: Orthopedics;  Laterality: Right;  . I&D EXTREMITY Right 12/03/2017   Procedure: REPEAT IRRIGATION AND DEBRIDEMENT RIGHT KNEE AND APPLICATION OF A WOUND VAC.;  Surgeon: [name], MD;  Location: [place];  Service: Orthopedics;  Laterality: Right;  . KNEE ARTHROSCOPY Left    menisectomy  . MULTIPLE TOOTH EXTRACTIONS    . SKIN SPLIT GRAFT Right 07/25/2017   Procedure: Repeat Irrigation and Debridement Right Knee, Split Thickness Skin Graft;  Surgeon: [name], MD;  Location: [place];  Service: Orthopedics;  Laterality: Right;  . SKIN SPLIT GRAFT Right 12/05/2017   Procedure: SKIN GRAFT SPLIT THICKNESS WOUND KNEE, APPLY VAC;  Surgeon: [name], MD;  Location: [place];  Service: Orthopedics;  Laterality: Right;  . TONSILLECTOMY      Allergies as of 12/30/2017      Reactions   Cinnamon  Hives   Ciprofloxacin Other (See Comments)   TREMORS   Diovan [valsartan] Other (See Comments)   Extreme vertigo   Food Diarrhea, Other (See Comments)   ORANGE JUICE   UPSET STOMACH   Latex Rash, Other (See Comments)   Rash/inflammation due to exposure   Nitrofuran Derivatives Hives, Rash   "Full body rash"   Penicillins Hives, Swelling   *tolerated Ceftriaxone September 2018 Has patient had a PCN reaction causing immediate rash, facial/tongue/throat  swelling, SOB or lightheadedness with hypotension:No--severe irritation at the injection site Has patient had a PCN reaction causing severe rash involving mucus membranes or skin necrosis:Unknown Has patient had a PCN reaction that required hospitalization:No Has patient had a PCN reaction occurring within the last 10 years:Yes If all of the above answers are "NO", then may proceed with   Bactrim [sulfamethoxazole-trimethoprim] Diarrhea, Nausea Only   Other Rash   Mycins, Strawberry, Oranges   Sulfa Antibiotics Diarrhea, Nausea Only      Medication List        Accurate as of 12/30/17 11:59 PM. Always use your most recent med list.          aspirin EC 81 MG tablet Take 81 mg by mouth daily with breakfast.   benazepril 40 MG tablet Commonly known as:  LOTENSIN Take 40 mg by mouth daily with breakfast.   CALCIUM 600+D 600-400 MG-UNIT tablet Generic drug:  Calcium Carbonate-Vitamin D Take 1 tablet by mouth daily at 3 pm.   cetirizine 10 MG tablet Commonly known as:  ZYRTEC Take 10 mg by mouth at bedtime.   diltiazem 120 MG 24 hr capsule Commonly known as:  TIAZAC Take 120 mg by mouth daily with breakfast.   docusate sodium 100 MG capsule Commonly known as:  COLACE Take 1 capsule (100 mg total) by mouth daily.   famotidine 40 MG tablet Commonly known as:  PEPCID Take 40 mg by mouth daily at 3 pm. 1600   feeding supplement (ENSURE ENLIVE) Liqd Take 237 mLs by mouth 3 (three) times daily with meals.   nutrition supplement (JUVEN) Pack Take 1 packet by mouth 2 (two) times daily between meals.   ferrous sulfate 325 (65 FE) MG tablet Take 325 mg by mouth 2 (two) times daily with a meal.   hydrALAZINE 25 MG tablet Commonly known as:  APRESOLINE Take 1 tablet (25 mg total) by mouth every 8 (eight) hours.   multivitamin with minerals Tabs tablet Take 1 tablet by mouth daily.   oxyCODONE-acetaminophen 5-325 MG tablet Commonly known as:  PERCOCET/ROXICET Take 1 tablet  by mouth every 4 (four) hours. Scheduled, and 1 by mouth every 6 hours as needed for pain x 14 days starting 12/11/17   pantoprazole 40 MG tablet Commonly known as:  PROTONIX Take 1 tablet (40 mg total) by mouth daily.   polyethylene glycol packet Commonly known as:  MIRALAX / GLYCOLAX Take 17 g by mouth 2 (two) times daily.   senna 8.6 MG Tabs tablet Commonly known as:  SENOKOT Take 2 tablets (17.2 mg total) by mouth daily.   SYNTHROID 175 MCG tablet Generic drug:  levothyroxine Take 175 mcg by mouth daily before breakfast.   vitamin C 500 MG tablet Commonly known as:  ASCORBIC ACID Take 500 mg by mouth daily.       No orders of the defined types were placed in this encounter.   Immunization History  Administered Date(s) Administered  . Influenza-Unspecified 12/08/2017  . Tdap 06/28/2015  Social History   Tobacco Use  . Smoking status: Former Smoker    Packs/day: 0.50    Years: 10.00    Pack years: 5.00    Last attempt to quit: 11/19/1979    Years since quitting: 38.1  . Smokeless tobacco: Never Used  Substance Use Topics  . Alcohol use: Yes    Alcohol/week: 8.4 oz    Types: 14 Glasses of wine per week    Comment: 2 glasses of wine with dinner    Review of Systems  DATA OBTAINED: from patient, nurse GENERAL:  no fevers, fatigue, appetite changes SKIN: No itching, rash HEENT: No complaint RESPIRATORY: No cough, wheezing, SOB CARDIAC: No chest pain, palpitations, lower extremity edema  GI: No abdominal pain, No N/V/D or constipation, No heartburn or reflux  GU: No dysuria, frequency or urgency, or incontinence  MUSCULOSKELETAL: No unrelieved bone/joint pain NEUROLOGIC: No headache, dizziness  PSYCHIATRIC: No overt anxiety/ +sadness  Vitals:   01/04/18 1349  BP: (!) 144/62  Pulse: 84  Resp: 20  Temp: 98 F (36.7 C)   There is no height or weight on file to calculate BMI. Physical Exam  GENERAL APPEARANCE: Alert, conversant, No acute distress,  occasionally tearful  SKIN: No diaphoresis rash HEENT: Unremarkable RESPIRATORY: Breathing is even, unlabored. Lung sounds are clear   CARDIOVASCULAR: Heart RRR no murmurs, rubs or gallops. No peripheral edema  GASTROINTESTINAL: Abdomen is soft, non-tender, not distended w/ normal bowel sounds.  GENITOURINARY: Bladder non tender, not distended  MUSCULOSKELETAL right AKA; dressing left heel  NEUROLOGIC: Cranial nerves 2-12 grossly intact. Moves all extremities PSYCHIATRIC: Mood and affect appropriate to situation, no behavioral issues  Patient Active Problem List   Diagnosis Date Noted  . 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 Peripheral vascular disease of lower extremity with ulceration ([place]) 12/27/2017  . Acute renal failure superimposed on stage 2 chronic kidney disease ([place]) 12/27/2017  . GERD (gastroesophageal reflux disease) 12/27/2017  . Acute blood loss as cause of postoperative anemia 12/13/2017  . Wound of right leg, sequela 12/12/2017  . Open wound of right knee 11/28/2017  . Allergic drug rash   . Sepsis ([place]) 08/29/2017  . CKD (chronic kidney disease), stage III ([place]) 08/29/2017  . Hypothyroid 08/29/2017  . Hypertension 08/29/2017  . Peripheral vascular disease ([place]) 08/29/2017  . Skin ulcer of knee, right, with fat layer exposed ([place]) 08/29/2017  . Acute kidney injury ([place]) 08/29/2017  . Metabolic acidosis 11/20/7251  . Acute hyponatremia 08/29/2017  . Pressure injury of skin 07/25/2017  . Wound, open, knee, lower leg, or ankle with complication, right, initial encounter   . Idiopathic chronic venous hypertension of both lower extremities with ulcer and inflammation ([place]) 07/15/2017  . Skin ulcer of right knee with necrosis of muscle ([place]) 07/15/2017  . Atherosclerosis of artery of right lower extremity ([place]) 07/11/2017  . Essential hypertension 07/11/2017  . Osteoporosis 07/11/2017  . Cataracts, bilateral 07/11/2017  .  Hypothyroidism, acquired 12/27/2014  . Iron deficiency anemia 12/27/2014  . Hiatal hernia 12/27/2014  . Varicose veins of lower extremities with other complications 66/44/0347    CMP     Component Value Date/Time   NA 136 12/22/2017 0843   NA 133 (A) 12/11/2017   NA 140 04/15/2013 1516   K 3.3 (L) 12/22/2017 0843   K 4.3 04/15/2013 1516   CL 99 (L) 12/22/2017 0843   CL 105 04/15/2013 1516   CO2 23 12/22/2017 0843  CO2 25 04/15/2013 1516   GLUCOSE 85 12/22/2017 0843   GLUCOSE 94 04/15/2013 1516   BUN 10 12/22/2017 0843   BUN 16 12/11/2017   BUN 28.2 (H) 04/15/2013 1516   CREATININE 0.93 12/22/2017 0843   CREATININE 1.21 (H) 12/25/2014 1530   CREATININE 1.4 (H) 04/15/2013 1516   CALCIUM 8.6 (L) 12/22/2017 0843   CALCIUM 9.9 04/15/2013 1516   PROT 5.7 (L) 12/17/2017 0533   PROT 7.8 04/15/2013 1516   ALBUMIN 2.0 (L) 12/22/2017 0843   ALBUMIN 3.7 04/15/2013 1516   AST 21 12/17/2017 0533   AST 17 04/15/2013 1516   ALT 13 (L) 12/17/2017 0533   ALT 15 04/15/2013 1516   ALKPHOS 65 12/17/2017 0533   ALKPHOS 73 04/15/2013 1516   BILITOT 0.7 12/17/2017 0533   BILITOT 0.25 04/15/2013 1516   GFRNONAA 57 (L) 12/22/2017 0843   GFRNONAA 43 (L) 12/25/2014 1530   GFRAA >60 12/22/2017 0843   GFRAA 50 (L) 12/25/2014 1530   Recent Labs    08/29/17 1756  12/19/17 0542 12/20/17 0503 12/22/17 0843  NA  --    < > 133* 133* 136  K  --    < > 3.6 3.6 3.3*  CL  --    < > 102 102 99*  CO2  --    < > 23 22 23   GLUCOSE  --    < > 92 105* 85  BUN  --    < > 18 16 10   CREATININE  --    < > 1.04* 1.00 0.93  CALCIUM  --    < > 8.1* 8.1* 8.6*  MG 1.8  --   --   --   --   PHOS  --   --   --   --  2.8   < > = values in this interval not displayed.   Recent Labs    12/12/17 1527  12/15/17 0716 12/17/17 0533 12/22/17 0843  AST 18  --  18 21  --   ALT 13*  --  14 13*  --   ALKPHOS 69  --  70 65  --   BILITOT 0.2*  --  0.6 0.7  --   PROT 6.3*  --  6.3* 5.7*  --   ALBUMIN 2.5*   < >  2.4* 2.0* 2.0*   < > = values in this interval not displayed.   Recent Labs    12/11/17 12/12/17 1527  12/19/17 0542 12/20/17 0503 12/22/17 0843  WBC 13.3 14.5*   < > 11.4* 10.3 13.5*  NEUTROABS 10 11.0*  --   --   --  10.5*  HGB 7.2* 6.7*   < > 7.2* 7.3* 10.5*  HCT 23* 21.9*   < > 24.0* 23.3* 33.6*  MCV  --  79.6   < > 83.6 83.5 82.0  PLT 498* 459*   < > 394 395 505*   < > = values in this interval not displayed.   No results for input(s): CHOL, LDLCALC, TRIG in the last 8760 hours.  Invalid input(s): HCL No results found for: MICROALBUR Lab Results  Component Value Date   TSH 0.298 (L) 09/09/2017   Lab Results  Component Value Date   HGBA1C 5.6 12/12/2017   No results found for: CHOL, HDL, LDLCALC, LDLDIRECT, TRIG, CHOLHDL  Significant Diagnostic Results in last 30 days:    Assessment and PlAN  Encounter for family conference with patient present/status post right AKA-in attendance  R patient's husband, social work, physical therapy, nursing, and myself. Patient had questions about medications, about what her capabilities for physical therapy are and will be and discussed with [place] or plans for selling her house and hopefully going to assisted living facility with her husband on discharge; of note patient was was going to start a meeting by discussing her possible hospice status. All questions and concerns were answered and covered. I expect future, conversations with patient one-on-one about all these issues.   Time spent greater than 45 minutes ;> 50% of time with patient was spent reviewing records, labs, tests and studies, counseling and developing plan of care  Inocencio Homes, MD

## 2018-01-04 NOTE — Progress Notes (Signed)
Location:   [facility] of Service:   skilled nursing facility Provider: Hennie Duos MD   Patient Care Team: [name], MD as PCP - General (Internal Medicine) Carolan Clines, MD as Attending Physician (Urology) Charlies Silvers, MD as Consulting Physician (Allergy) Greg Cutter, LCSW as [place] Management (Licensed Clinical Social Worker)  Extended Emergency Contact Information Primary Emergency Contact: Valone,Michael Address: [address]          [place], [place] [address] Johnnette Litter of [place] Phone: [phone] Relation: Spouse    Allergies: Cinnamon; Ciprofloxacin; Diovan [valsartan]; Food; Latex; Nitrofuran derivatives; Penicillins; Bactrim [sulfamethoxazole-trimethoprim]; Other; and Sulfa antibiotics  Chief Complaint  Patient presents with  . Acute Visit    HPI: Patient is 83 y.o. female who who nursing asked me to see at the end of the day because they had noted some drainage from her stump incision. Patient does admit to increased pain today. There is some mild redness around staples and some bloody drainage from between the staples but has no odor. Incision area is much more tender to touch than prior. There is noticeable heat patient without fever/chills, nausea or vomiting. Patient is concerned because she's been incontinent of urine, and has a prior nurse she is worried about infecting or already have the infected her wound.   Past Medical History:  Diagnosis Date  . Allergy   . Anemia   . CKD (chronic kidney disease), stage II   . Fibromyalgia   . GERD (gastroesophageal reflux disease)   . Hiatal hernia   . Hypertension   . Hypothyroid   . Lymphedema    venous insufficency  . Osteoarthritis   . Peripheral vascular disease ([place])   . Ulcer of knee ([place])    right  . Urinary incontinence   . Varicose veins   . Venous insufficiency     Past Surgical History:  Procedure Laterality Date  . AMPUTATION  Right 12/15/2017   Procedure: AMPUTATION ABOVE KNEE, RIGHT;  Surgeon: Elam Dutch, MD;  Location: [place];  Service: Vascular;  Laterality: Right;  . APPLICATION OF WOUND VAC Right 07/18/2017   Procedure: APPLICATION OF WOUND VAC;  Surgeon: [name], MD;  Location: [place];  Service: Orthopedics;  Laterality: Right;  . CATARACT EXTRACTION W/ INTRAOCULAR LENS  IMPLANT, BILATERAL    . COLONOSCOPY    . I&D EXTREMITY Right 07/18/2017   Procedure: IRRIGATION AND DEBRIDEMENT RIGHT KNEE;  Surgeon: [name], MD;  Location: [place];  Service: Orthopedics;  Laterality: Right;  . I&D EXTREMITY Right 07/23/2017   Procedure: REPEAT IRRIGATION AND DEBRIDEMENT RIGHT KNEE;  Surgeon: [name], MD;  Location: [place];  Service: Orthopedics;  Laterality: Right;  . I&D EXTREMITY Right 11/28/2017   Procedure: IRRIGATION AND DEBRIDEMENT RIGHT KNEE , APPLY INSTILLATION VAC;  Surgeon: [name], MD;  Location: [place];  Service: Orthopedics;  Laterality: Right;  . I&D EXTREMITY Right 12/03/2017   Procedure: REPEAT IRRIGATION AND DEBRIDEMENT RIGHT KNEE AND APPLICATION OF A WOUND VAC.;  Surgeon: [name], MD;  Location: [place];  Service: Orthopedics;  Laterality: Right;  . KNEE ARTHROSCOPY Left    menisectomy  . MULTIPLE TOOTH EXTRACTIONS    . SKIN SPLIT GRAFT Right 07/25/2017   Procedure: Repeat Irrigation and Debridement Right Knee, Split Thickness Skin Graft;  Surgeon: [name], MD;  Location: [place];  Service: Orthopedics;  Laterality: Right;  . SKIN SPLIT GRAFT  Right 12/05/2017   Procedure: SKIN GRAFT SPLIT THICKNESS WOUND KNEE, APPLY VAC;  Surgeon: [name], MD;  Location: [place];  Service: Orthopedics;  Laterality: Right;  . TONSILLECTOMY      Allergies as of 01/02/2018      Reactions   Cinnamon Hives   Ciprofloxacin Other (See Comments)   TREMORS   Diovan [valsartan] Other (See Comments)   Extreme vertigo   Food Diarrhea, Other (See Comments)   ORANGE JUICE   UPSET STOMACH    Latex Rash, Other (See Comments)   Rash/inflammation due to exposure   Nitrofuran Derivatives Hives, Rash   "Full body rash"   Penicillins Hives, Swelling   *tolerated Ceftriaxone September 2018 Has patient had a PCN reaction causing immediate rash, facial/tongue/throat swelling, SOB or lightheadedness with hypotension:No--severe irritation at the injection site Has patient had a PCN reaction causing severe rash involving mucus membranes or skin necrosis:Unknown Has patient had a PCN reaction that required hospitalization:No Has patient had a PCN reaction occurring within the last 10 years:Yes If all of the above answers are "NO", then may proceed with   Bactrim [sulfamethoxazole-trimethoprim] Diarrhea, Nausea Only   Other Rash   Mycins, Strawberry, Oranges   Sulfa Antibiotics Diarrhea, Nausea Only      Medication List        Accurate as of 01/02/18 11:59 PM. Always use your most recent med list.          aspirin EC 81 MG tablet Take 81 mg by mouth daily with breakfast.   benazepril 40 MG tablet Commonly known as:  LOTENSIN Take 40 mg by mouth daily with breakfast.   CALCIUM 600+D 600-400 MG-UNIT tablet Generic drug:  Calcium Carbonate-Vitamin D Take 1 tablet by mouth daily at 3 pm.   cetirizine 10 MG tablet Commonly known as:  ZYRTEC Take 10 mg by mouth at bedtime.   diltiazem 120 MG 24 hr capsule Commonly known as:  TIAZAC Take 120 mg by mouth daily with breakfast.   docusate sodium 100 MG capsule Commonly known as:  COLACE Take 1 capsule (100 mg total) by mouth daily.   famotidine 40 MG tablet Commonly known as:  PEPCID Take 40 mg by mouth daily at 3 pm. 1600   feeding supplement (ENSURE ENLIVE) Liqd Take 237 mLs by mouth 3 (three) times daily with meals.   nutrition supplement (JUVEN) Pack Take 1 packet by mouth 2 (two) times daily between meals.   ferrous sulfate 325 (65 FE) MG tablet Take 325 mg by mouth 2 (two) times daily with a meal.   hydrALAZINE  25 MG tablet Commonly known as:  APRESOLINE Take 1 tablet (25 mg total) by mouth every 8 (eight) hours.   multivitamin with minerals Tabs tablet Take 1 tablet by mouth daily.   oxyCODONE-acetaminophen 5-325 MG tablet Commonly known as:  PERCOCET/ROXICET Take 1 tablet by mouth every 4 (four) hours. Scheduled, and 1 by mouth every 6 hours as needed for pain x 14 days starting 12/11/17   pantoprazole 40 MG tablet Commonly known as:  PROTONIX Take 1 tablet (40 mg total) by mouth daily.   polyethylene glycol packet Commonly known as:  MIRALAX / GLYCOLAX Take 17 g by mouth 2 (two) times daily.   senna 8.6 MG Tabs tablet Commonly known as:  SENOKOT Take 2 tablets (17.2 mg total) by mouth daily.   SYNTHROID 175 MCG tablet Generic drug:  levothyroxine Take 175 mcg by mouth daily before breakfast.   vitamin C 500  MG tablet Commonly known as:  ASCORBIC ACID Take 500 mg by mouth daily.       No orders of the defined types were placed in this encounter.   Immunization History  Administered Date(s) Administered  . Influenza-Unspecified 12/08/2017  . Tdap 06/28/2015    Social History   Tobacco Use  . Smoking status: Former Smoker    Packs/day: 0.50    Years: 10.00    Pack years: 5.00    Last attempt to quit: 11/19/1979    Years since quitting: 38.1  . Smokeless tobacco: Never Used  Substance Use Topics  . Alcohol use: Yes    Alcohol/week: 8.4 oz    Types: 14 Glasses of wine per week    Comment: 2 glasses of wine with dinner    Review of Systems  DATA OBTAINED: from patient, nurse GENERAL:  no fevers, fatigue, appetite changes SKIN: No redness, bloody drainage from incision site of stump HEENT: No complaint RESPIRATORY: No cough, wheezing, SOB CARDIAC: No chest pain, palpitations, lower extremity edema  GI: No abdominal pain, No N/V/D or constipation, No heartburn or reflux  GU: No dysuria, frequency or urgency, or incontinence  MUSCULOSKELETAL: No unrelieved  bone/joint pain NEUROLOGIC: No headache, dizziness  PSYCHIATRIC: No overt anxiety or sadness  Vitals:   01/04/18 1827  BP: (!) 137/57  Pulse: 82  Resp: (!) 22  Temp: 98.4 F (36.9 C)   There is no height or weight on file to calculate BMI. Physical Exam  GENERAL APPEARANCE: Alert, conversant, No acute distress  SKIN: Incision site with staples in place, mild redness to a lot of incision site, which is tender to palpation, overall feeling of swelling to stomach, bloody drainage with no odor from between 1 area of staples HEENT: Unremarkable RESPIRATORY: Breathing is even, unlabored. Lung sounds are clear   CARDIOVASCULAR: Heart RRR no murmurs, rubs or gallops. Some nonpitting peripheral edema  GASTROINTESTINAL: Abdomen is soft, non-tender, not distended w/ normal bowel sounds.  GENITOURINARY: Bladder non tender, not distended  MUSCULOSKELETAL: Right AKA who NEUROLOGIC: Cranial nerves 2-12 grossly intact. Moves all extremities PSYCHIATRIC: Mood and affect appropriate to situation, no behavioral issues  Patient Active Problem List   Diagnosis Date Noted  . Chronic ulcer of right heel with necrosis of bone ([place]) 12/27/2017  . Peripheral vascular disease of lower extremity with ulceration ([place]) 12/27/2017  . Acute renal failure superimposed on stage 2 chronic kidney disease ([place]) 12/27/2017  . GERD (gastroesophageal reflux disease) 12/27/2017  . Acute blood loss as cause of postoperative anemia 12/13/2017  . Wound of right leg, sequela 12/12/2017  . Open wound of right knee 11/28/2017  . Allergic drug rash   . Sepsis ([place]) 08/29/2017  . CKD (chronic kidney disease), stage III ([place]) 08/29/2017  . Hypothyroid 08/29/2017  . Hypertension 08/29/2017  . Peripheral vascular disease ([place]) 08/29/2017  . Skin ulcer of knee, right, with fat layer exposed ([place]) 08/29/2017  . Acute kidney injury ([place]) 08/29/2017  . Metabolic acidosis 16/08/9603  . Acute hyponatremia 08/29/2017  . Pressure  injury of skin 07/25/2017  . Wound, open, knee, lower leg, or ankle with complication, right, initial encounter   . Idiopathic chronic venous hypertension of both lower extremities with ulcer and inflammation ([place]) 07/15/2017  . Skin ulcer of right knee with necrosis of muscle ([place]) 07/15/2017  . Atherosclerosis of artery of right lower extremity ([place]) 07/11/2017  . Essential hypertension 07/11/2017  . Osteoporosis 07/11/2017  . Cataracts, bilateral 07/11/2017  . Hypothyroidism,  acquired 12/27/2014  . Iron deficiency anemia 12/27/2014  . Hiatal hernia 12/27/2014  . Varicose veins of lower extremities with other complications 24/58/0998    CMP     Component Value Date/Time   NA 136 12/22/2017 0843   NA 133 (A) 12/11/2017   NA 140 04/15/2013 1516   K 3.3 (L) 12/22/2017 0843   K 4.3 04/15/2013 1516   CL 99 (L) 12/22/2017 0843   CL 105 04/15/2013 1516   CO2 23 12/22/2017 0843   CO2 25 04/15/2013 1516   GLUCOSE 85 12/22/2017 0843   GLUCOSE 94 04/15/2013 1516   BUN 10 12/22/2017 0843   BUN 16 12/11/2017   BUN 28.2 (H) 04/15/2013 1516   CREATININE 0.93 12/22/2017 0843   CREATININE 1.21 (H) 12/25/2014 1530   CREATININE 1.4 (H) 04/15/2013 1516   CALCIUM 8.6 (L) 12/22/2017 0843   CALCIUM 9.9 04/15/2013 1516   PROT 5.7 (L) 12/17/2017 0533   PROT 7.8 04/15/2013 1516   ALBUMIN 2.0 (L) 12/22/2017 0843   ALBUMIN 3.7 04/15/2013 1516   AST 21 12/17/2017 0533   AST 17 04/15/2013 1516   ALT 13 (L) 12/17/2017 0533   ALT 15 04/15/2013 1516   ALKPHOS 65 12/17/2017 0533   ALKPHOS 73 04/15/2013 1516   BILITOT 0.7 12/17/2017 0533   BILITOT 0.25 04/15/2013 1516   GFRNONAA 57 (L) 12/22/2017 0843   GFRNONAA 43 (L) 12/25/2014 1530   GFRAA >60 12/22/2017 0843   GFRAA 50 (L) 12/25/2014 1530   Recent Labs    08/29/17 1756  12/19/17 0542 12/20/17 0503 12/22/17 0843  NA  --    < > 133* 133* 136  K  --    < > 3.6 3.6 3.3*  CL  --    < > 102 102 99*  CO2  --    < > 23 22 23   GLUCOSE  --     < > 92 105* 85  BUN  --    < > 18 16 10   CREATININE  --    < > 1.04* 1.00 0.93  CALCIUM  --    < > 8.1* 8.1* 8.6*  MG 1.8  --   --   --   --   PHOS  --   --   --   --  2.8   < > = values in this interval not displayed.   Recent Labs    12/12/17 1527  12/15/17 0716 12/17/17 0533 12/22/17 0843  AST 18  --  18 21  --   ALT 13*  --  14 13*  --   ALKPHOS 69  --  70 65  --   BILITOT 0.2*  --  0.6 0.7  --   PROT 6.3*  --  6.3* 5.7*  --   ALBUMIN 2.5*   < > 2.4* 2.0* 2.0*   < > = values in this interval not displayed.   Recent Labs    12/11/17 12/12/17 1527  12/19/17 0542 12/20/17 0503 12/22/17 0843  WBC 13.3 14.5*   < > 11.4* 10.3 13.5*  NEUTROABS 10 11.0*  --   --   --  10.5*  HGB 7.2* 6.7*   < > 7.2* 7.3* 10.5*  HCT 23* 21.9*   < > 24.0* 23.3* 33.6*  MCV  --  79.6   < > 83.6 83.5 82.0  PLT 498* 459*   < > 394 395 505*   < > = values in this interval not  displayed.   No results for input(s): CHOL, LDLCALC, TRIG in the last 8760 hours.  Invalid input(s): HCL No results found for: MICROALBUR Lab Results  Component Value Date   TSH 0.298 (L) 09/09/2017   Lab Results  Component Value Date   HGBA1C 5.6 12/12/2017   No results found for: CHOL, HDL, LDLCALC, LDLDIRECT, TRIG, CHOLHDL  Significant Diagnostic Results in last 30 days:   Assessment and Plan  Post op Wound infection-drainage of old blood is concerning; patient's vascular surgeon was called in and said they will see patient on Monday; patient may make it until Monday; will start Keflex 500 mg 4 times a day; will start Lasix 20 mg daily; will visualize one daily; patient to follow-up with vascular surgeons on Monday; blood pressure for sending patient to ED for fever, increased symptoms  Urinary incontinence-patient is extremely concerned about her urinary continence in relation to the proximity of her wound; Place Foley to straight drain     Inocencio Homes, MD

## 2018-01-05 ENCOUNTER — Other Ambulatory Visit: Payer: Self-pay | Admitting: Licensed Clinical Social Worker

## 2018-01-05 ENCOUNTER — Other Ambulatory Visit: Payer: Self-pay | Admitting: *Deleted

## 2018-01-05 ENCOUNTER — Ambulatory Visit (INDEPENDENT_AMBULATORY_CARE_PROVIDER_SITE_OTHER): Payer: Medicare Other | Admitting: Family

## 2018-01-05 ENCOUNTER — Encounter: Payer: Self-pay | Admitting: Family

## 2018-01-05 VITALS — BP 127/56 | HR 88 | Temp 98.5°F | Resp 18 | Ht 64.0 in | Wt 227.0 lb

## 2018-01-05 DIAGNOSIS — I779 Disorder of arteries and arterioles, unspecified: Secondary | ICD-10-CM

## 2018-01-05 DIAGNOSIS — Z89611 Acquired absence of right leg above knee: Secondary | ICD-10-CM

## 2018-01-05 NOTE — Patient Outreach (Signed)
[facility]) Care Management  01/05/2018  Shelby Reese [birth year] [id number]  Assessment- CSW received update from [facility] that patient contacted Hospice today and self-referred herself this morning. SNF SW was unaware of patient's desired wishes to engage in Hospice. [facility] Post Acute Coordinator unable to to meet with patient today.   Plan-CSW will complete SNF visit this week to follow up on this.  [name], BSW, MSW, [email or web address] Phone: [phone] Fax: [phone]

## 2018-01-05 NOTE — Progress Notes (Signed)
Postoperative Visit   History of Present Illness  Shelby Reese is a 83 y.o. female who is s/p right AKA on 12-15-17 by Dr. Oneida Alar.   Pt was seen by her PCP at [facility] on 01-02-18 with dark bloody drainage from stump wound. Pt was started on Keflex 500 mg 4x/day and Lasix 20 mg qd.  Staples are in place in the right AKA. Pt denies fever or chills, denies pain.  Pt has a foley catheter in place.   No follow up beyond today is scheduled with Dr. Oneida Alar.   Prior to this surgery pt was seen in 2014 by Dr. Trula Slade for varicose veins; compression stocking therapy was advised.  Pt was not seen by our practice again until the right AKA on 12-15-17.   Pt is a retired Marine scientist.    For VQI Use Only  PRE-ADM LIVING: Nursing home  AMB STATUS: Wheelchair   Past Medical History:  Diagnosis Date  . Allergy   . Anemia   . CKD (chronic kidney disease), stage II   . Fibromyalgia   . GERD (gastroesophageal reflux disease)   . Hiatal hernia   . Hypertension   . Hypothyroid   . Lymphedema    venous insufficency  . Osteoarthritis   . Peripheral vascular disease ([place])   . Ulcer of knee ([place])    right  . Urinary incontinence   . Varicose veins   . Venous insufficiency     Past Surgical History:  Procedure Laterality Date  . AMPUTATION Right 12/15/2017   Procedure: AMPUTATION ABOVE KNEE, RIGHT;  Surgeon: Elam Dutch, MD;  Location: [place];  Service: Vascular;  Laterality: Right;  . APPLICATION OF WOUND VAC Right 07/18/2017   Procedure: APPLICATION OF WOUND VAC;  Surgeon: [name], MD;  Location: [place];  Service: Orthopedics;  Laterality: Right;  . CATARACT EXTRACTION W/ INTRAOCULAR LENS  IMPLANT, BILATERAL    . COLONOSCOPY    . I&D EXTREMITY Right 07/18/2017   Procedure: IRRIGATION AND DEBRIDEMENT RIGHT KNEE;  Surgeon: [name], MD;  Location: [place];  Service: Orthopedics;  Laterality: Right;  . I&D EXTREMITY Right 07/23/2017   Procedure: REPEAT IRRIGATION AND  DEBRIDEMENT RIGHT KNEE;  Surgeon: [name], MD;  Location: [place];  Service: Orthopedics;  Laterality: Right;  . I&D EXTREMITY Right 11/28/2017   Procedure: IRRIGATION AND DEBRIDEMENT RIGHT KNEE , APPLY INSTILLATION VAC;  Surgeon: [name], MD;  Location: [place];  Service: Orthopedics;  Laterality: Right;  . I&D EXTREMITY Right 12/03/2017   Procedure: REPEAT IRRIGATION AND DEBRIDEMENT RIGHT KNEE AND APPLICATION OF A WOUND VAC.;  Surgeon: [name], MD;  Location: [place];  Service: Orthopedics;  Laterality: Right;  . KNEE ARTHROSCOPY Left    menisectomy  . MULTIPLE TOOTH EXTRACTIONS    . SKIN SPLIT GRAFT Right 07/25/2017   Procedure: Repeat Irrigation and Debridement Right Knee, Split Thickness Skin Graft;  Surgeon: [name], MD;  Location: [place];  Service: Orthopedics;  Laterality: Right;  . SKIN SPLIT GRAFT Right 12/05/2017   Procedure: SKIN GRAFT SPLIT THICKNESS WOUND KNEE, APPLY VAC;  Surgeon: [name], MD;  Location: [place];  Service: Orthopedics;  Laterality: Right;  . TONSILLECTOMY      Social History   Socioeconomic History  . Marital status: Married    Spouse name: Not on file  . Number of children: 0  . Years of education: Not on file  .  Highest education level: Not on file  Social Needs  . Financial resource strain: Not on file  . Food insecurity - worry: Not on file  . Food insecurity - inability: Not on file  . Transportation needs - medical: Not on file  . Transportation needs - non-medical: Not on file  Occupational History    Comment: retired Marine scientist.   Tobacco Use  . Smoking status: Former Smoker    Packs/day: 0.50    Years: 10.00    Pack years: 5.00    Last attempt to quit: 11/19/1979    Years since quitting: 38.1  . Smokeless tobacco: Never Used  Substance and Sexual Activity  . Alcohol use: Yes    Alcohol/week: 8.4 oz    Types: 14 Glasses of wine per week    Comment: 2 glasses of wine with dinner  . Drug use: No  . Sexual activity: Not  on file  Other Topics Concern  . Not on file  Social History Narrative  . Not on file    Allergies  Allergen Reactions  . Cinnamon Hives  . Ciprofloxacin Other (See Comments)    TREMORS  . Diovan [Valsartan] Other (See Comments)    Extreme vertigo  . Food Diarrhea and Other (See Comments)    ORANGE JUICE   UPSET STOMACH  . Latex Rash and Other (See Comments)    Rash/inflammation due to exposure  . Nitrofuran Derivatives Hives and Rash    "Full body rash"  . Penicillins Hives and Swelling    *tolerated Ceftriaxone September 2018 Has patient had a PCN reaction causing immediate rash, facial/tongue/throat swelling, SOB or lightheadedness with hypotension:No--severe irritation at the injection site Has patient had a PCN reaction causing severe rash involving mucus membranes or skin necrosis:Unknown Has patient had a PCN reaction that required hospitalization:No Has patient had a PCN reaction occurring within the last 10 years:Yes If all of the above answers are "NO", then may proceed with  . Bactrim [Sulfamethoxazole-Trimethoprim] Diarrhea and Nausea Only  . Other Rash    Mycins, Strawberry, Oranges  . Sulfa Antibiotics Diarrhea and Nausea Only    Current Outpatient Medications on File Prior to Visit  Medication Sig Dispense Refill  . aspirin EC 81 MG tablet Take 81 mg by mouth daily with breakfast.    . benazepril (LOTENSIN) 40 MG tablet Take 40 mg by mouth daily with breakfast.     . Calcium Carbonate-Vitamin D (CALCIUM 600+D) 600-400 MG-UNIT per tablet Take 1 tablet by mouth daily at 3 pm.     . cephALEXin (KEFLEX) 500 MG capsule Take 500 mg by mouth 4 (four) times daily.    . cetirizine (ZYRTEC) 10 MG tablet Take 10 mg by mouth at bedtime.     Marland Kitchen diltiazem (TIAZAC) 120 MG 24 hr capsule Take 120 mg by mouth daily with breakfast.     . docusate sodium (COLACE) 100 MG capsule Take 1 capsule (100 mg total) by mouth daily. 10 capsule 0  . famotidine (PEPCID) 40 MG tablet Take 40  mg by mouth daily at 3 pm. 1600    . feeding supplement, ENSURE ENLIVE, (ENSURE ENLIVE) LIQD Take 237 mLs by mouth 3 (three) times daily with meals. 237 mL 12  . ferrous sulfate 325 (65 FE) MG tablet Take 325 mg by mouth 2 (two) times daily with a meal.    . furosemide (LASIX) 20 MG tablet Take 20 mg by mouth.    . hydrALAZINE (APRESOLINE) 25 MG tablet Take 1  tablet (25 mg total) by mouth every 8 (eight) hours. 90 tablet 0  . methocarbamol (ROBAXIN) 500 MG tablet Take 500 mg by mouth every 6 (six) hours as needed for muscle spasms.    . Multiple Vitamin (MULTIVITAMIN WITH MINERALS) TABS tablet Take 1 tablet by mouth daily.    . nutrition supplement, JUVEN, (JUVEN) PACK Take 1 packet by mouth 2 (two) times daily between meals. 60 packet 0  . oxyCODONE-acetaminophen (PERCOCET/ROXICET) 5-325 MG tablet Take 1 tablet by mouth every 4 (four) hours. Scheduled, and 1 by mouth every 6 hours as needed for pain x 14 days starting 12/11/17    . pantoprazole (PROTONIX) 40 MG tablet Take 1 tablet (40 mg total) by mouth daily. 30 tablet 0  . polyethylene glycol (MIRALAX / GLYCOLAX) packet Take 17 g by mouth 2 (two) times daily. 14 each 0  . senna (SENOKOT) 8.6 MG TABS tablet Take 2 tablets (17.2 mg total) by mouth daily. 120 each 0  . SYNTHROID 175 MCG tablet Take 175 mcg by mouth daily before breakfast.     . vitamin C (ASCORBIC ACID) 500 MG tablet Take 500 mg by mouth daily.     No current facility-administered medications on file prior to visit.      Physical Examination  Vitals:   01/05/18 0952  BP: (!) 127/56  Pulse: 88  Resp: 18  Temp: 98.5 F (36.9 C)  TempSrc: Oral  SpO2: 95%  Weight: 227 lb (103 kg)  Height: 5\' 4"  (1.626 m)   Body mass index is 38.96 kg/m.   Right AKA stump    Medical Decision Making  TAMEYAH KOCH is a 83 y.o. female who is s/p right AKA on 12-15-17.   All staples removed today, steri strips applied. Incision cleansed, protective dressing applied.  It appears  there is some mild cellulitis at the medial portion of her right AKA stump incision surrounding tissue.  Scant bloody drainage from small portion of incision, no dehiscence of incision, no erythema.  Daily local wound care to right AKA incision. Continue Keflex 500 mg, either qid or tid, for 7 more days, follow up with Dr. Willodean Rosenthal for wound check in 2 weeks.    Clemon Chambers, RN, MSN, FNP-C [facility] Office: [phone]  01/05/2018, 10:34 AM  Clinic MD: Trula Slade

## 2018-01-05 NOTE — Patient Outreach (Signed)
[facility]) Care Management  01/05/2018  BERLENE DIXSON 28-Sep-1935 [id number]   While at facility, [place], [place] told this RNCM that she rec'd a call from hospice today that patient had self-referred herself to hospice this am.  She reports that the facility doctor is not there today and she plans to discuss this with Dr. Sheppard Coil tomorrow. She states she is not sure if patient would qualify for hospice or palliative care at this time, but will follow up with MD, patient and hospice regarding situation.  Attempted to meet with patient she was not in room or facility, SW thought she must of left for an appointment. Plan update [facility] LCSW that is following patient for needed follow up. Royetta Crochet. Laymond Purser, RN, BSN, [place] [phone]) Business Cell  [phone]) Toll Free Office

## 2018-01-05 NOTE — Patient Instructions (Addendum)
  To decrease swelling in your foot and leg: Elevate foot above slightly bent knee, foot above heart, overnight and 3-4 times per day for 20 minutes.    Peripheral Vascular Disease Peripheral vascular disease (PVD) is a disease of the blood vessels that are not part of your heart and brain. A simple term for PVD is poor circulation. In most cases, PVD narrows the blood vessels that carry blood from your heart to the rest of your body. This can result in a decreased supply of blood to your arms, legs, and internal organs, like your stomach or kidneys. However, it most often affects a person's lower legs and feet. There are two types of PVD.  Organic PVD. This is the more common type. It is caused by damage to the structure of blood vessels.  Functional PVD. This is caused by conditions that make blood vessels contract and tighten (spasm).  Without treatment, PVD tends to get worse over time. PVD can also lead to acute ischemic limb. This is when an arm or limb suddenly has trouble getting enough blood. This is a medical emergency. Follow these instructions at home:  Take medicines only as told by your doctor.  Do not use any tobacco products, including cigarettes, chewing tobacco, or electronic cigarettes. If you need help quitting, ask your doctor.  Lose weight if you are overweight, and maintain a healthy weight as told by your doctor.  Eat a diet that is low in fat and cholesterol. If you need help, ask your doctor.  Exercise regularly. Ask your doctor for some good activities for you.  Take good care of your feet. ? Wear comfortable shoes that fit well. ? Check your feet often for any cuts or sores. Contact a doctor if:  You have cramps in your legs while walking.  You have leg pain when you are at rest.  You have coldness in a leg or foot.  Your skin changes.  You are unable to get or have an erection (erectile dysfunction).  You have cuts or sores on your feet that are  not healing. Get help right away if:  Your arm or leg turns cold and blue.  Your arms or legs become red, warm, swollen, painful, or numb.  You have chest pain or trouble breathing.  You suddenly have weakness in your face, arm, or leg.  You become very confused or you cannot speak.  You suddenly have a very bad headache.  You suddenly cannot see. This information is not intended to replace advice given to you by your health care provider. Make sure you discuss any questions you have with your health care provider. Document Released: 01/29/2010 Document Revised: 04/11/2016 Document Reviewed: 04/14/2014 Elsevier Interactive Patient Education  2017 [employer].

## 2018-01-06 ENCOUNTER — Inpatient Hospital Stay (INDEPENDENT_AMBULATORY_CARE_PROVIDER_SITE_OTHER): Payer: Medicare Other | Admitting: Orthopedic Surgery

## 2018-01-06 ENCOUNTER — Other Ambulatory Visit: Payer: Self-pay | Admitting: Licensed Clinical Social Worker

## 2018-01-06 NOTE — Patient Outreach (Signed)
[facility]) Care Management  [facility] Social Work  01/06/2018  Shelby Reese [birth year] [id number]  Encounter Medications:  Outpatient Encounter Medications as of 01/06/2018  Medication Sig  . aspirin EC 81 MG tablet Take 81 mg by mouth daily with breakfast.  . benazepril (LOTENSIN) 40 MG tablet Take 40 mg by mouth daily with breakfast.   . Calcium Carbonate-Vitamin D (CALCIUM 600+D) 600-400 MG-UNIT per tablet Take 1 tablet by mouth daily at 3 pm.   . cephALEXin (KEFLEX) 500 MG capsule Take 500 mg by mouth 4 (four) times daily.  . cetirizine (ZYRTEC) 10 MG tablet Take 10 mg by mouth at bedtime.   Marland Kitchen diltiazem (TIAZAC) 120 MG 24 hr capsule Take 120 mg by mouth daily with breakfast.   . docusate sodium (COLACE) 100 MG capsule Take 1 capsule (100 mg total) by mouth daily.  . famotidine (PEPCID) 40 MG tablet Take 40 mg by mouth daily at 3 pm. 1600  . feeding supplement, ENSURE ENLIVE, (ENSURE ENLIVE) LIQD Take 237 mLs by mouth 3 (three) times daily with meals.  . ferrous sulfate 325 (65 FE) MG tablet Take 325 mg by mouth 2 (two) times daily with a meal.  . furosemide (LASIX) 20 MG tablet Take 20 mg by mouth.  . hydrALAZINE (APRESOLINE) 25 MG tablet Take 1 tablet (25 mg total) by mouth every 8 (eight) hours.  . methocarbamol (ROBAXIN) 500 MG tablet Take 500 mg by mouth every 6 (six) hours as needed for muscle spasms.  . Multiple Vitamin (MULTIVITAMIN WITH MINERALS) TABS tablet Take 1 tablet by mouth daily.  . nutrition supplement, JUVEN, (JUVEN) PACK Take 1 packet by mouth 2 (two) times daily between meals.  Marland Kitchen oxyCODONE-acetaminophen (PERCOCET/ROXICET) 5-325 MG tablet Take 1 tablet by mouth every 4 (four) hours. Scheduled, and 1 by mouth every 6 hours as needed for pain x 14 days starting 12/11/17  . pantoprazole (PROTONIX) 40 MG tablet Take 1 tablet (40 mg total) by mouth daily.  . polyethylene glycol (MIRALAX / GLYCOLAX) packet Take 17 g by mouth 2 (two) times daily.  Marland Kitchen senna  (SENOKOT) 8.6 MG TABS tablet Take 2 tablets (17.2 mg total) by mouth daily.  Marland Kitchen SYNTHROID 175 MCG tablet Take 175 mcg by mouth daily before breakfast.   . vitamin C (ASCORBIC ACID) 500 MG tablet Take 500 mg by mouth daily.   No facility-administered encounter medications on file as of 01/06/2018.     Functional Status:  In your present state of health, do you have any difficulty performing the following activities: 12/29/2017 12/12/2017  Hearing? N -  Vision? N -  Difficulty concentrating or making decisions? N -  Walking or climbing stairs? Y -  Dressing or bathing? Y -  Doing errands, shopping? N N  Some recent data might be hidden    Fall/Depression Screening:  PHQ 2/9 Scores 12/29/2017  PHQ - 2 Score 0    Assessment: CSW arrived at [facility] to complete SNF visit on 01/06/18. When CSW arrived to patient's room, patient immediately began crying. Patient reports "I'm just not getting better." Patient is aware that her spouse will discharge back home from SNF on 01/10/18 and she is VERY concerned about him being there alone. CSW was made aware yesterday that patient self-referred herself to Hospice. Patient reports that she did this "weeks ago." Patient is aware that she may only qualify for palliative care. Patient states "I have absolutely no support and I'm in constant pain.  I need help." Patient began crying again, complaining of pain. CSW called SNF CNA in but was informed they are unable to get patient anymore pain medication at this time as she is on a strict schedule. CSW provided emotional support and comfort to patient during home visit that patient was receptive to. Patient's main family support members are her sister and niece and they live in [place]. She reports that be in contact with them weekly though. Patient states that she feels that she may need LTC placement but cannot afford it. CSW discussed other options and again looked at personal care services.  Patient understands that she may need to hire an aide to help support her family once she discharges back home. Patient seems very overwhelmed at this time and CSW is concerned. CSW spoke with SNF social worker [name] and provided update.   Plan: CSW will follow up with SNF in one week and continue to follow case until SNF discharge.  [name], BSW, MSW, [email or web address] Phone: [phone] Fax: [phone]

## 2018-01-12 ENCOUNTER — Non-Acute Institutional Stay (SKILLED_NURSING_FACILITY): Payer: Medicare Other | Admitting: Internal Medicine

## 2018-01-12 ENCOUNTER — Encounter: Payer: Self-pay | Admitting: Internal Medicine

## 2018-01-12 ENCOUNTER — Other Ambulatory Visit: Payer: Self-pay | Admitting: Licensed Clinical Social Worker

## 2018-01-12 ENCOUNTER — Telehealth (INDEPENDENT_AMBULATORY_CARE_PROVIDER_SITE_OTHER): Payer: Self-pay

## 2018-01-12 DIAGNOSIS — Z89611 Acquired absence of right leg above knee: Secondary | ICD-10-CM

## 2018-01-12 DIAGNOSIS — F329 Major depressive disorder, single episode, unspecified: Secondary | ICD-10-CM | POA: Diagnosis not present

## 2018-01-12 DIAGNOSIS — L89621 Pressure ulcer of left heel, stage 1: Secondary | ICD-10-CM | POA: Diagnosis not present

## 2018-01-12 DIAGNOSIS — R52 Pain, unspecified: Secondary | ICD-10-CM

## 2018-01-12 DIAGNOSIS — Z7189 Other specified counseling: Secondary | ICD-10-CM

## 2018-01-12 DIAGNOSIS — K5903 Drug induced constipation: Secondary | ICD-10-CM | POA: Diagnosis not present

## 2018-01-12 DIAGNOSIS — I70245 Atherosclerosis of native arteries of left leg with ulceration of other part of foot: Secondary | ICD-10-CM | POA: Diagnosis not present

## 2018-01-12 NOTE — Progress Notes (Signed)
Location:  [place] Room Number: 101P Place of Service:  SNF (31) Noah Delaine. Sheppard Coil, MD  [name], MD  Patient Care Team: [name], MD as PCP - General (Internal Medicine) Carolan Clines, MD as Attending Physician (Urology) Charlies Silvers, MD as Consulting Physician (Allergy) Greg Cutter, LCSW as [place] Management (Licensed Clinical Social Worker)  Extended Emergency Contact Information Primary Emergency Contact: Weisensel,Michael Address: [address]          [place], [place] [address] Johnnette Litter of [place] Phone: [phone] Relation: Spouse    Allergies: Cinnamon; Ciprofloxacin; Diovan [valsartan]; Food; Latex; Nitrofuran derivatives; Penicillins; Bactrim [sulfamethoxazole-trimethoprim]; Other; and Sulfa antibiotics  Chief Complaint  Patient presents with  . Acute Visit    Depression    HPI: Patient is 83 y.o. female who is being seen for several different issues, all revolving around the loss of her right leg. The nurses report that she cries all the time, which I have seen myself and the patient reports that she is in pain constantly, despite a very healthy pain regimen. Patient does admit that she has leg spasms. Patient also complains of being constipated all the time. Patient is absolutely miserable in every way. She endorses multiple times that she wishes to die and be done with it.  Past Medical History:  Diagnosis Date  . Allergy   . Anemia   . CKD (chronic kidney disease), stage II   . Fibromyalgia   . GERD (gastroesophageal reflux disease)   . Hiatal hernia   . Hypertension   . Hypothyroid   . Lymphedema    venous insufficency  . Osteoarthritis   . Peripheral vascular disease ([place])   . Ulcer of knee ([place])    right  . Urinary incontinence   . Varicose veins   . Venous insufficiency     Past Surgical History:  Procedure Laterality Date  . AMPUTATION Right 12/15/2017   Procedure: AMPUTATION ABOVE KNEE, RIGHT;  Surgeon: Elam Dutch, MD;  Location: [place];  Service: Vascular;  Laterality: Right;  . APPLICATION OF WOUND VAC Right 07/18/2017   Procedure: APPLICATION OF WOUND VAC;  Surgeon: [name], MD;  Location: [facility] [place];  Service: Orthopedics;  Laterality: Right;  . CATARACT EXTRACTION W/ INTRAOCULAR LENS  IMPLANT, BILATERAL    . COLONOSCOPY    . I&D EXTREMITY Right 07/18/2017   Procedure: IRRIGATION AND DEBRIDEMENT RIGHT KNEE;  Surgeon: [name], MD;  Location: [facility];  Service: Orthopedics;  Laterality: Right;  . I&D EXTREMITY Right 07/23/2017   Procedure: REPEAT IRRIGATION AND DEBRIDEMENT RIGHT KNEE;  Surgeon: [name], MD;  Location: [facility];  Service: Orthopedics;  Laterality: Right;  . I&D EXTREMITY Right 11/28/2017   Procedure: IRRIGATION AND DEBRIDEMENT RIGHT KNEE , APPLY INSTILLATION VAC;  Surgeon: [name], MD;  Location: [facility];  Service: Orthopedics;  Laterality: Right;  . I&D EXTREMITY Right 12/03/2017   Procedure: REPEAT IRRIGATION AND DEBRIDEMENT RIGHT KNEE AND APPLICATION OF A WOUND VAC.;  Surgeon: [name], MD;  Location: [facility];  Service: Orthopedics;  Laterality: Right;  . KNEE ARTHROSCOPY Left    menisectomy  . MULTIPLE TOOTH EXTRACTIONS    . SKIN SPLIT GRAFT Right 07/25/2017   Procedure: Repeat Irrigation and Debridement Right Knee, Split Thickness Skin Graft;  Surgeon: [name], MD;  Location: [place];  Service: Orthopedics;  Laterality: Right;  . SKIN SPLIT GRAFT Right 12/05/2017  Procedure: SKIN GRAFT SPLIT THICKNESS WOUND KNEE, APPLY VAC;  Surgeon: [name], MD;  Location: [place];  Service: Orthopedics;  Laterality: Right;  . TONSILLECTOMY      Allergies as of 01/12/2018      Reactions   Cinnamon Hives   Ciprofloxacin Other (See Comments)   TREMORS   Diovan [valsartan] Other (See Comments)   Extreme vertigo   Food Diarrhea, Other (See Comments)   ORANGE JUICE   UPSET STOMACH   Latex Rash, Other (See  Comments)   Rash/inflammation due to exposure   Nitrofuran Derivatives Hives, Rash   "Full body rash"   Penicillins Hives, Swelling   *tolerated Ceftriaxone September 2018 Has patient had a PCN reaction causing immediate rash, facial/tongue/throat swelling, SOB or lightheadedness with hypotension:No--severe irritation at the injection site Has patient had a PCN reaction causing severe rash involving mucus membranes or skin necrosis:Unknown Has patient had a PCN reaction that required hospitalization:No Has patient had a PCN reaction occurring within the last 10 years:Yes If all of the above answers are "NO", then may proceed with   Bactrim [sulfamethoxazole-trimethoprim] Diarrhea, Nausea Only   Other Rash   Mycins, Strawberry, Oranges   Sulfa Antibiotics Diarrhea, Nausea Only      Medication List        Accurate as of 01/12/18  2:43 PM. Always use your most recent med list.          aspirin EC 81 MG tablet Take 81 mg by mouth daily with breakfast.   CALCIUM 600+D 600-400 MG-UNIT tablet Generic drug:  Calcium Carbonate-Vitamin D Take 1 tablet by mouth daily at 3 pm.   cephALEXin 500 MG capsule Commonly known as:  KEFLEX Take 500 mg by mouth 4 (four) times daily.   cetirizine 10 MG tablet Commonly known as:  ZYRTEC Take 10 mg by mouth at bedtime.   diltiazem 120 MG 24 hr capsule Commonly known as:  TIAZAC Take 120 mg by mouth daily with breakfast.   docusate sodium 100 MG capsule Commonly known as:  COLACE Take 1 capsule (100 mg total) by mouth daily.   famotidine 40 MG tablet Commonly known as:  PEPCID Take 40 mg by mouth daily at 3 pm. 1600   feeding supplement (ENSURE ENLIVE) Liqd Take 237 mLs by mouth 3 (three) times daily with meals.   furosemide 20 MG tablet Commonly known as:  LASIX Take 20 mg by mouth.   hydrALAZINE 50 MG tablet Commonly known as:  APRESOLINE Take 50 mg by mouth 4 (four) times daily.   methocarbamol 500 MG tablet Commonly known as:   ROBAXIN Take 500 mg by mouth every 6 (six) hours as needed for muscle spasms.   Oxycodone HCl 10 MG Tabs Take 10 mg by mouth every 4 (four) hours. Hold for drowsiness   oxyCODONE 5 MG immediate release tablet Commonly known as:  Oxy IR/ROXICODONE Take 5 mg by mouth See admin instructions. Every 30 mins before therapy   pantoprazole 40 MG tablet Commonly known as:  PROTONIX Take 1 tablet (40 mg total) by mouth daily.   polyethylene glycol packet Commonly known as:  MIRALAX / GLYCOLAX Take 17 g by mouth 2 (two) times daily.   senna 8.6 MG Tabs tablet Commonly known as:  SENOKOT Take 2 tablets (17.2 mg total) by mouth daily.   SYNTHROID 175 MCG tablet Generic drug:  levothyroxine Take 175 mcg by mouth daily before breakfast.       No orders of the defined  types were placed in this encounter.   Immunization History  Administered Date(s) Administered  . Influenza-Unspecified 12/08/2017  . Tdap 06/28/2015    Social History   Tobacco Use  . Smoking status: Former Smoker    Packs/day: 0.50    Years: 10.00    Pack years: 5.00    Last attempt to quit: 11/19/1979    Years since quitting: 38.1  . Smokeless tobacco: Never Used  Substance Use Topics  . Alcohol use: Yes    Alcohol/week: 8.4 oz    Types: 14 Glasses of wine per week    Comment: 2 glasses of wine with dinner    Review of Systems  DATA OBTAINED: from patient, nurse GENERAL:  no fevers, fatigue, appetite changes SKIN: No itching, rash HEENT: No complaint RESPIRATORY: No cough, wheezing, SOB CARDIAC: No chest pain, palpitations, lower extremity edema  GI: No abdominal pain, No N/V/D;+ constipation, No heartburn or reflux  GU: No dysuria, frequency or urgency, or incontinence  MUSCULOSKELETAL: ++unrelieved bone/joint pain NEUROLOGIC: No headache, dizziness  PSYCHIATRIC: ++ sadness  Vitals:   01/12/18 1421  BP: (!) 146/60  Pulse: 78  Resp: 18  Temp: 97.7 F (36.5 C)  SpO2: 97%   Body mass index  is 35.29 kg/m. Physical Exam  GENERAL APPEARANCE: Alert, conversant, emotionally and physically uncomfortable SKIN: No diaphoresis rash HEENT: Unremarkable RESPIRATORY: Breathing is even, unlabored. Lung sounds are clear   CARDIOVASCULAR: Heart RRR no murmurs, rubs or gallops. No peripheral edema  GASTROINTESTINAL: Abdomen is soft, non-tender, not distended w/ normal bowel sounds.  GENITOURINARY: Bladder non tender, not distended  MUSCULOSKELETAL: Right AKA-wound is clean now since she has finished her antibiotics NEUROLOGIC: Cranial nerves 2-12 grossly intact. Moves all extremities PSYCHIATRIC: Mood and affect appropriate to situation, she is devastated no behavioral issues  Patient Active Problem List   Diagnosis Date Noted  . Chronic ulcer of right heel with necrosis of bone ([place]) 12/27/2017  . Peripheral vascular disease of lower extremity with ulceration ([place]) 12/27/2017  . Acute renal failure superimposed on stage 2 chronic kidney disease ([place]) 12/27/2017  . GERD (gastroesophageal reflux disease) 12/27/2017  . Acute blood loss as cause of postoperative anemia 12/13/2017  . Wound of right leg, sequela 12/12/2017  . Open wound of right knee 11/28/2017  . Allergic drug rash   . Sepsis ([place]) 08/29/2017  . CKD (chronic kidney disease), stage III ([place]) 08/29/2017  . Hypothyroid 08/29/2017  . Hypertension 08/29/2017  . Peripheral vascular disease ([place]) 08/29/2017  . Skin ulcer of knee, right, with fat layer exposed ([place]) 08/29/2017  . Acute kidney injury ([place]) 08/29/2017  . Metabolic acidosis 82/42/3536  . Acute hyponatremia 08/29/2017  . Pressure injury of skin 07/25/2017  . Wound, open, knee, lower leg, or ankle with complication, right, initial encounter   . Idiopathic chronic venous hypertension of both lower extremities with ulcer and inflammation ([place]) 07/15/2017  . Skin ulcer of right knee with necrosis of muscle ([place]) 07/15/2017  . Atherosclerosis of artery of right lower  extremity ([place]) 07/11/2017  . Essential hypertension 07/11/2017  . Osteoporosis 07/11/2017  . Cataracts, bilateral 07/11/2017  . Hypothyroidism, acquired 12/27/2014  . Iron deficiency anemia 12/27/2014  . Hiatal hernia 12/27/2014  . Varicose veins of lower extremities with other complications 14/43/1540    CMP     Component Value Date/Time   NA 136 12/22/2017 0843   NA 133 (A) 12/11/2017   NA 140 04/15/2013 1516   K 3.3 (L) 12/22/2017 0867  K 4.3 04/15/2013 1516   CL 99 (L) 12/22/2017 0843   CL 105 04/15/2013 1516   CO2 23 12/22/2017 0843   CO2 25 04/15/2013 1516   GLUCOSE 85 12/22/2017 0843   GLUCOSE 94 04/15/2013 1516   BUN 10 12/22/2017 0843   BUN 16 12/11/2017   BUN 28.2 (H) 04/15/2013 1516   CREATININE 0.93 12/22/2017 0843   CREATININE 1.21 (H) 12/25/2014 1530   CREATININE 1.4 (H) 04/15/2013 1516   CALCIUM 8.6 (L) 12/22/2017 0843   CALCIUM 9.9 04/15/2013 1516   PROT 5.7 (L) 12/17/2017 0533   PROT 7.8 04/15/2013 1516   ALBUMIN 2.0 (L) 12/22/2017 0843   ALBUMIN 3.7 04/15/2013 1516   AST 21 12/17/2017 0533   AST 17 04/15/2013 1516   ALT 13 (L) 12/17/2017 0533   ALT 15 04/15/2013 1516   ALKPHOS 65 12/17/2017 0533   ALKPHOS 73 04/15/2013 1516   BILITOT 0.7 12/17/2017 0533   BILITOT 0.25 04/15/2013 1516   GFRNONAA 57 (L) 12/22/2017 0843   GFRNONAA 43 (L) 12/25/2014 1530   GFRAA >60 12/22/2017 0843   GFRAA 50 (L) 12/25/2014 1530   Recent Labs    08/29/17 1756  12/19/17 0542 12/20/17 0503 12/22/17 0843  NA  --    < > 133* 133* 136  K  --    < > 3.6 3.6 3.3*  CL  --    < > 102 102 99*  CO2  --    < > 23 22 23   GLUCOSE  --    < > 92 105* 85  BUN  --    < > 18 16 10   CREATININE  --    < > 1.04* 1.00 0.93  CALCIUM  --    < > 8.1* 8.1* 8.6*  MG 1.8  --   --   --   --   PHOS  --   --   --   --  2.8   < > = values in this interval not displayed.   Recent Labs    12/12/17 1527  12/15/17 0716 12/17/17 0533 12/22/17 0843  AST 18  --  18 21  --   ALT 13*   --  14 13*  --   ALKPHOS 69  --  70 65  --   BILITOT 0.2*  --  0.6 0.7  --   PROT 6.3*  --  6.3* 5.7*  --   ALBUMIN 2.5*   < > 2.4* 2.0* 2.0*   < > = values in this interval not displayed.   Recent Labs    12/11/17 12/12/17 1527  12/19/17 0542 12/20/17 0503 12/22/17 0843  WBC 13.3 14.5*   < > 11.4* 10.3 13.5*  NEUTROABS 10 11.0*  --   --   --  10.5*  HGB 7.2* 6.7*   < > 7.2* 7.3* 10.5*  HCT 23* 21.9*   < > 24.0* 23.3* 33.6*  MCV  --  79.6   < > 83.6 83.5 82.0  PLT 498* 459*   < > 394 395 505*   < > = values in this interval not displayed.   No results for input(s): CHOL, LDLCALC, TRIG in the last 8760 hours.  Invalid input(s): HCL No results found for: MICROALBUR Lab Results  Component Value Date   TSH 0.298 (L) 09/09/2017   Lab Results  Component Value Date   HGBA1C 5.6 12/12/2017   No results found for: CHOL, HDL, LDLCALC, LDLDIRECT, TRIG, CHOLHDL  Significant Diagnostic Results in last 30 days:  No results found.  Assessment and Plan  Consult for pain control/grief/depression constipation-patient and I know each other well and we spoke for some time; patient's Haze [place] normal but the intensity of it seems to much for the patient-we'll start Lexapro 10 mg by mouth daily; for pain will milligrams every 6 scheduled as somewhat patient is describing sounds like muscle spasms; will start Neurontin 300 mg every 8 for 3 days and 600 mg daily after that for the sharp burning pain that she describes; will start Elavil 50 mg by mouth daily at bedtime for the chronic pain control related and fentanyl 25 mg patch every 72 hours; I do believe these changes and pain medication may make the patient initially very sleepy which I'm fine with for a day or 2; will increase Senokot-S 2 by mouth twice a day for constipation.   Time spent greater than 40 minutes Webb Silversmith D. Sheppard Coil, MD

## 2018-01-12 NOTE — Telephone Encounter (Signed)
error 

## 2018-01-12 NOTE — Patient Outreach (Signed)
[facility]) Care Management  01/12/2018  ANU STAGNER February 21, 1935 [id number]  Assessment- CSW completed call to [facility] and spoke to social worker [place]. She denies having any further updates in regards to patient's recent interest in Hospice and Palliative Care. [name] reports that patient continues to work with PT and OT and that no discharge date has been set at this time and she does not foresee one being set anytime soon.   Plan-CSW will continue to follow patient and will await for SNF discharge.  [name], BSW, MSW, [email or web address] Phone: [phone] Fax: [phone]

## 2018-01-15 ENCOUNTER — Non-Acute Institutional Stay (SKILLED_NURSING_FACILITY): Payer: Medicare Other | Admitting: Internal Medicine

## 2018-01-15 DIAGNOSIS — R52 Pain, unspecified: Secondary | ICD-10-CM

## 2018-01-15 DIAGNOSIS — T783XXA Angioneurotic edema, initial encounter: Secondary | ICD-10-CM

## 2018-01-17 ENCOUNTER — Encounter: Payer: Self-pay | Admitting: Internal Medicine

## 2018-01-18 ENCOUNTER — Encounter: Payer: Self-pay | Admitting: Internal Medicine

## 2018-01-18 NOTE — Progress Notes (Signed)
Location:   [facility] of Service:   skilled [place]    Patient Care Team: [name], MD as PCP - General (Internal Medicine) Carolan Clines, MD as Attending Physician (Urology) Charlies Silvers, MD as Consulting Physician (Allergy) Greg Cutter, LCSW as [facility] (Licensed Clinical Social Worker)  Extended Emergency Contact Information Primary Emergency Contact: Domino,Michael Address: [address]          [place], [place] [address] Johnnette Litter of [place] Phone: [phone] Relation: Spouse    Allergies: Cinnamon; Ciprofloxacin; Diovan [valsartan]; Food; Latex; Nitrofuran derivatives; Penicillins; Bactrim [sulfamethoxazole-trimethoprim]; Other; and Sulfa antibiotics  Chief Complaint  Patient presents with  . Acute Visit    HPI: Patient is 83 y.o. female who Is being followed up for a visit on 2-26 for pain control. Patient has no complaint of pain but is sleeping too much, therefore is on too much pain medication. Patient also has swelling in her bilateral hands. This along with her complaining yesterday of having a sore throat, and today of her somewhat clearing her throat makes me wonder about angioedema. Always review pain regimen again today and a lot of these new findings.  Past Medical History:  Diagnosis Date  . Allergy   . Anemia   . CKD (chronic kidney disease), stage II   . Fibromyalgia   . GERD (gastroesophageal reflux disease)   . Hiatal hernia   . Hypertension   . Hypothyroid   . Lymphedema    venous insufficency  . Osteoarthritis   . Peripheral vascular disease ([place])   . Ulcer of knee ([place])    right  . Urinary incontinence   . Varicose veins   . Venous insufficiency     Past Surgical History:  Procedure Laterality Date  . AMPUTATION Right 12/15/2017   Procedure: AMPUTATION ABOVE KNEE, RIGHT;  Surgeon: Elam Dutch, MD;  Location: [facility];  Service:  Vascular;  Laterality: Right;  . APPLICATION OF WOUND VAC Right 07/18/2017   Procedure: APPLICATION OF WOUND VAC;  Surgeon: [name], MD;  Location: [facility];  Service: Orthopedics;  Laterality: Right;  . CATARACT EXTRACTION W/ INTRAOCULAR LENS  IMPLANT, BILATERAL    . COLONOSCOPY    . I&D EXTREMITY Right 07/18/2017   Procedure: IRRIGATION AND DEBRIDEMENT RIGHT KNEE;  Surgeon: [name], MD;  Location: [facility];  Service: Orthopedics;  Laterality: Right;  . I&D EXTREMITY Right 07/23/2017   Procedure: REPEAT IRRIGATION AND DEBRIDEMENT RIGHT KNEE;  Surgeon: [name], MD;  Location: [facility];  Service: Orthopedics;  Laterality: Right;  . I&D EXTREMITY Right 11/28/2017   Procedure: IRRIGATION AND DEBRIDEMENT RIGHT KNEE , APPLY INSTILLATION VAC;  Surgeon: [name], MD;  Location: [facility];  Service: Orthopedics;  Laterality: Right;  . I&D EXTREMITY Right 12/03/2017   Procedure: REPEAT IRRIGATION AND DEBRIDEMENT RIGHT KNEE AND APPLICATION OF A WOUND VAC.;  Surgeon: [name], MD;  Location: [facility];  Service: Orthopedics;  Laterality: Right;  . KNEE ARTHROSCOPY Left    menisectomy  . MULTIPLE TOOTH EXTRACTIONS    . SKIN SPLIT GRAFT Right 07/25/2017   Procedure: Repeat Irrigation and Debridement Right Knee, Split Thickness Skin Graft;  Surgeon: [name], MD;  Location: [facility];  Service: Orthopedics;  Laterality: Right;  . SKIN SPLIT GRAFT Right 12/05/2017   Procedure: SKIN GRAFT SPLIT THICKNESS WOUND KNEE, APPLY VAC;  Surgeon: [name], MD;  Location:  [facility];  Service: Orthopedics;  Laterality: Right;  . TONSILLECTOMY      Allergies as of 01/15/2018      Reactions   Cinnamon Hives   Ciprofloxacin Other (See Comments)   TREMORS   Diovan [valsartan] Other (See Comments)   Extreme vertigo   Food Diarrhea, Other (See Comments)   ORANGE JUICE   UPSET STOMACH   Latex Rash, Other (See Comments)   Rash/inflammation due to exposure   Nitrofuran Derivatives Hives, Rash   "Full body  rash"   Penicillins Hives, Swelling   *tolerated Ceftriaxone September 2018 Has patient had a PCN reaction causing immediate rash, facial/tongue/throat swelling, SOB or lightheadedness with hypotension:No--severe irritation at the injection site Has patient had a PCN reaction causing severe rash involving mucus membranes or skin necrosis:Unknown Has patient had a PCN reaction that required hospitalization:No Has patient had a PCN reaction occurring within the last 10 years:Yes If all of the above answers are "NO", then may proceed with   Bactrim [sulfamethoxazole-trimethoprim] Diarrhea, Nausea Only   Other Rash   Mycins, Strawberry, Oranges   Sulfa Antibiotics Diarrhea, Nausea Only      Medication List        Accurate as of 01/15/18 11:59 PM. Always use your most recent med list.          aspirin EC 81 MG tablet Take 81 mg by mouth daily with breakfast.   CALCIUM 600+D 600-400 MG-UNIT tablet Generic drug:  Calcium Carbonate-Vitamin D Take 1 tablet by mouth daily at 3 pm.   cephALEXin 500 MG capsule Commonly known as:  KEFLEX Take 500 mg by mouth 4 (four) times daily.   cetirizine 10 MG tablet Commonly known as:  ZYRTEC Take 10 mg by mouth at bedtime.   diltiazem 120 MG 24 hr capsule Commonly known as:  TIAZAC Take 120 mg by mouth daily with breakfast.   docusate sodium 100 MG capsule Commonly known as:  COLACE Take 1 capsule (100 mg total) by mouth daily.   famotidine 40 MG tablet Commonly known as:  PEPCID Take 40 mg by mouth daily at 3 pm. 1600   feeding supplement (ENSURE ENLIVE) Liqd Take 237 mLs by mouth 3 (three) times daily with meals.   furosemide 20 MG tablet Commonly known as:  LASIX Take 20 mg by mouth.   hydrALAZINE 50 MG tablet Commonly known as:  APRESOLINE Take 50 mg by mouth 4 (four) times daily.   methocarbamol 500 MG tablet Commonly known as:  ROBAXIN Take 500 mg by mouth every 6 (six) hours as needed for muscle spasms.   Oxycodone HCl  10 MG Tabs Take 10 mg by mouth every 4 (four) hours. Hold for drowsiness   oxyCODONE 5 MG immediate release tablet Commonly known as:  Oxy IR/ROXICODONE Take 5 mg by mouth See admin instructions. Every 30 mins before therapy   pantoprazole 40 MG tablet Commonly known as:  PROTONIX Take 1 tablet (40 mg total) by mouth daily.   polyethylene glycol packet Commonly known as:  MIRALAX / GLYCOLAX Take 17 g by mouth 2 (two) times daily.   senna 8.6 MG Tabs tablet Commonly known as:  SENOKOT Take 2 tablets (17.2 mg total) by mouth daily.   SYNTHROID 175 MCG tablet Generic drug:  levothyroxine Take 175 mcg by mouth daily before breakfast.       No orders of the defined types were placed in this encounter.   Immunization History  Administered Date(s) Administered  . Influenza-Unspecified 12/08/2017  .  Tdap 06/28/2015    Social History   Tobacco Use  . Smoking status: Former Smoker    Packs/day: 0.50    Years: 10.00    Pack years: 5.00    Last attempt to quit: 11/19/1979    Years since quitting: 38.1  . Smokeless tobacco: Never Used  Substance Use Topics  . Alcohol use: Yes    Alcohol/week: 8.4 oz    Types: 14 Glasses of wine per week    Comment: 2 glasses of wine with dinner    Review of Systems  DATA OBTAINED: from patient, nurse, medical record, family member GENERAL:  no fevers, fatigue, appetite changes SKIN: No itching, rash HEENT: No complaint RESPIRATORY: No cough, wheezing, SOB CARDIAC: No chest pain, palpitations, lower extremity edema  GI: No abdominal pain, No N/V/D or constipation, No heartburn or reflux  GU: No dysuria, frequency or urgency, or incontinence  MUSCULOSKELETAL: No unrelieved bone/joint pain NEUROLOGIC: No headache, dizziness  PSYCHIATRIC: No overt anxiety or sadness  Vitals:   01/18/18 1850  BP: 130/62  Pulse: 82  Resp: 17  Temp: 98 F (36.7 C)   There is no height or weight on file to calculate BMI. Physical Exam  GENERAL  APPEARANCE:  drowsy but fully awake when conversant , No acute distress  SKIN: No diaphoresis rash HEENT: Unremarkable RESPIRATORY: Breathing is even, unlabored. Lung sounds are clear   CARDIOVASCULAR: Heart RRR no murmurs, rubs or gallops; bilateral hands are swollen  GASTROINTESTINAL: Abdomen is soft, non-tender, not distended w/ normal bowel sounds.  GENITOURINARY: Bladder non tender, not distended  MUSCULOSKELETAL: Right AKA  NEUROLOGIC: Cranial nerves 2-12 grossly intact. Moves all extremities PSYCHIATRIC: Mood and affect appropriate to situation, patient is sad, no behavioral issues  Patient Active Problem List   Diagnosis Date Noted  . Chronic ulcer of right heel with necrosis of bone ([place]) 12/27/2017  . Peripheral vascular disease of lower extremity with ulceration ([place]) 12/27/2017  . Acute renal failure superimposed on stage 2 chronic kidney disease ([place]) 12/27/2017  . GERD (gastroesophageal reflux disease) 12/27/2017  . Acute blood loss as cause of postoperative anemia 12/13/2017  . Wound of right leg, sequela 12/12/2017  . Open wound of right knee 11/28/2017  . Allergic drug rash   . Sepsis ([place]) 08/29/2017  . CKD (chronic kidney disease), stage III ([place]) 08/29/2017  . Hypothyroid 08/29/2017  . Hypertension 08/29/2017  . Peripheral vascular disease ([place]) 08/29/2017  . Skin ulcer of knee, right, with fat layer exposed ([place]) 08/29/2017  . Acute kidney injury ([place]) 08/29/2017  . Metabolic acidosis 15/17/6160  . Acute hyponatremia 08/29/2017  . Pressure injury of skin 07/25/2017  . Wound, open, knee, lower leg, or ankle with complication, right, initial encounter   . Idiopathic chronic venous hypertension of both lower extremities with ulcer and inflammation ([place]) 07/15/2017  . Skin ulcer of right knee with necrosis of muscle ([place]) 07/15/2017  . Atherosclerosis of artery of right lower extremity ([place]) 07/11/2017  . Essential hypertension 07/11/2017  . Osteoporosis  07/11/2017  . Cataracts, bilateral 07/11/2017  . Hypothyroidism, acquired 12/27/2014  . Iron deficiency anemia 12/27/2014  . Hiatal hernia 12/27/2014  . Varicose veins of lower extremities with other complications 73/71/0626    CMP     Component Value Date/Time   NA 136 12/22/2017 0843   NA 133 (A) 12/11/2017   NA 140 04/15/2013 1516   K 3.3 (L) 12/22/2017 0843   K 4.3 04/15/2013 1516   CL 99 (L) 12/22/2017 9485  CL 105 04/15/2013 1516   CO2 23 12/22/2017 0843   CO2 25 04/15/2013 1516   GLUCOSE 85 12/22/2017 0843   GLUCOSE 94 04/15/2013 1516   BUN 10 12/22/2017 0843   BUN 16 12/11/2017   BUN 28.2 (H) 04/15/2013 1516   CREATININE 0.93 12/22/2017 0843   CREATININE 1.21 (H) 12/25/2014 1530   CREATININE 1.4 (H) 04/15/2013 1516   CALCIUM 8.6 (L) 12/22/2017 0843   CALCIUM 9.9 04/15/2013 1516   PROT 5.7 (L) 12/17/2017 0533   PROT 7.8 04/15/2013 1516   ALBUMIN 2.0 (L) 12/22/2017 0843   ALBUMIN 3.7 04/15/2013 1516   AST 21 12/17/2017 0533   AST 17 04/15/2013 1516   ALT 13 (L) 12/17/2017 0533   ALT 15 04/15/2013 1516   ALKPHOS 65 12/17/2017 0533   ALKPHOS 73 04/15/2013 1516   BILITOT 0.7 12/17/2017 0533   BILITOT 0.25 04/15/2013 1516   GFRNONAA 57 (L) 12/22/2017 0843   GFRNONAA 43 (L) 12/25/2014 1530   GFRAA >60 12/22/2017 0843   GFRAA 50 (L) 12/25/2014 1530   Recent Labs    08/29/17 1756  12/19/17 0542 12/20/17 0503 12/22/17 0843  NA  --    < > 133* 133* 136  K  --    < > 3.6 3.6 3.3*  CL  --    < > 102 102 99*  CO2  --    < > 23 22 23   GLUCOSE  --    < > 92 105* 85  BUN  --    < > 18 16 10   CREATININE  --    < > 1.04* 1.00 0.93  CALCIUM  --    < > 8.1* 8.1* 8.6*  MG 1.8  --   --   --   --   PHOS  --   --   --   --  2.8   < > = values in this interval not displayed.   Recent Labs    12/12/17 1527  12/15/17 0716 12/17/17 0533 12/22/17 0843  AST 18  --  18 21  --   ALT 13*  --  14 13*  --   ALKPHOS 69  --  70 65  --   BILITOT 0.2*  --  0.6 0.7  --     PROT 6.3*  --  6.3* 5.7*  --   ALBUMIN 2.5*   < > 2.4* 2.0* 2.0*   < > = values in this interval not displayed.   Recent Labs    12/11/17 12/12/17 1527  12/19/17 0542 12/20/17 0503 12/22/17 0843  WBC 13.3 14.5*   < > 11.4* 10.3 13.5*  NEUTROABS 10 11.0*  --   --   --  10.5*  HGB 7.2* 6.7*   < > 7.2* 7.3* 10.5*  HCT 23* 21.9*   < > 24.0* 23.3* 33.6*  MCV  --  79.6   < > 83.6 83.5 82.0  PLT 498* 459*   < > 394 395 505*   < > = values in this interval not displayed.   No results for input(s): CHOL, LDLCALC, TRIG in the last 8760 hours.  Invalid input(s): HCL No results found for: MICROALBUR Lab Results  Component Value Date   TSH 0.298 (L) 09/09/2017   Lab Results  Component Value Date   HGBA1C 5.6 12/12/2017   No results found for: CHOL, HDL, LDLCALC, LDLDIRECT, TRIG, CHOLHDL  Significant Diagnostic Results in last 30 days:  No results found.  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   Time spent greater than 40 minutes;> 50% of time with patient was spent reviewing records, labs, tests and studies, counseling and developing plan of care  Inocencio Homes, MD

## 2018-01-19 ENCOUNTER — Other Ambulatory Visit: Payer: Self-pay | Admitting: Licensed Clinical Social Worker

## 2018-01-19 ENCOUNTER — Non-Acute Institutional Stay (SKILLED_NURSING_FACILITY): Payer: Medicare Other | Admitting: Internal Medicine

## 2018-01-19 DIAGNOSIS — I10 Essential (primary) hypertension: Secondary | ICD-10-CM | POA: Diagnosis not present

## 2018-01-19 DIAGNOSIS — M810 Age-related osteoporosis without current pathological fracture: Secondary | ICD-10-CM | POA: Diagnosis not present

## 2018-01-19 DIAGNOSIS — E039 Hypothyroidism, unspecified: Secondary | ICD-10-CM | POA: Diagnosis not present

## 2018-01-19 DIAGNOSIS — L89621 Pressure ulcer of left heel, stage 1: Secondary | ICD-10-CM | POA: Diagnosis not present

## 2018-01-19 DIAGNOSIS — I70245 Atherosclerosis of native arteries of left leg with ulceration of other part of foot: Secondary | ICD-10-CM | POA: Diagnosis not present

## 2018-01-19 NOTE — Patient Outreach (Signed)
[facility]) Care Management  01/19/2018  BULAH LURIE Apr 09, 1935 [id number]  Assessment- CSW completed call to [facility] worker [place] on 01/19/18 but was unable to reach her successfully. CSW left a detailed voice message requesting a return call with discharge updates. This CSW will be out of the office starting tomorrow and will return on 01/28/18.    [name], BSW, MSW, [email or web address] Phone: [phone] Fax: [phone]

## 2018-01-20 ENCOUNTER — Encounter: Payer: Self-pay | Admitting: Internal Medicine

## 2018-01-20 NOTE — Assessment & Plan Note (Signed)
Fracture; continue calcium plus D6 100/400 one by mouth daily

## 2018-01-20 NOTE — Assessment & Plan Note (Signed)
TSH 0.298 4 months ago; will decrease Synthroid to 150 g by mouth daily

## 2018-01-20 NOTE — Assessment & Plan Note (Signed)
Control; continue diltiazem 120 mg daily, Lasix 20 mg daily and hydralazine 50 mg 4 times a day

## 2018-01-20 NOTE — Progress Notes (Signed)
Location:   [facility] of Service:   [place] MD   Patient Care Team: [name], MD as PCP - General (Internal Medicine) Carolan Clines, MD as Attending Physician (Urology) Charlies Silvers, MD as Consulting Physician (Allergy) Greg Cutter, LCSW as [place] Management (Licensed Clinical Social Worker)  Extended Emergency Contact Information Primary Emergency Contact: Holik,Michael Address: [address]          [place], [place] [address] Johnnette Litter of [place] Phone: [phone] Relation: Spouse    Allergies: Cinnamon; Ciprofloxacin; Diovan [valsartan]; Food; Latex; Nitrofuran derivatives; Penicillins; Bactrim [sulfamethoxazole-trimethoprim]; Other; and Sulfa antibiotics  Chief Complaint  Patient presents with  . Medical Management of Chronic Issues    HPI: Patient is 83 y.o. female who seenforroutineissuesofosteoporosis,hypothyroidism,andhypertension.  Past Medical History:  Diagnosis Date  . Allergy   . Anemia   . CKD (chronic kidney disease), stage II   . Fibromyalgia   . GERD (gastroesophageal reflux disease)   . Hiatal hernia   . Hypertension   . Hypothyroid   . Lymphedema    venous insufficency  . Osteoarthritis   . Peripheral vascular disease ([place])   . Ulcer of knee ([place])    right  . Urinary incontinence   . Varicose veins   . Venous insufficiency     Past Surgical History:  Procedure Laterality Date  . AMPUTATION Right 12/15/2017   Procedure: AMPUTATION ABOVE KNEE, RIGHT;  Surgeon: Elam Dutch, MD;  Location: [place] [facility];  Service: Vascular;  Laterality: Right;  . APPLICATION OF WOUND VAC Right 07/18/2017   Procedure: APPLICATION OF WOUND VAC;  Surgeon: [name], MD;  Location: [facility];  Service: Orthopedics;  Laterality: Right;  . CATARACT EXTRACTION W/ INTRAOCULAR LENS  IMPLANT, BILATERAL    . COLONOSCOPY    . I&D EXTREMITY Right 07/18/2017   Procedure:  IRRIGATION AND DEBRIDEMENT RIGHT KNEE;  Surgeon: [name], MD;  Location: [facility];  Service: Orthopedics;  Laterality: Right;  . I&D EXTREMITY Right 07/23/2017   Procedure: REPEAT IRRIGATION AND DEBRIDEMENT RIGHT KNEE;  Surgeon: [name], MD;  Location: [facility];  Service: Orthopedics;  Laterality: Right;  . I&D EXTREMITY Right 11/28/2017   Procedure: IRRIGATION AND DEBRIDEMENT RIGHT KNEE , APPLY INSTILLATION VAC;  Surgeon: [name], MD;  Location: [facility];  Service: Orthopedics;  Laterality: Right;  . I&D EXTREMITY Right 12/03/2017   Procedure: REPEAT IRRIGATION AND DEBRIDEMENT RIGHT KNEE AND APPLICATION OF A WOUND VAC.;  Surgeon: [name], MD;  Location: [facility];  Service: Orthopedics;  Laterality: Right;  . KNEE ARTHROSCOPY Left    menisectomy  . MULTIPLE TOOTH EXTRACTIONS    . SKIN SPLIT GRAFT Right 07/25/2017   Procedure: Repeat Irrigation and Debridement Right Knee, Split Thickness Skin Graft;  Surgeon: [name], MD;  Location: [facility];  Service: Orthopedics;  Laterality: Right;  . SKIN SPLIT GRAFT Right 12/05/2017   Procedure: SKIN GRAFT SPLIT THICKNESS WOUND KNEE, APPLY VAC;  Surgeon: [name], MD;  Location: [facility];  Service: Orthopedics;  Laterality: Right;  . TONSILLECTOMY      Allergies as of 01/19/2018      Reactions   Cinnamon Hives   Ciprofloxacin Other (See Comments)   TREMORS   Diovan [valsartan] Other (See Comments)   Extreme vertigo   Food Diarrhea, Other (See Comments)   ORANGE JUICE   UPSET STOMACH   Latex Rash, Other (See  Comments)   Rash/inflammation due to exposure   Nitrofuran Derivatives Hives, Rash   "Full body rash"   Penicillins Hives, Swelling   *tolerated Ceftriaxone September 2018 Has patient had a PCN reaction causing immediate rash, facial/tongue/throat swelling, SOB or lightheadedness with hypotension:No--severe irritation at the injection site Has patient had a PCN reaction causing severe rash involving mucus membranes or skin  necrosis:Unknown Has patient had a PCN reaction that required hospitalization:No Has patient had a PCN reaction occurring within the last 10 years:Yes If all of the above answers are "NO", then may proceed with   Bactrim [sulfamethoxazole-trimethoprim] Diarrhea, Nausea Only   Other Rash   Mycins, Strawberry, Oranges   Sulfa Antibiotics Diarrhea, Nausea Only      Medication List        Accurate as of 01/19/18 11:59 PM. Always use your most recent med list.          aspirin EC 81 MG tablet Take 81 mg by mouth daily with breakfast.   CALCIUM 600+D 600-400 MG-UNIT tablet Generic drug:  Calcium Carbonate-Vitamin D Take 1 tablet by mouth daily at 3 pm.   cetirizine 10 MG tablet Commonly known as:  ZYRTEC Take 10 mg by mouth at bedtime.   diltiazem 120 MG 24 hr capsule Commonly known as:  TIAZAC Take 120 mg by mouth daily with breakfast.   docusate sodium 100 MG capsule Commonly known as:  COLACE Take 1 capsule (100 mg total) by mouth daily.   famotidine 40 MG tablet Commonly known as:  PEPCID Take 40 mg by mouth daily at 3 pm. 1600   feeding supplement (ENSURE ENLIVE) Liqd Take 237 mLs by mouth 3 (three) times daily with meals.   furosemide 20 MG tablet Commonly known as:  LASIX Take 20 mg by mouth.   hydrALAZINE 50 MG tablet Commonly known as:  APRESOLINE Take 50 mg by mouth 4 (four) times daily.   methocarbamol 500 MG tablet Commonly known as:  ROBAXIN Take 500 mg by mouth every 6 (six) hours as needed for muscle spasms.   Oxycodone HCl 10 MG Tabs Take 10 mg by mouth every 4 (four) hours. Hold for drowsiness   oxyCODONE 5 MG immediate release tablet Commonly known as:  Oxy IR/ROXICODONE Take 5 mg by mouth See admin instructions. Every 30 mins before therapy   pantoprazole 40 MG tablet Commonly known as:  PROTONIX Take 1 tablet (40 mg total) by mouth daily.   polyethylene glycol packet Commonly known as:  MIRALAX / GLYCOLAX Take 17 g by mouth 2 (two)  times daily.   senna 8.6 MG Tabs tablet Commonly known as:  SENOKOT Take 2 tablets (17.2 mg total) by mouth daily.   SYNTHROID 175 MCG tablet Generic drug:  levothyroxine Take 175 mcg by mouth daily before breakfast.       No orders of the defined types were placed in this encounter.   Immunization History  Administered Date(s) Administered  . Influenza-Unspecified 12/08/2017  . Tdap 06/28/2015    Social History   Tobacco Use  . Smoking status: Former Smoker    Packs/day: 0.50    Years: 10.00    Pack years: 5.00    Last attempt to quit: 11/19/1979    Years since quitting: 38.1  . Smokeless tobacco: Never Used  Substance Use Topics  . Alcohol use: Yes    Alcohol/week: 8.4 oz    Types: 14 Glasses of wine per week    Comment: 2 glasses of wine with dinner  Review of Systems  DATA OBTAINED: from patient, nurse GENERAL:  no fevers, fatigue, appetite changes SKIN: No itching, rash HEENT: No complaint RESPIRATORY: No cough, wheezing, SOB CARDIAC: No chest pain, palpitations, lower extremity edema  GI: No abdominal pain, No N/V/D or constipation, No heartburn or reflux  GU: No dysuria, frequency or urgency, or incontinence  MUSCULOSKELETAL: No unrelieved bone/joint pain NEUROLOGIC: No headache, dizziness  PSYCHIATRIC: No overt anxiety or sadness  Vitals:   01/20/18 1925  BP: 111/60  Pulse: 82  Resp: 20  Temp: 98.3 F (36.8 C)   There is no height or weight on file to calculate BMI. Physical Exam  GENERAL APPEARANCE: Alert, conversant, appears fairly comfortable  SKIN: No diaphoresis rash HEENT: Unremarkable RESPIRATORY: Breathing is even, unlabored. Lung sounds are clear   CARDIOVASCULAR: Heart RRR no murmurs, rubs or gallops. No peripheral edema  GASTROINTESTINAL: Abdomen is soft, non-tender, not distended w/ normal bowel sounds.  GENITOURINARY: Bladder non tender, not distended  MUSCULOSKELETAL: Right AKA NEUROLOGIC: Cranial nerves 2-12 grossly  intact. Moves all extremities PSYCHIATRIC: Mood and affect sad, no behavioral issues  Patient Active Problem List   Diagnosis Date Noted  . Chronic ulcer of right heel with necrosis of bone ([place]) 12/27/2017  . Peripheral vascular disease of lower extremity with ulceration ([place]) 12/27/2017  . Acute renal failure superimposed on stage 2 chronic kidney disease ([place]) 12/27/2017  . GERD (gastroesophageal reflux disease) 12/27/2017  . Acute blood loss as cause of postoperative anemia 12/13/2017  . Wound of right leg, sequela 12/12/2017  . Open wound of right knee 11/28/2017  . Allergic drug rash   . Sepsis ([place]) 08/29/2017  . CKD (chronic kidney disease), stage III ([place]) 08/29/2017  . Hypothyroid 08/29/2017  . Hypertension 08/29/2017  . Peripheral vascular disease ([place]) 08/29/2017  . Skin ulcer of knee, right, with fat layer exposed ([place]) 08/29/2017  . Acute kidney injury ([place]) 08/29/2017  . Metabolic acidosis 56/21/3086  . Acute hyponatremia 08/29/2017  . Pressure injury of skin 07/25/2017  . Wound, open, knee, lower leg, or ankle with complication, right, initial encounter   . Idiopathic chronic venous hypertension of both lower extremities with ulcer and inflammation ([place]) 07/15/2017  . Skin ulcer of right knee with necrosis of muscle ([place]) 07/15/2017  . Atherosclerosis of artery of right lower extremity ([place]) 07/11/2017  . Essential hypertension 07/11/2017  . Osteoporosis 07/11/2017  . Cataracts, bilateral 07/11/2017  . Hypothyroidism, acquired 12/27/2014  . Iron deficiency anemia 12/27/2014  . Hiatal hernia 12/27/2014  . Varicose veins of lower extremities with other complications 57/84/6962    CMP     Component Value Date/Time   NA 136 12/22/2017 0843   NA 133 (A) 12/11/2017   NA 140 04/15/2013 1516   K 3.3 (L) 12/22/2017 0843   K 4.3 04/15/2013 1516   CL 99 (L) 12/22/2017 0843   CL 105 04/15/2013 1516   CO2 23 12/22/2017 0843   CO2 25 04/15/2013 1516   GLUCOSE 85  12/22/2017 0843   GLUCOSE 94 04/15/2013 1516   BUN 10 12/22/2017 0843   BUN 16 12/11/2017   BUN 28.2 (H) 04/15/2013 1516   CREATININE 0.93 12/22/2017 0843   CREATININE 1.21 (H) 12/25/2014 1530   CREATININE 1.4 (H) 04/15/2013 1516   CALCIUM 8.6 (L) 12/22/2017 0843   CALCIUM 9.9 04/15/2013 1516   PROT 5.7 (L) 12/17/2017 0533   PROT 7.8 04/15/2013 1516   ALBUMIN 2.0 (L) 12/22/2017 0843   ALBUMIN 3.7 04/15/2013 1516  AST 21 12/17/2017 0533   AST 17 04/15/2013 1516   ALT 13 (L) 12/17/2017 0533   ALT 15 04/15/2013 1516   ALKPHOS 65 12/17/2017 0533   ALKPHOS 73 04/15/2013 1516   BILITOT 0.7 12/17/2017 0533   BILITOT 0.25 04/15/2013 1516   GFRNONAA 57 (L) 12/22/2017 0843   GFRNONAA 43 (L) 12/25/2014 1530   GFRAA >60 12/22/2017 0843   GFRAA 50 (L) 12/25/2014 1530   Recent Labs    08/29/17 1756  12/19/17 0542 12/20/17 0503 12/22/17 0843  NA  --    < > 133* 133* 136  K  --    < > 3.6 3.6 3.3*  CL  --    < > 102 102 99*  CO2  --    < > 23 22 23   GLUCOSE  --    < > 92 105* 85  BUN  --    < > 18 16 10   CREATININE  --    < > 1.04* 1.00 0.93  CALCIUM  --    < > 8.1* 8.1* 8.6*  MG 1.8  --   --   --   --   PHOS  --   --   --   --  2.8   < > = values in this interval not displayed.   Recent Labs    12/12/17 1527  12/15/17 0716 12/17/17 0533 12/22/17 0843  AST 18  --  18 21  --   ALT 13*  --  14 13*  --   ALKPHOS 69  --  70 65  --   BILITOT 0.2*  --  0.6 0.7  --   PROT 6.3*  --  6.3* 5.7*  --   ALBUMIN 2.5*   < > 2.4* 2.0* 2.0*   < > = values in this interval not displayed.   Recent Labs    12/11/17 12/12/17 1527  12/19/17 0542 12/20/17 0503 12/22/17 0843  WBC 13.3 14.5*   < > 11.4* 10.3 13.5*  NEUTROABS 10 11.0*  --   --   --  10.5*  HGB 7.2* 6.7*   < > 7.2* 7.3* 10.5*  HCT 23* 21.9*   < > 24.0* 23.3* 33.6*  MCV  --  79.6   < > 83.6 83.5 82.0  PLT 498* 459*   < > 394 395 505*   < > = values in this interval not displayed.   No results for input(s): CHOL,  LDLCALC, TRIG in the last 8760 hours.  Invalid input(s): HCL No results found for: MICROALBUR Lab Results  Component Value Date   TSH 0.298 (L) 09/09/2017   Lab Results  Component Value Date   HGBA1C 5.6 12/12/2017   No results found for: CHOL, HDL, LDLCALC, LDLDIRECT, TRIG, CHOLHDL  Significant Diagnostic Results in last 30 days:  No results found.  Assessment and Plan  Osteoporosis Fracture; continue calcium plus D6 100/400 one by mouth daily  Hypothyroidism, acquired TSH 0.298 4 months ago; will decrease Synthroid to 150 g by mouth daily  Essential hypertension Control; continue diltiazem 120 mg daily, Lasix 20 mg daily and hydralazine 50 mg 4 times a day    Inocencio Homes, MD

## 2018-01-22 ENCOUNTER — Other Ambulatory Visit: Payer: Self-pay | Admitting: *Deleted

## 2018-01-22 ENCOUNTER — Encounter: Payer: Self-pay | Admitting: Vascular Surgery

## 2018-01-22 ENCOUNTER — Other Ambulatory Visit: Payer: Self-pay

## 2018-01-22 ENCOUNTER — Ambulatory Visit (INDEPENDENT_AMBULATORY_CARE_PROVIDER_SITE_OTHER): Payer: Medicare Other | Admitting: Vascular Surgery

## 2018-01-22 VITALS — BP 125/54 | HR 82 | Temp 99.5°F | Resp 20 | Ht 63.0 in | Wt 199.0 lb

## 2018-01-22 DIAGNOSIS — I739 Peripheral vascular disease, unspecified: Secondary | ICD-10-CM

## 2018-01-22 NOTE — Patient Outreach (Signed)
[facility]) Care Management  01/22/2018  Shelby Reese 07/06/1935 [id number]   This [facility] CSW covering today contacted [facility] SNF for update on patient and progress.  Per [name] at [facility], patient is slow to progress and is very "up and down emotionally in regards to her amputation".  CSW provided SNF rep with info and contact name and # for Amputee Support group that meets locally each month.  SNF rep plans to call and see if someone "who can relate" might come and meet patient to offer support and understanding and then perhaps get pt to attend the support group(s).    Will update [facility] CSW upon her return next week to above.   Eduard Clos, MSW, [place] Worker  [place] [phone]

## 2018-01-22 NOTE — Progress Notes (Signed)
Patient is an 83 year old female who returns for follow-up today.  She previously went under a right above-knee amputation on December 15, 2017.  This is healed completely.  She still has extensive wounds on her left foot.  She was offered a left above-knee amputation while in the hospital but then changed her mind.  She currently has opted for continued local wound care in the left leg.  She will follow-up with me on as-needed basis.  Ruta Hinds, MD [facility] [place] Office: [phone] Pager: [phone]

## 2018-01-27 ENCOUNTER — Other Ambulatory Visit: Payer: Self-pay | Admitting: *Deleted

## 2018-01-27 NOTE — Patient Outreach (Signed)
[facility]) Care Management  01/27/2018  Shelby Reese 02-28-1935 [id number]   Phone call to the discharge planner at [facility], spoke with Shelby Reese regarding patient's status in rehab. Per [name], she had no updates at this time regarding patient's status. She is aware of patient's self referral  for Hospice care, however she is not sure if she has been accepted.   Plan: This Education officer, museum to inform [name], currently assigned social worker for continued follow up.  [name] [facility] Care Management [phone]

## 2018-01-30 ENCOUNTER — Other Ambulatory Visit: Payer: Self-pay | Admitting: Licensed Clinical Social Worker

## 2018-01-30 DIAGNOSIS — L89621 Pressure ulcer of left heel, stage 1: Secondary | ICD-10-CM | POA: Diagnosis not present

## 2018-01-30 DIAGNOSIS — I70245 Atherosclerosis of native arteries of left leg with ulceration of other part of foot: Secondary | ICD-10-CM | POA: Diagnosis not present

## 2018-01-30 NOTE — Patient Outreach (Signed)
[facility]) Care Management  [facility] Social Work  01/30/2018  Shelby Reese 28-Jul-1935 [id number]  Encounter Medications:  Outpatient Encounter Medications as of 01/30/2018  Medication Sig  . aspirin EC 81 MG tablet Take 81 mg by mouth daily with breakfast.  . Calcium Carbonate-Vitamin D (CALCIUM 600+D) 600-400 MG-UNIT per tablet Take 1 tablet by mouth daily at 3 pm.   . cetirizine (ZYRTEC) 10 MG tablet Take 10 mg by mouth at bedtime.   Marland Kitchen diltiazem (TIAZAC) 120 MG 24 hr capsule Take 120 mg by mouth daily with breakfast.   . docusate sodium (COLACE) 100 MG capsule Take 1 capsule (100 mg total) by mouth daily.  . famotidine (PEPCID) 40 MG tablet Take 40 mg by mouth daily at 3 pm. 1600  . feeding supplement, ENSURE ENLIVE, (ENSURE ENLIVE) LIQD Take 237 mLs by mouth 3 (three) times daily with meals.  . furosemide (LASIX) 20 MG tablet Take 20 mg by mouth.  . hydrALAZINE (APRESOLINE) 50 MG tablet Take 50 mg by mouth 4 (four) times daily.  . methocarbamol (ROBAXIN) 500 MG tablet Take 500 mg by mouth every 6 (six) hours as needed for muscle spasms.  Marland Kitchen oxyCODONE (OXY IR/ROXICODONE) 5 MG immediate release tablet Take 5 mg by mouth See admin instructions. Every 30 mins before therapy  . Oxycodone HCl 10 MG TABS Take 10 mg by mouth every 4 (four) hours. Hold for drowsiness  . pantoprazole (PROTONIX) 40 MG tablet Take 1 tablet (40 mg total) by mouth daily.  . polyethylene glycol (MIRALAX / GLYCOLAX) packet Take 17 g by mouth 2 (two) times daily.  Marland Kitchen senna (SENOKOT) 8.6 MG TABS tablet Take 2 tablets (17.2 mg total) by mouth daily.  Marland Kitchen SYNTHROID 175 MCG tablet Take 175 mcg by mouth daily before breakfast.    No facility-administered encounter medications on file as of 01/30/2018.     Functional Status:  In your present state of health, do you have any difficulty performing the following activities: 12/29/2017 12/12/2017  Hearing? N -  Vision? N -  Difficulty concentrating or making  decisions? N -  Walking or climbing stairs? Y -  Dressing or bathing? Y -  Doing errands, shopping? N N  Some recent data might be hidden    Fall/Depression Screening:  PHQ 2/9 Scores 12/29/2017  PHQ - 2 Score 0    Assessment: CSW met with [facility] social worker [place] on 01/30/18 to coordinate care of patient. SNF social worker reports that she received a fax from [place] [facility] and [facility] requesting discharge plans and updates but [name] does not have this information at this time as no scheduled discharge date has been set as patient continues to make progress in therapy. SNF social worker agreeable to complete form once she receives needed information. CSW was informed that patient's 100th day at SNF will be 03/28/18 but we assume insurance will cut off before then. No further updates at this time.   CSW arrived at patient's room and completed SNF visit with her on 01/30/18. Patient reports that she is doing "somewhat better." She shares that her spouse is now home and that he has been doing well which has been a major relief for her as she was worried about his discharge back home from SNF alone. Patient reports that spouse travels daily to [place] to visit with patient. Patient denies receiving any further updates from [facility]ospice and Pall[facility] Care since she referred herself to their  services weeks last month. Patient reports that her current plan is to return back home from SNF instead of LTC placement. CSW provided education on how to ensure a safe and smooth transiton back home from SNF once it is time for her to discharge. Patient encouraged to consider hiring a private pay as well to help with that transition back home. CSW will continue to follow patient and await for SNF discharge and will then make refeerrals accordingly.   [facility] CM Care Plan Problem One     Most Recent Value  Care Plan Problem One  SNF admission to [facility]  Role Documenting  the Problem One  [place] for Problem One  Active  [place] Term Goal   Patient will have a safe and stable discharge back home from SNF within 90 days as evidenced by patient self report  THN Long Term Goal Start Date  12/29/17  Interventions for Problem One Long Term Goal  GOAL ONGOING. No discharge date yet. Coordinated with SNF social worker today during home visit as well.  THN CM Short Term Goal #1   Pt will attend all scheduled medical appointments over the next 30 days per pt self report  THN CM Short Term Goal #1 Start Date  01/30/18  Interventions for Short Term Goal #1  [facility] Calendar was provided today to assist patient with keeping up with appointments. Support will be provided to patient with meeting this goal.  THN CM Short Term Goal #2   Pt will participate in PT and OT over the next 30 days per pt self report  THN CM Short Term Goal #2 Start Date  01/30/18  Interventions for Short Term Goal #2  CSW provided motivational interviewing intervention during today's visit that patient was receptive to. Patient will be supported and encouraged throughout the next 30 days by CSW.     Plan:  CSW will follow up with SNF in two weeks and will continue to provide social work support and assistance.  [name], BSW, MSW, [email or web address] Phone: [phone] Fax: [phone]

## 2018-02-02 ENCOUNTER — Non-Acute Institutional Stay (SKILLED_NURSING_FACILITY): Payer: Medicare Other | Admitting: Internal Medicine

## 2018-02-02 ENCOUNTER — Encounter: Payer: Self-pay | Admitting: Internal Medicine

## 2018-02-02 DIAGNOSIS — I70245 Atherosclerosis of native arteries of left leg with ulceration of other part of foot: Secondary | ICD-10-CM | POA: Diagnosis not present

## 2018-02-02 DIAGNOSIS — L89621 Pressure ulcer of left heel, stage 1: Secondary | ICD-10-CM | POA: Diagnosis not present

## 2018-02-02 DIAGNOSIS — A498 Other bacterial infections of unspecified site: Secondary | ICD-10-CM

## 2018-02-02 DIAGNOSIS — N3000 Acute cystitis without hematuria: Secondary | ICD-10-CM

## 2018-02-02 NOTE — Progress Notes (Signed)
Location:  Central Room Number: 101P Place of Service:  SNF (31) Noah Delaine. Sheppard Coil, MD  [name], MD  Patient Care Team: [name], MD as PCP - General (Internal Medicine) Greg Cutter, LCSW as [facility] Management (Licensed Clinical Social Worker)  Extended Emergency Contact Information Primary Emergency Contact: Wacker,Michael Address: [address]          [place], [place] [address] Johnnette Litter of [place] Phone: [phone] Relation: Spouse    Allergies: Cinnamon; Ciprofloxacin; Diovan [valsartan]; Food; Latex; Nitrofuran derivatives; Penicillins; Bactrim [sulfamethoxazole-trimethoprim]; Other; and Sulfa antibiotics  Chief Complaint  Patient presents with  . Acute Visit    UTI    HPI: Patient is 83 y.o. female who is being seen for a UTI.  Several days ago patient was complaining of burning with urination, urgency, and a strong smell to her urine.  Patient has not had any fever, nausea or vomiting.  His urine culture returned today showing greater than 100,000 of Serratia sensitive to cephalosporins which is nice because patient is allergic to everything else.  Past Medical History:  Diagnosis Date  . Allergy   . Anemia   . CKD (chronic kidney disease), stage II   . Fibromyalgia   . GERD (gastroesophageal reflux disease)   . Hiatal hernia   . Hypertension   . Hypothyroid   . Lymphedema    venous insufficency  . Osteoarthritis   . Peripheral vascular disease ([place])   . Ulcer of knee ([place])    right  . Urinary incontinence   . Varicose veins   . Venous insufficiency     Past Surgical History:  Procedure Laterality Date  . AMPUTATION Right 12/15/2017   Procedure: AMPUTATION ABOVE KNEE, RIGHT;  Surgeon: Elam Dutch, MD;  Location: [facility];  Service: Vascular;  Laterality: Right;  . APPLICATION OF WOUND VAC Right 07/18/2017   Procedure: APPLICATION OF WOUND VAC;  Surgeon: [name], MD;   Location: [facility];  Service: Orthopedics;  Laterality: Right;  . CATARACT EXTRACTION W/ INTRAOCULAR LENS  IMPLANT, BILATERAL    . COLONOSCOPY    . I&D EXTREMITY Right 07/18/2017   Procedure: IRRIGATION AND DEBRIDEMENT RIGHT KNEE;  Surgeon: [name], MD;  Location: [facility];  Service: Orthopedics;  Laterality: Right;  . I&D EXTREMITY Right 07/23/2017   Procedure: REPEAT IRRIGATION AND DEBRIDEMENT RIGHT KNEE;  Surgeon: [name], MD;  Location: [facility];  Service: Orthopedics;  Laterality: Right;  . I&D EXTREMITY Right 11/28/2017   Procedure: IRRIGATION AND DEBRIDEMENT RIGHT KNEE , APPLY INSTILLATION VAC;  Surgeon: [name], MD;  Location: [facility];  Service: Orthopedics;  Laterality: Right;  . I&D EXTREMITY Right 12/03/2017   Procedure: REPEAT IRRIGATION AND DEBRIDEMENT RIGHT KNEE AND APPLICATION OF A WOUND VAC.;  Surgeon: [name], MD;  Location: [facility];  Service: Orthopedics;  Laterality: Right;  . KNEE ARTHROSCOPY Left    menisectomy  . MULTIPLE TOOTH EXTRACTIONS    . SKIN SPLIT GRAFT Right 07/25/2017   Procedure: Repeat Irrigation and Debridement Right Knee, Split Thickness Skin Graft;  Surgeon: [name], MD;  Location: [facility];  Service: Orthopedics;  Laterality: Right;  . SKIN SPLIT GRAFT Right 12/05/2017   Procedure: SKIN GRAFT SPLIT THICKNESS WOUND KNEE, APPLY VAC;  Surgeon: [name], MD;  Location: [facility];  Service: Orthopedics;  Laterality: Right;  . TONSILLECTOMY      Allergies as of 02/02/2018  Reactions   Cinnamon Hives   Ciprofloxacin Other (See Comments)   TREMORS   Diovan [valsartan] Other (See Comments)   Extreme vertigo   Food Diarrhea, Other (See Comments)   ORANGE JUICE   UPSET STOMACH   Latex Rash, Other (See Comments)   Rash/inflammation due to exposure   Nitrofuran Derivatives Hives, Rash   "Full body rash"   Penicillins Hives, Swelling   *tolerated Ceftriaxone September 2018 Has patient had a PCN reaction causing immediate rash,  facial/tongue/throat swelling, SOB or lightheadedness with hypotension:No--severe irritation at the injection site Has patient had a PCN reaction causing severe rash involving mucus membranes or skin necrosis:Unknown Has patient had a PCN reaction that required hospitalization:No Has patient had a PCN reaction occurring within the last 10 years:Yes If all of the above answers are "NO", then may proceed with   Bactrim [sulfamethoxazole-trimethoprim] Diarrhea, Nausea Only   Other Rash   Mycins, Strawberry, Oranges   Sulfa Antibiotics Diarrhea, Nausea Only      Medication List        Accurate as of 02/02/18  4:24 PM. Always use your most recent med list.          amitriptyline 50 MG tablet Commonly known as:  ELAVIL Take 50 mg by mouth at bedtime.   aspirin EC 81 MG tablet Take 81 mg by mouth daily with breakfast.   benazepril 40 MG tablet Commonly known as:  LOTENSIN Take 40 mg by mouth daily.   CALCIUM 600+D 600-400 MG-UNIT tablet Generic drug:  Calcium Carbonate-Vitamin D Take 1 tablet by mouth daily at 3 pm.   cetirizine 10 MG tablet Commonly known as:  ZYRTEC Take 10 mg by mouth at bedtime.   diltiazem 120 MG 24 hr capsule Commonly known as:  TIAZAC Take 120 mg by mouth daily with breakfast.   docusate sodium 100 MG capsule Commonly known as:  COLACE Take 1 capsule (100 mg total) by mouth daily.   escitalopram 10 MG tablet Commonly known as:  LEXAPRO Take 10 mg by mouth daily.   famotidine 40 MG tablet Commonly known as:  PEPCID Take 40 mg by mouth daily at 3 pm. 1600   feeding supplement (ENSURE ENLIVE) Liqd Take 237 mLs by mouth 3 (three) times daily with meals.   fentaNYL 25 MCG/HR patch Commonly known as:  DURAGESIC - dosed mcg/hr Place 25 mcg onto the skin every 3 (three) days.   ferrous sulfate 325 (65 FE) MG tablet Take 325 mg by mouth 2 (two) times daily with a meal.   furosemide 20 MG tablet Commonly known as:  LASIX Take 20 mg by mouth  daily.   hydrALAZINE 50 MG tablet Commonly known as:  APRESOLINE Take 50 mg by mouth 4 (four) times daily.   methocarbamol 500 MG tablet Commonly known as:  ROBAXIN Take 500 mg by mouth every 6 (six) hours as needed for muscle spasms.   multivitamin with minerals tablet Take 1 tablet by mouth daily.   oxyCODONE 5 MG immediate release tablet Commonly known as:  Oxy IR/ROXICODONE Take 5 mg by mouth every 4 (four) hours as needed. Every 30 mins before therapy   pantoprazole 40 MG tablet Commonly known as:  PROTONIX Take 1 tablet (40 mg total) by mouth daily.   polyethylene glycol packet Commonly known as:  MIRALAX / GLYCOLAX Take 17 g by mouth 2 (two) times daily.   SENNA-PLUS 8.6-50 MG tablet Generic drug:  senna-docusate Take 2 tablets by mouth 2 (two) times  daily.   SYNTHROID 175 MCG tablet Generic drug:  levothyroxine Take 175 mcg by mouth daily before breakfast.   vitamin C 500 MG tablet Commonly known as:  ASCORBIC ACID Take 500 mg by mouth daily.       No orders of the defined types were placed in this encounter.   Immunization History  Administered Date(s) Administered  . Influenza-Unspecified 12/08/2017  . Tdap 06/28/2015    Social History   Tobacco Use  . Smoking status: Former Smoker    Packs/day: 0.50    Years: 10.00    Pack years: 5.00    Last attempt to quit: 11/19/1979    Years since quitting: 38.2  . Smokeless tobacco: Never Used  Substance Use Topics  . Alcohol use: Yes    Alcohol/week: 8.4 oz    Types: 14 Glasses of wine per week    Comment: 2 glasses of wine with dinner    Review of Systems  DATA OBTAINED: from patient, nurse GENERAL:  no fevers, fatigue, appetite changes SKIN: No itching, rash HEENT: No complaint RESPIRATORY: No cough, wheezing, SOB CARDIAC: No chest pain, palpitations, lower extremity edema  GI: No abdominal pain, No N/V/D or constipation, No heartburn or reflux  GU: No dysuria, frequency or urgency, or  incontinence  MUSCULOSKELETAL: No unrelieved bone/joint pain NEUROLOGIC: No headache, dizziness  PSYCHIATRIC: No overt anxiety or sadness  Vitals:   02/02/18 1609  BP: 137/72  Pulse: 85  Resp: 18  Temp: 98 F (36.7 C)  SpO2: 95%   Body mass index is 34.15 kg/m. Physical Exam  GENERAL APPEARANCE: Alert, conversant, No acute distress  SKIN: No diaphoresis rash HEENT: Unremarkable RESPIRATORY: Breathing is even, unlabored. Lung sounds are clear   CARDIOVASCULAR: Heart RRR no murmurs, rubs or gallops. No peripheral edema  GASTROINTESTINAL: Abdomen is soft, non-tender, not distended w/ normal bowel sounds.  GENITOURINARY: Bladder non tender, not distended  MUSCULOSKELETAL: Right AKA NEUROLOGIC: Cranial nerves 2-12 grossly intact. Moves all extremities PSYCHIATRIC: Mood and affect appropriate to situation, flat affect, no behavioral issues  Patient Active Problem List   Diagnosis Date Noted  . Chronic ulcer of right heel with necrosis of bone ([place]) 12/27/2017  . Peripheral vascular disease of lower extremity with ulceration ([place]) 12/27/2017  . Acute renal failure superimposed on stage 2 chronic kidney disease ([place]) 12/27/2017  . GERD (gastroesophageal reflux disease) 12/27/2017  . Acute blood loss as cause of postoperative anemia 12/13/2017  . Wound of right leg, sequela 12/12/2017  . Open wound of right knee 11/28/2017  . Allergic drug rash   . Sepsis ([place]) 08/29/2017  . CKD (chronic kidney disease), stage III ([place]) 08/29/2017  . Hypothyroid 08/29/2017  . Hypertension 08/29/2017  . Peripheral vascular disease ([place]) 08/29/2017  . Skin ulcer of knee, right, with fat layer exposed ([place]) 08/29/2017  . Acute kidney injury ([place]) 08/29/2017  . Metabolic acidosis 32/95/1884  . Acute hyponatremia 08/29/2017  . Pressure injury of skin 07/25/2017  . Wound, open, knee, lower leg, or ankle with complication, right, initial encounter   . Idiopathic chronic venous hypertension of both  lower extremities with ulcer and inflammation ([place]) 07/15/2017  . Skin ulcer of right knee with necrosis of muscle ([place]) 07/15/2017  . Atherosclerosis of artery of right lower extremity ([place]) 07/11/2017  . Essential hypertension 07/11/2017  . Osteoporosis 07/11/2017  . Cataracts, bilateral 07/11/2017  . Hypothyroidism, acquired 12/27/2014  . Iron deficiency anemia 12/27/2014  . Hiatal hernia 12/27/2014  . Varicose veins of lower  extremities with other complications 35/32/9924    CMP     Component Value Date/Time   NA 136 12/22/2017 0843   NA 133 (A) 12/11/2017   NA 140 04/15/2013 1516   K 3.3 (L) 12/22/2017 0843   K 4.3 04/15/2013 1516   CL 99 (L) 12/22/2017 0843   CL 105 04/15/2013 1516   CO2 23 12/22/2017 0843   CO2 25 04/15/2013 1516   GLUCOSE 85 12/22/2017 0843   GLUCOSE 94 04/15/2013 1516   BUN 10 12/22/2017 0843   BUN 16 12/11/2017   BUN 28.2 (H) 04/15/2013 1516   CREATININE 0.93 12/22/2017 0843   CREATININE 1.21 (H) 12/25/2014 1530   CREATININE 1.4 (H) 04/15/2013 1516   CALCIUM 8.6 (L) 12/22/2017 0843   CALCIUM 9.9 04/15/2013 1516   PROT 5.7 (L) 12/17/2017 0533   PROT 7.8 04/15/2013 1516   ALBUMIN 2.0 (L) 12/22/2017 0843   ALBUMIN 3.7 04/15/2013 1516   AST 21 12/17/2017 0533   AST 17 04/15/2013 1516   ALT 13 (L) 12/17/2017 0533   ALT 15 04/15/2013 1516   ALKPHOS 65 12/17/2017 0533   ALKPHOS 73 04/15/2013 1516   BILITOT 0.7 12/17/2017 0533   BILITOT 0.25 04/15/2013 1516   GFRNONAA 57 (L) 12/22/2017 0843   GFRNONAA 43 (L) 12/25/2014 1530   GFRAA >60 12/22/2017 0843   GFRAA 50 (L) 12/25/2014 1530   Recent Labs    08/29/17 1756  12/19/17 0542 12/20/17 0503 12/22/17 0843  NA  --    < > 133* 133* 136  K  --    < > 3.6 3.6 3.3*  CL  --    < > 102 102 99*  CO2  --    < > 23 22 23   GLUCOSE  --    < > 92 105* 85  BUN  --    < > 18 16 10   CREATININE  --    < > 1.04* 1.00 0.93  CALCIUM  --    < > 8.1* 8.1* 8.6*  MG 1.8  --   --   --   --   PHOS  --   --    --   --  2.8   < > = values in this interval not displayed.   Recent Labs    12/12/17 1527  12/15/17 0716 12/17/17 0533 12/22/17 0843  AST 18  --  18 21  --   ALT 13*  --  14 13*  --   ALKPHOS 69  --  70 65  --   BILITOT 0.2*  --  0.6 0.7  --   PROT 6.3*  --  6.3* 5.7*  --   ALBUMIN 2.5*   < > 2.4* 2.0* 2.0*   < > = values in this interval not displayed.   Recent Labs    12/11/17 12/12/17 1527  12/19/17 0542 12/20/17 0503 12/22/17 0843  WBC 13.3 14.5*   < > 11.4* 10.3 13.5*  NEUTROABS 10 11.0*  --   --   --  10.5*  HGB 7.2* 6.7*   < > 7.2* 7.3* 10.5*  HCT 23* 21.9*   < > 24.0* 23.3* 33.6*  MCV  --  79.6   < > 83.6 83.5 82.0  PLT 498* 459*   < > 394 395 505*   < > = values in this interval not displayed.   No results for input(s): CHOL, LDLCALC, TRIG in the last 8760 hours.  Invalid input(s): HCL  No results found for: [facility] Lab Results  Component Value Date   TSH 0.298 (L) 09/09/2017   Lab Results  Component Value Date   HGBA1C 5.6 12/12/2017   No results found for: CHOL, HDL, LDLCALC, LDLDIRECT, TRIG, CHOLHDL  Significant Diagnostic Results in last 30 days:  No results found.  Assessment and Plan  Serratia UTI-greater than 100,000 Serratia fonticola; patient is allergic to Bactrim, Cipro, Macrobid, sulfa, linezolid; fortunately is sensitive to cephalosporin, will start Omnicef 300 mg every 12 times 7 days; monitor response    Webb Silversmith D. Sheppard Coil, MD

## 2018-02-03 ENCOUNTER — Other Ambulatory Visit: Payer: Self-pay | Admitting: Licensed Clinical Social Worker

## 2018-02-03 NOTE — Patient Outreach (Signed)
[facility]) Care Management  [facility] Care Manager  02/03/2018   Shelby Reese 08/21/1935 [id number]  Encounter Medications:  Outpatient Encounter Medications as of 02/03/2018  Medication Sig  . amitriptyline (ELAVIL) 50 MG tablet Take 50 mg by mouth at bedtime.  Mar[place] Kitchen aspirin EC 81 MG tablet Take 81 mg by mouth daily with breakfast.  . benazepril (LOTENSIN) 40 MG tablet Take 40 mg by mouth daily.  . Calcium Carbonate-Vitamin D (CALCIUM 600+D) 600-400 MG-UNIT per tablet Take 1 tablet by mouth daily at 3 pm.   . cetirizine (ZYRTEC) 10 MG tablet Take 10 mg by mouth at bedtime.   Marland Kitchen diltiazem (TIAZAC) 120 MG 24 hr capsule Take 120 mg by mouth daily with breakfast.   . docusate sodium (COLACE) 100 MG capsule Take 1 capsule (100 mg total) by mouth daily.  Marland Kitchen escitalopram (LEXAPRO) 10 MG tablet Take 10 mg by mouth daily.  . famotidine (PEPCID) 40 MG tablet Take 40 mg by mouth daily at 3 pm. 1600  . feeding supplement, ENSURE ENLIVE, (ENSURE ENLIVE) LIQD Take 237 mLs by mouth 3 (three) times daily with meals.  . fentaNYL (DURAGESIC - DOSED MCG/HR) 25 MCG/HR patch Place 25 mcg onto the skin every 3 (three) days.  . ferrous sulfate 325 (65 FE) MG tablet Take 325 mg by mouth 2 (two) times daily with a meal.  . furosemide (LASIX) 20 MG tablet Take 20 mg by mouth daily.   . hydrALAZINE (APRESOLINE) 50 MG tablet Take 50 mg by mouth 4 (four) times daily.  . methocarbamol (ROBAXIN) 500 MG tablet Take 500 mg by mouth every 6 (six) hours as needed for muscle spasms.  . Multiple Vitamins-Minerals (MULTIVITAMIN WITH MINERALS) tablet Take 1 tablet by mouth daily.  Marland Kitchen oxyCODONE (OXY IR/ROXICODONE) 5 MG immediate release tablet Take 5 mg by mouth every 4 (four) hours as needed. Every 30 mins before therapy   . pantoprazole (PROTONIX) 40 MG tablet Take 1 tablet (40 mg total) by mouth daily.  . polyethylene glycol (MIRALAX / GLYCOLAX) packet Take 17 g by mouth 2 (two) times daily.  Marland Kitchen senna-docusate  (SENNA-PLUS) 8.6-50 MG tablet Take 2 tablets by mouth 2 (two) times daily.  [place] Kitchen SYNTHROID 175 MCG tablet Take 175 mcg by mouth daily before breakfast.   . vitamin C (ASCORBIC ACID) 500 MG tablet Take 500 mg by mouth daily.   No facility-administered encounter medications on file as of 02/03/2018.     Functional Status:  In your present state of health, do you have any difficulty performing the following activities: 12/29/2017 12/12/2017  Hearing? N -  Vision? N -  Difficulty concentrating or making decisions? N -  Walking or climbing stairs? Y -  Dressing or bathing? Y -  Doing errands, shopping? N N  Some recent data might be hidden    Fall/Depression Screening: Fall Risk  12/29/2017 07/15/2016  Falls in the past year? Yes No  Comment - [place] Telephone Survey: data to providers prior to load  Number falls in past yr: 2 or more -  Injury with Fall? Yes -  Risk Factor Category  High Fall Risk -  Risk for fall due to : Impaired balance/gait -  Follow up Education provided -   PHQ 2/9 Scores 02/03/2018 12/29/2017  PHQ - 2 Score 2 0  PHQ- 9 Score 4 -    Assessment: CSW arrived at [facility] and stopped by patient's room to follow up on SNF discharge planning. [facility] CSW  had two new [facility] BSW's shadowing her role for the day and patient provided consent that they could be present during today's session. Patient reports that her plan now is to return home from SNF once her insurance cuts off coverage and then her and her spouse will make plans to relocate to a facility long term. She shares that she will need to spend time at home handling her affairs, going through her belongings and getting rid of what is no longer needed in order to eventually sell their home before relocating with spouse to a local ALF. CSW questioned if patient had any ALF's in mind that she would like to eventually be placed at. [facility] Post Acute Coordinator asked that [place] bring a list of ALF's in [facility] to patient  during next SNF visit and CSW is happy to do so.   [facility] CM Care Plan Problem One     Most Recent Value  Care Plan Problem One  SNF admission to [facility]  Role Documenting the Problem One  [place] for Problem One  Active  [place] Term Goal   Patient will have a safe and stable discharge back home from SNF within 90 days as evidenced by patient self report  [facility] Long Term Goal Start Date  12/29/17  Interventions for Problem One Long Term Goal  No discharge date set yet. Coordination took place between [facility] CSW and [place] CM Short Term Goal #1   Pt will attend all scheduled medical appointments over the next 30 days per pt self report  [facility] CM Short Term Goal #1 Start Date  01/30/18  Interventions for Short Term Goal #1  Future medical appointments reviewed with patient during today's visit.  [facility] CM Short Term Goal #2   Pt will participate in PT and OT over the next 30 days per pt self report  [facility] CM Short Term Goal #2 Start Date  01/30/18  Interventions for Short Term Goal #2  Patient continues to attend all PT/OT sessions. Positive reinforcement provided      Plan: CSW will complete SNF visit within two weeks and will provide a handout on local ALF's in [facility].   [name], BSW, MSW, [email or web address] Phone: [phone] Fax: [phone]

## 2018-02-07 ENCOUNTER — Encounter: Payer: Self-pay | Admitting: Internal Medicine

## 2018-02-09 DIAGNOSIS — L89621 Pressure ulcer of left heel, stage 1: Secondary | ICD-10-CM | POA: Diagnosis not present

## 2018-02-09 DIAGNOSIS — I70245 Atherosclerosis of native arteries of left leg with ulceration of other part of foot: Secondary | ICD-10-CM | POA: Diagnosis not present

## 2018-02-10 ENCOUNTER — Other Ambulatory Visit: Payer: Self-pay | Admitting: Licensed Clinical Social Worker

## 2018-02-10 NOTE — Patient Outreach (Signed)
[place]) Care Management  02/10/2018  SAFIATOU ISLAM August 27, 1935 [id number]  Assessment- CSW arrived at [facility] to complete visit with patient. CSW went to SNF social worker's office first and discussed case. CSW was informed that they had a long discussion with patient yesterday and have made the decision that it is not safe for her to return home at this time. CSW was informed that her discharge date set for 02/12/18 is no longer scheduled. CSW was also informed that patient is currently going through the appeal process through her insurance.   CSW arrived to patient's room but was informed by nurse that it was not a good time to visit with patient. Patient's door closed with several care providers and patient seemed to be "in distress."   Plan-CSW will update [facility] Post Acute Coordinator and follow up with SNF within two weeks.  [name], BSW, MSW, [email or web address] Phone: [phone] Fax: [phone]

## 2018-02-16 ENCOUNTER — Encounter: Payer: Self-pay | Admitting: Internal Medicine

## 2018-02-16 ENCOUNTER — Non-Acute Institutional Stay (SKILLED_NURSING_FACILITY): Payer: Medicare Other | Admitting: Internal Medicine

## 2018-02-16 DIAGNOSIS — L97909 Non-pressure chronic ulcer of unspecified part of unspecified lower leg with unspecified severity: Secondary | ICD-10-CM | POA: Diagnosis not present

## 2018-02-16 DIAGNOSIS — I739 Peripheral vascular disease, unspecified: Secondary | ICD-10-CM

## 2018-02-16 DIAGNOSIS — F0631 Mood disorder due to known physiological condition with depressive features: Secondary | ICD-10-CM

## 2018-02-16 DIAGNOSIS — A419 Sepsis, unspecified organism: Secondary | ICD-10-CM

## 2018-02-16 DIAGNOSIS — I96 Gangrene, not elsewhere classified: Secondary | ICD-10-CM

## 2018-02-16 DIAGNOSIS — E034 Atrophy of thyroid (acquired): Secondary | ICD-10-CM

## 2018-02-16 DIAGNOSIS — N182 Chronic kidney disease, stage 2 (mild): Secondary | ICD-10-CM | POA: Diagnosis not present

## 2018-02-16 DIAGNOSIS — D62 Acute posthemorrhagic anemia: Secondary | ICD-10-CM | POA: Diagnosis not present

## 2018-02-16 DIAGNOSIS — I10 Essential (primary) hypertension: Secondary | ICD-10-CM

## 2018-02-16 DIAGNOSIS — L89621 Pressure ulcer of left heel, stage 1: Secondary | ICD-10-CM | POA: Diagnosis not present

## 2018-02-16 DIAGNOSIS — I70245 Atherosclerosis of native arteries of left leg with ulceration of other part of foot: Secondary | ICD-10-CM | POA: Diagnosis not present

## 2018-02-16 DIAGNOSIS — N179 Acute kidney failure, unspecified: Secondary | ICD-10-CM | POA: Diagnosis not present

## 2018-02-16 DIAGNOSIS — K219 Gastro-esophageal reflux disease without esophagitis: Secondary | ICD-10-CM

## 2018-02-16 DIAGNOSIS — S81801S Unspecified open wound, right lower leg, sequela: Secondary | ICD-10-CM | POA: Diagnosis not present

## 2018-02-16 NOTE — Progress Notes (Signed)
Location:  [facility] Room Number: 154M Place of Service:  SNF [phone]) Noah Delaine. Sheppard Coil, MD  PCP: [name], MD Patient Care Team: [name], MD as PCP - General (Internal Medicine) Valente David, RN as [place] Management  Extended Emergency Contact Information Primary Emergency Contact: Rupert,Michael Address: [address]          [place], [place] [address] Johnnette Litter of [place] Phone: [phone] Relation: Spouse  Allergies  Allergen Reactions  . Cinnamon Hives  . Ciprofloxacin Other (See Comments)    TREMORS  . Diovan [Valsartan] Other (See Comments)    Extreme vertigo  . Food Diarrhea and Other (See Comments)    ORANGE JUICE   UPSET STOMACH  . Latex Rash and Other (See Comments)    Rash/inflammation due to exposure  . Nitrofuran Derivatives Hives and Rash    "Full body rash"  . Penicillins Hives and Swelling    *tolerated Ceftriaxone September 2018 Has patient had a PCN reaction causing immediate rash, facial/tongue/throat swelling, SOB or lightheadedness with hypotension:No--severe irritation at the injection site Has patient had a PCN reaction causing severe rash involving mucus membranes or skin necrosis:Unknown Has patient had a PCN reaction that required hospitalization:No Has patient had a PCN reaction occurring within the last 10 years:Yes If all of the above answers are "NO", then may proceed with  . Bactrim [Sulfamethoxazole-Trimethoprim] Diarrhea and Nausea Only  . Other Rash    Mycins, Strawberry, Oranges  . Sulfa Antibiotics Diarrhea and Nausea Only    Chief Complaint  Patient presents with  . Discharge Note    Discharged from SNF    HPI:  83 y.o. female with chronic kidney disease stage II, hypertension, and obesity who had a chronic wound to her right leg.  She had undergone multiple debridements and skin grafts on the right leg per Dr. do during the past 4 months.  At skilled nursing  facility she had increased pain and increased drainage and odor from the wound and was sent to the emergency department and sent back with the understanding that she was see Dr. due to the next day.  She did go to Dr. due to his office eventually and it was decided she needed an AKA.  Patient decided that she would undergo the AKA but wanted vascular surgery to do it.  Patient was admitted to [facility] from 1/25-2/5 where she underwent a right AKA which took place on 2/4.  Patient also needs a left AKA but she has related to that she does not wish to have this at this time.  Patient was admitted to skilled nursing facility for OT/PT.  Patient is now ready to be discharged to [facility] assisted living facility.    Past Medical History:  Diagnosis Date  . Allergy   . Anemia   . CKD (chronic kidney disease), stage II   . Fibromyalgia   . GERD (gastroesophageal reflux disease)   . Hiatal hernia   . Hypertension   . Hypothyroid   . Lymphedema    venous insufficency  . Osteoarthritis   . Peripheral vascular disease ([place])   . Ulcer of knee ([place])    right  . Urinary incontinence   . Varicose veins   . Venous insufficiency     Past Surgical History:  Procedure Laterality Date  . AMPUTATION Right 12/15/2017   Procedure: AMPUTATION ABOVE KNEE, RIGHT;  Surgeon: Elam Dutch, MD;  Location: [facility] OR;  Service: Vascular;  Laterality: Right;  . APPLICATION OF WOUND VAC Right 07/18/2017   Procedure: APPLICATION OF WOUND VAC;  Surgeon: [name], MD;  Location: [facility];  Service: Orthopedics;  Laterality: Right;  . CATARACT EXTRACTION W/ INTRAOCULAR LENS  IMPLANT, BILATERAL    . COLONOSCOPY    . I&D EXTREMITY Right 07/18/2017   Procedure: IRRIGATION AND DEBRIDEMENT RIGHT KNEE;  Surgeon: [name], MD;  Location: [facility];  Service: Orthopedics;  Laterality: Right;  . I&D EXTREMITY Right 07/23/2017   Procedure: REPEAT IRRIGATION AND DEBRIDEMENT RIGHT KNEE;  Surgeon:  [name], MD;  Location: [facility];  Service: Orthopedics;  Laterality: Right;  . I&D EXTREMITY Right 11/28/2017   Procedure: IRRIGATION AND DEBRIDEMENT RIGHT KNEE , APPLY INSTILLATION VAC;  Surgeon: [name], MD;  Location: [facility];  Service: Orthopedics;  Laterality: Right;  . I&D EXTREMITY Right 12/03/2017   Procedure: REPEAT IRRIGATION AND DEBRIDEMENT RIGHT KNEE AND APPLICATION OF A WOUND VAC.;  Surgeon: [name], MD;  Location: [facility];  Service: Orthopedics;  Laterality: Right;  . KNEE ARTHROSCOPY Left    menisectomy  . MULTIPLE TOOTH EXTRACTIONS    . SKIN SPLIT GRAFT Right 07/25/2017   Procedure: Repeat Irrigation and Debridement Right Knee, Split Thickness Skin Graft;  Surgeon: [name], MD;  Location: [facility];  Service: Orthopedics;  Laterality: Right;  . SKIN SPLIT GRAFT Right 12/05/2017   Procedure: SKIN GRAFT SPLIT THICKNESS WOUND KNEE, APPLY VAC;  Surgeon: [name], MD;  Location: [facility];  Service: Orthopedics;  Laterality: Right;  . TONSILLECTOMY       reports that she quit smoking about 38 years ago. She has a 5.00 pack-year smoking history. She has never used smokeless tobacco. She reports that she drinks about 8.4 oz of alcohol per week. She reports that she does not use drugs. Social History   Socioeconomic History  . Marital status: Married    Spouse name: Not on file  . Number of children: 0  . Years of education: Not on file  . Highest education level: Not on file  Occupational History    Comment: retired Marine scientist.   Social Needs  . Financial resource strain: Not on file  . Food insecurity:    Worry: Not on file    Inability: Not on file  . Transportation needs:    Medical: Not on file    Non-medical: Not on file  Tobacco Use  . Smoking status: Former Smoker    Packs/day: 0.50    Years: 10.00    Pack years: 5.00    Last attempt to quit: 11/19/1979    Years since quitting: 38.2  . Smokeless tobacco: Never Used  Substance and Sexual Activity  .  Alcohol use: Yes    Alcohol/week: 8.4 oz    Types: 14 Glasses of wine per week    Comment: 2 glasses of wine with dinner  . Drug use: No  . Sexual activity: Not on file  Lifestyle  . Physical activity:    Days per week: Not on file    Minutes per session: Not on file  . Stress: Not on file  Relationships  . Social connections:    Talks on phone: Not on file    Gets together: Not on file    Attends religious service: Not on file    Active member of club or organization: Not on file    Attends meetings of  clubs or organizations: Not on file    Relationship status: Not on file  . Intimate partner violence:    Fear of current or ex partner: Not on file    Emotionally abused: Not on file    Physically abused: Not on file    Forced sexual activity: Not on file  Other Topics Concern  . Not on file  Social History Narrative  . Not on file    Pertinent  Health Maintenance Due  Topic Date Due  . DEXA SCAN  09/24/2002  . PNA vac Low Risk Adult (1 of 2 - PCV13) 09/24/2002  . INFLUENZA VACCINE  06/18/2018    Medications: Allergies as of 02/16/2018      Reactions   Cinnamon Hives   Ciprofloxacin Other (See Comments)   TREMORS   Diovan [valsartan] Other (See Comments)   Extreme vertigo   Food Diarrhea, Other (See Comments)   ORANGE JUICE   UPSET STOMACH   Latex Rash, Other (See Comments)   Rash/inflammation due to exposure   Nitrofuran Derivatives Hives, Rash   "Full body rash"   Penicillins Hives, Swelling   *tolerated Ceftriaxone September 2018 Has patient had a PCN reaction causing immediate rash, facial/tongue/throat swelling, SOB or lightheadedness with hypotension:No--severe irritation at the injection site Has patient had a PCN reaction causing severe rash involving mucus membranes or skin necrosis:Unknown Has patient had a PCN reaction that required hospitalization:No Has patient had a PCN reaction occurring within the last 10 years:Yes If all of the above answers are  "NO", then may proceed with   Bactrim [sulfamethoxazole-trimethoprim] Diarrhea, Nausea Only   Other Rash   Mycins, Strawberry, Oranges   Sulfa Antibiotics Diarrhea, Nausea Only      Medication List        Accurate as of 02/16/18 11:59 PM. Always use your most recent med list.          amitriptyline 50 MG tablet Commonly known as:  ELAVIL Take 50 mg by mouth at bedtime.   aspirin EC 81 MG tablet Take 81 mg by mouth daily with breakfast.   benazepril 40 MG tablet Commonly known as:  LOTENSIN Take 40 mg by mouth daily.   CALCIUM 600+D 600-400 MG-UNIT tablet Generic drug:  Calcium Carbonate-Vitamin D Take 1 tablet by mouth daily at 3 pm.   cetirizine 10 MG tablet Commonly known as:  ZYRTEC Take 10 mg by mouth at bedtime.   diltiazem 120 MG 24 hr capsule Commonly known as:  TIAZAC Take 120 mg by mouth daily with breakfast.   docusate sodium 100 MG capsule Commonly known as:  COLACE Take 1 capsule (100 mg total) by mouth daily.   escitalopram 10 MG tablet Commonly known as:  LEXAPRO Take 10 mg by mouth daily.   famotidine 40 MG tablet Commonly known as:  PEPCID Take 40 mg by mouth daily at 3 pm. 1600   feeding supplement (ENSURE ENLIVE) Liqd Take 237 mLs by mouth 3 (three) times daily with meals.   fentaNYL 25 MCG/HR patch Commonly known as:  DURAGESIC - dosed mcg/hr Place 25 mcg onto the skin every 3 (three) days.   ferrous sulfate 325 (65 FE) MG tablet Take 325 mg by mouth 2 (two) times daily with a meal.   furosemide 20 MG tablet Commonly known as:  LASIX Take 20 mg by mouth daily.   hydrALAZINE 50 MG tablet Commonly known as:  APRESOLINE Take 50 mg by mouth 4 (four) times daily.   methocarbamol 500  MG tablet Commonly known as:  ROBAXIN Take 500 mg by mouth every 6 (six) hours as needed for muscle spasms.   multivitamin with minerals tablet Take 1 tablet by mouth daily.   oxyCODONE 5 MG immediate release tablet Commonly known as:  Oxy  IR/ROXICODONE Take 5 mg by mouth every 4 (four) hours. Every 30 mins before therapy   pantoprazole 40 MG tablet Commonly known as:  PROTONIX Take 1 tablet (40 mg total) by mouth daily.   polyethylene glycol packet Commonly known as:  MIRALAX / GLYCOLAX Take 17 g by mouth 2 (two) times daily.   SENNA-PLUS 8.6-50 MG tablet Generic drug:  senna-docusate Take 2 tablets by mouth 2 (two) times daily.   SYNTHROID 175 MCG tablet Generic drug:  levothyroxine Take 175 mcg by mouth daily before breakfast.   vitamin C 500 MG tablet Commonly known as:  ASCORBIC ACID Take 500 mg by mouth daily.        Vitals:   02/16/18 1101  BP: 114/62  Pulse: 82  Resp: 20  Temp: 97.6 F (36.4 C)  TempSrc: Oral  SpO2: 92%  Weight: 187 lb 3.2 oz (84.9 kg)  Height: 5\' 3"  (1.6 m)   Body mass index is 33.16 kg/m.  Physical Exam  GENERAL APPEARANCE: Alert, conversant. No acute distress.  HEENT: Unremarkable. RESPIRATORY: Breathing is even, unlabored. Lung sounds are clear   CARDIOVASCULAR: Heart RRR no murmurs, rubs or gallops. No peripheral edema.  GASTROINTESTINAL: Abdomen is soft, non-tender, not distended w/ normal bowel sounds.  NEUROLOGIC: Cranial nerves 2-12 grossly intact. Moves all extremities   Labs reviewed: Basic Metabolic Panel: Recent Labs    08/29/17 1756  12/19/17 0542 12/20/17 0503 12/22/17 0843  NA  --    < > 133* 133* 136  K  --    < > 3.6 3.6 3.3*  CL  --    < > 102 102 99*  CO2  --    < > 23 22 23   GLUCOSE  --    < > 92 105* 85  BUN  --    < > 18 16 10   CREATININE  --    < > 1.04* 1.00 0.93  CALCIUM  --    < > 8.1* 8.1* 8.6*  MG 1.8  --   --   --   --   PHOS  --   --   --   --  2.8   < > = values in this interval not displayed.   No results found for: [facility] Liver Function Tests: Recent Labs    12/12/17 1527  12/15/17 0716 12/17/17 0533 12/22/17 0843  AST 18  --  18 21  --   ALT 13*  --  14 13*  --   ALKPHOS 69  --  70 65  --   BILITOT 0.2*  --   0.6 0.7  --   PROT 6.3*  --  6.3* 5.7*  --   ALBUMIN 2.5*   < > 2.4* 2.0* 2.0*   < > = values in this interval not displayed.   No results for input(s): LIPASE, AMYLASE in the last 8760 hours. No results for input(s): AMMONIA in the last 8760 hours. CBC: Recent Labs    12/11/17 12/12/17 1527  12/19/17 0542 12/20/17 0503 12/22/17 0843  WBC 13.3 14.5*   < > 11.4* 10.3 13.5*  NEUTROABS 10 11.0*  --   --   --  10.5*  HGB 7.2* 6.7*   < >  7.2* 7.3* 10.5*  HCT 23* 21.9*   < > 24.0* 23.3* 33.6*  MCV  --  79.6   < > 83.6 83.5 82.0  PLT 498* 459*   < > 394 395 505*   < > = values in this interval not displayed.   Lipid No results for input(s): CHOL, HDL, LDLCALC, TRIG in the last 8760 hours. Cardiac Enzymes: Recent Labs    08/29/17 1756  CKTOTAL 81   BNP: No results for input(s): BNP in the last 8760 hours. CBG: Recent Labs    12/13/17 1153 12/13/17 1609 12/22/17 1153  GLUCAP 103* 94 105*    Procedures and Imaging Studies During Stay: No results found.  Assessment/Plan:   Sepsis, due to unspecified organism ([place])  Wound of right leg, sequela  Peripheral vascular disease of lower extremity with ulceration (HCC)  Acute blood loss as cause of postoperative anemia  Gangrene of foot ([place])  Essential hypertension  Hypothyroidism due to acquired atrophy of thyroid  Acute renal failure superimposed on stage 2 chronic kidney disease, unspecified acute renal failure type ([place])  Gastroesophageal reflux disease without esophagitis  Depression due to physical illness   Patient is being discharged with the following home health services: PT/PT/nursing-wound care  Patient is being discharged with the following durable medical equipment: 12 inch x 18 inch wheelchair with ELR, anti-tippers, break extensions and seat cushion, semi-electric hospital bed  Patient has been advised to f/u with their PCP in 1-2 weeks to bring them up to date on their rehab stay.  Social services  at facility was responsible for arranging this appointment.  Pt was provided with a 30 day supply of prescriptions for medications and refills must be obtained from their PCP.  For controlled substances, a more limited supply may be provided adequate until PCP appointment only.  Medications have been reconciled  Time spent greater than 30 minutes;> 50% of time with patient was spent reviewing records, labs, tests and studies, counseling and developing plan of care  Noah Delaine. Sheppard Coil, MD

## 2018-02-17 ENCOUNTER — Other Ambulatory Visit: Payer: Self-pay | Admitting: Licensed Clinical Social Worker

## 2018-02-17 DIAGNOSIS — Z79899 Other long term (current) drug therapy: Secondary | ICD-10-CM | POA: Diagnosis not present

## 2018-02-17 DIAGNOSIS — N390 Urinary tract infection, site not specified: Secondary | ICD-10-CM | POA: Diagnosis not present

## 2018-02-17 DIAGNOSIS — R319 Hematuria, unspecified: Secondary | ICD-10-CM | POA: Diagnosis not present

## 2018-02-17 NOTE — Patient Outreach (Signed)
[facility]) Care Management  [facility] Social Work  02/17/2018  Shelby Reese 07-11-1935 [id number]   Encounter Medications:  Outpatient Encounter Medications as of 02/17/2018  Medication Sig  . amitriptyline (ELAVIL) 50 MG tablet Take 50 mg by mouth at bedtime.  Marland Kitchen aspirin EC 81 MG tablet Take 81 mg by mouth daily with breakfast.  . benazepril (LOTENSIN) 40 MG tablet Take 40 mg by mouth daily.  . Calcium Carbonate-Vitamin D (CALCIUM 600+D) 600-400 MG-UNIT per tablet Take 1 tablet by mouth daily at 3 pm.   . cetirizine (ZYRTEC) 10 MG tablet Take 10 mg by mouth at bedtime.   Marland Kitchen diltiazem (TIAZAC) 120 MG 24 hr capsule Take 120 mg by mouth daily with breakfast.   . docusate sodium (COLACE) 100 MG capsule Take 1 capsule (100 mg total) by mouth daily.  Marland Kitchen escitalopram (LEXAPRO) 10 MG tablet Take 10 mg by mouth daily.  . famotidine (PEPCID) 40 MG tablet Take 40 mg by mouth daily at 3 pm. 1600  . feeding supplement, ENSURE ENLIVE, (ENSURE ENLIVE) LIQD Take 237 mLs by mouth 3 (three) times daily with meals.  . fentaNYL (DURAGESIC - DOSED MCG/HR) 25 MCG/HR patch Place 25 mcg onto the skin every 3 (three) days.  . ferrous sulfate 325 (65 FE) MG tablet Take 325 mg by mouth 2 (two) times daily with a meal.  . furosemide (LASIX) 20 MG tablet Take 20 mg by mouth daily.   . hydrALAZINE (APRESOLINE) 50 MG tablet Take 50 mg by mouth 4 (four) times daily.  . methocarbamol (ROBAXIN) 500 MG tablet Take 500 mg by mouth every 6 (six) hours as needed for muscle spasms.  . Multiple Vitamins-Minerals (MULTIVITAMIN WITH MINERALS) tablet Take 1 tablet by mouth daily.  Marland Kitchen oxyCODONE (OXY IR/ROXICODONE) 5 MG immediate release tablet Take 5 mg by mouth every 4 (four) hours. Every 30 mins before therapy   . pantoprazole (PROTONIX) 40 MG tablet Take 1 tablet (40 mg total) by mouth daily.  . polyethylene glycol (MIRALAX / GLYCOLAX) packet Take 17 g by mouth 2 (two) times daily.  Marland Kitchen senna-docusate (SENNA-PLUS)  8.6-50 MG tablet Take 2 tablets by mouth 2 (two) times daily.  Marland Kitchen SYNTHROID 175 MCG tablet Take 175 mcg by mouth daily before breakfast.   . vitamin C (ASCORBIC ACID) 500 MG tablet Take 500 mg by mouth daily.   No facility-administered encounter medications on file as of 02/17/2018.     Functional Status:  In your present state of health, do you have any difficulty performing the following activities: 12/29/2017 12/12/2017  Hearing? N -  Vision? N -  Difficulty concentrating or making decisions? N -  Walking or climbing stairs? Y -  Dressing or bathing? Y -  Doing errands, shopping? N N  Some recent data might be hidden    Fall/Depression Screening:  PHQ 2/9 Scores 02/03/2018 12/29/2017  PHQ - 2 Score 2 0  PHQ- 9 Score 4 -    [facility] CSW arrived at [facility] and successfully completed visit with patient. Patient reports that she is very excited to be discharging from [facility] tomorrow and going to [facility] for 30 days for respite care. Per SNF social worker, [facility] services were ordered through [facility] as well as personal care services through [facility].  SNF social worker also informed [place] that she ordered a wheelchair and a hospital bed and both will be delivered to [facility] tomorrow. After 30 days, patient will  discharge from [facility] back home with spouse and will prepare to sell the house and then move together into a ALF. [facility] CSW will complete referral to [facility] to follow up with patient to make sure that transition went smooth from SNF to ALF and that patient is getting the correct medications and care. [facility] CSW will sign off at this time and assign case to [place].  [name], BSW, MSW, [email or web address] Phone: [phone] Fax: [phone]

## 2018-02-18 DIAGNOSIS — F0631 Mood disorder due to known physiological condition with depressive features: Secondary | ICD-10-CM | POA: Insufficient documentation

## 2018-02-18 DIAGNOSIS — I96 Gangrene, not elsewhere classified: Secondary | ICD-10-CM | POA: Insufficient documentation

## 2018-02-19 ENCOUNTER — Encounter: Payer: Self-pay | Admitting: *Deleted

## 2018-02-19 DIAGNOSIS — D631 Anemia in chronic kidney disease: Secondary | ICD-10-CM | POA: Diagnosis not present

## 2018-02-19 DIAGNOSIS — I739 Peripheral vascular disease, unspecified: Secondary | ICD-10-CM | POA: Diagnosis not present

## 2018-02-19 DIAGNOSIS — I129 Hypertensive chronic kidney disease with stage 1 through stage 4 chronic kidney disease, or unspecified chronic kidney disease: Secondary | ICD-10-CM | POA: Diagnosis not present

## 2018-02-19 DIAGNOSIS — N183 Chronic kidney disease, stage 3 (moderate): Secondary | ICD-10-CM | POA: Diagnosis not present

## 2018-02-19 DIAGNOSIS — L97521 Non-pressure chronic ulcer of other part of left foot limited to breakdown of skin: Secondary | ICD-10-CM | POA: Diagnosis not present

## 2018-02-19 DIAGNOSIS — N390 Urinary tract infection, site not specified: Secondary | ICD-10-CM | POA: Diagnosis not present

## 2018-02-19 DIAGNOSIS — L97221 Non-pressure chronic ulcer of left calf limited to breakdown of skin: Secondary | ICD-10-CM | POA: Diagnosis not present

## 2018-02-19 DIAGNOSIS — L89620 Pressure ulcer of left heel, unstageable: Secondary | ICD-10-CM | POA: Diagnosis not present

## 2018-02-19 DIAGNOSIS — Z4781 Encounter for orthopedic aftercare following surgical amputation: Secondary | ICD-10-CM | POA: Diagnosis not present

## 2018-02-20 ENCOUNTER — Other Ambulatory Visit: Payer: Self-pay | Admitting: *Deleted

## 2018-02-20 DIAGNOSIS — L97521 Non-pressure chronic ulcer of other part of left foot limited to breakdown of skin: Secondary | ICD-10-CM | POA: Diagnosis not present

## 2018-02-20 DIAGNOSIS — L97221 Non-pressure chronic ulcer of left calf limited to breakdown of skin: Secondary | ICD-10-CM | POA: Diagnosis not present

## 2018-02-20 DIAGNOSIS — L89620 Pressure ulcer of left heel, unstageable: Secondary | ICD-10-CM | POA: Diagnosis not present

## 2018-02-20 DIAGNOSIS — N390 Urinary tract infection, site not specified: Secondary | ICD-10-CM | POA: Diagnosis not present

## 2018-02-20 DIAGNOSIS — I739 Peripheral vascular disease, unspecified: Secondary | ICD-10-CM | POA: Diagnosis not present

## 2018-02-20 DIAGNOSIS — Z4781 Encounter for orthopedic aftercare following surgical amputation: Secondary | ICD-10-CM | POA: Diagnosis not present

## 2018-02-20 NOTE — Patient Outreach (Signed)
[facility] Care Management  02/20/2018  Shelby Reese Oct 24, 1935 [id number]   Referral received from LCSW as member was recently discharged from SNF for rehab, now at [facility] for respite care prior to being discharge back to home.  She had right AKA done during last hospitalization, in need of left AKA but not agreeable at this time.  Per chart, she also has history of hypertension, peripheral vascular disease, GERD, hypothyroidism, chronic kidney disease, and anemia.  Call placed to member's listed number, which is her home number.  Message left for husband (who is also active with [facility] ), no answer.  HIPAA compliant voice message left.  Call received back from husband.  He agrees that member would benefit from services, agrees to visit with member on Monday, but request this care manager to contact member directly to see if she is interested.  Call placed to member at [phone], number provided by husband.  Identity verified.  This care manager introduces self and purpose of call.  [facility] care management services explained.  She denies the need for additional services, state she "manages" her own care with the assistance of some resources, home health being one.  She feel [facility] will be a duplication of services and prefer not to be involved.  Difference between [facility] and home health discussed, she again declines offer.  Will not open at this time but will contact C. Spinks, NP, to have her reach out to member for additional support next week.  Valente David, [place], MSN [place] [phone]

## 2018-02-20 NOTE — Telephone Encounter (Signed)
This encounter was created in error - please disregard.

## 2018-02-21 DIAGNOSIS — L97221 Non-pressure chronic ulcer of left calf limited to breakdown of skin: Secondary | ICD-10-CM | POA: Diagnosis not present

## 2018-02-21 DIAGNOSIS — L97521 Non-pressure chronic ulcer of other part of left foot limited to breakdown of skin: Secondary | ICD-10-CM | POA: Diagnosis not present

## 2018-02-21 DIAGNOSIS — Z4781 Encounter for orthopedic aftercare following surgical amputation: Secondary | ICD-10-CM | POA: Diagnosis not present

## 2018-02-21 DIAGNOSIS — N390 Urinary tract infection, site not specified: Secondary | ICD-10-CM | POA: Diagnosis not present

## 2018-02-21 DIAGNOSIS — L89620 Pressure ulcer of left heel, unstageable: Secondary | ICD-10-CM | POA: Diagnosis not present

## 2018-02-21 DIAGNOSIS — I739 Peripheral vascular disease, unspecified: Secondary | ICD-10-CM | POA: Diagnosis not present

## 2018-02-23 ENCOUNTER — Inpatient Hospital Stay (HOSPITAL_COMMUNITY)
Admission: EM | Admit: 2018-02-23 | Discharge: 2018-03-18 | DRG: 270 | Disposition: A | Discharge status: Home Health | Payer: Medicare Other | Attending: Internal Medicine | Admitting: Internal Medicine

## 2018-02-23 ENCOUNTER — Emergency Department (HOSPITAL_COMMUNITY): Payer: Medicare Other

## 2018-02-23 DIAGNOSIS — D649 Anemia, unspecified: Secondary | ICD-10-CM | POA: Diagnosis present

## 2018-02-23 DIAGNOSIS — K5900 Constipation, unspecified: Secondary | ICD-10-CM | POA: Diagnosis not present

## 2018-02-23 DIAGNOSIS — Z9841 Cataract extraction status, right eye: Secondary | ICD-10-CM

## 2018-02-23 DIAGNOSIS — I70245 Atherosclerosis of native arteries of left leg with ulceration of other part of foot: Principal | ICD-10-CM | POA: Diagnosis present

## 2018-02-23 DIAGNOSIS — I739 Peripheral vascular disease, unspecified: Secondary | ICD-10-CM | POA: Diagnosis present

## 2018-02-23 DIAGNOSIS — B9689 Other specified bacterial agents as the cause of diseases classified elsewhere: Secondary | ICD-10-CM | POA: Diagnosis present

## 2018-02-23 DIAGNOSIS — M810 Age-related osteoporosis without current pathological fracture: Secondary | ICD-10-CM | POA: Diagnosis present

## 2018-02-23 DIAGNOSIS — L03115 Cellulitis of right lower limb: Secondary | ICD-10-CM

## 2018-02-23 DIAGNOSIS — Z91018 Allergy to other foods: Secondary | ICD-10-CM

## 2018-02-23 DIAGNOSIS — N390 Urinary tract infection, site not specified: Secondary | ICD-10-CM | POA: Diagnosis present

## 2018-02-23 DIAGNOSIS — N183 Chronic kidney disease, stage 3 unspecified: Secondary | ICD-10-CM | POA: Diagnosis present

## 2018-02-23 DIAGNOSIS — D62 Acute posthemorrhagic anemia: Secondary | ICD-10-CM | POA: Diagnosis present

## 2018-02-23 DIAGNOSIS — Z8042 Family history of malignant neoplasm of prostate: Secondary | ICD-10-CM

## 2018-02-23 DIAGNOSIS — L03116 Cellulitis of left lower limb: Secondary | ICD-10-CM | POA: Diagnosis present

## 2018-02-23 DIAGNOSIS — I70201 Unspecified atherosclerosis of native arteries of extremities, right leg: Secondary | ICD-10-CM | POA: Diagnosis not present

## 2018-02-23 DIAGNOSIS — K920 Hematemesis: Secondary | ICD-10-CM | POA: Diagnosis not present

## 2018-02-23 DIAGNOSIS — K921 Melena: Secondary | ICD-10-CM | POA: Diagnosis not present

## 2018-02-23 DIAGNOSIS — Z961 Presence of intraocular lens: Secondary | ICD-10-CM | POA: Diagnosis present

## 2018-02-23 DIAGNOSIS — I129 Hypertensive chronic kidney disease with stage 1 through stage 4 chronic kidney disease, or unspecified chronic kidney disease: Secondary | ICD-10-CM | POA: Diagnosis present

## 2018-02-23 DIAGNOSIS — Z880 Allergy status to penicillin: Secondary | ICD-10-CM

## 2018-02-23 DIAGNOSIS — L89620 Pressure ulcer of left heel, unstageable: Secondary | ICD-10-CM | POA: Diagnosis not present

## 2018-02-23 DIAGNOSIS — Z793 Long term (current) use of hormonal contraceptives: Secondary | ICD-10-CM

## 2018-02-23 DIAGNOSIS — I10 Essential (primary) hypertension: Secondary | ICD-10-CM | POA: Diagnosis not present

## 2018-02-23 DIAGNOSIS — R031 Nonspecific low blood-pressure reading: Secondary | ICD-10-CM | POA: Diagnosis not present

## 2018-02-23 DIAGNOSIS — K317 Polyp of stomach and duodenum: Secondary | ICD-10-CM | POA: Diagnosis present

## 2018-02-23 DIAGNOSIS — E861 Hypovolemia: Secondary | ICD-10-CM | POA: Diagnosis not present

## 2018-02-23 DIAGNOSIS — G8929 Other chronic pain: Secondary | ICD-10-CM | POA: Diagnosis present

## 2018-02-23 DIAGNOSIS — Z89611 Acquired absence of right leg above knee: Secondary | ICD-10-CM

## 2018-02-23 DIAGNOSIS — L97529 Non-pressure chronic ulcer of other part of left foot with unspecified severity: Secondary | ICD-10-CM | POA: Diagnosis not present

## 2018-02-23 DIAGNOSIS — I70244 Atherosclerosis of native arteries of left leg with ulceration of heel and midfoot: Secondary | ICD-10-CM | POA: Diagnosis not present

## 2018-02-23 DIAGNOSIS — Z6833 Body mass index (BMI) 33.0-33.9, adult: Secondary | ICD-10-CM

## 2018-02-23 DIAGNOSIS — R578 Other shock: Secondary | ICD-10-CM | POA: Diagnosis not present

## 2018-02-23 DIAGNOSIS — Z7401 Bed confinement status: Secondary | ICD-10-CM | POA: Diagnosis not present

## 2018-02-23 DIAGNOSIS — R195 Other fecal abnormalities: Secondary | ICD-10-CM

## 2018-02-23 DIAGNOSIS — L97221 Non-pressure chronic ulcer of left calf limited to breakdown of skin: Secondary | ICD-10-CM | POA: Diagnosis not present

## 2018-02-23 DIAGNOSIS — K644 Residual hemorrhoidal skin tags: Secondary | ICD-10-CM | POA: Diagnosis present

## 2018-02-23 DIAGNOSIS — R339 Retention of urine, unspecified: Secondary | ICD-10-CM | POA: Diagnosis not present

## 2018-02-23 DIAGNOSIS — J811 Chronic pulmonary edema: Secondary | ICD-10-CM | POA: Diagnosis not present

## 2018-02-23 DIAGNOSIS — D500 Iron deficiency anemia secondary to blood loss (chronic): Secondary | ICD-10-CM | POA: Diagnosis not present

## 2018-02-23 DIAGNOSIS — D509 Iron deficiency anemia, unspecified: Secondary | ICD-10-CM | POA: Diagnosis present

## 2018-02-23 DIAGNOSIS — E8770 Fluid overload, unspecified: Secondary | ICD-10-CM

## 2018-02-23 DIAGNOSIS — Z79899 Other long term (current) drug therapy: Secondary | ICD-10-CM

## 2018-02-23 DIAGNOSIS — E039 Hypothyroidism, unspecified: Secondary | ICD-10-CM | POA: Diagnosis present

## 2018-02-23 DIAGNOSIS — Z882 Allergy status to sulfonamides status: Secondary | ICD-10-CM

## 2018-02-23 DIAGNOSIS — Z881 Allergy status to other antibiotic agents status: Secondary | ICD-10-CM

## 2018-02-23 DIAGNOSIS — K219 Gastro-esophageal reflux disease without esophagitis: Secondary | ICD-10-CM | POA: Diagnosis present

## 2018-02-23 DIAGNOSIS — N179 Acute kidney failure, unspecified: Secondary | ICD-10-CM | POA: Diagnosis not present

## 2018-02-23 DIAGNOSIS — M1990 Unspecified osteoarthritis, unspecified site: Secondary | ICD-10-CM | POA: Diagnosis present

## 2018-02-23 DIAGNOSIS — L97521 Non-pressure chronic ulcer of other part of left foot limited to breakdown of skin: Secondary | ICD-10-CM | POA: Diagnosis not present

## 2018-02-23 DIAGNOSIS — Z452 Encounter for adjustment and management of vascular access device: Secondary | ICD-10-CM

## 2018-02-23 DIAGNOSIS — L97909 Non-pressure chronic ulcer of unspecified part of unspecified lower leg with unspecified severity: Secondary | ICD-10-CM

## 2018-02-23 DIAGNOSIS — Z91040 Latex allergy status: Secondary | ICD-10-CM

## 2018-02-23 DIAGNOSIS — M797 Fibromyalgia: Secondary | ICD-10-CM | POA: Diagnosis present

## 2018-02-23 DIAGNOSIS — F329 Major depressive disorder, single episode, unspecified: Secondary | ICD-10-CM | POA: Diagnosis present

## 2018-02-23 DIAGNOSIS — Z79890 Hormone replacement therapy (postmenopausal): Secondary | ICD-10-CM

## 2018-02-23 DIAGNOSIS — Z7982 Long term (current) use of aspirin: Secondary | ICD-10-CM

## 2018-02-23 DIAGNOSIS — R739 Hyperglycemia, unspecified: Secondary | ICD-10-CM | POA: Diagnosis not present

## 2018-02-23 DIAGNOSIS — I872 Venous insufficiency (chronic) (peripheral): Secondary | ICD-10-CM | POA: Diagnosis present

## 2018-02-23 DIAGNOSIS — L97523 Non-pressure chronic ulcer of other part of left foot with necrosis of muscle: Secondary | ICD-10-CM | POA: Diagnosis not present

## 2018-02-23 DIAGNOSIS — K648 Other hemorrhoids: Secondary | ICD-10-CM | POA: Diagnosis present

## 2018-02-23 DIAGNOSIS — K6389 Other specified diseases of intestine: Secondary | ICD-10-CM | POA: Diagnosis present

## 2018-02-23 DIAGNOSIS — L97509 Non-pressure chronic ulcer of other part of unspecified foot with unspecified severity: Secondary | ICD-10-CM | POA: Diagnosis present

## 2018-02-23 DIAGNOSIS — I96 Gangrene, not elsewhere classified: Secondary | ICD-10-CM | POA: Diagnosis not present

## 2018-02-23 DIAGNOSIS — Z4781 Encounter for orthopedic aftercare following surgical amputation: Secondary | ICD-10-CM | POA: Diagnosis not present

## 2018-02-23 DIAGNOSIS — R0602 Shortness of breath: Secondary | ICD-10-CM

## 2018-02-23 DIAGNOSIS — R079 Chest pain, unspecified: Secondary | ICD-10-CM | POA: Diagnosis not present

## 2018-02-23 DIAGNOSIS — M7989 Other specified soft tissue disorders: Secondary | ICD-10-CM | POA: Diagnosis not present

## 2018-02-23 DIAGNOSIS — Z87891 Personal history of nicotine dependence: Secondary | ICD-10-CM

## 2018-02-23 DIAGNOSIS — Z9842 Cataract extraction status, left eye: Secondary | ICD-10-CM

## 2018-02-23 DIAGNOSIS — E669 Obesity, unspecified: Secondary | ICD-10-CM | POA: Diagnosis present

## 2018-02-23 DIAGNOSIS — Z888 Allergy status to other drugs, medicaments and biological substances status: Secondary | ICD-10-CM

## 2018-02-23 DIAGNOSIS — Z79891 Long term (current) use of opiate analgesic: Secondary | ICD-10-CM

## 2018-02-23 DIAGNOSIS — M79672 Pain in left foot: Secondary | ICD-10-CM | POA: Diagnosis not present

## 2018-02-23 DIAGNOSIS — M2550 Pain in unspecified joint: Secondary | ICD-10-CM | POA: Diagnosis not present

## 2018-02-23 DIAGNOSIS — I7090 Unspecified atherosclerosis: Secondary | ICD-10-CM | POA: Diagnosis present

## 2018-02-23 LAB — URINALYSIS, ROUTINE W REFLEX MICROSCOPIC
BILIRUBIN URINE: NEGATIVE
Glucose, UA: NEGATIVE mg/dL
Hgb urine dipstick: NEGATIVE
Ketones, ur: NEGATIVE mg/dL
Nitrite: NEGATIVE
PH: 8 (ref 5.0–8.0)
PROTEIN: NEGATIVE mg/dL
Specific Gravity, Urine: 1.01 (ref 1.005–1.030)

## 2018-02-23 LAB — CBC WITH DIFFERENTIAL/PLATELET
BASOS ABS: 0 10*3/uL (ref 0.0–0.1)
BASOS PCT: 0 %
EOS ABS: 0.5 10*3/uL (ref 0.0–0.7)
EOS PCT: 5 %
HCT: 22.8 % — ABNORMAL LOW (ref 36.0–46.0)
Hemoglobin: 6.9 g/dL — CL (ref 12.0–15.0)
LYMPHS PCT: 14 %
Lymphs Abs: 1.3 10*3/uL (ref 0.7–4.0)
MCH: 26.3 pg (ref 26.0–34.0)
MCHC: 30.3 g/dL (ref 30.0–36.0)
MCV: 87 fL (ref 78.0–100.0)
Monocytes Absolute: 0.8 10*3/uL (ref 0.1–1.0)
Monocytes Relative: 9 %
Neutro Abs: 7.2 10*3/uL (ref 1.7–7.7)
Neutrophils Relative %: 72 %
PLATELETS: 435 10*3/uL — AB (ref 150–400)
RBC: 2.62 MIL/uL — AB (ref 3.87–5.11)
RDW: 16.9 % — AB (ref 11.5–15.5)
WBC: 9.9 10*3/uL (ref 4.0–10.5)

## 2018-02-23 LAB — COMPREHENSIVE METABOLIC PANEL
ALBUMIN: 3.2 g/dL — AB (ref 3.5–5.0)
ALT: 14 U/L (ref 14–54)
AST: 21 U/L (ref 15–41)
Alkaline Phosphatase: 56 U/L (ref 38–126)
Anion gap: 10 (ref 5–15)
BUN: 48 mg/dL — AB (ref 6–20)
CHLORIDE: 99 mmol/L — AB (ref 101–111)
CO2: 24 mmol/L (ref 22–32)
CREATININE: 2.3 mg/dL — AB (ref 0.44–1.00)
Calcium: 9.7 mg/dL (ref 8.9–10.3)
GFR calc Af Amer: 22 mL/min — ABNORMAL LOW (ref >60)
GFR calc non Af Amer: 19 mL/min — ABNORMAL LOW (ref >60)
Glucose, Bld: 109 mg/dL — ABNORMAL HIGH (ref 65–99)
Potassium: 4.7 mmol/L (ref 3.5–5.1)
SODIUM: 133 mmol/L — AB (ref 135–145)
Total Bilirubin: 0.4 mg/dL (ref 0.3–1.2)
Total Protein: 7.3 g/dL (ref 6.5–8.1)

## 2018-02-23 LAB — FOLATE: Folate: 12.6 ng/mL (ref >5.9)

## 2018-02-23 LAB — FERRITIN: FERRITIN: 23 ng/mL (ref 11–307)

## 2018-02-23 LAB — RETICULOCYTES
RBC.: 2.59 MIL/uL — ABNORMAL LOW (ref 3.87–5.11)
RETIC COUNT ABSOLUTE: 54.4 10*3/uL (ref 19.0–186.0)
RETIC CT PCT: 2.1 % (ref 0.4–3.1)

## 2018-02-23 LAB — IRON AND TIBC
Iron: 12 ug/dL — ABNORMAL LOW (ref 28–170)
Saturation Ratios: 5 % — ABNORMAL LOW (ref 10.4–31.8)
TIBC: 259 ug/dL (ref 250–450)
UIBC: 247 ug/dL

## 2018-02-23 LAB — VITAMIN B12: Vitamin B-12: 277 pg/mL (ref 180–914)

## 2018-02-23 LAB — I-STAT CG4 LACTIC ACID, ED: LACTIC ACID, VENOUS: 0.63 mmol/L (ref 0.5–1.9)

## 2018-02-23 LAB — POC OCCULT BLOOD, ED: Fecal Occult Bld: POSITIVE — AB

## 2018-02-23 LAB — PREPARE RBC (CROSSMATCH)

## 2018-02-23 MED ORDER — SODIUM CHLORIDE 0.9 % IV SOLN
INTRAVENOUS | Status: DC
  Administered 2018-02-24 – 2018-02-28 (×6): via INTRAVENOUS
  Administered 2018-02-28: 125 mL/h via INTRAVENOUS
  Administered 2018-02-28 – 2018-03-01 (×2): via INTRAVENOUS

## 2018-02-23 MED ORDER — FAMOTIDINE 20 MG PO TABS: 40.0000 mg | ORAL_TABLET | Freq: Every day | ORAL | Status: DC

## 2018-02-23 MED ORDER — HYDRALAZINE HCL 50 MG PO TABS
50.0000 mg | ORAL_TABLET | Freq: Four times a day (QID) | ORAL | Status: DC
  Administered 2018-02-24 – 2018-03-08 (×45): 50 mg via ORAL
  Filled 2018-02-23 (×48): qty 1

## 2018-02-23 MED ORDER — VITAMIN C 500 MG PO TABS
500.0000 mg | ORAL_TABLET | Freq: Every day | ORAL | Status: DC
  Administered 2018-02-24 – 2018-03-18 (×20): 500 mg via ORAL
  Filled 2018-02-23 (×22): qty 1

## 2018-02-23 MED ORDER — OXYCODONE HCL 5 MG PO TABS
5.0000 mg | ORAL_TABLET | ORAL | Status: DC
  Administered 2018-02-23 – 2018-03-06 (×51): 5 mg via ORAL
  Administered 2018-03-06: 10 mg via ORAL
  Administered 2018-03-06 – 2018-03-10 (×22): 5 mg via ORAL
  Filled 2018-02-23 (×78): qty 1

## 2018-02-23 MED ORDER — FERROUS SULFATE 325 (65 FE) MG PO TABS
325.0000 mg | ORAL_TABLET | Freq: Two times a day (BID) | ORAL | Status: DC
  Administered 2018-02-24 – 2018-03-10 (×26): 325 mg via ORAL
  Filled 2018-02-23 (×27): qty 1

## 2018-02-23 MED ORDER — DIPHENHYDRAMINE HCL 25 MG PO CAPS
25.0000 mg | ORAL_CAPSULE | Freq: Once | ORAL | Status: AC
  Administered 2018-02-23: 25 mg via ORAL
  Filled 2018-02-23: qty 1

## 2018-02-23 MED ORDER — ONDANSETRON HCL 4 MG/2ML IJ SOLN: 4.0000 mg | Freq: Four times a day (QID) | INTRAMUSCULAR | Status: DC | PRN

## 2018-02-23 MED ORDER — METHOCARBAMOL 500 MG PO TABS
500.0000 mg | ORAL_TABLET | Freq: Four times a day (QID) | ORAL | Status: DC | PRN
  Administered 2018-03-02 – 2018-03-08 (×3): 500 mg via ORAL
  Filled 2018-02-23 (×4): qty 1

## 2018-02-23 MED ORDER — CLINDAMYCIN PHOSPHATE 600 MG/50ML IV SOLN
600.0000 mg | Freq: Three times a day (TID) | INTRAVENOUS | Status: DC
  Administered 2018-02-24 – 2018-03-04 (×24): 600 mg via INTRAVENOUS
  Filled 2018-02-23 (×30): qty 50

## 2018-02-23 MED ORDER — ACETAMINOPHEN 325 MG PO TABS
650.0000 mg | ORAL_TABLET | Freq: Once | ORAL | Status: AC
Start: 2018-02-23 — End: 2018-02-23
  Administered 2018-02-23: 650 mg via ORAL
  Filled 2018-02-23: qty 2

## 2018-02-23 MED ORDER — SODIUM CHLORIDE 0.9 % IV SOLN
80.0000 mg | Freq: Once | INTRAVENOUS | Status: AC
  Administered 2018-02-23: 18:00:00 80 mg via INTRAVENOUS
  Filled 2018-02-23: qty 80

## 2018-02-23 MED ORDER — CLINDAMYCIN PHOSPHATE 600 MG/50ML IV SOLN
600.0000 mg | Freq: Once | INTRAVENOUS | Status: AC
  Administered 2018-02-23: 600 mg via INTRAVENOUS
  Filled 2018-02-23: qty 50

## 2018-02-23 MED ORDER — OXYCODONE-ACETAMINOPHEN 5-325 MG PO TABS
1.0000 | ORAL_TABLET | Freq: Four times a day (QID) | ORAL | Status: DC | PRN
  Administered 2018-02-26: 1 via ORAL
  Filled 2018-02-23: qty 1

## 2018-02-23 MED ORDER — LEVOTHYROXINE SODIUM 75 MCG PO TABS
175.0000 ug | ORAL_TABLET | Freq: Every day | ORAL | Status: DC
Start: 2018-02-24 — End: 2018-03-18
  Administered 2018-02-24 – 2018-03-18 (×23): 175 ug via ORAL
  Filled 2018-02-23 (×11): qty 1
  Filled 2018-02-23: qty 3
  Filled 2018-02-23 (×11): qty 1

## 2018-02-23 MED ORDER — HYDROMORPHONE HCL 1 MG/ML IJ SOLN
0.5000 mg | Freq: Once | INTRAMUSCULAR | Status: AC
  Administered 2018-02-23: 0.5 mg via INTRAVENOUS
  Filled 2018-02-23: qty 1

## 2018-02-23 MED ORDER — ESCITALOPRAM OXALATE 10 MG PO TABS
10.0000 mg | ORAL_TABLET | Freq: Every day | ORAL | Status: DC
  Administered 2018-02-24 – 2018-03-18 (×22): 10 mg via ORAL
  Filled 2018-02-23 (×23): qty 1

## 2018-02-23 MED ORDER — SODIUM CHLORIDE 0.9 % IV SOLN: 10.0000 mL/h | Freq: Once | INTRAVENOUS | Status: DC

## 2018-02-23 MED ORDER — PANTOPRAZOLE SODIUM 40 MG PO TBEC
40.0000 mg | DELAYED_RELEASE_TABLET | Freq: Every day | ORAL | Status: DC
  Administered 2018-02-24 – 2018-03-08 (×12): 40 mg via ORAL
  Filled 2018-02-23 (×13): qty 1

## 2018-02-23 MED ORDER — FUROSEMIDE 10 MG/ML IJ SOLN
20.0000 mg | Freq: Once | INTRAMUSCULAR | Status: AC
  Administered 2018-02-23: 20 mg via INTRAVENOUS
  Filled 2018-02-23: qty 2

## 2018-02-23 MED ORDER — FENTANYL 25 MCG/HR TD PT72
25.0000 ug | MEDICATED_PATCH | TRANSDERMAL | Status: DC
  Administered 2018-02-23 – 2018-03-16 (×8): 25 ug via TRANSDERMAL
  Filled 2018-02-23: qty 1
  Filled 2018-02-23: qty 2
  Filled 2018-02-23 (×7): qty 1

## 2018-02-23 MED ORDER — BENAZEPRIL HCL 20 MG PO TABS
40.0000 mg | ORAL_TABLET | Freq: Every day | ORAL | Status: DC
  Administered 2018-02-24: 40 mg via ORAL
  Filled 2018-02-23: qty 2

## 2018-02-23 MED ORDER — CEFAZOLIN SODIUM-DEXTROSE 1-4 GM/50ML-% IV SOLN
1.0000 g | Freq: Two times a day (BID) | INTRAVENOUS | Status: DC
  Administered 2018-02-24: 1 g via INTRAVENOUS
  Filled 2018-02-23: qty 50

## 2018-02-23 MED ORDER — CEFAZOLIN SODIUM-DEXTROSE 1-4 GM/50ML-% IV SOLN
1.0000 g | Freq: Once | INTRAVENOUS | Status: AC
  Administered 2018-02-23: 1 g via INTRAVENOUS
  Filled 2018-02-23: qty 50

## 2018-02-23 MED ORDER — CALCIUM CARBONATE-VITAMIN D 500-200 MG-UNIT PO TABS
1.0000 | ORAL_TABLET | Freq: Every day | ORAL | Status: DC
  Administered 2018-02-24 – 2018-03-18 (×20): 1 via ORAL
  Filled 2018-02-23 (×22): qty 1

## 2018-02-23 MED ORDER — LORATADINE 10 MG PO TABS
10.0000 mg | ORAL_TABLET | Freq: Every day | ORAL | Status: DC
  Administered 2018-02-24 – 2018-03-18 (×20): 10 mg via ORAL
  Filled 2018-02-23 (×22): qty 1

## 2018-02-23 MED ORDER — ADULT MULTIVITAMIN W/MINERALS CH
1.0000 | ORAL_TABLET | Freq: Every day | ORAL | Status: DC
  Filled 2018-02-23: qty 1

## 2018-02-23 MED ORDER — SODIUM CHLORIDE 0.9 % IV SOLN: Freq: Once | INTRAVENOUS | Status: DC

## 2018-02-23 MED ORDER — HEPARIN SODIUM (PORCINE) 5000 UNIT/ML IJ SOLN
5000.0000 [IU] | Freq: Three times a day (TID) | INTRAMUSCULAR | Status: DC
  Administered 2018-02-23 – 2018-03-10 (×40): 5000 [IU] via SUBCUTANEOUS
  Filled 2018-02-23 (×42): qty 1

## 2018-02-23 MED ORDER — ONDANSETRON HCL 4 MG/2ML IJ SOLN
4.0000 mg | Freq: Four times a day (QID) | INTRAMUSCULAR | Status: DC
  Administered 2018-02-24 (×2): 4 mg via INTRAVENOUS
  Filled 2018-02-23 (×2): qty 2

## 2018-02-23 MED ORDER — SENNOSIDES-DOCUSATE SODIUM 8.6-50 MG PO TABS
2.0000 | ORAL_TABLET | Freq: Two times a day (BID) | ORAL | Status: DC
  Administered 2018-02-24 – 2018-03-09 (×20): 2 via ORAL
  Filled 2018-02-23 (×22): qty 2

## 2018-02-23 MED ORDER — MORPHINE SULFATE (PF) 4 MG/ML IV SOLN
4.0000 mg | Freq: Once | INTRAVENOUS | Status: AC
  Administered 2018-02-23: 4 mg via INTRAVENOUS
  Filled 2018-02-23: qty 1

## 2018-02-23 MED ORDER — DILTIAZEM HCL ER COATED BEADS 120 MG PO CP24
120.0000 mg | ORAL_CAPSULE | Freq: Every day | ORAL | Status: DC
  Administered 2018-02-24 – 2018-03-09 (×14): 120 mg via ORAL
  Filled 2018-02-23 (×16): qty 1

## 2018-02-23 MED ORDER — POLYETHYLENE GLYCOL 3350 17 G PO PACK
17.0000 g | PACK | Freq: Two times a day (BID) | ORAL | Status: DC
  Administered 2018-02-24 – 2018-03-02 (×11): 17 g via ORAL
  Filled 2018-02-23 (×17): qty 1

## 2018-02-23 MED ORDER — DOCUSATE SODIUM 100 MG PO CAPS
100.0000 mg | ORAL_CAPSULE | Freq: Every day | ORAL | Status: DC
  Administered 2018-02-24 – 2018-03-07 (×9): 100 mg via ORAL
  Filled 2018-02-23 (×11): qty 1

## 2018-02-23 MED ORDER — ASPIRIN EC 81 MG PO TBEC
81.0000 mg | DELAYED_RELEASE_TABLET | Freq: Every day | ORAL | Status: DC
  Administered 2018-02-24 – 2018-03-03 (×8): 81 mg via ORAL
  Filled 2018-02-23 (×8): qty 1

## 2018-02-23 MED ORDER — AMITRIPTYLINE HCL 50 MG PO TABS
50.0000 mg | ORAL_TABLET | Freq: Every day | ORAL | Status: DC
  Administered 2018-02-23 – 2018-03-17 (×22): 50 mg via ORAL
  Filled 2018-02-23 (×5): qty 1
  Filled 2018-02-23: qty 2
  Filled 2018-02-23 (×2): qty 1
  Filled 2018-02-23: qty 2
  Filled 2018-02-23: qty 1
  Filled 2018-02-23 (×2): qty 2
  Filled 2018-02-23 (×3): qty 1
  Filled 2018-02-23: qty 2
  Filled 2018-02-23 (×2): qty 1
  Filled 2018-02-23: qty 2
  Filled 2018-02-23 (×3): qty 1
  Filled 2018-02-23: qty 2
  Filled 2018-02-23: qty 1
  Filled 2018-02-23: qty 2

## 2018-02-23 MED ORDER — ENSURE ENLIVE PO LIQD
237.0000 mL | Freq: Three times a day (TID) | ORAL | Status: DC
  Administered 2018-02-24 – 2018-03-15 (×24): 237 mL via ORAL

## 2018-02-23 MED ORDER — ONDANSETRON HCL 4 MG/2ML IJ SOLN
4.0000 mg | Freq: Once | INTRAMUSCULAR | Status: AC
  Administered 2018-02-23: 4 mg via INTRAVENOUS
  Filled 2018-02-23: qty 2

## 2018-02-23 MED ORDER — CALCIUM CARBONATE-VITAMIN D 600-400 MG-UNIT PO TABS: 1.0000 | ORAL_TABLET | Freq: Every day | ORAL | Status: DC

## 2018-02-23 MED ORDER — ONDANSETRON HCL 4 MG PO TABS
4.0000 mg | ORAL_TABLET | Freq: Four times a day (QID) | ORAL | Status: DC | PRN
  Filled 2018-02-23: qty 1

## 2018-02-23 MED ORDER — ZOLPIDEM TARTRATE 5 MG PO TABS
5.0000 mg | ORAL_TABLET | Freq: Every evening | ORAL | Status: DC | PRN
  Administered 2018-02-28: 5 mg via ORAL
  Filled 2018-02-23 (×2): qty 1

## 2018-02-23 MED ORDER — FUROSEMIDE 20 MG PO TABS
20.0000 mg | ORAL_TABLET | Freq: Every day | ORAL | Status: DC
  Administered 2018-02-24: 20 mg via ORAL
  Filled 2018-02-23: qty 1

## 2018-02-23 NOTE — ED Notes (Signed)
Bed: TF57 Expected date:  Expected time:  Means of arrival:  Comments: EMS-foot infection

## 2018-02-23 NOTE — H&P (Addendum)
[facility]                                                                                    Patient Demographics  Shelby Reese, is a 83 y.o. female  CSN: [id number]  MRN: [id number]  DOB - [birth year]  Admit Date - 02/23/2018  Outpatient Primary MD for the patient is [name], MD   With History of -  Past Medical History:  Diagnosis Date  . Allergy   . Anemia   . CKD (chronic kidney disease), stage II   . Fibromyalgia   . GERD (gastroesophageal reflux disease)   . Hiatal hernia   . Hypertension   . Hypothyroid   . Lymphedema    venous insufficency  . Osteoarthritis   . Peripheral vascular disease ([employer])   . Ulcer of knee ([place])    right  . Urinary incontinence   . Varicose veins   . Venous insufficiency       Past Surgical History:  Procedure Laterality Date  . AMPUTATION Right 12/15/2017   Procedure: AMPUTATION ABOVE KNEE, RIGHT;  Surgeon: Elam Dutch, MD;  Location: [place];  Service: Vascular;  Laterality: Right;  . APPLICATION OF WOUND VAC Right 07/18/2017   Procedure: APPLICATION OF WOUND VAC;  Surgeon: [name], MD;  Location: [place];  Service: Orthopedics;  Laterality: Right;  . CATARACT EXTRACTION W/ INTRAOCULAR LENS  IMPLANT, BILATERAL    . COLONOSCOPY    . I&D EXTREMITY Right 07/18/2017   Procedure: IRRIGATION AND DEBRIDEMENT RIGHT KNEE;  Surgeon: [name], MD;  Location: [place];  Service: Orthopedics;  Laterality: Right;  . I&D EXTREMITY Right 07/23/2017   Procedure: REPEAT IRRIGATION AND DEBRIDEMENT RIGHT KNEE;  Surgeon: [name], MD;  Location: [place];  Service: Orthopedics;  Laterality: Right;  . I&D EXTREMITY Right 11/28/2017   Procedure: IRRIGATION AND DEBRIDEMENT RIGHT KNEE , APPLY INSTILLATION VAC;  Surgeon: [name], MD;  Location: [place];  Service: Orthopedics;  Laterality: Right;  . I&D EXTREMITY Right 12/03/2017   Procedure: REPEAT IRRIGATION AND DEBRIDEMENT RIGHT KNEE AND APPLICATION OF A WOUND VAC.;   Surgeon: [name], MD;  Location: [place];  Service: Orthopedics;  Laterality: Right;  . KNEE ARTHROSCOPY Left    menisectomy  . MULTIPLE TOOTH EXTRACTIONS    . SKIN SPLIT GRAFT Right 07/25/2017   Procedure: Repeat Irrigation and Debridement Right Knee, Split Thickness Skin Graft;  Surgeon: [name], MD;  Location: [place];  Service: Orthopedics;  Laterality: Right;  . SKIN SPLIT GRAFT Right 12/05/2017   Procedure: SKIN GRAFT SPLIT THICKNESS WOUND KNEE, APPLY VAC;  Surgeon: [name], MD;  Location: [place];  Service: Orthopedics;  Laterality: Right;  . TONSILLECTOMY      in for   Chief Complaint  Patient presents with  . Wound Infection     HPI  Shelby Reese  is a 83 y.o. female, with past medical history significant for peripheral vascular disease status post right above-knee amputation presenting with worsening ulcer of her left foot which did not improve on outpatient therapy with doxycycline.  Patient reports trauma in June  for dorsal aspect of her right foot and the wound has been getting worse.  Patient denies any fever or chills.  Patient is a retired Therapist, sports and lives usually at home with her husband that she usually takes care of .  In the emergency room the patient was noted to be anemic with guaiac positive stools and had acute kidney injury with creatinine up from 0.93 to2.30 .  Patient was started on clindamycin and Ancef after discussion with pharmacy due to multiple allergies.    Review of Systems    In addition to the HPI above,  No Fever-chills, No Headache, No changes with Vision or hearing, No problems swallowing food or Liquids, No Chest pain, Cough or Shortness of Breath, No Abdominal pain, No Nausea or Vommitting, Bowel movements are regular, No Blood in stool or Urine, No dysuria, No new skin rashes or bruises, No new joints pains-aches,  No new weakness, tingling, numbness in any extremity, No recent weight gain or loss, No polyuria, polydypsia or  polyphagia,   A full 10 point Review of Systems was done, except as stated above, all other Review of Systems were negative.   Social History Social History   Tobacco Use  . Smoking status: Former Smoker    Packs/day: 0.50    Years: 10.00    Pack years: 5.00    Last attempt to quit: 11/19/1979    Years since quitting: 38.2  . Smokeless tobacco: Never Used  Substance Use Topics  . Alcohol use: Yes    Alcohol/week: 8.4 oz    Types: 14 Glasses of wine per week    Comment: 2 glasses of wine with dinner     Family History Family History  Problem Relation Age of Onset  . Stroke Mother   . Varicose Veins Mother   . Cancer Father        prostate  . Stroke Sister   . Heart disease Sister   . Varicose Veins Sister   . Stroke Maternal Grandmother   . Varicose Veins Sister      Prior to Admission medications   Medication Sig Start Date End Date Taking? Authorizing Provider  amitriptyline (ELAVIL) 50 MG tablet Take 50 mg by mouth at bedtime.   Yes [provider]  aspirin EC 81 MG tablet Take 81 mg by mouth daily with breakfast.   Yes [provider]  benazepril (LOTENSIN) 40 MG tablet Take 40 mg by mouth daily.   Yes [provider]  Calcium Carbonate-Vitamin D (CALCIUM 600+D) 600-400 MG-UNIT per tablet Take 1 tablet by mouth daily at 3 pm.    Yes [provider]  cetirizine (ZYRTEC) 10 MG tablet Take 10 mg by mouth at bedtime.    Yes [provider]  docusate sodium (COLACE) 100 MG capsule Take 1 capsule (100 mg total) by mouth daily. 12/24/17  Yes Bonnell Public, MD  doxycycline (VIBRA-TABS) 100 MG tablet Take 100 mg by mouth 2 (two) times daily. For 10 days 02/19/18  Yes [provider]  escitalopram (LEXAPRO) 10 MG tablet Take 10 mg by mouth daily.   Yes [provider]  famotidine (PEPCID) 40 MG tablet Take 40 mg by mouth daily at 3 pm. 1600   Yes [provider]  feeding supplement, ENSURE ENLIVE,  (ENSURE ENLIVE) LIQD Take 237 mLs by mouth 3 (three) times daily with meals. 12/23/17  Yes Dana Allan I, MD  fentaNYL (DURAGESIC - DOSED MCG/HR) 25 MCG/HR patch Place 25 mcg  onto the skin every 3 (three) days.   Yes [provider]  ferrous sulfate 325 (65 FE) MG tablet Take 325 mg by mouth 2 (two) times daily with a meal.   Yes [provider]  furosemide (LASIX) 20 MG tablet Take 20 mg by mouth daily.    Yes [provider]  hydrALAZINE (APRESOLINE) 50 MG tablet Take 50 mg by mouth 4 (four) times daily.   Yes [provider]  methocarbamol (ROBAXIN) 500 MG tablet Take 500 mg by mouth every 6 (six) hours as needed for muscle spasms.   Yes [provider]  Multiple Vitamins-Minerals (MULTIVITAMIN WITH MINERALS) tablet Take 1 tablet by mouth daily.   Yes [provider]  oxyCODONE (OXY IR/ROXICODONE) 5 MG immediate release tablet Take 5 mg by mouth every 4 (four) hours. Every 30 mins before therapy    Yes [provider]  pantoprazole (PROTONIX) 40 MG tablet Take 1 tablet (40 mg total) by mouth daily. 12/24/17  Yes Dana Allan I, MD  polyethylene glycol (MIRALAX / GLYCOLAX) packet Take 17 g by mouth 2 (two) times daily. 12/23/17  Yes Bonnell Public, MD  senna-docusate (SENNA-PLUS) 8.6-50 MG tablet Take 2 tablets by mouth 2 (two) times daily.   Yes [provider]  SYNTHROID 175 MCG tablet Take 175 mcg by mouth daily before breakfast.  09/20/15  Yes [provider]  vitamin C (ASCORBIC ACID) 500 MG tablet Take 500 mg by mouth daily.   Yes [provider]  diltiazem (TIAZAC) 120 MG 24 hr capsule Take 120 mg by mouth daily with breakfast.  01/29/13   [provider]    Allergies  Allergen Reactions  . Cinnamon Hives  . Ciprofloxacin Other (See Comments)    TREMORS  . Diovan [Valsartan] Other (See Comments)    Extreme vertigo  . Food Diarrhea and Other (See Comments)    ORANGE JUICE   UPSET  STOMACH  . Latex Rash and Other (See Comments)    Rash/inflammation due to exposure  . Nitrofuran Derivatives Hives and Rash    "Full body rash"  . Penicillins Hives and Swelling    *tolerated Ceftriaxone September 2018 Has patient had a PCN reaction causing immediate rash, facial/tongue/throat swelling, SOB or lightheadedness with hypotension:No--severe irritation at the injection site Has patient had a PCN reaction causing severe rash involving mucus membranes or skin necrosis:Unknown Has patient had a PCN reaction that required hospitalization:No Has patient had a PCN reaction occurring within the last 10 years:Yes If all of the above answers are "NO", then may proceed with  . Bactrim [Sulfamethoxazole-Trimethoprim] Diarrhea and Nausea Only  . Other Rash    Mycins, Strawberry, Oranges  . Sulfa Antibiotics Diarrhea and Nausea Only    Physical Exam  Vitals  Blood pressure (!) 84/36, pulse 90, temperature 98.3 F (36.8 C), temperature source Oral, resp. rate 14, height 5\' 3"  (1.6 m), weight 81.3 kg (179 lb 3.7 oz), SpO2 93 %.   1. General elderly female very pleasant, well-nourished  2. Normal affect and insight, Not Suicidal or Homicidal, Awake Alert, Oriented X 3.  3. No F.N deficits, ALL C.Nerves Intact, Strength 5/5 all 4 extremities, Sensation intact all 4 extremities, Plantars down going.  4. Ears and Eyes appear Normal, Conjunctivae clear, PERRLA. Moist Oral Mucosa.  5. Supple Neck, No JVD, No cervical lymphadenopathy appriciated, No Carotid Bruits.  6. Symmetrical Chest wall movement, Good air movement bilaterally, CTAB.  7. RRR, No Gallops, Rubs or Murmurs,  No Parasternal Heave.  8. Positive Bowel Sounds, Abdomen Soft, Non tender, No organomegaly appriciated,No rebound -guarding or rigidity.  9.  No Cyanosis, Normal Skin Turgor, No Skin Rash or Bruise.  10. Good muscle tone, left foot vascular ulcer around 6 x 4 cm with surrounding redness and purulent drain,  status post right above-knee amputation.    Data Review  CBC Recent Labs  Lab 02/23/18 1518  WBC 9.9  HGB 6.9*  HCT 22.8*  PLT 435*  MCV 87.0  MCH 26.3  MCHC 30.3  RDW 16.9*  LYMPHSABS 1.3  MONOABS 0.8  EOSABS 0.5  BASOSABS 0.0   ------------------------------------------------------------------------------------------------------------------  Chemistries  Recent Labs  Lab 02/23/18 1518  NA 133*  K 4.7  CL 99*  CO2 24  GLUCOSE 109*  BUN 48*  CREATININE 2.30*  CALCIUM 9.7  AST 21  ALT 14  ALKPHOS 56  BILITOT 0.4   ------------------------------------------------------------------------------------------------------------------ estimated creatinine clearance is 19.7 mL/min (A) (by C-G formula based on SCr of 2.3 mg/dL (H)). ------------------------------------------------------------------------------------------------------------------ No results for input(s): TSH, T4TOTAL, T3FREE, THYROIDAB in the last 72 hours.  Invalid input(s): FREET3   Coagulation profile No results for input(s): INR, PROTIME in the last 168 hours. ------------------------------------------------------------------------------------------------------------------- No results for input(s): DDIMER in the last 72 hours. -------------------------------------------------------------------------------------------------------------------  Cardiac Enzymes No results for input(s): CKMB, TROPONINI, MYOGLOBIN in the last 168 hours.  Invalid input(s): CK ------------------------------------------------------------------------------------------------------------------ Invalid input(s): POCBNP   ---------------------------------------------------------------------------------------------------------------  Urinalysis    Component Value Date/Time   COLORURINE YELLOW 02/23/2018 2128   APPEARANCEUR HAZY (A) 02/23/2018 2128   LABSPEC 1.010 02/23/2018 2128   PHURINE 8.0 02/23/2018 2128    GLUCOSEU NEGATIVE 02/23/2018 2128   HGBUR NEGATIVE 02/23/2018 2128   BILIRUBINUR NEGATIVE 02/23/2018 2128   KETONESUR NEGATIVE 02/23/2018 2128   PROTEINUR NEGATIVE 02/23/2018 2128   NITRITE NEGATIVE 02/23/2018 2128   LEUKOCYTESUR LARGE (A) 02/23/2018 2128    ----------------------------------------------------------------------------------------------------------------   Imaging results:   Dg Foot Complete Left  Result Date: 02/23/2018 CLINICAL DATA:  Left foot redness, pain and swelling. Question osteomyelitis. EXAM: LEFT FOOT - COMPLETE 3+ VIEW COMPARISON:  None. FINDINGS: There appear to be skin wounds over the dorsum of the foot centered at the level of the cuneiforms, neck of the talus and tibiotalar joint. Lucencies are identified in the bones the midfoot, most conspicuous in the cuboid and lateral cuneiform as well as the fourth metatarsal. No fracture. Atherosclerosis noted. No soft tissue gas or radiopaque foreign body. IMPRESSION: Lucencies in the bones of the midfoot are nonspecific and could be due to early neuropathic change or osteomyelitis. Skin wounds on the dorsum of the foot. Electronically Signed   By: Inge Rise M.D.   On: 02/23/2018 15:55      Assessment & Plan  1.  Right foot vascular ulcer and cellulitis     X-ray shows possible osteomyelitis     Patient was started on clindamycin and Ancef.     Might need vascular studies and probable amputation of left foot     Consult vascular surgery in a.m.  2.  Anemia with guaiac-positive stools     Consult GI in a.m.     Continue with PPI     Blood transfusion  3.  Acute kidney injury     IV fluids     Blood transfusion     Monitor creatinine     Consult nephrology in a.m.  DVT Prophylaxis Heparin   AM Labs Ordered, also please review Full Orders  Code Status full  Disposition Plan: Undetermined  Time spent in minutes : 42 minutes  Condition fair   @SIGNATURE @

## 2018-02-23 NOTE — ED Provider Notes (Signed)
[facility] DEPT Provider Note   CSN: [id number] Arrival date & time: 02/23/18  1447     History   Chief Complaint Chief Complaint  Patient presents with  . Wound Infection    HPI   Blood pressure (!) 101/46, pulse 94, temperature 98.9 F (37.2 C), temperature source Oral, resp. rate 18, SpO2 100 %.  Shelby Reese is a 83 y.o. female with past medical history significant for CKD, PVD lymphedema status post right AKA complaining of worsening wound with enlarging area of involvement, pain and discharge over the course the last 3 days.  She has had the wound for approximately 1 year.  She was started on doxycycline 100 twice daily 4 days ago and she feels like is getting worse.  She denies any fevers, chills, nausea, vomiting.  She is seen Dr. Oneida Alar in the past, does not follow regularly with wound care, states that she would not like to follow with feels in the future.  Past Medical History:  Diagnosis Date  . Allergy   . Anemia   . CKD (chronic kidney disease), stage II   . Fibromyalgia   . GERD (gastroesophageal reflux disease)   . Hiatal hernia   . Hypertension   . Hypothyroid   . Lymphedema    venous insufficency  . Osteoarthritis   . Peripheral vascular disease ([place])   . Ulcer of knee ([place])    right  . Urinary incontinence   . Varicose veins   . Venous insufficiency     Patient Active Problem List   Diagnosis Date Noted  . Anemia 02/23/2018  . Gangrene of foot ([place]) 02/18/2018  . Depression due to physical illness 02/18/2018  . Chronic ulcer of right heel with necrosis of bone ([place]) 12/27/2017  . Peripheral vascular disease of lower extremity with ulceration ([place]) 12/27/2017  . Acute renal failure superimposed on stage 2 chronic kidney disease ([place]) 12/27/2017  . GERD (gastroesophageal reflux disease) 12/27/2017  . Acute blood loss as cause of postoperative anemia 12/13/2017  . Wound of right leg, sequela 12/12/2017  . Open wound  of right knee 11/28/2017  . Allergic drug rash   . Sepsis ([place]) 08/29/2017  . CKD (chronic kidney disease), stage III ([place]) 08/29/2017  . Hypothyroid 08/29/2017  . Hypertension 08/29/2017  . Peripheral vascular disease ([place]) 08/29/2017  . Skin ulcer of knee, right, with fat layer exposed ([place]) 08/29/2017  . Acute kidney injury ([place]) 08/29/2017  . Metabolic acidosis 75/64/3329  . Acute hyponatremia 08/29/2017  . Pressure injury of skin 07/25/2017  . Wound, open, knee, lower leg, or ankle with complication, right, initial encounter   . Idiopathic chronic venous hypertension of both lower extremities with ulcer and inflammation ([place]) 07/15/2017  . Skin ulcer of right knee with necrosis of muscle ([place]) 07/15/2017  . Atherosclerosis of artery of right lower extremity ([place]) 07/11/2017  . Essential hypertension 07/11/2017  . Osteoporosis 07/11/2017  . Cataracts, bilateral 07/11/2017  . Hypothyroidism, acquired 12/27/2014  . Iron deficiency anemia 12/27/2014  . Hiatal hernia 12/27/2014  . Varicose veins of lower extremities with other complications 51/88/4166    Past Surgical History:  Procedure Laterality Date  . AMPUTATION Right 12/15/2017   Procedure: AMPUTATION ABOVE KNEE, RIGHT;  Surgeon: Elam Dutch, MD;  Location: [facility];  Service: Vascular;  Laterality: Right;  . APPLICATION OF WOUND VAC Right 07/18/2017   Procedure: APPLICATION OF WOUND VAC;  Surgeon: [name], MD;  Location: [facility];  Service: Orthopedics;  Laterality: Right;  . CATARACT EXTRACTION W/ INTRAOCULAR LENS  IMPLANT, BILATERAL    . COLONOSCOPY    . I&D EXTREMITY Right 07/18/2017   Procedure: IRRIGATION AND DEBRIDEMENT RIGHT KNEE;  Surgeon: [name], MD;  Location: [facility];  Service: Orthopedics;  Laterality: Right;  . I&D EXTREMITY Right 07/23/2017   Procedure: REPEAT IRRIGATION AND DEBRIDEMENT RIGHT KNEE;  Surgeon: [name], MD;  Location: [facility];  Service: Orthopedics;  Laterality: Right;  . I&D  EXTREMITY Right 11/28/2017   Procedure: IRRIGATION AND DEBRIDEMENT RIGHT KNEE , APPLY INSTILLATION VAC;  Surgeon: [name], MD;  Location: [facility];  Service: Orthopedics;  Laterality: Right;  . I&D EXTREMITY Right 12/03/2017   Procedure: REPEAT IRRIGATION AND DEBRIDEMENT RIGHT KNEE AND APPLICATION OF A WOUND VAC.;  Surgeon: [name], MD;  Location: [facility];  Service: Orthopedics;  Laterality: Right;  . KNEE ARTHROSCOPY Left    menisectomy  . MULTIPLE TOOTH EXTRACTIONS    . SKIN SPLIT GRAFT Right 07/25/2017   Procedure: Repeat Irrigation and Debridement Right Knee, Split Thickness Skin Graft;  Surgeon: [name], MD;  Location: [facility];  Service: Orthopedics;  Laterality: Right;  . SKIN SPLIT GRAFT Right 12/05/2017   Procedure: SKIN GRAFT SPLIT THICKNESS WOUND KNEE, APPLY VAC;  Surgeon: [name], MD;  Location: [facility];  Service: Orthopedics;  Laterality: Right;  . TONSILLECTOMY       OB History   None      Home Medications    Prior to Admission medications   Medication Sig Start Date End Date Taking? Authorizing Provider  amitriptyline (ELAVIL) 50 MG tablet Take 50 mg by mouth at bedtime.   Yes [provider]  aspirin EC 81 MG tablet Take 81 mg by mouth daily with breakfast.   Yes [provider]  benazepril (LOTENSIN) 40 MG tablet Take 40 mg by mouth daily.   Yes [provider]  Calcium Carbonate-Vitamin D (CALCIUM 600+D) 600-400 MG-UNIT per tablet Take 1 tablet by mouth daily at 3 pm.    Yes [provider]  cetirizine (ZYRTEC) 10 MG tablet Take 10 mg by mouth at bedtime.    Yes [provider]  docusate sodium (COLACE) 100 MG capsule Take 1 capsule (100 mg total) by mouth daily. 12/24/17  Yes Bonnell Public, MD  doxycycline (VIBRA-TABS) 100 MG tablet Take 100 mg by mouth 2 (two) times daily. For 10 days 02/19/18  Yes [provider]  escitalopram (LEXAPRO) 10 MG tablet Take 10 mg by mouth daily.   Yes [provider]  famotidine (PEPCID) 40 MG tablet Take 40 mg by mouth daily at 3 pm. 1600   Yes [provider]  feeding supplement, ENSURE ENLIVE, (ENSURE ENLIVE) LIQD Take 237 mLs by mouth 3 (three) times daily with meals. 12/23/17  Yes Dana Allan I, MD  fentaNYL (DURAGESIC - DOSED MCG/HR) 25 MCG/HR patch Place 25 mcg onto the skin every 3 (three) days.   Yes [provider]  ferrous sulfate 325 (65 FE) MG tablet Take 325 mg by mouth 2 (two) times daily with a meal.   Yes [provider]  furosemide (LASIX) 20 MG tablet Take 20 mg by mouth daily.    Yes [provider]  hydrALAZINE (APRESOLINE) 50 MG tablet Take 50 mg by mouth 4 (four) times daily.   Yes [provider]  methocarbamol (ROBAXIN) 500 MG tablet Take 500 mg by mouth every  6 (six) hours as needed for muscle spasms.   Yes [provider]  Multiple Vitamins-Minerals (MULTIVITAMIN WITH MINERALS) tablet Take 1 tablet by mouth daily.   Yes [provider]  oxyCODONE (OXY IR/ROXICODONE) 5 MG immediate release tablet Take 5 mg by mouth every 4 (four) hours. Every 30 mins before therapy    Yes [provider]  pantoprazole (PROTONIX) 40 MG tablet Take 1 tablet (40 mg total) by mouth daily. 12/24/17  Yes Dana Allan I, MD  polyethylene glycol (MIRALAX / GLYCOLAX) packet Take 17 g by mouth 2 (two) times daily. 12/23/17  Yes Bonnell Public, MD  senna-docusate (SENNA-PLUS) 8.6-50 MG tablet Take 2 tablets by mouth 2 (two) times daily.   Yes [provider]  SYNTHROID 175 MCG tablet Take 175 mcg by mouth daily before breakfast.  09/20/15  Yes [provider]  vitamin C (ASCORBIC ACID) 500 MG tablet Take 500 mg by mouth daily.   Yes [provider]  diltiazem (TIAZAC) 120 MG 24 hr capsule Take 120 mg by mouth daily with breakfast.  01/29/13   [provider]    Family History Family History  Problem Relation Age of Onset  . Stroke  Mother   . Varicose Veins Mother   . Cancer Father        prostate  . Stroke Sister   . Heart disease Sister   . Varicose Veins Sister   . Stroke Maternal Grandmother   . Varicose Veins Sister     Social History Social History   Tobacco Use  . Smoking status: Former Smoker    Packs/day: 0.50    Years: 10.00    Pack years: 5.00    Last attempt to quit: 11/19/1979    Years since quitting: 38.2  . Smokeless tobacco: Never Used  Substance Use Topics  . Alcohol use: Yes    Alcohol/week: 8.4 oz    Types: 14 Glasses of wine per week    Comment: 2 glasses of wine with dinner  . Drug use: No     Allergies   Cinnamon; Ciprofloxacin; Diovan [valsartan]; Food; Latex; Nitrofuran derivatives; Penicillins; Bactrim [sulfamethoxazole-trimethoprim]; Other; and Sulfa antibiotics   Review of Systems Review of Systems  A complete review of systems was obtained and all systems are negative except as noted in the HPI and PMH.   Physical Exam Updated Vital Signs BP (!) 110/94   Pulse 74   Temp 98.2 F (36.8 C) (Oral)   Resp 17   SpO2 100%   Physical Exam  Constitutional: She is oriented to person, place, and time. She appears well-developed and well-nourished. No distress.  HENT:  Head: Normocephalic and atraumatic.  Mouth/Throat: Oropharynx is clear and moist.  Eyes: Pupils are equal, round, and reactive to light. Conjunctivae and EOM are normal.  Neck: Normal range of motion.  Cardiovascular: Normal rate, regular rhythm and intact distal pulses.  Pulmonary/Chest: Effort normal and breath sounds normal.  Abdominal: Soft. There is no tenderness.  Genitourinary:  Genitourinary Comments: Digital rectal exam is chaperoned by RN, normal rectal tone, dark stool color  Musculoskeletal: Normal range of motion.  Right AKA  Neurological: She is alert and oriented to person, place, and time.  Skin: She is not diaphoretic.  Psychiatric: She has a normal mood and affect.  Nursing note and  vitals reviewed.        ED Treatments / Results  Labs (all labs ordered are listed, but only abnormal results are displayed) Labs  Reviewed  CBC WITH DIFFERENTIAL/PLATELET - Abnormal; Notable for the following components:      Result Value   RBC 2.62 (*)    Hemoglobin 6.9 (*)    HCT 22.8 (*)    RDW 16.9 (*)    Platelets 435 (*)    All other components within normal limits  COMPREHENSIVE METABOLIC PANEL - Abnormal; Notable for the following components:   Sodium 133 (*)    Chloride 99 (*)    Glucose, Bld 109 (*)    BUN 48 (*)    Creatinine, Ser 2.30 (*)    Albumin 3.2 (*)    GFR calc non Af Amer 19 (*)    GFR calc Af Amer 22 (*)    All other components within normal limits  RETICULOCYTES - Abnormal; Notable for the following components:   RBC. 2.59 (*)    All other components within normal limits  POC OCCULT BLOOD, ED - Abnormal; Notable for the following components:   Fecal Occult Bld POSITIVE (*)    All other components within normal limits  CULTURE, BLOOD (ROUTINE X 2)  CULTURE, BLOOD (ROUTINE X 2)  URINE CULTURE  CBC WITH DIFFERENTIAL/PLATELET  VITAMIN B12  FOLATE  IRON AND TIBC  FERRITIN  URINALYSIS, ROUTINE W REFLEX MICROSCOPIC  VITAMIN B12  FOLATE  IRON AND TIBC  FERRITIN  RETICULOCYTES  I-STAT CG4 LACTIC ACID, ED  TYPE AND SCREEN  PREPARE RBC (CROSSMATCH)  TYPE AND SCREEN  PREPARE RBC (CROSSMATCH)    EKG None  Radiology Dg Foot Complete Left  Result Date: 02/23/2018 CLINICAL DATA:  Left foot redness, pain and swelling. Question osteomyelitis. EXAM: LEFT FOOT - COMPLETE 3+ VIEW COMPARISON:  None. FINDINGS: There appear to be skin wounds over the dorsum of the foot centered at the level of the cuneiforms, neck of the talus and tibiotalar joint. Lucencies are identified in the bones the midfoot, most conspicuous in the cuboid and lateral cuneiform as well as the fourth metatarsal. No fracture. Atherosclerosis noted. No soft tissue gas or radiopaque  foreign body. IMPRESSION: Lucencies in the bones of the midfoot are nonspecific and could be due to early neuropathic change or osteomyelitis. Skin wounds on the dorsum of the foot. Electronically Signed   By: Inge Rise M.D.   On: 02/23/2018 15:55    Procedures Procedures (including critical care time)  CRITICAL CARE Performed by: Elmyra Ricks Ilze Roselli   Total critical care time: 40 minutes  Critical care time was exclusive of separately billable procedures and treating other patients.  Critical care was necessary to treat or prevent imminent or life-threatening deterioration.  Critical care was time spent personally by me on the following activities: development of treatment plan with patient and/or surrogate as well as nursing, discussions with consultants, evaluation of patient's response to treatment, examination of patient, obtaining history from patient or surrogate, ordering and performing treatments and interventions, ordering and review of laboratory studies, ordering and review of radiographic studies, pulse oximetry and re-evaluation of patient's condition.   Medications Ordered in ED Medications  0.9 %  sodium chloride infusion (has no administration in time range)  0.9 %  sodium chloride infusion (has no administration in time range)  acetaminophen (TYLENOL) tablet 650 mg (has no administration in time range)  diphenhydrAMINE (BENADRYL) capsule 25 mg (has no administration in time range)  furosemide (LASIX) injection 20 mg (has no administration in time range)  morphine 4 MG/ML injection 4 mg (4 mg Intravenous Given 02/23/18 1600)  ondansetron (ZOFRAN) injection  4 mg (4 mg Intravenous Given 02/23/18 1600)  clindamycin (CLEOCIN) IVPB 600 mg (0 mg Intravenous Stopped 02/23/18 1804)  ceFAZolin (ANCEF) IVPB 1 g/50 mL premix (0 g Intravenous Stopped 02/23/18 1709)  pantoprazole (PROTONIX) 80 mg in sodium chloride 0.9 % 100 mL IVPB (0 mg Intravenous Stopped 02/23/18 1838)  HYDROmorphone  (DILAUDID) injection 0.5 mg (0.5 mg Intravenous Given 02/23/18 1736)     Initial Impression / Assessment and Plan / ED Course  I have reviewed the triage vital signs and the nursing notes.  Pertinent labs & imaging results that were available during my care of the patient were reviewed by me and considered in my medical decision making (see chart for details).    Vitals:   02/23/18 1709 02/23/18 1839 02/23/18 1848 02/23/18 1915  BP:  (!) 107/55 (!) 115/42 (!) 110/94  Pulse: 82 80 80 74  Resp: 20 18 13 17   Temp: 98 F (36.7 C) 98.2 F (36.8 C) 98.2 F (36.8 C)   TempSrc: Rectal Oral Oral   SpO2: 100% 99% 100% 100%    Medications  0.9 %  sodium chloride infusion (has no administration in time range)  0.9 %  sodium chloride infusion (has no administration in time range)  acetaminophen (TYLENOL) tablet 650 mg (has no administration in time range)  diphenhydrAMINE (BENADRYL) capsule 25 mg (has no administration in time range)  furosemide (LASIX) injection 20 mg (has no administration in time range)  morphine 4 MG/ML injection 4 mg (4 mg Intravenous Given 02/23/18 1600)  ondansetron (ZOFRAN) injection 4 mg (4 mg Intravenous Given 02/23/18 1600)  clindamycin (CLEOCIN) IVPB 600 mg (0 mg Intravenous Stopped 02/23/18 1804)  ceFAZolin (ANCEF) IVPB 1 g/50 mL premix (0 g Intravenous Stopped 02/23/18 1709)  pantoprazole (PROTONIX) 80 mg in sodium chloride 0.9 % 100 mL IVPB (0 mg Intravenous Stopped 02/23/18 1838)  HYDROmorphone (DILAUDID) injection 0.5 mg (0.5 mg Intravenous Given 02/23/18 1736)    Shelby Reese is 83 y.o. female presenting with poorly healing left lower extremity ulcerations, she has a history of PVD.  Been on doxycycline at home but feels that the areas of ulcerations are getting worse.  Discussed with infectious disease pharmacist who given her penicillin, Cipro and sulfa allergy recommends Clinda and cefazolin.  Hemoglobin is found to be low at 5.6, she is required transfusion several  months ago, this is thought to be secondary to chronic renal insufficiency however, stool is dark and guaiac is positive, patient started on Protonix bolus.  Put in for 2 units PRBCs.  Platelets slightly elevated, she has an AK I with a creatinine of 2.5 which is atypical for her.  X-ray of foot with lucency in the midfoot, nonspecific but might be secondary to neuropathic change versus osteomyelitis.  Admitted to [facility] hospitalist, discussed with gastroenterology Dr. Alessandra Bevels who will come to evaluate the patient.  Final Clinical Impressions(s) / ED Diagnoses   Final diagnoses:  Anemia, unspecified type  Fecal occult blood test positive  Cellulitis of right foot  PVD (peripheral vascular disease) [facility])    ED Discharge Orders    None       Karen Kays Charna Elizabeth 02/23/18 1931    Davonna Belling, MD 02/23/18 2337

## 2018-02-23 NOTE — Progress Notes (Signed)
Pharmacy Antibiotic Note  KATELAN HIRT is a 83 y.o. female admitted on 02/23/2018 with left lower extremity cellulitis/wound, failed outpatient Doxycycline therapy. X-ray of L foot: lucencies in the bones of the midfoot are nonspecific and could be due to early neuropathic change or osteomyelitis.   Pharmacy has been consulted for Cefazolin dosing. Note penicillin allergy, but has tolerated multiple cephalosporins in the past and tolerated dose of Cefazolin in ED. Patient also placed on Clindamycin per MD.   Plan: Cefazolin 1g IV q12h. Clindamycin 600mg  IV q8h per MD. Monitor renal function, cultures, clinical course.   Height: 5\' 3"  (160 cm) Weight: 179 lb 3.7 oz (81.3 kg) IBW/kg (Calculated) : 52.4  Temp (24hrs), Avg:98.3 F (36.8 C), Min:98 F (36.7 C), Max:98.9 F (37.2 C)  Recent Labs  Lab 02/23/18 1518 02/23/18 1605  WBC 9.9  --   CREATININE 2.30*  --   LATICACIDVEN  --  0.63    Estimated Creatinine Clearance: 19.7 mL/min (A) (by C-G formula based on SCr of 2.3 mg/dL (H)).    Allergies  Allergen Reactions  . Cinnamon Hives  . Ciprofloxacin Other (See Comments)    TREMORS  . Diovan [Valsartan] Other (See Comments)    Extreme vertigo  . Food Diarrhea and Other (See Comments)    ORANGE JUICE   UPSET STOMACH  . Latex Rash and Other (See Comments)    Rash/inflammation due to exposure  . Nitrofuran Derivatives Hives and Rash    "Full body rash"  . Penicillins Hives and Swelling    *tolerated Ceftriaxone September 2018 Has patient had a PCN reaction causing immediate rash, facial/tongue/throat swelling, SOB or lightheadedness with hypotension:No--severe irritation at the injection site Has patient had a PCN reaction causing severe rash involving mucus membranes or skin necrosis:Unknown Has patient had a PCN reaction that required hospitalization:No Has patient had a PCN reaction occurring within the last 10 years:Yes If all of the above answers are "NO", then may  proceed with  . Bactrim [Sulfamethoxazole-Trimethoprim] Diarrhea and Nausea Only  . Other Rash    Mycins, Strawberry, Oranges  . Sulfa Antibiotics Diarrhea and Nausea Only    Antimicrobials this admission: 4/8 >> Cefazolin >> 4/8 >> Clindamycin >>  Dose adjustments this admission: --  Microbiology results: 4/8 BCx: sent 4/8 UCx: sent    Thank you for allowing pharmacy to be a part of this patient's care.  Luiz Ochoa 02/23/2018 9:45 PM

## 2018-02-23 NOTE — ED Notes (Signed)
Date and time results received: 02/23/18 16:26 (use smartphrase ".now" to insert current time)  Test: HGB Critical Value: 6.9   Name of Provider Notified: Monico Blitz, PA of patients results via telephone. Informed Daryll Drown, RN (primary nurse) via radio.   Orders Received? Or Actions Taken?: Will continue to monitor patient and await new orders.

## 2018-02-23 NOTE — ED Notes (Signed)
ED TO INPATIENT HANDOFF REPORT  Name/Age/Gender Shelby Reese 83 y.o. female  Code Status Code Status History    Date Active Date Inactive Code Status Order ID Comments User Context   12/12/2017 2301 12/23/2017 1926 Full Code [id number]  Desiree Hane, MD Inpatient   11/28/2017 1307 12/08/2017 1808 Full Code [id number]  [name], MD Inpatient   08/29/2017 1810 09/04/2017 1946 Full Code [id number]  Samella Parr, NP Inpatient   07/18/2017 0341 07/29/2017 2238 Full Code [id number]  Danford, Suann Larry, MD Inpatient      Home/SNF/Other Skilled nursing facility  Chief Complaint Foot Infection  Level of Care/Admitting Diagnosis ED Disposition    ED Disposition Condition [place]: [place] [100102]  Level of Care: Stepdown [14]  Admit to SDU based on following criteria: Hemodynamic compromise or significant risk of instability:  Patient requiring short term acute titration and management of vasoactive drips, and invasive monitoring (i.e., CVP and Arterial line).  Diagnosis: Anemia [976734]  Admitting Physician: Eugenie Filler [[id number]]  Attending Physician: Eugenie Filler [[id number]]  Estimated length of stay: past midnight tomorrow  Certification:: I certify this patient will need inpatient services for at least 2 midnights  PT Class (Do Not Modify): Inpatient [101]  PT Acc Code (Do Not Modify): Private [1]       Medical History Past Medical History:  Diagnosis Date  . Allergy   . Anemia   . CKD (chronic kidney disease), stage II   . Fibromyalgia   . GERD (gastroesophageal reflux disease)   . Hiatal hernia   . Hypertension   . Hypothyroid   . Lymphedema    venous insufficency  . Osteoarthritis   . Peripheral vascular disease ([place])   . Ulcer of knee ([place])    right  . Urinary incontinence   . Varicose veins   . Venous insufficiency     Allergies Allergies  Allergen Reactions  . Cinnamon Hives  . Ciprofloxacin  Other (See Comments)    TREMORS  . Diovan [Valsartan] Other (See Comments)    Extreme vertigo  . Food Diarrhea and Other (See Comments)    ORANGE JUICE   UPSET STOMACH  . Latex Rash and Other (See Comments)    Rash/inflammation due to exposure  . Nitrofuran Derivatives Hives and Rash    "Full body rash"  . Penicillins Hives and Swelling    *tolerated Ceftriaxone September 2018 Has patient had a PCN reaction causing immediate rash, facial/tongue/throat swelling, SOB or lightheadedness with hypotension:No--severe irritation at the injection site Has patient had a PCN reaction causing severe rash involving mucus membranes or skin necrosis:Unknown Has patient had a PCN reaction that required hospitalization:No Has patient had a PCN reaction occurring within the last 10 years:Yes If all of the above answers are "NO", then may proceed with  . Bactrim [Sulfamethoxazole-Trimethoprim] Diarrhea and Nausea Only  . Other Rash    Mycins, Strawberry, Oranges  . Sulfa Antibiotics Diarrhea and Nausea Only    IV Location/Drains/Wounds Patient Lines/Drains/Airways Status   Active Line/Drains/Airways    Name:   Placement date:   Placement time:   Site:   Days:   Peripheral IV 02/23/18 Right Antecubital   02/23/18    1545    Antecubital   less than 1   Peripheral IV 02/23/18 Right Forearm   02/23/18    1805    Forearm   less than 1   Negative Pressure Wound Therapy  Knee Right   07/23/17    1239    -   215   External Urinary Catheter   12/16/17    1142    -   69   Incision (Closed) 07/23/17 Knee Right   07/23/17    1227     215   Incision (Closed) 07/25/17 Leg Right   07/25/17    0828     213   Incision (Closed) 11/28/17 Leg Right   11/28/17    1123     87   Incision (Closed) 12/03/17 Knee Right   12/03/17    1403     82   Incision (Closed) 12/05/17 Leg Right   12/05/17    0835     80   Incision (Closed) 12/15/17 Leg Right   12/15/17    1556     70   Pressure Injury 07/25/17 Deep Tissue Injury -  Purple or maroon localized area of discolored intact skin or blood-filled blister due to damage of underlying soft tissue from pressure and/or shear.   07/25/17    -     213   Pressure Injury 08/29/17 Unstageable - Full thickness tissue loss in which the base of the ulcer is covered by slough (yellow, tan, gray, green or brown) and/or eschar (tan, brown or black) in the wound bed. Eschar   08/29/17    2015     178   Pressure Injury 12/05/17 Deep Tissue Injury - Purple or maroon localized area of discolored intact skin or blood-filled blister due to damage of underlying soft tissue from pressure and/or shear. WOC discovered with removal of 4 layer compression wrap. U   12/05/17    1444     80   Wound / Incision (Open or Dehisced) 07/18/17 Other (Comment) Knee Right   07/18/17    0843    Knee   220   Wound / Incision (Open or Dehisced) 08/29/17 Other (Comment);Non-pressure wound Knee Right   08/29/17    2015    Knee   178   Wound / Incision (Open or Dehisced) 08/29/17 Non-pressure wound Leg Posterior;Left   08/29/17    2015    Leg   178          Labs/Imaging Results for orders placed or performed during the hospital encounter of 02/23/18 (from the past 48 hour(s))  CBC with Differential     Status: Abnormal   Collection Time: 02/23/18  3:18 PM  Result Value Ref Range   WBC 9.9 4.0 - 10.5 K/uL   RBC 2.62 (L) 3.87 - 5.11 MIL/uL   Hemoglobin 6.9 (LL) 12.0 - 15.0 g/dL    Comment: REPEATED TO VERIFY CRITICAL RESULT CALLED TO, READ BACK BY AND VERIFIED WITH: Dolores Hoose [id number] @ 1626 BY J SCOTTON    HCT 22.8 (L) 36.0 - 46.0 %   MCV 87.0 78.0 - 100.0 fL   MCH 26.3 26.0 - 34.0 pg   MCHC 30.3 30.0 - 36.0 g/dL   RDW 16.9 (H) 11.5 - 15.5 %   Platelets 435 (H) 150 - 400 K/uL   Neutrophils Relative % 72 %   Neutro Abs 7.2 1.7 - 7.7 K/uL   Lymphocytes Relative 14 %   Lymphs Abs 1.3 0.7 - 4.0 K/uL   Monocytes Relative 9 %   Monocytes Absolute 0.8 0.1 - 1.0 K/uL   Eosinophils Relative 5 %    Eosinophils Absolute 0.5 0.0 - 0.7 K/uL   Basophils Relative  0 %   Basophils Absolute 0.0 0.0 - 0.1 K/uL    Comment: Performed at [facility], [place] [address] [place], [place] [address]  Comprehensive metabolic panel     Status: Abnormal   Collection Time: 02/23/18  3:18 PM  Result Value Ref Range   Sodium 133 (L) 135 - 145 mmol/L   Potassium 4.7 3.5 - 5.1 mmol/L   Chloride 99 (L) 101 - 111 mmol/L   CO2 24 22 - 32 mmol/L   Glucose, Bld 109 (H) 65 - 99 mg/dL   BUN 48 (H) 6 - 20 mg/dL   Creatinine, Ser 2.30 (H) 0.44 - 1.00 mg/dL   Calcium 9.7 8.9 - 10.3 mg/dL   Total Protein 7.3 6.5 - 8.1 g/dL   Albumin 3.2 (L) 3.5 - 5.0 g/dL   AST 21 15 - 41 U/L   ALT 14 14 - 54 U/L   Alkaline Phosphatase 56 38 - 126 U/L   Total Bilirubin 0.4 0.3 - 1.2 mg/dL   GFR calc non Af Amer 19 (L) >60 mL/min   GFR calc Af Amer 22 (L) >60 mL/min    Comment: (NOTE) The eGFR has been calculated using the CKD EPI equation. This calculation has not been validated in all clinical situations. eGFR's persistently <60 mL/min signify possible Chronic Kidney Disease.    Anion gap 10 5 - 15    Comment: Performed at [facility], [place] [address] [place], [place] [address]  Reticulocytes     Status: Abnormal   Collection Time: 02/23/18  3:18 PM  Result Value Ref Range   Retic Ct Pct 2.1 0.4 - 3.1 %   RBC. 2.59 (L) 3.87 - 5.11 MIL/uL   Retic Count, Absolute 54.4 19.0 - 186.0 K/uL    Comment: Performed at [facility], [place] [address], [place], [place] [address]  I-Stat CG4 Lactic Acid, ED     Status: None   Collection Time: 02/23/18  4:05 PM  Result Value Ref Range   Lactic Acid, Venous 0.63 0.5 - 1.9 mmol/L  Type and screen     Status: None (Preliminary result)   Collection Time: 02/23/18  4:30 PM  Result Value Ref Range   ABO/RH(D) A POS    Antibody Screen NEG    Sample Expiration 02/26/2018    Unit Number [id number]    Blood Component Type RED  CELLS,LR    Unit division 00    Status of Unit ISSUED    Transfusion Status OK TO TRANSFUSE    Crossmatch Result      Compatible Performed at [facility], [place] [address], [place], [place] [address]    Unit Number [id number]    Blood Component Type RED CELLS,LR    Unit division 00    Status of Unit ALLOCATED    Transfusion Status OK TO TRANSFUSE    Crossmatch Result Compatible   POC occult blood, ED Provider will collect     Status: Abnormal   Collection Time: 02/23/18  5:10 PM  Result Value Ref Range   Fecal Occult Bld POSITIVE (A) NEGATIVE  Prepare RBC     Status: None   Collection Time: 02/23/18  5:21 PM  Result Value Ref Range   Order Confirmation      ORDER PROCESSED BY BLOOD BANK Performed at [facility], [place] [address], [place], [place] [address]    Dg Foot Complete Left  Result Date: 02/23/2018 CLINICAL DATA:  Left  foot redness, pain and swelling. Question osteomyelitis. EXAM: LEFT FOOT - COMPLETE 3+ VIEW COMPARISON:  None. FINDINGS: There appear to be skin wounds over the dorsum of the foot centered at the level of the cuneiforms, neck of the talus and tibiotalar joint. Lucencies are identified in the bones the midfoot, most conspicuous in the cuboid and lateral cuneiform as well as the fourth metatarsal. No fracture. Atherosclerosis noted. No soft tissue gas or radiopaque foreign body. IMPRESSION: Lucencies in the bones of the midfoot are nonspecific and could be due to early neuropathic change or osteomyelitis. Skin wounds on the dorsum of the foot. Electronically Signed   By: Inge Rise M.D.   On: 02/23/2018 15:55    Pending Labs Unresulted Labs (From admission, onward)   Start     Ordered   02/23/18 1844  Prepare RBC  (Adult Blood Administration - Red Blood Cells)  Once,   R    Question Answer Comment  # of Units 2 units   Transfusion Indications Actively Bleeding / GI Bleed   If emergent release call blood bank [facility] [phone]      02/23/18 1843   02/23/18 1843  Type and screen [place]  Once,   R    Comments:  [facility]    02/23/18 1843   02/23/18 1839  Urine culture  STAT,   STAT     02/23/18 1839   02/23/18 1838  Vitamin B12  (Anemia Panel (PNL))  Once,   R     02/23/18 1838   02/23/18 1838  Folate  (Anemia Panel (PNL))  Once,   R     02/23/18 1838   02/23/18 1838  Iron and TIBC  (Anemia Panel (PNL))  Once,   R     02/23/18 1838   02/23/18 1838  Ferritin  (Anemia Panel (PNL))  Once,   R     02/23/18 1838   02/23/18 1838  Reticulocytes  (Anemia Panel (PNL))  Once,   R     02/23/18 1838   02/23/18 1632  Urinalysis, Routine w reflex microscopic  Once,   R     02/23/18 1631   02/23/18 1630  Vitamin B12  (Anemia Panel (PNL))  Once,   R     02/23/18 1630   02/23/18 1630  Folate  (Anemia Panel (PNL))  Once,   R     02/23/18 1630   02/23/18 1630  Iron and TIBC  (Anemia Panel (PNL))  Once,   R     02/23/18 1630   02/23/18 1630  Ferritin  (Anemia Panel (PNL))  Once,   R     02/23/18 1630   02/23/18 1518  CBC with Differential  STAT,   STAT     02/23/18 1521   02/23/18 1518  Blood culture (routine x 2)  BLOOD CULTURE X 2,   STAT     02/23/18 1521      Vitals/Pain Today's Vitals   02/23/18 1709 02/23/18 1818 02/23/18 1839 02/23/18 1848  BP:   (!) 107/55 (!) 115/42  Pulse: 82  80 80  Resp: _0 Temp: 98 F (36.7 C)  98.2 F (36.8 C) 98.2 F (36.8 C)  TempSrc: Rectal  Oral Oral  SpO2: 100%  99% 100%  PainSc:  3       Isolation Precautions No active isolations  Medications Medications  0.9 %  sodium chloride infusion (has no administration in  time range)  0.9 %  sodium chloride infusion (has no administration in time range)  acetaminophen (TYLENOL) tablet 650 mg (has no administration in time range)  diphenhydrAMINE (BENADRYL) capsule 25 mg (has no administration in time range)  furosemide (LASIX) injection 20 mg (has no  administration in time range)  morphine 4 MG/ML injection 4 mg (4 mg Intravenous Given 02/23/18 1600)  ondansetron (ZOFRAN) injection 4 mg (4 mg Intravenous Given 02/23/18 1600)  clindamycin (CLEOCIN) IVPB 600 mg (0 mg Intravenous Stopped 02/23/18 1804)  ceFAZolin (ANCEF) IVPB 1 g/50 mL premix (0 g Intravenous Stopped 02/23/18 1709)  pantoprazole (PROTONIX) 80 mg in sodium chloride 0.9 % 100 mL IVPB (0 mg Intravenous Stopped 02/23/18 1838)  HYDROmorphone (DILAUDID) injection 0.5 mg (0.5 mg Intravenous Given 02/23/18 1736)    Mobility non-ambulatory

## 2018-02-23 NOTE — ED Triage Notes (Signed)
Transported by [facility] from [facility] assisted living--currently being treated for wound infection to left foot. Foot is dressed with gauze upon arrival. Pt reports increasing redness, pain and drainage.

## 2018-02-24 ENCOUNTER — Inpatient Hospital Stay (HOSPITAL_COMMUNITY): Payer: Medicare Other

## 2018-02-24 ENCOUNTER — Other Ambulatory Visit: Payer: Self-pay | Admitting: *Deleted

## 2018-02-24 ENCOUNTER — Other Ambulatory Visit: Payer: Self-pay

## 2018-02-24 DIAGNOSIS — I70244 Atherosclerosis of native arteries of left leg with ulceration of heel and midfoot: Secondary | ICD-10-CM

## 2018-02-24 DIAGNOSIS — I96 Gangrene, not elsewhere classified: Secondary | ICD-10-CM

## 2018-02-24 DIAGNOSIS — I739 Peripheral vascular disease, unspecified: Secondary | ICD-10-CM

## 2018-02-24 LAB — CBC
HCT: 26.7 % — ABNORMAL LOW (ref 36.0–46.0)
Hemoglobin: 8.2 g/dL — ABNORMAL LOW (ref 12.0–15.0)
MCH: 26.5 pg (ref 26.0–34.0)
MCHC: 30.7 g/dL (ref 30.0–36.0)
MCV: 86.1 fL (ref 78.0–100.0)
PLATELETS: 338 10*3/uL (ref 150–400)
RBC: 3.1 MIL/uL — AB (ref 3.87–5.11)
RDW: 15.9 % — AB (ref 11.5–15.5)
WBC: 7.2 10*3/uL (ref 4.0–10.5)

## 2018-02-24 LAB — BASIC METABOLIC PANEL
Anion gap: 10 (ref 5–15)
BUN: 51 mg/dL — AB (ref 6–20)
CALCIUM: 9.2 mg/dL (ref 8.9–10.3)
CHLORIDE: 101 mmol/L (ref 101–111)
CO2: 25 mmol/L (ref 22–32)
CREATININE: 2.37 mg/dL — AB (ref 0.44–1.00)
GFR calc non Af Amer: 18 mL/min — ABNORMAL LOW (ref >60)
GFR, EST AFRICAN AMERICAN: 21 mL/min — AB (ref >60)
GLUCOSE: 110 mg/dL — AB (ref 65–99)
Potassium: 4.8 mmol/L (ref 3.5–5.1)
Sodium: 136 mmol/L (ref 135–145)

## 2018-02-24 LAB — IRON AND TIBC
IRON: 122 ug/dL (ref 28–170)
Saturation Ratios: 46 % — ABNORMAL HIGH (ref 10.4–31.8)
TIBC: 266 ug/dL (ref 250–450)
UIBC: 144 ug/dL

## 2018-02-24 LAB — RETICULOCYTES
RBC.: 3.06 MIL/uL — ABNORMAL LOW (ref 3.87–5.11)
RETIC COUNT ABSOLUTE: 64.3 10*3/uL (ref 19.0–186.0)
RETIC CT PCT: 2.1 % (ref 0.4–3.1)

## 2018-02-24 LAB — MRSA PCR SCREENING: MRSA BY PCR: NEGATIVE

## 2018-02-24 LAB — FOLATE: FOLATE: 11.1 ng/mL (ref >5.9)

## 2018-02-24 LAB — PREPARE RBC (CROSSMATCH)

## 2018-02-24 LAB — FERRITIN: Ferritin: 25 ng/mL (ref 11–307)

## 2018-02-24 LAB — VITAMIN B12: VITAMIN B 12: 245 pg/mL (ref 180–914)

## 2018-02-24 MED ORDER — SODIUM CHLORIDE 0.9 % IV SOLN: 1.0000 g | Freq: Three times a day (TID) | INTRAVENOUS | Status: DC

## 2018-02-24 MED ORDER — ADULT MULTIVITAMIN W/MINERALS CH
1.0000 | ORAL_TABLET | Freq: Every day | ORAL | Status: DC
  Administered 2018-02-24 – 2018-03-18 (×20): 1 via ORAL
  Filled 2018-02-24 (×22): qty 1

## 2018-02-24 MED ORDER — SODIUM CHLORIDE 0.9 % IV SOLN
2.0000 g | Freq: Every day | INTRAVENOUS | Status: DC
  Administered 2018-02-24 – 2018-03-04 (×9): 2 g via INTRAVENOUS
  Filled 2018-02-24 (×6): qty 2
  Filled 2018-02-24: qty 20
  Filled 2018-02-24: qty 2
  Filled 2018-02-24 (×2): qty 20

## 2018-02-24 MED ORDER — HYDROMORPHONE HCL 1 MG/ML IJ SOLN
0.5000 mg | Freq: Once | INTRAMUSCULAR | Status: AC | PRN
  Administered 2018-02-24: 0.5 mg via INTRAVENOUS
  Filled 2018-02-24: qty 0.5

## 2018-02-24 MED ORDER — FAMOTIDINE 20 MG PO TABS
20.0000 mg | ORAL_TABLET | Freq: Every day | ORAL | Status: DC
  Administered 2018-02-24 – 2018-03-10 (×14): 20 mg via ORAL
  Filled 2018-02-24 (×14): qty 1

## 2018-02-24 NOTE — Consult Note (Signed)
Referring Provider:  S. Purohit ([facility] hospitalist) Primary Care Physician:  [name], MD Primary Gastroenterologist: None (unassigned)  Reason for Consultation: Heme positive stool and anemia  HPI: Shelby Reese is a 83 y.o. female who is in the hospital for ischemic ulceration of her left foot, status post right sided above-the-knee amputation for vascular disease about 3 months ago.  In the hospital, the patient has been noted to have heme positive stool (which she attributes to rectal irritation), and she has developed anemia to hemoglobin of 6.9 requiring transfusion of 2 units of blood, a scenario which has also occurred in January and February of this year, when her hemoglobin got down to 6.7 and 7.3, respectively.  Last October, the patient had a hemoglobin of 8 with an iron saturation of 6% and a ferritin of 27; on recheck on the current admission, while on oral iron supplementation, those numbers have improved to an iron saturation of 46% and a ferritin of 25, yet she still presented with severe anemia.  Of note, the patient has significant chronic renal insufficiency, with an estimated GFR of 19 (creatinine 2.37).  The patient was referred to our office several months ago by her primary physician because of her anemia, and records forwarded to [place] indicated that she had a colonoscopy in February, 2002 which showed a "tortuous" colon, apparently with no other significant findings noted  (No path reports present on chart review).  However, the patient never kept her appointment, and apparently has previously indicated to [place] that she never wanted another colonoscopy.  The patient is a somewhat oblique historian, when asked whether she has any active GI tract symptoms.  However, for what it is worth, it does not sound as though there are any significant localizing GI tract symptoms.  She is on chronic opioids and laxatives.  Past Medical History:  Diagnosis Date  .  Allergy   . Anemia   . CKD (chronic kidney disease), stage II   . Fibromyalgia   . GERD (gastroesophageal reflux disease)   . Hiatal hernia   . Hypertension   . Hypothyroid   . Lymphedema    venous insufficency  . Osteoarthritis   . Peripheral vascular disease ([place])   . Ulcer of knee ([place])    right  . Urinary incontinence   . Varicose veins   . Venous insufficiency     Past Surgical History:  Procedure Laterality Date  . AMPUTATION Right 12/15/2017   Procedure: AMPUTATION ABOVE KNEE, RIGHT;  Surgeon: Elam Dutch, MD;  Location: [facility];  Service: Vascular;  Laterality: Right;  . APPLICATION OF WOUND VAC Right 07/18/2017   Procedure: APPLICATION OF WOUND VAC;  Surgeon: [name], MD;  Location: [place];  Service: Orthopedics;  Laterality: Right;  . CATARACT EXTRACTION W/ INTRAOCULAR LENS  IMPLANT, BILATERAL    . COLONOSCOPY    . I&D EXTREMITY Right 07/18/2017   Procedure: IRRIGATION AND DEBRIDEMENT RIGHT KNEE;  Surgeon: [name], MD;  Location: [place];  Service: Orthopedics;  Laterality: Right;  . I&D EXTREMITY Right 07/23/2017   Procedure: REPEAT IRRIGATION AND DEBRIDEMENT RIGHT KNEE;  Surgeon: [name], MD;  Location: [facility];  Service: Orthopedics;  Laterality: Right;  . I&D EXTREMITY Right 11/28/2017   Procedure: IRRIGATION AND DEBRIDEMENT RIGHT KNEE , APPLY INSTILLATION VAC;  Surgeon: [name], MD;  Location: [place];  Service: Orthopedics;  Laterality: Right;  . I&D EXTREMITY Right 12/03/2017   Procedure: REPEAT  IRRIGATION AND DEBRIDEMENT RIGHT KNEE AND APPLICATION OF A WOUND VAC.;  Surgeon: [name], MD;  Location: [place];  Service: Orthopedics;  Laterality: Right;  . KNEE ARTHROSCOPY Left    menisectomy  . MULTIPLE TOOTH EXTRACTIONS    . SKIN SPLIT GRAFT Right 07/25/2017   Procedure: Repeat Irrigation and Debridement Right Knee, Split Thickness Skin Graft;  Surgeon: [name], MD;  Location: [place];  Service: Orthopedics;  Laterality: Right;  . SKIN  SPLIT GRAFT Right 12/05/2017   Procedure: SKIN GRAFT SPLIT THICKNESS WOUND KNEE, APPLY VAC;  Surgeon: [name], MD;  Location: [facility] [facility];  Service: Orthopedics;  Laterality: Right;  . TONSILLECTOMY      Prior to Admission medications   Medication Sig Start Date End Date Taking? Authorizing Provider  amitriptyline (ELAVIL) 50 MG tablet Take 50 mg by mouth at bedtime.   Yes [provider]  aspirin EC 81 MG tablet Take 81 mg by mouth daily with breakfast.   Yes [provider]  benazepril (LOTENSIN) 40 MG tablet Take 40 mg by mouth daily.   Yes [provider]  Calcium Carbonate-Vitamin D (CALCIUM 600+D) 600-400 MG-UNIT per tablet Take 1 tablet by mouth daily at 3 pm.    Yes [provider]  cetirizine (ZYRTEC) 10 MG tablet Take 10 mg by mouth at bedtime.    Yes [provider]  diltiazem (TIAZAC) 120 MG 24 hr capsule Take 120 mg by mouth daily with breakfast.  01/29/13  Yes [provider]  docusate sodium (COLACE) 100 MG capsule Take 1 capsule (100 mg total) by mouth daily. 12/24/17  Yes Bonnell Public, MD  doxycycline (VIBRA-TABS) 100 MG tablet Take 100 mg by mouth 2 (two) times daily. For 10 days 02/19/18  Yes [provider]  escitalopram (LEXAPRO) 10 MG tablet Take 10 mg by mouth daily.   Yes [provider]  famotidine (PEPCID) 40 MG tablet Take 40 mg by mouth daily at 3 pm. 1600   Yes [provider]  feeding supplement, ENSURE ENLIVE, (ENSURE ENLIVE) LIQD Take 237 mLs by mouth 3 (three) times daily with meals. 12/23/17  Yes Dana Allan I, MD  fentaNYL (DURAGESIC - DOSED MCG/HR) 25 MCG/HR patch Place 25 mcg onto the skin every 3 (three) days.   Yes [provider]  ferrous sulfate 325 (65 FE) MG tablet Take 325 mg by mouth 2 (two) times daily with a meal.   Yes [provider]  furosemide (LASIX) 20 MG tablet Take 20 mg by mouth daily.    Yes [provider]  hydrALAZINE  (APRESOLINE) 50 MG tablet Take 50 mg by mouth 4 (four) times daily.   Yes [provider]  methocarbamol (ROBAXIN) 500 MG tablet Take 500 mg by mouth every 6 (six) hours as needed for muscle spasms.   Yes [provider]  Multiple Vitamins-Minerals (MULTIVITAMIN WITH MINERALS) tablet Take 1 tablet by mouth daily.   Yes [provider]  oxyCODONE (OXY IR/ROXICODONE) 5 MG immediate release tablet Take 5 mg by mouth every 4 (four) hours. Every 30 mins before therapy    Yes [provider]  pantoprazole (PROTONIX) 40 MG tablet Take 1 tablet (40 mg total) by mouth daily. 12/24/17  Yes Dana Allan I, MD  polyethylene glycol (MIRALAX / GLYCOLAX) packet Take 17 g by mouth 2 (two) times daily. 12/23/17  Yes Bonnell Public, MD  senna-docusate (SENNA-PLUS) 8.6-50 MG tablet Take 2 tablets by mouth 2 (  two) times daily.   Yes [provider]  SYNTHROID 175 MCG tablet Take 175 mcg by mouth daily before breakfast.  09/20/15  Yes [provider]  vitamin C (ASCORBIC ACID) 500 MG tablet Take 500 mg by mouth daily.   Yes [provider]    Current Facility-Administered Medications  Medication Dose Route Frequency Provider Last Rate Last Dose  . 0.9 %  sodium chloride infusion   Intravenous Continuous Bodenheimer, Charles A, NP 75 mL/hr at 02/24/18 1405    . 0.9 %  sodium chloride infusion   Intravenous Once Merton Border, MD      . amitriptyline (ELAVIL) tablet 50 mg  50 mg Oral QHS Merton Border, MD   50 mg at 02/23/18 2124  . aspirin EC tablet 81 mg  81 mg Oral Q breakfast Merton Border, MD   81 mg at 02/24/18 0900  . calcium-vitamin D (OSCAL WITH D) 500-200 MG-UNIT per tablet 1 tablet  1 tablet Oral Q breakfast Merton Border, MD   1 tablet at 02/24/18 0900  . cefTRIAXone (ROCEPHIN) 2 g in sodium chloride 0.9 % 100 mL IVPB  2 g Intravenous Daily Purohit, Konrad Dolores, MD   Stopped at 02/24/18 1150  . clindamycin (CLEOCIN) IVPB 600 mg  600 mg Intravenous Q8H  Merton Border, MD   Stopped at 02/24/18 1654  . diltiazem (TIAZAC) 24 hr capsule 120 mg  120 mg Oral Q breakfast Merton Border, MD   120 mg at 02/24/18 0900  . docusate sodium (COLACE) capsule 100 mg  100 mg Oral Daily Merton Border, MD   100 mg at 02/24/18 0905  . escitalopram (LEXAPRO) tablet 10 mg  10 mg Oral Daily Merton Border, MD   10 mg at 02/24/18 0906  . famotidine (PEPCID) tablet 20 mg  20 mg Oral Q1500 Purohit, Shrey C, MD   20 mg at 02/24/18 1405  . feeding supplement (ENSURE ENLIVE) (ENSURE ENLIVE) liquid 237 mL  237 mL Oral TID WC Merton Border, MD   237 mL at 02/24/18 0900  . fentaNYL (DURAGESIC - dosed mcg/hr) patch 25 mcg  25 mcg Transdermal Q72H Merton Border, MD   25 mcg at 02/23/18 2133  . ferrous sulfate tablet 325 mg  325 mg Oral BID WC Merton Border, MD   325 mg at 02/24/18 1719  . heparin injection 5,000 Units  5,000 Units Subcutaneous Q8H Merton Border, MD   5,000 Units at 02/24/18 1405  . hydrALAZINE (APRESOLINE) tablet 50 mg  50 mg Oral QID Merton Border, MD   50 mg at 02/24/18 1719  . levothyroxine (SYNTHROID, LEVOTHROID) tablet 175 mcg  175 mcg Oral QAC breakfast Merton Border, MD   175 mcg at 02/24/18 0900  . loratadine (CLARITIN) tablet 10 mg  10 mg Oral Daily Merton Border, MD   10 mg at 02/24/18 0906  . methocarbamol (ROBAXIN) tablet 500 mg  500 mg Oral Q6H PRN Merton Border, MD      . multivitamin with minerals tablet 1 tablet  1 tablet Oral Daily Merton Border, MD   1 tablet at 02/24/18 0907  . ondansetron (ZOFRAN) tablet 4 mg  4 mg Oral Q6H PRN Merton Border, MD       Or  . ondansetron (ZOFRAN) injection 4 mg  4 mg Intravenous Q6H PRN Merton Border, MD      . oxyCODONE (Oxy IR/ROXICODONE) immediate release tablet 5 mg  5 mg Oral Q4H Merton Border, MD   5 mg at 02/24/18 1719  .  oxyCODONE-acetaminophen (PERCOCET/ROXICET) 5-325 MG per tablet 1 tablet  1 tablet Oral Q6H PRN Merton Border, MD      . pantoprazole (PROTONIX) EC tablet 40 mg  40 mg Oral Daily Merton Border, MD   40 mg at 02/24/18 0907  .  polyethylene glycol (MIRALAX / GLYCOLAX) packet 17 g  17 g Oral BID Merton Border, MD   17 g at 02/24/18 0911  . senna-docusate (Senokot-S) tablet 2 tablet  2 tablet Oral BID Merton Border, MD   2 tablet at 02/24/18 0905  . vitamin C (ASCORBIC ACID) tablet 500 mg  500 mg Oral Daily Merton Border, MD   500 mg at 02/24/18 0906  . zolpidem (AMBIEN) tablet 5 mg  5 mg Oral QHS PRN Merton Border, MD        Allergies as of 02/23/2018 - Review Complete 02/16/2018  Allergen Reaction Noted  . Cinnamon Hives 12/02/2017  . Ciprofloxacin Other (See Comments) 04/15/2013  . Diovan [valsartan] Other (See Comments) 04/15/2013  . Food Diarrhea and Other (See Comments) 07/15/2017  . Latex Rash and Other (See Comments) 07/11/2017  . Nitrofuran derivatives Hives and Rash 04/15/2013  . Penicillins Hives and Swelling 04/15/2013  . Bactrim [sulfamethoxazole-trimethoprim] Diarrhea and Nausea Only 04/15/2013  . Other Rash 06/12/2016  . Sulfa antibiotics Diarrhea and Nausea Only 07/11/2017    Family History  Problem Relation Age of Onset  . Stroke Mother   . Varicose Veins Mother   . Cancer Father        prostate  . Stroke Sister   . Heart disease Sister   . Varicose Veins Sister   . Stroke Maternal Grandmother   . Varicose Veins Sister     Social History   Socioeconomic History  . Marital status: Married    Spouse name: Not on file  . Number of children: 0  . Years of education: Not on file  . Highest education level: Not on file  Occupational History    Comment: retired Marine scientist.   Social Needs  . Financial resource strain: Not on file  . Food insecurity:    Worry: Not on file    Inability: Not on file  . Transportation needs:    Medical: Not on file    Non-medical: Not on file  Tobacco Use  . Smoking status: Former Smoker    Packs/day: 0.50    Years: 10.00    Pack years: 5.00    Last attempt to quit: 11/19/1979    Years since quitting: 38.2  . Smokeless tobacco: Never Used  Substance and Sexual  Activity  . Alcohol use: Yes    Alcohol/week: 8.4 oz    Types: 14 Glasses of wine per week    Comment: 2 glasses of wine with dinner  . Drug use: No  . Sexual activity: Not on file  Lifestyle  . Physical activity:    Days per week: Not on file    Minutes per session: Not on file  . Stress: Not on file  Relationships  . Social connections:    Talks on phone: Not on file    Gets together: Not on file    Attends religious service: Not on file    Active member of club or organization: Not on file    Attends meetings of clubs or organizations: Not on file    Relationship status: Not on file  . Intimate partner violence:    Fear of current or ex partner: Not on file  Emotionally abused: Not on file    Physically abused: Not on file    Forced sexual activity: Not on file  Other Topics Concern  . Not on file  Social History Narrative  . Not on file    Review of Systems: See HPI  Physical Exam: Vital signs in last 24 hours: Temp:  [97.8 F (36.6 C)-98.3 F (36.8 C)] 98.3 F (36.8 C) (04/09 1350) Pulse Rate:  [74-90] 81 (04/09 1350) Resp:  [10-19] 18 (04/09 1350) BP: (84-150)/(35-94) 120/44 (04/09 1350) SpO2:  [89 %-100 %] 96 % (04/09 1350) Weight:  [81.3 kg (179 lb 3.7 oz)-81.9 kg (180 lb 8.9 oz)] 81.9 kg (180 lb 8.9 oz) (04/09 0600) Last BM Date: (Patient not sure) General:  Overweight Caucasian female, looking perhaps somewhat younger than her age, sitting in bed, alert and coherent but somewhat "feisty" and curmudgeonly. Head:  Normocephalic and atraumatic. Eyes:  Sclera clear, no icterus.   Conjunctiva pink. Neck:   No masses or thyromegaly. Lungs:  Clear throughout to auscultation.   No wheezes, crackles, or rhonchi. No evident respiratory distress. Heart:   Regular rate and rhythm; prominent systolic murmur suggestive of aortic valvular disease. Abdomen: Obese but without overt tenderness Msk: Status post right AKA; left foot has chronic ischemic changes and some  deep ulcerations Neurologic:  Alert but with intermittently somewhat tangential thoughts.  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 Cervical Nodes:  No significant cervical adenopathy. Psych:   Alert and cooperative.Somewhat "feisty" as described above.  Intake/Output from previous day: 04/08 0701 - 04/09 0700 In: 1652.5 [P.O.:240; I.V.:307.5; Blood:805; IV Piggyback:300] Out: 400 [Urine:400] Intake/Output this shift: Total I/O In: 500 [I.V.:250; IV Piggyback:250] Out: 300 [Urine:300]  Lab Results: Recent Labs    02/23/18 1518 02/24/18 0333  WBC 9.9 7.2  HGB 6.9* 8.2*  HCT 22.8* 26.7*  PLT 435* 338   BMET Recent Labs    02/23/18 1518 02/24/18 0333  NA 133* 136  K 4.7 4.8  CL 99* 101  CO2 24 25  GLUCOSE 109* 110*  BUN 48* 51*  CREATININE 2.30* 2.37*  CALCIUM 9.7 9.2   LFT Recent Labs    02/23/18 1518  PROT 7.3  ALBUMIN 3.2*  AST 21  ALT 14  ALKPHOS 56  BILITOT 0.4   PT/INR No results for input(s): LABPROT, INR in the last 72 hours.  Studies/Results: Dg Foot Complete Left  Result Date: 02/23/2018 CLINICAL DATA:  Left foot redness, pain and swelling. Question osteomyelitis. EXAM: LEFT FOOT - COMPLETE 3+ VIEW COMPARISON:  None. FINDINGS: There appear to be skin wounds over the dorsum of the foot centered at the level of the cuneiforms, neck of the talus and tibiotalar joint. Lucencies are identified in the bones the midfoot, most conspicuous in the cuboid and lateral cuneiform as well as the fourth metatarsal. No fracture. Atherosclerosis noted. No soft tissue gas or radiopaque foreign body. IMPRESSION: Lucencies in the bones of the midfoot are nonspecific and could be due to early neuropathic change or osteomyelitis. Skin wounds on the dorsum of the foot. Electronically Signed   By: Inge Rise M.D.   On:  02/23/2018 15:55    Impression: 1.  Recurring anemia with possible element of iron deficiency, but the absence of correction of the anemia despite correction of her iron status makes me think that "chronic  disease" is probably the more likely explanation. 2.  Heme positive stool, of uncertain clinical significance.  Last colonoscopy was about 17 years ago and was apparently negative at that time.  3.  Serious medical comorbidities, most particularly her peripheral vascular disease and active ischemic process in the left foot.  Plan: Per the patient's preference, we will remain on standby rather than performing GI tract evaluation.The patient makes it clear, in no uncertain terms, that she does not want evaluation for her anemia or heme positivity.  I offered the options of either endoscopic or radiographic evaluation, neither of which she was agreeable to. Considering everything that is going on, this may well be a very reasonable and prudent course of action, since her anemia can be treated by transfusions, and since the likelihood of finding correctable disease in the GI tract is fairly low.  Accordingly, we will sign off, but please contact us again if the patient changes her mind or we can somehow be of further assistance in this patient's care.   LOS: 1 day   Paoli V  02/24/2018, 5:46 PM   Pager [phone] If no answer or after 5 PM call [phone]

## 2018-02-24 NOTE — Plan of Care (Signed)
  Problem: Nutrition: Goal: Adequate nutrition will be maintained Outcome: Progressing   Problem: Pain Managment: Goal: General experience of comfort will improve Outcome: Progressing   Problem: Elimination: Goal: Will not experience complications related to bowel motility Outcome: Progressing   

## 2018-02-24 NOTE — H&P (View-Only) (Signed)
[facility] [place]  Patient name: Shelby Reese MRN: [id number] DOB: June 27, 1935 Sex: female   REQUESTING PROVIDER:   Hospital service   REASON FOR CONSULT:    Chronic left foot ulcer  HISTORY OF PRESENT ILLNESS:   Shelby Reese is a 83 y.o. female, who is admitted with wound infection to the left foot.  She has recently undergone right above-knee amputation.  She notes a worsening appearance of her left foot with worsening pain.  She is currently been treated with antibiotics.  She does suffer from renal insufficiency.  Her creatinine is now elevated to 2.37.  She is medically managed for hypertension.  She also suffers from chronic venous insufficiency.  She is previously seen Dr. Oneida Alar in the office who had recommended left leg amputation however the patient refused.  PAST MEDICAL HISTORY    Past Medical History:  Diagnosis Date  . Allergy   . Anemia   . CKD (chronic kidney disease), stage II   . Fibromyalgia   . GERD (gastroesophageal reflux disease)   . Hiatal hernia   . Hypertension   . Hypothyroid   . Lymphedema    venous insufficency  . Osteoarthritis   . Peripheral vascular disease ([place])   . Ulcer of knee ([place])    right  . Urinary incontinence   . Varicose veins   . Venous insufficiency      FAMILY HISTORY   Family History  Problem Relation Age of Onset  . Stroke Mother   . Varicose Veins Mother   . Cancer Father        prostate  . Stroke Sister   . Heart disease Sister   . Varicose Veins Sister   . Stroke Maternal Grandmother   . Varicose Veins Sister     SOCIAL HISTORY:   Social History   Socioeconomic History  . Marital status: Married    Spouse name: Not on file  . Number of children: 0  . Years of education: Not on file  . Highest education level: Not on file  Occupational History    Comment: retired Marine scientist.   Social Needs  . Financial resource strain: Not on file  . Food  insecurity:    Worry: Not on file    Inability: Not on file  . Transportation needs:    Medical: Not on file    Non-medical: Not on file  Tobacco Use  . Smoking status: Former Smoker    Packs/day: 0.50    Years: 10.00    Pack years: 5.00    Last attempt to quit: 11/19/1979    Years since quitting: 38.2  . Smokeless tobacco: Never Used  Substance and Sexual Activity  . Alcohol use: Yes    Alcohol/week: 8.4 oz    Types: 14 Glasses of wine per week    Comment: 2 glasses of wine with dinner  . Drug use: No  . Sexual activity: Not on file  Lifestyle  . Physical activity:    Days per week: Not on file    Minutes per session: Not on file  . Stress: Not on file  Relationships  . Social connections:    Talks on phone: Not on file    Gets together: Not on file    Attends religious service: Not on file    Active member of club or organization: Not on file    Attends meetings of clubs or organizations: Not on file    Relationship status:  Not on file  . Intimate partner violence:    Fear of current or ex partner: Not on file    Emotionally abused: Not on file    Physically abused: Not on file    Forced sexual activity: Not on file  Other Topics Concern  . Not on file  Social History Narrative  . Not on file    ALLERGIES:    Allergies  Allergen Reactions  . Cinnamon Hives  . Ciprofloxacin Other (See Comments)    TREMORS  . Diovan [Valsartan] Other (See Comments)    Extreme vertigo  . Food Diarrhea and Other (See Comments)    ORANGE JUICE   UPSET STOMACH  . Latex Rash and Other (See Comments)    Rash/inflammation due to exposure  . Nitrofuran Derivatives Hives and Rash    "Full body rash"  . Penicillins Hives and Swelling    *tolerated Ceftriaxone September 2018 Has patient had a PCN reaction causing immediate rash, facial/tongue/throat swelling, SOB or lightheadedness with hypotension:No--severe irritation at the injection site Has patient had a PCN reaction causing  severe rash involving mucus membranes or skin necrosis:Unknown Has patient had a PCN reaction that required hospitalization:No Has patient had a PCN reaction occurring within the last 10 years:Yes If all of the above answers are "NO", then may proceed with  . Bactrim [Sulfamethoxazole-Trimethoprim] Diarrhea and Nausea Only  . Other Rash    Mycins, Strawberry, Oranges  . Sulfa Antibiotics Diarrhea and Nausea Only    CURRENT MEDICATIONS:    Current Facility-Administered Medications  Medication Dose Route Frequency Provider Last Rate Last Dose  . 0.9 %  sodium chloride infusion   Intravenous Continuous Bodenheimer, Charles A, NP 75 mL/hr at 02/24/18 1405    . 0.9 %  sodium chloride infusion   Intravenous Once Merton Border, MD      . amitriptyline (ELAVIL) tablet 50 mg  50 mg Oral QHS Merton Border, MD   50 mg at 02/23/18 2124  . aspirin EC tablet 81 mg  81 mg Oral Q breakfast Merton Border, MD   81 mg at 02/24/18 0900  . calcium-vitamin D (OSCAL WITH D) 500-200 MG-UNIT per tablet 1 tablet  1 tablet Oral Q breakfast Merton Border, MD   1 tablet at 02/24/18 0900  . cefTRIAXone (ROCEPHIN) 2 g in sodium chloride 0.9 % 100 mL IVPB  2 g Intravenous Daily Purohit, Konrad Dolores, MD   Stopped at 02/24/18 1150  . clindamycin (CLEOCIN) IVPB 600 mg  600 mg Intravenous Q8H Merton Border, MD   Stopped at 02/24/18 1654  . diltiazem (CARDIZEM CD) 24 hr capsule 120 mg  120 mg Oral Q breakfast Merton Border, MD   120 mg at 02/24/18 0900  . docusate sodium (COLACE) capsule 100 mg  100 mg Oral Daily Merton Border, MD   100 mg at 02/24/18 0905  . escitalopram (LEXAPRO) tablet 10 mg  10 mg Oral Daily Merton Border, MD   10 mg at 02/24/18 0906  . famotidine (PEPCID) tablet 20 mg  20 mg Oral Q1500 Purohit, Shrey C, MD   20 mg at 02/24/18 1405  . feeding supplement (ENSURE ENLIVE) (ENSURE ENLIVE) liquid 237 mL  237 mL Oral TID WC Merton Border, MD   237 mL at 02/24/18 0900  . fentaNYL (DURAGESIC - dosed mcg/hr) patch 25 mcg  25 mcg  Transdermal Q72H Merton Border, MD   25 mcg at 02/23/18 2133  . ferrous sulfate tablet 325 mg  325 mg Oral BID  WC Merton Border, MD   325 mg at 02/24/18 1719  . heparin injection 5,000 Units  5,000 Units Subcutaneous Q8H Merton Border, MD   5,000 Units at 02/24/18 1405  . hydrALAZINE (APRESOLINE) tablet 50 mg  50 mg Oral QID Merton Border, MD   50 mg at 02/24/18 1719  . levothyroxine (SYNTHROID, LEVOTHROID) tablet 175 mcg  175 mcg Oral QAC breakfast Merton Border, MD   175 mcg at 02/24/18 0900  . loratadine (CLARITIN) tablet 10 mg  10 mg Oral Daily Merton Border, MD   10 mg at 02/24/18 0906  . methocarbamol (ROBAXIN) tablet 500 mg  500 mg Oral Q6H PRN Merton Border, MD      . multivitamin with minerals tablet 1 tablet  1 tablet Oral Daily Merton Border, MD   1 tablet at 02/24/18 0907  . ondansetron (ZOFRAN) tablet 4 mg  4 mg Oral Q6H PRN Merton Border, MD       Or  . ondansetron (ZOFRAN) injection 4 mg  4 mg Intravenous Q6H PRN Merton Border, MD      . oxyCODONE (Oxy IR/ROXICODONE) immediate release tablet 5 mg  5 mg Oral Q4H Merton Border, MD   5 mg at 02/24/18 1719  . oxyCODONE-acetaminophen (PERCOCET/ROXICET) 5-325 MG per tablet 1 tablet  1 tablet Oral Q6H PRN Merton Border, MD      . pantoprazole (PROTONIX) EC tablet 40 mg  40 mg Oral Daily Merton Border, MD   40 mg at 02/24/18 0907  . polyethylene glycol (MIRALAX / GLYCOLAX) packet 17 g  17 g Oral BID Merton Border, MD   17 g at 02/24/18 0911  . senna-docusate (Senokot-S) tablet 2 tablet  2 tablet Oral BID Merton Border, MD   2 tablet at 02/24/18 0905  . vitamin C (ASCORBIC ACID) tablet 500 mg  500 mg Oral Daily Merton Border, MD   500 mg at 02/24/18 9675  . zolpidem (AMBIEN) tablet 5 mg  5 mg Oral QHS PRN Merton Border, MD        REVIEW OF SYSTEMS:   [X]  denotes positive finding, [ ]  denotes negative finding Cardiac  Comments:  Chest pain or chest pressure:    Shortness of breath upon exertion:    Short of breath when lying flat:    Irregular heart rhythm:          Vascular    Pain in calf, thigh, or hip brought on by ambulation:    Pain in feet at night that wakes you up from your sleep:     Blood clot in your veins:    Leg swelling:         Pulmonary    Oxygen at home:    Productive cough:     Wheezing:         Neurologic    Sudden weakness in arms or legs:     Sudden numbness in arms or legs:     Sudden onset of difficulty speaking or slurred speech:    Temporary loss of vision in one eye:     Problems with dizziness:         Gastrointestinal    Blood in stool:      Vomited blood:         Genitourinary    Burning when urinating:     Blood in urine:        Psychiatric    Major depression:         Hematologic  Bleeding problems:    Problems with blood clotting too easily:        Skin    Rashes or ulcers: x       Constitutional    Fever or chills:     PHYSICAL EXAM:   Vitals:   02/24/18 0800 02/24/18 1000 02/24/18 1217 02/24/18 1350  BP: (!) 150/53 (!) 128/46  (!) 120/44  Pulse: 81 80  81  Resp: 11 19  18   Temp:   98.2 F (36.8 C) 98.3 F (36.8 C)  TempSrc:   Oral Oral  SpO2: 91% 91%  96%  Weight:      Height:        GENERAL: The patient is a well-nourished female, in no acute distress. The vital signs are documented above. CARDIAC: There is a regular rate and rhythm.  VASCULAR: Palpable femoral pulses bilaterally PULMONARY: Nonlabored respirations ABDOMEN: Soft and non-tender with normal pitched bowel sounds.  MUSCULOSKELETAL: Right leg amputation has healed NEUROLOGIC: No focal weakness or paresthesias are detected. SKIN: Large ulceration of the dorsum of the left foot.  No significant surrounding erythema. PSYCHIATRIC: The patient has a normal affect.  STUDIES:   I have reviewed her previous vascular lab studies.  ABIs are unreliable due to the patient not being able to tolerate the exam.  She did have stenosis within the left common femoral artery and plaque throughout the superficial femoral  artery.  ASSESSMENT and PLAN   Left foot ulcer: I discussed with the patient that this is a limb threatening situation.  She most likely will require amputation however she is refusing any amputation at this time.  She would like to proceed with anything possible to try to save her leg.  In order to better evaluate this, she will need angiography.  However, she is suffering from acute renal insufficiency.  She will need to be hydrated and see if her renal function improves.  If it does, I would consider angiography.  If this is to be done, it will need to be done at [facility].  She would need to be transferred.  The earliest available date for angiography would be Friday, however I am not sure her renal function will be satisfactory at that time, and therefore this may need to be delayed until next week.   Annamarie Major, MD [facility] [phone] Pager [phone]

## 2018-02-24 NOTE — Progress Notes (Signed)
Pt brought down to MRI for second attempt at MRI of left ankle. Pain meds given prior to attempting scan. Pt is in too much pain. Unable to even move pt over to MRI table. Pt screams in pain when attempting to place Pt's foot in foot and ankle coil. Pt is in too much pain. Pt refused.

## 2018-02-24 NOTE — Progress Notes (Signed)
VASCULAR LAB PRELIMINARY  ARTERIAL  ABI completed: Left ABI not be able to obtain due to patient's intolerance of pain from BP cuff.      RIGHT    LEFT    PRESSURE WAVEFORM  PRESSURE WAVEFORM  BRACHIAL 92  BRACHIAL 95   DP   DP  monophasic  AT   AT    PT   PT  monophasic  GREAT TOE  NA GREAT TOE  Absent    RIGHT LEFT  ABI AKA Not obtainable     HONGYING  Sade Hollon, RVT 02/24/2018, 12:35 PM

## 2018-02-24 NOTE — Consult Note (Signed)
[facility] [place]  Patient name: Shelby Reese MRN: [id number] DOB: 05-13-1935 Sex: female   REQUESTING PROVIDER:   Hospital service   REASON FOR CONSULT:    Chronic left foot ulcer  HISTORY OF PRESENT ILLNESS:   Shelby Reese is a 83 y.o. female, who is admitted with wound infection to the left foot.  She has recently undergone right above-knee amputation.  She notes a worsening appearance of her left foot with worsening pain.  She is currently been treated with antibiotics.  She does suffer from renal insufficiency.  Her creatinine is now elevated to 2.37.  She is medically managed for hypertension.  She also suffers from chronic venous insufficiency.  She is previously seen Dr. Oneida Alar in the office who had recommended left leg amputation however the patient refused.  PAST MEDICAL HISTORY    Past Medical History:  Diagnosis Date  . Allergy   . Anemia   . CKD (chronic kidney disease), stage II   . Fibromyalgia   . GERD (gastroesophageal reflux disease)   . Hiatal hernia   . Hypertension   . Hypothyroid   . Lymphedema    venous insufficency  . Osteoarthritis   . Peripheral vascular disease ([place] Reese)   . Ulcer of knee ([place])    right  . Urinary incontinence   . Varicose veins   . Venous insufficiency      FAMILY HISTORY   Family History  Problem Relation Age of Onset  . Stroke Mother   . Varicose Veins Mother   . Cancer Father        prostate  . Stroke Sister   . Heart disease Sister   . Varicose Veins Sister   . Stroke Maternal Grandmother   . Varicose Veins Sister     SOCIAL HISTORY:   Social History   Socioeconomic History  . Marital status: Married    Spouse name: Not on file  . Number of children: 0  . Years of education: Not on file  . Highest education level: Not on file  Occupational History    Comment: retired Marine scientist.   Social Needs  . Financial resource strain: Not on file  . Food  insecurity:    Worry: Not on file    Inability: Not on file  . Transportation needs:    Medical: Not on file    Non-medical: Not on file  Tobacco Use  . Smoking status: Former Smoker    Packs/day: 0.50    Years: 10.00    Pack years: 5.00    Last attempt to quit: 11/19/1979    Years since quitting: 38.2  . Smokeless tobacco: Never Used  Substance and Sexual Activity  . Alcohol use: Yes    Alcohol/week: 8.4 oz    Types: 14 Glasses of wine per week    Comment: 2 glasses of wine with dinner  . Drug use: No  . Sexual activity: Not on file  Lifestyle  . Physical activity:    Days per week: Not on file    Minutes per session: Not on file  . Stress: Not on file  Relationships  . Social connections:    Talks on phone: Not on file    Gets together: Not on file    Attends religious service: Not on file    Active member of club or organization: Not on file    Attends meetings of clubs or organizations: Not on file    Relationship status:  Not on file  . Intimate partner violence:    Fear of current or ex partner: Not on file    Emotionally abused: Not on file    Physically abused: Not on file    Forced sexual activity: Not on file  Other Topics Concern  . Not on file  Social History Narrative  . Not on file    ALLERGIES:    Allergies  Allergen Reactions  . Cinnamon Hives  . Ciprofloxacin Other (See Comments)    TREMORS  . Diovan [Valsartan] Other (See Comments)    Extreme vertigo  . Food Diarrhea and Other (See Comments)    ORANGE JUICE   UPSET STOMACH  . Latex Rash and Other (See Comments)    Rash/inflammation due to exposure  . Nitrofuran Derivatives Hives and Rash    "Full body rash"  . Penicillins Hives and Swelling    *tolerated Ceftriaxone September 2018 Has patient had a PCN reaction causing immediate rash, facial/tongue/throat swelling, SOB or lightheadedness with hypotension:No--severe irritation at the injection site Has patient had a PCN reaction causing  severe rash involving mucus membranes or skin necrosis:Unknown Has patient had a PCN reaction that required hospitalization:No Has patient had a PCN reaction occurring within the last 10 years:Yes If all of the above answers are "NO", then may proceed with  . Bactrim [Sulfamethoxazole-Trimethoprim] Diarrhea and Nausea Only  . Other Rash    Mycins, Strawberry, Oranges  . Sulfa Antibiotics Diarrhea and Nausea Only    CURRENT MEDICATIONS:    Current Facility-Administered Medications  Medication Dose Route Frequency Provider Last Rate Last Dose  . 0.9 %  sodium chloride infusion   Intravenous Continuous Bodenheimer, Charles A, NP 75 mL/hr at 02/24/18 1405    . 0.9 %  sodium chloride infusion   Intravenous Once Merton Border, MD      . amitriptyline (ELAVIL) tablet 50 mg  50 mg Oral QHS Merton Border, MD   50 mg at 02/23/18 2124  . aspirin EC tablet 81 mg  81 mg Oral Q breakfast Merton Border, MD   81 mg at 02/24/18 0900  . calcium-vitamin D (OSCAL WITH D) 500-200 MG-UNIT per tablet 1 tablet  1 tablet Oral Q breakfast Merton Border, MD   1 tablet at 02/24/18 0900  . cefTRIAXone (ROCEPHIN) 2 g in sodium chloride 0.9 % 100 mL IVPB  2 g Intravenous Daily Purohit, Konrad Dolores, MD   Stopped at 02/24/18 1150  . clindamycin (CLEOCIN) IVPB 600 mg  600 mg Intravenous Q8H Merton Border, MD   Stopped at 02/24/18 1654  . diltiazem (CARDIZEM CD) 24 hr capsule 120 mg  120 mg Oral Q breakfast Merton Border, MD   120 mg at 02/24/18 0900  . docusate sodium (COLACE) capsule 100 mg  100 mg Oral Daily Merton Border, MD   100 mg at 02/24/18 0905  . escitalopram (LEXAPRO) tablet 10 mg  10 mg Oral Daily Merton Border, MD   10 mg at 02/24/18 0906  . famotidine (PEPCID) tablet 20 mg  20 mg Oral Q1500 Purohit, Shrey C, MD   20 mg at 02/24/18 1405  . feeding supplement (ENSURE ENLIVE) (ENSURE ENLIVE) liquid 237 mL  237 mL Oral TID WC Merton Border, MD   237 mL at 02/24/18 0900  . fentaNYL (DURAGESIC - dosed mcg/hr) patch 25 mcg  25 mcg  Transdermal Q72H Merton Border, MD   25 mcg at 02/23/18 2133  . ferrous sulfate tablet 325 mg  325 mg Oral BID  WC Merton Border, MD   325 mg at 02/24/18 1719  . heparin injection 5,000 Units  5,000 Units Subcutaneous Q8H Merton Border, MD   5,000 Units at 02/24/18 1405  . hydrALAZINE (APRESOLINE) tablet 50 mg  50 mg Oral QID Merton Border, MD   50 mg at 02/24/18 1719  . levothyroxine (SYNTHROID, LEVOTHROID) tablet 175 mcg  175 mcg Oral QAC breakfast Merton Border, MD   175 mcg at 02/24/18 0900  . loratadine (CLARITIN) tablet 10 mg  10 mg Oral Daily Merton Border, MD   10 mg at 02/24/18 0906  . methocarbamol (ROBAXIN) tablet 500 mg  500 mg Oral Q6H PRN Merton Border, MD      . multivitamin with minerals tablet 1 tablet  1 tablet Oral Daily Merton Border, MD   1 tablet at 02/24/18 0907  . ondansetron (ZOFRAN) tablet 4 mg  4 mg Oral Q6H PRN Merton Border, MD       Or  . ondansetron (ZOFRAN) injection 4 mg  4 mg Intravenous Q6H PRN Merton Border, MD      . oxyCODONE (Oxy IR/ROXICODONE) immediate release tablet 5 mg  5 mg Oral Q4H Merton Border, MD   5 mg at 02/24/18 1719  . oxyCODONE-acetaminophen (PERCOCET/ROXICET) 5-325 MG per tablet 1 tablet  1 tablet Oral Q6H PRN Merton Border, MD      . pantoprazole (PROTONIX) EC tablet 40 mg  40 mg Oral Daily Merton Border, MD   40 mg at 02/24/18 0907  . polyethylene glycol (MIRALAX / GLYCOLAX) packet 17 g  17 g Oral BID Merton Border, MD   17 g at 02/24/18 0911  . senna-docusate (Senokot-S) tablet 2 tablet  2 tablet Oral BID Merton Border, MD   2 tablet at 02/24/18 0905  . vitamin C (ASCORBIC ACID) tablet 500 mg  500 mg Oral Daily Merton Border, MD   500 mg at 02/24/18 4431  . zolpidem (AMBIEN) tablet 5 mg  5 mg Oral QHS PRN Merton Border, MD        REVIEW OF SYSTEMS:   [X]  denotes positive finding, [ ]  denotes negative finding Cardiac  Comments:  Chest pain or chest pressure:    Shortness of breath upon exertion:    Short of breath when lying flat:    Irregular heart rhythm:          Vascular    Pain in calf, thigh, or hip brought on by ambulation:    Pain in feet at night that wakes you up from your sleep:     Blood clot in your veins:    Leg swelling:         Pulmonary    Oxygen at home:    Productive cough:     Wheezing:         Neurologic    Sudden weakness in arms or legs:     Sudden numbness in arms or legs:     Sudden onset of difficulty speaking or slurred speech:    Temporary loss of vision in one eye:     Problems with dizziness:         Gastrointestinal    Blood in stool:      Vomited blood:         Genitourinary    Burning when urinating:     Blood in urine:        Psychiatric    Major depression:         Hematologic  Bleeding problems:    Problems with blood clotting too easily:        Skin    Rashes or ulcers: x       Constitutional    Fever or chills:     PHYSICAL EXAM:   Vitals:   02/24/18 0800 02/24/18 1000 02/24/18 1217 02/24/18 1350  BP: (!) 150/53 (!) 128/46  (!) 120/44  Pulse: 81 80  81  Resp: 11 19  18   Temp:   98.2 F (36.8 C) 98.3 F (36.8 C)  TempSrc:   Oral Oral  SpO2: 91% 91%  96%  Weight:      Height:        GENERAL: The patient is a well-nourished female, in no acute distress. The vital signs are documented above. CARDIAC: There is a regular rate and rhythm.  VASCULAR: Palpable femoral pulses bilaterally PULMONARY: Nonlabored respirations ABDOMEN: Soft and non-tender with normal pitched bowel sounds.  MUSCULOSKELETAL: Right leg amputation has healed NEUROLOGIC: No focal weakness or paresthesias are detected. SKIN: Large ulceration of the dorsum of the left foot.  No significant surrounding erythema. PSYCHIATRIC: The patient has a normal affect.  STUDIES:   I have reviewed her previous vascular lab studies.  ABIs are unreliable due to the patient not being able to tolerate the exam.  She did have stenosis within the left common femoral artery and plaque throughout the superficial femoral  artery.  ASSESSMENT and PLAN   Left foot ulcer: I discussed with the patient that this is a limb threatening situation.  She most likely will require amputation however she is refusing any amputation at this time.  She would like to proceed with anything possible to try to save her leg.  In order to better evaluate this, she will need angiography.  However, she is suffering from acute renal insufficiency.  She will need to be hydrated and see if her renal function improves.  If it does, I would consider angiography.  If this is to be done, it will need to be done at [facility].  She would need to be transferred.  The earliest available date for angiography would be Friday, however I am not sure her renal function will be satisfactory at that time, and therefore this may need to be delayed until next week.   Annamarie Major, MD [facility] [phone] Pager [phone]

## 2018-02-24 NOTE — Progress Notes (Signed)
PROGRESS NOTE    Shelby Reese  YTK:[id number] DOB: July 17, 1935 DOA: 02/23/2018 PCP: [name], MD    Brief Narrative:  83 year old with past medical history relevant for peripheral arterial disease status post right AKA on 12/15/2017, bilateral severe chronic venous stasis, hypertension, iron deficiency anemia, hypothyroidism admitted with acute on chronic anemia with guaiac positive stools and acute kidney injury in the setting of worsening left lower extremity ulcer and infection.   Assessment & Plan:   Active Problems:   Hypothyroidism, acquired   Iron deficiency anemia   Atherosclerosis of artery of right lower extremity (HCC)   Essential hypertension   CKD (chronic kidney disease), stage III (HCC)   Peripheral vascular disease ([place])   Anemia   Foot ulcer ([place])  #) Left lower extremity infection: Patient has large nonhealing ulcers on left lower extremity on dorsum site at site of injury.  She has no palpable pulses on her DP or PT site.  Her foot is mottled and erythematous with chronic venous stasis changes in lower extremity.  She apparently had an AKA for similar on the right concerning for possible peripheral arterial disease. -IV ceftriaxone 2 g and clindamycin -Follow-up blood culture x2 ordered 02/23/2018 - ABIs ordered -Vascular surgery consult -MRI of left foot ordered with contrast  #) Acute on chronic anemia: Patient has history of chronic iron iron deficiency anemia however she reports to me that she also has a history of "a genetic anemia".  Her ethnic background is half [place] half [place].  She is not Jewish.  She does report a history of a bone marrow biopsy that she cannot recall.  She has once been on epo supplementation.  Regardless she cannot member her last colonoscopy and it would be unexpected to have guaiac positive stools for genetic anemia.  She is unlikely to have a thalassemia as her MCV is normal -We will look at blood smear - GI  consult -Continue PPI -Continue oral iron supplementation  #) Acute on chronic kidney injury: Patient carries a diagnosis of stage II CKD however last creatinine last month was normal with a normal GFR.  Suspect likely related to infection. -Hold home benazepril -IV fluids -Avoid nephrotoxins  #) Peripheral arterial disease: Patient carries this diagnosis however no ABIs in the chart. -ABIs ordered -Vascular surgery consult -Unclear why patient is not on statin as it is not on her allergy list -Continue aspirin 81 mg daily #) Hypertension: -Hold home benazepril 40 mg in setting of a KI next line-continue diltiazem 120 mg daily -Continue hydralazine 50 mg 4 times daily -Hold home furosemide 20 mg daily  #)chronic venous stasis: Likely contributing to her nonhealing ulcer. - Hold home furosemide  #) Hypothyroidism: -Continue levothyroxine 175 mcg daily  #) Pain/psych: -Continue fentanyl patch 25 mcg every 3 days -Continue escitalopram 10 mg daily -Continue amitriptyline 50 mg nightly -Continue PRN oxycodone -Continue PRN methocarbamol 500 mg every 6 hours  Fluids: Gentle IV fluids Elect lites: Monitor and supplement Nutrition: Heart healthy diet  Prophylaxis: Heparin   Disposition: Pending evaluation of limb salvage ability and anemia  Full code   Consultants:   Gastroenterology  Vascular surgery  Procedures: (Don't include imaging studies which can be auto populated. Include things that cannot be auto populated i.e. Echo, Carotid and venous dopplers, Foley, Bipap, HD, tubes/drains, wound vac, central lines etc)  ABI ordered 02/24/2018 pending  Antimicrobials: (specify start and planned stop date. Auto populated tables are space occupying and do not give end  dates)  IV ceftriaxone and clindamycin started 02/24/2018   Subjective: Patient reports that she continues to have pain in her leg.  She is quite concerned about needing an amputation and is hoping that  something could be done to prevent it as she does not want to have both legs amputated.  Objective: Vitals:   02/24/18 0342 02/24/18 0600 02/24/18 0747 02/24/18 0800  BP:  (!) 122/42  (!) 150/53  Pulse:  80  81  Resp:  10  11  Temp: 97.8 F (36.6 C)  98.2 F (36.8 C)   TempSrc: Axillary  Oral   SpO2:  (!) 89%  91%  Weight:  81.9 kg (180 lb 8.9 oz)    Height:        Intake/Output Summary (Last 24 hours) at 02/24/2018 1015 Last data filed at 02/24/2018 0600 Gross per 24 hour  Intake 1652.5 ml  Output 400 ml  Net 1252.5 ml   Filed Weights   02/23/18 2100 02/24/18 0600  Weight: 81.3 kg (179 lb 3.7 oz) 81.9 kg (180 lb 8.9 oz)    Examination:  General exam: Appears calm and comfortable  Respiratory system: Clear to auscultation. Respiratory effort normal. Cardiovascular system: Distant heart sounds, no murmurs Gastrointestinal system: Soft, nondistended, nontender, plus bowel sounds neuro: Ert and oriented. No focal neurological deficits. Extremities: Right AKA, left chronic venous stasis changes with no pulses felt Skin: Over dorsum of left foot noted to have 2 large purulent ulcers that probe fairly deep, surrounding erythema and edema and redness, Psychiatry: Judgement and insight appear normal. Mood & affect appropriate.     Data Reviewed: I have personally reviewed following labs and imaging studies  CBC: Recent Labs  Lab 02/23/18 1518 02/24/18 0333  WBC 9.9 7.2  NEUTROABS 7.2  --   HGB 6.9* 8.2*  HCT 22.8* 26.7*  MCV 87.0 86.1  PLT 435* 003   Basic Metabolic Panel: Recent Labs  Lab 02/23/18 1518 02/24/18 0333  NA 133* 136  K 4.7 4.8  CL 99* 101  CO2 24 25  GLUCOSE 109* 110*  BUN 48* 51*  CREATININE 2.30* 2.37*  CALCIUM 9.7 9.2   GFR: Estimated Creatinine Clearance: 19.2 mL/min (A) (by C-G formula based on SCr of 2.37 mg/dL (H)). Liver Function Tests: Recent Labs  Lab 02/23/18 1518  AST 21  ALT 14  ALKPHOS 56  BILITOT 0.4  PROT 7.3  ALBUMIN  3.2*   No results for input(s): LIPASE, AMYLASE in the last 168 hours. No results for input(s): AMMONIA in the last 168 hours. Coagulation Profile: No results for input(s): INR, PROTIME in the last 168 hours. Cardiac Enzymes: No results for input(s): CKTOTAL, CKMB, CKMBINDEX, TROPONINI in the last 168 hours. BNP (last 3 results) No results for input(s): PROBNP in the last 8760 hours. HbA1C: No results for input(s): HGBA1C in the last 72 hours. CBG: No results for input(s): GLUCAP in the last 168 hours. Lipid Profile: No results for input(s): CHOL, HDL, LDLCALC, TRIG, CHOLHDL, LDLDIRECT in the last 72 hours. Thyroid Function Tests: No results for input(s): TSH, T4TOTAL, FREET4, T3FREE, THYROIDAB in the last 72 hours. Anemia Panel: Recent Labs    02/23/18 1518 02/23/18 1630 02/24/18 0333  VITAMINB12  --  277 245  FOLATE  --  12.6 11.1  FERRITIN  --  23 25  TIBC  --  259 266  IRON  --  12* 122  RETICCTPCT 2.1  --  2.1   Sepsis Labs: Recent Labs  Lab  02/23/18 1605  LATICACIDVEN 0.63    Recent Results (from the past 240 hour(s))  MRSA PCR Screening     Status: None   Collection Time: 02/23/18  2:49 PM  Result Value Ref Range Status   MRSA by PCR NEGATIVE NEGATIVE Final    Comment:        The GeneXpert MRSA Assay (FDA approved for NASAL specimens only), is one component of a comprehensive MRSA colonization surveillance program. It is not intended to diagnose MRSA infection nor to guide or monitor treatment for MRSA infections. Performed at [facility], [place] [address] [place], [place] [address]          Radiology Studies: Dg Foot Complete Left  Result Date: 02/23/2018 CLINICAL DATA:  Left foot redness, pain and swelling. Question osteomyelitis. EXAM: LEFT FOOT - COMPLETE 3+ VIEW COMPARISON:  None. FINDINGS: There appear to be skin wounds over the dorsum of the foot centered at the level of the cuneiforms, neck of the talus and tibiotalar  joint. Lucencies are identified in the bones the midfoot, most conspicuous in the cuboid and lateral cuneiform as well as the fourth metatarsal. No fracture. Atherosclerosis noted. No soft tissue gas or radiopaque foreign body. IMPRESSION: Lucencies in the bones of the midfoot are nonspecific and could be due to early neuropathic change or osteomyelitis. Skin wounds on the dorsum of the foot. Electronically Signed   By: Inge Rise M.D.   On: 02/23/2018 15:55        Scheduled Meds: . amitriptyline  50 mg Oral QHS  . aspirin EC  81 mg Oral Q breakfast  . benazepril  40 mg Oral Daily  . calcium-vitamin D  1 tablet Oral Q breakfast  . diltiazem  120 mg Oral Q breakfast  . docusate sodium  100 mg Oral Daily  . escitalopram  10 mg Oral Daily  . famotidine  40 mg Oral Q1500  . feeding supplement (ENSURE ENLIVE)  237 mL Oral TID WC  . fentaNYL  25 mcg Transdermal Q72H  . ferrous sulfate  325 mg Oral BID WC  . furosemide  20 mg Oral Daily  . heparin  5,000 Units Subcutaneous Q8H  . hydrALAZINE  50 mg Oral QID  . levothyroxine  175 mcg Oral QAC breakfast  . loratadine  10 mg Oral Daily  . multivitamin with minerals  1 tablet Oral Daily  . ondansetron (ZOFRAN) IV  4 mg Intravenous Q6H  . oxyCODONE  5 mg Oral Q4H  . pantoprazole  40 mg Oral Daily  . polyethylene glycol  17 g Oral BID  . senna-docusate  2 tablet Oral BID  . vitamin C  500 mg Oral Daily   Continuous Infusions: . sodium chloride 75 mL/hr at 02/24/18 0154  . sodium chloride    . cefTRIAXone (ROCEPHIN)  IV    . clindamycin (CLEOCIN) IV Stopped (02/24/18 0930)     LOS: 1 day    Time spent: Humphrey, MD [facility]   If 7PM-7AM, please contact night-coverage [email or web address] Password [email or web address] 02/24/2018, 10:15 AM

## 2018-02-24 NOTE — Consult Note (Addendum)
   [facility] CM Inpatient Consult   02/24/2018  DEKLYNN CHARLET [birth year] [id number]   Made aware of hospitalization by [place].   Please see chart review tab then encounters for patient outreach details.   Went to bedside to speak with Mrs. Friel, after she was transferred off stepdown unit. However, she was resting soundly and family not present.   Will make inpatient RNCM aware that [facility] is following.    Marthenia Rolling, MSN-Ed, RN,BSN [facility] Liaison [phone]

## 2018-02-24 NOTE — Patient Outreach (Signed)
[facility]) Care Management  02/24/2018  [name] 09-01-1935 [id number]   Member readmitted to hospital yesterday for infection of left foot, nonhealing wound.  Collaboration with post acute coordinator, LCSW, and C. Spinks, NP.  [name] will follow member once she is discharged.  Hospital liaisons notified of admission.  Valente David, [place], MSN [place] [phone]

## 2018-02-25 ENCOUNTER — Encounter (HOSPITAL_COMMUNITY): Payer: Self-pay | Admitting: *Deleted

## 2018-02-25 ENCOUNTER — Inpatient Hospital Stay (HOSPITAL_COMMUNITY): Payer: Medicare Other

## 2018-02-25 DIAGNOSIS — L97523 Non-pressure chronic ulcer of other part of left foot with necrosis of muscle: Secondary | ICD-10-CM

## 2018-02-25 LAB — BASIC METABOLIC PANEL WITH GFR
Anion gap: 9 (ref 5–15)
BUN: 51 mg/dL — ABNORMAL HIGH (ref 6–20)
CO2: 23 mmol/L (ref 22–32)
Glucose, Bld: 86 mg/dL (ref 65–99)

## 2018-02-25 LAB — CBC
HCT: 25.4 % — ABNORMAL LOW (ref 36.0–46.0)
Hemoglobin: 7.9 g/dL — ABNORMAL LOW (ref 12.0–15.0)
MCH: 26.7 pg (ref 26.0–34.0)
MCHC: 31.1 g/dL (ref 30.0–36.0)
MCV: 85.8 fL (ref 78.0–100.0)
Platelets: 282 K/uL (ref 150–400)
RBC: 2.96 MIL/uL — ABNORMAL LOW (ref 3.87–5.11)
RDW: 16.1 % — ABNORMAL HIGH (ref 11.5–15.5)
WBC: 6.3 10*3/uL (ref 4.0–10.5)

## 2018-02-25 LAB — BASIC METABOLIC PANEL
Calcium: 9 mg/dL (ref 8.9–10.3)
Chloride: 104 mmol/L (ref 101–111)
Creatinine, Ser: 2.17 mg/dL — ABNORMAL HIGH (ref 0.44–1.00)
GFR calc Af Amer: 24 mL/min — ABNORMAL LOW (ref >60)
GFR calc non Af Amer: 20 mL/min — ABNORMAL LOW (ref >60)
Potassium: 4.7 mmol/L (ref 3.5–5.1)
Sodium: 136 mmol/L (ref 135–145)

## 2018-02-25 LAB — MAGNESIUM: Magnesium: 2.1 mg/dL (ref 1.7–2.4)

## 2018-02-25 NOTE — Clinical Social Work Note (Signed)
Clinical Social Work Assessment  Patient Details  Name: Shelby Reese MRN: [id number] Date of Birth: [birth year]  Date of referral:  02/25/18               Reason for consult:  Discharge Planning                Permission sought to share information with:  Chartered certified accountant granted to share information::     Name::        Agency::  [facility]  Relationship::     Contact Information:     Housing/Transportation Living arrangements for the past 2 months:  [place], Skilled Nursing Facility(Patient recently transitioned from [facility] to [facility]) Source of Information:  Patient, Facility Patient Interpreter Needed:  None Criminal Activity/Legal Involvement Pertinent to Current Situation/Hospitalization:  No - Comment as needed Significant Relationships:  Spouse, Siblings Lives with:  Spouse Do you feel safe going back to the place where you live?  Yes Need for family participation in patient care:  No (Coment)  Care giving concerns:  Patient from [facility] for respite care. Patient recently discharged from [facility] (short term rehab) to ALF. Patient reported that she used a slide board to transfer from the bed to the wheelchair prior to hospitalization. Staff from ALF reported that patient needed assistance with bathing and dressing prior to hospitalization. Patient verbalized plan to discharge back to ALF for respite care.    Social Worker assessment / plan:  CSW spoke with patient at bedside regarding discharge planning. Patient reported that she is from [facility] for respite care. Patient reported that she has been in out of hospitals and rehab since July 2018. Patient reported that she had her right leg amputated above the knee and that now they want to amputate her left leg. Patient reported that she is not having another leg amputated. Patient reported that she is interested in another treatment  option for her left leg or hospice services. Patient reported that she plans to return to [facility] and that she ultimately plans to sell her home and moving to an ALF with her husband.   CSW spoke with staff member [name] from [facility]. Staff reported that patient may need an assessment prior to determine patient's ability to return when medically stable.   CSW will continue to follow and assist with discharge planning.   Employment status:  Retired Forensic scientist:  Medicare PT Recommendations:  Not assessed at this time [place] / Referral to community resources:  Other (Comment Required)(Patient from [facility] (respite care))  Patient/Family's Response to care:  Patient appreciative of CSW assistance with discharge planning. Patient awaiting plan for treatment of her left leg infection.   Patient/Family's Understanding of and Emotional Response to Diagnosis, Current Treatment, and Prognosis:  Patient presented calm and was open throughout assessment. Patient verbalized strong understanding about diagnosis and current treatment. Patient reported that she is waiting to speak with doctor about other options to treat her leg besides amputation.  Patient informed CSW about her desired quality of life and concerns about having both her legs amputated. CSW validated patient's concerns and provided emotional support. Patient reported that if amputation is the only available option to treat her leg she is interested in hospice services. CSW agreed to continue to follow patient and assist with discharge planning according to patient's treatment plans.   Emotional Assessment Appearance:  Appears stated age Attitude/Demeanor/Rapport:  Other(Open) Affect (typically observed):  Calm Orientation:  Oriented to Self, Oriented to Place, Oriented to  Time, Oriented to Situation Alcohol / Substance use:  Not Applicable Psych involvement (Current and /or in the community):  No  (Comment)  Discharge Needs  Concerns to be addressed:  Care Coordination Readmission within the last 30 days:  No Current discharge risk:  Dependent with Mobility Barriers to Discharge:  Continued Medical Work up   [employer], LCSW 02/25/2018, 2:17 PM

## 2018-02-25 NOTE — Progress Notes (Signed)
Creatinine trending down, but still elevated.  Will consider angiogram once this normalizes, hopefully early next week.  This will need to be done at [facility].  Please contact me when patient can be transferred.  Wells Brabham [phone]

## 2018-02-25 NOTE — Progress Notes (Signed)
PROGRESS NOTE    Shelby Reese  WVP:[id number] DOB: 1935/01/16 DOA: 02/23/2018 PCP: [name], MD    Brief Narrative:  83 year old with past medical history relevant for peripheral arterial disease status post right AKA on 12/15/2017, bilateral severe chronic venous stasis, hypertension, iron deficiency anemia, hypothyroidism admitted with acute on chronic anemia with guaiac positive stools and acute kidney injury in the setting of worsening left lower extremity ulcer and infection.   Assessment & Plan:   Active Problems:   Hypothyroidism, acquired   Iron deficiency anemia   Atherosclerosis of artery of right lower extremity (HCC)   Essential hypertension   CKD (chronic kidney disease), stage III (HCC)   Peripheral vascular disease ([place])   Anemia   Foot ulcer ([place])  #) Left lower extremity infection: Patient was seen by vascular surgery who recommended transfer to [facility] for lower extremity arteriogram.  Patient is quite resistant to having this amputated despite being told that she would need agitated. -IV ceftriaxone 2 g and clindamycin started 02/23/2018 -Follow-up blood culture x2 ordered 02/23/2018 - ABIs could not be tolerated due to pain -Vascular surgery consult -MRI of left foot could not be tolerated due to pain  #) Acute on chronic anemia: Patient was seen by GI and apparently refused all further evaluation. -We will transfuse as needed per patient's request -Continue PPI -Continue oral iron supplementation  #) Acute on chronic kidney injury: Improving slightly today -Hold home benazepril -IV fluids -Renal ultrasound ordered -Avoid nephrotoxins  #) Peripheral arterial disease: Patient carries this diagnosis however no ABIs in the chart. -ABIs ordered -Vascular surgery consult -Unclear why patient is not on statin as it is not on her allergy list -Continue aspirin 81 mg daily  #) Hypertension: -Hold home benazepril 40 mg in setting of a KI -continue diltiazem  120 mg daily -Continue hydralazine 50 mg 4 times daily -Hold home furosemide 20 mg daily  #)chronic venous stasis: Likely contributing to her nonhealing ulcer. - Hold home furosemide  #) Hypothyroidism: -Continue levothyroxine 175 mcg daily  #) Pain/psych: -Continue fentanyl patch 25 mcg every 3 days -Continue escitalopram 10 mg daily -Continue amitriptyline 50 mg nightly -Continue PRN oxycodone -Continue PRN methocarbamol 500 mg every 6 hours  Fluids: Gentle IV fluids Elect lites: Monitor and supplement Nutrition: Heart healthy diet  Prophylaxis: Heparin   Disposition: Pending evaluation of limb salvage ability and anemia  Full code   Consultants:   Gastroenterology  Vascular surgery  Procedures: (Don't include imaging studies which can be auto populated. Include things that cannot be auto populated i.e. Echo, Carotid and venous dopplers, Foley, Bipap, HD, tubes/drains, wound vac, central lines etc)  None  Antimicrobials: (specify start and planned stop date. Auto populated tables are space occupying and do not give end dates)  IV ceftriaxone and clindamycin started 02/24/2018   Subjective: Patient reports that she continues to have pain in her leg.  She was unable to tolerate an MRI of her ABIs due to the pain.  She was seen by vascular surgery who recommended an arteriogram she was quite hesitant to move forward with an amputation.  Objective: Vitals:   02/24/18 1217 02/24/18 1350 02/24/18 2208 02/25/18 0618  BP:  (!) 120/44 (!) 124/49 (!) 121/49  Pulse:  81 91 75  Resp:  18 17 18   Temp: 98.2 F (36.8 C) 98.3 F (36.8 C) 98.4 F (36.9 C) 97.9 F (36.6 C)  TempSrc: Oral Oral Oral Oral  SpO2:  96% 95% 92%  Weight:  Height:        Intake/Output Summary (Last 24 hours) at 02/25/2018 1229 Last data filed at 02/25/2018 0200 Gross per 24 hour  Intake 1300 ml  Output 300 ml  Net 1000 ml   Filed Weights   02/23/18 2100 02/24/18 0600  Weight: 81.3 kg  (179 lb 3.7 oz) 81.9 kg (180 lb 8.9 oz)    Examination:  General exam: Appears calm and comfortable  Respiratory system: Clear to auscultation. Respiratory effort normal. Cardiovascular system: Distant heart sounds, no murmurs Gastrointestinal system: Soft, nondistended, nontender, plus bowel sounds neuro: Ert and oriented. No focal neurological deficits. Extremities: Right AKA, left chronic venous stasis changes with no pulses felt Skin: Over dorsum of left foot noted to have 2 large purulent ulcers that probe fairly deep, surrounding erythema and edema and redness, Psychiatry: Judgement and insight appear normal. Mood & affect appropriate.     Data Reviewed: I have personally reviewed following labs and imaging studies  CBC: Recent Labs  Lab 02/23/18 1518 02/24/18 0333 02/25/18 0527  WBC 9.9 7.2 6.3  NEUTROABS 7.2  --   --   HGB 6.9* 8.2* 7.9*  HCT 22.8* 26.7* 25.4*  MCV 87.0 86.1 85.8  PLT 435* 338 563   Basic Metabolic Panel: Recent Labs  Lab 02/23/18 1518 02/24/18 0333 02/25/18 0527  NA 133* 136 136  K 4.7 4.8 4.7  CL 99* 101 104  CO2 24 25 23   GLUCOSE 109* 110* 86  BUN 48* 51* 51*  CREATININE 2.30* 2.37* 2.17*  CALCIUM 9.7 9.2 9.0  MG  --   --  2.1   GFR: Estimated Creatinine Clearance: 21 mL/min (A) (by C-G formula based on SCr of 2.17 mg/dL (H)). Liver Function Tests: Recent Labs  Lab 02/23/18 1518  AST 21  ALT 14  ALKPHOS 56  BILITOT 0.4  PROT 7.3  ALBUMIN 3.2*   No results for input(s): LIPASE, AMYLASE in the last 168 hours. No results for input(s): AMMONIA in the last 168 hours. Coagulation Profile: No results for input(s): INR, PROTIME in the last 168 hours. Cardiac Enzymes: No results for input(s): CKTOTAL, CKMB, CKMBINDEX, TROPONINI in the last 168 hours. BNP (last 3 results) No results for input(s): PROBNP in the last 8760 hours. HbA1C: No results for input(s): HGBA1C in the last 72 hours. CBG: No results for input(s): GLUCAP in  the last 168 hours. Lipid Profile: No results for input(s): CHOL, HDL, LDLCALC, TRIG, CHOLHDL, LDLDIRECT in the last 72 hours. Thyroid Function Tests: No results for input(s): TSH, T4TOTAL, FREET4, T3FREE, THYROIDAB in the last 72 hours. Anemia Panel: Recent Labs    02/23/18 1518 02/23/18 1630 02/24/18 0333  VITAMINB12  --  277 245  FOLATE  --  12.6 11.1  FERRITIN  --  23 25  TIBC  --  259 266  IRON  --  12* 122  RETICCTPCT 2.1  --  2.1   Sepsis Labs: Recent Labs  Lab 02/23/18 1605  LATICACIDVEN 0.63    Recent Results (from the past 240 hour(s))  MRSA PCR Screening     Status: None   Collection Time: 02/23/18  2:49 PM  Result Value Ref Range Status   MRSA by PCR NEGATIVE NEGATIVE Final    Comment:        The GeneXpert MRSA Assay (FDA approved for NASAL specimens only), is one component of a comprehensive MRSA colonization surveillance program. It is not intended to diagnose MRSA infection nor to guide or monitor treatment for  MRSA infections. Performed at [facility], [place] [address] [place], [place] [address]   Blood culture (routine x 2)     Status: None (Preliminary result)   Collection Time: 02/23/18  3:23 PM  Result Value Ref Range Status   Specimen Description   Final    BLOOD RIGHT ANTECUBITAL Performed at [facility] [address], [place], [place] [address]    Special Requests   Final    BOTTLES DRAWN AEROBIC AND ANAEROBIC Blood Culture adequate volume Performed at [facility] [address] [place], [place] [address]    Culture   Final    NO GROWTH < 24 HOURS Performed at [facility] [address], [place], [place] [address]    Report Status PENDING  Incomplete  Urine culture     Status: Abnormal (Preliminary result)   Collection Time: 02/23/18  9:29 PM  Result Value Ref Range Status   Specimen Description   Final    URINE, RANDOM Performed at [facility] [address] [place], [place] [address]    Special Requests   Final    NONE Performed at [facility], [place] [address] [place], [place] [address]    Culture (A)  Final    >=100,000 COLONIES/mL PROVIDENCIA RETTGERI SUSCEPTIBILITIES TO FOLLOW Performed at [facility], [place] [address] [place],  [address]    Report Status PENDING  Incomplete         Radiology Studies: US Renal  Result Date: 02/25/2018 CLINICAL DATA:  Acute kidney injury, history hypertension, stage III chronic kidney disease, former smoker EXAM: RENAL / URINARY TRACT ULTRASOUND COMPLETE COMPARISON:  None FINDINGS: Right Kidney: Length: 9.9 cm. Minimal cortical thinning. Normal cortical echogenicity. No mass, hydronephrosis or shadowing calcification. Left Kidney: Length: 9.6 cm. Minimal cortical thinning. Normal cortical echogenicity. No mass, hydronephrosis or shadowing calcification. Bladder: Echogenic debris dependently within urinary bladder. No gross bladder wall thickening/mass. IMPRESSION: Unremarkable sonographic appearance of the kidneys for age. Significant echogenic debris dependently within urinary bladder. Electronically Signed   By: Lavonia Dana M.D.   On: 02/25/2018 10:56   Dg Foot Complete Left  Result Date: 02/23/2018 CLINICAL DATA:  Left foot redness, pain and swelling. Question osteomyelitis. EXAM: LEFT FOOT - COMPLETE 3+ VIEW COMPARISON:  None. FINDINGS: There appear to be skin wounds over the dorsum of the foot centered at the level of the cuneiforms, neck of the talus and tibiotalar joint. Lucencies are identified in the bones the midfoot, most conspicuous in the cuboid and lateral cuneiform as well as the fourth metatarsal. No fracture. Atherosclerosis noted. No soft tissue gas or radiopaque foreign body. IMPRESSION: Lucencies in the bones of the midfoot are nonspecific and could be due to early neuropathic change or osteomyelitis. Skin wounds on the dorsum of  the foot. Electronically Signed   By: Inge Rise M.D.   On: 02/23/2018 15:55        Scheduled Meds: . amitriptyline  50 mg Oral QHS  . aspirin EC  81 mg Oral Q breakfast  . calcium-vitamin D  1 tablet Oral Q breakfast  . diltiazem  120 mg Oral Q breakfast  . docusate sodium  100 mg Oral Daily  . escitalopram  10 mg Oral Daily  . famotidine  20 mg Oral Q1500  . feeding supplement (ENSURE ENLIVE)  237 mL Oral TID WC  . fentaNYL  25 mcg Transdermal Q72H  . ferrous sulfate  325 mg Oral BID WC  . heparin  5,000 Units Subcutaneous Q8H  . hydrALAZINE  50 mg Oral QID  . levothyroxine  175 mcg Oral QAC breakfast  . loratadine  10 mg Oral Daily  . multivitamin with minerals  1 tablet Oral Daily  . oxyCODONE  5 mg Oral Q4H  . pantoprazole  40 mg Oral Daily  . polyethylene glycol  17 g Oral BID  . senna-docusate  2 tablet Oral BID  . vitamin C  500 mg Oral Daily   Continuous Infusions: . sodium chloride 75 mL/hr at 02/25/18 0224  . sodium chloride    . cefTRIAXone (ROCEPHIN)  IV Stopped (02/25/18 1214)  . clindamycin (CLEOCIN) IV Stopped (02/25/18 1145)     LOS: 2 days    Time spent: [place], MD [facility]   If 7PM-7AM, please contact night-coverage [email or web address] Password [facility] 02/25/2018, 12:29 PM

## 2018-02-26 LAB — BASIC METABOLIC PANEL WITH GFR
Calcium: 9.1 mg/dL (ref 8.9–10.3)
Creatinine, Ser: 1.84 mg/dL — ABNORMAL HIGH (ref 0.44–1.00)
GFR calc Af Amer: 29 mL/min — ABNORMAL LOW (ref >60)
GFR calc non Af Amer: 25 mL/min — ABNORMAL LOW (ref >60)
Sodium: 135 mmol/L (ref 135–145)

## 2018-02-26 LAB — URINE CULTURE

## 2018-02-26 LAB — BASIC METABOLIC PANEL
Anion gap: 11 (ref 5–15)
BUN: 45 mg/dL — ABNORMAL HIGH (ref 6–20)
CO2: 23 mmol/L (ref 22–32)
Chloride: 101 mmol/L (ref 101–111)
Glucose, Bld: 88 mg/dL (ref 65–99)
Potassium: 4.8 mmol/L (ref 3.5–5.1)

## 2018-02-26 NOTE — Care Management Important Message (Signed)
Important Message  Patient Details  Name: DEAVEN BARRON MRN: [id number] Date of Birth: [birth year]   Medicare Important Message Given:  Yes    Kerin Salen 02/26/2018, 10:15 AMImportant Message  Patient Details  Name: ARMANI GAWLIK MRN: [id number] Date of Birth: [birth year]   Medicare Important Message Given:  Yes    Kerin Salen 02/26/2018, 10:15 AM

## 2018-02-26 NOTE — Progress Notes (Addendum)
PT Cancellation Note  Patient Details Name: Shelby Reese MRN: [id number] DOB: July 17, 1935   Cancelled Treatment:    Reason Eval/Treat Not Completed: Pain limiting ability to participate (pt refused to attempt any movement 2* severe LLE pain. Pt has had pain medication this morning. Mechanical lift recommended for mobility.  Will follow. )   Philomena Doheny 02/26/2018, 11:47 AM [phone]

## 2018-02-26 NOTE — Consult Note (Signed)
   [facility] CM Inpatient Consult   02/26/2018  Shelby Reese 1934/11/27 [id number]     [facility] Care Management follow up.   Discussed with inpatient RNCM that [facility] NP suggested goals of care for patient. Request was made to MD. Per notes, [name] would be willing to have her left leg amputated if it meant saving her life. No decision for palliative or hospice. She is interested in other treatment options.  Disposition plan is for ALF.   Will keep [facility] NP updated.   Appreciative of inpatient [facility] collaboration efforts.    Marthenia Rolling, MSN-Ed, RN,BSN [facility] Liaison [phone]

## 2018-02-26 NOTE — Progress Notes (Signed)
PROGRESS NOTE    Shelby Reese  DJM:[id number] DOB: 1935-11-05 DOA: 02/23/2018 PCP: [name], MD    Brief Narrative:  83 year old with past medical history relevant for peripheral arterial disease status post right AKA on 12/15/2017, bilateral severe chronic venous stasis, hypertension, iron deficiency anemia, hypothyroidism admitted with acute on chronic anemia with guaiac positive stools and acute kidney injury in the setting of worsening left lower extremity ulcer and infection.   Assessment & Plan:   Active Problems:   Hypothyroidism, acquired   Iron deficiency anemia   Atherosclerosis of artery of right lower extremity (HCC)   Essential hypertension   CKD (chronic kidney disease), stage III ([place])   Peripheral vascular disease (HCC)   Anemia   Foot ulcer ([place])  #) Left lower extremity infection:.  Patient is quite resistant to having this amputated despite being told that she would need agitated. -IV ceftriaxone 2 g and clindamycin started 02/23/2018 -Follow-up blood culture x2 ordered 02/23/2018 - ABIs could not be tolerated due to pain -Vascular surgery consult, pending transfer to [facility] for arteriogram once creatinine stabilizes -MRI of left foot could not be tolerated due to pain  #) Acute on chronic anemia: Patient was seen by GI and apparently refused all further evaluation. -We will transfuse as needed per patient's request -Continue PPI -Continue oral iron supplementation  #) Acute on chronic kidney injury: Continues to gradually improve -Hold home benazepril -IV fluids -Renal ultrasound done on 02/25/2018 shows no evidence of chronic kidney disease and no acute process -Avoid nephrotoxins  #) Peripheral arterial disease: Patient carries this diagnosis however no ABIs in the chart. -ABIs could not be tolerated -Vascular surgery consult per above -Unclear why patient is not on statin as it is not on her allergy list -Continue aspirin 81 mg daily  #)  Hypertension: -Hold home benazepril 40 mg in setting of a KI -continue diltiazem 120 mg daily -Continue hydralazine 50 mg 4 times daily -Hold home furosemide 20 mg daily  #)chronic venous stasis: Likely contributing to her nonhealing ulcer. - Hold home furosemide  #) Hypothyroidism: -Continue levothyroxine 175 mcg daily  #) Pain/psych: -Continue fentanyl patch 25 mcg every 3 days -Continue escitalopram 10 mg daily -Continue amitriptyline 50 mg nightly -Continue PRN oxycodone -Continue PRN methocarbamol 500 mg every 6 hours  Fluids: Gentle IV fluids Elect lites: Monitor and supplement Nutrition: Heart healthy diet  Prophylaxis: Heparin   Disposition: Pending evaluation of limb salvage ability and anemia  Full code   Consultants:   Gastroenterology  Vascular surgery  Procedures: (Don't include imaging studies which can be auto populated. Include things that cannot be auto populated i.e. Echo, Carotid and venous dopplers, Foley, Bipap, HD, tubes/drains, wound vac, central lines etc) 02/25/2018 renal ultrasound:IMPRESSION:  Unremarkable sonographic appearance of the kidneys for age.  Antimicrobials: (specify start and planned stop date. Auto populated tables are space occupying and do not give end dates)  IV ceftriaxone and clindamycin started 02/24/2018   Subjective: Patient reports that she is feeling somewhat better.  She is glad that her creatinine is continued to improve.  Had a long discussion with the patient and she would be willing to have her leg amputated if it meant saving her life however she would like all other avenues exhausted.  Objective: Vitals:   02/25/18 0618 02/25/18 1408 02/25/18 2212 02/26/18 0511  BP: (!) 121/49 (!) 100/38 (!) 132/53 (!) 150/53  Pulse: 75 79 78 77  Resp: 18 16 16 13   Temp: 97.9 F (  36.6 C) 98 F (36.7 C) 98.7 F (37.1 C) 98 F (36.7 C)  TempSrc: Oral Oral Oral Oral  SpO2: 92% 91% 92% 96%  Weight:      Height:         Intake/Output Summary (Last 24 hours) at 02/26/2018 1225 Last data filed at 02/26/2018 0511 Gross per 24 hour  Intake 2675.83 ml  Output 850 ml  Net 1825.83 ml   Filed Weights   02/23/18 2100 02/24/18 0600  Weight: 81.3 kg (179 lb 3.7 oz) 81.9 kg (180 lb 8.9 oz)    Examination:  General exam: Appears calm and comfortable  Respiratory system: Clear to auscultation. Respiratory effort normal. Cardiovascular system: Distant heart sounds, no murmurs Gastrointestinal system: Soft, nondistended, nontender, plus bowel sounds neuro: Alert and oriented. No focal neurological deficits. Extremities: Right AKA, left chronic venous stasis changes with no pulses felt Skin: Over dorsum of left foot noted to have 2 large purulent ulcers that probe fairly deep, surrounding erythema and edema and redness, Psychiatry: Judgement and insight appear normal. Mood & affect appropriate.     Data Reviewed: I have personally reviewed following labs and imaging studies  CBC: Recent Labs  Lab 02/23/18 1518 02/24/18 0333 02/25/18 0527  WBC 9.9 7.2 6.3  NEUTROABS 7.2  --   --   HGB 6.9* 8.2* 7.9*  HCT 22.8* 26.7* 25.4*  MCV 87.0 86.1 85.8  PLT 435* 338 341   Basic Metabolic Panel: Recent Labs  Lab 02/23/18 1518 02/24/18 0333 02/25/18 0527 02/26/18 0504  NA 133* 136 136 135  K 4.7 4.8 4.7 4.8  CL 99* 101 104 101  CO2 24 25 23 23   GLUCOSE 109* 110* 86 88  BUN 48* 51* 51* 45*  CREATININE 2.30* 2.37* 2.17* 1.84*  CALCIUM 9.7 9.2 9.0 9.1  MG  --   --  2.1  --    GFR: Estimated Creatinine Clearance: 24.7 mL/min (A) (by C-G formula based on SCr of 1.84 mg/dL (H)). Liver Function Tests: Recent Labs  Lab 02/23/18 1518  AST 21  ALT 14  ALKPHOS 56  BILITOT 0.4  PROT 7.3  ALBUMIN 3.2*   No results for input(s): LIPASE, AMYLASE in the last 168 hours. No results for input(s): AMMONIA in the last 168 hours. Coagulation Profile: No results for input(s): INR, PROTIME in the last 168  hours. Cardiac Enzymes: No results for input(s): CKTOTAL, CKMB, CKMBINDEX, TROPONINI in the last 168 hours. BNP (last 3 results) No results for input(s): PROBNP in the last 8760 hours. HbA1C: No results for input(s): HGBA1C in the last 72 hours. CBG: No results for input(s): GLUCAP in the last 168 hours. Lipid Profile: No results for input(s): CHOL, HDL, LDLCALC, TRIG, CHOLHDL, LDLDIRECT in the last 72 hours. Thyroid Function Tests: No results for input(s): TSH, T4TOTAL, FREET4, T3FREE, THYROIDAB in the last 72 hours. Anemia Panel: Recent Labs    02/23/18 1518 02/23/18 1630 02/24/18 0333  VITAMINB12  --  277 245  FOLATE  --  12.6 11.1  FERRITIN  --  23 25  TIBC  --  259 266  IRON  --  12* 122  RETICCTPCT 2.1  --  2.1   Sepsis Labs: Recent Labs  Lab 02/23/18 1605  LATICACIDVEN 0.63    Recent Results (from the past 240 hour(s))  MRSA PCR Screening     Status: None   Collection Time: 02/23/18  2:49 PM  Result Value Ref Range Status   MRSA by PCR NEGATIVE NEGATIVE Final  Comment:        The GeneXpert MRSA Assay (FDA approved for NASAL specimens only), is one component of a comprehensive MRSA colonization surveillance program. It is not intended to diagnose MRSA infection nor to guide or monitor treatment for MRSA infections. Performed at [facility], [place] [address] [place], [place] [address]   Blood culture (routine x 2)     Status: None (Preliminary result)   Collection Time: 02/23/18  3:23 PM  Result Value Ref Range Status   Specimen Description   Final    BLOOD RIGHT ANTECUBITAL Performed at [facility] [address], [place], [place] [address]    Special Requests   Final    BOTTLES DRAWN AEROBIC AND ANAEROBIC Blood Culture adequate volume Performed at [facility] [address], [place], [place] [address]    Culture   Final    NO GROWTH 2 DAYS Performed at [facility]  [address] [place], [place] [address]    Report Status PENDING  Incomplete  Urine culture     Status: Abnormal   Collection Time: 02/23/18  9:29 PM  Result Value Ref Range Status   Specimen Description   Final    URINE, RANDOM Performed at [facility] [address], [place], [place] [address]    Special Requests   Final    NONE Performed at [facility], [place] [address], [place], [place] [address]    Culture >=100,000 COLONIES/mL PROVIDENCIA RETTGERI (A)  Final   Report Status 02/26/2018 FINAL  Final   Organism ID, Bacteria PROVIDENCIA RETTGERI (A)  Final      Susceptibility   Providencia rettgeri - MIC*    AMPICILLIN RESISTANT Resistant     CEFAZOLIN >=64 RESISTANT Resistant     CEFTRIAXONE <=1 SENSITIVE Sensitive     CIPROFLOXACIN <=0.25 SENSITIVE Sensitive     GENTAMICIN <=1 SENSITIVE Sensitive     IMIPENEM 1 SENSITIVE Sensitive     NITROFURANTOIN 128 RESISTANT Resistant     TRIMETH/SULFA <=20 SENSITIVE Sensitive     AMPICILLIN/SULBACTAM 16 INTERMEDIATE Intermediate     PIP/TAZO <=4 SENSITIVE Sensitive     * >=100,000 COLONIES/mL PROVIDENCIA RETTGERI  Blood culture (routine x 2)     Status: None (Preliminary result)   Collection Time: 02/24/18  3:33 AM  Result Value Ref Range Status   Specimen Description   Final    BLOOD RIGHT HAND Performed at [facility] [address] [place], [place] [address]    Special Requests   Final    BOTTLES DRAWN AEROBIC AND ANAEROBIC Blood Culture adequate volume Performed at [facility] [address], [place], [place] [address]    Culture   Final    NO GROWTH 1 DAY Performed at [facility], [place] [address] [place], [place] [address]    Report Status PENDING  Incomplete         Radiology Studies: US Renal  Result Date: 02/25/2018 CLINICAL DATA:  Acute kidney injury, history hypertension, stage III chronic kidney disease, former smoker EXAM: RENAL  / URINARY TRACT ULTRASOUND COMPLETE COMPARISON:  None FINDINGS: Right Kidney: Length: 9.9 cm. Minimal cortical thinning. Normal cortical echogenicity. No mass, hydronephrosis or shadowing calcification. Left Kidney: Length: 9.6 cm. Minimal cortical thinning. Normal cortical echogenicity. No mass, hydronephrosis or shadowing calcification. Bladder: Echogenic debris dependently within urinary bladder. No gross bladder wall thickening/mass. IMPRESSION: Unremarkable sonographic appearance of the  kidneys for age. Significant echogenic debris dependently within urinary bladder. Electronically Signed   By: Lavonia Dana M.D.   On: 02/25/2018 10:56        Scheduled Meds: . amitriptyline  50 mg Oral QHS  . aspirin EC  81 mg Oral Q breakfast  . calcium-vitamin D  1 tablet Oral Q breakfast  . diltiazem  120 mg Oral Q breakfast  . docusate sodium  100 mg Oral Daily  . escitalopram  10 mg Oral Daily  . famotidine  20 mg Oral Q1500  . feeding supplement (ENSURE ENLIVE)  237 mL Oral TID WC  . fentaNYL  25 mcg Transdermal Q72H  . ferrous sulfate  325 mg Oral BID WC  . heparin  5,000 Units Subcutaneous Q8H  . hydrALAZINE  50 mg Oral QID  . levothyroxine  175 mcg Oral QAC breakfast  . loratadine  10 mg Oral Daily  . multivitamin with minerals  1 tablet Oral Daily  . oxyCODONE  5 mg Oral Q4H  . pantoprazole  40 mg Oral Daily  . polyethylene glycol  17 g Oral BID  . senna-docusate  2 tablet Oral BID  . vitamin C  500 mg Oral Daily   Continuous Infusions: . sodium chloride 125 mL/hr at 02/26/18 0400  . sodium chloride    . cefTRIAXone (ROCEPHIN)  IV Stopped (02/26/18 1110)  . clindamycin (CLEOCIN) IV Stopped (02/26/18 0913)     LOS: 3 days    Time spent: [place], MD [facility]   If 7PM-7AM, please contact night-coverage [email or web address] Password [facility] 02/26/2018, 12:25 PM

## 2018-02-26 NOTE — Consult Note (Signed)
[facility] Nurse wound consult note Reason for Consult:History of R AKA due to PAD, venous stasis disease.  Cellulitis to left foot with chronic worsening nonhealing wound to left heel and left dorsal foot.  Vascular team has recommended an amputation but patient refuses at this time. Vascular has recommended transfer to [facility] for angiogram once renal function stabilizes.   Wound type: PAD and venous insufficiency Pressure Injury POA: NA Measurement:Left heel:  1.5 cm x 0.5 cm  LEft forsal foot 8 cm x 3 cm   Wound bed:100% devitalized tissue.   Drainage (amount, consistency, odor) minimal purulent drainage necrotic odor Periwound: cool extremity Dressing procedure/placement/frequency: Cleanse wounds to left foot with NS.  Apply Xeroform to wound bed.  Cover with 4x4 and kerlix/tape.  Change daily.   Will not follow at this time.  Please re-consult if needed.  Domenic Moras RN BSN [place] Pager [phone]

## 2018-02-27 LAB — TYPE AND SCREEN
ABO/RH(D): A POS
ANTIBODY SCREEN: NEGATIVE
UNIT DIVISION: 0
UNIT DIVISION: 0
Unit division: 0
Unit division: 0

## 2018-02-27 LAB — BPAM RBC
BLOOD PRODUCT EXPIRATION DATE: 201904292359
Blood Product Expiration Date: 201904262359
Blood Product Expiration Date: 201904262359
Blood Product Expiration Date: 201904292359
ISSUE DATE / TIME: 201904081824
ISSUE DATE / TIME: 201904082239
UNIT TYPE AND RH: 6200
Unit Type and Rh: 6200
Unit Type and Rh: 6200
Unit Type and Rh: 6200

## 2018-02-27 LAB — BASIC METABOLIC PANEL
Anion gap: 9 (ref 5–15)
GFR calc non Af Amer: 32 mL/min — ABNORMAL LOW (ref >60)
Glucose, Bld: 86 mg/dL (ref 65–99)
Potassium: 4.7 mmol/L (ref 3.5–5.1)
Sodium: 135 mmol/L (ref 135–145)

## 2018-02-27 LAB — BASIC METABOLIC PANEL WITH GFR
BUN: 38 mg/dL — ABNORMAL HIGH (ref 6–20)
CO2: 22 mmol/L (ref 22–32)
Calcium: 9.1 mg/dL (ref 8.9–10.3)
Chloride: 104 mmol/L (ref 101–111)
Creatinine, Ser: 1.47 mg/dL — ABNORMAL HIGH (ref 0.44–1.00)
GFR calc Af Amer: 38 mL/min — ABNORMAL LOW (ref >60)

## 2018-02-27 MED ORDER — MAGNESIUM CITRATE PO SOLN: 1.0000 | Freq: Every day | ORAL | Status: DC | PRN

## 2018-02-27 MED ORDER — HYDROMORPHONE HCL 1 MG/ML IJ SOLN
0.5000 mg | INTRAMUSCULAR | Status: DC | PRN
  Administered 2018-02-28 – 2018-03-01 (×4): 0.5 mg via INTRAVENOUS
  Filled 2018-02-27 (×4): qty 0.5

## 2018-02-27 NOTE — Care Management Note (Signed)
Case Management Note  Patient Details  Name: Shelby Reese MRN: [id number] Date of Birth: [birth year]  Subjective/Objective: Active w/[facility], [facility] following. Noted CSW also following.Noted patient currently not interested in palliative cons.                     Action/Plan:current d/c plan return back to ALF   Expected Discharge Date:                  Expected Discharge Plan:  Assisted Living / Rest Home  In-House Referral:  Clinical Social Work  Discharge planning Services  CM Consult  Post Acute Care Choice:  Home Health(Active w/[facility] following.) Choice offered to:     DME Arranged:    DME Agency:     HH Arranged:    HH Agency:     Status of Service:  In process, will continue to follow  If discussed at Long Length of Stay Meetings, dates discussed:    Additional Comments:  Dessa Phi, RN 02/27/2018, 3:04 PM

## 2018-02-27 NOTE — Progress Notes (Signed)
PROGRESS NOTE    Shelby Reese  VOJ:[id number] DOB: 19-Oct-1935 DOA: 02/23/2018 PCP: [name], MD    Brief Narrative:  83 year old with past medical history relevant for peripheral arterial disease status post right AKA on 12/15/2017, bilateral severe chronic venous stasis, hypertension, iron deficiency anemia, hypothyroidism admitted with acute on chronic anemia with guaiac positive stools and acute kidney injury in the setting of worsening left lower extremity ulcer and infection.   Assessment & Plan:   Active Problems:   Hypothyroidism, acquired   Iron deficiency anemia   Atherosclerosis of artery of right lower extremity (HCC)   Essential hypertension   CKD (chronic kidney disease), stage III ([place])   Peripheral vascular disease (HCC)   Anemia   Foot ulcer ([place])  #) Left lower extremity infection:.   -IV ceftriaxone 2 g and clindamycin started 02/23/2018 -Follow-up blood culture x2 ordered 02/23/2018 - ABIs could not be tolerated due to pain -Vascular surgery consult, pending transfer to [name] for arteriogram once creatinine stabilizes -MRI of left foot could not be tolerated due to pain  #)  chronic anemia: Patient was seen by GI and apparently refused all further evaluation. -We will transfuse as needed per patient's request -Continue PPI -Continue oral iron supplementation  #) Acute on chronic kidney injury: Continues to gradually improve -Hold home benazepril -IV fluids -Renal ultrasound done on 02/25/2018 normal -Avoid nephrotoxins  #) Peripheral arterial disease: Patient carries this diagnosis however no ABIs in the chart. -ABIs could not be tolerated -Vascular surgery consult per above -Unclear why patient is not on statin as it is not on her allergy list -Continue aspirin 81 mg daily  #) Hypertension: -Hold home benazepril 40 mg in setting of a KI -continue diltiazem 120 mg daily -Continue hydralazine 50 mg 4 times daily -Hold home furosemide 20 mg  daily  #)chronic venous stasis: Likely contributing to her nonhealing ulcer. - Hold home furosemide  #) Hypothyroidism: -Continue levothyroxine 175 mcg daily  #) Pain/psych: -Continue fentanyl patch 25 mcg every 3 days -Continue escitalopram 10 mg daily -Continue amitriptyline 50 mg nightly -Continue PRN oxycodone -Continue PRN methocarbamol 500 mg every 6 hours  Fluids: Gentle IV fluids Elect lites: Monitor and supplement Nutrition: Heart healthy diet  Prophylaxis: Heparin   Disposition: Pending evaluation of limb salvage ability and anemia  Full code   Consultants:   Gastroenterology  Vascular surgery  Procedures: (Don't include imaging studies which can be auto populated. Include things that cannot be auto populated i.e. Echo, Carotid and venous dopplers, Foley, Bipap, HD, tubes/drains, wound vac, central lines etc) 02/25/2018 renal ultrasound:IMPRESSION:  Unremarkable sonographic appearance of the kidneys for age.  Antimicrobials: (specify start and planned stop date. Auto populated tables are space occupying and do not give end dates)  IV ceftriaxone and clindamycin started 02/24/2018   Subjective: Patient reports that she feels well.  She has a somewhat circuitous way of talking however it appears that she is excited to have her left leg evaluated and try to avoid amputation.  She otherwise reports significant pain in her leg and is requesting IV pain medication as breakthrough for her oral. Objective: Vitals:   02/26/18 1354 02/26/18 1412 02/26/18 2056 02/27/18 0550  BP: (!) 155/54 (!) 135/54 (!) 152/51 (!) 143/51  Pulse:  74 80 75  Resp:  19 18 18   Temp:  98.1 F (36.7 C) 98 F (36.7 C) 97.9 F (36.6 C)  TempSrc:  Oral Oral Oral  SpO2:  95% 93% 97%  Weight:  Height:        Intake/Output Summary (Last 24 hours) at 02/27/2018 1242 Last data filed at 02/27/2018 1119 Gross per 24 hour  Intake 4814.58 ml  Output 1000 ml  Net 3814.58 ml   Filed  Weights   02/23/18 2100 02/24/18 0600  Weight: 81.3 kg (179 lb 3.7 oz) 81.9 kg (180 lb 8.9 oz)    Examination:  General exam: Appears calm and comfortable  Respiratory system: Clear to auscultation. Respiratory effort normal. Cardiovascular system: Distant heart sounds, no murmurs Gastrointestinal system: Soft, nondistended, nontender, plus bowel sounds neuro: Alert and oriented. No focal neurological deficits. Extremities: Right AKA, left chronic venous stasis changes with no pulses felt Skin: Over dorsum of left foot noted to have 2 large purulent ulcers that probe fairly deep, surrounding erythema and edema and redness, Psychiatry: Judgement and insight appear normal. Mood & affect appropriate.     Data Reviewed: I have personally reviewed following labs and imaging studies  CBC: Recent Labs  Lab 02/23/18 1518 02/24/18 0333 02/25/18 0527  WBC 9.9 7.2 6.3  NEUTROABS 7.2  --   --   HGB 6.9* 8.2* 7.9*  HCT 22.8* 26.7* 25.4*  MCV 87.0 86.1 85.8  PLT 435* 338 269   Basic Metabolic Panel: Recent Labs  Lab 02/23/18 1518 02/24/18 0333 02/25/18 0527 02/26/18 0504 02/27/18 0533  NA 133* 136 136 135 135  K 4.7 4.8 4.7 4.8 4.7  CL 99* 101 104 101 104  CO2 24 25 23 23 22   GLUCOSE 109* 110* 86 88 86  BUN 48* 51* 51* 45* 38*  CREATININE 2.30* 2.37* 2.17* 1.84* 1.47*  CALCIUM 9.7 9.2 9.0 9.1 9.1  MG  --   --  2.1  --   --    GFR: Estimated Creatinine Clearance: 30.9 mL/min (A) (by C-G formula based on SCr of 1.47 mg/dL (H)). Liver Function Tests: Recent Labs  Lab 02/23/18 1518  AST 21  ALT 14  ALKPHOS 56  BILITOT 0.4  PROT 7.3  ALBUMIN 3.2*   No results for input(s): LIPASE, AMYLASE in the last 168 hours. No results for input(s): AMMONIA in the last 168 hours. Coagulation Profile: No results for input(s): INR, PROTIME in the last 168 hours. Cardiac Enzymes: No results for input(s): CKTOTAL, CKMB, CKMBINDEX, TROPONINI in the last 168 hours. BNP (last 3  results) No results for input(s): PROBNP in the last 8760 hours. HbA1C: No results for input(s): HGBA1C in the last 72 hours. CBG: No results for input(s): GLUCAP in the last 168 hours. Lipid Profile: No results for input(s): CHOL, HDL, LDLCALC, TRIG, CHOLHDL, LDLDIRECT in the last 72 hours. Thyroid Function Tests: No results for input(s): TSH, T4TOTAL, FREET4, T3FREE, THYROIDAB in the last 72 hours. Anemia Panel: No results for input(s): VITAMINB12, FOLATE, FERRITIN, TIBC, IRON, RETICCTPCT in the last 72 hours. Sepsis Labs: Recent Labs  Lab 02/23/18 1605  LATICACIDVEN 0.63    Recent Results (from the past 240 hour(s))  MRSA PCR Screening     Status: None   Collection Time: 02/23/18  2:49 PM  Result Value Ref Range Status   MRSA by PCR NEGATIVE NEGATIVE Final    Comment:        The GeneXpert MRSA Assay (FDA approved for NASAL specimens only), is one component of a comprehensive MRSA colonization surveillance program. It is not intended to diagnose MRSA infection nor to guide or monitor treatment for MRSA infections. Performed at [facility], [address] [place], [place]  [address]   Blood culture (routine x 2)     Status: None (Preliminary result)   Collection Time: 02/23/18  3:23 PM  Result Value Ref Range Status   Specimen Description   Final    BLOOD RIGHT ANTECUBITAL Performed at [facility] [address] [place], [place] [address]    Special Requests   Final    BOTTLES DRAWN AEROBIC AND ANAEROBIC Blood Culture adequate volume Performed at [facility] [address], [place], [place] [address]    Culture   Final    NO GROWTH 4 DAYS Performed at [facility], [place] [address] [place], [place] [address]    Report Status PENDING  Incomplete  Urine culture     Status: Abnormal   Collection Time: 02/23/18  9:29 PM  Result Value Ref Range Status   Specimen Description   Final    URINE,  RANDOM Performed at [facility] [address], [place], [place] [address]    Special Requests   Final    NONE Performed at [facility], [place] [address] [place], [place] [address]    Culture >=100,000 COLONIES/mL PROVIDENCIA RETTGERI (A)  Final   Report Status 02/26/2018 FINAL  Final   Organism ID, Bacteria PROVIDENCIA RETTGERI (A)  Final      Susceptibility   Providencia rettgeri - MIC*    AMPICILLIN RESISTANT Resistant     CEFAZOLIN >=64 RESISTANT Resistant     CEFTRIAXONE <=1 SENSITIVE Sensitive     CIPROFLOXACIN <=0.25 SENSITIVE Sensitive     GENTAMICIN <=1 SENSITIVE Sensitive     IMIPENEM 1 SENSITIVE Sensitive     NITROFURANTOIN 128 RESISTANT Resistant     TRIMETH/SULFA <=20 SENSITIVE Sensitive     AMPICILLIN/SULBACTAM 16 INTERMEDIATE Intermediate     PIP/TAZO <=4 SENSITIVE Sensitive     * >=100,000 COLONIES/mL PROVIDENCIA RETTGERI  Blood culture (routine x 2)     Status: None (Preliminary result)   Collection Time: 02/24/18  3:33 AM  Result Value Ref Range Status   Specimen Description   Final    BLOOD RIGHT HAND Performed at [facility] [address] [place],  [address]    Special Requests   Final    BOTTLES DRAWN AEROBIC AND ANAEROBIC Blood Culture adequate volume Performed at [facility] [address] [place],  [address]    Culture   Final    NO GROWTH 3 DAYS Performed at [facility], [place] [address], [place],  [address]    Report Status PENDING  Incomplete         Radiology Studies: No results found.      Scheduled Meds: . amitriptyline  50 mg Oral QHS  . aspirin EC  81 mg Oral Q breakfast  . calcium-vitamin D  1 tablet Oral Q breakfast  . diltiazem  120 mg Oral Q breakfast  . docusate sodium  100 mg Oral Daily  . escitalopram  10 mg Oral Daily  . famotidine  20 mg Oral Q1500  . feeding supplement (ENSURE ENLIVE)  237 mL Oral TID WC   . fentaNYL  25 mcg Transdermal Q72H  . ferrous sulfate  325 mg Oral BID WC  . heparin  5,000 Units Subcutaneous Q8H  . hydrALAZINE  50 mg Oral QID  . levothyroxine  175 mcg Oral QAC breakfast  . loratadine  10 mg Oral Daily  . multivitamin with minerals  1  tablet Oral Daily  . oxyCODONE  5 mg Oral Q4H  . pantoprazole  40 mg Oral Daily  . polyethylene glycol  17 g Oral BID  . senna-docusate  2 tablet Oral BID  . vitamin C  500 mg Oral Daily   Continuous Infusions: . sodium chloride 125 mL/hr at 02/27/18 1119  . sodium chloride    . cefTRIAXone (ROCEPHIN)  IV Stopped (02/27/18 1107)  . clindamycin (CLEOCIN) IV Stopped (02/27/18 0848)     LOS: 4 days    Time spent: [place], MD [facility]   If 7PM-7AM, please contact night-coverage [email or web address] Password [facility] 02/27/2018, 12:42 PM

## 2018-02-27 NOTE — Progress Notes (Signed)
CSW followed up with patient to discuss treatment plans and discharge planning further, patient accompanied by her husband. CSW asked patient how she was feeling patient replied "uhhh", CSW validated patient's feelings. Patient reported that "future plans are up in the air". Patient reported that if there are no treatment options available to treat her left leg that she is open to amputation. CSW and patient discussed PT recommendation for SNF. Patient reported that if she has to have her left leg amputated that she is not sure if rehab will be beneficial. CSW agreed to continue to follow and assist patient with discharge planning, as treatment plan is clarified and patient's care needs are established after treatment of left leg.   CSW will continue to follow and assist with discharge planning.   Abundio Miu, [place] Social Worker [facility] Cell#: [phone]

## 2018-02-27 NOTE — Evaluation (Signed)
Physical Therapy Evaluation Patient Details Name: Shelby Reese MRN: [id number] DOB: Mar 09, 1935 Today's Date: 02/27/2018   History of Present Illness  83 year old with past medical history relevant for peripheral arterial disease status post right AKA on 12/15/2017, bilateral severe chronic venous stasis, hypertension, iron deficiency anemia, hypothyroidism admitted with acute on chronic anemia with guaiac positive stools and acute kidney injury in the setting of worsening left lower extremity ulcer and infection.  Clinical Impression  Pt admitted with above diagnosis. Pt currently with functional limitations due to the deficits listed below (see PT Problem List). Mod assist for supine to sit, pt sat on edge of bed x 15 minutes. Performed LLE exercises.  Pt will benefit from skilled PT to increase their independence and safety with mobility to allow discharge to the venue listed below.       Follow Up Recommendations SNF;Other (comment) (consider CIR consult if pt has LLE amputation)    Equipment Recommendations  None recommended by PT    Recommendations for Other Services       Precautions / Restrictions Precautions Precautions: Fall Precaution Comments: recent R AKA Restrictions Weight Bearing Restrictions: No      Mobility  Bed Mobility Overal bed mobility: Needs Assistance Bed Mobility: Supine to Sit     Supine to sit: HOB elevated     General bed mobility comments: with HOB fully elevated, pt able to advance LLE to left edge of bed with PT moving pillow along with it, mod A to pivot hip and raise trunk to full upright position, pt sat on edge of bed 15 min with LLE supported, no loss of balance, pt left sitting to eat her meal, she will call nursing to assist her back to supine  Transfers                    Ambulation/Gait                Stairs            Wheelchair Mobility    Modified Rankin (Stroke Patients Only)       Balance Overall  balance assessment: Needs assistance Sitting-balance support: Feet supported;No upper extremity supported Sitting balance-Leahy Scale: Good                                       Pertinent Vitals/Pain Pain Assessment: 0-10 Pain Score: 4  Pain Location: L lower leg Pain Descriptors / Indicators: Sore Pain Intervention(s): Monitored during session;Limited activity within patient's tolerance;Premedicated before session    Home Living Family/patient expects to be discharged to:: Skilled nursing facility                      Prior Function Level of Independence: Needs assistance   Gait / Transfers Assistance Needed: since amputation January 2019 pt has been doing sliding board transfers to [facility] at ALF     Comments: Retired Therapist, sports.     Hand Dominance   Dominant Hand: Right    Extremity/Trunk Assessment   Upper Extremity Assessment Upper Extremity Assessment: Overall WFL for tasks assessed    Lower Extremity Assessment Lower Extremity Assessment: LLE deficits/detail LLE Deficits / Details: toes numb to light touch, lower leg decreased sensation to light touch, intact at thigh; knee ext 3/5 at least, hip ABD 3/5, ankle DF +2/5 LLE: Unable to fully assess due to pain LLE Sensation:  decreased light touch    Cervical / Trunk Assessment Cervical / Trunk Assessment: Normal  Communication   Communication: No difficulties  Cognition Arousal/Alertness: Awake/alert Behavior During Therapy: WFL for tasks assessed/performed Overall Cognitive Status: Within Functional Limits for tasks assessed                                        General Comments      Exercises Total Joint Exercises Ankle Circles/Pumps: AROM;Left;10 reps;Supine Long Arc Quad: AROM;10 reps;Left;Seated   Assessment/Plan    PT Assessment Patient needs continued PT services  PT Problem List Decreased strength;Decreased range of motion;Decreased activity tolerance;Decreased  mobility       PT Treatment Interventions Functional mobility training;Therapeutic activities;Therapeutic exercise;Patient/family education;Balance training    PT Goals (Current goals can be found in the Care Plan section)  Acute Rehab PT Goals Patient Stated Goal: be able to transfer to [facility] PT Goal Formulation: With patient Time For Goal Achievement: 03/13/18 Potential to Achieve Goals: Fair    Frequency Min 3X/week   Barriers to discharge        Co-evaluation               AM-PAC PT "6 Clicks" Daily Activity  Outcome Measure Difficulty turning over in bed (including adjusting bedclothes, sheets and blankets)?: Unable Difficulty moving from lying on back to sitting on the side of the bed? : Unable Difficulty sitting down on and standing up from a chair with arms (e.g., wheelchair, bedside commode, etc,.)?: Unable Help needed moving to and from a bed to chair (including a wheelchair)?: Total Help needed walking in hospital room?: Total Help needed climbing 3-5 steps with a railing? : Total 6 Click Score: 6    End of Session   Activity Tolerance: Patient tolerated treatment well Patient left: in bed;with call bell/phone within reach Nurse Communication: Mobility status PT Visit Diagnosis: Muscle weakness (generalized) (M62.81);Pain Pain - Right/Left: Left Pain - part of body: Ankle and joints of foot;Leg    Time: 5701-7793 PT Time Calculation (min) (ACUTE ONLY): 29 min   Charges:   PT Evaluation $PT Eval Moderate Complexity: 1 Mod PT Treatments $Therapeutic Activity: 8-22 mins   PT G Codes:          Philomena Doheny 02/27/2018, 12:59 PM [phone]

## 2018-02-28 LAB — BASIC METABOLIC PANEL WITH GFR
BUN: 26 mg/dL — ABNORMAL HIGH (ref 6–20)
CO2: 21 mmol/L — ABNORMAL LOW (ref 22–32)
Calcium: 9.1 mg/dL (ref 8.9–10.3)
Chloride: 106 mmol/L (ref 101–111)
Creatinine, Ser: 1.13 mg/dL — ABNORMAL HIGH (ref 0.44–1.00)
Glucose, Bld: 80 mg/dL (ref 65–99)

## 2018-02-28 LAB — BASIC METABOLIC PANEL
Anion gap: 8 (ref 5–15)
GFR calc Af Amer: 52 mL/min — ABNORMAL LOW (ref >60)
GFR calc non Af Amer: 45 mL/min — ABNORMAL LOW (ref >60)
Potassium: 4.4 mmol/L (ref 3.5–5.1)
Sodium: 135 mmol/L (ref 135–145)

## 2018-02-28 LAB — CULTURE, BLOOD (ROUTINE X 2)
CULTURE: NO GROWTH
SPECIAL REQUESTS: ADEQUATE

## 2018-03-01 LAB — CULTURE, BLOOD (ROUTINE X 2)
Culture: NO GROWTH
SPECIAL REQUESTS: ADEQUATE

## 2018-03-01 NOTE — Progress Notes (Signed)
PROGRESS NOTE    Shelby Reese  PPJ:[id number] DOB: 1935/07/08 DOA: 02/23/2018 PCP: [name], MD    Brief Narrative:  83 year old with past medical history relevant for peripheral arterial disease status post right AKA on 12/15/2017, bilateral severe chronic venous stasis, hypertension, iron deficiency anemia, hypothyroidism admitted with acute on chronic anemia with guaiac positive stools and acute kidney injury in the setting of worsening left lower extremity ulcer and infection.   Assessment & Plan:   Active Problems:   Hypothyroidism, acquired   Iron deficiency anemia   Atherosclerosis of artery of right lower extremity (HCC)   Essential hypertension   CKD (chronic kidney disease), stage III ([place])   Peripheral vascular disease (HCC)   Anemia   Foot ulcer ([place])  #) Left lower extremity infection:.   -IV ceftriaxone 2 g and clindamycin started 02/23/2018 - blood culture x2 ordered 02/23/2018 negative - ABIs and MRI could not be tolerated due to pain -Vascular surgery consult, pending transfer to [facility] for arteriogram once creatinine stabilizes  #)  chronic anemia: Patient was seen by GI and apparently refused all further evaluation. -We will transfuse as needed per patient's request -Continue PPI -Continue oral iron supplementation  #) Acute on chronic kidney injury: Almost completely to baseline.  Renal ultrasound on 02/25/2018 was normal -Hold home benazepril -IV fluids discontinued today -Avoid nephrotoxins  #) Peripheral arterial disease: Patient carries this diagnosis however no ABIs in the chart. -ABIs could not be tolerated -Vascular surgery consult per above -Continue aspirin 81 mg daily  #) Hypertension: -Hold home benazepril 40 mg in setting of a KI -continue diltiazem 120 mg daily -Continue hydralazine 50 mg 4 times daily -Hold home furosemide 20 mg daily  #)chronic venous stasis: Likely contributing to her nonhealing ulcer. - Hold home furosemide  #)  Hypothyroidism: -Continue levothyroxine 175 mcg daily  #) Pain/psych: -Continue fentanyl patch 25 mcg every 3 days -Continue escitalopram 10 mg daily -Continue amitriptyline 50 mg nightly -Continue PRN oxycodone -Continue PRN methocarbamol 500 mg every 6 hours  Fluids: Tolerating p.o. Elect lites: Monitor and supplement Nutrition: Heart healthy diet  Prophylaxis: Heparin   Disposition: Pending evaluation of limb salvage ability and placement  Full code   Consultants:   Gastroenterology  Vascular surgery  Procedures: (Don't include imaging studies which can be auto populated. Include things that cannot be auto populated i.e. Echo, Carotid and venous dopplers, Foley, Bipap, HD, tubes/drains, wound vac, central lines etc) 02/25/2018 renal ultrasound:IMPRESSION:  Unremarkable sonographic appearance of the kidneys for age.  Antimicrobials: (specify start and planned stop date. Auto populated tables are space occupying and do not give end dates)  IV ceftriaxone and clindamycin started 02/24/2018   Subjective: Patient reports that she feels well.  She does not have any complaints.  She is eager to see what her kidney function is tomorrow so that she can be transferred to [facility]. Objective: Vitals:   02/28/18 0509 02/28/18 1314 02/28/18 2025 03/01/18 0605  BP: (!) 123/48 136/61 (!) 154/52 (!) 151/57  Pulse: 74 77 86 79  Resp: 20 18 18 15   Temp: 97.9 F (36.6 C) 97.7 F (36.5 C) 98.3 F (36.8 C) 98.6 F (37 C)  TempSrc: Oral Oral Oral Oral  SpO2: 97% 95% 97% 96%  Weight:      Height:        Intake/Output Summary (Last 24 hours) at 03/01/2018 1052 Last data filed at 03/01/2018 0600 Gross per 24 hour  Intake 1950 ml  Output 1150 ml  Net 800 ml   Filed Weights   02/23/18 2100 02/24/18 0600  Weight: 81.3 kg (179 lb 3.7 oz) 81.9 kg (180 lb 8.9 oz)    Examination:  General exam: Appears calm and comfortable  Respiratory system: Clear to auscultation. Respiratory  effort normal. Cardiovascular system: Distant heart sounds, no murmurs Gastrointestinal system: Soft, nondistended, nontender, plus bowel sounds neuro: Alert and oriented. No focal neurological deficits. Extremities: Right AKA, left chronic venous stasis changes with no pulses felt Skin: Dressed, chronic lower extremity venous changes noted Psychiatry: Judgement and insight appear normal. Mood & affect appropriate.     Data Reviewed: I have personally reviewed following labs and imaging studies  CBC: Recent Labs  Lab 02/23/18 1518 02/24/18 0333 02/25/18 0527  WBC 9.9 7.2 6.3  NEUTROABS 7.2  --   --   HGB 6.9* 8.2* 7.9*  HCT 22.8* 26.7* 25.4*  MCV 87.0 86.1 85.8  PLT 435* 338 678   Basic Metabolic Panel: Recent Labs  Lab 02/24/18 0333 02/25/18 0527 02/26/18 0504 02/27/18 0533 02/28/18 0457  NA 136 136 135 135 135  K 4.8 4.7 4.8 4.7 4.4  CL 101 104 101 104 106  CO2 25 23 23 22  21*  GLUCOSE 110* 86 88 86 80  BUN 51* 51* 45* 38* 26*  CREATININE 2.37* 2.17* 1.84* 1.47* 1.13*  CALCIUM 9.2 9.0 9.1 9.1 9.1  MG  --  2.1  --   --   --    GFR: Estimated Creatinine Clearance: 40.2 mL/min (A) (by C-G formula based on SCr of 1.13 mg/dL (H)). Liver Function Tests: Recent Labs  Lab 02/23/18 1518  AST 21  ALT 14  ALKPHOS 56  BILITOT 0.4  PROT 7.3  ALBUMIN 3.2*   No results for input(s): LIPASE, AMYLASE in the last 168 hours. No results for input(s): AMMONIA in the last 168 hours. Coagulation Profile: No results for input(s): INR, PROTIME in the last 168 hours. Cardiac Enzymes: No results for input(s): CKTOTAL, CKMB, CKMBINDEX, TROPONINI in the last 168 hours. BNP (last 3 results) No results for input(s): PROBNP in the last 8760 hours. HbA1C: No results for input(s): HGBA1C in the last 72 hours. CBG: No results for input(s): GLUCAP in the last 168 hours. Lipid Profile: No results for input(s): CHOL, HDL, LDLCALC, TRIG, CHOLHDL, LDLDIRECT in the last 72  hours. Thyroid Function Tests: No results for input(s): TSH, T4TOTAL, FREET4, T3FREE, THYROIDAB in the last 72 hours. Anemia Panel: No results for input(s): VITAMINB12, FOLATE, FERRITIN, TIBC, IRON, RETICCTPCT in the last 72 hours. Sepsis Labs: Recent Labs  Lab 02/23/18 1605  LATICACIDVEN 0.63    Recent Results (from the past 240 hour(s))  MRSA PCR Screening     Status: None   Collection Time: 02/23/18  2:49 PM  Result Value Ref Range Status   MRSA by PCR NEGATIVE NEGATIVE Final    Comment:        The GeneXpert MRSA Assay (FDA approved for NASAL specimens only), is one component of a comprehensive MRSA colonization surveillance program. It is not intended to diagnose MRSA infection nor to guide or monitor treatment for MRSA infections. Performed at [facility], [place] [address], [place], [place] [address]   Blood culture (routine x 2)     Status: None   Collection Time: 02/23/18  3:23 PM  Result Value Ref Range Status   Specimen Description   Final    BLOOD RIGHT ANTECUBITAL Performed at [facility]  [address] [place], [place] [address]    Special Requests   Final    BOTTLES DRAWN AEROBIC AND ANAEROBIC Blood Culture adequate volume Performed at [place] [address] [place], [place] [address]    Culture   Final    NO GROWTH 5 DAYS Performed at [facility], [place] [address], [place], [place] [address]    Report Status 02/28/2018 FINAL  Final  Urine culture     Status: Abnormal   Collection Time: 02/23/18  9:29 PM  Result Value Ref Range Status   Specimen Description   Final    URINE, RANDOM Performed at [place] [address] [place], [place] [address]    Special Requests   Final    NONE Performed at [facility], [place] [address] [place], [place] [address]    Culture >=100,000 COLONIES/mL PROVIDENCIA RETTGERI (A)  Final   Report Status  02/26/2018 FINAL  Final   Organism ID, Bacteria PROVIDENCIA RETTGERI (A)  Final      Susceptibility   Providencia rettgeri - MIC*    AMPICILLIN RESISTANT Resistant     CEFAZOLIN >=64 RESISTANT Resistant     CEFTRIAXONE <=1 SENSITIVE Sensitive     CIPROFLOXACIN <=0.25 SENSITIVE Sensitive     GENTAMICIN <=1 SENSITIVE Sensitive     IMIPENEM 1 SENSITIVE Sensitive     NITROFURANTOIN 128 RESISTANT Resistant     TRIMETH/SULFA <=20 SENSITIVE Sensitive     AMPICILLIN/SULBACTAM 16 INTERMEDIATE Intermediate     PIP/TAZO <=4 SENSITIVE Sensitive     * >=100,000 COLONIES/mL PROVIDENCIA RETTGERI  Blood culture (routine x 2)     Status: None (Preliminary result)   Collection Time: 02/24/18  3:33 AM  Result Value Ref Range Status   Specimen Description   Final    BLOOD RIGHT HAND Performed at [place] [address] [place], [place] [address]    Special Requests   Final    BOTTLES DRAWN AEROBIC AND ANAEROBIC Blood Culture adequate volume Performed at [place] [address], [place], [place] [address]    Culture   Final    NO GROWTH 4 DAYS Performed at [facility], [place] [address] [place], [place] [address]    Report Status PENDING  Incomplete         Radiology Studies: No results found.      Scheduled Meds: . amitriptyline  50 mg Oral QHS  . aspirin EC  81 mg Oral Q breakfast  . calcium-vitamin D  1 tablet Oral Q breakfast  . diltiazem  120 mg Oral Q breakfast  . docusate sodium  100 mg Oral Daily  . escitalopram  10 mg Oral Daily  . famotidine  20 mg Oral Q1500  . feeding supplement (ENSURE ENLIVE)  237 mL Oral TID WC  . fentaNYL  25 mcg Transdermal Q72H  . ferrous sulfate  325 mg Oral BID WC  . heparin  5,000 Units Subcutaneous Q8H  . hydrALAZINE  50 mg Oral QID  . levothyroxine  175 mcg Oral QAC breakfast  . loratadine  10 mg Oral Daily  . multivitamin with minerals  1 tablet Oral Daily  . oxyCODONE  5 mg Oral Q4H   . pantoprazole  40 mg Oral Daily  . polyethylene glycol  17 g Oral BID  . senna-docusate  2 tablet Oral BID  . vitamin C  500 mg Oral Daily   Continuous Infusions: . sodium  chloride    . cefTRIAXone (ROCEPHIN)  IV 2 g (03/01/18 2334)  . clindamycin (CLEOCIN) IV Stopped (03/01/18 0945)     LOS: 6 days    Time spent: [place], MD [facility]   If 7PM-7AM, please contact night-coverage [email or web address] Password [facility] 03/01/2018, 10:52 AM

## 2018-03-02 DIAGNOSIS — I7090 Unspecified atherosclerosis: Secondary | ICD-10-CM | POA: Diagnosis present

## 2018-03-02 LAB — BASIC METABOLIC PANEL
Chloride: 105 mmol/L (ref 101–111)
Creatinine, Ser: 1.08 mg/dL — ABNORMAL HIGH (ref 0.44–1.00)
GFR calc Af Amer: 55 mL/min — ABNORMAL LOW (ref >60)
GFR calc non Af Amer: 47 mL/min — ABNORMAL LOW (ref >60)
Sodium: 136 mmol/L (ref 135–145)

## 2018-03-02 LAB — BASIC METABOLIC PANEL WITH GFR
Anion gap: 10 (ref 5–15)
BUN: 17 mg/dL (ref 6–20)
CO2: 21 mmol/L — ABNORMAL LOW (ref 22–32)
Calcium: 9.9 mg/dL (ref 8.9–10.3)
Glucose, Bld: 88 mg/dL (ref 65–99)
Potassium: 4.4 mmol/L (ref 3.5–5.1)

## 2018-03-02 MED ORDER — HYDROMORPHONE HCL 2 MG/ML IJ SOLN: 0.5000 mg | INTRAMUSCULAR | Status: DC | PRN

## 2018-03-02 NOTE — Progress Notes (Signed)
PROGRESS NOTE    Shelby Reese  IRC:[id number] DOB: 09-Jun-1935 DOA: 02/23/2018 PCP: [name], MD    Brief Narrative:  83 year old with past medical history relevant for peripheral arterial disease status post right AKA on 12/15/2017, bilateral severe chronic venous stasis, hypertension, iron deficiency anemia, hypothyroidism admitted with acute on chronic anemia with guaiac positive stools and acute kidney injury in the setting of worsening left lower extremity ulcer and infection.  She is being transferred to [facility] today for arteriogram after renal function is stabilized.   Assessment & Plan:   Active Problems:   Hypothyroidism, acquired   Iron deficiency anemia   Atherosclerosis of artery of right lower extremity (HCC)   Essential hypertension   CKD (chronic kidney disease), stage III ([place])   Peripheral vascular disease (HCC)   Anemia   Foot ulcer ([place])  #) Left lower extremity infection:.   -IV ceftriaxone 2 g and clindamycin started 02/23/2018 - blood culture x2 ordered 02/23/2018 negative - ABIs and MRI could not be tolerated due to pain -Vascular surgery consult, pending transfer to [facility] today for arteriogram once creatinine stabilizes  #)  chronic anemia: Patient was seen by GI and apparently refused all further evaluation. -We will transfuse as needed per patient's request -Continue PPI -Continue oral iron supplementation  #) Acute on chronic kidney injury: Resolved -Hold home benazepril  -Normal renal ultrasound on 02/25/2018  #) Peripheral arterial disease: Patient carries this diagnosis however no ABIs in the chart. -ABIs could not be tolerated -Vascular surgery consult per above -Continue aspirin 81 mg daily  #) Hypertension: -Hold home benazepril 40 mg in setting of a KI -continue diltiazem 120 mg daily -Continue hydralazine 50 mg 4 times daily -Hold home furosemide 20 mg daily  #)chronic venous stasis: Likely contributing to her nonhealing ulcer. - Hold  home furosemide  #) Hypothyroidism: -Continue levothyroxine 175 mcg daily  #) Pain/psych: -Continue fentanyl patch 25 mcg every 3 days -Continue escitalopram 10 mg daily -Continue amitriptyline 50 mg nightly -Continue PRN oxycodone -Continue PRN methocarbamol 500 mg every 6 hours  Fluids: Tolerating p.o. Elect lites: Monitor and supplement Nutrition: Heart healthy diet  Prophylaxis: Heparin   Disposition: Pending evaluation of limb salvage ability and placement  Full code   Consultants:   Gastroenterology  Vascular surgery  Procedures: (Don't include imaging studies which can be auto populated. Include things that cannot be auto populated i.e. Echo, Carotid and venous dopplers, Foley, Bipap, HD, tubes/drains, wound vac, central lines etc) 02/25/2018 renal ultrasound:IMPRESSION:  Unremarkable sonographic appearance of the kidneys for age.  Antimicrobials: (specify start and planned stop date. Auto populated tables are space occupying and do not give end dates)  IV ceftriaxone and clindamycin started 02/24/2018   Subjective: Patient reports that she feels well.  She is eager to be transferred today for an arteriogram.  Objective: Vitals:   03/01/18 0605 03/01/18 1326 03/01/18 2223 03/02/18 0514  BP: (!) 151/57 (!) 151/59 (!) 164/63 (!) 160/58  Pulse: 79 79 81 79  Resp: 15 19 16 20   Temp: 98.6 F (37 C)  98.5 F (36.9 C) 98.5 F (36.9 C)  TempSrc: Oral  Oral Oral  SpO2: 96% 97% 95% 95%  Weight:      Height:        Intake/Output Summary (Last 24 hours) at 03/02/2018 1034 Last data filed at 03/02/2018 5102 Gross per 24 hour  Intake 390 ml  Output 2600 ml  Net -2210 ml   Filed Weights   02/23/18  2100 02/24/18 0600  Weight: 81.3 kg (179 lb 3.7 oz) 81.9 kg (180 lb 8.9 oz)    Examination:  General exam: Appears calm and comfortable  Respiratory system: Clear to auscultation. Respiratory effort normal. Cardiovascular system: Distant heart sounds, no  murmurs Gastrointestinal system: Soft, nondistended, nontender, plus bowel sounds neuro: Alert and oriented. No focal neurological deficits. Extremities: Right AKA, left chronic venous stasis changes with no pulses felt Skin: Dressed, chronic lower extremity venous changes noted Psychiatry: Judgement and insight appear normal. Mood & affect appropriate.     Data Reviewed: I have personally reviewed following labs and imaging studies  CBC: Recent Labs  Lab 02/23/18 1518 02/24/18 0333 02/25/18 0527  WBC 9.9 7.2 6.3  NEUTROABS 7.2  --   --   HGB 6.9* 8.2* 7.9*  HCT 22.8* 26.7* 25.4*  MCV 87.0 86.1 85.8  PLT 435* 338 419   Basic Metabolic Panel: Recent Labs  Lab 02/25/18 0527 02/26/18 0504 02/27/18 0533 02/28/18 0457 03/02/18 0528  NA 136 135 135 135 136  K 4.7 4.8 4.7 4.4 4.4  CL 104 101 104 106 105  CO2 23 23 22  21* 21*  GLUCOSE 86 88 86 80 88  BUN 51* 45* 38* 26* 17  CREATININE 2.17* 1.84* 1.47* 1.13* 1.08*  CALCIUM 9.0 9.1 9.1 9.1 9.9  MG 2.1  --   --   --   --    GFR: Estimated Creatinine Clearance: 42.1 mL/min (A) (by C-G formula based on SCr of 1.08 mg/dL (H)). Liver Function Tests: Recent Labs  Lab 02/23/18 1518  AST 21  ALT 14  ALKPHOS 56  BILITOT 0.4  PROT 7.3  ALBUMIN 3.2*   No results for input(s): LIPASE, AMYLASE in the last 168 hours. No results for input(s): AMMONIA in the last 168 hours. Coagulation Profile: No results for input(s): INR, PROTIME in the last 168 hours. Cardiac Enzymes: No results for input(s): CKTOTAL, CKMB, CKMBINDEX, TROPONINI in the last 168 hours. BNP (last 3 results) No results for input(s): PROBNP in the last 8760 hours. HbA1C: No results for input(s): HGBA1C in the last 72 hours. CBG: No results for input(s): GLUCAP in the last 168 hours. Lipid Profile: No results for input(s): CHOL, HDL, LDLCALC, TRIG, CHOLHDL, LDLDIRECT in the last 72 hours. Thyroid Function Tests: No results for input(s): TSH, T4TOTAL, FREET4,  T3FREE, THYROIDAB in the last 72 hours. Anemia Panel: No results for input(s): VITAMINB12, FOLATE, FERRITIN, TIBC, IRON, RETICCTPCT in the last 72 hours. Sepsis Labs: Recent Labs  Lab 02/23/18 1605  LATICACIDVEN 0.63    Recent Results (from the past 240 hour(s))  MRSA PCR Screening     Status: None   Collection Time: 02/23/18  2:49 PM  Result Value Ref Range Status   MRSA by PCR NEGATIVE NEGATIVE Final    Comment:        The GeneXpert MRSA Assay (FDA approved for NASAL specimens only), is one component of a comprehensive MRSA colonization surveillance program. It is not intended to diagnose MRSA infection nor to guide or monitor treatment for MRSA infections. Performed at [facility], [place] [address] [place], [place] [address]   Blood culture (routine x 2)     Status: None   Collection Time: 02/23/18  3:23 PM  Result Value Ref Range Status   Specimen Description   Final    BLOOD RIGHT ANTECUBITAL Performed at [place] [address] [place], [place] [address]    Special Requests  Final    BOTTLES DRAWN AEROBIC AND ANAEROBIC Blood Culture adequate volume Performed at [place] [address], [place], [place] [address]    Culture   Final    NO GROWTH 5 DAYS Performed at [facility], [place] [address] [place], [place] [address]    Report Status 02/28/2018 FINAL  Final  Urine culture     Status: Abnormal   Collection Time: 02/23/18  9:29 PM  Result Value Ref Range Status   Specimen Description   Final    URINE, RANDOM Performed at [place] [address] [place], [place] [address]    Special Requests   Final    NONE Performed at [facility], [place] [address], [place], [place] [address]    Culture >=100,000 COLONIES/mL PROVIDENCIA RETTGERI (A)  Final   Report Status 02/26/2018 FINAL  Final   Organism ID, Bacteria PROVIDENCIA RETTGERI (A)  Final       Susceptibility   Providencia rettgeri - MIC*    AMPICILLIN RESISTANT Resistant     CEFAZOLIN >=64 RESISTANT Resistant     CEFTRIAXONE <=1 SENSITIVE Sensitive     CIPROFLOXACIN <=0.25 SENSITIVE Sensitive     GENTAMICIN <=1 SENSITIVE Sensitive     IMIPENEM 1 SENSITIVE Sensitive     NITROFURANTOIN 128 RESISTANT Resistant     TRIMETH/SULFA <=20 SENSITIVE Sensitive     AMPICILLIN/SULBACTAM 16 INTERMEDIATE Intermediate     PIP/TAZO <=4 SENSITIVE Sensitive     * >=100,000 COLONIES/mL PROVIDENCIA RETTGERI  Blood culture (routine x 2)     Status: None   Collection Time: 02/24/18  3:33 AM  Result Value Ref Range Status   Specimen Description   Final    BLOOD RIGHT HAND Performed at [place] [address], [place], [place] [address]    Special Requests   Final    BOTTLES DRAWN AEROBIC AND ANAEROBIC Blood Culture adequate volume Performed at [place] [address], [place], [place] [address]    Culture   Final    NO GROWTH 5 DAYS Performed at [facility], [place] [address] [place], [place] [address]    Report Status 03/01/2018 FINAL  Final         Radiology Studies: No results found.      Scheduled Meds: . amitriptyline  50 mg Oral QHS  . aspirin EC  81 mg Oral Q breakfast  . calcium-vitamin D  1 tablet Oral Q breakfast  . diltiazem  120 mg Oral Q breakfast  . docusate sodium  100 mg Oral Daily  . escitalopram  10 mg Oral Daily  . famotidine  20 mg Oral Q1500  . feeding supplement (ENSURE ENLIVE)  237 mL Oral TID WC  . fentaNYL  25 mcg Transdermal Q72H  . ferrous sulfate  325 mg Oral BID WC  . heparin  5,000 Units Subcutaneous Q8H  . hydrALAZINE  50 mg Oral QID  . levothyroxine  175 mcg Oral QAC breakfast  . loratadine  10 mg Oral Daily  . multivitamin with minerals  1 tablet Oral Daily  . oxyCODONE  5 mg Oral Q4H  . pantoprazole  40 mg Oral Daily  . polyethylene glycol  17 g Oral BID  . senna-docusate  2 tablet  Oral BID  . vitamin C  500 mg Oral Daily   Continuous Infusions: . sodium chloride    . cefTRIAXone (ROCEPHIN)  IV Stopped (03/01/18 1008)  .  clindamycin (CLEOCIN) IV Stopped (03/02/18 0130)     LOS: 7 days    Time spent: [place], MD [facility]   If 7PM-7AM, please contact night-coverage [email or web address] Password [facility] 03/02/2018, 10:34 AM

## 2018-03-02 NOTE — Progress Notes (Signed)
PT Cancellation Note  Patient Details Name: SAVERA DONSON MRN: [id number] DOB: 08-Nov-1935   Cancelled Treatment:    Reason Eval/Treat Not Completed: Other (comment)(plan for transfer to [facility] today)   Jazminn Pomales,KATHrine E 03/02/2018, 2:57 PM Carmelia Bake, PT, DPT 03/02/2018 Pager: [phone]

## 2018-03-02 NOTE — Care Management Important Message (Signed)
Important Message  Patient Details  Name: Shelby Reese MRN: [id number] Date of Birth: Apr 17, 1935   Medicare Important Message Given:  Yes    Kerin Salen 03/02/2018, 1:02 [place] Message  Patient Details  Name: Shelby Reese MRN: [id number] Date of Birth: 1935/10/28   Medicare Important Message Given:  Yes    Kerin Salen 03/02/2018, 1:02 PM

## 2018-03-02 NOTE — Progress Notes (Signed)
Received patient from [facility] via [place]. Pt AOx4, with right AKA and kerlex dressing on left foot, VS stable and pain at 2/10, text paged floor coverage [facility] for info and transfer orders reconciliation. Pt oriented to room, bed controls and call light, now resting on bed comfortably.  Will monitor.

## 2018-03-03 ENCOUNTER — Encounter (HOSPITAL_COMMUNITY)
Admission: EM | Disposition: A | Discharge status: Home Health | Payer: Self-pay | Source: Home / Self Care | Attending: Internal Medicine

## 2018-03-03 DIAGNOSIS — I10 Essential (primary) hypertension: Secondary | ICD-10-CM

## 2018-03-03 DIAGNOSIS — E039 Hypothyroidism, unspecified: Secondary | ICD-10-CM

## 2018-03-03 DIAGNOSIS — L97529 Non-pressure chronic ulcer of other part of left foot with unspecified severity: Secondary | ICD-10-CM

## 2018-03-03 DIAGNOSIS — D649 Anemia, unspecified: Secondary | ICD-10-CM

## 2018-03-03 DIAGNOSIS — I70201 Unspecified atherosclerosis of native arteries of extremities, right leg: Secondary | ICD-10-CM

## 2018-03-03 HISTORY — PX: PERIPHERAL VASCULAR INTERVENTION: CATH118257

## 2018-03-03 HISTORY — PX: LOWER EXTREMITY ANGIOGRAPHY: CATH118251

## 2018-03-03 HISTORY — PX: PERIPHERAL VASCULAR ATHERECTOMY: CATH118256

## 2018-03-03 LAB — CBC WITH DIFFERENTIAL/PLATELET
BASOS PCT: 1 %
Basophils Absolute: 0 10*3/uL (ref 0.0–0.1)
EOS ABS: 0.6 10*3/uL (ref 0.0–0.7)
EOS PCT: 10 %
HCT: 26.3 % — ABNORMAL LOW (ref 36.0–46.0)
Hemoglobin: 8 g/dL — ABNORMAL LOW (ref 12.0–15.0)
LYMPHS ABS: 1.2 10*3/uL (ref 0.7–4.0)
Lymphocytes Relative: 20 %
MCH: 25.8 pg — AB (ref 26.0–34.0)
MCHC: 30.4 g/dL (ref 30.0–36.0)
MCV: 84.8 fL (ref 78.0–100.0)
MONOS PCT: 11 %
Monocytes Absolute: 0.7 10*3/uL (ref 0.1–1.0)
Neutro Abs: 3.6 10*3/uL (ref 1.7–7.7)
Neutrophils Relative %: 58 %
PLATELETS: 309 10*3/uL (ref 150–400)
RBC: 3.1 MIL/uL — ABNORMAL LOW (ref 3.87–5.11)
RDW: 15.9 % — AB (ref 11.5–15.5)
WBC: 6.1 10*3/uL (ref 4.0–10.5)

## 2018-03-03 LAB — BASIC METABOLIC PANEL
Anion gap: 9 (ref 5–15)
BUN: 13 mg/dL (ref 6–20)
CALCIUM: 9.7 mg/dL (ref 8.9–10.3)
CO2: 22 mmol/L (ref 22–32)
CREATININE: 1.06 mg/dL — AB (ref 0.44–1.00)
Chloride: 103 mmol/L (ref 101–111)
GFR calc Af Amer: 56 mL/min — ABNORMAL LOW (ref >60)
GFR, EST NON AFRICAN AMERICAN: 48 mL/min — AB (ref >60)
Glucose, Bld: 78 mg/dL (ref 65–99)
Potassium: 4.3 mmol/L (ref 3.5–5.1)
SODIUM: 134 mmol/L — AB (ref 135–145)

## 2018-03-03 SURGERY — LOWER EXTREMITY ANGIOGRAPHY
Anesthesia: LOCAL

## 2018-03-03 MED ORDER — PROTAMINE SULFATE 10 MG/ML IV SOLN
INTRAVENOUS | Status: DC | PRN
  Administered 2018-03-03: 50 mg via INTRAVENOUS

## 2018-03-03 MED ORDER — HYDRALAZINE HCL 20 MG/ML IJ SOLN
INTRAMUSCULAR | Status: DC | PRN
  Administered 2018-03-03 (×2): 10 mg via INTRAVENOUS

## 2018-03-03 MED ORDER — LABETALOL HCL 5 MG/ML IV SOLN
INTRAVENOUS | Status: DC | PRN
  Administered 2018-03-03: 10 mg via INTRAVENOUS

## 2018-03-03 MED ORDER — PROTAMINE SULFATE 10 MG/ML IV SOLN
INTRAVENOUS | Status: AC
  Filled 2018-03-03: qty 5

## 2018-03-03 MED ORDER — CLOPIDOGREL BISULFATE 75 MG PO TABS
75.0000 mg | ORAL_TABLET | Freq: Every day | ORAL | Status: DC
  Administered 2018-03-04 – 2018-03-09 (×6): 75 mg via ORAL
  Filled 2018-03-03 (×6): qty 1

## 2018-03-03 MED ORDER — ONDANSETRON HCL 4 MG/2ML IJ SOLN
4.0000 mg | Freq: Four times a day (QID) | INTRAMUSCULAR | Status: DC | PRN
  Administered 2018-03-05 – 2018-03-11 (×3): 4 mg via INTRAVENOUS
  Filled 2018-03-03 (×3): qty 2

## 2018-03-03 MED ORDER — LABETALOL HCL 5 MG/ML IV SOLN
10.0000 mg | INTRAVENOUS | Status: DC | PRN
  Administered 2018-03-08: 10 mg via INTRAVENOUS
  Filled 2018-03-03: qty 4

## 2018-03-03 MED ORDER — SODIUM CHLORIDE 0.9 % WEIGHT BASED INFUSION: 1.0000 mL/kg/h | INTRAVENOUS | Status: AC

## 2018-03-03 MED ORDER — FENTANYL CITRATE (PF) 100 MCG/2ML IJ SOLN
INTRAMUSCULAR | Status: DC | PRN
  Administered 2018-03-03 (×2): 25 ug via INTRAVENOUS
  Administered 2018-03-03: 50 ug via INTRAVENOUS

## 2018-03-03 MED ORDER — IODIXANOL 320 MG/ML IV SOLN
INTRAVENOUS | Status: DC | PRN
  Administered 2018-03-03: 110 mL via INTRA_ARTERIAL

## 2018-03-03 MED ORDER — SODIUM CHLORIDE 0.9 % IV SOLN
INTRAVENOUS | Status: DC
  Administered 2018-03-03 – 2018-03-06 (×6): via INTRAVENOUS

## 2018-03-03 MED ORDER — HYDRALAZINE HCL 20 MG/ML IJ SOLN
INTRAMUSCULAR | Status: AC
  Filled 2018-03-03: qty 1

## 2018-03-03 MED ORDER — HYDROMORPHONE HCL 1 MG/ML IJ SOLN: 0.5000 mg | INTRAMUSCULAR | Status: DC | PRN

## 2018-03-03 MED ORDER — MIDAZOLAM HCL 2 MG/2ML IJ SOLN
INTRAMUSCULAR | Status: AC
  Filled 2018-03-03: qty 2

## 2018-03-03 MED ORDER — LIDOCAINE HCL (PF) 1 % IJ SOLN
INTRAMUSCULAR | Status: DC | PRN
  Administered 2018-03-03: 15 mL

## 2018-03-03 MED ORDER — ASPIRIN EC 81 MG PO TBEC
81.0000 mg | DELAYED_RELEASE_TABLET | Freq: Every day | ORAL | Status: DC
  Administered 2018-03-04 – 2018-03-09 (×6): 81 mg via ORAL
  Filled 2018-03-03 (×6): qty 1

## 2018-03-03 MED ORDER — LABETALOL HCL 5 MG/ML IV SOLN
INTRAVENOUS | Status: AC
  Filled 2018-03-03: qty 4

## 2018-03-03 MED ORDER — MIDAZOLAM HCL 2 MG/2ML IJ SOLN
INTRAMUSCULAR | Status: DC | PRN
  Administered 2018-03-03: 1 mg via INTRAVENOUS
  Administered 2018-03-03: 2 mg via INTRAVENOUS
  Administered 2018-03-03: 1 mg via INTRAVENOUS

## 2018-03-03 MED ORDER — SODIUM CHLORIDE 0.9% FLUSH
3.0000 mL | Freq: Two times a day (BID) | INTRAVENOUS | Status: DC
  Administered 2018-03-05 – 2018-03-18 (×22): 3 mL via INTRAVENOUS

## 2018-03-03 MED ORDER — FENTANYL CITRATE (PF) 100 MCG/2ML IJ SOLN
INTRAMUSCULAR | Status: AC
  Filled 2018-03-03: qty 2

## 2018-03-03 MED ORDER — HEPARIN (PORCINE) IN NACL 2-0.9 UNIT/ML-% IJ SOLN
INTRAMUSCULAR | Status: AC
  Filled 2018-03-03: qty 500

## 2018-03-03 MED ORDER — ACETAMINOPHEN 325 MG PO TABS
650.0000 mg | ORAL_TABLET | ORAL | Status: DC | PRN
  Administered 2018-03-07 – 2018-03-12 (×2): 650 mg via ORAL
  Filled 2018-03-03 (×2): qty 2

## 2018-03-03 MED ORDER — SODIUM CHLORIDE 0.9 % IV SOLN
250.0000 mL | INTRAVENOUS | Status: DC | PRN
  Administered 2018-03-12: 08:00:00 via INTRAVENOUS

## 2018-03-03 MED ORDER — HEPARIN (PORCINE) IN NACL 2-0.9 UNIT/ML-% IJ SOLN
INTRAMUSCULAR | Status: AC | PRN
  Administered 2018-03-03: 500 mL

## 2018-03-03 MED ORDER — HYDRALAZINE HCL 20 MG/ML IJ SOLN
5.0000 mg | INTRAMUSCULAR | Status: AC | PRN
  Administered 2018-03-13 – 2018-03-18 (×2): 5 mg via INTRAVENOUS
  Filled 2018-03-03 (×2): qty 1

## 2018-03-03 MED ORDER — SODIUM CHLORIDE 0.9% FLUSH
3.0000 mL | INTRAVENOUS | Status: DC | PRN
  Administered 2018-03-14: 3 mL via INTRAVENOUS
  Filled 2018-03-03: qty 3

## 2018-03-03 MED ORDER — HEPARIN SODIUM (PORCINE) 1000 UNIT/ML IJ SOLN
INTRAMUSCULAR | Status: AC
  Filled 2018-03-03: qty 1

## 2018-03-03 MED ORDER — HEPARIN SODIUM (PORCINE) 1000 UNIT/ML IJ SOLN
INTRAMUSCULAR | Status: DC | PRN
  Administered 2018-03-03: 9000 [IU] via INTRAVENOUS

## 2018-03-03 MED ORDER — ATORVASTATIN CALCIUM 10 MG PO TABS
10.0000 mg | ORAL_TABLET | Freq: Every day | ORAL | Status: DC
  Administered 2018-03-03 – 2018-03-17 (×11): 10 mg via ORAL
  Filled 2018-03-03 (×12): qty 1

## 2018-03-03 SURGICAL SUPPLY — 26 items
BALLN LUTONIX DCB 6X40X130 (BALLOONS) ×8
BALLOON LUTONIX DCB 6X40X130 (BALLOONS) IMPLANT
BUR JETSTREAM XC 2.4/3.4 (BURR) IMPLANT
BURR JETSTREAM XC 2.4/3.4 (BURR) ×4
CATH OMNI FLUSH 5F 65CM (CATHETERS) ×1 IMPLANT
DEVICE CLOSURE MYNXGRIP 6/7F (Vascular Products) ×1 IMPLANT
DEVICE TORQUE H2O (MISCELLANEOUS) ×1 IMPLANT
DRAPE ZERO GRAVITY STERILE (DRAPES) ×1 IMPLANT
FILTER CO2 0.2 MICRON (VASCULAR PRODUCTS) ×1 IMPLANT
GUIDEWIRE ANGLED .035X150CM (WIRE) ×1 IMPLANT
HOVERMATT SINGLE USE (MISCELLANEOUS) ×1 IMPLANT
KIT ENCORE 26 ADVANTAGE (KITS) ×1 IMPLANT
KIT MICROPUNCTURE NIT STIFF (SHEATH) ×1 IMPLANT
KIT PV (KITS) ×4 IMPLANT
LUBRICANT VIPERSLIDE CORONARY (MISCELLANEOUS) ×1 IMPLANT
RESERVOIR CO2 (VASCULAR PRODUCTS) ×1 IMPLANT
SET FLUSH CO2 (MISCELLANEOUS) ×1 IMPLANT
SHEATH AVANTI 11CM 5FR (SHEATH) ×1 IMPLANT
SHEATH AVANTI 11CM 7FR (SHEATH) ×1 IMPLANT
SHEATH PINNACLE ST 7F 45CM (SHEATH) ×1 IMPLANT
STENT ABSOLUTE PRO 7X40X135 (Permanent Stent) ×1 IMPLANT
SYR MEDRAD MARK V 150ML (SYRINGE) ×4 IMPLANT
TRANSDUCER W/STOPCOCK (MISCELLANEOUS) ×4 IMPLANT
TRAY PV CATH (CUSTOM PROCEDURE TRAY) ×4 IMPLANT
WIRE BENTSON .035X145CM (WIRE) ×2 IMPLANT
WIRE SPARTACORE .014X300CM (WIRE) ×1 IMPLANT

## 2018-03-03 NOTE — Progress Notes (Signed)
Patient ID: Shelby Reese, female   DOB: 08/11/1935, 83 y.o.   MRN: [id number]  PROGRESS NOTE    NYISHA CLIPPARD  WUJ:[id number] DOB: 10-11-1935 DOA: 02/23/2018 PCP: [name], MD   Brief Narrative:  83 year old female with history of peripheral arterial disease status post right AKA, bilateral severe chronic venous stasis, hypertension, iron deficiency anemia, hypothyroidism 02/23/2018 with acute and chronic anemia with guaiac positive stools and acute kidney injury in the setting of worsening left lower extremity ulcer and infection.  GI was consulted and patient refused any further GI workup.  Vascular surgery was consulted.  Vascular surgery recommended arteriogram after renal function stabilized.  Patient was transferred to [facility] on 03/02/2018.   Assessment & Plan:   Active Problems:   Hypothyroidism, acquired   Iron deficiency anemia   Atherosclerosis of artery of right lower extremity (HCC)   Essential hypertension   CKD (chronic kidney disease), stage III (HCC)   Peripheral vascular disease (HCC)   Anemia   Foot ulcer (HCC)   Arterioloscleroses   Left lower extremity ulcer with infection -Currently on Rocephin and clindamycin.  Cultures negative so far -ABIs and MRI could not be tolerated due to pain -Vascular surgery following.  For probable arteriogram today  Chronic anemia -Patient was seen by GI and patient refused further GI evaluation -Transfuse as needed if hemoglobin is less than 7 or if there is active bleeding -Continue PPI and oral iron supplementation  Acute kidney injury -Resolved.  Creatinine 1.06 today.  Home benazepril and furosemide on hold -Normal renal ultrasound on 02/25/2018  Peripheral arterial disease -With history of right AKA -Continue aspirin -Vascular surgery following  Hypertension -Monitor blood pressure.  Continue diltiazem and hydralazine.  Home benazepril and furosemide on hold  Obesity -Outpatient follow-up  Chronic  venous stasis -Continue skin care  Hypothyroidism -Continue levothyroxine   DVT prophylaxis: Heparin Code Status: Full Family Communication: None at bedside Disposition Plan: Depends on further clinical outcome  Consultants: Vascular surgery/GI  Procedures: None  Antimicrobials:  Rocephin and clindamycin from 02/24/2018 onwards   Subjective: Patient seen and examined at bedside.  She denies any overnight fever, nausea or vomiting.  Objective: Vitals:   03/02/18 1352 03/02/18 2050 03/02/18 2103 03/03/18 0550  BP: (!) 161/54 (!) 137/57  (!) 141/59  Pulse: 82 92  74  Resp: 19 18    Temp: 99.1 F (37.3 C) 99.1 F (37.3 C)  97.7 F (36.5 C)  TempSrc: Oral Oral  Oral  SpO2: 96% 97%  97%  Weight:   89.9 kg (198 lb 1.6 oz)   Height:   5\' 3"  (1.6 m)     Intake/Output Summary (Last 24 hours) at 03/03/2018 1319 Last data filed at 03/03/2018 1252 Gross per 24 hour  Intake 801.25 ml  Output 850 ml  Net -48.75 ml   Filed Weights   02/23/18 2100 02/24/18 0600 03/02/18 2103  Weight: 81.3 kg (179 lb 3.7 oz) 81.9 kg (180 lb 8.9 oz) 89.9 kg (198 lb 1.6 oz)    Examination:  General exam: Appears calm and comfortable  Respiratory system: Bilateral decreased breath sound at bases Cardiovascular system: S1 & S2 heard, rate controlled  gastrointestinal system: Abdomen is nondistended, soft and nontender. Normal bowel sounds heard. Extremities: No cyanosis, clubbing; chronic left lower extremity venous stasis changes with lower extremity dressing present; right AKA   Data Reviewed: I have personally reviewed following labs and imaging studies  CBC: Recent Labs  Lab 02/25/18 0527 03/03/18  0919  WBC 6.3 6.1  NEUTROABS  --  3.6  HGB 7.9* 8.0*  HCT 25.4* 26.3*  MCV 85.8 84.8  PLT 282 443   Basic Metabolic Panel: Recent Labs  Lab 02/25/18 0527 02/26/18 0504 02/27/18 0533 02/28/18 0457 03/02/18 0528 03/03/18 0919  NA 136 135 135 135 136 134*  K 4.7 4.8 4.7 4.4 4.4 4.3   CL 104 101 104 106 105 103  CO2 23 23 22  21* 21* 22  GLUCOSE 86 88 86 80 88 78  BUN 51* 45* 38* 26* 17 13  CREATININE 2.17* 1.84* 1.47* 1.13* 1.08* 1.06*  CALCIUM 9.0 9.1 9.1 9.1 9.9 9.7  MG 2.1  --   --   --   --   --    GFR: Estimated Creatinine Clearance: 45 mL/min (A) (by C-G formula based on SCr of 1.06 mg/dL (H)). Liver Function Tests: No results for input(s): AST, ALT, ALKPHOS, BILITOT, PROT, ALBUMIN in the last 168 hours. No results for input(s): LIPASE, AMYLASE in the last 168 hours. No results for input(s): AMMONIA in the last 168 hours. Coagulation Profile: No results for input(s): INR, PROTIME in the last 168 hours. Cardiac Enzymes: No results for input(s): CKTOTAL, CKMB, CKMBINDEX, TROPONINI in the last 168 hours. BNP (last 3 results) No results for input(s): PROBNP in the last 8760 hours. HbA1C: No results for input(s): HGBA1C in the last 72 hours. CBG: No results for input(s): GLUCAP in the last 168 hours. Lipid Profile: No results for input(s): CHOL, HDL, LDLCALC, TRIG, CHOLHDL, LDLDIRECT in the last 72 hours. Thyroid Function Tests: No results for input(s): TSH, T4TOTAL, FREET4, T3FREE, THYROIDAB in the last 72 hours. Anemia Panel: No results for input(s): VITAMINB12, FOLATE, FERRITIN, TIBC, IRON, RETICCTPCT in the last 72 hours. Sepsis Labs: No results for input(s): PROCALCITON, LATICACIDVEN in the last 168 hours.  Recent Results (from the past 240 hour(s))  MRSA PCR Screening     Status: None   Collection Time: 02/23/18  2:49 PM  Result Value Ref Range Status   MRSA by PCR NEGATIVE NEGATIVE Final    Comment:        The GeneXpert MRSA Assay (FDA approved for NASAL specimens only), is one component of a comprehensive MRSA colonization surveillance program. It is not intended to diagnose MRSA infection nor to guide or monitor treatment for MRSA infections. Performed at [facility], [place] [address] [place], [place] [address]     Blood culture (routine x 2)     Status: None   Collection Time: 02/23/18  3:23 PM  Result Value Ref Range Status   Specimen Description   Final    BLOOD RIGHT ANTECUBITAL Performed at [place] [address] [place], [place] [address]    Special Requests   Final    BOTTLES DRAWN AEROBIC AND ANAEROBIC Blood Culture adequate volume Performed at [place] [address] [place], [place] [address]    Culture   Final    NO GROWTH 5 DAYS Performed at [facility], [place] [address], [place], [place] [address]    Report Status 02/28/2018 FINAL  Final  Urine culture     Status: Abnormal   Collection Time: 02/23/18  9:29 PM  Result Value Ref Range Status   Specimen Description   Final    URINE, RANDOM Performed at [place] [address], [place], [place] [address]    Special Requests   Final  NONE Performed at [facility], [place] [address] [place], [place] [address]    Culture >=100,000 COLONIES/mL PROVIDENCIA RETTGERI (A)  Final   Report Status 02/26/2018 FINAL  Final   Organism ID, Bacteria PROVIDENCIA RETTGERI (A)  Final      Susceptibility   Providencia rettgeri - MIC*    AMPICILLIN RESISTANT Resistant     CEFAZOLIN >=64 RESISTANT Resistant     CEFTRIAXONE <=1 SENSITIVE Sensitive     CIPROFLOXACIN <=0.25 SENSITIVE Sensitive     GENTAMICIN <=1 SENSITIVE Sensitive     IMIPENEM 1 SENSITIVE Sensitive     NITROFURANTOIN 128 RESISTANT Resistant     TRIMETH/SULFA <=20 SENSITIVE Sensitive     AMPICILLIN/SULBACTAM 16 INTERMEDIATE Intermediate     PIP/TAZO <=4 SENSITIVE Sensitive     * >=100,000 COLONIES/mL PROVIDENCIA RETTGERI  Blood culture (routine x 2)     Status: None   Collection Time: 02/24/18  3:33 AM  Result Value Ref Range Status   Specimen Description   Final    BLOOD RIGHT HAND Performed at [facility] [address], [place], [place] [address]     Special Requests   Final    BOTTLES DRAWN AEROBIC AND ANAEROBIC Blood Culture adequate volume Performed at [place] [address] [place], [place] [address]    Culture   Final    NO GROWTH 5 DAYS Performed at [facility], [place] [address], [place], [place] [address]    Report Status 03/01/2018 FINAL  Final         Radiology Studies: No results found.      Scheduled Meds: . [MAR Hold] amitriptyline  50 mg Oral QHS  . [MAR Hold] aspirin EC  81 mg Oral Q breakfast  . [MAR Hold] calcium-vitamin D  1 tablet Oral Q breakfast  . [MAR Hold] diltiazem  120 mg Oral Q breakfast  . [MAR Hold] docusate sodium  100 mg Oral Daily  . [MAR Hold] escitalopram  10 mg Oral Daily  . [MAR Hold] famotidine  20 mg Oral Q1500  . [MAR Hold] feeding supplement (ENSURE ENLIVE)  237 mL Oral TID WC  . [MAR Hold] fentaNYL  25 mcg Transdermal Q72H  . [MAR Hold] ferrous sulfate  325 mg Oral BID WC  . [MAR Hold] heparin  5,000 Units Subcutaneous Q8H  . [MAR Hold] hydrALAZINE  50 mg Oral QID  . [MAR Hold] levothyroxine  175 mcg Oral QAC breakfast  . [MAR Hold] loratadine  10 mg Oral Daily  . [MAR Hold] multivitamin with minerals  1 tablet Oral Daily  . [MAR Hold] oxyCODONE  5 mg Oral Q4H  . [MAR Hold] pantoprazole  40 mg Oral Daily  . [MAR Hold] polyethylene glycol  17 g Oral BID  . [MAR Hold] senna-docusate  2 tablet Oral BID  . [MAR Hold] vitamin C  500 mg Oral Daily   Continuous Infusions: . sodium chloride Stopped (03/03/18 1253)  . [MAR Hold] cefTRIAXone (ROCEPHIN)  IV Stopped (03/03/18 0946)  . [MAR Hold] clindamycin (CLEOCIN) IV Stopped (03/03/18 1036)     LOS: 8 days        Aline August, MD [facility] Pager [phone]  If 7PM-7AM, please contact night-coverage [email or web address] Password [email or web address] 03/03/2018, 1:19 PM

## 2018-03-03 NOTE — Interval H&P Note (Signed)
History and Physical Interval Note:  03/03/2018 12:53 PM  Shelby Reese  has presented today for surgery, with the diagnosis of pvd - grangren left foot  The various methods of treatment have been discussed with the patient and family. After consideration of risks, benefits and other options for treatment, the patient has consented to  Procedure(s): LOWER EXTREMITY ANGIOGRAPHY (N/A) as a surgical intervention .  The patient's history has been reviewed, patient examined, no change in status, stable for surgery.  I have reviewed the patient's chart and labs.  Questions were answered to the patient's satisfaction.     Annamarie Major

## 2018-03-03 NOTE — Op Note (Signed)
Patient name: Shelby Reese MRN: [id number] DOB: 16-Aug-1935 Sex: female  02/23/2018 - 03/03/2018 Pre-operative Diagnosis: left foot ulcer Post-operative diagnosis:  Same Surgeon:  Annamarie Major Procedure Performed:  1.  Ultrasound-guided access, right femoral artery  2.  Abdominal aortogram with CO2  3.  Left lower extremity runoff  4.  Atherectomy with drug-coated balloon angioplasty, left common femoral artery  5.  Atherectomy with stenting, left superficial femoral artery  6.  Mechanical thrombectomy, left superficial femoral artery  7.  Conscious sedation (114 minutes)   Indications: The patient has previously undergone a right leg amputation.  She has a large wound on her left foot.  Amputation has been recommended however the patient does not wish to have this done and wants to exhaust all possible options for proceeding with amputation versus palliative care.  Procedure:  The patient was identified in the holding area and taken to room 8.  The patient was then placed supine on the table and prepped and draped in the usual sterile fashion.  A time out was called.  Conscious sedation was administered with the use of IV fentanyl and Versed under continuous physician and nurse monitoring.  Heart rate, blood pressure, and oxygen saturations were continuously monitored.  Ultrasound was used to evaluate the right common femoral artery.  It was patent .  A digital ultrasound image was acquired.  A micropuncture needle was used to access the right common femoral artery under ultrasound guidance.  An 018 wire was advanced without resistance and a micropuncture sheath was placed.  The 018 wire was removed and a benson wire was placed.  The micropuncture sheath was exchanged for a 5 french sheath.  An omniflush catheter was advanced over the wire to the level of L-1.  An abdominal angiogram was obtained with CO2..  Next, using the omniflush catheter and a benson wire, the aortic bifurcation was crossed  and the catheter was placed into theleft external iliac artery and left runoff was obtained.   Findings:   Aortogram: No significant renal artery stenosis was identified.  The infrarenal abdominal aorta is widely patent.  Bilateral common and external iliac arteries widely patent.  Right Lower Extremity: Not evaluated  Left Lower Extremity: There is a greater than 80% stenosis within the common femoral artery.  The profundofemoral artery is patent throughout its course the superficial femoral artery has mild luminal irregularity and is heavily calcified.  Maximum stenosis is approximately 50%.  The popliteal artery is patent throughout its course but calcified.  There is two-vessel runoff via the anterior tibial and peroneal artery.  Posterior tibial artery is occluded.  Intervention: After the above images were acquired the decision was made to proceed with intervention.  A 7 French 45 cm sheath was advanced over the aortic bifurcation and placed into the left external iliac artery.  Patient was fully heparinized.  I then advanced a 014 [place] core wire into the superficial femoral artery.  A jetstream device was inserted and mechanical atherectomy was performed making 2 passes down and back with blades down and one pass with blades up.  I then selected a 6 x 40 drug-coated Lutonix balloon and performed balloon angioplasty of the common femoral artery taking the balloon to 5 atm for 2 minutes.  Completion imaging revealed widely patent common femoral artery with no residual stenosis.  There was an area in the proximal superficial femoral artery where there was a filling defect.  I was concerned that this potentially could  be thrombus.  I elected to reinserted the jetstream device to perform both atherectomy and thrombectomy.  A jetstream device was advanced into the superficial femoral artery and atherectomy as well as mechanical thrombectomy were performed in the proximal superficial femoral artery for a  distance of approximately 30 cm.  2 passes down the back were made with the device.  The device was then removed and then a 6 x 40 drug-coated Lutonix balloon was used to perform balloon angioplasty taking the balloon to 5 atm for 2 minutes.  Completion imaging revealed a non-flow-limiting dissection.  I elected to try and tacked this up by repeating the balloon angioplasty at low inflation pressure for 3 minutes.  Follow-up imaging revealed the dissection appeared worse and therefore I elected to stent this area.  A 7 x 40 INNOVA stent was then deployed across the area of concern and molded to confirmation with a 6 mm balloon.  Completion imaging revealed resolution of the dissection with in-line flow down to the foot.  The runoff is unchanged.  This point time I elected to place a closure device.  The long 7 French sheath was exchanged out for a short 7 [place] sheath.  A Mynx device was used to close the groin however this failed.  I held manual pressure for 15 minutes.  Additional pressure was held for another 15 minutes.  The patient did receive 50 mg of protamine.  Impression:  #1  Greater than 70% left common femoral artery stenosis successfully treated with atherectomy and drug-coated balloon angioplasty using a 6 mm balloon  #2  Atherectomy with mechanical thrombectomy of the proximal superficial femoral artery for a filling defect seen after intervention in the common femoral artery.  There was initially a dissection which had to be covered with a 7 x 40 self-expanding stent  #3  Two-vessel runoff via the anterior tibial and peroneal artery.     Theotis Burrow, M.D. [facility] Office: [phone] Pager:  [phone]

## 2018-03-03 NOTE — Care Management Note (Signed)
Case Management Note  Patient Details  Name: Shelby Reese MRN: [id number] Date of Birth: 1935/09/26  Subjective/Objective:                    Action/Plan: Patient from [place] at [place], active with [facility] . Will continue to follow for discharge needs. If patient needs home health at discharge will need new orders and face to face  Expected Discharge Date:                  Expected Discharge Plan:  Assisted Living / Rest Home  In-House Referral:  Clinical Social Work  Discharge planning Services  CM Consult  Post Acute Care Choice:  Home Health(Active w/[facility] following.) Choice offered to:     DME Arranged:    DME Agency:     HH Arranged:  RN, PT, OT [place] Agency:     Status of Service:  In process, will continue to follow  If discussed at Long Length of Stay Meetings, dates discussed:    Additional Comments:  Marilu Favre, RN 03/03/2018, 11:11 AM

## 2018-03-04 ENCOUNTER — Encounter (HOSPITAL_COMMUNITY): Payer: Self-pay | Admitting: Surgery

## 2018-03-04 DIAGNOSIS — N179 Acute kidney failure, unspecified: Secondary | ICD-10-CM

## 2018-03-04 DIAGNOSIS — I739 Peripheral vascular disease, unspecified: Secondary | ICD-10-CM

## 2018-03-04 DIAGNOSIS — N390 Urinary tract infection, site not specified: Secondary | ICD-10-CM | POA: Clinically undetermined

## 2018-03-04 DIAGNOSIS — L97909 Non-pressure chronic ulcer of unspecified part of unspecified lower leg with unspecified severity: Secondary | ICD-10-CM

## 2018-03-04 LAB — CBC
HEMATOCRIT: 27.4 % — AB (ref 36.0–46.0)
HEMOGLOBIN: 8.3 g/dL — AB (ref 12.0–15.0)
MCH: 25.9 pg — AB (ref 26.0–34.0)
MCHC: 30.3 g/dL (ref 30.0–36.0)
MCV: 85.6 fL (ref 78.0–100.0)
Platelets: 331 10*3/uL (ref 150–400)
RBC: 3.2 MIL/uL — ABNORMAL LOW (ref 3.87–5.11)
RDW: 15.9 % — ABNORMAL HIGH (ref 11.5–15.5)
WBC: 9.6 10*3/uL (ref 4.0–10.5)

## 2018-03-04 LAB — BASIC METABOLIC PANEL
Anion gap: 9 (ref 5–15)
BUN: 12 mg/dL (ref 6–20)
CALCIUM: 9.5 mg/dL (ref 8.9–10.3)
CO2: 24 mmol/L (ref 22–32)
Chloride: 102 mmol/L (ref 101–111)
Creatinine, Ser: 1.14 mg/dL — ABNORMAL HIGH (ref 0.44–1.00)
GFR calc Af Amer: 51 mL/min — ABNORMAL LOW (ref >60)
GFR, EST NON AFRICAN AMERICAN: 44 mL/min — AB (ref >60)
GLUCOSE: 129 mg/dL — AB (ref 65–99)
POTASSIUM: 4.1 mmol/L (ref 3.5–5.1)
SODIUM: 135 mmol/L (ref 135–145)

## 2018-03-04 LAB — CBC WITH DIFFERENTIAL/PLATELET
BASOS ABS: 0 10*3/uL (ref 0.0–0.1)
BASOS PCT: 0 %
Eosinophils Absolute: 0.5 10*3/uL (ref 0.0–0.7)
Eosinophils Relative: 6 %
HCT: 26.9 % — ABNORMAL LOW (ref 36.0–46.0)
Hemoglobin: 8.2 g/dL — ABNORMAL LOW (ref 12.0–15.0)
LYMPHS PCT: 13 %
Lymphs Abs: 1.1 10*3/uL (ref 0.7–4.0)
MCH: 26.4 pg (ref 26.0–34.0)
MCHC: 30.5 g/dL (ref 30.0–36.0)
MCV: 86.5 fL (ref 78.0–100.0)
MONOS PCT: 6 %
Monocytes Absolute: 0.5 10*3/uL (ref 0.1–1.0)
NEUTROS ABS: 6.5 10*3/uL (ref 1.7–7.7)
NEUTROS PCT: 75 %
PLATELETS: 332 10*3/uL (ref 150–400)
RBC: 3.11 MIL/uL — AB (ref 3.87–5.11)
RDW: 16.4 % — AB (ref 11.5–15.5)
WBC: 8.6 10*3/uL (ref 4.0–10.5)

## 2018-03-04 LAB — MAGNESIUM: MAGNESIUM: 1.8 mg/dL (ref 1.7–2.4)

## 2018-03-04 MED ORDER — LORATADINE 10 MG PO TABS
10.0000 mg | ORAL_TABLET | Freq: Every day | ORAL | Status: DC
  Filled 2018-03-04: qty 1

## 2018-03-04 MED ORDER — DIPHENHYDRAMINE HCL 25 MG PO CAPS
25.0000 mg | ORAL_CAPSULE | Freq: Once | ORAL | Status: AC
  Administered 2018-03-04: 25 mg via ORAL
  Filled 2018-03-04: qty 1

## 2018-03-04 MED ORDER — DIPHENHYDRAMINE HCL 25 MG PO CAPS
25.0000 mg | ORAL_CAPSULE | Freq: Three times a day (TID) | ORAL | Status: DC
  Administered 2018-03-04 – 2018-03-05 (×3): 25 mg via ORAL
  Filled 2018-03-04 (×3): qty 1

## 2018-03-04 MED ORDER — MAGNESIUM SULFATE 2 GM/50ML IV SOLN
2.0000 g | Freq: Once | INTRAVENOUS | Status: AC
  Administered 2018-03-04: 2 g via INTRAVENOUS
  Filled 2018-03-04: qty 50

## 2018-03-04 NOTE — Progress Notes (Addendum)
Physical Therapy Treatment Patient Details Name: Shelby Reese MRN: [id number] DOB: May 25, 1935 Today's Date: 03/04/2018    History of Present Illness 83 year old with past medical history relevant for peripheral arterial disease status post right AKA on 12/15/2017, bilateral severe chronic venous stasis, hypertension, iron deficiency anemia, hypothyroidism admitted with acute on chronic anemia with guaiac positive stools and acute kidney injury in the setting of worsening left lower extremity ulcer and infection.    PT Comments    Patient slowly progressing towards goals. Performing bed mobility with mod assist for supine to sit and able to maintain sitting balance well. Eager to sit on edge of bed to eat meal; instructed to notify NT when she wanted to return to supine and NT notified as well. Patient previously performing slideboard transfers from bed to wheelchair at [facility]. Attempted SB transfer today but patient unable to lean to right side to place SB secondary to pain. Patient very fearful of any movement and became emotionally labile during one point of the session stating, "If I mess this incision up, I'm going to die." Declined hoyer lift to chair. Will continue to follow to see if can progress.    Follow Up Recommendations  SNF     Equipment Recommendations  None recommended by PT    Recommendations for Other Services       Precautions / Restrictions Precautions Precautions: Fall Restrictions Weight Bearing Restrictions: No    Mobility  Bed Mobility Overal bed mobility: Needs Assistance Bed Mobility: Supine to Sit     Supine to sit: HOB elevated;Mod assist     General bed mobility comments: pt able to advance LLE to left edge of bed, instructions to reach for L railing, mod A to pivot hip with pad. pt sat on edge of bed 15 min with LLE supported, no loss of balance, pt left sitting to eat her meal, she will call nursing to assist her back to supine  Transfers                  General transfer comment: Not able to attempt  Ambulation/Gait                 Stairs             Wheelchair Mobility    Modified Rankin (Stroke Patients Only)       Balance Overall balance assessment: Needs assistance Sitting-balance support: Feet unsupported;Bilateral upper extremity supported Sitting balance-Leahy Scale: Good                                      Cognition Arousal/Alertness: Awake/alert Behavior During Therapy: WFL for tasks assessed/performed Overall Cognitive Status: No family/caregiver present to determine baseline cognitive functioning                                 General Comments: Patient alert and oriented to self, situation, and place, however not oriented to year. Has confusion, thought the fall bag was part of her personal belongings and was asking PT to look through it for her glasses      Exercises Total Joint Exercises Long Arc Quad: AROM;10 reps;Left;Seated Other Exercises Other Exercises: supine glute sets x 5     General Comments General comments (skin integrity, edema, etc.): Patient wears glasses but says they are missing  Pertinent Vitals/Pain Pain Assessment: Faces Faces Pain Scale: Hurts even more Pain Location: L lower leg Pain Descriptors / Indicators: Sore Pain Intervention(s): Limited activity within patient's tolerance;Monitored during session    Home Living                      Prior Function            PT Goals (current goals can now be found in the care plan section) Acute Rehab PT Goals Patient Stated Goal: be able to transfer to [facility] PT Goal Formulation: With patient Time For Goal Achievement: 03/13/18 Potential to Achieve Goals: Fair Progress towards PT goals: Progressing toward goals    Frequency    Min 1X/week      PT Plan Current plan remains appropriate    Co-evaluation              AM-PAC PT "6 Clicks" Daily  Activity  Outcome Measure  Difficulty turning over in bed (including adjusting bedclothes, sheets and blankets)?: Unable Difficulty moving from lying on back to sitting on the side of the bed? : Unable Difficulty sitting down on and standing up from a chair with arms (e.g., wheelchair, bedside commode, etc,.)?: Unable Help needed moving to and from a bed to chair (including a wheelchair)?: Total Help needed walking in hospital room?: Total Help needed climbing 3-5 steps with a railing? : Total 6 Click Score: 6    End of Session   Activity Tolerance: Patient limited by pain Patient left: in bed;with call bell/phone within reach Nurse Communication: Mobility status PT Visit Diagnosis: Muscle weakness (generalized) (M62.81);Pain Pain - Right/Left: Left Pain - part of body: Ankle and joints of foot;Leg     Time: 1100-1137 PT Time Calculation (min) (ACUTE ONLY): 37 min  Charges:  $Therapeutic Exercise: 8-22 mins $Therapeutic Activity: 8-22 mins                    G Codes:      Ellamae Sia, PT, DPT Acute Rehabilitation Services  Pager: [place] 03/04/2018, 11:58 AM

## 2018-03-04 NOTE — Progress Notes (Signed)
PROGRESS NOTE    Shelby Reese  PIR:[id number] DOB: [birth year] DOA: 02/23/2018 PCP: [name], MD    Brief Narrative:  83 year old female with history of peripheral arterial disease status post right AKA, bilateral severe chronic venous stasis, hypertension, iron deficiency anemia, hypothyroidism 02/23/2018 with acute and chronic anemia with guaiac positive stools and acute kidney injury in the setting of worsening left lower extremity ulcer and infection.  GI was consulted and patient refused any further GI workup.  Vascular surgery was consulted.  Vascular surgery recommended arteriogram after renal function stabilized.  Patient was transferred to [facility] on 03/02/2018.     Assessment & Plan:   Principal Problem:   Foot ulcer ([place]) Active Problems:   Peripheral vascular disease of lower extremity with ulceration (HCC)   Hypothyroidism, acquired   Iron deficiency anemia   Atherosclerosis of artery of right lower extremity (HCC)   Essential hypertension   CKD (chronic kidney disease), stage III (HCC)   Peripheral vascular disease (HCC)   Anemia   Arterioloscleroses   Acute lower UTI   AKI (acute kidney injury) ([place])  #1 left foot ulcer/peripheral vascular disease Patient with left foot ulcer concern for necrotic ulcer.  ABIs were ordered however unable to be tolerated per patient due to pain.  MRI also could not be done.  Patient was seen by vascular surgery Dr. Trula Slade who had initially recommended amputation however patient wanted to exhaust all options prior to decision for amputation.  Patient subsequently underwent arteriogram with atherectomy with drug-coated balloon angioplasty, left common femoral artery, atherectomy with stenting left superficial femoral artery, mechanical thrombectomy, left superficial femoral artery per Dr. Trula Slade 03/03/2018.  Continue current wound care.  To new aspirin, statin, Plavix.  Risk factor modification.  Discontinue IV clindamycin for  now.  Continue IV Rocephin and likely discontinue the next 24-48 hours.  Per vascular surgery.  2.  Chronic anemia  Patient seen in consultation by gastroenterology however patient refused any invasive procedures including endoscopy.  GI signed off.  Transfuse for hemoglobin less than 7 or active bleeding.  Continue Pepcid, oral iron sulfate, PPI.  Outpatient follow-up with PCP.  3.  Acute kidney injury Improved.  ACE inhibitor and Lasix on hold.  Follow.  4.  Hypertension Currently controlled on current regimen of diltiazem, hydralazine.  ACE inhibitor and diuretics on hold secondary to acute kidney injury.  Follow.  5.  Hypothyroidism Continue Synthroid.  6.  Providencia rettgeri UTI Currently on IV Rocephin.  7.  Obesity  8.  Chronic venous stasis changes Stable.      DVT prophylaxis: Heparin Code Status: Full Family Communication: Updated patient.  No family at bedside. Disposition Plan: Home when clinically improved and per vascular surgery.   Consultants:   Gastroenterology: Dr.Buccini 02/24/2018  Vascular surgery: Dr. Trula Slade 02/24/2018  Wound care Domenic Moras, RN 02/26/2018  Procedures:  Plain films of the left foot 02/23/2018  Renal ultrasound 02/25/2018  ABIs 02/24/2018--- unsuccessful due to patient intolerance.    1.  Ultrasound-guided access, right femoral artery               2.  Abdominal aortogram with CO2               3.  Left lower extremity runoff               4.  Atherectomy with drug-coated balloon angioplasty, left common femoral artery  5.  Atherectomy with stenting, left superficial femoral artery               6.  Mechanical thrombectomy, left superficial femoral artery               7.  Conscious sedation (114 minutes)----03/03/2018 per Dr. Trula Slade Antimicrobials:  IV Rocephin 02/24/2018  IV clindamycin 02/23/2018>>>>> 03/04/2018   Subjective: He denies any chest pain.  Denies any shortness of breath.  There is noted per nursing  that patient had some itching after IV clindamycin was hung last night however patient has been on IV antibiotics since admission.  Patient states takes Benadryl at home 3-4 times a day.  She denies any rectal bleeding.  Objective: Vitals:   03/03/18 2151 03/04/18 0356 03/04/18 0835 03/04/18 1635  BP: (!) 145/49 (!) 123/52 (!) 140/49 133/74  Pulse:  80    Resp:      Temp:  98.8 F (37.1 C)    TempSrc:  Oral    SpO2:  92%    Weight:      Height:        Intake/Output Summary (Last 24 hours) at 03/04/2018 1701 Last data filed at 03/03/2018 2200 Gross per 24 hour  Intake 480 ml  Output -  Net 480 ml   Filed Weights   02/23/18 2100 02/24/18 0600 03/02/18 2103  Weight: 81.3 kg (179 lb 3.7 oz) 81.9 kg (180 lb 8.9 oz) 89.9 kg (198 lb 1.6 oz)    Examination:  General exam: Appears calm and comfortable  Respiratory system: Clear to auscultation. Respiratory effort normal. Cardiovascular system: S1 & S2 heard, 3/6 SEM. No JVD, rubs, gallops or clicks. No pedal edema. Gastrointestinal system: Abdomen is nondistended, soft and nontender. No organomegaly or masses felt. Normal bowel sounds heard. Central nervous system: Alert and oriented. No focal neurological deficits. Extremities: Status post right BKA.  Left lower extremity with bandage around the foot.  Skin: No rashes, lesions or ulcers Psychiatry: Judgement and insight appear normal. Mood & affect appropriate.     Data Reviewed: I have personally reviewed following labs and imaging studies  CBC: Recent Labs  Lab 03/03/18 0919 03/03/18 2213 03/04/18 0015  WBC 6.1 9.6 8.6  NEUTROABS 3.6  --  6.5  HGB 8.0* 8.3* 8.2*  HCT 26.3* 27.4* 26.9*  MCV 84.8 85.6 86.5  PLT 309 331 161   Basic Metabolic Panel: Recent Labs  Lab 02/27/18 0533 02/28/18 0457 03/02/18 0528 03/03/18 0919 03/04/18 0015  NA 135 135 136 134* 135  K 4.7 4.4 4.4 4.3 4.1  CL 104 106 105 103 102  CO2 22 21* 21* 22 24  GLUCOSE 86 80 88 78 129*  BUN  38* 26* 17 13 12   CREATININE 1.47* 1.13* 1.08* 1.06* 1.14*  CALCIUM 9.1 9.1 9.9 9.7 9.5  MG  --   --   --   --  1.8   GFR: Estimated Creatinine Clearance: 41.9 mL/min (A) (by C-G formula based on SCr of 1.14 mg/dL (H)). Liver Function Tests: No results for input(s): AST, ALT, ALKPHOS, BILITOT, PROT, ALBUMIN in the last 168 hours. No results for input(s): LIPASE, AMYLASE in the last 168 hours. No results for input(s): AMMONIA in the last 168 hours. Coagulation Profile: No results for input(s): INR, PROTIME in the last 168 hours. Cardiac Enzymes: No results for input(s): CKTOTAL, CKMB, CKMBINDEX, TROPONINI in the last 168 hours. BNP (last 3 results) No results for input(s): PROBNP in the last 8760 hours. HbA1C:  No results for input(s): HGBA1C in the last 72 hours. CBG: No results for input(s): GLUCAP in the last 168 hours. Lipid Profile: No results for input(s): CHOL, HDL, LDLCALC, TRIG, CHOLHDL, LDLDIRECT in the last 72 hours. Thyroid Function Tests: No results for input(s): TSH, T4TOTAL, FREET4, T3FREE, THYROIDAB in the last 72 hours. Anemia Panel: No results for input(s): VITAMINB12, FOLATE, FERRITIN, TIBC, IRON, RETICCTPCT in the last 72 hours. Sepsis Labs: No results for input(s): PROCALCITON, LATICACIDVEN in the last 168 hours.  Recent Results (from the past 240 hour(s))  MRSA PCR Screening     Status: None   Collection Time: 02/23/18  2:49 PM  Result Value Ref Range Status   MRSA by PCR NEGATIVE NEGATIVE Final    Comment:        The GeneXpert MRSA Assay (FDA approved for NASAL specimens only), is one component of a comprehensive MRSA colonization surveillance program. It is not intended to diagnose MRSA infection nor to guide or monitor treatment for MRSA infections. Performed at [facility], [place] [address] [place], [place] [address]   Blood culture (routine x 2)     Status: None   Collection Time: 02/23/18  3:23 PM  Result Value Ref Range  Status   Specimen Description   Final    BLOOD RIGHT ANTECUBITAL Performed at [facility] [address], [place], [place] [address]    Special Requests   Final    BOTTLES DRAWN AEROBIC AND ANAEROBIC Blood Culture adequate volume Performed at [facility] [address], [place], [place] [address]    Culture   Final    NO GROWTH 5 DAYS Performed at [facility], [place] [address] [place], [place] [address]    Report Status 02/28/2018 FINAL  Final  Urine culture     Status: Abnormal   Collection Time: 02/23/18  9:29 PM  Result Value Ref Range Status   Specimen Description   Final    URINE, RANDOM Performed at [facility] [address], [place], [place] [address]    Special Requests   Final    NONE Performed at [facility], [place] [address], [place], [place] [address]    Culture >=100,000 COLONIES/mL PROVIDENCIA RETTGERI (A)  Final   Report Status 02/26/2018 FINAL  Final   Organism ID, Bacteria PROVIDENCIA RETTGERI (A)  Final      Susceptibility   Providencia rettgeri - MIC*    AMPICILLIN RESISTANT Resistant     CEFAZOLIN >=64 RESISTANT Resistant     CEFTRIAXONE <=1 SENSITIVE Sensitive     CIPROFLOXACIN <=0.25 SENSITIVE Sensitive     GENTAMICIN <=1 SENSITIVE Sensitive     IMIPENEM 1 SENSITIVE Sensitive     NITROFURANTOIN 128 RESISTANT Resistant     TRIMETH/SULFA <=20 SENSITIVE Sensitive     AMPICILLIN/SULBACTAM 16 INTERMEDIATE Intermediate     PIP/TAZO <=4 SENSITIVE Sensitive     * >=100,000 COLONIES/mL PROVIDENCIA RETTGERI  Blood culture (routine x 2)     Status: None   Collection Time: 02/24/18  3:33 AM  Result Value Ref Range Status   Specimen Description   Final    BLOOD RIGHT HAND Performed at [facility] [address], [place], [place] [address]    Special Requests   Final    BOTTLES DRAWN AEROBIC AND ANAEROBIC Blood Culture adequate volume Performed at  [facility] [address], [place], [place] [address]    Culture  Final    NO GROWTH 5 DAYS Performed at [facility], [place] [address] [place], [place] [address]    Report Status 03/01/2018 FINAL  Final         Radiology Studies: No results found.      Scheduled Meds: . amitriptyline  50 mg Oral QHS  . aspirin EC  81 mg Oral Daily  . atorvastatin  10 mg Oral q1800  . calcium-vitamin D  1 tablet Oral Q breakfast  . clopidogrel  75 mg Oral Q breakfast  . diltiazem  120 mg Oral Q breakfast  . diphenhydrAMINE  25 mg Oral Q8H  . docusate sodium  100 mg Oral Daily  . escitalopram  10 mg Oral Daily  . famotidine  20 mg Oral Q1500  . feeding supplement (ENSURE ENLIVE)  237 mL Oral TID WC  . fentaNYL  25 mcg Transdermal Q72H  . ferrous sulfate  325 mg Oral BID WC  . heparin  5,000 Units Subcutaneous Q8H  . hydrALAZINE  50 mg Oral QID  . levothyroxine  175 mcg Oral QAC breakfast  . loratadine  10 mg Oral Daily  . multivitamin with minerals  1 tablet Oral Daily  . oxyCODONE  5 mg Oral Q4H  . pantoprazole  40 mg Oral Daily  . polyethylene glycol  17 g Oral BID  . senna-docusate  2 tablet Oral BID  . sodium chloride flush  3 mL Intravenous Q12H  . vitamin C  500 mg Oral Daily   Continuous Infusions: . sodium chloride Stopped (03/03/18 1253)  . sodium chloride    . cefTRIAXone (ROCEPHIN)  IV Stopped (03/04/18 1059)     LOS: 9 days    Time spent: 35 minutes    Irine Seal, MD [facility] Pager [phone]  If 7PM-7AM, please contact night-coverage [email or web address] Password [email or web address] 03/04/2018, 5:01 PM

## 2018-03-04 NOTE — Progress Notes (Signed)
[facility]  Patient Status: Active (receiving services up to time of hospitalization)  [facility] is providing the following services: RN, PT and OT  If patient discharges after hours, please call [phone].   Shelby Reese 03/04/2018, 11:28 AM

## 2018-03-05 ENCOUNTER — Telehealth: Payer: Self-pay | Admitting: Surgery

## 2018-03-05 LAB — CBC
HCT: 23.8 % — ABNORMAL LOW (ref 36.0–46.0)
HEMOGLOBIN: 7.2 g/dL — AB (ref 12.0–15.0)
MCH: 25.7 pg — AB (ref 26.0–34.0)
MCHC: 30.3 g/dL (ref 30.0–36.0)
MCV: 85 fL (ref 78.0–100.0)
Platelets: 275 10*3/uL (ref 150–400)
RBC: 2.8 MIL/uL — ABNORMAL LOW (ref 3.87–5.11)
RDW: 16 % — ABNORMAL HIGH (ref 11.5–15.5)
WBC: 7.5 10*3/uL (ref 4.0–10.5)

## 2018-03-05 LAB — BASIC METABOLIC PANEL
Anion gap: 9 (ref 5–15)
BUN: 11 mg/dL (ref 6–20)
CHLORIDE: 104 mmol/L (ref 101–111)
CO2: 22 mmol/L (ref 22–32)
Calcium: 8.8 mg/dL — ABNORMAL LOW (ref 8.9–10.3)
Creatinine, Ser: 1.32 mg/dL — ABNORMAL HIGH (ref 0.44–1.00)
GFR calc non Af Amer: 37 mL/min — ABNORMAL LOW (ref >60)
GFR, EST AFRICAN AMERICAN: 43 mL/min — AB (ref >60)
Glucose, Bld: 87 mg/dL (ref 65–99)
POTASSIUM: 4.1 mmol/L (ref 3.5–5.1)
SODIUM: 135 mmol/L (ref 135–145)

## 2018-03-05 LAB — HEMOGLOBIN AND HEMATOCRIT, BLOOD
HEMATOCRIT: 25.1 % — AB (ref 36.0–46.0)
HEMOGLOBIN: 7.5 g/dL — AB (ref 12.0–15.0)

## 2018-03-05 LAB — MAGNESIUM: Magnesium: 2.1 mg/dL (ref 1.7–2.4)

## 2018-03-05 MED ORDER — DIPHENHYDRAMINE HCL 25 MG PO CAPS
25.0000 mg | ORAL_CAPSULE | Freq: Four times a day (QID) | ORAL | Status: DC | PRN
  Administered 2018-03-07 – 2018-03-08 (×2): 25 mg via ORAL
  Filled 2018-03-05 (×2): qty 1

## 2018-03-05 MED ORDER — HYDROCORTISONE 0.5 % EX CREA
TOPICAL_CREAM | Freq: Two times a day (BID) | CUTANEOUS | Status: DC
  Administered 2018-03-05 – 2018-03-08 (×8): via TOPICAL
  Administered 2018-03-11: 1 via TOPICAL
  Administered 2018-03-12 – 2018-03-18 (×8): via TOPICAL
  Filled 2018-03-05 (×2): qty 28.35

## 2018-03-05 NOTE — Telephone Encounter (Signed)
Sched appt 04/06/18 at 2:00. Lm on hm# to inform pt of appt.

## 2018-03-05 NOTE — Telephone Encounter (Signed)
-----   Message from Mena Goes, RN sent at 03/04/2018  9:48 AM EDT ----- Regarding: 1 month follow up   ----- Message ----- From: Serafina Mitchell, MD Sent: 03/03/2018   3:59 PM To: Vvs Charge Pool  03-03-2018:   Surgeon:  Annamarie Major Procedure Performed:  1.  Ultrasound-guided access, right femoral artery  2.  Abdominal aortogram with CO2  3.  Left lower extremity runoff  4.  Atherectomy with drug-coated balloon angioplasty, left common femoral artery  5.  Atherectomy with stenting, left superficial femoral artery  6.  Mechanical thrombectomy, left superficial femoral artery  7.  Conscious sedation (114 minutes)    Follow up  1 month

## 2018-03-05 NOTE — Progress Notes (Signed)
Clinical Social Worker following patient for support and discharge need. Patient stated she is from [facility] and would like to return back to facility with [facility]. RNCM and MD is aware of patients decision to return home. CSW spoke to [name] at [facility] at [place], [name] stated they will be able to take patient back in the morning.   Rhea Pink, MSW,  [place]

## 2018-03-05 NOTE — NC FL2 (Signed)
[place] LEVEL OF CARE SCREENING TOOL     IDENTIFICATION  Patient Name: Shelby Reese Birthdate: 1935/06/04 Sex: female Admission Date (Current Location): 02/23/2018  [facility] and [place] Number:  Herbalist and Address:  [facility], [place] [address], [place], [place] [address]      Provider Number: Mamie [id number]  Attending Physician Name and Address:  Eugenie Filler, MD  Relative Name and Phone Number:       Current Level of Care: Hospital Recommended Level of Care: Assisted Living Facility([facility] [place]) Prior Approval Number:    Date Approved/Denied:   PASRR Number:    Discharge Plan:      Current Diagnoses: Patient Active Problem List   Diagnosis Date Noted  . Acute lower UTI 03/04/2018  . AKI (acute kidney injury) ([place])   . Arterioloscleroses 03/02/2018  . Anemia 02/23/2018  . Foot ulcer ([place]) 02/23/2018  . Gangrene of foot ([place]) 02/18/2018  . Depression due to physical illness 02/18/2018  . Chronic ulcer of right heel with necrosis of bone ([place]) 12/27/2017  . Peripheral vascular disease of lower extremity with ulceration ([place]) 12/27/2017  . Acute renal failure superimposed on stage 2 chronic kidney disease ([place]) 12/27/2017  . GERD (gastroesophageal reflux disease) 12/27/2017  . Acute blood loss as cause of postoperative anemia 12/13/2017  . Wound of right leg, sequela 12/12/2017  . Open wound of right knee 11/28/2017  . Allergic drug rash   . Sepsis ([place]) 08/29/2017  . CKD (chronic kidney disease), stage III ([place]) 08/29/2017  . Hypothyroid 08/29/2017  . Hypertension 08/29/2017  . Peripheral vascular disease ([place]) 08/29/2017  . Skin ulcer of knee, right, with fat layer exposed ([place]) 08/29/2017  . Acute kidney injury ([place]) 08/29/2017  . Metabolic acidosis 81/19/1478  . Acute hyponatremia 08/29/2017  . Pressure injury of skin 07/25/2017  . Wound, open, knee, lower leg, or ankle with  complication, right, initial encounter   . Idiopathic chronic venous hypertension of both lower extremities with ulcer and inflammation ([place]) 07/15/2017  . Skin ulcer of right knee with necrosis of muscle ([place]) 07/15/2017  . Atherosclerosis of artery of right lower extremity ([place]) 07/11/2017  . Essential hypertension 07/11/2017  . Osteoporosis 07/11/2017  . Cataracts, bilateral 07/11/2017  . Hypothyroidism, acquired 12/27/2014  . Iron deficiency anemia 12/27/2014  . Hiatal hernia 12/27/2014  . Varicose veins of lower extremities with other complications 29/56/2130    Orientation RESPIRATION BLADDER Height & Weight     Self, Situation, Place, Time  Normal Incontinent Weight: 198 lb 1.6 oz (89.9 kg) Height:  5\' 3"  (160 cm)  BEHAVIORAL SYMPTOMS/MOOD NEUROLOGICAL BOWEL NUTRITION STATUS      Continent Diet(regular)  AMBULATORY STATUS COMMUNICATION OF NEEDS Skin   Extensive Assist Verbally Other (Comment)                       Personal Care Assistance Level of Assistance  Bathing, Feeding, Dressing Bathing Assistance: Maximum assistance Feeding assistance: Independent Dressing Assistance: Maximum assistance     Functional Limitations Info  Sight, Hearing, Speech Sight Info: Impaired(wears glasses) Hearing Info: Adequate Speech Info: Adequate    SPECIAL CARE FACTORS FREQUENCY  PT (By licensed PT), OT (By licensed OT)     PT Frequency: 2x wk OT Frequency: 2x wk            Contractures Contractures Info: Not present    Additional Factors Info  Code Status,  Allergies Code Status Info: Full Code Allergies Info: CINNAMON, CIPROFLOXACIN, DIOVAN VALSARTAN, FOOD, LATEX, NITROFURAN DERIVATIVES, PENICILLINS, BACTRIM SULFAMETHOXAZOLE-TRIMETHOPRIM, OTHER, SULFA ANTIBIOTICS            Current Medications (03/05/2018):  This is the current hospital active medication list Current Facility-Administered Medications  Medication Dose Route Frequency Provider Last Rate Last Dose   . 0.9 %  sodium chloride infusion   Intravenous Continuous Serafina Mitchell, MD 75 mL/hr at 03/05/18 0203    . 0.9 %  sodium chloride infusion  250 mL Intravenous PRN Serafina Mitchell, MD      . acetaminophen (TYLENOL) tablet 650 mg  650 mg Oral Q4H PRN Serafina Mitchell, MD      . amitriptyline (ELAVIL) tablet 50 mg  50 mg Oral QHS Serafina Mitchell, MD   50 mg at 03/04/18 2056  . aspirin EC tablet 81 mg  81 mg Oral Daily Serafina Mitchell, MD   81 mg at 03/05/18 9381  . atorvastatin (LIPITOR) tablet 10 mg  10 mg Oral q1800 Serafina Mitchell, MD   10 mg at 03/04/18 1636  . calcium-vitamin D (OSCAL WITH D) 500-200 MG-UNIT per tablet 1 tablet  1 tablet Oral Q breakfast Serafina Mitchell, MD   1 tablet at 03/05/18 [phone]  . clopidogrel (PLAVIX) tablet 75 mg  75 mg Oral Q breakfast Serafina Mitchell, MD   75 mg at 03/05/18 3716  . diltiazem (CARDIZEM CD) 24 hr capsule 120 mg  120 mg Oral Q breakfast Serafina Mitchell, MD   120 mg at 03/05/18 9678  . diphenhydrAMINE (BENADRYL) capsule 25 mg  25 mg Oral Q6H PRN Eugenie Filler, MD      . docusate sodium (COLACE) capsule 100 mg  100 mg Oral Daily Serafina Mitchell, MD   100 mg at 03/02/18 1050  . escitalopram (LEXAPRO) tablet 10 mg  10 mg Oral Daily Serafina Mitchell, MD   10 mg at 03/05/18 9381  . famotidine (PEPCID) tablet 20 mg  20 mg Oral Q1500 Serafina Mitchell, MD   20 mg at 03/04/18 1644  . feeding supplement (ENSURE ENLIVE) (ENSURE ENLIVE) liquid 237 mL  237 mL Oral TID WC Serafina Mitchell, MD   237 mL at 03/05/18 1239  . fentaNYL (DURAGESIC - dosed mcg/hr) patch 25 mcg  25 mcg Transdermal Q72H Serafina Mitchell, MD   25 mcg at 03/04/18 2103  . ferrous sulfate tablet 325 mg  325 mg Oral BID WC Serafina Mitchell, MD   325 mg at 03/05/18 0175  . heparin injection 5,000 Units  5,000 Units Subcutaneous Q8H Serafina Mitchell, MD   5,000 Units at 03/05/18 0617  . hydrALAZINE (APRESOLINE) injection 5 mg  5 mg Intravenous Q20 Min PRN Serafina Mitchell, MD      .  hydrALAZINE (APRESOLINE) tablet 50 mg  50 mg Oral QID Serafina Mitchell, MD   50 mg at 03/05/18 1025  . hydrocortisone cream 0.5 %   Topical BID Eugenie Filler, MD      . HYDROmorphone (DILAUDID) injection 0.5 mg  0.5 mg Intravenous Q4H PRN Aline August, MD      . labetalol (NORMODYNE,TRANDATE) injection 10 mg  10 mg Intravenous Q10 min PRN Serafina Mitchell, MD      . levothyroxine (SYNTHROID, LEVOTHROID) tablet 175 mcg  175 mcg Oral QAC breakfast Serafina Mitchell, MD   175 mcg at 03/05/18 8527  . loratadine (CLARITIN)  tablet 10 mg  10 mg Oral Daily Serafina Mitchell, MD   10 mg at 03/05/18 5621  . magnesium citrate solution 1 Bottle  1 Bottle Oral Daily PRN Serafina Mitchell, MD      . methocarbamol (ROBAXIN) tablet 500 mg  500 mg Oral Q6H PRN Serafina Mitchell, MD   500 mg at 03/02/18 2238  . multivitamin with minerals tablet 1 tablet  1 tablet Oral Daily Serafina Mitchell, MD   1 tablet at 03/05/18 [phone]  . ondansetron (ZOFRAN) injection 4 mg  4 mg Intravenous Q6H PRN Serafina Mitchell, MD   4 mg at 03/05/18 0942  . ondansetron (ZOFRAN) tablet 4 mg  4 mg Oral Q6H PRN Serafina Mitchell, MD      . oxyCODONE (Oxy IR/ROXICODONE) immediate release tablet 5 mg  5 mg Oral Q4H Serafina Mitchell, MD   5 mg at 03/05/18 5784  . pantoprazole (PROTONIX) EC tablet 40 mg  40 mg Oral Daily Serafina Mitchell, MD   40 mg at 03/05/18 6962  . polyethylene glycol (MIRALAX / GLYCOLAX) packet 17 g  17 g Oral BID Serafina Mitchell, MD   17 g at 03/02/18 1042  . senna-docusate (Senokot-S) tablet 2 tablet  2 tablet Oral BID Serafina Mitchell, MD   2 tablet at 03/04/18 2056  . sodium chloride flush (NS) 0.9 % injection 3 mL  3 mL Intravenous Q12H Serafina Mitchell, MD   3 mL at 03/05/18 1118  . sodium chloride flush (NS) 0.9 % injection 3 mL  3 mL Intravenous PRN Serafina Mitchell, MD      . vitamin C (ASCORBIC ACID) tablet 500 mg  500 mg Oral Daily Serafina Mitchell, MD   500 mg at 03/05/18 9528  . zolpidem (AMBIEN) tablet 5 mg   5 mg Oral QHS PRN Serafina Mitchell, MD   5 mg at 02/28/18 2224     Discharge Medications: Please see discharge summary for a list of discharge medications.  Relevant Imaging Results:  Relevant Lab Results:   Additional Information SSN: [place], [place]

## 2018-03-05 NOTE — Progress Notes (Signed)
Subjective  - POD #2, s/p left CFA atherectomy and PTA, SFA atherectomy and stent  No complaints   Physical Exam:  Right groin soft Dressing in place + doppler signals L LE      Assessment/Plan:  POD #2  ASA/ Plavix, given PTA and stenting Will need outpatient Wound clinic follow up F/U with me in 1 month Will sign off, please call with additional questions   Shelby Reese 03/05/2018 7:52 AM --  Vitals:   03/04/18 2056 03/05/18 0501  BP: (!) 142/54   Pulse:    Resp:    Temp:  98.8 F (37.1 C)  SpO2:      Intake/Output Summary (Last 24 hours) at 03/05/2018 0752 Last data filed at 03/05/2018 0501 Gross per 24 hour  Intake 480 ml  Output 850 ml  Net -370 ml     Laboratory CBC    Component Value Date/Time   WBC 7.5 03/05/2018 0233   HGB 7.2 (L) 03/05/2018 0233   HGB 11.9 04/15/2013 1516   HCT 23.8 (L) 03/05/2018 0233   HCT 38.7 04/15/2013 1516   PLT 275 03/05/2018 0233   PLT 289 04/15/2013 1516    BMET    Component Value Date/Time   NA 135 03/05/2018 0233   NA 133 (A) 12/11/2017   NA 140 04/15/2013 1516   K 4.1 03/05/2018 0233   K 4.3 04/15/2013 1516   CL 104 03/05/2018 0233   CL 105 04/15/2013 1516   CO2 22 03/05/2018 0233   CO2 25 04/15/2013 1516   GLUCOSE 87 03/05/2018 0233   GLUCOSE 94 04/15/2013 1516   BUN 11 03/05/2018 0233   BUN 16 12/11/2017   BUN 28.2 (H) 04/15/2013 1516   CREATININE 1.32 (H) 03/05/2018 0233   CREATININE 1.21 (H) 12/25/2014 1530   CREATININE 1.4 (H) 04/15/2013 1516   CALCIUM 8.8 (L) 03/05/2018 0233   CALCIUM 9.9 04/15/2013 1516   GFRNONAA 37 (L) 03/05/2018 0233   GFRNONAA 43 (L) 12/25/2014 1530   GFRAA 43 (L) 03/05/2018 0233   GFRAA 50 (L) 12/25/2014 1530    COAG Lab Results  Component Value Date   INR 1.10 12/15/2017   INR 1.07 11/12/2017   No results found for: PTT  Antibiotics Anti-infectives (From admission, onward)   Start     Dose/Rate Route Frequency Ordered Stop   02/24/18 1400   aztreonam (AZACTAM) 1 g in sodium chloride 0.9 % 100 mL IVPB  Status:  Discontinued     1 g 200 mL/hr over 30 Minutes Intravenous Every 8 hours 02/24/18 1001 02/24/18 1010   02/24/18 1200  cefTRIAXone (ROCEPHIN) 2 g in sodium chloride 0.9 % 100 mL IVPB     2 g 200 mL/hr over 30 Minutes Intravenous Daily 02/24/18 1012     02/24/18 0400  ceFAZolin (ANCEF) IVPB 1 g/50 mL premix  Status:  Discontinued     1 g 100 mL/hr over 30 Minutes Intravenous Every 12 hours 02/23/18 2145 02/24/18 1001   02/24/18 0000  clindamycin (CLEOCIN) IVPB 600 mg  Status:  Discontinued     600 mg 100 mL/hr over 30 Minutes Intravenous Every 8 hours 02/23/18 2113 03/04/18 1003   02/23/18 1600  clindamycin (CLEOCIN) IVPB 600 mg     600 mg 100 mL/hr over 30 Minutes Intravenous  Once 02/23/18 1553 02/23/18 1804   02/23/18 1600  ceFAZolin (ANCEF) IVPB 1 g/50 mL premix     1 g 100 mL/hr over 30 Minutes Intravenous  Once  02/23/18 1553 02/23/18 1709       V. Leia Alf, M.D. [facility] Office: [phone] Pager:  [phone]

## 2018-03-05 NOTE — Plan of Care (Signed)
  Problem: Clinical Measurements: Goal: Respiratory complications will improve Outcome: Progressing   

## 2018-03-05 NOTE — Progress Notes (Signed)
PROGRESS NOTE    Shelby Reese  RFF:[id number] DOB: 22-Jun-1935 DOA: 02/23/2018 PCP: [name], MD    Brief Narrative:  83 year old female with history of peripheral arterial disease status post right AKA, bilateral severe chronic venous stasis, hypertension, iron deficiency anemia, hypothyroidism 02/23/2018 with acute and chronic anemia with guaiac positive stools and acute kidney injury in the setting of worsening left lower extremity ulcer and infection.  GI was consulted and patient refused any further GI workup.  Vascular surgery was consulted.  Vascular surgery recommended arteriogram after renal function stabilized.  Patient was transferred to [facility] on 03/02/2018.     Assessment & Plan:   Principal Problem:   Foot ulcer ([place]) Active Problems:   Peripheral vascular disease of lower extremity with ulceration (HCC)   Hypothyroidism, acquired   Iron deficiency anemia   Atherosclerosis of artery of right lower extremity (HCC)   Essential hypertension   CKD (chronic kidney disease), stage III (HCC)   Peripheral vascular disease (HCC)   Anemia   Arterioloscleroses   Acute lower UTI   AKI (acute kidney injury) ([place])  #1 left foot ulcer/peripheral vascular disease Patient with left foot ulcer concern for necrotic ulcer.  ABIs were ordered however unable to be tolerated per patient due to pain.  MRI also could not be done.  Patient was seen by vascular surgery Dr. Trula Slade who had initially recommended amputation however patient wanted to exhaust all options prior to decision for amputation.  Patient subsequently underwent arteriogram with atherectomy with drug-coated balloon angioplasty, left common femoral artery, atherectomy with stenting left superficial femoral artery, mechanical thrombectomy, left superficial femoral artery per Dr. Trula Slade 03/03/2018.  Continue current wound care, aspirin, statin, Plavix.  Risk factor modification.  Discontinued IV clindamycin on  03/04/2018.  Discontinue IV Rocephin.  Patient status post approximately 10 days of antibiotics and no further antibiotics needed at this time.  Patient has been seen by vascular surgery and will need to follow-up with them in the outpatient setting.  We will see whether patient can be arranged to be seen at the wound care center in the outpatient setting.   2.  Chronic anemia  Patient seen in consultation by gastroenterology however patient refused any invasive procedures including endoscopy.  GI signed off. Hemoglobin currently at 7.2 from 8.2.  Transfuse for hemoglobin less than 7 or active bleeding.  Repeat H&H this afternoon.  Continue Pepcid, oral iron sulfate, PPI.  Outpatient follow-up with PCP.  3.  Acute kidney injury Improved.  Creatinine slowly trending up however will monitor for stability.  ACE inhibitor and Lasix on hold.  Follow.  4.  Hypertension Stable on current regimen of diltiazem, hydralazine.  ACE inhibitor and diuretics on hold secondary to acute kidney injury.  Follow.  5.  Hypothyroidism Continue Synthroid.  6.  Providencia rettgeri UTI Status post course of IV Rocephin.  No further antibiotics needed.   7.  Obesity  8.  Chronic venous stasis changes Stable.      DVT prophylaxis: Heparin Code Status: Full Family Communication: Updated patient.  No family at bedside. Disposition Plan: Back to ALF with home health versus skilled nursing facility hopefully tomorrow 03/06/2018.   Consultants:   Gastroenterology: Dr.Buccini 02/24/2018  Vascular surgery: Dr. Trula Slade 02/24/2018  Wound care Domenic Moras, RN 02/26/2018  Procedures:  Plain films of the left foot 02/23/2018  Renal ultrasound 02/25/2018  ABIs 02/24/2018--- unsuccessful due to patient intolerance.    1.  Ultrasound-guided access, right femoral artery  2.  Abdominal aortogram with CO2               3.  Left lower extremity runoff               4.  Atherectomy with drug-coated balloon  angioplasty, left common femoral artery               5.  Atherectomy with stenting, left superficial femoral artery               6.  Mechanical thrombectomy, left superficial femoral artery               7.  Conscious sedation (114 minutes)----03/03/2018 per Dr. Trula Slade Antimicrobials:  IV Rocephin 02/24/2018>>>>>>> 03/05/2018  IV clindamycin 02/23/2018>>>>> 03/04/2018   Subjective: Complaining of itching.  Denies any chest pain.  Denies any shortness of breath.  Asking for Benadryl to be changed to as needed as she is not due for Benadryl until 2 PM and currently itching.  No rectal bleeding.   Objective: Vitals:   03/04/18 2014 03/04/18 2056 03/05/18 0501 03/05/18 0824  BP:  (!) 142/54  (!) 157/56  Pulse:    80  Resp:    15  Temp: 98.5 F (36.9 C)  98.8 F (37.1 C) 97.8 F (36.6 C)  TempSrc: Oral  Oral Oral  SpO2:    97%  Weight:      Height:        Intake/Output Summary (Last 24 hours) at 03/05/2018 1018 Last data filed at 03/05/2018 0501 Gross per 24 hour  Intake 480 ml  Output 850 ml  Net -370 ml   Filed Weights   02/23/18 2100 02/24/18 0600 03/02/18 2103  Weight: 81.3 kg (179 lb 3.7 oz) 81.9 kg (180 lb 8.9 oz) 89.9 kg (198 lb 1.6 oz)    Examination:  General exam: Appears calm and comfortable  Respiratory system: Lungs clear to auscultation bilaterally.  No wheezes, no crackles, no rhonchi.  Respiratory effort normal. Cardiovascular system: Regular rate and rhythm with 3/6 SEM. No JVD, rubs, gallops or clicks. No pedal edema. Gastrointestinal system: Abdomen is soft, nontender, nondistended, positive bowel sounds.  No rebound.  No guarding.   Central nervous system: Alert and oriented. No focal neurological deficits. Extremities: Status post right BKA.  Left lower extremity with bandage around the foot.  Skin: No rashes, lesions or ulcers Psychiatry: Judgement and insight appear normal. Mood & affect appropriate.     Data Reviewed: I have personally reviewed  following labs and imaging studies  CBC: Recent Labs  Lab 03/03/18 0919 03/03/18 2213 03/04/18 0015 03/05/18 0233  WBC 6.1 9.6 8.6 7.5  NEUTROABS 3.6  --  6.5  --   HGB 8.0* 8.3* 8.2* 7.2*  HCT 26.3* 27.4* 26.9* 23.8*  MCV 84.8 85.6 86.5 85.0  PLT 309 331 332 161   Basic Metabolic Panel: Recent Labs  Lab 02/28/18 0457 03/02/18 0528 03/03/18 0919 03/04/18 0015 03/05/18 0233  NA 135 136 134* 135 135  K 4.4 4.4 4.3 4.1 4.1  CL 106 105 103 102 104  CO2 21* 21* 22 24 22   GLUCOSE 80 88 78 129* 87  BUN 26* 17 13 12 11   CREATININE 1.13* 1.08* 1.06* 1.14* 1.32*  CALCIUM 9.1 9.9 9.7 9.5 8.8*  MG  --   --   --  1.8 2.1   GFR: Estimated Creatinine Clearance: 36.2 mL/min (A) (by C-G formula based on SCr of 1.32 mg/dL (H)). Liver Function Tests:  No results for input(s): AST, ALT, ALKPHOS, BILITOT, PROT, ALBUMIN in the last 168 hours. No results for input(s): LIPASE, AMYLASE in the last 168 hours. No results for input(s): AMMONIA in the last 168 hours. Coagulation Profile: No results for input(s): INR, PROTIME in the last 168 hours. Cardiac Enzymes: No results for input(s): CKTOTAL, CKMB, CKMBINDEX, TROPONINI in the last 168 hours. BNP (last 3 results) No results for input(s): PROBNP in the last 8760 hours. HbA1C: No results for input(s): HGBA1C in the last 72 hours. CBG: No results for input(s): GLUCAP in the last 168 hours. Lipid Profile: No results for input(s): CHOL, HDL, LDLCALC, TRIG, CHOLHDL, LDLDIRECT in the last 72 hours. Thyroid Function Tests: No results for input(s): TSH, T4TOTAL, FREET4, T3FREE, THYROIDAB in the last 72 hours. Anemia Panel: No results for input(s): VITAMINB12, FOLATE, FERRITIN, TIBC, IRON, RETICCTPCT in the last 72 hours. Sepsis Labs: No results for input(s): PROCALCITON, LATICACIDVEN in the last 168 hours.  Recent Results (from the past 240 hour(s))  MRSA PCR Screening     Status: None   Collection Time: 02/23/18  2:49 PM  Result Value Ref  Range Status   MRSA by PCR NEGATIVE NEGATIVE Final    Comment:        The GeneXpert MRSA Assay (FDA approved for NASAL specimens only), is one component of a comprehensive MRSA colonization surveillance program. It is not intended to diagnose MRSA infection nor to guide or monitor treatment for MRSA infections. Performed at [facility], [place] [address], [place], [place] [address]   Blood culture (routine x 2)     Status: None   Collection Time: 02/23/18  3:23 PM  Result Value Ref Range Status   Specimen Description   Final    BLOOD RIGHT ANTECUBITAL Performed at [facility] [address] [place], [place] [address]    Special Requests   Final    BOTTLES DRAWN AEROBIC AND ANAEROBIC Blood Culture adequate volume Performed at [facility] [address] [place], [place] [address]    Culture   Final    NO GROWTH 5 DAYS Performed at [facility], [place] [address], [place], [place] [address]    Report Status 02/28/2018 FINAL  Final  Urine culture     Status: Abnormal   Collection Time: 02/23/18  9:29 PM  Result Value Ref Range Status   Specimen Description   Final    URINE, RANDOM Performed at [facility] [address] [place], [place] [address]    Special Requests   Final    NONE Performed at [facility], [place] [address] [place], [place] [address]    Culture >=100,000 COLONIES/mL PROVIDENCIA RETTGERI (A)  Final   Report Status 02/26/2018 FINAL  Final   Organism ID, Bacteria PROVIDENCIA RETTGERI (A)  Final      Susceptibility   Providencia rettgeri - MIC*    AMPICILLIN RESISTANT Resistant     CEFAZOLIN >=64 RESISTANT Resistant     CEFTRIAXONE <=1 SENSITIVE Sensitive     CIPROFLOXACIN <=0.25 SENSITIVE Sensitive     GENTAMICIN <=1 SENSITIVE Sensitive     IMIPENEM 1 SENSITIVE Sensitive     NITROFURANTOIN 128 RESISTANT Resistant     TRIMETH/SULFA <=20 SENSITIVE  Sensitive     AMPICILLIN/SULBACTAM 16 INTERMEDIATE Intermediate     PIP/TAZO <=4 SENSITIVE Sensitive     * >=100,000 COLONIES/mL PROVIDENCIA RETTGERI  Blood culture (routine x 2)  Status: None   Collection Time: 02/24/18  3:33 AM  Result Value Ref Range Status   Specimen Description   Final    BLOOD RIGHT HAND Performed at [facility] [address] [place], [place] [address]    Special Requests   Final    BOTTLES DRAWN AEROBIC AND ANAEROBIC Blood Culture adequate volume Performed at [facility] [address] [place], [place] [address]    Culture   Final    NO GROWTH 5 DAYS Performed at [facility], [place] [address], [place], [place] [address]    Report Status 03/01/2018 FINAL  Final         Radiology Studies: No results found.      Scheduled Meds: . amitriptyline  50 mg Oral QHS  . aspirin EC  81 mg Oral Daily  . atorvastatin  10 mg Oral q1800  . calcium-vitamin D  1 tablet Oral Q breakfast  . clopidogrel  75 mg Oral Q breakfast  . diltiazem  120 mg Oral Q breakfast  . diphenhydrAMINE  25 mg Oral Q8H  . docusate sodium  100 mg Oral Daily  . escitalopram  10 mg Oral Daily  . famotidine  20 mg Oral Q1500  . feeding supplement (ENSURE ENLIVE)  237 mL Oral TID WC  . fentaNYL  25 mcg Transdermal Q72H  . ferrous sulfate  325 mg Oral BID WC  . heparin  5,000 Units Subcutaneous Q8H  . hydrALAZINE  50 mg Oral QID  . levothyroxine  175 mcg Oral QAC breakfast  . loratadine  10 mg Oral Daily  . multivitamin with minerals  1 tablet Oral Daily  . oxyCODONE  5 mg Oral Q4H  . pantoprazole  40 mg Oral Daily  . polyethylene glycol  17 g Oral BID  . senna-docusate  2 tablet Oral BID  . sodium chloride flush  3 mL Intravenous Q12H  . vitamin C  500 mg Oral Daily   Continuous Infusions: . sodium chloride 75 mL/hr at 03/05/18 0203  . sodium chloride       LOS: 10 days    Time spent: 35 minutes    Irine Seal,  MD [facility] Pager [phone]  If 7PM-7AM, please contact night-coverage [email or web address] Password [email or web address] 03/05/2018, 10:18 AM

## 2018-03-06 LAB — BASIC METABOLIC PANEL
Anion gap: 8 (ref 5–15)
BUN: 15 mg/dL (ref 6–20)
CHLORIDE: 105 mmol/L (ref 101–111)
CO2: 21 mmol/L — AB (ref 22–32)
Calcium: 9.1 mg/dL (ref 8.9–10.3)
Creatinine, Ser: 1.45 mg/dL — ABNORMAL HIGH (ref 0.44–1.00)
GFR calc non Af Amer: 33 mL/min — ABNORMAL LOW (ref >60)
GFR, EST AFRICAN AMERICAN: 38 mL/min — AB (ref >60)
Glucose, Bld: 122 mg/dL — ABNORMAL HIGH (ref 65–99)
Potassium: 4.6 mmol/L (ref 3.5–5.1)
Sodium: 134 mmol/L — ABNORMAL LOW (ref 135–145)

## 2018-03-06 LAB — CBC
HEMATOCRIT: 24.2 % — AB (ref 36.0–46.0)
HEMOGLOBIN: 7.3 g/dL — AB (ref 12.0–15.0)
MCH: 26.5 pg (ref 26.0–34.0)
MCHC: 30.2 g/dL (ref 30.0–36.0)
MCV: 88 fL (ref 78.0–100.0)
Platelets: 304 10*3/uL (ref 150–400)
RBC: 2.75 MIL/uL — AB (ref 3.87–5.11)
RDW: 16.3 % — ABNORMAL HIGH (ref 11.5–15.5)
WBC: 7.8 10*3/uL (ref 4.0–10.5)

## 2018-03-06 MED ORDER — SODIUM CHLORIDE 0.9 % IV BOLUS
500.0000 mL | Freq: Once | INTRAVENOUS | Status: AC
  Administered 2018-03-06: 500 mL via INTRAVENOUS

## 2018-03-06 NOTE — Plan of Care (Signed)
  Problem: Clinical Measurements: Goal: Ability to maintain clinical measurements within normal limits will improve Outcome: Progressing Goal: Diagnostic test results will improve Outcome: Progressing   Problem: Coping: Goal: Level of anxiety will decrease Outcome: Progressing   

## 2018-03-06 NOTE — Progress Notes (Signed)
Left foot and heel wound changed. Moderate yellow slough noted with patchy black scattered slough to wound edges. Area cleaned with NS and patted dry with 4x4s. Wound covered per orders with Xeroform, 4x4 dressings, Kerlix, and tape. Patient tolerated well. Slight odor noted to wound base.

## 2018-03-06 NOTE — Progress Notes (Signed)
PROGRESS NOTE    Shelby Reese  JHE:[id number] DOB: [birth year] DOA: 02/23/2018 PCP: [name], MD    Brief Narrative:  83 year old female with history of peripheral arterial disease status post right AKA, bilateral severe chronic venous stasis, hypertension, iron deficiency anemia, hypothyroidism 02/23/2018 with acute and chronic anemia with guaiac positive stools and acute kidney injury in the setting of worsening left lower extremity ulcer and infection.  GI was consulted and patient refused any further GI workup.  Vascular surgery was consulted.  Vascular surgery recommended arteriogram after renal function stabilized.  Patient was transferred to [facility] on 03/02/2018.     Assessment & Plan:   Principal Problem:   Foot ulcer ([place]) Active Problems:   Peripheral vascular disease of lower extremity with ulceration (HCC)   Hypothyroidism, acquired   Iron deficiency anemia   Atherosclerosis of artery of right lower extremity (HCC)   Essential hypertension   CKD (chronic kidney disease), stage III (HCC)   Peripheral vascular disease (HCC)   Anemia   Arterioloscleroses   Acute lower UTI   AKI (acute kidney injury) ([place])  #1 left foot ulcer/peripheral vascular disease Patient with left foot ulcer concern for necrotic ulcer.  ABIs were ordered however unable to be tolerated per patient due to pain.  MRI also could not be done.  Patient was seen by vascular surgery Dr. Trula Slade who had initially recommended amputation however patient wanted to exhaust all options prior to decision for amputation.  Patient subsequently underwent arteriogram with atherectomy with drug-coated balloon angioplasty, left common femoral artery, atherectomy with stenting left superficial femoral artery, mechanical thrombectomy, left superficial femoral artery per Dr. Trula Slade 03/03/2018.  Continue current wound care, aspirin, statin, Plavix.  Risk factor modification.  Discontinued IV clindamycin on  03/04/2018.  Discontinued IV Rocephin on 03/05/2018.  Patient status post approximately 10 days of antibiotics and no further antibiotics needed at this time.  Patient has been seen by vascular surgery and will need to follow-up with them in the outpatient setting.  Patient will need outpatient wound care.   2.  Chronic anemia  Patient seen in consultation by gastroenterology however patient refused any invasive procedures including endoscopy.  GI signed off. Hemoglobin currently at 7.3 from 7.5 from 7.2 from 8.2.  Transfuse for hemoglobin less than 7 or active bleeding.  Continue PPI, iron supplementation, Pepcid.  Outpatient follow-up with PCP.   3.  Acute kidney injury Improved.  Creatinine slowly trending up.  ACE inhibitor and Lasix on hold.  Patient status post angiogram which utilized contrast.  We will give a bolus of IV fluids.  Increase normal saline to 100 cc/h.  Monitor renal function.   4.  Hypertension Continue current regimen of diltiazem, hydralazine.  ACE inhibitor and diuretics on hold secondary to acute kidney injury.  Follow.  5.  Hypothyroidism Synthroid.  Outpatient follow-up.   6.  Providencia rettgeri UTI Status post course of IV Rocephin.  No further antibiotics needed.   7.  Obesity  8.  Chronic venous stasis changes Stable.      DVT prophylaxis: Heparin Code Status: Full Family Communication: Updated patient.  No family at bedside. Disposition Plan: Back to ALF with home health once renal function stabilizes and not worsening.     Consultants:   Gastroenterology: Dr.Buccini 02/24/2018  Vascular surgery: Dr. Trula Slade 02/24/2018  Wound care Domenic Moras, RN 02/26/2018  Procedures:  Plain films of the left foot 02/23/2018  Renal ultrasound 02/25/2018  ABIs 02/24/2018--- unsuccessful due to  patient intolerance.    1.  Ultrasound-guided access, right femoral artery               2.  Abdominal aortogram with CO2               3.  Left lower extremity runoff                4.  Atherectomy with drug-coated balloon angioplasty, left common femoral artery               5.  Atherectomy with stenting, left superficial femoral artery               6.  Mechanical thrombectomy, left superficial femoral artery               7.  Conscious sedation (114 minutes)----03/03/2018 per Dr. Trula Slade Antimicrobials:  IV Rocephin 02/24/2018>>>>>>> 03/05/2018  IV clindamycin 02/23/2018>>>>> 03/04/2018   Subjective: Denies chest pain.  No shortness of breath.  On Benadryl as needed for itching.  No bleeding.  Patient states has good urine output.    Objective: Vitals:   03/05/18 1601 03/05/18 1733 03/05/18 2048 03/06/18 0502  BP: 120/84 (!) 126/50    Pulse:      Resp:   18 (!) 22  Temp:   97.7 F (36.5 C) 97.7 F (36.5 C)  TempSrc:   Oral Oral  SpO2:      Weight:      Height:        Intake/Output Summary (Last 24 hours) at 03/06/2018 1209 Last data filed at 03/06/2018 0548 Gross per 24 hour  Intake 360 ml  Output 950 ml  Net -590 ml   Filed Weights   02/23/18 2100 02/24/18 0600 03/02/18 2103  Weight: 81.3 kg (179 lb 3.7 oz) 81.9 kg (180 lb 8.9 oz) 89.9 kg (198 lb 1.6 oz)    Examination:  General exam: NAD Respiratory system: CTA B.  No rhonchi, no crackles, no wheezing.  Respiratory effort normal. Cardiovascular system: RRR with 3/6 SEM. No JVD, rubs, gallops or clicks. No pedal edema. Gastrointestinal system: Abdomen is nontender, nondistended, soft, positive bowel sounds.  No rebound.  No guarding.  Central nervous system: Alert and oriented. No focal neurological deficits. Extremities: Status post right BKA.  Left lower extremity with bandage around the foot with some drainage noted on bandage.  Skin: No rashes, lesions or ulcers Psychiatry: Judgement and insight appear normal. Mood & affect appropriate.     Data Reviewed: I have personally reviewed following labs and imaging studies  CBC: Recent Labs  Lab 03/03/18 0919 03/03/18 2213  03/04/18 0015 03/05/18 0233 03/05/18 1604 03/06/18 0157  WBC 6.1 9.6 8.6 7.5  --  7.8  NEUTROABS 3.6  --  6.5  --   --   --   HGB 8.0* 8.3* 8.2* 7.2* 7.5* 7.3*  HCT 26.3* 27.4* 26.9* 23.8* 25.1* 24.2*  MCV 84.8 85.6 86.5 85.0  --  88.0  PLT 309 331 332 275  --  606   Basic Metabolic Panel: Recent Labs  Lab 03/02/18 0528 03/03/18 0919 03/04/18 0015 03/05/18 0233 03/06/18 0157  NA 136 134* 135 135 134*  K 4.4 4.3 4.1 4.1 4.6  CL 105 103 102 104 105  CO2 21* 22 24 22  21*  GLUCOSE 88 78 129* 87 122*  BUN 17 13 12 11 15   CREATININE 1.08* 1.06* 1.14* 1.32* 1.45*  CALCIUM 9.9 9.7 9.5 8.8* 9.1  MG  --   --  1.8 2.1  --    GFR: Estimated Creatinine Clearance: 32.9 mL/min (A) (by C-G formula based on SCr of 1.45 mg/dL (H)). Liver Function Tests: No results for input(s): AST, ALT, ALKPHOS, BILITOT, PROT, ALBUMIN in the last 168 hours. No results for input(s): LIPASE, AMYLASE in the last 168 hours. No results for input(s): AMMONIA in the last 168 hours. Coagulation Profile: No results for input(s): INR, PROTIME in the last 168 hours. Cardiac Enzymes: No results for input(s): CKTOTAL, CKMB, CKMBINDEX, TROPONINI in the last 168 hours. BNP (last 3 results) No results for input(s): PROBNP in the last 8760 hours. HbA1C: No results for input(s): HGBA1C in the last 72 hours. CBG: No results for input(s): GLUCAP in the last 168 hours. Lipid Profile: No results for input(s): CHOL, HDL, LDLCALC, TRIG, CHOLHDL, LDLDIRECT in the last 72 hours. Thyroid Function Tests: No results for input(s): TSH, T4TOTAL, FREET4, T3FREE, THYROIDAB in the last 72 hours. Anemia Panel: No results for input(s): VITAMINB12, FOLATE, FERRITIN, TIBC, IRON, RETICCTPCT in the last 72 hours. Sepsis Labs: No results for input(s): PROCALCITON, LATICACIDVEN in the last 168 hours.  No results found for this or any previous visit (from the past 240 hour(s)).       Radiology Studies: No results  found.      Scheduled Meds: . amitriptyline  50 mg Oral QHS  . aspirin EC  81 mg Oral Daily  . atorvastatin  10 mg Oral q1800  . calcium-vitamin D  1 tablet Oral Q breakfast  . clopidogrel  75 mg Oral Q breakfast  . diltiazem  120 mg Oral Q breakfast  . docusate sodium  100 mg Oral Daily  . escitalopram  10 mg Oral Daily  . famotidine  20 mg Oral Q1500  . feeding supplement (ENSURE ENLIVE)  237 mL Oral TID WC  . fentaNYL  25 mcg Transdermal Q72H  . ferrous sulfate  325 mg Oral BID WC  . heparin  5,000 Units Subcutaneous Q8H  . hydrALAZINE  50 mg Oral QID  . hydrocortisone cream   Topical BID  . levothyroxine  175 mcg Oral QAC breakfast  . loratadine  10 mg Oral Daily  . multivitamin with minerals  1 tablet Oral Daily  . oxyCODONE  5 mg Oral Q4H  . pantoprazole  40 mg Oral Daily  . polyethylene glycol  17 g Oral BID  . senna-docusate  2 tablet Oral BID  . sodium chloride flush  3 mL Intravenous Q12H  . vitamin C  500 mg Oral Daily   Continuous Infusions: . sodium chloride 100 mL/hr at 03/06/18 0900  . sodium chloride       LOS: 11 days    Time spent: 35 minutes    Irine Seal, MD [facility] Pager [phone]  If 7PM-7AM, please contact night-coverage [email or web address] Password [facility] 03/06/2018, 12:09 PM

## 2018-03-07 ENCOUNTER — Inpatient Hospital Stay (HOSPITAL_COMMUNITY): Payer: Medicare Other

## 2018-03-07 LAB — CBC WITH DIFFERENTIAL/PLATELET
BASOS PCT: 0 %
Basophils Absolute: 0 10*3/uL (ref 0.0–0.1)
EOS ABS: 0.5 10*3/uL (ref 0.0–0.7)
EOS PCT: 5 %
HCT: 30.2 % — ABNORMAL LOW (ref 36.0–46.0)
Hemoglobin: 9.4 g/dL — ABNORMAL LOW (ref 12.0–15.0)
LYMPHS ABS: 0.9 10*3/uL (ref 0.7–4.0)
Lymphocytes Relative: 9 %
MCH: 26.4 pg (ref 26.0–34.0)
MCHC: 31.1 g/dL (ref 30.0–36.0)
MCV: 84.8 fL (ref 78.0–100.0)
Monocytes Absolute: 0.7 10*3/uL (ref 0.1–1.0)
Monocytes Relative: 7 %
Neutro Abs: 7.8 10*3/uL — ABNORMAL HIGH (ref 1.7–7.7)
Neutrophils Relative %: 79 %
PLATELETS: 305 10*3/uL (ref 150–400)
RBC: 3.56 MIL/uL — AB (ref 3.87–5.11)
RDW: 15.3 % (ref 11.5–15.5)
WBC: 10 10*3/uL (ref 4.0–10.5)

## 2018-03-07 LAB — HEMOGLOBIN AND HEMATOCRIT, BLOOD
HCT: 22.5 % — ABNORMAL LOW (ref 36.0–46.0)
HEMOGLOBIN: 6.9 g/dL — AB (ref 12.0–15.0)

## 2018-03-07 LAB — BASIC METABOLIC PANEL
Anion gap: 8 (ref 5–15)
BUN: 20 mg/dL (ref 6–20)
CALCIUM: 9 mg/dL (ref 8.9–10.3)
CO2: 22 mmol/L (ref 22–32)
CREATININE: 1.27 mg/dL — AB (ref 0.44–1.00)
Chloride: 104 mmol/L (ref 101–111)
GFR calc non Af Amer: 39 mL/min — ABNORMAL LOW (ref >60)
GFR, EST AFRICAN AMERICAN: 45 mL/min — AB (ref >60)
Glucose, Bld: 102 mg/dL — ABNORMAL HIGH (ref 65–99)
Potassium: 4.4 mmol/L (ref 3.5–5.1)
Sodium: 134 mmol/L — ABNORMAL LOW (ref 135–145)

## 2018-03-07 LAB — PREPARE RBC (CROSSMATCH)

## 2018-03-07 MED ORDER — SORBITOL 70 % SOLN
30.0000 mL | Freq: Once | Status: DC
  Filled 2018-03-07: qty 30

## 2018-03-07 MED ORDER — FUROSEMIDE 10 MG/ML IJ SOLN
40.0000 mg | Freq: Two times a day (BID) | INTRAMUSCULAR | Status: AC
  Administered 2018-03-07 – 2018-03-08 (×2): 40 mg via INTRAVENOUS
  Filled 2018-03-07 (×2): qty 4

## 2018-03-07 MED ORDER — FUROSEMIDE 10 MG/ML IJ SOLN
40.0000 mg | Freq: Once | INTRAMUSCULAR | Status: AC
  Administered 2018-03-07: 40 mg via INTRAVENOUS
  Filled 2018-03-07: qty 4

## 2018-03-07 MED ORDER — SODIUM CHLORIDE 0.9 % IV SOLN
Freq: Once | INTRAVENOUS | Status: AC
  Administered 2018-03-07: 05:00:00 via INTRAVENOUS

## 2018-03-07 MED ORDER — LORAZEPAM 0.5 MG PO TABS
0.5000 mg | ORAL_TABLET | Freq: Two times a day (BID) | ORAL | Status: DC | PRN
  Administered 2018-03-07 – 2018-03-17 (×8): 0.5 mg via ORAL
  Filled 2018-03-07 (×8): qty 1

## 2018-03-07 NOTE — Progress Notes (Addendum)
PROGRESS NOTE    Shelby Reese  KGS:[id number] DOB: [birth year] DOA: 02/23/2018 PCP: [name], MD    Brief Narrative:  83 year old female with history of peripheral arterial disease status post right AKA, bilateral severe chronic venous stasis, hypertension, iron deficiency anemia, hypothyroidism 02/23/2018 with acute and chronic anemia with guaiac positive stools and acute kidney injury in the setting of worsening left lower extremity ulcer and infection.  GI was consulted and patient refused any further GI workup.  Vascular surgery was consulted.  Vascular surgery recommended arteriogram after renal function stabilized.  Patient was transferred to [facility] on 03/02/2018.     Assessment & Plan:   Principal Problem:   Foot ulcer ([place]) Active Problems:   Peripheral vascular disease of lower extremity with ulceration (HCC)   Hypothyroidism, acquired   Iron deficiency anemia   Atherosclerosis of artery of right lower extremity (HCC)   Essential hypertension   CKD (chronic kidney disease), stage III (HCC)   Peripheral vascular disease (HCC)   Anemia   Arterioloscleroses   Acute lower UTI   AKI (acute kidney injury) ([place])  #1 left foot ulcer/peripheral vascular disease Patient with left foot ulcer concern for necrotic ulcer.  ABIs were ordered however unable to be tolerated per patient due to pain.  MRI also could not be done.  Patient was seen by vascular surgery Dr. Trula Slade who had initially recommended amputation however patient wanted to exhaust all options prior to decision for amputation.  Patient subsequently underwent arteriogram with atherectomy with drug-coated balloon angioplasty, left common femoral artery, atherectomy with stenting left superficial femoral artery, mechanical thrombectomy, left superficial femoral artery per Dr. Trula Slade 03/03/2018.  Continue current wound care, aspirin, statin, Plavix.  Risk factor modification.  Discontinued IV clindamycin on  03/04/2018.  Discontinued IV Rocephin on 03/05/2018.  Patient status post approximately 10 days of antibiotics and no further antibiotics needed at this time.  Patient has been seen by vascular surgery and will need to follow-up with them in the outpatient setting.  Patient will need outpatient wound care.   2.  Chronic anemia  Patient seen in consultation by gastroenterology however patient refused any invasive procedures including endoscopy.  GI signed off. Hemoglobin currently at 6.9 from 7.3 from 7.5 from 7.2 from 8.2.  Patient refuses any active bleeding.  Transfuse for hemoglobin less than 7 or active bleeding.  Continue PPI, iron supplementation, Pepcid.  Patient to be transfused 2 units of packed red blood cells today.  Follow H&H.  Outpatient follow-up with PCP.   3.  Acute kidney injury Initially likely secondary to a prerenal azotemia in the setting of contrast which was used during angiogram.  Patient also noted to be on ACE inhibitor and Lasix on admission which have subsequently dating discontinued.  Patient status post angiogram which utilized contrast.  Patient was given a bolus of IV fluids yesterday and IV fluids increased to 100 cc/h.  Renal function improving.  Saline lock IV fluids.   4.  Hypertension Continue current regimen of diltiazem, hydralazine.  ACE inhibitor and diuretics on hold secondary to acute kidney injury.  Follow.  5.  Hypothyroidism TSH is 0.298 on 09/09/2017.  Repeat TSH.  Continue home dose Synthroid.    6.  Providencia rettgeri UTI Status post course of IV Rocephin.  No further antibiotics needed.   7.  Obesity  8.  Chronic venous stasis changes Stable.  9.  Dyspnea Likely secondary to volume overload as patient given IV fluids due to  concerns for acute renal failure on admission and hypovolemia.  Patient also noted to undergo a angiogram which did have contrast and it may be contributing to slightly worsening renal function.  Check a chest x-ray.  Give  a dose of Lasix 40 mg IV x1 in between blood transfusions.  Follow.  10.  Constipation Sorbitol p.o. x1.  Place on MiraLAX twice daily and Senokot as twice daily.      DVT prophylaxis: Heparin Code Status: Full Family Communication: Updated patient.  No family at bedside. Disposition Plan: Back to ALF with home health once renal function stabilizes and anemia resolved and not worsening.     Consultants:   Gastroenterology: Dr.Buccini 02/24/2018  Vascular surgery: Dr. Trula Slade 02/24/2018  Wound care Domenic Moras, RN 02/26/2018  Procedures:  Plain films of the left foot 02/23/2018  Renal ultrasound 02/25/2018  ABIs 02/24/2018--- unsuccessful due to patient intolerance.    1.  Ultrasound-guided access, right femoral artery               2.  Abdominal aortogram with CO2               3.  Left lower extremity runoff               4.  Atherectomy with drug-coated balloon angioplasty, left common femoral artery               5.  Atherectomy with stenting, left superficial femoral artery               6.  Mechanical thrombectomy, left superficial femoral artery               7.  Conscious sedation (114 minutes)----03/03/2018 per Dr. Trula Slade Transfuse 2 units packed red blood cells 03/07/2018 Antimicrobials:  IV Rocephin 02/24/2018>>>>>>> 03/05/2018  IV clindamycin 02/23/2018>>>>> 03/04/2018   Subjective: Patient tearful.  States she is feeling short of breath.  Denies any chest pain.  Patient complaining of constipation.  Objective: Vitals:   03/07/18 0821 03/07/18 0915 03/07/18 0919 03/07/18 1156  BP: (!) 152/56 (!) 168/62 (!) 168/62 (!) 173/53  Pulse: 73 74  83  Resp:  18  18  Temp: 98.3 F (36.8 C) 98.4 F (36.9 C)  98.4 F (36.9 C)  TempSrc: Oral Oral    SpO2: 98% 100%  98%  Weight:      Height:        Intake/Output Summary (Last 24 hours) at 03/07/2018 1211 Last data filed at 03/07/2018 1156 Gross per 24 hour  Intake 780 ml  Output 1050 ml  Net -270 ml   Filed Weights     02/23/18 2100 02/24/18 0600 03/02/18 2103  Weight: 81.3 kg (179 lb 3.7 oz) 81.9 kg (180 lb 8.9 oz) 89.9 kg (198 lb 1.6 oz)    Examination:  General exam: Tearful. Respiratory system: Diffuse crackles.  No rhonchi.  No wheezing. Cardiovascular system: RRR with 3/6 SEM. No rubs, gallops or clicks.  Trace to 1+ left lower extremity edema.  Right stump with some swelling.  Gastrointestinal system: Abdomen is soft, nontender, nondistended, positive bowel sounds.  No rebound.  No guarding.   Central nervous system: Alert and oriented x3. No focal neurological deficits. Extremities: Status post right BKA.  Left lower extremity with bandage around the foot with some drainage noted on bandage.  Skin: No rashes, lesions or ulcers Psychiatry: Judgement and insight appear normal. Mood & affect appropriate.     Data Reviewed: I have personally reviewed  following labs and imaging studies  CBC: Recent Labs  Lab 03/03/18 0919 03/03/18 2213 03/04/18 0015 03/05/18 0233 03/05/18 1604 03/06/18 0157 03/07/18 0249  WBC 6.1 9.6 8.6 7.5  --  7.8  --   NEUTROABS 3.6  --  6.5  --   --   --   --   HGB 8.0* 8.3* 8.2* 7.2* 7.5* 7.3* 6.9*  HCT 26.3* 27.4* 26.9* 23.8* 25.1* 24.2* 22.5*  MCV 84.8 85.6 86.5 85.0  --  88.0  --   PLT 309 331 332 275  --  304  --    Basic Metabolic Panel: Recent Labs  Lab 03/03/18 0919 03/04/18 0015 03/05/18 0233 03/06/18 0157 03/07/18 0249  NA 134* 135 135 134* 134*  K 4.3 4.1 4.1 4.6 4.4  CL 103 102 104 105 104  CO2 22 24 22  21* 22  GLUCOSE 78 129* 87 122* 102*  BUN 13 12 11 15 20   CREATININE 1.06* 1.14* 1.32* 1.45* 1.27*  CALCIUM 9.7 9.5 8.8* 9.1 9.0  MG  --  1.8 2.1  --   --    GFR: Estimated Creatinine Clearance: 37.6 mL/min (A) (by C-G formula based on SCr of 1.27 mg/dL (H)). Liver Function Tests: No results for input(s): AST, ALT, ALKPHOS, BILITOT, PROT, ALBUMIN in the last 168 hours. No results for input(s): LIPASE, AMYLASE in the last 168 hours. No  results for input(s): AMMONIA in the last 168 hours. Coagulation Profile: No results for input(s): INR, PROTIME in the last 168 hours. Cardiac Enzymes: No results for input(s): CKTOTAL, CKMB, CKMBINDEX, TROPONINI in the last 168 hours. BNP (last 3 results) No results for input(s): PROBNP in the last 8760 hours. HbA1C: No results for input(s): HGBA1C in the last 72 hours. CBG: No results for input(s): GLUCAP in the last 168 hours. Lipid Profile: No results for input(s): CHOL, HDL, LDLCALC, TRIG, CHOLHDL, LDLDIRECT in the last 72 hours. Thyroid Function Tests: No results for input(s): TSH, T4TOTAL, FREET4, T3FREE, THYROIDAB in the last 72 hours. Anemia Panel: No results for input(s): VITAMINB12, FOLATE, FERRITIN, TIBC, IRON, RETICCTPCT in the last 72 hours. Sepsis Labs: No results for input(s): PROCALCITON, LATICACIDVEN in the last 168 hours.  No results found for this or any previous visit (from the past 240 hour(s)).       Radiology Studies: No results found.      Scheduled Meds: . amitriptyline  50 mg Oral QHS  . aspirin EC  81 mg Oral Daily  . atorvastatin  10 mg Oral q1800  . calcium-vitamin D  1 tablet Oral Q breakfast  . clopidogrel  75 mg Oral Q breakfast  . diltiazem  120 mg Oral Q breakfast  . docusate sodium  100 mg Oral Daily  . escitalopram  10 mg Oral Daily  . famotidine  20 mg Oral Q1500  . feeding supplement (ENSURE ENLIVE)  237 mL Oral TID WC  . fentaNYL  25 mcg Transdermal Q72H  . ferrous sulfate  325 mg Oral BID WC  . heparin  5,000 Units Subcutaneous Q8H  . hydrALAZINE  50 mg Oral QID  . hydrocortisone cream   Topical BID  . levothyroxine  175 mcg Oral QAC breakfast  . loratadine  10 mg Oral Daily  . multivitamin with minerals  1 tablet Oral Daily  . oxyCODONE  5 mg Oral Q4H  . pantoprazole  40 mg Oral Daily  . polyethylene glycol  17 g Oral BID  . senna-docusate  2 tablet Oral BID  .  sodium chloride flush  3 mL Intravenous Q12H  . sorbitol   30 mL Oral Once  . vitamin C  500 mg Oral Daily   Continuous Infusions: . sodium chloride       LOS: 12 days    Time spent: 35 minutes    Irine Seal, MD [facility] Pager [phone]  If 7PM-7AM, please contact night-coverage [email or web address] Password [facility] 03/07/2018, 12:11 PM

## 2018-03-07 NOTE — Progress Notes (Signed)
CRITICAL VALUE ALERT  Critical Value:  HGB 6.9  Date & Time Notied: 03/07/18 at 0414  Provider Notified: Kennon Holter  Orders Received/Actions taken: orders pending

## 2018-03-07 NOTE — Progress Notes (Signed)
Clinical Social Worker following patient for discharge needs. Patient is from [facility], facility stated that they will be unable to take patient back this weekend. Facility stated that patient will need to be assessed by facility before being allowed back. Facility stated due to the note written by [facility] Physical Therapist stating patient is unable to use sliding board to transfer they are afraid facility will not be able to handle patients care. CSW spoke with patient at bedside to make her aware that facility will be unable to take her and the reasoning to why. Patient stated the reason she was unable to use sliding board with PT during their assessment was because she was in pain after her surgery.   Facility will need to meet with patient at bedside Monday to assess her.  Rhea Pink, MSW,  [place] \

## 2018-03-08 LAB — BASIC METABOLIC PANEL
ANION GAP: 12 (ref 5–15)
BUN: 23 mg/dL — ABNORMAL HIGH (ref 6–20)
CALCIUM: 9.5 mg/dL (ref 8.9–10.3)
CHLORIDE: 99 mmol/L — AB (ref 101–111)
CO2: 24 mmol/L (ref 22–32)
CREATININE: 1.17 mg/dL — AB (ref 0.44–1.00)
GFR calc non Af Amer: 43 mL/min — ABNORMAL LOW (ref >60)
GFR, EST AFRICAN AMERICAN: 50 mL/min — AB (ref >60)
Glucose, Bld: 80 mg/dL (ref 65–99)
Potassium: 3.7 mmol/L (ref 3.5–5.1)
SODIUM: 135 mmol/L (ref 135–145)

## 2018-03-08 LAB — CBC
HEMATOCRIT: 28.7 % — AB (ref 36.0–46.0)
HEMOGLOBIN: 9.2 g/dL — AB (ref 12.0–15.0)
MCH: 27.1 pg (ref 26.0–34.0)
MCHC: 32.1 g/dL (ref 30.0–36.0)
MCV: 84.4 fL (ref 78.0–100.0)
Platelets: 322 10*3/uL (ref 150–400)
RBC: 3.4 MIL/uL — ABNORMAL LOW (ref 3.87–5.11)
RDW: 15.7 % — ABNORMAL HIGH (ref 11.5–15.5)
WBC: 9.3 10*3/uL (ref 4.0–10.5)

## 2018-03-08 LAB — TSH: TSH: 4.422 u[IU]/mL (ref 0.350–4.500)

## 2018-03-08 MED ORDER — HYDRALAZINE HCL 50 MG PO TABS
75.0000 mg | ORAL_TABLET | Freq: Four times a day (QID) | ORAL | Status: DC
  Administered 2018-03-08 – 2018-03-09 (×4): 75 mg via ORAL
  Filled 2018-03-08 (×3): qty 1

## 2018-03-08 MED ORDER — FUROSEMIDE 10 MG/ML IJ SOLN
40.0000 mg | Freq: Two times a day (BID) | INTRAMUSCULAR | Status: AC
  Administered 2018-03-08 – 2018-03-09 (×2): 40 mg via INTRAVENOUS
  Filled 2018-03-08: qty 4

## 2018-03-08 MED ORDER — FUROSEMIDE 10 MG/ML IJ SOLN
40.0000 mg | Freq: Once | INTRAMUSCULAR | Status: DC
  Filled 2018-03-08: qty 4

## 2018-03-08 MED ORDER — FUROSEMIDE 20 MG PO TABS: 20.0000 mg | ORAL_TABLET | Freq: Every day | ORAL | Status: DC

## 2018-03-08 NOTE — Progress Notes (Signed)
PROGRESS NOTE    Shelby Reese  HCW:[id number] DOB: 24-Oct-1935 DOA: 02/23/2018 PCP: [name], MD    Brief Narrative:  83 year old female with history of peripheral arterial disease status post right AKA, bilateral severe chronic venous stasis, hypertension, iron deficiency anemia, hypothyroidism 02/23/2018 with acute and chronic anemia with guaiac positive stools and acute kidney injury in the setting of worsening left lower extremity ulcer and infection.  GI was consulted and patient refused any further GI workup.  Vascular surgery was consulted.  Vascular surgery recommended arteriogram after renal function stabilized.  Patient was transferred to [facility] on 03/02/2018.     Assessment & Plan:   Principal Problem:   Foot ulcer ([place]) Active Problems:   Peripheral vascular disease of lower extremity with ulceration (HCC)   Hypothyroidism, acquired   Iron deficiency anemia   Atherosclerosis of artery of right lower extremity (HCC)   Essential hypertension   CKD (chronic kidney disease), stage III (HCC)   Peripheral vascular disease (HCC)   Anemia   Arterioloscleroses   Acute lower UTI   AKI (acute kidney injury) ([place])  #1 left foot ulcer/peripheral vascular disease Patient with left foot ulcer concerning for necrotic ulcer.  ABIs were ordered however unable to be tolerated per patient due to pain.  MRI also could not be done.  Patient was seen by vascular surgery Dr. Trula Slade who had initially recommended amputation however patient wanted to exhaust all options prior to decision for amputation.  Patient subsequently underwent arteriogram with atherectomy with drug-coated balloon angioplasty, left common femoral artery, atherectomy with stenting left superficial femoral artery, mechanical thrombectomy, left superficial femoral artery per Dr. Trula Slade 03/03/2018.  Continue current wound care, aspirin, statin, Plavix.  Risk factor modification.  Discontinued IV clindamycin on  03/04/2018.  Discontinued IV Rocephin on 03/05/2018.  Patient status post approximately 10 days of antibiotics and no further antibiotics needed at this time.  Patient has been seen by vascular surgery and will need to follow-up with them in the outpatient setting.  Patient will need outpatient wound care.   2.  Chronic anemia  Patient seen in consultation by gastroenterology however patient refused any invasive procedures including endoscopy.  GI signed off. Hemoglobin currently at 9.2 from 6.9 from 7.3 from 7.5 from 7.2 from 8.2.  Patient was transfused 2 units of packed red blood cells on 03/07/2018.  Patient denies any overt bleeding.  Transfusion threshold hemoglobin less than 7.  Continue PPI, iron supplementation, Pepcid.  Follow H&H.   3.  Acute kidney injury Initially likely secondary to a prerenal azotemia in the setting of contrast which was used during angiogram.  Patient also noted to be on ACE inhibitor and Lasix on admission which were discontinued.  Patient status post angiogram which utilized contrast.  Patient was given a bolus of IV fluids and IV fluids increased to 100 cc/h.  Patient noted to be volume overloaded on 03/07/2018 and as such IV fluids have been saline locked.  Patient placed on IV Lasix with good urine output.  Renal function improved.  Follow with diuresis.  Would not resume ACE inhibitor at this time.   4.  Hypertension Blood pressure elevated this morning however patient has not gotten her medications yet.  Continue current dose of diltiazem.  ACE inhibitor was held secondary to acute kidney injury.  Patient on IV Lasix.  Increase hydralazine to 75 mg 4 times daily.  Outpatient follow-up.  Follow.  5.  Hypothyroidism TSH is 0.298 on 09/09/2017.  Repeat TSH is 4.4-2 on 03/08/2018.  Continue current dose of Synthroid with outpatient follow-up.  6.  Providencia rettgeri UTI Status post course of IV Rocephin.  No further antibiotics needed.   7.  Obesity  8.  Chronic  venous stasis changes Stable.  9.  Dyspnea secondary to volume overload Likely secondary to volume overload as patient given IV fluids due to concerns for acute renal failure on admission and hypovolemia.  Patient also noted to undergo a angiogram which did have contrast and it may be contributing to slightly worsening renal function.  Patient also transfuse 2 units packed red blood cells.  Chest x-ray which was done was consistent with volume overload.  Patient placed on IV Lasix with a urine output of 3.8 L over the past 24 hours.  Patient still with bibasilar crackles noted on examination.  Lasix 40 mg IV every 12 hours x2 doses.  Follow.  10.  Constipation Continue current bowel regimen of MiraLAX twice daily and Senokot-S twice daily.        DVT prophylaxis: Heparin Code Status: Full Family Communication: Updated patient.  No family at bedside. Disposition Plan: Back to ALF with home health once hemoglobin is stable, shortness of breath has improved hopefully in the next 24-48 hours.    Consultants:   Gastroenterology: Dr.Buccini 02/24/2018  Vascular surgery: Dr. Trula Slade 02/24/2018  Wound care Domenic Moras, RN 02/26/2018  Procedures:  Plain films of the left foot 02/23/2018  Renal ultrasound 02/25/2018  ABIs 02/24/2018--- unsuccessful due to patient intolerance.    1.  Ultrasound-guided access, right femoral artery               2.  Abdominal aortogram with CO2               3.  Left lower extremity runoff               4.  Atherectomy with drug-coated balloon angioplasty, left common femoral artery               5.  Atherectomy with stenting, left superficial femoral artery               6.  Mechanical thrombectomy, left superficial femoral artery               7.  Conscious sedation (114 minutes)----03/03/2018 per Dr. Trula Slade Transfuse 2 units packed red blood cells 03/07/2018 Antimicrobials:  IV Rocephin 02/24/2018>>>>>>> 03/05/2018  IV clindamycin 02/23/2018>>>>>  03/04/2018   Subjective: Patient states shortness of breath improved since yesterday and feels better.  Denies any chest pain.  Denies any bleeding.  Objective: Vitals:   03/07/18 1955 03/08/18 0535 03/08/18 0634 03/08/18 1037  BP: (!) 162/56 (!) 169/63  (!) 180/64  Pulse: 88 90 80   Resp:      Temp: 98.2 F (36.8 C) 98.5 F (36.9 C)    TempSrc: Oral Oral    SpO2: 96% 98% 97%   Weight:   88.4 kg (194 lb 14.2 oz)   Height:        Intake/Output Summary (Last 24 hours) at 03/08/2018 1057 Last data filed at 03/08/2018 0109 Gross per 24 hour  Intake 640 ml  Output 3200 ml  Net -2560 ml   Filed Weights   02/24/18 0600 03/02/18 2103 03/08/18 0634  Weight: 81.9 kg (180 lb 8.9 oz) 89.9 kg (198 lb 1.6 oz) 88.4 kg (194 lb 14.2 oz)    Examination:  General exam: Tearful. Respiratory system: Bibasilar crackles.  No rhonchi.  No wheezing.  Cardiovascular system: RRR with 3/6 SEM. No rubs, gallops or clicks.  Trace to 1+ left lower extremity edema.  Right stump with some swelling.  Gastrointestinal system: Abdomen is nontender, nondistended, soft, positive bowel sounds.  No rebound.  No guarding.  Central nervous system: Alert and oriented x3. No focal neurological deficits. Extremities: Status post right BKA.  Left lower extremity with bandage around the foot.  Skin: No rashes, lesions or ulcers Psychiatry: Judgement and insight appear normal. Mood & affect appropriate.     Data Reviewed: I have personally reviewed following labs and imaging studies  CBC: Recent Labs  Lab 03/03/18 0919  03/04/18 0015 03/05/18 0233 03/05/18 1604 03/06/18 0157 03/07/18 0249 03/07/18 1557 03/08/18 0210  WBC 6.1   < > 8.6 7.5  --  7.8  --  10.0 9.3  NEUTROABS 3.6  --  6.5  --   --   --   --  7.8*  --   HGB 8.0*   < > 8.2* 7.2* 7.5* 7.3* 6.9* 9.4* 9.2*  HCT 26.3*   < > 26.9* 23.8* 25.1* 24.2* 22.5* 30.2* 28.7*  MCV 84.8   < > 86.5 85.0  --  88.0  --  84.8 84.4  PLT 309   < > 332 275  --  304   --  305 322   < > = values in this interval not displayed.   Basic Metabolic Panel: Recent Labs  Lab 03/04/18 0015 03/05/18 0233 03/06/18 0157 03/07/18 0249 03/08/18 0210  NA 135 135 134* 134* 135  K 4.1 4.1 4.6 4.4 3.7  CL 102 104 105 104 99*  CO2 24 22 21* 22 24  GLUCOSE 129* 87 122* 102* 80  BUN 12 11 15 20  23*  CREATININE 1.14* 1.32* 1.45* 1.27* 1.17*  CALCIUM 9.5 8.8* 9.1 9.0 9.5  MG 1.8 2.1  --   --   --    GFR: Estimated Creatinine Clearance: 40.4 mL/min (A) (by C-G formula based on SCr of 1.17 mg/dL (H)). Liver Function Tests: No results for input(s): AST, ALT, ALKPHOS, BILITOT, PROT, ALBUMIN in the last 168 hours. No results for input(s): LIPASE, AMYLASE in the last 168 hours. No results for input(s): AMMONIA in the last 168 hours. Coagulation Profile: No results for input(s): INR, PROTIME in the last 168 hours. Cardiac Enzymes: No results for input(s): CKTOTAL, CKMB, CKMBINDEX, TROPONINI in the last 168 hours. BNP (last 3 results) No results for input(s): PROBNP in the last 8760 hours. HbA1C: No results for input(s): HGBA1C in the last 72 hours. CBG: No results for input(s): GLUCAP in the last 168 hours. Lipid Profile: No results for input(s): CHOL, HDL, LDLCALC, TRIG, CHOLHDL, LDLDIRECT in the last 72 hours. Thyroid Function Tests: Recent Labs    03/08/18 0210  TSH 4.422   Anemia Panel: No results for input(s): VITAMINB12, FOLATE, FERRITIN, TIBC, IRON, RETICCTPCT in the last 72 hours. Sepsis Labs: No results for input(s): PROCALCITON, LATICACIDVEN in the last 168 hours.  No results found for this or any previous visit (from the past 240 hour(s)).       Radiology Studies: Dg Chest Port 1 View  Result Date: 03/07/2018 CLINICAL DATA:  Shortness of breath and chest pain for 1 day. EXAM: PORTABLE CHEST 1 VIEW COMPARISON:  Chest radiograph August 29, 2017 FINDINGS: Cardiac silhouette is normal in size. Calcified aortic knob. Worsening interstitial  prominence. Bandlike density LEFT lower lung zone. Kerley B-lines. No pleural effusion or  focal consolidation. No pneumothorax. Osteopenia and dextroscoliosis. IMPRESSION: Worsening interstitial prominence seen with pulmonary edema and/or atypical infection. LEFT lung base atelectasis/scarring. Aortic Atherosclerosis (ICD10-I70.0). Electronically Signed   By: Elon Alas M.D.   On: 03/07/2018 15:06        Scheduled Meds: . amitriptyline  50 mg Oral QHS  . aspirin EC  81 mg Oral Daily  . atorvastatin  10 mg Oral q1800  . calcium-vitamin D  1 tablet Oral Q breakfast  . clopidogrel  75 mg Oral Q breakfast  . diltiazem  120 mg Oral Q breakfast  . docusate sodium  100 mg Oral Daily  . escitalopram  10 mg Oral Daily  . famotidine  20 mg Oral Q1500  . feeding supplement (ENSURE ENLIVE)  237 mL Oral TID WC  . fentaNYL  25 mcg Transdermal Q72H  . ferrous sulfate  325 mg Oral BID WC  . heparin  5,000 Units Subcutaneous Q8H  . hydrALAZINE  50 mg Oral QID  . hydrocortisone cream   Topical BID  . levothyroxine  175 mcg Oral QAC breakfast  . loratadine  10 mg Oral Daily  . multivitamin with minerals  1 tablet Oral Daily  . oxyCODONE  5 mg Oral Q4H  . pantoprazole  40 mg Oral Daily  . polyethylene glycol  17 g Oral BID  . senna-docusate  2 tablet Oral BID  . sodium chloride flush  3 mL Intravenous Q12H  . sorbitol  30 mL Oral Once  . vitamin C  500 mg Oral Daily   Continuous Infusions: . sodium chloride       LOS: 13 days    Time spent: 35 minutes    Irine Seal, MD [facility] Pager [phone]  If 7PM-7AM, please contact night-coverage [email or web address] Password [facility] 03/08/2018, 10:57 AM

## 2018-03-09 ENCOUNTER — Encounter (HOSPITAL_COMMUNITY)
Admission: EM | Disposition: A | Discharge status: Home Health | Payer: Self-pay | Source: Home / Self Care | Attending: Internal Medicine

## 2018-03-09 ENCOUNTER — Inpatient Hospital Stay (HOSPITAL_COMMUNITY): Payer: Medicare Other

## 2018-03-09 ENCOUNTER — Encounter (HOSPITAL_COMMUNITY): Payer: Self-pay | Admitting: Gastroenterology

## 2018-03-09 ENCOUNTER — Telehealth: Payer: Self-pay | Admitting: Surgery

## 2018-03-09 DIAGNOSIS — K921 Melena: Secondary | ICD-10-CM | POA: Clinically undetermined

## 2018-03-09 DIAGNOSIS — E8770 Fluid overload, unspecified: Secondary | ICD-10-CM

## 2018-03-09 DIAGNOSIS — N183 Chronic kidney disease, stage 3 (moderate): Secondary | ICD-10-CM

## 2018-03-09 DIAGNOSIS — D649 Anemia, unspecified: Secondary | ICD-10-CM | POA: Diagnosis present

## 2018-03-09 DIAGNOSIS — R195 Other fecal abnormalities: Secondary | ICD-10-CM

## 2018-03-09 DIAGNOSIS — R578 Other shock: Secondary | ICD-10-CM

## 2018-03-09 HISTORY — PX: ESOPHAGOGASTRODUODENOSCOPY: SHX5428

## 2018-03-09 LAB — BASIC METABOLIC PANEL
Anion gap: 9 (ref 5–15)
BUN: 26 mg/dL — ABNORMAL HIGH (ref 6–20)
CHLORIDE: 97 mmol/L — AB (ref 101–111)
CO2: 27 mmol/L (ref 22–32)
CREATININE: 1.42 mg/dL — AB (ref 0.44–1.00)
Calcium: 9.3 mg/dL (ref 8.9–10.3)
GFR calc Af Amer: 39 mL/min — ABNORMAL LOW (ref >60)
GFR calc non Af Amer: 34 mL/min — ABNORMAL LOW (ref >60)
Glucose, Bld: 82 mg/dL (ref 65–99)
Potassium: 3.4 mmol/L — ABNORMAL LOW (ref 3.5–5.1)
SODIUM: 133 mmol/L — AB (ref 135–145)

## 2018-03-09 LAB — CBC
HEMATOCRIT: 26.2 % — AB (ref 36.0–46.0)
HEMOGLOBIN: 8.5 g/dL — AB (ref 12.0–15.0)
MCH: 27.4 pg (ref 26.0–34.0)
MCHC: 32.4 g/dL (ref 30.0–36.0)
MCV: 84.5 fL (ref 78.0–100.0)
Platelets: 321 10*3/uL (ref 150–400)
RBC: 3.1 MIL/uL — ABNORMAL LOW (ref 3.87–5.11)
RDW: 16.4 % — ABNORMAL HIGH (ref 11.5–15.5)
WBC: 7.7 10*3/uL (ref 4.0–10.5)

## 2018-03-09 LAB — PREPARE RBC (CROSSMATCH)

## 2018-03-09 LAB — HEMOGLOBIN AND HEMATOCRIT, BLOOD
HEMATOCRIT: 23.5 % — AB (ref 36.0–46.0)
HEMOGLOBIN: 7.5 g/dL — AB (ref 12.0–15.0)

## 2018-03-09 LAB — GLUCOSE, CAPILLARY: Glucose-Capillary: 340 mg/dL — ABNORMAL HIGH (ref 65–99)

## 2018-03-09 SURGERY — EGD (ESOPHAGOGASTRODUODENOSCOPY)
Anesthesia: Moderate Sedation | Laterality: Left

## 2018-03-09 MED ORDER — SODIUM CHLORIDE 0.9 % IV SOLN: INTRAVENOUS | Status: DC

## 2018-03-09 MED ORDER — MIDAZOLAM HCL 5 MG/5ML IJ SOLN
INTRAMUSCULAR | Status: DC | PRN
  Administered 2018-03-09: 1 mg via INTRAVENOUS
  Administered 2018-03-09: 2 mg via INTRAVENOUS

## 2018-03-09 MED ORDER — FENTANYL CITRATE (PF) 100 MCG/2ML IJ SOLN
INTRAMUSCULAR | Status: AC
  Filled 2018-03-09: qty 4

## 2018-03-09 MED ORDER — ACETAMINOPHEN 325 MG PO TABS
650.0000 mg | ORAL_TABLET | Freq: Once | ORAL | Status: AC
  Administered 2018-03-09: 650 mg via ORAL
  Filled 2018-03-09: qty 2

## 2018-03-09 MED ORDER — SODIUM CHLORIDE 0.9 % IV SOLN
8.0000 mg/h | INTRAVENOUS | Status: DC
  Administered 2018-03-09: 8 mg/h via INTRAVENOUS
  Filled 2018-03-09 (×2): qty 80

## 2018-03-09 MED ORDER — FUROSEMIDE 10 MG/ML IJ SOLN
20.0000 mg | Freq: Once | INTRAMUSCULAR | Status: DC
  Filled 2018-03-09: qty 2

## 2018-03-09 MED ORDER — POTASSIUM CHLORIDE CRYS ER 20 MEQ PO TBCR
40.0000 meq | EXTENDED_RELEASE_TABLET | Freq: Once | ORAL | Status: AC
  Administered 2018-03-09: 40 meq via ORAL
  Filled 2018-03-09: qty 2

## 2018-03-09 MED ORDER — PANTOPRAZOLE SODIUM 40 MG IV SOLR: 40.0000 mg | Freq: Two times a day (BID) | INTRAVENOUS | Status: DC

## 2018-03-09 MED ORDER — SODIUM CHLORIDE 0.9 % IV SOLN: Freq: Once | INTRAVENOUS | Status: AC

## 2018-03-09 MED ORDER — FENTANYL CITRATE (PF) 100 MCG/2ML IJ SOLN
INTRAMUSCULAR | Status: AC
  Administered 2018-03-09: 50 ug
  Filled 2018-03-09: qty 2

## 2018-03-09 MED ORDER — PANTOPRAZOLE SODIUM 40 MG PO TBEC
40.0000 mg | DELAYED_RELEASE_TABLET | Freq: Two times a day (BID) | ORAL | Status: DC
  Administered 2018-03-09: 40 mg via ORAL
  Filled 2018-03-09: qty 1

## 2018-03-09 MED ORDER — SODIUM CHLORIDE 0.9 % IV SOLN: 8.0000 mg/h | INTRAVENOUS | Status: DC

## 2018-03-09 MED ORDER — SODIUM CHLORIDE 0.9 % IV SOLN: Freq: Once | INTRAVENOUS | Status: DC

## 2018-03-09 MED ORDER — PANTOPRAZOLE SODIUM 40 MG IV SOLR
40.0000 mg | Freq: Once | INTRAVENOUS | Status: DC
  Filled 2018-03-09: qty 40

## 2018-03-09 MED ORDER — MIDAZOLAM HCL 5 MG/ML IJ SOLN
INTRAMUSCULAR | Status: AC
  Filled 2018-03-09: qty 3

## 2018-03-09 MED ORDER — NOREPINEPHRINE BITARTRATE 1 MG/ML IV SOLN
0.0000 ug/min | INTRAVENOUS | Status: DC
  Administered 2018-03-09: 2 ug/min via INTRAVENOUS
  Administered 2018-03-09: 5 ug/min via INTRAVENOUS
  Filled 2018-03-09 (×2): qty 4

## 2018-03-09 MED ORDER — DIPHENHYDRAMINE HCL 25 MG PO CAPS
25.0000 mg | ORAL_CAPSULE | Freq: Once | ORAL | Status: AC
  Administered 2018-03-09: 25 mg via ORAL
  Filled 2018-03-09: qty 1

## 2018-03-09 MED ORDER — SODIUM CHLORIDE 0.9 % IV SOLN: 80.0000 mg | Freq: Once | INTRAVENOUS | Status: DC

## 2018-03-09 MED ORDER — SODIUM CHLORIDE 0.9 % IV SOLN
INTRAVENOUS | Status: DC
  Administered 2018-03-10 – 2018-03-13 (×4): via INTRAVENOUS

## 2018-03-09 MED ORDER — PANTOPRAZOLE SODIUM 40 MG IV SOLR
40.0000 mg | Freq: Two times a day (BID) | INTRAVENOUS | Status: DC
  Filled 2018-03-09 (×2): qty 40

## 2018-03-09 MED ORDER — SODIUM CHLORIDE 0.9 % IV BOLUS
1000.0000 mL | Freq: Once | INTRAVENOUS | Status: AC
  Administered 2018-03-09: 1000 mL via INTRAVENOUS

## 2018-03-09 MED ORDER — PEG 3350-KCL-NA BICARB-NACL 420 G PO SOLR
4000.0000 mL | Freq: Once | ORAL | Status: AC
  Administered 2018-03-09: 4000 mL via ORAL
  Filled 2018-03-09: qty 4000

## 2018-03-09 MED ORDER — DEXTROSE 5 % IV SOLN
0.0000 ug/min | INTRAVENOUS | Status: DC
  Administered 2018-03-09: 40 ug/min via INTRAVENOUS
  Administered 2018-03-10: 20 ug/min via INTRAVENOUS
  Filled 2018-03-09 (×3): qty 16

## 2018-03-09 MED FILL — Medication: Qty: 1 | Status: AC

## 2018-03-09 NOTE — Progress Notes (Addendum)
Dr. Grandville Silos notified of pts BP. Per MD order hold lasix and transfuse second unit.

## 2018-03-09 NOTE — Progress Notes (Signed)
Dr. Grandville Silos notified of patients BP. New orders received.

## 2018-03-09 NOTE — Telephone Encounter (Signed)
[facility] appt already sched for 04/06/18 at 2:00. Sched ABI 04/03/18 at 4:00. Called [facility]#, go pt's husband, he told me pt was in the hosp, told him that I was just calling to give them the time and date of the pt's appts. Pt's husband would not take the information.  The pt will get appts when discharged from hosp and on mychart.

## 2018-03-09 NOTE — Progress Notes (Signed)
:   Per nursing that patient with systolic blood pressures in the 60s and 70s despite receiving a unit of packed red blood cells.  Patient noted to have multiple melanotic stools since this morning. Went to bed size and assessed patient Physical exam General: Pallor Respirations: Lungs clear to auscultation bilaterally anterior lung fields. Cardiovascular: Regular rate and rhythm no murmurs rubs or gallops. Abdomen: Soft, nontender, nondistended, positive bowel sounds Extremities: No clubbing no cyanosis.  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  Left lower extremity bandaged with chronic venous stasis changes noted  Assessment/plan #1 hemorrhagic shock Patient with ongoing melanotic stools since this morning.  H&H obtained was 7.5.  Patient was typed and crossed and currently receiving second unit of packed red blood cells.  Patient noted to recently be in volume overload.  Due to systolic blood pressures maintaining in the 60s a liter bolus of normal saline ordered.  Patient started on levophed.  Second unit of packed red blood cells being transfused.  H&H 2 hours posttransfusion.  Change Protonix 40 mg IV every 12 to Protonix drip.  Pharmacy to dose Protonix drip.  [facility] was consulted earlier this morning and updated on patient's current status.  Spoke with Dr. Watt Climes. Patient transferred to the ICU.  Consult with critical care.  RN to update [facility] once patient is situated in the ICU for possible upper endoscopy at bedside and further evaluation and management.  Discontinue aspirin and Plavix.  Critical care consultation pending.  Gastroenterology consulted. Patient transported to the ICU.   Time spent 45 minutes

## 2018-03-09 NOTE — Progress Notes (Signed)
Patient noted to have a large melanotic stool this morning.  RN called that patient had another large melanotic stool and has now changed her mind and wants further evaluation by gastroenterology for her ongoing probable upper GI bleed.  Repeat H&H done was 7.5.  Will transfer patient to stepdown unit.  Transfuse 2 units packed red blood cells.  Follow H&H.  [place] [facility] has been reconsulted to reassess the patient.  We will keep patient n.p.o. for now.  No charge.

## 2018-03-09 NOTE — Progress Notes (Signed)
Dr. Grandville Silos made aware of another large bloody stool and that patient has decided she wants a GI consult now.

## 2018-03-09 NOTE — Progress Notes (Addendum)
Faythe Casa, BS, RN with [facility] was in visiting patient and came to me in the hall and stated that the patient was anxious and having trouble breathing. Pt assessed and quickly calmed down once I entered the room. V/S stable. BP 126/74. O2 was 97% on room air. Patient coughed up sputum and states she "feels better". Dr. Grandville Silos made aware.  Emelda Fear, RN

## 2018-03-09 NOTE — Progress Notes (Signed)
EGD was performed emergently at bedside for ongoing melanotic stool and hypotension. Patient was given 3 mg of IV Versed( she had received 50 g of fentanyl prior to EGD for a central line placement).  Findings: Normal-appearing esophagus. Regular Z line. Pedunculated polyp in gastric cardia, nonbleeding, not removed( patient had received aspirin and Plavix today morning), appeared hyperplastic. There was no evidence of recent or active bleeding noted in the entire gastric cavity. Retroflexion was otherwise unremarkable. Antrum and pyloric channel appeared unremarkable. Additional bulb and rest of the duodenum appeared unremarkable.  Recommendations: Clear liquid diet. Bowel prep for colonoscopy in a.m.. Okay to DC IV Protonix drip and continue Protonix IV daily meanwhile. Patient to receive 2 more units PRBC transfusion, monitor H&H and transfuse if hemoglobin less than 7.  Ronnette Juniper, MD

## 2018-03-09 NOTE — Progress Notes (Signed)
Tried to call report to nurse at this time and she was unavailable. Ask that this nurse call back.

## 2018-03-09 NOTE — Procedures (Signed)
Arterial Catheter Insertion Procedure Note Shelby Reese [id number] 1935/09/19  Procedure: Insertion of Arterial Catheter  Indications: Blood pressure monitoring and Frequent blood sampling  Procedure Details Consent: Risks of procedure as well as the alternatives and risks of each were explained to the (patient/caregiver).  Consent for procedure obtained. Time Out: Verified patient identification, verified procedure, site/side was marked, verified correct patient position, special equipment/implants available, medications/allergies/relevent history reviewed, required imaging and test results available.  Performed  Maximum sterile technique was used including antiseptics, cap, gloves, gown, hand hygiene, mask and sheet. Skin prep: Chlorhexidine; local anesthetic administered 20 gauge catheter was inserted into left radial artery using the Seldinger technique. ULTRASOUND GUIDANCE USED: NO Evaluation Blood flow good; BP tracing good. Complications: No apparent complications.  Normal saline flush used.  Site dressed per RT protocol.  Line leveled and zeroed.     [name] 03/09/2018

## 2018-03-09 NOTE — Telephone Encounter (Signed)
-----   Message from Mena Goes, RN sent at 03/05/2018  4:44 PM EDT ----- Regarding: 1 month w/ labs   ----- Message ----- From: Serafina Mitchell, MD Sent: 03/05/2018   7:54 AM To: [facility] Charge Pool  03-05-2018:  follo w up hospital visit  F/u with me in 1 month with abi and duplex 1 month

## 2018-03-09 NOTE — Progress Notes (Signed)
Dr. Grandville Silos and rapid response at bedside to assess patient and transfer patient to ICU.

## 2018-03-09 NOTE — Progress Notes (Signed)
Patient transferred to ICU at this time accompanied by Magda Paganini, RRT and Dr. Grandville Silos.

## 2018-03-09 NOTE — Progress Notes (Signed)
50 mcg fentanyl wasted in sink with Edilia Bo, RN.  Joellen Jersey, RN

## 2018-03-09 NOTE — Consult Note (Signed)
PULMONARY / CRITICAL CARE MEDICINE   Name: Shelby Reese MRN: [id number] DOB: 04/01/1935    ADMISSION DATE:  02/23/2018 CONSULTATION DATE:  03/09/2018  REFERRING MD:  Cline Cools CHIEF COMPLAINT:  Melena and hypotension.  HISTORY OF PRESENT ILLNESS:        This is an 83 year old with severe peripheral vascular disease who was initially admitted on 4/8 due to a nonhealing ulcer of the left foot.  She eventually underwent CO2 angiography with subsequent CFA arthrectomy and stent placement on 4/16.  She was placed on dual antiplatelet therapy at that time.  She was already noted to be anemic and guaiac positive.  I was asked to see the patient this afternoon due to the development of melena and hypotension.  She is received only a fraction of a liter of normal saline and packed red blood cells and transferred to the intensive care unit and is now normotensive and in part entirely appropriate and interactive.  She has no complaint of abdominal pain.  She denies a history of prior known significant GI bleeding.  She denies a history of liver disease.  She has not had a drink in many years.  She denies use of nonsteroidals at home.  Her dual antiplatelet therapy has been discontinued and she has been placed on a Protonix infusion.  PAST MEDICAL HISTORY :  She  has a past medical history of Allergy, Anemia, CKD (chronic kidney disease), stage II, Fibromyalgia, GERD (gastroesophageal reflux disease), Hiatal hernia, Hypertension, Hypothyroid, Lymphedema, Osteoarthritis, Peripheral vascular disease ([place]), Ulcer of knee ([place]), Urinary incontinence, Varicose veins, and Venous insufficiency.  PAST SURGICAL HISTORY: She  has a past surgical history that includes Tonsillectomy; Knee arthroscopy (Left); Colonoscopy; Cataract extraction w/ intraocular lens  implant, bilateral; Multiple tooth extractions; I&D extremity (Right, 07/18/2017); Application if wound vac (Right, 07/18/2017); I&D extremity (Right, 07/23/2017);  Skin split graft (Right, 07/25/2017); I&D extremity (Right, 11/28/2017); I&D extremity (Right, 12/03/2017); Skin split graft (Right, 12/05/2017); Amputation (Right, 12/15/2017); Lower Extremity Angiography (N/A, 03/03/2018); PERIPHERAL VASCULAR ATHERECTOMY (Left, 03/03/2018); and PERIPHERAL VASCULAR INTERVENTION (03/03/2018).  Allergies  Allergen Reactions  . Cinnamon Hives  . Ciprofloxacin Other (See Comments)    TREMORS  . Diovan [Valsartan] Other (See Comments)    Extreme vertigo  . Food Diarrhea and Other (See Comments)    ORANGE JUICE   UPSET STOMACH  . Latex Rash and Other (See Comments)    Rash/inflammation due to exposure  . Nitrofuran Derivatives Hives and Rash    "Full body rash"  . Penicillins Hives and Swelling    *tolerated Ceftriaxone September 2018 Has patient had a PCN reaction causing immediate rash, facial/tongue/throat swelling, SOB or lightheadedness with hypotension:No--severe irritation at the injection site Has patient had a PCN reaction causing severe rash involving mucus membranes or skin necrosis:Unknown Has patient had a PCN reaction that required hospitalization:No Has patient had a PCN reaction occurring within the last 10 years:Yes If all of the above answers are "NO", then may proceed with  . Bactrim [Sulfamethoxazole-Trimethoprim] Diarrhea and Nausea Only  . Other Rash    Mycins, Strawberry, Oranges  . Sulfa Antibiotics Diarrhea and Nausea Only    No current facility-administered medications on file prior to encounter.    Current Outpatient Medications on File Prior to Encounter  Medication Sig  . amitriptyline (ELAVIL) 50 MG tablet Take 50 mg by mouth at bedtime.  Marland Kitchen aspirin EC 81 MG tablet Take 81 mg by mouth daily with breakfast.  . benazepril (LOTENSIN) 40 MG  tablet Take 40 mg by mouth daily.  . Calcium Carbonate-Vitamin D (CALCIUM 600+D) 600-400 MG-UNIT per tablet Take 1 tablet by mouth daily at 3 pm.   . cetirizine (ZYRTEC) 10 MG tablet Take 10 mg by  mouth at bedtime.   Marland Kitchen diltiazem (TIAZAC) 120 MG 24 hr capsule Take 120 mg by mouth daily with breakfast.   . docusate sodium (COLACE) 100 MG capsule Take 1 capsule (100 mg total) by mouth daily.  Marland Kitchen doxycycline (VIBRA-TABS) 100 MG tablet Take 100 mg by mouth 2 (two) times daily. For 10 days  . escitalopram (LEXAPRO) 10 MG tablet Take 10 mg by mouth daily.  . famotidine (PEPCID) 40 MG tablet Take 40 mg by mouth daily at 3 pm. 1600  . feeding supplement, ENSURE ENLIVE, (ENSURE ENLIVE) LIQD Take 237 mLs by mouth 3 (three) times daily with meals.  . fentaNYL (DURAGESIC - DOSED MCG/HR) 25 MCG/HR patch Place 25 mcg onto the skin every 3 (three) days.  . ferrous sulfate 325 (65 FE) MG tablet Take 325 mg by mouth 2 (two) times daily with a meal.  . furosemide (LASIX) 20 MG tablet Take 20 mg by mouth daily.   . hydrALAZINE (APRESOLINE) 50 MG tablet Take 50 mg by mouth 4 (four) times daily.  . methocarbamol (ROBAXIN) 500 MG tablet Take 500 mg by mouth every 6 (six) hours as needed for muscle spasms.  . Multiple Vitamins-Minerals (MULTIVITAMIN WITH MINERALS) tablet Take 1 tablet by mouth daily.  Marland Kitchen oxyCODONE (OXY IR/ROXICODONE) 5 MG immediate release tablet Take 5 mg by mouth every 4 (four) hours. Every 30 mins before therapy   . pantoprazole (PROTONIX) 40 MG tablet Take 1 tablet (40 mg total) by mouth daily.  . polyethylene glycol (MIRALAX / GLYCOLAX) packet Take 17 g by mouth 2 (two) times daily.  Marland Kitchen senna-docusate (SENNA-PLUS) 8.6-50 MG tablet Take 2 tablets by mouth 2 (two) times daily.  Marland Kitchen SYNTHROID 175 MCG tablet Take 175 mcg by mouth daily before breakfast.   . vitamin C (ASCORBIC ACID) 500 MG tablet Take 500 mg by mouth daily.    FAMILY HISTORY:  Her indicated that her mother is deceased. She indicated that her father is deceased. She indicated that only one of her two sisters is alive. She indicated that the status of her maternal grandmother is unknown.   SOCIAL HISTORY: She  reports that she  quit smoking about 38 years ago. She has a 5.00 pack-year smoking history. She has never used smokeless tobacco. She reports that she drinks about 8.4 oz of alcohol per week. She reports that she does not use drugs.  REVIEW OF SYSTEMS:   As above  SUBJECTIVE:  As above  VITAL SIGNS: BP (!) 68/44   Pulse 90   Temp 98.4 F (36.9 C) (Oral)   Resp (!) 27   Ht 5\' 3"  (1.6 m)   Wt 190 lb 7.6 oz (86.4 kg)   SpO2 100%   BMI 33.74 kg/m   HEMODYNAMICS:    VENTILATOR SETTINGS:    INTAKE / OUTPUT: I/O last 3 completed shifts: In: 360 [P.O.:360] Out: 4300 [Urine:4300]  PHYSICAL EXAMINATION: General: Chronically ill-appearing obese elderly female Neuro: She is entirely alert and appropriately interactive Cardiovascular: S1 and S2 are regular without murmur rub or gallop Lungs: Respirations are unlabored, the lungs are clear to supine exam there is good air movement throughout and no wheezes Abdomen: The abdomen is obese and soft without any organomegaly masses or tenderness.  She is anicteric.  A few bowel sounds are present.  She is passing black liquid stool  Musculoskeletal: She is status post right AKA.  She has a normal capillary refill on the left foot.   LABS:  BMET Recent Labs  Lab 03/07/18 0249 03/08/18 0210 03/09/18 0305  NA 134* 135 133*  K 4.4 3.7 3.4*  CL 104 99* 97*  CO2 22 24 27   BUN 20 23* 26*  CREATININE 1.27* 1.17* 1.42*  GLUCOSE 102* 80 82    Electrolytes Recent Labs  Lab 03/04/18 0015 03/05/18 0233  03/07/18 0249 03/08/18 0210 03/09/18 0305  CALCIUM 9.5 8.8*   < > 9.0 9.5 9.3  MG 1.8 2.1  --   --   --   --    < > = values in this interval not displayed.    CBC Recent Labs  Lab 03/07/18 1557 03/08/18 0210 03/09/18 0305 03/09/18 1037  WBC 10.0 9.3 7.7  --   HGB 9.4* 9.2* 8.5* 7.5*  HCT 30.2* 28.7* 26.2* 23.5*  PLT 305 322 321  --     Coag's No results for input(s): APTT, INR in the last 168 hours.  Sepsis Markers No results for  input(s): LATICACIDVEN, PROCALCITON, O2SATVEN in the last 168 hours.  ABG No results for input(s): PHART, PCO2ART, PO2ART in the last 168 hours.  Liver Enzymes No results for input(s): AST, ALT, ALKPHOS, BILITOT, ALBUMIN in the last 168 hours.  Cardiac Enzymes No results for input(s): TROPONINI, PROBNP in the last 168 hours.  Glucose No results for input(s): GLUCAP in the last 168 hours.  Imaging No results found.     DISCUSSION:      GI bleeding in a patient with severe peripheral vascular disease.  I would encourage aggressive volume repletion as the primary treatment for her hypotension avoiding alpha agents if possible especially as ischemia has to be a consideration in a patient such as this.  I agree with a PPI infusion as ordered, gastroenterology has been consulted.  We will monitor serial hemoglobins urine output and vital signs as indicators of the adequacy of resuscitation.  Lars Masson, MD Critical Care Medicine [facility] Pager: [phone]  03/09/2018, 5:06 PM

## 2018-03-09 NOTE — Procedures (Signed)
Central venous catheter.  Indication: Inadequate peripheral access in a hypotensive patient with GI bleeding  Procedure: After obtaining informed consent and performing a timeout a central venous catheter was placed.  The area over the left subclavian was extensively prepped with chlorhexidine and then widely draped.  Sterile garb was donned and the vein easily cannulated using landmarks for guidance.  A wire was gently passed through the needle and the tract dilated.  A 7 French triple-lumen catheter was placed to 19 cm.  There was good flow from all ports.  The catheter was sutured in place.  Chest x-ray shows a well-placed catheter without pneumothorax

## 2018-03-09 NOTE — Progress Notes (Signed)
Physical Therapy Treatment Patient Details Name: Shelby Reese MRN: [id number] DOB: 1934-11-29 Today's Date: 03/09/2018    History of Present Illness 83 year old with past medical history relevant for peripheral arterial disease status post right AKA on 12/15/2017, bilateral severe chronic venous stasis, hypertension, iron deficiency anemia, hypothyroidism admitted with acute on chronic anemia with guaiac positive stools and acute kidney injury in the setting of worsening left lower extremity ulcer and infection.    PT Comments    Session limited to bed mobility secondary to patient passing large, black, bloody stool. MD and RN aware. Requiring min assist for rolling to the right with use of bed rail for trunk activation. Will continue to follow to see if patient is appropriate for slideboard transfers.    Follow Up Recommendations  SNF     Equipment Recommendations  None recommended by PT    Recommendations for Other Services       Precautions / Restrictions Precautions Precautions: Fall Restrictions Weight Bearing Restrictions: No Other Position/Activity Restrictions: RLE BKA    Mobility  Bed Mobility Overal bed mobility: Needs Assistance Bed Mobility: Rolling Rolling: Min assist   Supine to sit: HOB elevated;Mod assist     General bed mobility comments: Rolling to right x 2 with min assist and use of bed rail to promote trunk activation in order to place/remove bed pan and perform peri care  Transfers                 General transfer comment: Not able to attempt  Ambulation/Gait                 Stairs             Wheelchair Mobility    Modified Rankin (Stroke Patients Only)       Balance Overall balance assessment: Needs assistance Sitting-balance support: Feet unsupported;Bilateral upper extremity supported Sitting balance-Leahy Scale: Good                                      Cognition Arousal/Alertness:  Awake/alert Behavior During Therapy: WFL for tasks assessed/performed Overall Cognitive Status: No family/caregiver present to determine baseline cognitive functioning                                 General Comments: Patient alert and oriented to self, situation, and place, however not oriented to year. Has confusion, thought the fall bag was part of her personal belongings and was asking PT to look through it for her glasses      Exercises Total Joint Exercises Long Arc Quad: AROM;10 reps;Left;Seated Other Exercises Other Exercises: supine glute sets x 5     General Comments        Pertinent Vitals/Pain Pain Assessment: Faces Faces Pain Scale: Hurts little more Pain Location: L lower leg with palpation Pain Descriptors / Indicators: Sore Pain Intervention(s): Limited activity within patient's tolerance;Monitored during session    Home Living                      Prior Function            PT Goals (current goals can now be found in the care plan section) Acute Rehab PT Goals Patient Stated Goal: be able to transfer to [facility] PT Goal Formulation: With patient Time For Goal Achievement: 03/13/18 Potential  to Achieve Goals: Fair    Frequency    Min 3X/week      PT Plan Current plan remains appropriate    Co-evaluation              AM-PAC PT "6 Clicks" Daily Activity  Outcome Measure  Difficulty turning over in bed (including adjusting bedclothes, sheets and blankets)?: Unable Difficulty moving from lying on back to sitting on the side of the bed? : Unable Difficulty sitting down on and standing up from a chair with arms (e.g., wheelchair, bedside commode, etc,.)?: Unable Help needed moving to and from a bed to chair (including a wheelchair)?: Total Help needed walking in hospital room?: Total Help needed climbing 3-5 steps with a railing? : Total 6 Click Score: 6    End of Session   Activity Tolerance: Treatment limited secondary  to medical complications (Comment) Patient left: in bed;with call bell/phone within reach Nurse Communication: Other (comment)((see assessment)) PT Visit Diagnosis: Muscle weakness (generalized) (M62.81);Pain Pain - Right/Left: Left Pain - part of body: Ankle and joints of foot;Leg     Time: 9480-1655 PT Time Calculation (min) (ACUTE ONLY): 16 min  Charges:  $Therapeutic Activity: 8-22 mins                    G Codes:       Ellamae Sia, PT, DPT Acute Rehabilitation Services  Pager: [phone]    Willy Eddy 03/09/2018, 12:33 PM

## 2018-03-09 NOTE — Progress Notes (Signed)
PROGRESS NOTE    Shelby Reese  LKG:[id number] DOB: November 16, 1935 DOA: 02/23/2018 PCP: [name], MD    Brief Narrative:  83 year old female with history of peripheral arterial disease status post right AKA, bilateral severe chronic venous stasis, hypertension, iron deficiency anemia, hypothyroidism 02/23/2018 with acute and chronic anemia with guaiac positive stools and acute kidney injury in the setting of worsening left lower extremity ulcer and infection.  GI was consulted and patient refused any further GI workup.  Vascular surgery was consulted.  Vascular surgery recommended arteriogram after renal function stabilized.  Patient was transferred to [facility] on 03/02/2018.     Assessment & Plan:   Principal Problem:   Foot ulcer ([place]) Active Problems:   Peripheral vascular disease of lower extremity with ulceration (HCC)   Melena   Hypothyroidism, acquired   Iron deficiency anemia   Atherosclerosis of artery of right lower extremity (HCC)   Essential hypertension   CKD (chronic kidney disease), stage III (HCC)   Peripheral vascular disease (HCC)   Anemia   Arterioloscleroses   Acute lower UTI   AKI (acute kidney injury) ([place])   Fecal occult blood test positive   Hypervolemia  #1 left foot ulcer/peripheral vascular disease Patient with left foot ulcer concerning for necrotic ulcer.  ABIs were ordered however unable to be tolerated per patient due to pain.  MRI also could not be done.  Patient was seen by vascular surgery Dr. Trula Slade who had initially recommended amputation however patient wanted to exhaust all options prior to decision for amputation.  Patient subsequently underwent arteriogram with atherectomy with drug-coated balloon angioplasty, left common femoral artery, atherectomy with stenting left superficial femoral artery, mechanical thrombectomy, left superficial femoral artery per Dr. Trula Slade 03/03/2018.  Continue current wound care, aspirin, statin, Plavix.   Risk factor modification.  Discontinued IV clindamycin on 03/04/2018.  Discontinued IV Rocephin on 03/05/2018.  Patient status post approximately 10 days of antibiotics and no further antibiotics needed at this time.  Patient has been seen by vascular surgery and will need to follow-up with them in the outpatient setting.  Patient will need outpatient wound care.   2.  Chronic anemia/melanotic stools Patient seen in consultation by gastroenterology early on in the hospitalization however patient refused any invasive procedures including endoscopy.  GI signed off. She noted to have large melanotic stool this morning 03/09/2018.  Patient currently asymptomatic.  Patient still adamant on not undergoing any invasive procedures of endoscopy or colonoscopy.  Will check an H&H every 6 hours x2.  Increase Protonix to 40 mg twice daily.  Patient status post 2 units packed red blood cells 03/07/2018 with hemoglobin currently at 8.5.  Transfusion threshold hemoglobin less than 8.  Due to recent vascular procedure will continue Plavix for now unless significant drop in hemoglobin.  Follow H&H.   3.  Acute kidney injury Initially likely secondary to a prerenal azotemia in the setting of contrast which was used during angiogram.  Patient also noted to be on ACE inhibitor and Lasix on admission which were discontinued.  Patient status post angiogram which utilized contrast.  Patient was given a bolus of IV fluids and IV fluids increased to 100 cc/h.  Patient noted to be volume overloaded on 03/07/2018 and as such IV fluids have been saline locked.  Patient placed on IV Lasix with good urine output.  Renal function improved.  Slight bump in creatinine today and as such we will discontinue IV Lasix.  Patient shortness of breath has  resolved.  Continue to hold ACE inhibitor and would not resume at this time.  Resume home dose oral Lasix in the next 24-48 hours.   4.  Hypertension Blood pressure elevated the morning of 03/08/2018.   Blood pressure currently stable.  Continue current regimen of diltiazem, increased dose of hydralazine.  ACE inhibitor on hold secondary to presentation with acute kidney injury.  Patient received IV Lasix will resume home dose oral Lasix tomorrow 03/10/2018 if no further melanotic stools.  5.  Hypothyroidism TSH is 0.298 on 09/09/2017.  Repeat TSH is 4.422 on 03/08/2018.  Synthroid.  Outpatient follow-up.   6.  Providencia rettgeri UTI Status post course of IV Rocephin.  No further antibiotics needed.   7.  Obesity  8.  Chronic venous stasis changes Stable.  9.  Dyspnea secondary to volume overload Likely secondary to volume overload as patient given IV fluids due to concerns for acute renal failure on admission and hypovolemia.  Patient also noted to undergo a angiogram which did have contrast and it may be contributing to slightly worsening renal function.  Patient also transfused 2 units packed red blood cells.  Chest x-ray which was done was consistent with volume overload.  Patient placed on IV Lasix with good diuresis with a urine output of 2.6 L over the past 24 hours and a urine output of 3.8 L from March 07, 2018 to March 08, 2018.  Lung sounds are clear to auscultation bilaterally.  Will resume patient's home dose oral Lasix tomorrow 03/10/2018 if no further melanotic stools.  10.  Constipation Continue current bowel regimen of MiraLAX twice daily and Senokot-S twice daily.        DVT prophylaxis: Heparin Code Status: Full Family Communication: Updated patient.  No family at bedside. Disposition Plan: Back to ALF with home health once hemoglobin is stable, no further melanotic stools, shortness of breath has improved hopefully in the next 24-48 hours.    Consultants:   Gastroenterology: Dr.Buccini 02/24/2018  Vascular surgery: Dr. Trula Slade 02/24/2018  Wound care Domenic Moras, RN 02/26/2018  Procedures:  Plain films of the left foot 02/23/2018  Renal ultrasound  02/25/2018  ABIs 02/24/2018--- unsuccessful due to patient intolerance.    1.  Ultrasound-guided access, right femoral artery               2.  Abdominal aortogram with CO2               3.  Left lower extremity runoff               4.  Atherectomy with drug-coated balloon angioplasty, left common femoral artery               5.  Atherectomy with stenting, left superficial femoral artery               6.  Mechanical thrombectomy, left superficial femoral artery               7.  Conscious sedation (114 minutes)----03/03/2018 per Dr. Trula Slade Transfuse 2 units packed red blood cells 03/07/2018  Antimicrobials:  IV Rocephin 02/24/2018>>>>>>> 03/05/2018  IV clindamycin 02/23/2018>>>>> 03/04/2018   Subjective: Was notified by RN that patient had a bloody bowel movement.  Went to bedside and patient noted to have a large melanotic stool.  Patient states " O I think it is my hemorrhoids. " Patient states shortness of breath improved.  Denies any chest pain.  Patient still adamant in not undergoing a upper endoscopy or  colonoscopy.   Objective: Vitals:   03/08/18 2012 03/09/18 0400 03/09/18 0806 03/09/18 0807  BP: (!) 127/45 (!) 136/47 (!) 137/53   Pulse: 85     Resp:      Temp: 98.5 F (36.9 C) 98 F (36.7 C)  98 F (36.7 C)  TempSrc: Oral Oral  Oral  SpO2: 96% 95%    Weight:  86.4 kg (190 lb 7.6 oz)    Height:        Intake/Output Summary (Last 24 hours) at 03/09/2018 1044 Last data filed at 03/09/2018 0828 Gross per 24 hour  Intake 120 ml  Output 4000 ml  Net -3880 ml   Filed Weights   03/02/18 2103 03/08/18 0634 03/09/18 0400  Weight: 89.9 kg (198 lb 1.6 oz) 88.4 kg (194 lb 14.2 oz) 86.4 kg (190 lb 7.6 oz)    Examination:  General exam: NAD Respiratory system: Lungs clear to auscultation bilaterally.  No wheezes, no crackles, no rhonchi.  Cardiovascular system: RRR with 3/6 SEM. No rubs, gallops or clicks.  Trace to 1+ left lower extremity edema.  Right stump with less swelling.   Gastrointestinal system: Abdomen is soft, nontender, nondistended, positive bowel sounds.  No rebound.  No guarding. Central nervous system: Alert and oriented x3. No focal neurological deficits. Extremities: Status post right BKA.  Left lower extremity with bandage around the foot.  Skin: No rashes, lesions or ulcers Psychiatry: Judgement and insight appear fair. Mood & affect appropriate.     Data Reviewed: I have personally reviewed following labs and imaging studies  CBC: Recent Labs  Lab 03/03/18 0919  03/04/18 0015 03/05/18 0233  03/06/18 0157 03/07/18 0249 03/07/18 1557 03/08/18 0210 03/09/18 0305  WBC 6.1   < > 8.6 7.5  --  7.8  --  10.0 9.3 7.7  NEUTROABS 3.6  --  6.5  --   --   --   --  7.8*  --   --   HGB 8.0*   < > 8.2* 7.2*   < > 7.3* 6.9* 9.4* 9.2* 8.5*  HCT 26.3*   < > 26.9* 23.8*   < > 24.2* 22.5* 30.2* 28.7* 26.2*  MCV 84.8   < > 86.5 85.0  --  88.0  --  84.8 84.4 84.5  PLT 309   < > 332 275  --  304  --  305 322 321   < > = values in this interval not displayed.   Basic Metabolic Panel: Recent Labs  Lab 03/04/18 0015 03/05/18 0233 03/06/18 0157 03/07/18 0249 03/08/18 0210 03/09/18 0305  NA 135 135 134* 134* 135 133*  K 4.1 4.1 4.6 4.4 3.7 3.4*  CL 102 104 105 104 99* 97*  CO2 24 22 21* 22 24 27   GLUCOSE 129* 87 122* 102* 80 82  BUN 12 11 15 20  23* 26*  CREATININE 1.14* 1.32* 1.45* 1.27* 1.17* 1.42*  CALCIUM 9.5 8.8* 9.1 9.0 9.5 9.3  MG 1.8 2.1  --   --   --   --    GFR: Estimated Creatinine Clearance: 32.9 mL/min (A) (by C-G formula based on SCr of 1.42 mg/dL (H)). Liver Function Tests: No results for input(s): AST, ALT, ALKPHOS, BILITOT, PROT, ALBUMIN in the last 168 hours. No results for input(s): LIPASE, AMYLASE in the last 168 hours. No results for input(s): AMMONIA in the last 168 hours. Coagulation Profile: No results for input(s): INR, PROTIME in the last 168 hours. Cardiac Enzymes: No results for input(s):  CKTOTAL, CKMB, CKMBINDEX,  TROPONINI in the last 168 hours. BNP (last 3 results) No results for input(s): PROBNP in the last 8760 hours. HbA1C: No results for input(s): HGBA1C in the last 72 hours. CBG: No results for input(s): GLUCAP in the last 168 hours. Lipid Profile: No results for input(s): CHOL, HDL, LDLCALC, TRIG, CHOLHDL, LDLDIRECT in the last 72 hours. Thyroid Function Tests: Recent Labs    03/08/18 0210  TSH 4.422   Anemia Panel: No results for input(s): VITAMINB12, FOLATE, FERRITIN, TIBC, IRON, RETICCTPCT in the last 72 hours. Sepsis Labs: No results for input(s): PROCALCITON, LATICACIDVEN in the last 168 hours.  No results found for this or any previous visit (from the past 240 hour(s)).       Radiology Studies: Dg Chest Port 1 View  Result Date: 03/07/2018 CLINICAL DATA:  Shortness of breath and chest pain for 1 day. EXAM: PORTABLE CHEST 1 VIEW COMPARISON:  Chest radiograph August 29, 2017 FINDINGS: Cardiac silhouette is normal in size. Calcified aortic knob. Worsening interstitial prominence. Bandlike density LEFT lower lung zone. Kerley B-lines. No pleural effusion or focal consolidation. No pneumothorax. Osteopenia and dextroscoliosis. IMPRESSION: Worsening interstitial prominence seen with pulmonary edema and/or atypical infection. LEFT lung base atelectasis/scarring. Aortic Atherosclerosis (ICD10-I70.0). Electronically Signed   By: Elon Alas M.D.   On: 03/07/2018 15:06        Scheduled Meds: . amitriptyline  50 mg Oral QHS  . aspirin EC  81 mg Oral Daily  . atorvastatin  10 mg Oral q1800  . calcium-vitamin D  1 tablet Oral Q breakfast  . clopidogrel  75 mg Oral Q breakfast  . diltiazem  120 mg Oral Q breakfast  . docusate sodium  100 mg Oral Daily  . escitalopram  10 mg Oral Daily  . famotidine  20 mg Oral Q1500  . feeding supplement (ENSURE ENLIVE)  237 mL Oral TID WC  . fentaNYL  25 mcg Transdermal Q72H  . ferrous sulfate  325 mg Oral BID WC  . furosemide  20 mg  Oral Daily  . heparin  5,000 Units Subcutaneous Q8H  . hydrALAZINE  75 mg Oral QID  . hydrocortisone cream   Topical BID  . levothyroxine  175 mcg Oral QAC breakfast  . loratadine  10 mg Oral Daily  . multivitamin with minerals  1 tablet Oral Daily  . oxyCODONE  5 mg Oral Q4H  . pantoprazole  40 mg Oral BID  . polyethylene glycol  17 g Oral BID  . senna-docusate  2 tablet Oral BID  . sodium chloride flush  3 mL Intravenous Q12H  . sorbitol  30 mL Oral Once  . vitamin C  500 mg Oral Daily   Continuous Infusions: . sodium chloride       LOS: 14 days    Time spent: 40 minutes    Irine Seal, MD [facility] Pager [phone]  If 7PM-7AM, please contact night-coverage [email or web address] Password [facility] 03/09/2018, 10:44 AM

## 2018-03-09 NOTE — Clinical Social Work Note (Signed)
CSW following for patient discharge and was informed this morning by patient's nurse that she is not medically stable for discharge at this time. CSW requested that PT see patient prior to discharge due to facility's concern regarding her using the slide board. Contact made with [name] at [facility] and  Informed her that patient not stable for discharge today. CSW will continue to follow and facilitate discharge when medically stable.  Noor Witte Givens, MSW, LCSW Licensed Clinical Social Worker [place] [phone]

## 2018-03-09 NOTE — Progress Notes (Signed)
Dr. Grandville Silos paged about patients BP

## 2018-03-09 NOTE — Progress Notes (Signed)
Responded to page from nurse- Patient needing procedure at bedside and requesting prayer.  Upon arrival, patient informed chaplain she needed her [place] priest for last rites.  Chaplain inquired as to [place] and patiennt infomred chaplain she was part of [place].  Chaplain explained to staff patient requestung catholic priest, and chaplain would call to see if he was avaialble, due to critical nature of procedure.  Called [facility] and reached the Father, who agreed to come as requested and would take approx 20 mins due to distance.  relayed to staff and chaplain waited to escort Father to patient room.  Upon arrival, chaplain remained available to support Father Joe and guided him back out after ministerial visit was completed.

## 2018-03-09 NOTE — Progress Notes (Signed)
Dr. Grandville Silos made aware of patients black bloody stool. Dr. Grandville Silos at bedside to assess and discuss POC with patient. New orders received.   Emelda Fear, RN

## 2018-03-09 NOTE — H&P (Addendum)
  83 year old female was initially seen as a consult on 03/06/17 for anemia and heme positive stool. She was admitted for ischemic ulceration of left foot and was found to be anemic with a hemoglobin of 6.9 on admission. Last colonoscopy reported from February 2002 which showed a tortuous colon with no significant findings.  Patient reported to have multiple large melanotic bowel movement and hypotension today. She has received 2 units of PRBC transfusion and was transferred to ICU and started on IV levophed along with IV Protonix drip.  She underwent atherectomy with stenting of left superficial femoral artery, mechanical thrombectomy of left superficial femoral artery after an aortogram on 03/03/18 and has been on aspirin as well as Plavix, last dose today morning. She has a right above-knee amputation. Her hemoglobin yesterday was 8.5 and dropped to 7.5 today morning prior to blood transfusions. BUN/creatinine ratio is 26/1.43.   BP 87/54 mm Hg HR 92/min  General: pallor Abdomen: soft, non distended, non tender, hyperactive BS Neuro: Awake, alert, able to move bot upper and left lower extremity Ext: R AKA, left leg dressing, chronic skin changes noted  Discussed the risk and benefits of a diagnostic endoscopy. As this needs to be done emergently due to ongoing melena and hemodynamic instability, will perform it at bedside with moderate sedation. Patient understands and consents. She doesn't want [place] to inform her family members, I advised her that we would be happy to notify and update her husband regarding her current critical state and the need for an emergent procedure, however, she doesn't want her husband to be notified.   Ronnette Juniper, M.D.

## 2018-03-09 NOTE — Progress Notes (Signed)
Patient's blood pressures remain low despite receiving 1L NS bolus on arrival to unit and another liter NS bolus presently AND on 38mcg levophed. Dr. Pearline Cables stated to continue giving IVF and monitor BP closely, but declined to add another pressor. No arterial line at present, only able to use cuffed pressures. Patient diaphoretic, pale, and drowsy when BP is low. Also receiving blood transfusion at this time.  Joellen Jersey, RN

## 2018-03-09 NOTE — Significant Event (Signed)
Rapid Response Event Note RN called for SBP 60-70's  Overview: Time Called: 1615 Arrival Time: 1620 Event Type: Hypotension  Initial Focused Assessment: On arrival pt lying supine in bed, skin pale and clammy, alert and oriented x4, follows commands. Per RN pt's SBP had been between 60-70's since noon today. Dr. Grandville Silos had ordered 2 units PRBC's after Hgb 7.5 and several dark colored stools. Pt's SBP remained in the 60's after first unit PRBC's. Dr. Grandville Silos to bedside, PCCM contacted. Levophed gtt started, pt transported to 2h    Interventions: Levophed gtt 20 g RLFA 1 L NS bolus  Event Summary: Name of Physician Notified: Dr. Grandville Silos  at [phone]  Name of Consulting Physician Notified: Dr. Pearline Cables at 1630  Outcome: Transferred (Comment)(2h03)  Event End Time: [place], [place]

## 2018-03-10 ENCOUNTER — Other Ambulatory Visit: Payer: Self-pay | Admitting: *Deleted

## 2018-03-10 ENCOUNTER — Encounter (HOSPITAL_COMMUNITY): Payer: Self-pay | Admitting: Gastroenterology

## 2018-03-10 ENCOUNTER — Encounter (HOSPITAL_COMMUNITY)
Admission: EM | Disposition: A | Discharge status: Home Health | Payer: Self-pay | Source: Home / Self Care | Attending: Internal Medicine

## 2018-03-10 DIAGNOSIS — D500 Iron deficiency anemia secondary to blood loss (chronic): Secondary | ICD-10-CM

## 2018-03-10 DIAGNOSIS — N179 Acute kidney failure, unspecified: Secondary | ICD-10-CM

## 2018-03-10 DIAGNOSIS — N390 Urinary tract infection, site not specified: Secondary | ICD-10-CM

## 2018-03-10 DIAGNOSIS — K921 Melena: Secondary | ICD-10-CM

## 2018-03-10 LAB — MRSA PCR SCREENING: MRSA BY PCR: NEGATIVE

## 2018-03-10 LAB — CBC WITH DIFFERENTIAL/PLATELET
BASOS ABS: 0 10*3/uL (ref 0.0–0.1)
BASOS PCT: 0 %
Basophils Absolute: 0 10*3/uL (ref 0.0–0.1)
Basophils Relative: 0 %
EOS PCT: 1 %
Eosinophils Absolute: 0 10*3/uL (ref 0.0–0.7)
Eosinophils Absolute: 0.1 10*3/uL (ref 0.0–0.7)
Eosinophils Relative: 0 %
HCT: 21.6 % — ABNORMAL LOW (ref 36.0–46.0)
HEMATOCRIT: 28.8 % — AB (ref 36.0–46.0)
HEMOGLOBIN: 10 g/dL — AB (ref 12.0–15.0)
HEMOGLOBIN: 7.5 g/dL — AB (ref 12.0–15.0)
LYMPHS PCT: 11 %
Lymphocytes Relative: 18 %
Lymphs Abs: 2.5 10*3/uL (ref 0.7–4.0)
Lymphs Abs: 2.7 10*3/uL (ref 0.7–4.0)
MCH: 29.2 pg (ref 26.0–34.0)
MCH: 29.6 pg (ref 26.0–34.0)
MCHC: 34.7 g/dL (ref 30.0–36.0)
MCHC: 34.7 g/dL (ref 30.0–36.0)
MCV: 84 fL (ref 78.0–100.0)
MCV: 85.4 fL (ref 78.0–100.0)
MONO ABS: 1.3 10*3/uL — AB (ref 0.1–1.0)
MONOS PCT: 6 %
Monocytes Absolute: 1.4 10*3/uL — ABNORMAL HIGH (ref 0.1–1.0)
Monocytes Relative: 9 %
NEUTROS ABS: 18.9 10*3/uL — AB (ref 1.7–7.7)
NEUTROS PCT: 83 %
NEUTROS PCT: 72 %
Neutro Abs: 10.8 10*3/uL — ABNORMAL HIGH (ref 1.7–7.7)
PLATELETS: 201 10*3/uL (ref 150–400)
Platelets: 208 10*3/uL (ref 150–400)
RBC: 3.43 MIL/uL — AB (ref 3.87–5.11)
RBC: 2.53 MIL/uL — ABNORMAL LOW (ref 3.87–5.11)
RDW: 14.5 % (ref 11.5–15.5)
RDW: 15.1 % (ref 11.5–15.5)
WBC: 22.8 10*3/uL — AB (ref 4.0–10.5)
WBC: 14.9 10*3/uL — ABNORMAL HIGH (ref 4.0–10.5)

## 2018-03-10 LAB — BASIC METABOLIC PANEL
ANION GAP: 9 (ref 5–15)
BUN: 34 mg/dL — ABNORMAL HIGH (ref 6–20)
CALCIUM: 7.6 mg/dL — AB (ref 8.9–10.3)
CO2: 22 mmol/L (ref 22–32)
Chloride: 104 mmol/L (ref 101–111)
Creatinine, Ser: 2.11 mg/dL — ABNORMAL HIGH (ref 0.44–1.00)
GFR, EST AFRICAN AMERICAN: 24 mL/min — AB (ref >60)
GFR, EST NON AFRICAN AMERICAN: 21 mL/min — AB (ref >60)
Glucose, Bld: 223 mg/dL — ABNORMAL HIGH (ref 65–99)
POTASSIUM: 4.7 mmol/L (ref 3.5–5.1)
SODIUM: 135 mmol/L (ref 135–145)

## 2018-03-10 LAB — PROTIME-INR
INR: 1.31
Prothrombin Time: 16.2 seconds — ABNORMAL HIGH (ref 11.4–15.2)

## 2018-03-10 LAB — GLUCOSE, CAPILLARY
Glucose-Capillary: 114 mg/dL — ABNORMAL HIGH (ref 65–99)
Glucose-Capillary: 130 mg/dL — ABNORMAL HIGH (ref 65–99)
Glucose-Capillary: 151 mg/dL — ABNORMAL HIGH (ref 65–99)

## 2018-03-10 LAB — PROCALCITONIN: Procalcitonin: 1.01 ng/mL

## 2018-03-10 LAB — LACTIC ACID, PLASMA: LACTIC ACID, VENOUS: 1.4 mmol/L (ref 0.5–1.9)

## 2018-03-10 LAB — COOXEMETRY PANEL
CARBOXYHEMOGLOBIN: 1.3 % (ref 0.5–1.5)
Methemoglobin: 1.8 % — ABNORMAL HIGH (ref 0.0–1.5)
O2 Saturation: 67.7 %
Total hemoglobin: 7.6 g/dL — ABNORMAL LOW (ref 12.0–16.0)

## 2018-03-10 LAB — CORTISOL: CORTISOL PLASMA: 44 ug/dL

## 2018-03-10 SURGERY — EGD (ESOPHAGOGASTRODUODENOSCOPY)
Anesthesia: Monitor Anesthesia Care

## 2018-03-10 MED ORDER — INSULIN ASPART 100 UNIT/ML ~~LOC~~ SOLN
1.0000 [IU] | SUBCUTANEOUS | Status: DC
  Administered 2018-03-10: 2 [IU] via SUBCUTANEOUS
  Administered 2018-03-10 – 2018-03-13 (×4): 1 [IU] via SUBCUTANEOUS

## 2018-03-10 MED ORDER — HYDROCORTISONE NA SUCCINATE PF 100 MG IJ SOLR
50.0000 mg | Freq: Four times a day (QID) | INTRAMUSCULAR | Status: DC
  Administered 2018-03-10 – 2018-03-11 (×4): 50 mg via INTRAVENOUS
  Filled 2018-03-10 (×4): qty 2

## 2018-03-10 MED ORDER — SODIUM CHLORIDE 0.9% FLUSH: 10.0000 mL | INTRAVENOUS | Status: DC | PRN

## 2018-03-10 MED ORDER — CHLORHEXIDINE GLUCONATE CLOTH 2 % EX PADS: 6.0000 | MEDICATED_PAD | Freq: Every day | CUTANEOUS | Status: DC

## 2018-03-10 MED ORDER — CHLORHEXIDINE GLUCONATE 0.12 % MT SOLN
15.0000 mL | Freq: Two times a day (BID) | OROMUCOSAL | Status: DC
  Administered 2018-03-10 – 2018-03-17 (×12): 15 mL via OROMUCOSAL
  Filled 2018-03-10 (×13): qty 15

## 2018-03-10 MED ORDER — PEG 3350-KCL-NA BICARB-NACL 420 G PO SOLR
4000.0000 mL | Freq: Once | ORAL | Status: AC
Start: 2018-03-10 — End: 2018-03-10
  Administered 2018-03-10: 4000 mL via ORAL
  Filled 2018-03-10: qty 4000

## 2018-03-10 MED ORDER — SODIUM CHLORIDE 0.9% FLUSH
10.0000 mL | Freq: Two times a day (BID) | INTRAVENOUS | Status: DC
  Administered 2018-03-10: 30 mL

## 2018-03-10 MED ORDER — CHLORHEXIDINE GLUCONATE CLOTH 2 % EX PADS
6.0000 | MEDICATED_PAD | Freq: Every day | CUTANEOUS | Status: DC
  Administered 2018-03-10 – 2018-03-11 (×2): 6 via TOPICAL

## 2018-03-10 MED ORDER — SODIUM CHLORIDE 0.9% FLUSH
10.0000 mL | Freq: Two times a day (BID) | INTRAVENOUS | Status: DC
  Administered 2018-03-11: 10 mL
  Administered 2018-03-11: 30 mL
  Administered 2018-03-12: 10 mL

## 2018-03-10 MED ORDER — OXYCODONE HCL 5 MG PO TABS
5.0000 mg | ORAL_TABLET | Freq: Four times a day (QID) | ORAL | Status: DC | PRN
  Administered 2018-03-13 – 2018-03-17 (×4): 5 mg via ORAL
  Filled 2018-03-10 (×5): qty 1

## 2018-03-10 MED ORDER — SODIUM CHLORIDE 0.9 % IV SOLN: INTRAVENOUS | Status: DC | PRN

## 2018-03-10 MED ORDER — ORAL CARE MOUTH RINSE
15.0000 mL | Freq: Two times a day (BID) | OROMUCOSAL | Status: DC
  Administered 2018-03-12 – 2018-03-16 (×6): 15 mL via OROMUCOSAL

## 2018-03-10 NOTE — Op Note (Addendum)
[facility] Patient Name: Shelby Reese Procedure Date : 03/09/2018 MRN: [id number] Attending MD: Ronnette Juniper , MD Date of Birth: 05-Apr-1935 CSN: [id number] Age: 83 Admit Type: Inpatient Procedure:                Upper GI endoscopy Indications:              Melena Providers:                Ronnette Juniper, MD, Angus Seller, Tinnie Gens,                            Technician Referring MD:              Medicines:                Midazolam 3 mg IV Complications:            No immediate complications. Estimated Blood Loss:     Estimated blood loss: none. Procedure:                Pre-Anesthesia Assessment:                           - Prior to the procedure, a History and Physical                            was performed, and patient medications and                            allergies were reviewed. The patient's tolerance of                            previous anesthesia was also reviewed. The risks                            and benefits of the procedure and the sedation                            options and risks were discussed with the patient.                            All questions were answered, and informed consent                            was obtained. Prior Anticoagulants: The patient has                            taken Plavix (clopidogrel), last dose was day of                            procedure. ASA Grade Assessment: III - A patient                            with severe systemic disease. After reviewing the  risks and benefits, the patient was deemed in                            satisfactory condition to undergo the procedure.                           After obtaining informed consent, the endoscope was                            passed under direct vision. Throughout the                            procedure, the patient's blood pressure, pulse, and                            oxygen saturations were monitored continuously. The                             [id number] ([id number]) scope was introduced through the                            mouth, and advanced to the second part of duodenum.                            The upper GI endoscopy was accomplished without                            difficulty. The patient tolerated the procedure                            well. Scope In: Scope Out: Findings:      The examined esophagus was normal.      The Z-line was regular.      A single 9 mm pedunculated polyp with no bleeding and no stigmata of       recent bleeding was found in the cardia. Polypectomy was not attempted       due to the patient taking anticoagulation medication.      The gastric fundus, gastric body, greater curvature of the stomach,       lesser curvature of the stomach, incisura, gastric antrum, prepyloric       region of the stomach and pylorus were normal.      The examined duodenum was normal. Impression:               - Normal esophagus.                           - Z-line regular.                           - A single gastric polyp. Resection not attempted.                           - Normal gastric fundus, gastric body, greater  curvature of the stomach, lesser curvature of the                            stomach, incisura, antrum, prepyloric region of the                            stomach and pylorus.                           - Normal examined duodenum.                           - No specimens collected. Recommendation:           - Clear liquid diet.                           - Prep for colonoscopy in am.                           - Perform a colonoscopy tomorrow. Procedure Code(s):        --- Professional ---                           [phone], Esophagogastroduodenoscopy, flexible,                            transoral; diagnostic, including collection of                            specimen(s) by brushing or washing, when performed                            (separate  procedure) Diagnosis Code(s):        --- Professional ---                           K31.7, Polyp of stomach and duodenum                           K92.1, Melena (includes Hematochezia) CPT copyright 2017 American Medical Association. All rights reserved. The codes documented in this report are preliminary and upon coder review may  be revised to meet current compliance requirements. Ronnette Juniper, MD 03/10/2018 1:07:43 PM This report has been signed electronically. Number of Addenda: 0

## 2018-03-10 NOTE — Progress Notes (Signed)
Bladder scanned pt, 49ml noted. Pt has no complaints of suprapubic pain/discomfort. Pt stated she's aware of when she has to urinate. MD  In [facility] made aware, no new orders received at this time. purewik remains in place.

## 2018-03-10 NOTE — Progress Notes (Signed)
Inpatient Diabetes Program Recommendations  AACE/ADA: New Consensus Statement on Inpatient Glycemic Control (2015)  Target Ranges:  Prepandial:   less than 140 mg/dL      Peak postprandial:   less than 180 mg/dL (1-2 hours)      Critically ill patients:  140 - 180 mg/dL   Lab Results  Component Value Date   GLUCAP 340 (H) 03/09/2018   HGBA1C 5.6 12/12/2017    Review of Glycemic Control Results for KELSAY, HAGGARD (MRN [id number]) as of 03/10/2018 16:28  Ref. Range 03/09/2018 20:57  Glucose-Capillary Latest Ref Range: 65 - 99 mg/dL 340 (H)   Diabetes history: none Outpatient Diabetes medications: none Current orders for Inpatient glycemic control: ICU order set 1-3 units of Novolog Q4H  Inpatient Diabetes Program Recommendations:    Noted administration of Solucortef, anticipate BS will continue to remain elevated. Noted new ICU order set and expect additional BS to be collected Q4H. Will continue to watch for now as current BS was 184 mg/dL per RN.   Thanks, Bronson Curb, MSN, RNC-OB Diabetes Coordinator [phone] (8a-5p)

## 2018-03-10 NOTE — Progress Notes (Signed)
[facility] Progress Note  Shelby Reese 83 y.o. 04/21/1935  CC:  GI bleed   Subjective:  No further bleeding episodes. Patient denied nausea vomiting. Discussed with nursing staff. No evidence of overt bleeding.  ROS : negative for chest pain. Negative for respiratory distress.   Objective: Vital signs in last 24 hours: Vitals:   03/10/18 1600 03/10/18 1615  BP: (!) 114/44   Pulse:    Resp: (!) 9 10  Temp: (!) 97.1 F (36.2 C)   SpO2: 97% 98%    Physical Exam:  GEN : A/O X 3 , NAD ABD: mild distended, nontender, bowel sounds present. No peritoneal signs. LE : RAKA Lab Results: Recent Labs    03/09/18 0305 03/10/18 0315  NA 133* 135  K 3.4* 4.7  CL 97* 104  CO2 27 22  GLUCOSE 82 223*  BUN 26* 34*  CREATININE 1.42* 2.11*  CALCIUM 9.3 7.6*   No results for input(s): AST, ALT, ALKPHOS, BILITOT, PROT, ALBUMIN in the last 72 hours. Recent Labs    03/10/18 0315 03/10/18 1429  WBC 22.8* 14.9*  NEUTROABS 18.9* 10.8*  HGB 10.0* 7.5*  HCT 28.8* 21.6*  MCV 84.0 85.4  PLT 208 201   Recent Labs    03/10/18 0315  LABPROT 16.2*  INR 1.31      Assessment/Plan: - GI bleed. EGD negative for active bleeding yesterday. - Acute blood loss anemia. - Hemorrhagic shock. - ischemic ulceration of left foot  Recommendations -------------------------- - Patient hemoglobin dropped to 7.5 but there is no evidence of overt bleeding. All cell lines have been decreased which could be from dilution.  - colonoscopy tomorrow. Risks, benefits and alternatives discussed with the patient. Verbalized understanding. - Clear liquid diet today. Nothing by mouth past midnight   Otis Brace MD, FACP 03/10/2018, 4:26 PM  Contact #  [phone]

## 2018-03-10 NOTE — Progress Notes (Signed)
PT Cancellation Note  Patient Details Name: Shelby Reese MRN: [id number] DOB: 1935/08/21   Cancelled Treatment:    Reason Eval/Treat Not Completed: Fatigue/lethargy limiting ability to participate. Will follow-up for PT treatment tomorrow.  Mabeline Caras, PT, DPT Acute Rehab Services  Pager: [place] 03/10/2018, 10:28 AM

## 2018-03-10 NOTE — Progress Notes (Signed)
PULMONARY / CRITICAL CARE MEDICINE   Name: Shelby Reese MRN: [id number] DOB: [birth year]    ADMISSION DATE:  02/23/2018 CONSULTATION DATE:  03/09/2018  REFERRING MD:  Cline Cools CHIEF COMPLAINT:  Melena and hypotension.  HISTORY OF PRESENT ILLNESS:        This is an 83 year old with severe peripheral vascular disease who was initially admitted on 4/8 due to a nonhealing ulcer of the left foot.  She eventually underwent CO2 angiography with subsequent CFA arthrectomy and stent placement on 4/16.  She was placed on dual antiplatelet therapy at that time.  She was already noted to be anemic and guaiac positive.  I was asked to see the patient this afternoon due to the development of melena and hypotension.  She is received only a fraction of a liter of normal saline and packed red blood cells and transferred to the intensive care unit and is now normotensive and in part entirely appropriate and interactive.  She has no complaint of abdominal pain.  She denies a history of prior known significant GI bleeding.  She denies a history of liver disease.  She has not had a drink in many years.  She denies use of nonsteroidals at home.  Her dual antiplatelet therapy has been discontinued and she has been placed on a Protonix infusion.  SUBJECTIVE:  Bedside EGD yesterday without evidence of bleeding Urinary retention overnight with intermittent cath Colonoscopy post-poned till tomorrow as unable to perform bowel prep overnight No more stools overnight Remains on levophed at 20 mcg Hgb stable/improved Tolerated clear liquids  Patient without complaints   VITAL SIGNS: BP (!) 112/49   Pulse 98   Temp 98 F (36.7 C) (Oral)   Resp 13   Ht 5\' 3"  (1.6 m)   Wt 185 lb 13.6 oz (84.3 kg)   SpO2 96%   BMI 32.92 kg/m   HEMODYNAMICS:    VENTILATOR SETTINGS:    INTAKE / OUTPUT: I/O last 3 completed shifts: In: 4066 [P.O.:480; I.V.:1102.9; Blood:1483.1; IV Piggyback:1000] Out: 2601 [Urine:2600;  Stool:1]  PHYSICAL EXAMINATION: General:  Chronically ill appearing elderly female  HEENT: MM pale/moist, Pupils reactive Neuro: Alert and oriented, MAE CV: rrr, no m/r/g PULM: even/non-labored, lungs bilaterally clear anteriorly, minimal rales in R base GI: obese, soft, non-tender, bs active  Extremities: warm/dry, LLE venous stasis with dressing intact, R AKA with some swelling at stump with mild tenderness, no erythema Skin: scattered bruising   LABS:  BMET Recent Labs  Lab 03/08/18 0210 03/09/18 0305 03/10/18 0315  NA 135 133* 135  K 3.7 3.4* 4.7  CL 99* 97* 104  CO2 24 27 22   BUN 23* 26* 34*  CREATININE 1.17* 1.42* 2.11*  GLUCOSE 80 82 223*    Electrolytes Recent Labs  Lab 03/04/18 0015 03/05/18 0233  03/08/18 0210 03/09/18 0305 03/10/18 0315  CALCIUM 9.5 8.8*   < > 9.5 9.3 7.6*  MG 1.8 2.1  --   --   --   --    < > = values in this interval not displayed.    CBC Recent Labs  Lab 03/08/18 0210 03/09/18 0305 03/09/18 1037 03/10/18 0315  WBC 9.3 7.7  --  22.8*  HGB 9.2* 8.5* 7.5* 10.0*  HCT 28.7* 26.2* 23.5* 28.8*  PLT 322 321  --  208    Coag's Recent Labs  Lab 03/10/18 0315  INR 1.31    Sepsis Markers No results for input(s): LATICACIDVEN, PROCALCITON, O2SATVEN in the last 168 hours.  ABG No results for input(s): PHART, PCO2ART, PO2ART in the last 168 hours.  Liver Enzymes No results for input(s): AST, ALT, ALKPHOS, BILITOT, ALBUMIN in the last 168 hours.  Cardiac Enzymes No results for input(s): TROPONINI, PROBNP in the last 168 hours.  Glucose Recent Labs  Lab 03/09/18 2057  GLUCAP 340*    Imaging Dg Chest Port 1 View  Result Date: 03/09/2018 CLINICAL DATA:  Central line placement. EXAM: PORTABLE CHEST 1 VIEW COMPARISON:  03/07/2018. FINDINGS: Low lung volumes. LEFT subclavian line has been placed. Tip lies at the cavoatrial junction. There is no pneumothorax. Slight improvement aeration. Some clearing of edema. Low lung  volumes with scarring on the LEFT appears stable. IMPRESSION: LEFT subclavian line placement, satisfactory position cavoatrial junction. No pneumothorax. Electronically Signed   By: Staci Righter M.D.   On: 03/09/2018 19:42   4/23 EGD >> Normal-appearing esophagus. Regular Z line. Pedunculated polyp in gastric cardia, nonbleeding, not removed( patient had received aspirin and Plavix today morning), appeared hyperplastic. There was no evidence of recent or active bleeding noted in the entire gastric cavity. Retroflexion was otherwise unremarkable. Antrum and pyloric channel appeared unremarkable. Additional bulb and rest of the duodenum appeared unremarkable.  CULTURES: 4/8 UC >> > 100,000 colonies providencia rettgeri 4/09 BC x 2 >> neg 4/22 MRSA PCR >> neg 4/23 BC x 2   ANTIBIOTICS: 4/8 Clindamycin >> 4/17 4/9  Ceftriaxone >> 4/18  SIGNIFICANT EVENTS: 4/8 Admit  4/22 tx to ICU  LINES/TUBES: 4/22 L SVC TL >>  4/22 L R art line >> 4/23 foley >>  ASSESSMENT / PLAN:  PULMONARY A: No acute issues - CXR 4/22 with slight improvement in interstitial prominence, left base scaring - no respiratory symptoms P:   Supplemental O2 prn  Pulm hygiene/ mobilize when able   CARDIOVASCULAR A:  Shock - hypovolemic vs hemorraghic vs septic  PAD with R AKA, HTN - unclear etiology of why patient is still requiring the same amount of pressors despite correction of Hgb and absent of further bleeding.  Increased WBC today.   P:  Tele monitoring Levophed for goal MAP > 65 Assess lactate, cortisol, and co-ox Start solu-cortef 50 mg q 6 till AI r/o out  Consider reassessing TTE (last 11/2017 with EF 60%) Hold asa and plavix with acute GIB Hold cardizem  Continue lipitor  RENAL A:   AKI Urinary retention P:   Insert foley Repeat UA Trend BMP / mag/ phos/ daily wts/  urinary output Replace electrolytes as indicated  GASTROINTESTINAL A:   Suspected GIB - melena  P:   GI  following  EGD normal 4/22 Colonoscopy scheduled for 4/24 Clear liquids and npo after midnight  PPI q 12   HEMATOLOGIC A:   IDA  ABLA- GIB P:  Continue ferrous sulfate Trend CBC q 6 Heparin SQ for now  INFECTIOUS A:   Leukocyotisis  - remains afebrile Ischemic ulceration of left foot Prior UTI- providencia rettgeri  P:   Repeat BCx 2  Send UA Assess PCT Assess CXR in am  Trend WBC/ fever curve   ENDOCRINE A:   Hyperglycemia hypothyroidism P:   Start SSI Assess cortisol   NEUROLOGIC A:   Chronic pain/ depression P:   Monitor Minimize sedation, change oxy to prn, d/c dilaudid, fentanyl patch, and Ambien Continue lexapro   FAMILY  - Updates: Patient updated at bedside.   - Inter-disciplinary family meet or Palliative Care meeting due by: 4/30  CCT 45 mins  Kennieth Rad,  AGACNP-BC [facility] Pgr: [phone] or if no answer [phone] 03/10/2018, 2:39 PM

## 2018-03-10 NOTE — Progress Notes (Signed)
ELink called at [facility] regarding pt's BP and MAP. Systolic BP remained in 35T and MAP 40-50 for over an hr with levophed maxed at 5mcg. Checked BP manually 58/32.  Pt remains alert and oriented, increasely diaphoretic, skin pale, and c/o of SOB even though 100% on 2L [place]. Received orders to increase levophed, see MAR and place A-line for BP accuracy.

## 2018-03-10 NOTE — Progress Notes (Signed)
Very pleasant patient to talk to.  Has concerns about her health and the state of her husband as well.  She says he has a little dementia and can't really take care of things as he looked to her to take care of things.  She tells me that a [employer] came on yesterday and administered communion and she was happy about that.  She will also be on the [place] communion list for Wednesday but it may likely be after her procedure which she is okay with since she was seen and had communion on yesterday.      03/10/18 1131  Clinical Encounter Type  Visited With Patient  Visit Type Follow-up;Spiritual support;Pre-op

## 2018-03-11 ENCOUNTER — Encounter (HOSPITAL_COMMUNITY): Payer: Self-pay | Admitting: Anesthesiology

## 2018-03-11 ENCOUNTER — Encounter (HOSPITAL_COMMUNITY)
Admission: EM | Disposition: A | Discharge status: Home Health | Payer: Self-pay | Source: Home / Self Care | Attending: Internal Medicine

## 2018-03-11 ENCOUNTER — Ambulatory Visit: Payer: Self-pay | Admitting: *Deleted

## 2018-03-11 LAB — TYPE AND SCREEN
ABO/RH(D): A POS
Antibody Screen: NEGATIVE
UNIT DIVISION: 0
UNIT DIVISION: 0
UNIT DIVISION: 0
Unit division: 0
Unit division: 0
Unit division: 0
Unit division: 0
Unit division: 0

## 2018-03-11 LAB — GLUCOSE, CAPILLARY
Glucose-Capillary: 109 mg/dL — ABNORMAL HIGH (ref 65–99)
Glucose-Capillary: 118 mg/dL — ABNORMAL HIGH (ref 65–99)
Glucose-Capillary: 121 mg/dL — ABNORMAL HIGH (ref 65–99)
Glucose-Capillary: 127 mg/dL — ABNORMAL HIGH (ref 65–99)
Glucose-Capillary: 129 mg/dL — ABNORMAL HIGH (ref 65–99)
Glucose-Capillary: 97 mg/dL (ref 65–99)

## 2018-03-11 LAB — RENAL FUNCTION PANEL
Albumin: 2.4 g/dL — ABNORMAL LOW (ref 3.5–5.0)
Anion gap: 12 (ref 5–15)
BUN: 39 mg/dL — AB (ref 6–20)
CHLORIDE: 103 mmol/L (ref 101–111)
CO2: 20 mmol/L — AB (ref 22–32)
Calcium: 7.6 mg/dL — ABNORMAL LOW (ref 8.9–10.3)
Creatinine, Ser: 2.09 mg/dL — ABNORMAL HIGH (ref 0.44–1.00)
GFR calc Af Amer: 25 mL/min — ABNORMAL LOW (ref >60)
GFR calc non Af Amer: 21 mL/min — ABNORMAL LOW (ref >60)
GLUCOSE: 121 mg/dL — AB (ref 65–99)
Phosphorus: 5.4 mg/dL — ABNORMAL HIGH (ref 2.5–4.6)
Potassium: 4.6 mmol/L (ref 3.5–5.1)
SODIUM: 135 mmol/L (ref 135–145)

## 2018-03-11 LAB — CBC WITH DIFFERENTIAL/PLATELET
BASOS ABS: 0 10*3/uL (ref 0.0–0.1)
BASOS ABS: 0 10*3/uL (ref 0.0–0.1)
BASOS PCT: 0 %
BASOS PCT: 0 %
Basophils Absolute: 0 10*3/uL (ref 0.0–0.1)
Basophils Absolute: 0 10*3/uL (ref 0.0–0.1)
Basophils Relative: 0 %
Basophils Relative: 0 %
EOS ABS: 0.1 10*3/uL (ref 0.0–0.7)
EOS ABS: 0 10*3/uL (ref 0.0–0.7)
EOS ABS: 0 10*3/uL (ref 0.0–0.7)
EOS PCT: 0 %
EOS PCT: 0 %
Eosinophils Absolute: 0 10*3/uL (ref 0.0–0.7)
Eosinophils Relative: 0 %
Eosinophils Relative: 0 %
HCT: 17.3 % — ABNORMAL LOW (ref 36.0–46.0)
HEMATOCRIT: 20.2 % — AB (ref 36.0–46.0)
HEMATOCRIT: 21.7 % — AB (ref 36.0–46.0)
HEMATOCRIT: 21.7 % — AB (ref 36.0–46.0)
HEMOGLOBIN: 6.9 g/dL — AB (ref 12.0–15.0)
HEMOGLOBIN: 7.2 g/dL — AB (ref 12.0–15.0)
Hemoglobin: 7.4 g/dL — ABNORMAL LOW (ref 12.0–15.0)
Hemoglobin: 5.6 g/dL — CL (ref 12.0–15.0)
LYMPHS ABS: 1.4 10*3/uL (ref 0.7–4.0)
LYMPHS ABS: 1.3 10*3/uL (ref 0.7–4.0)
LYMPHS PCT: 10 %
LYMPHS PCT: 10 %
Lymphocytes Relative: 11 %
Lymphocytes Relative: 15 %
Lymphs Abs: 1.2 10*3/uL (ref 0.7–4.0)
Lymphs Abs: 1.4 10*3/uL (ref 0.7–4.0)
MCH: 29.6 pg (ref 26.0–34.0)
MCH: 28.9 pg (ref 26.0–34.0)
MCH: 29.7 pg (ref 26.0–34.0)
MCH: 29.2 pg (ref 26.0–34.0)
MCHC: 34.2 g/dL (ref 30.0–36.0)
MCHC: 33.2 g/dL (ref 30.0–36.0)
MCHC: 34.1 g/dL (ref 30.0–36.0)
MCHC: 32.9 g/dL (ref 30.0–36.0)
MCV: 86.7 fL (ref 78.0–100.0)
MCV: 87.1 fL (ref 78.0–100.0)
MCV: 87.1 fL (ref 78.0–100.0)
MCV: 88.7 fL (ref 78.0–100.0)
MONOS PCT: 5 %
MONOS PCT: 4 %
MONOS PCT: 7 %
Monocytes Absolute: 0.6 10*3/uL (ref 0.1–1.0)
Monocytes Absolute: 0.6 10*3/uL (ref 0.1–1.0)
Monocytes Absolute: 0.4 10*3/uL (ref 0.1–1.0)
Monocytes Absolute: 0.6 10*3/uL (ref 0.1–1.0)
Monocytes Relative: 5 %
NEUTROS ABS: 9.7 10*3/uL — AB (ref 1.7–7.7)
NEUTROS PCT: 85 %
Neutro Abs: 11.7 10*3/uL — ABNORMAL HIGH (ref 1.7–7.7)
Neutro Abs: 9.9 10*3/uL — ABNORMAL HIGH (ref 1.7–7.7)
Neutro Abs: 7.1 10*3/uL (ref 1.7–7.7)
Neutrophils Relative %: 85 %
Neutrophils Relative %: 85 %
Neutrophils Relative %: 78 %
PLATELETS: 137 10*3/uL — AB (ref 150–400)
Platelets: 178 10*3/uL (ref 150–400)
Platelets: 153 10*3/uL (ref 150–400)
Platelets: 143 10*3/uL — ABNORMAL LOW (ref 150–400)
RBC: 2.33 MIL/uL — AB (ref 3.87–5.11)
RBC: 2.49 MIL/uL — ABNORMAL LOW (ref 3.87–5.11)
RBC: 2.49 MIL/uL — ABNORMAL LOW (ref 3.87–5.11)
RBC: 1.95 MIL/uL — ABNORMAL LOW (ref 3.87–5.11)
RDW: 15.8 % — ABNORMAL HIGH (ref 11.5–15.5)
RDW: 15.1 % (ref 11.5–15.5)
RDW: 15.4 % (ref 11.5–15.5)
RDW: 15.8 % — ABNORMAL HIGH (ref 11.5–15.5)
WBC: 13.8 10*3/uL — ABNORMAL HIGH (ref 4.0–10.5)
WBC: 11.7 10*3/uL — ABNORMAL HIGH (ref 4.0–10.5)
WBC: 11.4 10*3/uL — ABNORMAL HIGH (ref 4.0–10.5)
WBC: 9.1 10*3/uL (ref 4.0–10.5)

## 2018-03-11 LAB — BPAM RBC
BLOOD PRODUCT EXPIRATION DATE: 201905192359
BLOOD PRODUCT EXPIRATION DATE: 201905192359
BLOOD PRODUCT EXPIRATION DATE: 201905202359
Blood Product Expiration Date: 201905182359
Blood Product Expiration Date: 201905182359
Blood Product Expiration Date: 201905192359
Blood Product Expiration Date: 201905192359
Blood Product Expiration Date: 201905192359
ISSUE DATE / TIME: 201904200609
ISSUE DATE / TIME: 201904221250
ISSUE DATE / TIME: 201904221906
ISSUE DATE / TIME: 201904201135
ISSUE DATE / TIME: 201904221608
ISSUE DATE / TIME: 201904222031
UNIT TYPE AND RH: 6200
UNIT TYPE AND RH: 6200
UNIT TYPE AND RH: 6200
Unit Type and Rh: 6200
Unit Type and Rh: 6200
Unit Type and Rh: 6200
Unit Type and Rh: 6200
Unit Type and Rh: 6200

## 2018-03-11 LAB — PROCALCITONIN: PROCALCITONIN: 0.56 ng/mL

## 2018-03-11 LAB — PREPARE RBC (CROSSMATCH)

## 2018-03-11 LAB — MAGNESIUM: Magnesium: 1.6 mg/dL — ABNORMAL LOW (ref 1.7–2.4)

## 2018-03-11 SURGERY — COLONOSCOPY WITH PROPOFOL
Anesthesia: Monitor Anesthesia Care

## 2018-03-11 SURGERY — CANCELLED PROCEDURE
Anesthesia: Monitor Anesthesia Care

## 2018-03-11 MED ORDER — POLYETHYLENE GLYCOL 3350 17 G PO PACK
17.0000 g | PACK | ORAL | Status: AC
  Administered 2018-03-11 (×6): 17 g via ORAL
  Filled 2018-03-11 (×5): qty 1

## 2018-03-11 MED ORDER — MAGNESIUM SULFATE IN D5W 1-5 GM/100ML-% IV SOLN
1.0000 g | Freq: Once | INTRAVENOUS | Status: AC
  Administered 2018-03-11: 1 g via INTRAVENOUS
  Filled 2018-03-11: qty 100

## 2018-03-11 MED ORDER — SODIUM CHLORIDE 0.9 % IV SOLN: INTRAVENOUS | Status: DC

## 2018-03-11 MED ORDER — SODIUM CHLORIDE 0.9 % IV SOLN: Freq: Once | INTRAVENOUS | Status: DC

## 2018-03-11 MED ORDER — SODIUM CHLORIDE 0.9 % IV SOLN
Freq: Once | INTRAVENOUS | Status: AC
  Administered 2018-03-11: 02:00:00 via INTRAVENOUS

## 2018-03-11 MED ORDER — PANTOPRAZOLE SODIUM 40 MG PO TBEC
40.0000 mg | DELAYED_RELEASE_TABLET | Freq: Every day | ORAL | Status: DC
  Administered 2018-03-11 – 2018-03-18 (×8): 40 mg via ORAL
  Filled 2018-03-11 (×8): qty 1

## 2018-03-11 MED ORDER — POLYETHYLENE GLYCOL 3350 17 G PO PACK
17.0000 g | PACK | ORAL | Status: DC
  Administered 2018-03-11 (×4): 17 g via ORAL
  Filled 2018-03-11 (×4): qty 1

## 2018-03-11 SURGICAL SUPPLY — 22 items

## 2018-03-11 NOTE — Progress Notes (Signed)
Fentanyl Patch found on patient's right shoulder. Patch discarded in sharps with RN Paris Lore as witness.

## 2018-03-11 NOTE — Anesthesia Preprocedure Evaluation (Deleted)
Anesthesia Evaluation  Patient identified by MRN, date of birth, ID band Patient awake    Reviewed: Allergy & Precautions, H&P , NPO status , Patient's Chart, lab work & pertinent test results  Airway Mallampati: II  TM Distance: >3 FB Neck ROM: Full    Dental  (+) Teeth Intact, Dental Advisory Given   Pulmonary former smoker,    breath sounds clear to auscultation       Cardiovascular hypertension, Pt. on medications + Peripheral Vascular Disease  + Valvular Problems/Murmurs AS and AI  Rhythm:Regular Rate:Normal  '19 TTE - EF of 60% to 65%. Grade 2 diastolic   dysfunction. Moderate AS, moderate AI. Trivial MR. Left atrium was severely dilated. Mild TR. PASP was moderately increased. PA peak pressure: 49 mm Hg   Neuro/Psych Depression negative neurological ROS     GI/Hepatic Neg liver ROS, GERD  Medicated and Controlled,  Endo/Other  Hypothyroidism Obesity  Renal/GU CRFRenal disease  Female GU complaint     Musculoskeletal  (+) Arthritis , Osteoarthritis,  Fibromyalgia -  Abdominal (+) + obese,   Peds  Hematology  (+) anemia ,   Anesthesia Other Findings Hemorrhagic shock on pressors  Reproductive/Obstetrics negative OB ROS                             Anesthesia Physical  Anesthesia Plan  ASA: IV  Anesthesia Plan: MAC   Post-op Pain Management:    Induction:   PONV Risk Score and Plan: Propofol infusion and Treatment may vary due to age or medical condition  Airway Management Planned: Nasal Cannula and Natural Airway  Additional Equipment: None  Intra-op Plan:   Post-operative Plan:   Informed Consent: I have reviewed the patients History and Physical, chart, labs and discussed the procedure including the risks, benefits and alternatives for the proposed anesthesia with the patient or authorized representative who has indicated his/her understanding and acceptance.    Dental advisory given  Plan Discussed with: CRNA and Anesthesiologist  Anesthesia Plan Comments: (A-line and central line already in place)       Anesthesia Quick Evaluation

## 2018-03-11 NOTE — Progress Notes (Signed)
Patient unable to finish [place] was only able to drink half. Re-education with encouragement given throughout shift. MD on call for [facility] called and updated on patient. Orders received to "keep trying to get her to drink as much as she can but if she refuses or unable to tolerate it than that is all we can do."

## 2018-03-11 NOTE — Progress Notes (Signed)
Initial Nutrition Assessment  DOCUMENTATION CODES:   Obesity unspecified  INTERVENTION:   Continue Ensure Enlive po TID, each supplement provides 350 kcal and 20 grams of protein   NUTRITION DIAGNOSIS:   Increased nutrient needs related to wound healing as evidenced by estimated needs.  GOAL:   Patient will meet greater than or equal to 90% of their needs  MONITOR:   Diet advancement, I & O's, Skin  REASON FOR ASSESSMENT:   NPO/Clear Liquid Diet    ASSESSMENT:   Pt with PMH of PVD, GERD, HTN, s/p R AKA 1/19 admitted with LLE wound infection. Dr Oneida Alar has recommended left leg amputation but pt refused.    Spoke with pt who was sleepy at time of visit. Pt known by this RD from previous admission (Jan 2019 for AKA) Pt reports AKA well healed, noted this on exam as well. She had good appetite PTA and has been eating > 50% of meals this admission. She was transferred to ICU 4/23 due to bleeding. EGD normal, pending colonoscopy tomorrow.     Labs reviewed: PO4 5.4 (H), magnesium 1.6 (L) Medications reviewed and include: oscal with D, 1-3 units novolog every 4 hrs, synthroid, MVI, miralax, vitamin C 500 mg daily  NUTRITION - FOCUSED PHYSICAL EXAM:    Most Recent Value  Orbital Region  No depletion  Upper Arm Region  No depletion  Thoracic and Lumbar Region  No depletion  Buccal Region  No depletion  Temple Region  No depletion  Clavicle Bone Region  No depletion  Clavicle and Acromion Bone Region  No depletion  Scapular Bone Region  No depletion  Dorsal Hand  No depletion  Patellar Region  No depletion  Anterior Thigh Region  No depletion  Posterior Calf Region  No depletion  Edema (RD Assessment)  Mild  Hair  Reviewed  Eyes  Reviewed  Mouth  Reviewed [tongue dry appearance]  Skin  Reviewed  Nails  Reviewed       Diet Order:  Fall precautions Diet clear liquid Room service appropriate? Yes; Fluid consistency: Thin  EDUCATION NEEDS:   Education needs have  been addressed  Skin:  Skin Assessment: Skin Integrity Issues:(MASD buttocks) Skin Integrity Issues:: Unstageable Unstageable: L heel  Last BM:  4/24  Height:   Ht Readings from Last 1 Encounters:  03/09/18 5\' 3"  (1.6 m)    Weight:   Wt Readings from Last 1 Encounters:  03/11/18 192 lb 3.9 oz (87.2 kg)    Ideal Body Weight:  48 kg  BMI:  37 (adjusted for R AKA)  Estimated Nutritional Needs:   Kcal:  1700-1900  Protein:  100-115 grams  Fluid:  > 1.7 L/day  Maylon Peppers RD, LDN, CNSC [phone] Pager [phone] After Hours Pager

## 2018-03-11 NOTE — Progress Notes (Signed)
CRITICAL VALUE ALERT  Critical Value:  Hgb 5.6  Date & Time Notied:  03/11/18 1747  Provider Notified: Dr. Pearline Cables at [phone], stated to page Dr. Manuella Ghazi. Called Dr. Manuella Ghazi at [phone].  Orders Received/Actions taken: New orders received to transfuse 2 units PRBC's.

## 2018-03-11 NOTE — Evaluation (Signed)
Occupational Therapy Evaluation Patient Details Name: Shelby Reese MRN: [id number] DOB: 05-30-1935 Today's Date: 03/11/2018    History of Present Illness Pt is an 83 y.o. female with PMH of PAD s/p R AKA (12/15/17), bilateral severe chronic venous stasis, HTN, admitted 02/23/18 with acute on chronic anemia with guaiac positive stools and AKI in the setting of worsening L foot ulcer and infection. Underwent CO2 angiography with subsequent CFA arthrectomy and stent placement on 4/16. Suspected GIB with melena; EGD normal 4/22; awaiting colonoscopy.    Clinical Impression   PTA Pt at ALF where she was performing transfers with transfer board. Pt is currently max A for LB ADL, total A for peri care and toilet needs at this time. Pt was able to perform grooming and eating with set up at bed level with HOB elevated. Pt able to engage trunk and sit EOB as prep for building activity tolerance for ADL. Pt with decreased problem solving and processing this session. OT will continue to follow in the acute setting and will require SNF level therapy at DC. Next session to hopefully progress to transfers with the transfer board.     Follow Up Recommendations  SNF;Supervision/Assistance - 24 hour    Equipment Recommendations  Other (comment)(defer to next venue)    Recommendations for Other Services       Precautions / Restrictions Precautions Precautions: Fall Precaution Comments: recent R AKA (11/2017), L foot ulcer Restrictions Weight Bearing Restrictions: No RLE Weight Bearing: Non weight bearing Other Position/Activity Restrictions: RLE BKA      Mobility Bed Mobility Overal bed mobility: Needs Assistance Bed Mobility: Supine to Sit;Sit to Supine     Supine to sit: Mod assist;HOB elevated;+2 for safety/equipment Sit to supine: Min assist;+2 for safety/equipment   General bed mobility comments: Sat with modA for UE support/trunk elevation and to assist bilat hips to EOB. MinA to assist LLE  back into bed. MaxA+2 to scoot up in bed  Transfers                 General transfer comment: NT    Balance Overall balance assessment: Needs assistance Sitting-balance support: Feet unsupported;Bilateral upper extremity supported Sitting balance-Leahy Scale: Fair Sitting balance - Comments: Reliant on UE support for seated balance when L foot not supported                                   ADL either performed or assessed with clinical judgement   ADL Overall ADL's : Needs assistance/impaired Eating/Feeding: Set up;Bed level Eating/Feeding Details (indicate cue type and reason): HOB Elevated Grooming: Oral care;Wash/dry face;Set up;Bed level Grooming Details (indicate cue type and reason): Brushed teeth for 7 min.  Upper Body Bathing: Moderate assistance;Sitting Upper Body Bathing Details (indicate cue type and reason): Therapist cleaned back while Pt sat EOB Lower Body Bathing: Maximal assistance;Sitting/lateral leans Lower Body Bathing Details (indicate cue type and reason): balance for leans requires assist/support Upper Body Dressing : Moderate assistance   Lower Body Dressing: Maximal assistance;Bed level   Toilet Transfer: Total assistance Toilet Transfer Details (indicate cue type and reason): bed level at this time Toileting- Clothing Manipulation and Hygiene: Total assistance         General ADL Comments: Pt is motivaed to do what she can for herself     Vision         Perception     Praxis  Pertinent Vitals/Pain Pain Assessment: Faces Faces Pain Scale: Hurts a little bit Pain Location: LLE Pain Descriptors / Indicators: Sore Pain Intervention(s): Monitored during session;Repositioned     Hand Dominance Right   Extremity/Trunk Assessment Upper Extremity Assessment Upper Extremity Assessment: Overall WFL for tasks assessed   Lower Extremity Assessment Lower Extremity Assessment: Defer to PT evaluation   Cervical / Trunk  Assessment Cervical / Trunk Assessment: Normal   Communication Communication Communication: No difficulties   Cognition Arousal/Alertness: Awake/alert Behavior During Therapy: WFL for tasks assessed/performed Overall Cognitive Status: No family/caregiver present to determine baseline cognitive functioning Area of Impairment: Problem solving;Following commands                       Following Commands: Follows one step commands with increased time     Problem Solving: Slow processing;Decreased initiation;Difficulty sequencing General Comments: Pt appropriately interactive throughout session; demonstrates some slowed processing and problem solving   General Comments       Exercises General Exercises - Lower Extremity Long Arc Quad: AROM;10 reps;Seated   Shoulder Instructions      Home Living Family/patient expects to be discharged to:: Skilled nursing facility                                        Prior Functioning/Environment Level of Independence: Needs assistance  Gait / Transfers Assistance Needed: since amputation January 2019 pt has been doing sliding board transfers to [facility] at ALF     Comments: Retired Therapist, sports.        OT Problem List: Decreased activity tolerance;Impaired balance (sitting and/or standing);Decreased cognition;Decreased safety awareness;Decreased knowledge of use of DME or AE;Cardiopulmonary status limiting activity;Obesity      OT Treatment/Interventions: Self-care/ADL training;Therapeutic exercise;Energy conservation;DME and/or AE instruction;Therapeutic activities;Patient/family education;Balance training    OT Goals(Current goals can be found in the care plan section) Acute Rehab OT Goals Patient Stated Goal: be able to transfer to [facility] OT Goal Formulation: With patient Time For Goal Achievement: 03/25/18 Potential to Achieve Goals: Good ADL Goals Pt Will Perform Grooming: with supervision;sitting Pt Will Transfer to Toilet:  with mod assist;with transfer board;bedside commode Pt Will Perform Toileting - Clothing Manipulation and hygiene: with mod assist;sitting/lateral leans Additional ADL Goal #1: Pt will perform bed mobility at min guard assist level prior to engaging in ADL activity  OT Frequency: Min 2X/week   Barriers to D/C: Decreased caregiver support          Co-evaluation              AM-PAC PT "6 Clicks" Daily Activity     Outcome Measure Help from another person eating meals?: A Little Help from another person taking care of personal grooming?: A Little Help from another person toileting, which includes using toliet, bedpan, or urinal?: Total Help from another person bathing (including washing, rinsing, drying)?: A Lot Help from another person to put on and taking off regular upper body clothing?: A Lot Help from another person to put on and taking off regular lower body clothing?: Total 6 Click Score: 12   End of Session Equipment Utilized During Treatment: Oxygen Nurse Communication: Mobility status  Activity Tolerance: Patient limited by fatigue Patient left: in bed;with call bell/phone within reach;with nursing/sitter in room  OT Visit Diagnosis: Muscle weakness (generalized) (M62.81)  Time: 8891-6945 OT Time Calculation (min): 40 min Charges:  OT General Charges $OT Visit: 1 Visit OT Evaluation $OT Eval Moderate Complexity: 1 Mod G-Codes:     Hulda Humphrey OTR/L [place] 03/11/2018, 3:03 PM

## 2018-03-11 NOTE — Progress Notes (Signed)
[facility] Progress Note Patient Name: Shelby Reese DOB: [birth year] MRN: [id number]   Date of Service  03/11/2018  HPI/Events of Note  Hb this pm=6.9  eICU Interventions  Transfuse 1 unit prbc.         Laverle Hobby 03/11/2018, 12:25 AM

## 2018-03-11 NOTE — Progress Notes (Signed)
PULMONARY / CRITICAL CARE MEDICINE   Name: Shelby Reese MRN: [id number] DOB: 08/27/1935    ADMISSION DATE:  02/23/2018 CONSULTATION DATE:  03/09/2018  REFERRING MD:  Cline Cools CHIEF COMPLAINT:  Melena and hypotension.  HISTORY OF PRESENT ILLNESS:   This is an 83 year old with severe peripheral vascular disease who was initially admitted on 4/8 due to a nonhealing ulcer of the left foot.  She eventually underwent CO2 angiography with subsequent CFA arthrectomy and stent placement on 4/16.  She was placed on dual antiplatelet therapy at that time.  She was already noted to be anemic and guaiac positive.  I was asked to see the patient this afternoon due to the development of melena and hypotension.  She is received only a fraction of a liter of normal saline and packed red blood cells and transferred to the intensive care unit and is now normotensive and in part entirely appropriate and interactive.  She has no complaint of abdominal pain.  She denies a history of prior known significant GI bleeding.  She denies a history of liver disease.  She has not had a drink in many years.  She denies use of nonsteroidals at home.  Her dual antiplatelet therapy has been discontinued and she has been placed on a Protonix infusion.  S/P vascuar stent off AC due to bleeding   SUBJECTIVE:  Bedside EGD  without evidence of bleeding Urinary retention with intermittent cath Colonoscopy planned today  Complains of diarrhoea possbile due to bowe prep Remains on levophed at 2 mcg with BP in 160 range Hgb  6.9 received one unit PRBC overnight Patient without complaints   VITAL SIGNS: BP (!) 109/35   Pulse 98   Temp (!) 97.5 F (36.4 C) (Oral)   Resp 16   Ht 5\' 3"  (1.6 m)   Wt 87.2 kg (192 lb 3.9 oz)   SpO2 94%   BMI 34.05 kg/m   HEMODYNAMICS: CVP:  [3 mmHg-15 mmHg] 6 mmHg  VENTILATOR SETTINGS:    INTAKE / OUTPUT: I/O last 3 completed shifts: In: 5641.6 [P.O.:2040; I.V.:2368.5;  Blood:1233.1] Out: 1140 [Urine:1140]  PHYSICAL EXAMINATION: General:  Chronically ill appearing elderly female  HEENT: MM pale/moist, Pupils reactive Neuro: Alert and oriented, MAE CV: rrr, no m/r/g PULM: even/non-labored, lungs bilaterally clear anteriorly, minimal rales in R base GI: obese, soft, non-tender, bs active  Extremities: warm/dry, LLE venous stasis with dressing intact, R AKA with some swelling at stump with mild tenderness, no erythema Skin: scattered bruising   LABS:  BMET Recent Labs  Lab 03/09/18 0305 03/10/18 0315 03/11/18 0624  NA 133* 135 135  K 3.4* 4.7 4.6  CL 97* 104 103  CO2 27 22 20*  BUN 26* 34* 39*  CREATININE 1.42* 2.11* 2.09*  GLUCOSE 82 223* 121*    Electrolytes Recent Labs  Lab 03/05/18 0233  03/09/18 0305 03/10/18 0315 03/11/18 0624  CALCIUM 8.8*   < > 9.3 7.6* 7.6*  MG 2.1  --   --   --  1.6*  PHOS  --   --   --   --  5.4*   < > = values in this interval not displayed.    CBC Recent Labs  Lab 03/10/18 1429 03/10/18 2327 03/11/18 0624  WBC 14.9* 13.8* 11.7*  HGB 7.5* 6.9* 7.2*  HCT 21.6* 20.2* 21.7*  PLT 201 178 153    Coag's Recent Labs  Lab 03/10/18 0315  INR 1.31    Sepsis Markers Recent Labs  Lab 03/10/18 1455 03/10/18 1456 03/11/18 0624  LATICACIDVEN 1.4  --   --   PROCALCITON  --  1.01 0.56    ABG No results for input(s): PHART, PCO2ART, PO2ART in the last 168 hours.  Liver Enzymes Recent Labs  Lab 03/11/18 0624  ALBUMIN 2.4*    Cardiac Enzymes No results for input(s): TROPONINI, PROBNP in the last 168 hours.  Glucose Recent Labs  Lab 03/09/18 2057 03/10/18 1624 03/10/18 2043 03/10/18 2338 03/11/18 0407 03/11/18 0824  GLUCAP 340* 151* 130* 114* 129* 121*    Imaging No results found. 4/23 EGD >> Normal-appearing esophagus. Regular Z line. Pedunculated polyp in gastric cardia, nonbleeding, not removed( patient had received aspirin and Plavix today morning), appeared  hyperplastic. There was no evidence of recent or active bleeding noted in the entire gastric cavity. Retroflexion was otherwise unremarkable. Antrum and pyloric channel appeared unremarkable. Additional bulb and rest of the duodenum appeared unremarkable.  CULTURES: 4/8 UC >> > 100,000 colonies providencia rettgeri  4/09 BC x 2 >> neg 4/22 MRSA PCR >> neg 4/23 BC x 2   ANTIBIOTICS: 4/8 Clindamycin >> 4/17 4/9  Ceftriaxone >> 4/18  SIGNIFICANT EVENTS: 4/8 Admit  4/22 tx to ICU 4/22 EGD  LINES/TUBES: 4/22 L SVC TL >>  4/22 L R art line >> 4/23 foley >>  ASSESSMENT / PLAN:  PULMONARY A: No acute issues - CXR 4/22 with slight improvement in interstitial prominence, left base scaring - no respiratory symptoms P:   Supplemental O2 prn  Pulm hygiene/ mobilize when able   CARDIOVASCULAR A:  Shock - hypovolemic vs hemorraghic  PAD with R AKA, HTN - unclear etiology of why patient is still requiring the same amount of pressors despite correction of Hgb and absent of further bleeding.  Increased WBC today.   P:  Tele monitoring Levophed for goal MAP > 65-- Coming off now BP in higher range Pending  lactate, cortisol, and co-ox On solu-cortef 50 mg q 6 -- Cortisol level is 44 will dc that as it might cause gi bleed it self Consider reassessing TTE (last 11/2017 with EF 60%)- if continue to remain hypotensive Hold asa and plavix with acute GIB Hold cardizem  Continue lipitor  RENAL A:   AKI Urinary retention P:   Insert foley Trend BMP / mag/ phos/ daily wts/  urinary output Replace electrolytes as indicated MG replaced   GASTROINTESTINAL A:   Suspected GIB - melena  P:   GI following  EGD normal 4/22 Colonoscopy scheduled for 4/24 PPI q 12  Hold heparin SQ  HEMATOLOGIC A:   IDA  ABLA- GIB P:  Continue ferrous sulfate Trend CBC q 6 Hold heparin for now and monitor  INFECTIOUS A:   Leukocyotisis  - remains afebrile Ischemic ulceration of left  foot- S/P stent Vascular is following.  Prior UTI- providencia rettgeri- Treated with antibiotics   P:   Repeat BCx 2 -- Negative so far Repeat UA is pending per notes  PCT ok Trend WBC/ fever curve   ENDOCRINE A:   Hyperglycemia hypothyroidism P:   Start SSI DC Solucortef as cortisol level was 44   NEUROLOGIC A:   Chronic pain/ depression P:   Monitor Minimize sedation, change oxy to prn, d/c dilaudid, fentanyl patch, and Ambien Continue lexapro   FAMILY  - Updates: Patient updated at bedside.   - Inter-disciplinary family meet or Palliative Care meeting due by: 4/30   Skin/Wound: Left leg dressing   Electrolytes: Replace electrolytes  per ICU electrolyte replacement protocol.   IVF: none  Nutrition: NPO for procedure  Prophylaxis: DVT Prophylaxis with scd,. GI Prophylaxis.   Restraints: None  PT/OT eval and treat. OOB when appropriate.   Lines/Tubes: per notes on 4/23 foley  4/22 left sub clavian central line.  ADVANCE DIRECTIVE:full code  FAMILY DISCUSSION:Spoke with patient   Quality Care: PPI, DVT prophylaxis, HOB elevated, Infection control all reviewed and addressed.  Events and notes from last 24 hours reviewed. Care plan discussed on multidisciplinary rounds  CC TIME:32 min   Old records reviewed discussed results and management plan with patient  Images personally reviewed and results and labs reviewed and discussed with patient.  All medication reviewed and adjusted  Further management depending on test results and work up as outlined above.    Lahoma Rocker, M.D

## 2018-03-11 NOTE — Progress Notes (Signed)
OT Cancellation Note  Patient Details Name: Shelby Reese MRN: [id number] DOB: May 22, 1935   Cancelled Treatment:    Reason Eval/Treat Not Completed: Patient at procedure or test/ unavailable(going for Colonoscopy). OT will continue to follow for eval as schedule allows.   [place] 03/11/2018, 8:38 AM  Hulda Humphrey OTR/L [phone]

## 2018-03-11 NOTE — Plan of Care (Signed)
Problem: Clinical Measurements: Goal: Ability to maintain clinical measurements within normal limits will improve Outcome: Progressing Goal: Respiratory complications will improve Outcome: Progressing Goal: Cardiovascular complication will be avoided Outcome: Progressing   Problem: Pain Managment: Goal: General experience of comfort will improve Outcome: Progressing   Problem: Nutrition: Goal: Adequate nutrition will be maintained Outcome: Not Progressing Note:  Still remains NPO or clear liquid diet while prepping for colonoscopy.     Patient is having stable BP's and is currently off levophed. Urine output improved since yesterday; foley catheter in place. Tolerating GI prep so far today.

## 2018-03-11 NOTE — Progress Notes (Signed)
Physical Therapy Treatment Patient Details Name: Shelby Reese MRN: [id number] DOB: 07-30-1935 Today's Date: 03/11/2018    History of Present Illness Pt is an 83 y.o. female with PMH of PAD s/p R AKA (12/15/17), bilateral severe chronic venous stasis, HTN, admitted 02/23/18 with acute on chronic anemia with guaiac positive stools and AKI in the setting of worsening L foot ulcer and infection. Underwent CO2 angiography with subsequent CFA arthrectomy and stent placement on 4/16. Suspected GIB with melena; EGD normal 4/22; awaiting colonoscopy.    PT Comments    Pt seen with OT due to decreased activity tolerance and declining sessions secondary to recent fatigue. Required min-modA (+2 safety) for bed mobility. Reliant on BUE support for balance while sitting EOB and performing LLE therex. Pt demonstrates slowed processing/problem solving, requiring increased time and effort for all mobility. RN requesting no transfer to chair secondary to pt having multiple BMs and dependent for pericare. Continue to recommend SNF-level therapies at d/c.     Follow Up Recommendations  SNF;Supervision for mobility/OOB     Equipment Recommendations  None recommended by PT    Recommendations for Other Services       Precautions / Restrictions Precautions Precautions: Fall Precaution Comments: recent R AKA (11/2017), L foot ulcer Restrictions Other Position/Activity Restrictions: RLE BKA    Mobility  Bed Mobility Overal bed mobility: Needs Assistance Bed Mobility: Supine to Sit;Sit to Supine     Supine to sit: Mod assist;HOB elevated;+2 for safety/equipment Sit to supine: Min assist;+2 for safety/equipment   General bed mobility comments: Sat with modA for UE support/trunk elevation and to assist bilat hips to EOB. MinA to assist LLE back into bed. MaxA+2 to scoot up in bed  Transfers                 General transfer comment: NT  Ambulation/Gait                 Stairs              Wheelchair Mobility    Modified Rankin (Stroke Patients Only)       Balance Overall balance assessment: Needs assistance Sitting-balance support: Feet unsupported;Bilateral upper extremity supported Sitting balance-Leahy Scale: Fair Sitting balance - Comments: Reliant on UE support for seated balance when L foot not supported                                    Cognition Arousal/Alertness: Awake/alert Behavior During Therapy: WFL for tasks assessed/performed Overall Cognitive Status: No family/caregiver present to determine baseline cognitive functioning                                 General Comments: Pt appropriately interactive throughout session; demonstrates some slowed processing and problem solving      Exercises General Exercises - Lower Extremity Long Arc Quad: AROM;10 reps;Seated    General Comments        Pertinent Vitals/Pain Pain Assessment: Faces Faces Pain Scale: Hurts a little bit Pain Location: LLE Pain Descriptors / Indicators: Sore Pain Intervention(s): Monitored during session;Repositioned    Home Living                      Prior Function            PT Goals (current goals can now be found in  the care plan section) Acute Rehab PT Goals Patient Stated Goal: be able to transfer to [facility] PT Goal Formulation: With patient Time For Goal Achievement: 03/18/18 Potential to Achieve Goals: Fair Progress towards PT goals: Progressing toward goals    Frequency    Min 2X/week      PT Plan Current plan remains appropriate    Co-evaluation              AM-PAC PT "6 Clicks" Daily Activity  Outcome Measure  Difficulty turning over in bed (including adjusting bedclothes, sheets and blankets)?: Unable Difficulty moving from lying on back to sitting on the side of the bed? : Unable Difficulty sitting down on and standing up from a chair with arms (e.g., wheelchair, bedside commode, etc,.)?:  Unable Help needed moving to and from a bed to chair (including a wheelchair)?: Total Help needed walking in hospital room?: Total Help needed climbing 3-5 steps with a railing? : Total 6 Click Score: 6    End of Session   Activity Tolerance: Patient tolerated treatment well Patient left: in bed;with call bell/phone within reach;with nursing/sitter in room Nurse Communication: Mobility status PT Visit Diagnosis: Muscle weakness (generalized) (M62.81);Pain Pain - Right/Left: Left     Time: 3016-0109 PT Time Calculation (min) (ACUTE ONLY): 39 min  Charges:  $Therapeutic Activity: 23-37 mins                    G Codes:      Mabeline Caras, PT, DPT Acute Rehab Services  Pager: [place] 03/11/2018, 2:32 PM

## 2018-03-11 NOTE — Progress Notes (Signed)
PT Cancellation Note  Patient Details Name: SHARMEKA PALMISANO MRN: [id number] DOB: 1935-01-14   Cancelled Treatment:    Reason Eval/Treat Not Completed: Patient at procedure or test/unavailable (colonoscopy). Will follow-up for PT treatment this afternoon as schedule permits, pending pt's ability to participate post-sedation.  Mabeline Caras, PT, DPT Acute Rehab Services  Pager: [place] 03/11/2018, 8:37 AM

## 2018-03-11 NOTE — Progress Notes (Signed)
Patient in pre-procedure for colonoscopy.  Patient still having thick dark red stool with clots.  Dr. Watt Climes notified, orders received to cancel procedure for today and return patient to floor for continued prep.  [place], bedside RN notified.  Patient transferred back to 2H03.  Vista Lawman, RN

## 2018-03-11 NOTE — Progress Notes (Signed)
Patient had a large BM this morning and then began q2h miralax prep for colonoscopy tomorrow. She has had 4 doses without having a BM. Dr. Watt Climes notified. New orders received to administer miralax q1h until she's having BM's and then ok to stop at midnight.   Joellen Jersey, RN

## 2018-03-11 NOTE — Progress Notes (Signed)
Edwin Dada 9:21 AM  Subjective: Nursing staff did not administer prep properly and did not push patient very hard to drink it in a timely manner despite having all day yesterday no prep was given during the day and she is still not adequately prepped and she has no new complaints and we will try again for tomorrow  Objective: Vital signs stable afebrile no acute distress abdomen is soft nontender hemoglobin stable BUN and creatinine stable probably needs another unit of blood  Assessment: Multiple medical problems including GI blood loss  Plan: Will plan colonoscopy tomorrow at 9:30 in orders written and hopefully we can get her clean enough to proceed  [facility]  Pager [phone] After 5PM or if no answer call [phone]

## 2018-03-11 NOTE — Progress Notes (Signed)
Patient rpt H and H came back as HB of 5.6  Will give 2 unit PRBC Will monitor closely Nursing informed to let GI know as ongoing drop in [facility]

## 2018-03-12 ENCOUNTER — Inpatient Hospital Stay (HOSPITAL_COMMUNITY): Payer: Medicare Other | Admitting: Anesthesiology

## 2018-03-12 ENCOUNTER — Encounter (HOSPITAL_COMMUNITY): Payer: Self-pay

## 2018-03-12 ENCOUNTER — Encounter (HOSPITAL_COMMUNITY)
Admission: EM | Disposition: A | Discharge status: Home Health | Payer: Self-pay | Source: Home / Self Care | Attending: Internal Medicine

## 2018-03-12 HISTORY — PX: COLONOSCOPY WITH PROPOFOL: SHX5780

## 2018-03-12 LAB — BASIC METABOLIC PANEL
Anion gap: 8 (ref 5–15)
BUN: 31 mg/dL — AB (ref 6–20)
CHLORIDE: 104 mmol/L (ref 101–111)
CO2: 23 mmol/L (ref 22–32)
CREATININE: 1.65 mg/dL — AB (ref 0.44–1.00)
Calcium: 7.7 mg/dL — ABNORMAL LOW (ref 8.9–10.3)
GFR calc non Af Amer: 28 mL/min — ABNORMAL LOW (ref >60)
GFR, EST AFRICAN AMERICAN: 33 mL/min — AB (ref >60)
GLUCOSE: 74 mg/dL (ref 65–99)
Potassium: 3.3 mmol/L — ABNORMAL LOW (ref 3.5–5.1)
Sodium: 135 mmol/L (ref 135–145)

## 2018-03-12 LAB — TYPE AND SCREEN
ABO/RH(D): A POS
ANTIBODY SCREEN: NEGATIVE
Unit division: 0
Unit division: 0
Unit division: 0

## 2018-03-12 LAB — BPAM RBC
BLOOD PRODUCT EXPIRATION DATE: 201905182359
Blood Product Expiration Date: 201905092359
Blood Product Expiration Date: 201905192359
ISSUE DATE / TIME: 201904240209
ISSUE DATE / TIME: 201904241836
ISSUE DATE / TIME: 201904242229
Unit Type and Rh: 6200
Unit Type and Rh: 6200
Unit Type and Rh: 6200

## 2018-03-12 LAB — CBC WITH DIFFERENTIAL/PLATELET
BASOS ABS: 0 10*3/uL (ref 0.0–0.1)
BASOS ABS: 0 10*3/uL (ref 0.0–0.1)
BASOS ABS: 0 10*3/uL (ref 0.0–0.1)
Basophils Absolute: 0 10*3/uL (ref 0.0–0.1)
Basophils Relative: 0 %
Basophils Relative: 0 %
Basophils Relative: 0 %
Basophils Relative: 1 %
EOS ABS: 0.3 10*3/uL (ref 0.0–0.7)
EOS ABS: 0.3 10*3/uL (ref 0.0–0.7)
EOS PCT: 3 %
EOS PCT: 4 %
EOS PCT: 4 %
Eosinophils Absolute: 0.2 10*3/uL (ref 0.0–0.7)
Eosinophils Absolute: 0.4 10*3/uL (ref 0.0–0.7)
Eosinophils Relative: 2 %
HCT: 25.2 % — ABNORMAL LOW (ref 36.0–46.0)
HCT: 27.8 % — ABNORMAL LOW (ref 36.0–46.0)
HCT: 25.9 % — ABNORMAL LOW (ref 36.0–46.0)
HEMATOCRIT: 24.1 % — AB (ref 36.0–46.0)
Hemoglobin: 8.1 g/dL — ABNORMAL LOW (ref 12.0–15.0)
Hemoglobin: 8.6 g/dL — ABNORMAL LOW (ref 12.0–15.0)
Hemoglobin: 8.6 g/dL — ABNORMAL LOW (ref 12.0–15.0)
Hemoglobin: 9.3 g/dL — ABNORMAL LOW (ref 12.0–15.0)
LYMPHS ABS: 2.7 10*3/uL (ref 0.7–4.0)
LYMPHS PCT: 23 %
LYMPHS PCT: 23 %
Lymphocytes Relative: 30 %
Lymphocytes Relative: 23 %
Lymphs Abs: 1.9 10*3/uL (ref 0.7–4.0)
Lymphs Abs: 2 10*3/uL (ref 0.7–4.0)
Lymphs Abs: 2.3 10*3/uL (ref 0.7–4.0)
MCH: 30.1 pg (ref 26.0–34.0)
MCH: 30.1 pg (ref 26.0–34.0)
MCH: 30.2 pg (ref 26.0–34.0)
MCH: 30.5 pg (ref 26.0–34.0)
MCHC: 33.2 g/dL (ref 30.0–36.0)
MCHC: 33.5 g/dL (ref 30.0–36.0)
MCHC: 33.6 g/dL (ref 30.0–36.0)
MCHC: 34.1 g/dL (ref 30.0–36.0)
MCV: 89.4 fL (ref 78.0–100.0)
MCV: 89.6 fL (ref 78.0–100.0)
MCV: 90 fL (ref 78.0–100.0)
MCV: 90.9 fL (ref 78.0–100.0)
MONOS PCT: 9 %
MONOS PCT: 9 %
Monocytes Absolute: 0.7 10*3/uL (ref 0.1–1.0)
Monocytes Absolute: 0.8 10*3/uL (ref 0.1–1.0)
Monocytes Absolute: 0.9 10*3/uL (ref 0.1–1.0)
Monocytes Absolute: 0.9 10*3/uL (ref 0.1–1.0)
Monocytes Relative: 10 %
Monocytes Relative: 9 %
NEUTROS ABS: 5.3 10*3/uL (ref 1.7–7.7)
NEUTROS ABS: 6.7 10*3/uL (ref 1.7–7.7)
Neutro Abs: 5.3 10*3/uL (ref 1.7–7.7)
Neutro Abs: 5.3 10*3/uL (ref 1.7–7.7)
Neutrophils Relative %: 58 %
Neutrophils Relative %: 63 %
Neutrophils Relative %: 63 %
Neutrophils Relative %: 66 %
PLATELETS: 137 10*3/uL — AB (ref 150–400)
PLATELETS: 146 10*3/uL — AB (ref 150–400)
PLATELETS: 152 10*3/uL (ref 150–400)
Platelets: 151 10*3/uL (ref 150–400)
RBC: 2.69 MIL/uL — ABNORMAL LOW (ref 3.87–5.11)
RBC: 2.82 MIL/uL — ABNORMAL LOW (ref 3.87–5.11)
RBC: 2.85 MIL/uL — AB (ref 3.87–5.11)
RBC: 3.09 MIL/uL — AB (ref 3.87–5.11)
RDW: 15.2 % (ref 11.5–15.5)
RDW: 15.3 % (ref 11.5–15.5)
RDW: 15.5 % (ref 11.5–15.5)
RDW: 15.7 % — ABNORMAL HIGH (ref 11.5–15.5)
WBC: 10 10*3/uL (ref 4.0–10.5)
WBC: 8.3 10*3/uL (ref 4.0–10.5)
WBC: 8.4 10*3/uL (ref 4.0–10.5)
WBC: 9.1 10*3/uL (ref 4.0–10.5)

## 2018-03-12 LAB — GLUCOSE, CAPILLARY
Glucose-Capillary: 77 mg/dL (ref 65–99)
Glucose-Capillary: 80 mg/dL (ref 65–99)
Glucose-Capillary: 78 mg/dL (ref 65–99)
Glucose-Capillary: 103 mg/dL — ABNORMAL HIGH (ref 65–99)

## 2018-03-12 SURGERY — COLONOSCOPY WITH PROPOFOL
Anesthesia: Monitor Anesthesia Care

## 2018-03-12 MED ORDER — PROPOFOL 500 MG/50ML IV EMUL
INTRAVENOUS | Status: DC | PRN
  Administered 2018-03-12: 100 ug/kg/min via INTRAVENOUS

## 2018-03-12 MED ORDER — POTASSIUM CHLORIDE CRYS ER 20 MEQ PO TBCR
40.0000 meq | EXTENDED_RELEASE_TABLET | Freq: Once | ORAL | Status: AC
  Administered 2018-03-12: 40 meq via ORAL
  Filled 2018-03-12: qty 2

## 2018-03-12 MED ORDER — ONDANSETRON HCL 4 MG/2ML IJ SOLN
INTRAMUSCULAR | Status: DC | PRN
  Administered 2018-03-12: 4 mg via INTRAVENOUS

## 2018-03-12 MED ORDER — SODIUM CHLORIDE 0.9 % IV BOLUS
500.0000 mL | Freq: Once | INTRAVENOUS | Status: AC
  Administered 2018-03-12: 500 mL via INTRAVENOUS

## 2018-03-12 SURGICAL SUPPLY — 22 items

## 2018-03-12 NOTE — Progress Notes (Signed)
MD Panchal notified, received order to bolus pt w/ 539ml NaCl. RN will continue to monitor.   03/12/18 0131  Vitals  BP (!) 105/38  MAP (mmHg) (!) 59  ECG Heart Rate 65  Resp (!) 8  Oxygen Therapy  SpO2 98 %

## 2018-03-12 NOTE — Anesthesia Postprocedure Evaluation (Signed)
Anesthesia Post Note  Patient: Shelby Reese  Procedure(s) Performed: COLONOSCOPY WITH PROPOFOL (N/A )     Patient location during evaluation: Endoscopy Anesthesia Type: MAC Level of consciousness: awake and alert Pain management: pain level controlled Vital Signs Assessment: post-procedure vital signs reviewed and stable Respiratory status: spontaneous breathing, nonlabored ventilation, respiratory function stable and patient connected to nasal cannula oxygen Cardiovascular status: blood pressure returned to baseline and stable Postop Assessment: no apparent nausea or vomiting Anesthetic complications: no    Last Vitals:  Vitals:   03/12/18 0841 03/12/18 1003  BP: (!) 133/31 (!) 127/41  Pulse: 67 70  Resp: (!) 8 17  Temp: 36.9 C (!) 36.4 C  SpO2: 96% 99%    Last Pain:  Vitals:   03/12/18 1003  TempSrc: Oral  PainSc: 0-No pain                 Rea Kalama DANIEL

## 2018-03-12 NOTE — Progress Notes (Signed)
[facility] ADULT ICU REPLACEMENT PROTOCOL FOR AM LAB REPLACEMENT ONLY  The patient does not apply for the [facility] Adult ICU Electrolyte Replacment Protocol based on the criteria listed below:   Is GFR >/= 40 ml/min? No.  Patient's GFR today is 28   Abnormal electrolyte(s): K3.3  If a panic level lab has been reported, has the CCM MD in charge been notified? Yes.  .   Physician:  Michelle Piper MD  [facility] 03/12/2018 5:42 AM

## 2018-03-12 NOTE — Op Note (Signed)
[facility] Patient Name: Shelby Reese Procedure Date : 03/12/2018 MRN: [id number] Attending MD: Clarene Essex , MD Date of Birth: [birth year] CSN: [id number] Age: 83 Admit Type: Inpatient Procedure:                Colonoscopy Indications:              Melena, Acute post hemorrhagic anemia Providers:                Clarene Essex, MD, Cleda Daub, RN, Laurena Spies, Technician, Eligha Bridegroom CRNA, CRNA Referring MD:              Medicines:                Propofol total dose 378 mg IV Complications:            No immediate complications. Estimated Blood Loss:     Estimated blood loss: none. Procedure:                Pre-Anesthesia Assessment:                           - Prior to the procedure, a History and Physical                            was performed, and patient medications and                            allergies were reviewed. The patient's tolerance of                            previous anesthesia was also reviewed. The risks                            and benefits of the procedure and the sedation                            options and risks were discussed with the patient.                            All questions were answered, and informed consent                            was obtained. Prior Anticoagulants: The patient has                            taken Plavix (clopidogrel), last dose was 3 days                            prior to procedure. ASA Grade Assessment: III - A                            patient with severe systemic disease. After  reviewing the risks and benefits, the patient was                            deemed in satisfactory condition to undergo the                            procedure.                           After obtaining informed consent, the colonoscope                            was passed under direct vision. Throughout the                            procedure, the patient's blood  pressure, pulse, and                            oxygen saturations were monitored continuously. The                            EC-[id number] ([id number]) scope was introduced through                            the anus and advanced to the the ileocecal valve.                            The ileocecal valve and the rectum were                            photographed. The colonoscopy was somewhat                            difficult due to unsatisfactory bowel prep,                            significant looping, a tortuous colon and the                            patient's body habitus. Successful completion of                            the procedure was aided by applying abdominal                            pressure and trying to rule her on her back. at                            times we could see what we thought was part of the                            cecum but despite multiple abdominal pressures  could not advance deep into the cecum The patient                            tolerated the procedure fairly well. The quality of                            the bowel preparation was fair. Scope In: 9:18:50 AM Scope Out: 9:48:01 AM Scope Withdrawal Time: 0 hours 0 minutes 46 seconds  Total Procedure Duration: 0 hours 29 minutes 11 seconds  Findings:      External and internal hemorrhoids were found during retroflexion, during       perianal exam and during digital exam. The hemorrhoids were small.      The entire examined colon appeared normal. there was no active bleeding       seen and no at risk lesions although the prep particularly and both       flexures was not good and no blood was seen coming from above Impression:               - Preparation of the colon was fair. - External and                            internal hemorrhoids.                           - The entire examined colon is normal.                           - No specimens collected. Recommendation:            - Patient has a contact number available for                            emergencies. The signs and symptoms of potential                            delayed complications were discussed with the                            patient. Return to normal activities tomorrow.                            Written discharge instructions were provided to the                            patient.                           - Soft diet today. would use regular scope in the                            future if repeat colonoscopy is needed                           - Return to GI clinic PRN. consider resuming blood  thinners and use some minimal amount that vascular                            surgery feels comfortable with                           - Telephone GI clinic if symptomatic PRN. capsule                            endoscopy next if signs of continual bleeding or if                            required prior to resuming blood thinners Procedure Code(s):        --- Professional ---                           [phone], Colonoscopy, flexible; diagnostic, including                            collection of specimen(s) by brushing or washing,                            when performed (separate procedure) Diagnosis Code(s):        --- Professional ---                           K64.8, Other hemorrhoids                           K92.1, Melena (includes Hematochezia)                           D62, Acute posthemorrhagic anemia CPT copyright 2017 American [employer] Association. All rights reserved. The codes documented in this report are preliminary and upon coder review may  be revised to meet current compliance requirements. Clarene Essex, MD 03/12/2018 10:00:39 AM This report has been signed electronically. Number of Addenda: 0

## 2018-03-12 NOTE — Plan of Care (Signed)
  Problem: Clinical Measurements: Goal: Will remain free from infection Outcome: Progressing Goal: Diagnostic test results will improve Outcome: Progressing Goal: Respiratory complications will improve Outcome: Progressing Goal: Cardiovascular complication will be avoided Outcome: Progressing   Problem: Activity: Goal: Risk for activity intolerance will decrease Outcome: Progressing   Problem: Nutrition: Goal: Adequate nutrition will be maintained Outcome: Progressing   Problem: Coping: Goal: Level of anxiety will decrease Outcome: Progressing   Problem: Pain Managment: Goal: General experience of comfort will improve Outcome: Progressing   Problem: Safety: Goal: Ability to remain free from injury will improve Outcome: Progressing   Problem: Spiritual Needs Goal: Ability to function at adequate level Outcome: Progressing   Problem: Health Behavior/Discharge Planning: Goal: Ability to manage health-related needs will improve Outcome: Not Progressing   Problem: Clinical Measurements: Goal: Ability to maintain clinical measurements within normal limits will improve Outcome: Not Progressing   Problem: Skin Integrity: Goal: Risk for impaired skin integrity will decrease Outcome: Not Progressing

## 2018-03-12 NOTE — Transfer of Care (Signed)
Immediate Anesthesia Transfer of Care Note  Patient: Shelby Reese  Procedure(s) Performed: COLONOSCOPY WITH PROPOFOL (N/A )  Patient Location: PACU and Endoscopy Unit  Anesthesia Type:MAC  Level of Consciousness: awake and alert   Airway & Oxygen Therapy: Patient Spontanous Breathing and Patient connected to nasal cannula oxygen  Post-op Assessment: Report given to RN and Post -op Vital signs reviewed and stable  Post vital signs: Reviewed and stable  Last Vitals:  Vitals Value Taken Time  BP    Temp    Pulse    Resp    SpO2      Last Pain:  Vitals:   03/12/18 0841  TempSrc: Oral  PainSc: 0-No pain      Patients Stated Pain Goal: 4 (82/42/35 [id number])  Complications: No apparent anesthesia complications

## 2018-03-12 NOTE — Anesthesia Preprocedure Evaluation (Signed)
Anesthesia Evaluation  Patient identified by MRN, date of birth, ID band Patient awake    Reviewed: Allergy & Precautions, H&P , NPO status , Patient's Chart, lab work & pertinent test results  History of Anesthesia Complications Negative for: history of anesthetic complications  Airway Mallampati: II  TM Distance: >3 FB Neck ROM: Full    Dental  (+) Teeth Intact, Dental Advisory Given   Pulmonary former smoker,    breath sounds clear to auscultation       Cardiovascular hypertension, Pt. on medications + Peripheral Vascular Disease  + Valvular Problems/Murmurs AS and AI  Rhythm:Regular Rate:Normal  '19 TTE - EF of 60% to 65%. Grade 2 diastolic   dysfunction. Moderate AS, moderate AI. Trivial MR. Left atrium was severely dilated. Mild TR. PASP was moderately increased. PA peak pressure: 49 mm Hg   Neuro/Psych PSYCHIATRIC DISORDERS Depression negative neurological ROS     GI/Hepatic Neg liver ROS, GERD  Medicated and Controlled,  Endo/Other  Hypothyroidism Obesity  Renal/GU CRF and Renal InsufficiencyRenal disease  Female GU complaint     Musculoskeletal  (+) Arthritis , Osteoarthritis,  Fibromyalgia -  Abdominal (+) + obese,   Peds  Hematology  (+) anemia ,   Anesthesia Other Findings   Reproductive/Obstetrics negative OB ROS                             Anesthesia Physical  Anesthesia Plan  ASA: IV  Anesthesia Plan: MAC   Post-op Pain Management:    Induction:   PONV Risk Score and Plan: Propofol infusion and Treatment may vary due to age or medical condition  Airway Management Planned: Natural Airway  Additional Equipment: None  Intra-op Plan:   Post-operative Plan:   Informed Consent: I have reviewed the patients History and Physical, chart, labs and discussed the procedure including the risks, benefits and alternatives for the proposed anesthesia with the patient or  authorized representative who has indicated his/her understanding and acceptance.   Dental advisory given  Plan Discussed with: CRNA and Anesthesiologist  Anesthesia Plan Comments:         Anesthesia Quick Evaluation

## 2018-03-12 NOTE — Progress Notes (Signed)
   03/12/18 0345  Vitals  BP (!) 124/43  MAP (mmHg) 66  ECG Heart Rate 64  Resp 10  Oxygen Therapy  SpO2 99 %

## 2018-03-12 NOTE — Anesthesia Procedure Notes (Signed)
Procedure Name: MAC Date/Time: 03/12/2018 9:15 AM Performed by: Eligha Bridegroom, CRNA Pre-anesthesia Checklist: Patient identified, Emergency Drugs available, Suction available, Patient being monitored and Timeout performed Patient Re-evaluated:Patient Re-evaluated prior to induction Oxygen Delivery Method: Nasal cannula Preoxygenation: Pre-oxygenation with 100% oxygen

## 2018-03-12 NOTE — Progress Notes (Signed)
Shelby Reese 8:48 AM  Subjective: Patient still with a little bleeding and we believe she's finally prepped and has no new complaints  Objective: Vital signs stable afebrile no acute distress exam please see preassessment evaluationlabs improved Assessment: GI blood loss on aspirin and Plavix status post stent  Plan: Okay to proceed with colonoscopy with anesthesia assistance  [facility]  Pager [phone] After 5PM or if no answer call [phone]

## 2018-03-12 NOTE — Progress Notes (Signed)
PULMONARY / CRITICAL CARE MEDICINE   Name: Shelby Reese MRN: [id number] DOB: 04-29-1935    ADMISSION DATE:  02/23/2018 CONSULTATION DATE:  03/09/2018  REFERRING MD:  Cline Cools CHIEF COMPLAINT:  Melena and hypotension.  HISTORY OF PRESENT ILLNESS:   This is an 83 year old with severe peripheral vascular disease who was initially admitted on 4/8 due to a nonhealing ulcer of the left foot.  She eventually underwent CO2 angiography with subsequent CFA arthrectomy and stent placement on 4/16.  She was placed on dual antiplatelet therapy at that time.  She was already noted to be anemic and guaiac positive.  I was asked to see the patient this afternoon due to the development of melena and hypotension.  She is received only a fraction of a liter of normal saline and packed red blood cells and transferred to the intensive care unit and is now normotensive and in part entirely appropriate and interactive.  She has no complaint of abdominal pain.  She denies a history of prior known significant GI bleeding.  She denies a history of liver disease.  She has not had a drink in many years.  She denies use of nonsteroidals at home.  Her dual antiplatelet therapy has been discontinued and she has been placed on a Protonix infusion.  S/P vascuar stent off AC due to bleeding   SUBJECTIVE:  Bedside EGD  without evidence of bleeding Urinary retention with intermittent cath Colonoscopy planned today < was not done yesterday due to poor prep> Complains of diarrhoea possbile due to bowe prep Off levophed some soft BP at night received some fluids but doing well in am Hgb dropped to 5.6 yesterday received 2 unit PRBC and today it is 8.6 Patient without complaints  CR improving   VITAL SIGNS: BP (!) 122/38   Pulse 80   Temp (!) 97.3 F (36.3 C) (Oral)   Resp 15   Ht 5\' 3"  (1.6 m)   Wt 88.2 kg (194 lb 7.1 oz)   SpO2 95%   BMI 34.44 kg/m   INTAKE / OUTPUT: I/O last 3 completed shifts: In: 5078.8  [P.O.:1912; I.V.:1871.8; Blood:695; IV [id number]:962] Out: 2297 [LGXQJ:1941; Stool:5]  PHYSICAL EXAMINATION: General:  Chronically ill appearing elderly female  HEENT: MM pale/moist, Pupils reactive Neuro: Alert and oriented, MAE CV: rrr, no m/r/g PULM: even/non-labored, lungs bilaterally clear anteriorly, minimal rales in R base GI: obese, soft, non-tender, bs active  Extremities: warm/dry, LLE venous stasis with dressing intact, R AKA with some swelling at stump with mild tenderness, no erythema Skin: scattered bruising   LABS:  BMET Recent Labs  Lab 03/10/18 0315 03/11/18 0624 03/12/18 0418  NA 135 135 135  K 4.7 4.6 3.3*  CL 104 103 104  CO2 22 20* 23  BUN 34* 39* 31*  CREATININE 2.11* 2.09* 1.65*  GLUCOSE 223* 121* 74    Electrolytes Recent Labs  Lab 03/10/18 0315 03/11/18 0624 03/12/18 0418  CALCIUM 7.6* 7.6* 7.7*  MG  --  1.6*  --   PHOS  --  5.4*  --     CBC Recent Labs  Lab 03/11/18 1705 03/11/18 2350 03/12/18 0418  WBC 9.1 10.0 9.1  HGB 5.6* 8.1* 8.6*  HCT 17.3* 24.1* 25.2*  PLT 137* 151 137*    Coag's Recent Labs  Lab 03/10/18 0315  INR 1.31    Sepsis Markers Recent Labs  Lab 03/10/18 1455 03/10/18 1456 03/11/18 0624  LATICACIDVEN 1.4  --   --   PROCALCITON  --  1.01 0.56    ABG No results for input(s): PHART, PCO2ART, PO2ART in the last 168 hours.  Liver Enzymes Recent Labs  Lab 03/11/18 0624  ALBUMIN 2.4*    Cardiac Enzymes No results for input(s): TROPONINI, PROBNP in the last 168 hours.  Glucose Recent Labs  Lab 03/11/18 1136 03/11/18 1625 03/11/18 1935 03/11/18 2331 03/12/18 0353 03/12/18 0737  GLUCAP 109* 127* 118* 97 77 80    Imaging No results found. 4/23 EGD >> Normal-appearing esophagus. Regular Z line. Pedunculated polyp in gastric cardia, nonbleeding, not removed( patient had received aspirin and Plavix today morning), appeared hyperplastic. There was no evidence of recent or active bleeding  noted in the entire gastric cavity. Retroflexion was otherwise unremarkable. Antrum and pyloric channel appeared unremarkable. Additional bulb and rest of the duodenum appeared unremarkable.  CULTURES: 4/8 UC >> > 100,000 colonies providencia rettgeri  4/09 BC x 2 >> neg 4/22 MRSA PCR >> neg 4/23 BC x 2 >>  ANTIBIOTICS: 4/8 Clindamycin >> 4/17 4/9  Ceftriaxone >> 4/18  SIGNIFICANT EVENTS: 4/8 Admit  4/22 tx to ICU 4/22 EGD 4/24 2 unit PRBC due to drop in HB  LINES/TUBES: 4/22 L SVC TL >>  4/22 L R art line >> removed on 4.24 4/23 foley >>  ASSESSMENT / PLAN:  PULMONARY A: No acute issues - CXR 4/22 with slight improvement in interstitial prominence, left base scaring - no respiratory symptoms P:   Supplemental O2 prn  Pulm hygiene/ mobilize when able   CARDIOVASCULAR A:  Shock - hypovolemic vs hemorraghic  PAD with R AKA, HTN - Off pressors    P:  Tele monitoringx On solu-cortef 50 mg q 6 -- Cortisol level is 44 will dc that as it might cause gi bleed it self Consider reassessing TTE (last 11/2017 with EF 60%)- if continue to remain hypotensive Hold asa and plavix with acute GIB Hold cardizem  Continue lipitor  RENAL A:   AKI Urinary retention P:   Continue foley for now if continue to progress well OOB to cummode and give voiding trial Trend BMP / mag/ phos/ daily wts/  urinary output Replace electrolytes as indicated  GASTROINTESTINAL A:   Suspected GIB - melena  - Sp 2 unit PRBC on 4/24 P:   GI following  EGD normal 4/22 Colonoscopy scheduled for 4/25 PPI q 12  Hold heparin SQ  HEMATOLOGIC A:   IDA  ABLA- GIB P:  Continue ferrous sulfate Trend CBC q 6 Hold heparin for now and monitor  INFECTIOUS A:   Leukocyotisis  - remains afebrile Ischemic ulceration of left foot- S/P stent Vascular is following.  Prior UTI- providencia rettgeri- Treated with antibiotics   P:   Repeat BCx 2 -- Negative so far Repeat UA is pending per notes   PCT ok Trend WBC/ fever curve   ENDOCRINE A:   Hyperglycemia hypothyroidism P:   Cont SSI Continue synthroid DC Solucortef as cortisol level was 44 <4/24>  NEUROLOGIC A:   Chronic pain/ depression P:   Monitor Minimize sedation, change oxy to prn, d/c dilaudid, fentanyl patch, and Ambien Continue lexapro   FAMILY  - Updates: Patient updated at bedside.   - Inter-disciplinary family meet or Palliative Care meeting due by: 4/30   Skin/Wound: Left leg dressing   Electrolytes: Replace electrolytes per ICU electrolyte replacement protocol.   IVF: none  Nutrition: NPO for procedure  Prophylaxis: DVT Prophylaxis with scd,. GI Prophylaxis.   Restraints: None  PT/OT eval and treat.  OOB when appropriate.   Lines/Tubes: per notes on 4/23 foley  4/22 left sub clavian central line.  ADVANCE DIRECTIVE:full code  FAMILY DISCUSSION:Spoke with patient   Quality Care: PPI, DVT prophylaxis, HOB elevated, Infection control all reviewed and addressed.  Events and notes from last 24 hours reviewed. Care plan discussed on multidisciplinary rounds  CC TIME:31 min   Old records reviewed discussed results and management plan with patient  Images personally reviewed and results and labs reviewed and discussed with patient.  All medication reviewed and adjusted  Further management depending on test results and work up as outlined above.    Lahoma Rocker, M.D

## 2018-03-12 NOTE — Progress Notes (Signed)
Patient's stools are Type 7-liquid consistency as of 2200 03/11/2018. Patient has had two more bloody loose Type 7 stools since then. Consent is signed for scheduled colonoscopy this AM. Second unit of blood finished at [place]. 11pm CBC showed hgb=8.1. RN will repeat labs at 0500.  RN will keep patient NPO after midnight and continue to monitor patient throughout the night.

## 2018-03-13 ENCOUNTER — Inpatient Hospital Stay (HOSPITAL_COMMUNITY): Payer: Medicare Other

## 2018-03-13 ENCOUNTER — Encounter (HOSPITAL_COMMUNITY): Payer: Self-pay | Admitting: Gastroenterology

## 2018-03-13 LAB — GLUCOSE, CAPILLARY
Glucose-Capillary: 104 mg/dL — ABNORMAL HIGH (ref 65–99)
Glucose-Capillary: 108 mg/dL — ABNORMAL HIGH (ref 65–99)
Glucose-Capillary: 111 mg/dL — ABNORMAL HIGH (ref 65–99)
Glucose-Capillary: 116 mg/dL — ABNORMAL HIGH (ref 65–99)
Glucose-Capillary: 124 mg/dL — ABNORMAL HIGH (ref 65–99)
Glucose-Capillary: 140 mg/dL — ABNORMAL HIGH (ref 65–99)
Glucose-Capillary: 91 mg/dL (ref 65–99)
Glucose-Capillary: 94 mg/dL (ref 65–99)

## 2018-03-13 LAB — CBC WITH DIFFERENTIAL/PLATELET
BASOS ABS: 0 10*3/uL (ref 0.0–0.1)
BASOS ABS: 0 10*3/uL (ref 0.0–0.1)
BASOS PCT: 0 %
Basophils Relative: 0 %
EOS PCT: 3 %
Eosinophils Absolute: 0.3 10*3/uL (ref 0.0–0.7)
Eosinophils Absolute: 0.4 10*3/uL (ref 0.0–0.7)
Eosinophils Relative: 4 %
HCT: 26.4 % — ABNORMAL LOW (ref 36.0–46.0)
HCT: 26.8 % — ABNORMAL LOW (ref 36.0–46.0)
Hemoglobin: 8.6 g/dL — ABNORMAL LOW (ref 12.0–15.0)
Hemoglobin: 8.7 g/dL — ABNORMAL LOW (ref 12.0–15.0)
LYMPHS PCT: 20 %
LYMPHS PCT: 23 %
Lymphs Abs: 2 10*3/uL (ref 0.7–4.0)
Lymphs Abs: 2.2 10*3/uL (ref 0.7–4.0)
MCH: 30 pg (ref 26.0–34.0)
MCH: 30 pg (ref 26.0–34.0)
MCHC: 32.5 g/dL (ref 30.0–36.0)
MCHC: 32.6 g/dL (ref 30.0–36.0)
MCV: 92 fL (ref 78.0–100.0)
MCV: 92.4 fL (ref 78.0–100.0)
MONO ABS: 0.8 10*3/uL (ref 0.1–1.0)
MONO ABS: 0.9 10*3/uL (ref 0.1–1.0)
MONOS PCT: 8 %
Monocytes Relative: 9 %
NEUTROS ABS: 6.6 10*3/uL (ref 1.7–7.7)
Neutro Abs: 6.3 10*3/uL (ref 1.7–7.7)
Neutrophils Relative %: 66 %
Neutrophils Relative %: 67 %
PLATELETS: 173 10*3/uL (ref 150–400)
Platelets: 175 10*3/uL (ref 150–400)
RBC: 2.87 MIL/uL — AB (ref 3.87–5.11)
RBC: 2.9 MIL/uL — ABNORMAL LOW (ref 3.87–5.11)
RDW: 15.6 % — AB (ref 11.5–15.5)
RDW: 15.8 % — AB (ref 11.5–15.5)
WBC: 9.7 10*3/uL (ref 4.0–10.5)
WBC: 9.8 10*3/uL (ref 4.0–10.5)

## 2018-03-13 LAB — COMPREHENSIVE METABOLIC PANEL
ALT: 16 U/L (ref 14–54)
AST: 18 U/L (ref 15–41)
Albumin: 2.5 g/dL — ABNORMAL LOW (ref 3.5–5.0)
Alkaline Phosphatase: 33 U/L — ABNORMAL LOW (ref 38–126)
Anion gap: 7 (ref 5–15)
BILIRUBIN TOTAL: 0.6 mg/dL (ref 0.3–1.2)
BUN: 27 mg/dL — AB (ref 6–20)
CHLORIDE: 107 mmol/L (ref 101–111)
CO2: 23 mmol/L (ref 22–32)
Calcium: 8 mg/dL — ABNORMAL LOW (ref 8.9–10.3)
Creatinine, Ser: 1.48 mg/dL — ABNORMAL HIGH (ref 0.44–1.00)
GFR calc Af Amer: 37 mL/min — ABNORMAL LOW (ref >60)
GFR, EST NON AFRICAN AMERICAN: 32 mL/min — AB (ref >60)
Glucose, Bld: 94 mg/dL (ref 65–99)
Potassium: 4 mmol/L (ref 3.5–5.1)
Sodium: 137 mmol/L (ref 135–145)
TOTAL PROTEIN: 4.7 g/dL — AB (ref 6.5–8.1)

## 2018-03-13 MED ORDER — BENAZEPRIL HCL 40 MG PO TABS
40.0000 mg | ORAL_TABLET | Freq: Every day | ORAL | Status: DC
  Administered 2018-03-13 – 2018-03-18 (×6): 40 mg via ORAL
  Filled 2018-03-13 (×6): qty 1

## 2018-03-13 MED ORDER — HYDRALAZINE HCL 50 MG PO TABS
50.0000 mg | ORAL_TABLET | Freq: Once | ORAL | Status: AC
Start: 2018-03-13 — End: 2018-03-13
  Administered 2018-03-13: 50 mg via ORAL
  Filled 2018-03-13: qty 1

## 2018-03-13 MED ORDER — HYDRALAZINE HCL 50 MG PO TABS
50.0000 mg | ORAL_TABLET | Freq: Four times a day (QID) | ORAL | Status: DC
  Administered 2018-03-13 – 2018-03-14 (×6): 50 mg via ORAL
  Filled 2018-03-13 (×6): qty 1

## 2018-03-13 NOTE — Progress Notes (Signed)
[facility] Gastroenterology Progress Note  CONNELLY SPRUELL 83 y.o. 13-Nov-1935  CC:  GI bleed   Subjective:  Patient feeling better. Denied abdominal pain, nausea vomiting. No further bleeding episodes. Discussed with nursing staff. Denied any acute events. Patient may get transferred out of the ICU today.  ROS : negative for chest pain. Negative for respiratory distress.   Objective: Vital signs in last 24 hours: Vitals:   03/13/18 0700 03/13/18 0735  BP: (!) 158/51   Pulse:    Resp: 14   Temp:  98.4 F (36.9 C)  SpO2: 98%     Physical Exam:  GEN : A/O X 3 , NAD Heart : RRR ABD: soft, nondistended, nontender, bowel sounds present. No peritoneal signs. LE : RAKA, dressing over left lower extremity   Lab Results: Recent Labs    03/11/18 0624 03/12/18 0418 03/13/18 0400  NA 135 135 137  K 4.6 3.3* 4.0  CL 103 104 107  CO2 20* 23 23  GLUCOSE 121* 74 94  BUN 39* 31* 27*  CREATININE 2.09* 1.65* 1.48*  CALCIUM 7.6* 7.7* 8.0*  MG 1.6*  --   --   PHOS 5.4*  --   --    Recent Labs    03/11/18 0624 03/13/18 0400  AST  --  18  ALT  --  16  ALKPHOS  --  33*  BILITOT  --  0.6  PROT  --  4.7*  ALBUMIN 2.4* 2.5*   Recent Labs    03/12/18 2333 03/13/18 0400  WBC 9.8 9.7  NEUTROABS 6.6 6.3  HGB 8.6* 8.7*  HCT 26.4* 26.8*  MCV 92.0 92.4  PLT 175 173   No results for input(s): LABPROT, INR in the last 72 hours.    Assessment/Plan: - GI bleed. EGD an colonoscopy negative for evidence of active bleeding. Hemoglobin stable since yesterday. - Acute blood loss anemia.resolved. - Hemorrhagic shock.resolved - ischemic ulceration of left foot. s/p left CFA atherectomy and PTA, SFA atherectomy and stent  Placement on 03/03/2018     Recommendations -------------------------- - patient's hemoglobin is stable. No further bleeding episodes. - Patient in need of antiplatelet therapy for recent stent placement after SFA atherectomy. I think it is reasonable to resume single  antiplatelet agent either aspirin or Plavix while carefully monitoring for evidence of GI bleed and  Hemoglobin - GI will follow  Otis Brace MD, [place] 03/13/2018, 9:10 AM  Contact #  [phone]

## 2018-03-13 NOTE — Progress Notes (Signed)
PULMONARY / CRITICAL CARE MEDICINE   Name: Shelby Reese MRN: [id number] DOB: 31-Mar-1935    ADMISSION DATE:  02/23/2018 CONSULTATION DATE:  03/09/2018  REFERRING MD: Cline Cools  CHIEF COMPLAINT:  Melena with hypotension  HISTORY OF PRESENT ILLNESS:   This is an 83 year old with severe peripheral vascular disease was initially admitted on 4/8 due to a nonhealing ulcer of the left foot.  She eventually underwent CO2 angiography with subsequent CFA arthrectomy and stent placement on 4/16.  She was placed on dual antiplatelet therapy at that time but was already noted to be anemic and guaiac positive.  She was seen by CCM for the development of melena and hypotension and was transferred to the intensive care unit .  She had no complaint of abdominal pain and denied a history of prior known significant GI bleeding.  She denied a history of liver disease and stated that she has not had a drink in many years.  She denied use of nonsteroidals at home.  Her dual antiplatelet therapy was discontinued and she was placed on a Protonix infusion.  PAST MEDICAL HISTORY :  She  has a past medical history of Allergy, Anemia, CKD (chronic kidney disease), stage II, Fibromyalgia, GERD (gastroesophageal reflux disease), Hiatal hernia, Hypertension, Hypothyroid, Lymphedema, Osteoarthritis, Peripheral vascular disease ([place]), Ulcer of knee ([place]), Urinary incontinence, Varicose veins, and Venous insufficiency.  PAST SURGICAL HISTORY: She  has a past surgical history that includes Tonsillectomy; Knee arthroscopy (Left); Colonoscopy; Cataract extraction w/ intraocular lens  implant, bilateral; Multiple tooth extractions; I&D extremity (Right, 07/18/2017); Application if wound vac (Right, 07/18/2017); I&D extremity (Right, 07/23/2017); Skin split graft (Right, 07/25/2017); I&D extremity (Right, 11/28/2017); I&D extremity (Right, 12/03/2017); Skin split graft (Right, 12/05/2017); Amputation (Right, 12/15/2017); Lower Extremity  Angiography (N/A, 03/03/2018); PERIPHERAL VASCULAR ATHERECTOMY (Left, 03/03/2018); PERIPHERAL VASCULAR INTERVENTION (03/03/2018); Esophagogastroduodenoscopy (Left, 03/09/2018); and Colonoscopy with propofol (N/A, 03/12/2018).  Allergies  Allergen Reactions  . Cinnamon Hives  . Ciprofloxacin Other (See Comments)    TREMORS  . Diovan [Valsartan] Other (See Comments)    Extreme vertigo  . Food Diarrhea and Other (See Comments)    ORANGE JUICE   UPSET STOMACH  . Latex Rash and Other (See Comments)    Rash/inflammation due to exposure  . Nitrofuran Derivatives Hives and Rash    "Full body rash"  . Penicillins Hives and Swelling    *tolerated Ceftriaxone September 2018 Has patient had a PCN reaction causing immediate rash, facial/tongue/throat swelling, SOB or lightheadedness with hypotension:No--severe irritation at the injection site Has patient had a PCN reaction causing severe rash involving mucus membranes or skin necrosis:Unknown Has patient had a PCN reaction that required hospitalization:No Has patient had a PCN reaction occurring within the last 10 years:Yes If all of the above answers are "NO", then may proceed with  . Bactrim [Sulfamethoxazole-Trimethoprim] Diarrhea and Nausea Only  . Other Rash    Mycins, Strawberry, Oranges  . Sulfa Antibiotics Diarrhea and Nausea Only    No current facility-administered medications on file prior to encounter.    Current Outpatient Medications on File Prior to Encounter  Medication Sig  . amitriptyline (ELAVIL) 50 MG tablet Take 50 mg by mouth at bedtime.  Marland Kitchen aspirin EC 81 MG tablet Take 81 mg by mouth daily with breakfast.  . benazepril (LOTENSIN) 40 MG tablet Take 40 mg by mouth daily.  . Calcium Carbonate-Vitamin D (CALCIUM 600+D) 600-400 MG-UNIT per tablet Take 1 tablet by mouth daily at 3 pm.   . cetirizine (ZYRTEC)  10 MG tablet Take 10 mg by mouth at bedtime.   Marland Kitchen diltiazem (TIAZAC) 120 MG 24 hr capsule Take 120 mg by mouth daily with  breakfast.   . docusate sodium (COLACE) 100 MG capsule Take 1 capsule (100 mg total) by mouth daily.  Marland Kitchen doxycycline (VIBRA-TABS) 100 MG tablet Take 100 mg by mouth 2 (two) times daily. For 10 days  . escitalopram (LEXAPRO) 10 MG tablet Take 10 mg by mouth daily.  . famotidine (PEPCID) 40 MG tablet Take 40 mg by mouth daily at 3 pm. 1600  . feeding supplement, ENSURE ENLIVE, (ENSURE ENLIVE) LIQD Take 237 mLs by mouth 3 (three) times daily with meals.  . fentaNYL (DURAGESIC - DOSED MCG/HR) 25 MCG/HR patch Place 25 mcg onto the skin every 3 (three) days.  . ferrous sulfate 325 (65 FE) MG tablet Take 325 mg by mouth 2 (two) times daily with a meal.  . furosemide (LASIX) 20 MG tablet Take 20 mg by mouth daily.   . hydrALAZINE (APRESOLINE) 50 MG tablet Take 50 mg by mouth 4 (four) times daily.  . methocarbamol (ROBAXIN) 500 MG tablet Take 500 mg by mouth every 6 (six) hours as needed for muscle spasms.  . Multiple Vitamins-Minerals (MULTIVITAMIN WITH MINERALS) tablet Take 1 tablet by mouth daily.  Marland Kitchen oxyCODONE (OXY IR/ROXICODONE) 5 MG immediate release tablet Take 5 mg by mouth every 4 (four) hours. Every 30 mins before therapy   . pantoprazole (PROTONIX) 40 MG tablet Take 1 tablet (40 mg total) by mouth daily.  . polyethylene glycol (MIRALAX / GLYCOLAX) packet Take 17 g by mouth 2 (two) times daily.  Marland Kitchen senna-docusate (SENNA-PLUS) 8.6-50 MG tablet Take 2 tablets by mouth 2 (two) times daily.  Marland Kitchen SYNTHROID 175 MCG tablet Take 175 mcg by mouth daily before breakfast.   . vitamin C (ASCORBIC ACID) 500 MG tablet Take 500 mg by mouth daily.    FAMILY HISTORY:  Her indicated that her mother is deceased. She indicated that her father is deceased. She indicated that only one of her two sisters is alive. She indicated that the status of her maternal grandmother is unknown.   SOCIAL HISTORY: She  reports that she quit smoking about 38 years ago. She has a 5.00 pack-year smoking history. She has never used  smokeless tobacco. She reports that she drinks about 8.4 oz of alcohol per week. She reports that she does not use drugs.  REVIEW OF SYSTEMS:   As per HPI  SUBJECTIVE:  No c/o today  VITAL SIGNS: BP (!) 172/66   Pulse 70   Temp 98.4 F (36.9 C) (Oral)   Resp 13   Ht 5\' 3"  (1.6 m)   Wt 89.2 kg (196 lb 10.4 oz)   SpO2 94%   BMI 34.84 kg/m   HEMODYNAMICS:    VENTILATOR SETTINGS:    INTAKE / OUTPUT: I/O last 3 completed shifts: In: 6073 [P.O.:1177; I.V.:1750; Blood:315; IV [id number]:546] Out: 2875 [Urine:2872; Stool:3]  PHYSICAL EXAMINATION: General:  WD/WN NAD Neuro:  A&O x3. No focal deficits HEENT:  [place]/AT PRRL EOMI OP-neg Cardiovascular:  2-3/6 syst murmur. No gallops, rubs Lungs:  Minimal crackles Abdomen:  NT, +BS Musculoskeletal:  R AKA; warm LLE with stasis changes Skin:  No rashes  LABS:  BMET Recent Labs  Lab 03/11/18 0624 03/12/18 0418 03/13/18 0400  NA 135 135 137  K 4.6 3.3* 4.0  CL 103 104 107  CO2 20* 23 23  BUN 39* 31* 27*  CREATININE 2.09*  1.65* 1.48*  GLUCOSE 121* 74 94    Electrolytes Recent Labs  Lab 03/11/18 0624 03/12/18 0418 03/13/18 0400  CALCIUM 7.6* 7.7* 8.0*  MG 1.6*  --   --   PHOS 5.4*  --   --     CBC Recent Labs  Lab 03/12/18 1635 03/12/18 2333 03/13/18 0400  WBC 8.3 9.8 9.7  HGB 8.6* 8.6* 8.7*  HCT 25.9* 26.4* 26.8*  PLT 152 175 173    Coag's Recent Labs  Lab 03/10/18 0315  INR 1.31    Sepsis Markers Recent Labs  Lab 03/10/18 1455 03/10/18 1456 03/11/18 0624  LATICACIDVEN 1.4  --   --   PROCALCITON  --  1.01 0.56    ABG No results for input(s): PHART, PCO2ART, PO2ART in the last 168 hours.  Liver Enzymes Recent Labs  Lab 03/11/18 0624 03/13/18 0400  AST  --  18  ALT  --  16  ALKPHOS  --  33*  BILITOT  --  0.6  ALBUMIN 2.4* 2.5*    Cardiac Enzymes No results for input(s): TROPONINI, PROBNP in the last 168 hours.  Glucose Recent Labs  Lab 03/12/18 1124 03/12/18 1631  03/12/18 1947 03/12/18 2355 03/13/18 0352 03/13/18 0734  GLUCAP 78 108* 103* 104* 91 111*    Imaging Dg Chest Port 1 View  Result Date: 03/13/2018 CLINICAL DATA:  Short of breath.  Colonoscopy yesterday EXAM: PORTABLE CHEST 1 VIEW COMPARISON:  03/09/2018 FINDINGS: Hypoventilation with mild bibasilar atelectasis/infiltrate. Negative for heart failure or effusion Left subclavian central venous catheter tip in the SVC unchanged. No pneumothorax. IMPRESSION: Progression of mild bibasilar atelectasis/infiltrate. Electronically Signed   By: Franchot Gallo M.D.   On: 03/13/2018 09:26     STUDIES:  PCXR - increased BV markings. No discrete infiltrates  CULTURES: Negative  ANTIBIOTICS: Off all  SIGNIFICANT EVENTS: EGD and colonoscopy did not identify a bleeding source  LINES/TUBES: none  DISCUSSION: S/P GIB with unidentified source. Patient has since remained hemodyn stable. Can leave ICU. PCCM will sign off.  ASSESSMENT / PLAN:  PULMONARY A: No active issues P:   Suppl O2 prn  CARDIOVASCULAR A:  Hemodyn stable P:  Can resume anti-HTN meds  RENAL A:   Mild azotemia. Making good urine P:   D/C Foley  GASTROINTESTINAL A:   S/P GIB with negative endoscopy P:   Per GI note  HEMATOLOGIC A:   Stable H&H P:  Monitor trend  INFECTIOUS A:   Afebrile; no leukocytosis P:   Follow  ENDOCRINE A:   Hyperglycemia/hypothyroidism   P:   Cont SSI and synthroid  NEUROLOGIC A:   Chronic pain/depression P:   Monitor   Critical Care time: 30 min  Pulmonary and Critical Care Medicine [facility] Pager: [phone]  03/13/2018, 9:41 AM

## 2018-03-13 NOTE — Progress Notes (Signed)
CSW spoke with pt regarding plan at time of DC.  Willing to have CSW look into SNF options for short term rehab following long hospital stay.  Was at [facility]- has used 59 of her days (so has 69 left).  Pt eventual plan is to return home with her spouse.  CSW will continue to follow  Jorge Ny, LCSW Clinical Social Worker [phone]

## 2018-03-13 NOTE — NC FL2 (Signed)
[place] LEVEL OF CARE SCREENING TOOL     IDENTIFICATION  Patient Name: Shelby Reese Birthdate: [birth year] Sex: female Admission Date (Current Location): 02/23/2018  [facility] and [place] Number:  Herbalist and Address:  [facility] [place], [place] [address], [place], [place] [address]      Provider Number: [id number]  Attending Physician Name and Address:  Sampson Goon, MD  Relative Name and Phone Number:       Current Level of Care: Hospital Recommended Level of Care: [place] Prior Approval Number:    Date Approved/Denied:   PASRR Number: [id number]  Discharge Plan: SNF    Current Diagnoses: Patient Active Problem List   Diagnosis Date Noted  . Melena 03/09/2018  . Shocks, hemorrhagic ([place]) 03/09/2018  . GI bleed 03/09/2018  . Fecal occult blood test positive   . Hypervolemia   . Acute lower UTI 03/04/2018  . AKI (acute kidney injury) ([place])   . Arterioloscleroses 03/02/2018  . Anemia 02/23/2018  . Foot ulcer ([place]) 02/23/2018  . Gangrene of foot ([place]) 02/18/2018  . Depression due to physical illness 02/18/2018  . Chronic ulcer of right heel with necrosis of bone ([place]) 12/27/2017  . Peripheral vascular disease of lower extremity with ulceration ([place]) 12/27/2017  . Acute renal failure superimposed on stage 2 chronic kidney disease ([place]) 12/27/2017  . GERD (gastroesophageal reflux disease) 12/27/2017  . Acute blood loss as cause of postoperative anemia 12/13/2017  . Wound of right leg, sequela 12/12/2017  . Open wound of right knee 11/28/2017  . Allergic drug rash   . Sepsis ([place]) 08/29/2017  . CKD (chronic kidney disease), stage III ([place]) 08/29/2017  . Hypothyroid 08/29/2017  . Hypertension 08/29/2017  . Peripheral vascular disease ([place]) 08/29/2017  . Skin ulcer of knee, right, with fat layer exposed ([place]) 08/29/2017  . Acute kidney injury ([place]) 08/29/2017  . Metabolic acidosis 64/40/3474  . Acute  hyponatremia 08/29/2017  . Pressure injury of skin 07/25/2017  . Wound, open, knee, lower leg, or ankle with complication, right, initial encounter   . Idiopathic chronic venous hypertension of both lower extremities with ulcer and inflammation ([place]) 07/15/2017  . Skin ulcer of right knee with necrosis of muscle ([place]) 07/15/2017  . Atherosclerosis of artery of right lower extremity ([place]) 07/11/2017  . Essential hypertension 07/11/2017  . Osteoporosis 07/11/2017  . Cataracts, bilateral 07/11/2017  . Hypothyroidism, acquired 12/27/2014  . Iron deficiency anemia 12/27/2014  . Hiatal hernia 12/27/2014  . Varicose veins of lower extremities with other complications 25/95/6387    Orientation RESPIRATION BLADDER Height & Weight     Self, Time, Situation, Place  Normal Incontinent, External catheter Weight: 196 lb 10.4 oz (89.2 kg) Height:  5\' 3"  (160 cm)  BEHAVIORAL SYMPTOMS/MOOD NEUROLOGICAL BOWEL NUTRITION STATUS      Incontinent Diet(see DC summary)  AMBULATORY STATUS COMMUNICATION OF NEEDS Skin   Extensive Assist Verbally Other (Comment)(unstageable on left heel with xeroform dressing)                       Personal Care Assistance Level of Assistance  Bathing, Dressing Bathing Assistance: Maximum assistance Feeding assistance: Maximum assistance Dressing Assistance: Maximum assistance     Functional Limitations Info  Sight, Hearing, Speech Sight Info: Impaired(wears glasses) Hearing Info: Adequate Speech Info: Adequate    SPECIAL CARE FACTORS FREQUENCY  PT (By licensed PT), OT (By licensed OT)     PT Frequency: 5/wk  OT Frequency: 5/wk            Contractures Contractures Info: Not present    Additional Factors Info  Code Status, Allergies, Insulin Sliding Scale Code Status Info: FULL Allergies Info: Cinnamon, Ciprofloxacin, Diovan Valsartan, Food, Latex, Nitrofuran Derivatives, Penicillins, Bactrim Sulfamethoxazole-trimethoprim, Other, Sulfa Antibiotics    Insulin Sliding Scale Info: 6/day       Current Medications (03/13/2018):  This is the current hospital active medication list Current Facility-Administered Medications  Medication Dose Route Frequency Provider Last Rate Last Dose  . 0.9 %  sodium chloride infusion  250 mL Intravenous PRN Clarene Essex, MD      . 0.9 %  sodium chloride infusion   Intravenous Continuous Clarene Essex, MD   Stopped at 03/13/18 [phone]  . 0.9 %  sodium chloride infusion   Intravenous Once Clarene Essex, MD      . 0.9 %  sodium chloride infusion   Intra-arterial PRN Clarene Essex, MD      . 0.9 %  sodium chloride infusion   Intravenous Once Clarene Essex, MD      . acetaminophen (TYLENOL) tablet 650 mg  650 mg Oral Q4H PRN Clarene Essex, MD   650 mg at 03/12/18 8938  . amitriptyline (ELAVIL) tablet 50 mg  50 mg Oral QHS Clarene Essex, MD   50 mg at 03/12/18 2115  . atorvastatin (LIPITOR) tablet 10 mg  10 mg Oral q1800 Clarene Essex, MD   10 mg at 03/12/18 1714  . benazepril (LOTENSIN) tablet 40 mg  40 mg Oral Daily Crim, Courtney, MD   40 mg at 03/13/18 1404  . calcium-vitamin D (OSCAL WITH D) 500-200 MG-UNIT per tablet 1 tablet  1 tablet Oral Q breakfast Clarene Essex, MD   1 tablet at 03/13/18 0846  . chlorhexidine (PERIDEX) 0.12 % solution 15 mL  15 mL Mouth Rinse BID Clarene Essex, MD   15 mL at 03/12/18 2122  . escitalopram (LEXAPRO) tablet 10 mg  10 mg Oral Daily Clarene Essex, MD   10 mg at 03/13/18 0846  . feeding supplement (ENSURE ENLIVE) (ENSURE ENLIVE) liquid 237 mL  237 mL Oral TID WC Clarene Essex, MD   237 mL at 03/12/18 1716  . fentaNYL (DURAGESIC - dosed mcg/hr) patch 25 mcg  25 mcg Transdermal Q72H Clarene Essex, MD   25 mcg at 03/10/18 2103  . hydrALAZINE (APRESOLINE) injection 5 mg  5 mg Intravenous Q20 Min PRN Clarene Essex, MD   5 mg at 03/13/18 1017  . hydrALAZINE (APRESOLINE) tablet 50 mg  50 mg Oral QID Jamesetta So, MD   50 mg at 03/13/18 1404  . hydrocortisone cream 0.5 %   Topical BID Clarene Essex, MD      .  insulin aspart (novoLOG) injection 1-3 Units  1-3 Units Subcutaneous Q4H Clarene Essex, MD   1 Units at 03/11/18 1701  . levothyroxine (SYNTHROID, LEVOTHROID) tablet 175 mcg  175 mcg Oral QAC breakfast Clarene Essex, MD   175 mcg at 03/13/18 0630  . loratadine (CLARITIN) tablet 10 mg  10 mg Oral Daily Clarene Essex, MD   10 mg at 03/13/18 0846  . LORazepam (ATIVAN) tablet 0.5 mg  0.5 mg Oral BID PRN Clarene Essex, MD   0.5 mg at 03/08/18 2221  . magnesium citrate solution 1 Bottle  1 Bottle Oral Daily PRN Clarene Essex, MD      . MEDLINE mouth rinse  15 mL Mouth Rinse q12n4p Clarene Essex, MD   15 mL  at 03/12/18 1129  . methocarbamol (ROBAXIN) tablet 500 mg  500 mg Oral Q6H PRN Clarene Essex, MD   500 mg at 03/08/18 1037  . multivitamin with minerals tablet 1 tablet  1 tablet Oral Daily Clarene Essex, MD   1 tablet at 03/13/18 0845  . ondansetron (ZOFRAN) injection 4 mg  4 mg Intravenous Q6H PRN Clarene Essex, MD   4 mg at 03/11/18 1039  . oxyCODONE (Oxy IR/ROXICODONE) immediate release tablet 5 mg  5 mg Oral Q6H PRN Clarene Essex, MD      . pantoprazole (PROTONIX) EC tablet 40 mg  40 mg Oral Daily Clarene Essex, MD   40 mg at 03/13/18 0846  . sodium chloride flush (NS) 0.9 % injection 3 mL  3 mL Intravenous Q12H Clarene Essex, MD   3 mL at 03/13/18 0848  . sodium chloride flush (NS) 0.9 % injection 3 mL  3 mL Intravenous PRN Clarene Essex, MD      . vitamin C (ASCORBIC ACID) tablet 500 mg  500 mg Oral Daily Clarene Essex, MD   500 mg at 03/13/18 7106     Discharge Medications: Please see discharge summary for a list of discharge medications.  Relevant Imaging Results:  Relevant Lab Results:   Additional Information SSN: [id number]  Jorge Ny, LCSW

## 2018-03-13 NOTE — Progress Notes (Signed)
CVC removed at 0620 per verbal order. No complications, catheter intact.

## 2018-03-13 NOTE — Progress Notes (Signed)
Occupational Therapy Treatment Patient Details Name: Shelby Reese MRN: [id number] DOB: 01-May-1935 Today's Date: 03/13/2018    History of present illness Pt is an 83 y.o. female with PMH of PAD s/p R AKA (12/15/17), bilateral severe chronic venous stasis, HTN, admitted 02/23/18 with acute on chronic anemia with guaiac positive stools and AKI in the setting of worsening L foot ulcer and infection. Underwent CO2 angiography with subsequent CFA arthrectomy and stent placement on 4/16. Suspected GIB with melena; EGD normal 4/22; awaiting colonoscopy.    OT comments  Pt declined any OOB or EOB activity today despite education and verbal acknowledgement that she needs to move more. Pt agreeable to bed level bathing session. She was able to wash her trunk, arms, peri area, required max A for back and BLE. Pt complaining of phantom limb pain during session and educated in massage technique for pain management. Pt provided with warm blankets at the end of the session, and all needs met. OT will continue to follow acutely.   Follow Up Recommendations  SNF;Supervision/Assistance - 24 hour    Equipment Recommendations  Other (comment)(defer to next venue)    Recommendations for Other Services      Precautions / Restrictions Precautions Precautions: Fall Precaution Comments: recent R AKA (11/2017), L foot ulcer       Mobility Bed Mobility Overal bed mobility: Needs Assistance Bed Mobility: Rolling Rolling: Max assist         General bed mobility comments: use of bed pad and grab bars to assist  Transfers                 General transfer comment: NT    Balance                                           ADL either performed or assessed with clinical judgement   ADL Overall ADL's : Needs assistance/impaired         Upper Body Bathing: Moderate assistance;Bed level Upper Body Bathing Details (indicate cue type and reason): Pt able to perform all front washing,  required assist for back from therapist Lower Body Bathing: Maximal assistance;Sitting/lateral leans Lower Body Bathing Details (indicate cue type and reason): max A for LB Upper Body Dressing : Minimal assistance;Bed level Upper Body Dressing Details (indicate cue type and reason): to don new hospital gown                   General ADL Comments: self-limiting this session "I know I should but I just can't do it today"     Vision       Perception     Praxis      Cognition Arousal/Alertness: Awake/alert Behavior During Therapy: WFL for tasks assessed/performed Overall Cognitive Status: No family/caregiver present to determine baseline cognitive functioning Area of Impairment: Problem solving;Following commands                       Following Commands: Follows one step commands with increased time     Problem Solving: Slow processing;Decreased initiation;Difficulty sequencing General Comments: Pt appropriately interactive throughout session; demonstrates some slowed processing and problem solving        Exercises     Shoulder Instructions       General Comments      Pertinent Vitals/ Pain       Pain Assessment:  0-10 Pain Score: 4  Pain Location: LLE (calf) Pain Descriptors / Indicators: Sore;Tender Pain Intervention(s): Monitored during session;Repositioned  Home Living                                          Prior Functioning/Environment              Frequency  Min 2X/week        Progress Toward Goals  OT Goals(current goals can now be found in the care plan section)  Progress towards OT goals: Not progressing toward goals - comment(Pt declined anything other than bed level today)  Acute Rehab OT Goals Patient Stated Goal: be able to transfer to [facility] OT Goal Formulation: With patient Time For Goal Achievement: 03/25/18 Potential to Achieve Goals: Good  Plan Discharge plan remains appropriate;Frequency remains  appropriate    Co-evaluation                 AM-PAC PT "6 Clicks" Daily Activity     Outcome Measure   Help from another person eating meals?: A Little Help from another person taking care of personal grooming?: A Little Help from another person toileting, which includes using toliet, bedpan, or urinal?: Total Help from another person bathing (including washing, rinsing, drying)?: A Lot Help from another person to put on and taking off regular upper body clothing?: A Lot Help from another person to put on and taking off regular lower body clothing?: Total 6 Click Score: 12    End of Session    OT Visit Diagnosis: Muscle weakness (generalized) (M62.81)   Activity Tolerance Patient limited by fatigue   Patient Left in bed;with call bell/phone within reach;with nursing/sitter in room   Nurse Communication Mobility status        Time: 2542-7062 OT Time Calculation (min): 29 min  Charges: OT General Charges $OT Visit: 1 Visit OT Treatments $Self Care/Home Management : 23-37 mins  Hulda Humphrey OTR/L [place] 03/13/2018, 3:52 PM

## 2018-03-13 NOTE — Progress Notes (Signed)
Pt refuses to mobilize to the chair even with the assistance of a lift. Pt educated and made aware of the need to increase activity to aid in recovery.

## 2018-03-13 NOTE — Plan of Care (Signed)
  Problem: Clinical Measurements: Goal: Respiratory complications will improve Outcome: Progressing Note:  On room air sats >95%   Problem: Nutrition: Goal: Adequate nutrition will be maintained Outcome: Progressing

## 2018-03-13 NOTE — Plan of Care (Signed)
  Problem: Health Behavior/Discharge Planning: Goal: Ability to manage health-related needs will improve Outcome: Progressing   Problem: Clinical Measurements: Goal: Ability to maintain clinical measurements within normal limits will improve Outcome: Progressing Goal: Will remain free from infection Outcome: Progressing Goal: Diagnostic test results will improve Outcome: Progressing Goal: Respiratory complications will improve Outcome: Progressing Goal: Cardiovascular complication will be avoided Outcome: Progressing   Problem: Nutrition: Goal: Adequate nutrition will be maintained Outcome: Progressing   Problem: Coping: Goal: Level of anxiety will decrease Outcome: Progressing   Problem: Pain Managment: Goal: General experience of comfort will improve Outcome: Progressing   Problem: Safety: Goal: Ability to remain free from injury will improve Outcome: Progressing   Problem: Spiritual Needs Goal: Ability to function at adequate level Outcome: Progressing   Problem: Self-Care: Goal: Ability to meet self-care needs will improve Outcome: Progressing   Problem: Self-Concept: Goal: Ability to maintain and perform role responsibilities to the fullest extent possible will improve Outcome: Progressing   Problem: Pain Management: Goal: Pain level will decrease with appropriate interventions Outcome: Progressing   Problem: Activity: Goal: Risk for activity intolerance will decrease Outcome: Not Progressing   Problem: Skin Integrity: Goal: Risk for impaired skin integrity will decrease Outcome: Not Progressing   Problem: Activity: Goal: Ability to perform//tolerate increased activity and mobilize with assistive devices will improve Outcome: Not Progressing

## 2018-03-14 LAB — CBC
HCT: 27.9 % — ABNORMAL LOW (ref 36.0–46.0)
Hemoglobin: 9.1 g/dL — ABNORMAL LOW (ref 12.0–15.0)
MCH: 30.2 pg (ref 26.0–34.0)
MCHC: 32.6 g/dL (ref 30.0–36.0)
MCV: 92.7 fL (ref 78.0–100.0)
PLATELETS: 209 10*3/uL (ref 150–400)
RBC: 3.01 MIL/uL — ABNORMAL LOW (ref 3.87–5.11)
RDW: 16 % — AB (ref 11.5–15.5)
WBC: 9.9 10*3/uL (ref 4.0–10.5)

## 2018-03-14 LAB — GLUCOSE, CAPILLARY
Glucose-Capillary: 81 mg/dL (ref 65–99)
Glucose-Capillary: 74 mg/dL (ref 65–99)

## 2018-03-14 MED ORDER — ASPIRIN 81 MG PO CHEW
81.0000 mg | CHEWABLE_TABLET | Freq: Every day | ORAL | Status: DC
  Administered 2018-03-15 – 2018-03-18 (×4): 81 mg via ORAL
  Filled 2018-03-14 (×4): qty 1

## 2018-03-14 MED ORDER — HYDRALAZINE HCL 50 MG PO TABS
50.0000 mg | ORAL_TABLET | Freq: Three times a day (TID) | ORAL | Status: DC
  Administered 2018-03-14 – 2018-03-18 (×11): 50 mg via ORAL
  Filled 2018-03-14 (×11): qty 1

## 2018-03-14 NOTE — Progress Notes (Signed)
Patient Demographics:    Shelby Reese, is a 83 y.o. female, DOB - [birth year], HCW:[id number]  Admit date - 02/23/2018   Admitting Physician [name], MD  Outpatient Primary MD for the patient is [name], MD  LOS - 84   Chief Complaint  Patient presents with  . Wound Infection        Subjective:    Shelby Reese today has no fevers, no emesis,  No chest pain,     Assessment  & Plan :    Principal Problem:   Shocks, hemorrhagic (HCC) Active Problems:   Hypothyroidism, acquired   Iron deficiency anemia   Atherosclerosis of artery of right lower extremity (HCC)   Essential hypertension   CKD (chronic kidney disease), stage III (HCC)   Peripheral vascular disease (HCC)   Peripheral vascular disease of lower extremity with ulceration (HCC)   Anemia   Foot ulcer (HCC)   Arterioloscleroses   Acute lower UTI   AKI (acute kidney injury) ([place])   Melena   Fecal occult blood test positive   Hypervolemia   GI bleed   Plan:- 1)Acute blood loss anemia with hemorrhagic shock---- Now resolved.-Hemoglobin is 9.1 , EGD and colonoscopy without any identifiable source of bleeding  2)AKI---- improving, AKI was due to prerenal azotemia in the setting of acute GI bleed-     creatinine on admission=2.30  ,   baseline creatinine = 1.0   , creatinine is now= 1.48    ,  Avoid nephrotoxic agents/dehydration/hypotension   3) ischemic ulceration of left foot. s/p left CFA atherectomy and PTA, SFA atherectomy and stent  Placement on 03/03/2018 -per GI service okay to restart aspirin, continue Lipitor  4)HTN-continue benazepril 40 mg daily , as well as hydralazine 50 mg 3 times daily  5)Hypothyroidism-continue levothyroxine 175 mcg daily  6)Dispo- SNF   Code Status : Full code  Consults  :  Gi  DVT Prophylaxis  :  SCDs   Lab Results  Component Value Date   PLT 209 03/14/2018    Inpatient  Medications  Scheduled Meds: . amitriptyline  50 mg Oral QHS  . [START ON 03/15/2018] aspirin  81 mg Oral Daily  . atorvastatin  10 mg Oral q1800  . benazepril  40 mg Oral Daily  . calcium-vitamin D  1 tablet Oral Q breakfast  . chlorhexidine  15 mL Mouth Rinse BID  . escitalopram  10 mg Oral Daily  . feeding supplement (ENSURE ENLIVE)  237 mL Oral TID WC  . fentaNYL  25 mcg Transdermal Q72H  . hydrALAZINE  50 mg Oral QID  . hydrocortisone cream   Topical BID  . levothyroxine  175 mcg Oral QAC breakfast  . loratadine  10 mg Oral Daily  . mouth rinse  15 mL Mouth Rinse q12n4p  . multivitamin with minerals  1 tablet Oral Daily  . pantoprazole  40 mg Oral Daily  . sodium chloride flush  3 mL Intravenous Q12H  . vitamin C  500 mg Oral Daily   Continuous Infusions: . sodium chloride    . sodium chloride    . sodium chloride    . sodium chloride     PRN Meds:.sodium chloride, Place/Maintain arterial line **AND** sodium chloride, acetaminophen,  hydrALAZINE, LORazepam, magnesium citrate, methocarbamol, ondansetron (ZOFRAN) IV, oxyCODONE, sodium chloride flush    Anti-infectives (From admission, onward)   Start     Dose/Rate Route Frequency Ordered Stop   02/24/18 1400  aztreonam (AZACTAM) 1 g in sodium chloride 0.9 % 100 mL IVPB  Status:  Discontinued     1 g 200 mL/hr over 30 Minutes Intravenous Every 8 hours 02/24/18 1001 02/24/18 1010   02/24/18 1200  cefTRIAXone (ROCEPHIN) 2 g in sodium chloride 0.9 % 100 mL IVPB  Status:  Discontinued     2 g 200 mL/hr over 30 Minutes Intravenous Daily 02/24/18 1012 03/05/18 0806   02/24/18 0400  ceFAZolin (ANCEF) IVPB 1 g/50 mL premix  Status:  Discontinued     1 g 100 mL/hr over 30 Minutes Intravenous Every 12 hours 02/23/18 2145 02/24/18 1001   02/24/18 0000  clindamycin (CLEOCIN) IVPB 600 mg  Status:  Discontinued     600 mg 100 mL/hr over 30 Minutes Intravenous Every 8 hours 02/23/18 2113 03/04/18 1003   02/23/18 1600  clindamycin  (CLEOCIN) IVPB 600 mg     600 mg 100 mL/hr over 30 Minutes Intravenous  Once 02/23/18 1553 02/23/18 1804   02/23/18 1600  ceFAZolin (ANCEF) IVPB 1 g/50 mL premix     1 g 100 mL/hr over 30 Minutes Intravenous  Once 02/23/18 1553 02/23/18 1709        Objective:   Vitals:   03/14/18 1230 03/14/18 1238 03/14/18 1505 03/14/18 1712  BP: (!) 174/71  (!) 153/49 (!) 181/66  Pulse: 86  77   Resp: (!) 22     Temp:  98.7 F (37.1 C) 98.8 F (37.1 C)   TempSrc:  Oral Oral   SpO2: 97%     Weight:      Height:        Wt Readings from Last 3 Encounters:  03/14/18 91.7 kg (202 lb 2.6 oz)  02/16/18 84.9 kg (187 lb 3.2 oz)  02/02/18 87.5 kg (192 lb 12.8 oz)     Intake/Output Summary (Last 24 hours) at 03/14/2018 1847 Last data filed at 03/14/2018 1800 Gross per 24 hour  Intake 880 ml  Output 1000 ml  Net -120 ml     Physical Exam  Gen:- Awake Alert,  In no apparent distress  HEENT:- [place].AT, No sclera icterus Neck-Supple Neck,No JVD,.  Lungs-  CTAB , good air movement CV- S1, S2 normal Abd-  +ve B.Sounds, Abd Soft, No tenderness,    Extremity/Skin:- No  edema,   Good pulses Psych-affect is appropriate, oriented x3 Neuro-no new focal deficits, no tremors   Data Review:   Micro Results Recent Results (from the past 240 hour(s))  MRSA PCR Screening     Status: None   Collection Time: 03/09/18 11:55 PM  Result Value Ref Range Status   MRSA by PCR NEGATIVE NEGATIVE Final    Comment:        The GeneXpert MRSA Assay (FDA approved for NASAL specimens only), is one component of a comprehensive MRSA colonization surveillance program. It is not intended to diagnose MRSA infection nor to guide or monitor treatment for MRSA infections. Performed at [facility], [place] [address] [place], [place] [address]   Culture, blood (routine x 2)     Status: None (Preliminary result)   Collection Time: 03/10/18  6:36 PM  Result Value Ref Range Status   Specimen Description BLOOD  RIGHT HAND  Final   Special Requests  Final    BOTTLES DRAWN AEROBIC ONLY Blood Culture adequate volume   Culture   Final    NO GROWTH 4 DAYS Performed at [facility], [place] [address], [place], [place] [address]    Report Status PENDING  Incomplete  Culture, blood (routine x 2)     Status: None (Preliminary result)   Collection Time: 03/10/18  6:36 PM  Result Value Ref Range Status   Specimen Description BLOOD RIGHT ANTECUBITAL  Final   Special Requests   Final    BOTTLES DRAWN AEROBIC ONLY Blood Culture adequate volume   Culture   Final    NO GROWTH 4 DAYS Performed at [facility], [place] [address], [place], [place] [address]    Report Status PENDING  Incomplete    Radiology Reports US Renal  Result Date: 02/25/2018 CLINICAL DATA:  Acute kidney injury, history hypertension, stage III chronic kidney disease, former smoker EXAM: RENAL / URINARY TRACT ULTRASOUND COMPLETE COMPARISON:  None FINDINGS: Right Kidney: Length: 9.9 cm. Minimal cortical thinning. Normal cortical echogenicity. No mass, hydronephrosis or shadowing calcification. Left Kidney: Length: 9.6 cm. Minimal cortical thinning. Normal cortical echogenicity. No mass, hydronephrosis or shadowing calcification. Bladder: Echogenic debris dependently within urinary bladder. No gross bladder wall thickening/mass. IMPRESSION: Unremarkable sonographic appearance of the kidneys for age. Significant echogenic debris dependently within urinary bladder. Electronically Signed   By: Lavonia Dana M.D.   On: 02/25/2018 10:56   Dg Chest Port 1 View  Result Date: 03/13/2018 CLINICAL DATA:  Short of breath.  Colonoscopy yesterday EXAM: PORTABLE CHEST 1 VIEW COMPARISON:  03/09/2018 FINDINGS: Hypoventilation with mild bibasilar atelectasis/infiltrate. Negative for heart failure or effusion Left subclavian central venous catheter tip in the SVC unchanged. No pneumothorax. IMPRESSION: Progression of mild bibasilar atelectasis/infiltrate.  Electronically Signed   By: Franchot Gallo M.D.   On: 03/13/2018 09:26   Dg Chest Port 1 View  Result Date: 03/09/2018 CLINICAL DATA:  Central line placement. EXAM: PORTABLE CHEST 1 VIEW COMPARISON:  03/07/2018. FINDINGS: Low lung volumes. LEFT subclavian line has been placed. Tip lies at the cavoatrial junction. There is no pneumothorax. Slight improvement aeration. Some clearing of edema. Low lung volumes with scarring on the LEFT appears stable. IMPRESSION: LEFT subclavian line placement, satisfactory position cavoatrial junction. No pneumothorax. Electronically Signed   By: Staci Righter M.D.   On: 03/09/2018 19:42   Dg Chest Port 1 View  Result Date: 03/07/2018 CLINICAL DATA:  Shortness of breath and chest pain for 1 day. EXAM: PORTABLE CHEST 1 VIEW COMPARISON:  Chest radiograph August 29, 2017 FINDINGS: Cardiac silhouette is normal in size. Calcified aortic knob. Worsening interstitial prominence. Bandlike density LEFT lower lung zone. Kerley B-lines. No pleural effusion or focal consolidation. No pneumothorax. Osteopenia and dextroscoliosis. IMPRESSION: Worsening interstitial prominence seen with pulmonary edema and/or atypical infection. LEFT lung base atelectasis/scarring. Aortic Atherosclerosis (ICD10-I70.0). Electronically Signed   By: Elon Alas M.D.   On: 03/07/2018 15:06   Dg Foot Complete Left  Result Date: 02/23/2018 CLINICAL DATA:  Left foot redness, pain and swelling. Question osteomyelitis. EXAM: LEFT FOOT - COMPLETE 3+ VIEW COMPARISON:  None. FINDINGS: There appear to be skin wounds over the dorsum of the foot centered at the level of the cuneiforms, neck of the talus and tibiotalar joint. Lucencies are identified in the bones the midfoot, most conspicuous in the cuboid and lateral cuneiform as well as the fourth metatarsal. No fracture. Atherosclerosis noted. No soft tissue gas or radiopaque foreign body. IMPRESSION:  Lucencies in the bones of the midfoot are nonspecific and  could be due to early neuropathic change or osteomyelitis. Skin wounds on the dorsum of the foot. Electronically Signed   By: Inge Rise M.D.   On: 02/23/2018 15:55     CBC Recent Labs  Lab 03/12/18 0418 03/12/18 1045 03/12/18 1635 03/12/18 2333 03/13/18 0400 03/14/18 0231  WBC 9.1 8.4 8.3 9.8 9.7 9.9  HGB 8.6* 9.3* 8.6* 8.6* 8.7* 9.1*  HCT 25.2* 27.8* 25.9* 26.4* 26.8* 27.9*  PLT 137* 146* 152 175 173 209  MCV 89.4 90.0 90.9 92.0 92.4 92.7  MCH 30.5 30.1 30.2 30.0 30.0 30.2  MCHC 34.1 33.5 33.2 32.6 32.5 32.6  RDW 15.3 15.5 15.7* 15.6* 15.8* 16.0*  LYMPHSABS 2.7 2.0 1.9 2.0 2.2  --   MONOABS 0.8 0.9 0.7 0.9 0.8  --   EOSABS 0.3 0.3 0.4 0.4 0.3  --   BASOSABS 0.0 0.0 0.0 0.0 0.0  --     Chemistries  Recent Labs  Lab 03/09/18 0305 03/10/18 0315 03/11/18 0624 03/12/18 0418 03/13/18 0400  NA 133* 135 135 135 137  K 3.4* 4.7 4.6 3.3* 4.0  CL 97* 104 103 104 107  CO2 27 22 20* 23 23  GLUCOSE 82 223* 121* 74 94  BUN 26* 34* 39* 31* 27*  CREATININE 1.42* 2.11* 2.09* 1.65* 1.48*  CALCIUM 9.3 7.6* 7.6* 7.7* 8.0*  MG  --   --  1.6*  --   --   AST  --   --   --   --  18  ALT  --   --   --   --  16  ALKPHOS  --   --   --   --  33*  BILITOT  --   --   --   --  0.6   ------------------------------------------------------------------------------------------------------------------ No results for input(s): CHOL, HDL, LDLCALC, TRIG, CHOLHDL, LDLDIRECT in the last 72 hours.  Lab Results  Component Value Date   HGBA1C 5.6 12/12/2017   ------------------------------------------------------------------------------------------------------------------ No results for input(s): TSH, T4TOTAL, T3FREE, THYROIDAB in the last 72 hours.  Invalid input(s): FREET3 ------------------------------------------------------------------------------------------------------------------ No results for input(s): VITAMINB12, FOLATE, FERRITIN, TIBC, IRON, RETICCTPCT in the last 72  hours.  Coagulation profile Recent Labs  Lab 03/10/18 0315  INR 1.31    No results for input(s): DDIMER in the last 72 hours.  Cardiac Enzymes No results for input(s): CKMB, TROPONINI, MYOGLOBIN in the last 168 hours.  Invalid input(s): CK ------------------------------------------------------------------------------------------------------------------ No results found for: BNP   Roxan Hockey M.D on 03/14/2018 at 6:47 PM  Between 7am to 7pm - Pager - [phone]  After 7pm go to [email or web address] - password [email or web address]  [facility] -  Office  [phone]   Voice Recognition Viviann Spare dictation system was used to create this note, attempts have been made to correct errors. Please contact the author with questions and/or clarifications.

## 2018-03-15 LAB — CULTURE, BLOOD (ROUTINE X 2)
CULTURE: NO GROWTH
CULTURE: NO GROWTH
SPECIAL REQUESTS: ADEQUATE
Special Requests: ADEQUATE

## 2018-03-15 NOTE — Progress Notes (Signed)
Patient Demographics:    Shelby Reese, is a 83 y.o. female, DOB - January 16, 1935, YPP:[id number]  Admit date - 02/23/2018   Admitting Physician [name], MD  Outpatient Primary MD for the patient is Shelby Pretty, MD  LOS - 20   Chief Complaint  Patient presents with  . Wound Infection        Subjective:    Shelby Reese today has no fevers, no emesis,  No chest pain,  Eating well  Assessment  & Plan :    Principal Problem:   Shocks, hemorrhagic (HCC) Active Problems:   Hypothyroidism, acquired   Iron deficiency anemia   Atherosclerosis of artery of right lower extremity (HCC)   Essential hypertension   CKD (chronic kidney disease), stage III (HCC)   Peripheral vascular disease (HCC)   Peripheral vascular disease of lower extremity with ulceration (HCC)   Anemia   Foot ulcer (HCC)   Arterioloscleroses   Acute lower UTI   AKI (acute kidney injury) ([place])   Melena   Fecal occult blood test positive   Hypervolemia   GI bleed   Plan:- 1)Acute blood loss anemia with hemorrhagic shock---- Now resolved.-Hemoglobin is 9.1 , EGD and colonoscopy without any identifiable source of bleeding, monitor CBC and transfuse as clinically indicated  2)AKI---- improving, AKI was due to prerenal azotemia in the setting of acute GI bleed-     creatinine on admission=2.30  ,   baseline creatinine = 1.0   , creatinine is now= 1.48    ,  Avoid nephrotoxic agents/dehydration/hypotension   3) ischemic ulceration of left foot. s/p left CFA atherectomy and PTA, SFA atherectomy and stent  Placement on 03/03/2018 -per GI service  restarted aspirin,  continue Lipitor  4)HTN-continue benazepril 40 mg daily , as well as hydralazine 50 mg 3 times daily  5)Hypothyroidism-continue levothyroxine 175 mcg daily  6)Disposition-  Awaiting SNF placements, patient admitted from adult from rehab facility where she had been for  about 60 days also, she eventually plans to go home with her husband after another stay in rehab this time  Code Status : Full code  Consults  :  Gi  DVT Prophylaxis  :  SCDs   Lab Results  Component Value Date   PLT 209 03/14/2018    Inpatient Medications  Scheduled Meds: . amitriptyline  50 mg Oral QHS  . aspirin  81 mg Oral Daily  . atorvastatin  10 mg Oral q1800  . benazepril  40 mg Oral Daily  . calcium-vitamin D  1 tablet Oral Q breakfast  . chlorhexidine  15 mL Mouth Rinse BID  . escitalopram  10 mg Oral Daily  . feeding supplement (ENSURE ENLIVE)  237 mL Oral TID WC  . fentaNYL  25 mcg Transdermal Q72H  . hydrALAZINE  50 mg Oral TID  . hydrocortisone cream   Topical BID  . levothyroxine  175 mcg Oral QAC breakfast  . loratadine  10 mg Oral Daily  . mouth rinse  15 mL Mouth Rinse q12n4p  . multivitamin with minerals  1 tablet Oral Daily  . pantoprazole  40 mg Oral Daily  . sodium chloride flush  3 mL Intravenous Q12H  . vitamin C  500 mg Oral Daily  Continuous Infusions: . sodium chloride    . sodium chloride    . sodium chloride    . sodium chloride     PRN Meds:.sodium chloride, Place/Maintain arterial line **AND** sodium chloride, acetaminophen, hydrALAZINE, LORazepam, magnesium citrate, methocarbamol, ondansetron (ZOFRAN) IV, oxyCODONE, sodium chloride flush    Anti-infectives (From admission, onward)   Start     Dose/Rate Route Frequency Ordered Stop   02/24/18 1400  aztreonam (AZACTAM) 1 g in sodium chloride 0.9 % 100 mL IVPB  Status:  Discontinued     1 g 200 mL/hr over 30 Minutes Intravenous Every 8 hours 02/24/18 1001 02/24/18 1010   02/24/18 1200  cefTRIAXone (ROCEPHIN) 2 g in sodium chloride 0.9 % 100 mL IVPB  Status:  Discontinued     2 g 200 mL/hr over 30 Minutes Intravenous Daily 02/24/18 1012 03/05/18 0806   02/24/18 0400  ceFAZolin (ANCEF) IVPB 1 g/50 mL premix  Status:  Discontinued     1 g 100 mL/hr over 30 Minutes Intravenous Every 12  hours 02/23/18 2145 02/24/18 1001   02/24/18 0000  clindamycin (CLEOCIN) IVPB 600 mg  Status:  Discontinued     600 mg 100 mL/hr over 30 Minutes Intravenous Every 8 hours 02/23/18 2113 03/04/18 1003   02/23/18 1600  clindamycin (CLEOCIN) IVPB 600 mg     600 mg 100 mL/hr over 30 Minutes Intravenous  Once 02/23/18 1553 02/23/18 1804   02/23/18 1600  ceFAZolin (ANCEF) IVPB 1 g/50 mL premix     1 g 100 mL/hr over 30 Minutes Intravenous  Once 02/23/18 1553 02/23/18 1709        Objective:   Vitals:   03/14/18 2016 03/15/18 0555 03/15/18 0834 03/15/18 1228  BP: (!) 158/57 (!) 189/82 (!) 179/66 (!) 148/57  Pulse: 89 85  92  Resp: 19 14  18   Temp: 99.2 F (37.3 C) 98 F (36.7 C) 98.4 F (36.9 C) 99 F (37.2 C)  TempSrc: Oral Oral Oral Oral  SpO2: 96% 98%  95%  Weight:  90.9 kg (200 lb 6.4 oz)    Height:        Wt Readings from Last 3 Encounters:  03/15/18 90.9 kg (200 lb 6.4 oz)  02/16/18 84.9 kg (187 lb 3.2 oz)  02/02/18 87.5 kg (192 lb 12.8 oz)     Intake/Output Summary (Last 24 hours) at 03/15/2018 1546 Last data filed at 03/15/2018 1325 Gross per 24 hour  Intake 880 ml  Output 600 ml  Net 280 ml     Physical Exam  Gen:- Awake Alert,  In no apparent distress  HEENT:- [place].AT, No sclera icterus Neck-Supple Neck,No JVD,.  Lungs-  CTAB , good air movement CV- S1, S2 normal, 3/6 SM,  Abd-  +ve B.Sounds, Abd Soft, No tenderness,    Extremity/Skin:- No  edema,   Rt AKA, left leg with dressing on  psych-affect is appropriate, oriented x3 Neuro-no new focal deficits, no tremors   Data Review:   Micro Results Recent Results (from the past 240 hour(s))  MRSA PCR Screening     Status: None   Collection Time: 03/09/18 11:55 PM  Result Value Ref Range Status   MRSA by PCR NEGATIVE NEGATIVE Final    Comment:        The GeneXpert MRSA Assay (FDA approved for NASAL specimens only), is one component of a comprehensive MRSA colonization surveillance program. It is  not intended to diagnose MRSA infection nor to guide or monitor treatment for MRSA infections.  Performed at [facility], [place] [address], [place], [place] [address]   Culture, blood (routine x 2)     Status: None   Collection Time: 03/10/18  6:36 PM  Result Value Ref Range Status   Specimen Description BLOOD RIGHT HAND  Final   Special Requests   Final    BOTTLES DRAWN AEROBIC ONLY Blood Culture adequate volume   Culture   Final    NO GROWTH 5 DAYS Performed at [facility], [place] [address], [place], [place] [address]    Report Status 03/15/2018 FINAL  Final  Culture, blood (routine x 2)     Status: None   Collection Time: 03/10/18  6:36 PM  Result Value Ref Range Status   Specimen Description BLOOD RIGHT ANTECUBITAL  Final   Special Requests   Final    BOTTLES DRAWN AEROBIC ONLY Blood Culture adequate volume   Culture   Final    NO GROWTH 5 DAYS Performed at [facility], [place] [address], [place],  [address]    Report Status 03/15/2018 FINAL  Final    Radiology Reports US Renal  Result Date: 02/25/2018 CLINICAL DATA:  Acute kidney injury, history hypertension, stage III chronic kidney disease, former smoker EXAM: RENAL / URINARY TRACT ULTRASOUND COMPLETE COMPARISON:  None FINDINGS: Right Kidney: Length: 9.9 cm. Minimal cortical thinning. Normal cortical echogenicity. No mass, hydronephrosis or shadowing calcification. Left Kidney: Length: 9.6 cm. Minimal cortical thinning. Normal cortical echogenicity. No mass, hydronephrosis or shadowing calcification. Bladder: Echogenic debris dependently within urinary bladder. No gross bladder wall thickening/mass. IMPRESSION: Unremarkable sonographic appearance of the kidneys for age. Significant echogenic debris dependently within urinary bladder. Electronically Signed   By: Lavonia Dana M.D.   On: 02/25/2018 10:56   Dg Chest Port 1 View  Result Date: 03/13/2018 CLINICAL DATA:  Short of breath.  Colonoscopy yesterday  EXAM: PORTABLE CHEST 1 VIEW COMPARISON:  03/09/2018 FINDINGS: Hypoventilation with mild bibasilar atelectasis/infiltrate. Negative for heart failure or effusion Left subclavian central venous catheter tip in the SVC unchanged. No pneumothorax. IMPRESSION: Progression of mild bibasilar atelectasis/infiltrate. Electronically Signed   By: Franchot Gallo M.D.   On: 03/13/2018 09:26   Dg Chest Port 1 View  Result Date: 03/09/2018 CLINICAL DATA:  Central line placement. EXAM: PORTABLE CHEST 1 VIEW COMPARISON:  03/07/2018. FINDINGS: Low lung volumes. LEFT subclavian line has been placed. Tip lies at the cavoatrial junction. There is no pneumothorax. Slight improvement aeration. Some clearing of edema. Low lung volumes with scarring on the LEFT appears stable. IMPRESSION: LEFT subclavian line placement, satisfactory position cavoatrial junction. No pneumothorax. Electronically Signed   By: Staci Righter M.D.   On: 03/09/2018 19:42   Dg Chest Port 1 View  Result Date: 03/07/2018 CLINICAL DATA:  Shortness of breath and chest pain for 1 day. EXAM: PORTABLE CHEST 1 VIEW COMPARISON:  Chest radiograph August 29, 2017 FINDINGS: Cardiac silhouette is normal in size. Calcified aortic knob. Worsening interstitial prominence. Bandlike density LEFT lower lung zone. Kerley B-lines. No pleural effusion or focal consolidation. No pneumothorax. Osteopenia and dextroscoliosis. IMPRESSION: Worsening interstitial prominence seen with pulmonary edema and/or atypical infection. LEFT lung base atelectasis/scarring. Aortic Atherosclerosis (ICD10-I70.0). Electronically Signed   By: Elon Alas M.D.   On: 03/07/2018 15:06   Dg Foot Complete Left  Result Date: 02/23/2018 CLINICAL DATA:  Left foot redness, pain and swelling. Question osteomyelitis. EXAM: LEFT FOOT - COMPLETE 3+ VIEW COMPARISON:  None. FINDINGS: There appear to be skin wounds  over the dorsum of the foot centered at the level of the cuneiforms, neck of the talus and  tibiotalar joint. Lucencies are identified in the bones the midfoot, most conspicuous in the cuboid and lateral cuneiform as well as the fourth metatarsal. No fracture. Atherosclerosis noted. No soft tissue gas or radiopaque foreign body. IMPRESSION: Lucencies in the bones of the midfoot are nonspecific and could be due to early neuropathic change or osteomyelitis. Skin wounds on the dorsum of the foot. Electronically Signed   By: Inge Rise M.D.   On: 02/23/2018 15:55     CBC Recent Labs  Lab 03/12/18 0418 03/12/18 1045 03/12/18 1635 03/12/18 2333 03/13/18 0400 03/14/18 0231  WBC 9.1 8.4 8.3 9.8 9.7 9.9  HGB 8.6* 9.3* 8.6* 8.6* 8.7* 9.1*  HCT 25.2* 27.8* 25.9* 26.4* 26.8* 27.9*  PLT 137* 146* 152 175 173 209  MCV 89.4 90.0 90.9 92.0 92.4 92.7  MCH 30.5 30.1 30.2 30.0 30.0 30.2  MCHC 34.1 33.5 33.2 32.6 32.5 32.6  RDW 15.3 15.5 15.7* 15.6* 15.8* 16.0*  LYMPHSABS 2.7 2.0 1.9 2.0 2.2  --   MONOABS 0.8 0.9 0.7 0.9 0.8  --   EOSABS 0.3 0.3 0.4 0.4 0.3  --   BASOSABS 0.0 0.0 0.0 0.0 0.0  --     Chemistries  Recent Labs  Lab 03/09/18 0305 03/10/18 0315 03/11/18 0624 03/12/18 0418 03/13/18 0400  NA 133* 135 135 135 137  K 3.4* 4.7 4.6 3.3* 4.0  CL 97* 104 103 104 107  CO2 27 22 20* 23 23  GLUCOSE 82 223* 121* 74 94  BUN 26* 34* 39* 31* 27*  CREATININE 1.42* 2.11* 2.09* 1.65* 1.48*  CALCIUM 9.3 7.6* 7.6* 7.7* 8.0*  MG  --   --  1.6*  --   --   AST  --   --   --   --  18  ALT  --   --   --   --  16  ALKPHOS  --   --   --   --  33*  BILITOT  --   --   --   --  0.6   ------------------------------------------------------------------------------------------------------------------ No results for input(s): CHOL, HDL, LDLCALC, TRIG, CHOLHDL, LDLDIRECT in the last 72 hours.  Lab Results  Component Value Date   HGBA1C 5.6 12/12/2017   ------------------------------------------------------------------------------------------------------------------ No results for  input(s): TSH, T4TOTAL, T3FREE, THYROIDAB in the last 72 hours.  Invalid input(s): FREET3 ------------------------------------------------------------------------------------------------------------------ No results for input(s): VITAMINB12, FOLATE, FERRITIN, TIBC, IRON, RETICCTPCT in the last 72 hours.  Coagulation profile Recent Labs  Lab 03/10/18 0315  INR 1.31    No results for input(s): DDIMER in the last 72 hours.  Cardiac Enzymes No results for input(s): CKMB, TROPONINI, MYOGLOBIN in the last 168 hours.  Invalid input(s): CK ------------------------------------------------------------------------------------------------------------------ No results found for: BNP   Roxan Hockey M.D on 03/15/2018 at 3:46 PM  Between 7am to 7pm - Pager - [phone]  After 7pm go to [email or web address] - password [email or web address]  [facility] -  Office  [phone]   Voice Recognition Viviann Spare dictation system was used to create this note, attempts have been made to correct errors. Please contact the author with questions and/or clarifications.

## 2018-03-16 LAB — CBC
HCT: 31.9 % — ABNORMAL LOW (ref 36.0–46.0)
HEMOGLOBIN: 10.2 g/dL — AB (ref 12.0–15.0)
MCH: 29.7 pg (ref 26.0–34.0)
MCHC: 32 g/dL (ref 30.0–36.0)
MCV: 93 fL (ref 78.0–100.0)
Platelets: 248 10*3/uL (ref 150–400)
RBC: 3.43 MIL/uL — AB (ref 3.87–5.11)
RDW: 16 % — ABNORMAL HIGH (ref 11.5–15.5)
WBC: 10 10*3/uL (ref 4.0–10.5)

## 2018-03-16 LAB — BASIC METABOLIC PANEL
Anion gap: 8 (ref 5–15)
BUN: 23 mg/dL — ABNORMAL HIGH (ref 6–20)
CHLORIDE: 102 mmol/L (ref 101–111)
CO2: 25 mmol/L (ref 22–32)
CREATININE: 1.36 mg/dL — AB (ref 0.44–1.00)
Calcium: 8.5 mg/dL — ABNORMAL LOW (ref 8.9–10.3)
GFR calc Af Amer: 41 mL/min — ABNORMAL LOW (ref >60)
GFR calc non Af Amer: 36 mL/min — ABNORMAL LOW (ref >60)
GLUCOSE: 98 mg/dL (ref 65–99)
POTASSIUM: 3.9 mmol/L (ref 3.5–5.1)
SODIUM: 135 mmol/L (ref 135–145)

## 2018-03-16 NOTE — Care Management Important Message (Signed)
Important Message  Patient Details  Name: Shelby Reese MRN: [id number] Date of Birth: 1935/10/23   Medicare Important Message Given:  Yes    [name] P [name] 03/16/2018, 3:01 PM

## 2018-03-16 NOTE — Progress Notes (Signed)
Physical Therapy Treatment Patient Details Name: Shelby Reese MRN: [id number] DOB: Mar 21, 1935 Today's Date: 03/16/2018    History of Present Illness Pt is an 83 y.o. female with PMH of PAD s/p R AKA (12/15/17), bilateral severe chronic venous stasis, HTN, admitted 02/23/18 with acute on chronic anemia with guaiac positive stools and AKI in the setting of worsening L foot ulcer and infection. Underwent CO2 angiography with subsequent CFA arthrectomy and stent placement on 4/16. Suspected GIB with melena; EGD and colonoscopy negative for active bleeding    PT Comments    Pt making slow progress. Pt engaged with treatment today.   Follow Up Recommendations  SNF     Equipment Recommendations  None recommended by PT    Recommendations for Other Services       Precautions / Restrictions Precautions Precautions: Fall Precaution Comments: recent R AKA (11/2017), L foot ulcer    Mobility  Bed Mobility Overal bed mobility: Needs Assistance Bed Mobility: Supine to Sit;Sit to Supine     Supine to sit: +2 for physical assistance;Max assist Sit to supine: +2 for physical assistance;Min assist   General bed mobility comments: Assist to bring leg off bed, elevate trunk into sitting, and bring hips to EOB. Assist to guide trunk down and bring LLE up into bed.  Transfers                 General transfer comment: NT  Ambulation/Gait                 Stairs             Wheelchair Mobility    Modified Rankin (Stroke Patients Only)       Balance Overall balance assessment: Needs assistance Sitting-balance support: Bilateral upper extremity supported;No upper extremity supported;Feet supported Sitting balance-Leahy Scale: Fair Sitting balance - Comments: Prefers to have UE support but is able to sit without UE support.                                    Cognition Arousal/Alertness: Awake/alert Behavior During Therapy: WFL for tasks  assessed/performed Overall Cognitive Status: Within Functional Limits for tasks assessed                                        Exercises General Exercises - Upper Extremity Shoulder Flexion: AAROM;Both;10 reps;Seated Shoulder Extension: AAROM;10 reps;Seated;Both Shoulder Horizontal ABduction: AAROM;Both;10 reps;Seated Shoulder Horizontal ADduction: AAROM;Both;10 reps;Seated General Exercises - Lower Extremity Long Arc Quad: AROM;Left;10 reps;Seated    General Comments        Pertinent Vitals/Pain Pain Assessment: Faces Faces Pain Scale: Hurts little more Pain Location: Left lower leg from knee to ankle Pain Descriptors / Indicators: Sore;Tender Pain Intervention(s): Limited activity within patient's tolerance;Monitored during session;Repositioned    Home Living                      Prior Function            PT Goals (current goals can now be found in the care plan section) Progress towards PT goals: Progressing toward goals    Frequency    Min 2X/week      PT Plan Current plan remains appropriate    Co-evaluation              AM-PAC  PT "6 Clicks" Daily Activity  Outcome Measure  Difficulty turning over in bed (including adjusting bedclothes, sheets and blankets)?: Unable Difficulty moving from lying on back to sitting on the side of the bed? : Unable Difficulty sitting down on and standing up from a chair with arms (e.g., wheelchair, bedside commode, etc,.)?: Unable Help needed moving to and from a bed to chair (including a wheelchair)?: Total Help needed walking in hospital room?: Total Help needed climbing 3-5 steps with a railing? : Total 6 Click Score: 6    End of Session   Activity Tolerance: Patient tolerated treatment well Patient left: in bed;with call bell/phone within reach   PT Visit Diagnosis: Other abnormalities of gait and mobility (R26.89);Muscle weakness (generalized) (M62.81);Pain Pain - Right/Left:  Left Pain - part of body: Ankle and joints of foot;Leg     Time: 8184-0375 PT Time Calculation (min) (ACUTE ONLY): 19 min  Charges:  $Therapeutic Activity: 8-22 mins                    G Codes:       [facility] PT [place] 03/16/2018, 4:01 PM

## 2018-03-16 NOTE — Progress Notes (Signed)
Patient Demographics:    Shelby Reese, is a 83 y.o. female, DOB - 22-Apr-1935, DGL:[id number]  Admit date - 02/23/2018   Admitting Physician [name], MD  Outpatient Primary MD for the patient is Shelby Pretty, MD  LOS - 21   Chief Complaint  Patient presents with  . Wound Infection        Subjective:    Shelby Reese today has no fevers, no emesis,  No chest pain,   no BM, no abdominal pain  Assessment  & Plan :    Principal Problem:   Shocks, hemorrhagic (HCC) Active Problems:   Hypothyroidism, acquired   Iron deficiency anemia   Atherosclerosis of artery of right lower extremity (HCC)   Essential hypertension   CKD (chronic kidney disease), stage III (HCC)   Peripheral vascular disease (HCC)   Peripheral vascular disease of lower extremity with ulceration (HCC)   Anemia   Foot ulcer ([place])   Arterioloscleroses   Acute lower UTI   AKI (acute kidney injury) ([place])   Melena   Fecal occult blood test positive   Hypervolemia   GI bleed   Plan:- 1)Acute blood loss anemia with hemorrhagic shock---- Now resolved.-Hemoglobin is up to 10.2  , EGD and colonoscopy without any identifiable source of bleeding, monitor CBC and transfuse as clinically indicated  2)AKI---- improving, AKI was due to prerenal azotemia in the setting of acute GI bleed-     creatinine on admission=2.30  ,   baseline creatinine = 1.0   , creatinine is now= 1.36    ,  Avoid nephrotoxic agents/dehydration/hypotension   3) ischemic ulceration of left foot. s/p left CFA atherectomy and PTA, SFA atherectomy and stent  Placement on 03/03/2018 -per GI service  restarted aspirin,  continue Lipitor  4)HTN-continue benazepril 40 mg daily , as well as hydralazine 50 mg 3 times daily  5)Hypothyroidism-continue levothyroxine 175 mcg daily  6)Disposition-physical therapy recommended skilled nursing facility placement, patient  reluctant to go to skilled nursing facility, ??? SNF Vs Home with [place].... Patient will talk to her husband  Code Status : Full code  Consults  :  Gi  DVT Prophylaxis  :  SCDs   Lab Results  Component Value Date   PLT 248 03/16/2018    Inpatient Medications  Scheduled Meds: . amitriptyline  50 mg Oral QHS  . aspirin  81 mg Oral Daily  . atorvastatin  10 mg Oral q1800  . benazepril  40 mg Oral Daily  . calcium-vitamin D  1 tablet Oral Q breakfast  . chlorhexidine  15 mL Mouth Rinse BID  . escitalopram  10 mg Oral Daily  . feeding supplement (ENSURE ENLIVE)  237 mL Oral TID WC  . fentaNYL  25 mcg Transdermal Q72H  . hydrALAZINE  50 mg Oral TID  . hydrocortisone cream   Topical BID  . levothyroxine  175 mcg Oral QAC breakfast  . loratadine  10 mg Oral Daily  . mouth rinse  15 mL Mouth Rinse q12n4p  . multivitamin with minerals  1 tablet Oral Daily  . pantoprazole  40 mg Oral Daily  . sodium chloride flush  3 mL Intravenous Q12H  . vitamin C  500 mg Oral Daily   Continuous Infusions: .  sodium chloride    . sodium chloride    . sodium chloride    . sodium chloride     PRN Meds:.sodium chloride, Place/Maintain arterial line **AND** sodium chloride, acetaminophen, hydrALAZINE, LORazepam, magnesium citrate, methocarbamol, ondansetron (ZOFRAN) IV, oxyCODONE, sodium chloride flush    Anti-infectives (From admission, onward)   Start     Dose/Rate Route Frequency Ordered Stop   02/24/18 1400  aztreonam (AZACTAM) 1 g in sodium chloride 0.9 % 100 mL IVPB  Status:  Discontinued     1 g 200 mL/hr over 30 Minutes Intravenous Every 8 hours 02/24/18 1001 02/24/18 1010   02/24/18 1200  cefTRIAXone (ROCEPHIN) 2 g in sodium chloride 0.9 % 100 mL IVPB  Status:  Discontinued     2 g 200 mL/hr over 30 Minutes Intravenous Daily 02/24/18 1012 03/05/18 0806   02/24/18 0400  ceFAZolin (ANCEF) IVPB 1 g/50 mL premix  Status:  Discontinued     1 g 100 mL/hr over 30 Minutes Intravenous Every 12  hours 02/23/18 2145 02/24/18 1001   02/24/18 0000  clindamycin (CLEOCIN) IVPB 600 mg  Status:  Discontinued     600 mg 100 mL/hr over 30 Minutes Intravenous Every 8 hours 02/23/18 2113 03/04/18 1003   02/23/18 1600  clindamycin (CLEOCIN) IVPB 600 mg     600 mg 100 mL/hr over 30 Minutes Intravenous  Once 02/23/18 1553 02/23/18 1804   02/23/18 1600  ceFAZolin (ANCEF) IVPB 1 g/50 mL premix     1 g 100 mL/hr over 30 Minutes Intravenous  Once 02/23/18 1553 02/23/18 1709        Objective:   Vitals:   03/16/18 0850 03/16/18 1021 03/16/18 1300 03/16/18 1600  BP: (!) 179/59  (!) 122/41 (!) 122/44  Pulse:  89  86  Resp:  13  15  Temp:  98.9 F (37.2 C) 99 F (37.2 C) 99 F (37.2 C)  TempSrc:  Oral Oral Oral  SpO2:  97%  94%  Weight:      Height:        Wt Readings from Last 3 Encounters:  03/16/18 91 kg (200 lb 9.9 oz)  02/16/18 84.9 kg (187 lb 3.2 oz)  02/02/18 87.5 kg (192 lb 12.8 oz)     Intake/Output Summary (Last 24 hours) at 03/16/2018 1830 Last data filed at 03/16/2018 1300 Gross per 24 hour  Intake 480 ml  Output 700 ml  Net -220 ml     Physical Exam  Gen:- Awake Alert,  In no apparent distress  HEENT:- [place].AT, No sclera icterus Neck-Supple Neck,No JVD,.  Lungs-  CTAB , good air movement CV- S1, S2 normal, 3/6 SM,  Abd-  +ve B.Sounds, Abd Soft, No tenderness,    Extremity/Skin:-   Rt AKA, left leg with dressing on  psych-affect is appropriate, oriented x3 Neuro-no new focal deficits, no tremors   Data Review:   Micro Results Recent Results (from the past 240 hour(s))  MRSA PCR Screening     Status: None   Collection Time: 03/09/18 11:55 PM  Result Value Ref Range Status   MRSA by PCR NEGATIVE NEGATIVE Final    Comment:        The GeneXpert MRSA Assay (FDA approved for NASAL specimens only), is one component of a comprehensive MRSA colonization surveillance program. It is not intended to diagnose MRSA infection nor to guide or monitor treatment  for MRSA infections. Performed at [facility], [place] [address] [place], [place] [address]  Culture, blood (routine x 2)     Status: None   Collection Time: 03/10/18  6:36 PM  Result Value Ref Range Status   Specimen Description BLOOD RIGHT HAND  Final   Special Requests   Final    BOTTLES DRAWN AEROBIC ONLY Blood Culture adequate volume   Culture   Final    NO GROWTH 5 DAYS Performed at [facility], [address] [place], [place] [address]    Report Status 03/15/2018 FINAL  Final  Culture, blood (routine x 2)     Status: None   Collection Time: 03/10/18  6:36 PM  Result Value Ref Range Status   Specimen Description BLOOD RIGHT ANTECUBITAL  Final   Special Requests   Final    BOTTLES DRAWN AEROBIC ONLY Blood Culture adequate volume   Culture   Final    NO GROWTH 5 DAYS Performed at [facility], [place] [address] [place],  [address]    Report Status 03/15/2018 FINAL  Final    Radiology Reports US Renal  Result Date: 02/25/2018 CLINICAL DATA:  Acute kidney injury, history hypertension, stage III chronic kidney disease, former smoker EXAM: RENAL / URINARY TRACT ULTRASOUND COMPLETE COMPARISON:  None FINDINGS: Right Kidney: Length: 9.9 cm. Minimal cortical thinning. Normal cortical echogenicity. No mass, hydronephrosis or shadowing calcification. Left Kidney: Length: 9.6 cm. Minimal cortical thinning. Normal cortical echogenicity. No mass, hydronephrosis or shadowing calcification. Bladder: Echogenic debris dependently within urinary bladder. No gross bladder wall thickening/mass. IMPRESSION: Unremarkable sonographic appearance of the kidneys for age. Significant echogenic debris dependently within urinary bladder. Electronically Signed   By: Lavonia Dana M.D.   On: 02/25/2018 10:56   Dg Chest Port 1 View  Result Date: 03/13/2018 CLINICAL DATA:  Short of breath.  Colonoscopy yesterday EXAM: PORTABLE CHEST 1 VIEW COMPARISON:  03/09/2018 FINDINGS:  Hypoventilation with mild bibasilar atelectasis/infiltrate. Negative for heart failure or effusion Left subclavian central venous catheter tip in the SVC unchanged. No pneumothorax. IMPRESSION: Progression of mild bibasilar atelectasis/infiltrate. Electronically Signed   By: Franchot Gallo M.D.   On: 03/13/2018 09:26   Dg Chest Port 1 View  Result Date: 03/09/2018 CLINICAL DATA:  Central line placement. EXAM: PORTABLE CHEST 1 VIEW COMPARISON:  03/07/2018. FINDINGS: Low lung volumes. LEFT subclavian line has been placed. Tip lies at the cavoatrial junction. There is no pneumothorax. Slight improvement aeration. Some clearing of edema. Low lung volumes with scarring on the LEFT appears stable. IMPRESSION: LEFT subclavian line placement, satisfactory position cavoatrial junction. No pneumothorax. Electronically Signed   By: Staci Righter M.D.   On: 03/09/2018 19:42   Dg Chest Port 1 View  Result Date: 03/07/2018 CLINICAL DATA:  Shortness of breath and chest pain for 1 day. EXAM: PORTABLE CHEST 1 VIEW COMPARISON:  Chest radiograph August 29, 2017 FINDINGS: Cardiac silhouette is normal in size. Calcified aortic knob. Worsening interstitial prominence. Bandlike density LEFT lower lung zone. Kerley B-lines. No pleural effusion or focal consolidation. No pneumothorax. Osteopenia and dextroscoliosis. IMPRESSION: Worsening interstitial prominence seen with pulmonary edema and/or atypical infection. LEFT lung base atelectasis/scarring. Aortic Atherosclerosis (ICD10-I70.0). Electronically Signed   By: Elon Alas M.D.   On: 03/07/2018 15:06   Dg Foot Complete Left  Result Date: 02/23/2018 CLINICAL DATA:  Left foot redness, pain and swelling. Question osteomyelitis. EXAM: LEFT FOOT - COMPLETE 3+ VIEW COMPARISON:  None. FINDINGS: There appear to be skin wounds over the dorsum of the foot centered at the level of the cuneiforms, neck of  the talus and tibiotalar joint. Lucencies are identified in the bones the  midfoot, most conspicuous in the cuboid and lateral cuneiform as well as the fourth metatarsal. No fracture. Atherosclerosis noted. No soft tissue gas or radiopaque foreign body. IMPRESSION: Lucencies in the bones of the midfoot are nonspecific and could be due to early neuropathic change or osteomyelitis. Skin wounds on the dorsum of the foot. Electronically Signed   By: Inge Rise M.D.   On: 02/23/2018 15:55     CBC Recent Labs  Lab 03/12/18 0418 03/12/18 1045 03/12/18 1635 03/12/18 2333 03/13/18 0400 03/14/18 0231 03/16/18 0242  WBC 9.1 8.4 8.3 9.8 9.7 9.9 10.0  HGB 8.6* 9.3* 8.6* 8.6* 8.7* 9.1* 10.2*  HCT 25.2* 27.8* 25.9* 26.4* 26.8* 27.9* 31.9*  PLT 137* 146* 152 175 173 209 248  MCV 89.4 90.0 90.9 92.0 92.4 92.7 93.0  MCH 30.5 30.1 30.2 30.0 30.0 30.2 29.7  MCHC 34.1 33.5 33.2 32.6 32.5 32.6 32.0  RDW 15.3 15.5 15.7* 15.6* 15.8* 16.0* 16.0*  LYMPHSABS 2.7 2.0 1.9 2.0 2.2  --   --   MONOABS 0.8 0.9 0.7 0.9 0.8  --   --   EOSABS 0.3 0.3 0.4 0.4 0.3  --   --   BASOSABS 0.0 0.0 0.0 0.0 0.0  --   --     Chemistries  Recent Labs  Lab 03/10/18 0315 03/11/18 0624 03/12/18 0418 03/13/18 0400 03/16/18 0242  NA 135 135 135 137 135  K 4.7 4.6 3.3* 4.0 3.9  CL 104 103 104 107 102  CO2 22 20* 23 23 25   GLUCOSE 223* 121* 74 94 98  BUN 34* 39* 31* 27* 23*  CREATININE 2.11* 2.09* 1.65* 1.48* 1.36*  CALCIUM 7.6* 7.6* 7.7* 8.0* 8.5*  MG  --  1.6*  --   --   --   AST  --   --   --  18  --   ALT  --   --   --  16  --   ALKPHOS  --   --   --  33*  --   BILITOT  --   --   --  0.6  --    ------------------------------------------------------------------------------------------------------------------ No results for input(s): CHOL, HDL, LDLCALC, TRIG, CHOLHDL, LDLDIRECT in the last 72 hours.  Lab Results  Component Value Date   HGBA1C 5.6 12/12/2017   ------------------------------------------------------------------------------------------------------------------ No  results for input(s): TSH, T4TOTAL, T3FREE, THYROIDAB in the last 72 hours.  Invalid input(s): FREET3 ------------------------------------------------------------------------------------------------------------------ No results for input(s): VITAMINB12, FOLATE, FERRITIN, TIBC, IRON, RETICCTPCT in the last 72 hours.  Coagulation profile Recent Labs  Lab 03/10/18 0315  INR 1.31    No results for input(s): DDIMER in the last 72 hours.  Cardiac Enzymes No results for input(s): CKMB, TROPONINI, MYOGLOBIN in the last 168 hours.  Invalid input(s): CK ------------------------------------------------------------------------------------------------------------------ No results found for: BNP   Roxan Hockey M.D on 03/16/2018 at 6:30 PM  Between 7am to 7pm - Pager - [phone]  After 7pm go to [email or web address] - password [email or web address]  [facility] -  Office  [phone]   Voice Recognition Viviann Spare dictation system was used to create this note, attempts have been made to correct errors. Please contact the author with questions and/or clarifications.

## 2018-03-16 NOTE — Progress Notes (Signed)
Clinical Social Worker following patient for support and discharge need. CSW met patient at bedside to discuss discharge plan. Prior to coming into the hospital patient came from [facility] and patient stated she does not want to return to facility. Patient also stated that she does not want to go to a SNF for short term rehab. Per patient she feels that short term rehab has very minimal rehab and does not feel that it would benefit her. Patient stated she would like to discharge back home with the support of her husband. Patient stated she has a home health company that can come into the home to assist her with her needs (pt stated she is willing to pay out of pocket if needed). CSW spoke to [facility] on the floor to make her aware of patients decision. CSW signing off as patient no longer has social work needs. If any new needs arise please consult social work again.   Rhea Pink, MSW,  [place]

## 2018-03-17 ENCOUNTER — Other Ambulatory Visit: Payer: Self-pay

## 2018-03-17 DIAGNOSIS — Z89611 Acquired absence of right leg above knee: Secondary | ICD-10-CM

## 2018-03-17 DIAGNOSIS — I739 Peripheral vascular disease, unspecified: Secondary | ICD-10-CM

## 2018-03-17 DIAGNOSIS — I779 Disorder of arteries and arterioles, unspecified: Secondary | ICD-10-CM

## 2018-03-17 LAB — URINALYSIS, ROUTINE W REFLEX MICROSCOPIC
Bilirubin Urine: NEGATIVE
GLUCOSE, UA: NEGATIVE mg/dL
Hgb urine dipstick: NEGATIVE
KETONES UR: NEGATIVE mg/dL
NITRITE: NEGATIVE
PROTEIN: 30 mg/dL — AB
Specific Gravity, Urine: 1.01 (ref 1.005–1.030)
pH: 8 (ref 5.0–8.0)

## 2018-03-17 LAB — CBC
HEMATOCRIT: 29.3 % — AB (ref 36.0–46.0)
HEMOGLOBIN: 9.2 g/dL — AB (ref 12.0–15.0)
MCH: 29.3 pg (ref 26.0–34.0)
MCHC: 31.4 g/dL (ref 30.0–36.0)
MCV: 93.3 fL (ref 78.0–100.0)
Platelets: 321 10*3/uL (ref 150–400)
RBC: 3.14 MIL/uL — AB (ref 3.87–5.11)
RDW: 15.7 % — ABNORMAL HIGH (ref 11.5–15.5)
WBC: 7.5 10*3/uL (ref 4.0–10.5)

## 2018-03-17 LAB — BASIC METABOLIC PANEL
ANION GAP: 8 (ref 5–15)
BUN: 20 mg/dL (ref 6–20)
CHLORIDE: 101 mmol/L (ref 101–111)
CO2: 26 mmol/L (ref 22–32)
Calcium: 8.7 mg/dL — ABNORMAL LOW (ref 8.9–10.3)
Creatinine, Ser: 1.32 mg/dL — ABNORMAL HIGH (ref 0.44–1.00)
GFR calc non Af Amer: 37 mL/min — ABNORMAL LOW (ref >60)
GFR, EST AFRICAN AMERICAN: 43 mL/min — AB (ref >60)
Glucose, Bld: 103 mg/dL — ABNORMAL HIGH (ref 65–99)
POTASSIUM: 3.8 mmol/L (ref 3.5–5.1)
SODIUM: 135 mmol/L (ref 135–145)

## 2018-03-17 NOTE — Progress Notes (Signed)
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  A cane, crutch or walker will not resolve  issue with performing activities of daily living. A wheelchair will allow patient to safely perform daily activities. Patient can safely propel the wheelchair in the home or has a caregiver who can provide assistance.  Accessories: elevating leg rests (ELRs), wheel locks, extensions and anti-tippers.   03/17/18 [place]

## 2018-03-17 NOTE — Consult Note (Addendum)
   [facility] CM Inpatient Consult   03/17/2018  XAN SPARKMAN [birth year] [phone]    [facility] Care Management follow up.   Spoke with inpatient RNCM prior to bedside visit. Mrs. Slaubaugh has decided not to go to SNF. She wants to go home with home health services. Per inpatient RNCM, Mrs. Kessinger will have [place].  Spoke with Mrs. Maggio at bedside about [place] Management follow up. Made Mrs. Mowbray aware that [facility] NP will follow up with her post hospital discharge. Mrs. Schmelzer is agreeable to [place] Management services. [facility] Care Management active consent remains on file. Patient also endorses she will need assistance with transportation to MD appointments.   Mrs. Stiverson reiterated she will not go to SNF. States " it will be a waste of time".   Mrs Giannetti will discharge home to live with husband despite SNF recommendations.  States she will have [employer] private duty agency to fill in on days she does not have [place] home health coming out to her home. Confirms Primary Care MD is Dr. She[facility] is listed as doing post hospital discharge calls). Mrs. Marte states the best contact number for her is her home number at [phone].  Left [facility] Care Management brochure with contact information.   Made inpatient RNCM aware [facility] Care Management will follow.   Will make referral to [facility] LCSW as well for transportation assistance.  Will update [facility] NP.    Marthenia Rolling, MSN-Ed, RN,BSN [facility] Liaison [phone]

## 2018-03-17 NOTE — Care Management Note (Signed)
Case Management Note  Patient Details  Name: Shelby Reese MRN: [id number] Date of Birth: [birth year]  Subjective/Objective:                    Action/Plan: Patient from [place] at [place], active with [facility] . Will continue to follow for discharge needs. If patient needs home health at discharge will need new orders and face to face  Expected Discharge Date:                  Expected Discharge Plan:  Assisted Living / Rest Home  In-House Referral:  Clinical Social Work  Discharge planning Services  CM Consult  Post Acute Care Choice:  Home Health, Resumption of Svcs/PTA Provider, Durable Medical Equipment(Active w/[facility] HHC.[facility] following.) Choice offered to:  Patient  DME Arranged:  Programmer, multimedia DME Agency:  [facility] Arranged:  RN, PT, OT, Nurse's Aide, Social Work, Refused SNF [place] Agency:  [facility] [place]  Status of Service:  Completed, signed off  If discussed at [employer], dates discussed:  4/28, 4/30  Discharge Disposition: home/home health   Additional Comments:  03/17/18- 1145- Cayleb Jarnigan RN, CM- spoke with pt and CM with [place] in home caregiver services at the bedside for transition needs- per pt MD has told her she is medically ready for discharge- pt is declining to return to SNF and wants to return home with spouse- per conversation pt wants to stay with [facility] for [facility] services (has been made [place]) orders have been placed for HHRN/PT/OT/aide/SW-  Discussed wound care- will f/u with MD for needed wound care orders at discharge. Also discussed DME needs- pt declining hoyer lift states she will plan to use slide board- states she wants a w/c for home- and will need transportation assistance home- will plan on [facility] (non emergent EMS)- have spoken with MD for w/c order- and call made to Butch Penny with [facility]-  Update: 1600- per Butch Penny with [facility] pt already had hospital bed and w/c that is showing in the [facility]  system as being delivered- [facility] is unable to bill insurance again for this and will need to speak with pt regarding payment options for another w/c- have notified MD that pt will need to plan for d/c in am in order to resolve issue with w/c and safely transition pt home. CM will f/u with pt and [facility] in am for plan regarding w/c and then finial transition arrangements with pt. Pt will be followed by [facility] also at discharge.   Dawayne Patricia, RN 03/17/2018, 4:38 PM

## 2018-03-17 NOTE — Progress Notes (Addendum)
Patient Demographics:    Shelby Reese, is a 83 y.o. female, DOB - 1935/08/01, OJJ:[id number]  Admit date - 02/23/2018   Admitting Physician [name], MD  Outpatient Primary MD for the patient is [name], MD  LOS - 65   Chief Complaint  Patient presents with  . Wound Infection        Subjective:    [name] today has no fevers, no emesis,  No chest pain,   no BM, no abdominal pain  Assessment  & Plan :    Principal Problem:   Shocks, hemorrhagic (HCC) Active Problems:   Hypothyroidism, acquired   Iron deficiency anemia   Atherosclerosis of artery of right lower extremity (HCC)   Essential hypertension   CKD (chronic kidney disease), stage III (HCC)   Peripheral vascular disease (HCC)   Peripheral vascular disease of lower extremity with ulceration (HCC)   Anemia   Foot ulcer ([place])   Arterioloscleroses   Acute lower UTI   AKI (acute kidney injury) ([place])   Melena   Fecal occult blood test positive   Hypervolemia   GI bleed  Brief summary Shelby Reese  is a 83 y.o. female, with past medical history significant for peripheral vascular disease status post right above-knee amputation presenting with worsening ulcer of her left foot which did not improve on outpatient therapy with doxycycline.  Patient reports trauma in June for dorsal aspect of her right foot and the wound has been getting worse.  Patient denies any fever or chills.  Patient is a retired Therapist, sports and lives usually at home with her husband that she usually takes care of .  In the emergency room the patient was noted to be anemic with guaiac positive stools and had acute kidney injury with creatinine up from 0.93 to2.30 .  Patient was started on clindamycin and Ancef after discussion with pharmacy due to multiple allergies. PCCM TRANSFER for 03/14/18, plan is for d/c home with [facility] on 03/18/18    Plan:- 1)Acute blood loss anemia  with hemorrhagic shock---- Now resolved.-Hemoglobin is up to 9.2  , EGD and colonoscopy without any identifiable source of bleeding, monitor CBC and transfuse as clinically indicated  2)AKI---- improving, AKI was due to prerenal azotemia in the setting of acute GI bleed-     creatinine on admission=2.30  ,   baseline creatinine = 1.0   , creatinine is now= 1.3    ,  Avoid nephrotoxic agents/dehydration/hypotension   3) ischemic ulceration of left foot. s/p left CFA atherectomy and PTA, SFA atherectomy and stent  Placement on 03/03/2018 -per GI service  restarted aspirin,  continue Lipitor  4)HTN-continue benazepril 40 mg daily , c/n  hydralazine 50 mg 3 times daily  5)Hypothyroidism-continue levothyroxine 175 mcg daily  6) left leg wounds-clean wound couple times a day with saline, apply Xeroform to the wound bed, then applied gauze dressings, recommend outpatient follow-up with vascular surgeon and wound care specialist  7)Disposition-physical therapy recommended skilled nursing facility placement, patient reluctant to go to skilled nursing facility, Patient and her husband have declined skilled nursing facility, they have also declined assisted living facility, patient plans to return home on 03/18/2018 with home health services once hospital bed and other equipment can be  delivered to the home, case manager working to get equipment to patient's house on 03/18/2018    Code Status : Full code  Consults  :  Gi  DVT Prophylaxis  :  SCDs   Lab Results  Component Value Date   PLT 321 03/17/2018    Inpatient Medications  Scheduled Meds: . amitriptyline  50 mg Oral QHS  . aspirin  81 mg Oral Daily  . atorvastatin  10 mg Oral q1800  . benazepril  40 mg Oral Daily  . calcium-vitamin D  1 tablet Oral Q breakfast  . chlorhexidine  15 mL Mouth Rinse BID  . escitalopram  10 mg Oral Daily  . feeding supplement (ENSURE ENLIVE)  237 mL Oral TID WC  . fentaNYL  25 mcg Transdermal Q72H  .  hydrALAZINE  50 mg Oral TID  . hydrocortisone cream   Topical BID  . levothyroxine  175 mcg Oral QAC breakfast  . loratadine  10 mg Oral Daily  . mouth rinse  15 mL Mouth Rinse q12n4p  . multivitamin with minerals  1 tablet Oral Daily  . pantoprazole  40 mg Oral Daily  . sodium chloride flush  3 mL Intravenous Q12H  . vitamin C  500 mg Oral Daily   Continuous Infusions: . sodium chloride    . sodium chloride    . sodium chloride    . sodium chloride     PRN Meds:.sodium chloride, Place/Maintain arterial line **AND** sodium chloride, acetaminophen, hydrALAZINE, LORazepam, magnesium citrate, methocarbamol, ondansetron (ZOFRAN) IV, oxyCODONE, sodium chloride flush    Anti-infectives (From admission, onward)   Start     Dose/Rate Route Frequency Ordered Stop   02/24/18 1400  aztreonam (AZACTAM) 1 g in sodium chloride 0.9 % 100 mL IVPB  Status:  Discontinued     1 g 200 mL/hr over 30 Minutes Intravenous Every 8 hours 02/24/18 1001 02/24/18 1010   02/24/18 1200  cefTRIAXone (ROCEPHIN) 2 g in sodium chloride 0.9 % 100 mL IVPB  Status:  Discontinued     2 g 200 mL/hr over 30 Minutes Intravenous Daily 02/24/18 1012 03/05/18 0806   02/24/18 0400  ceFAZolin (ANCEF) IVPB 1 g/50 mL premix  Status:  Discontinued     1 g 100 mL/hr over 30 Minutes Intravenous Every 12 hours 02/23/18 2145 02/24/18 1001   02/24/18 0000  clindamycin (CLEOCIN) IVPB 600 mg  Status:  Discontinued     600 mg 100 mL/hr over 30 Minutes Intravenous Every 8 hours 02/23/18 2113 03/04/18 1003   02/23/18 1600  clindamycin (CLEOCIN) IVPB 600 mg     600 mg 100 mL/hr over 30 Minutes Intravenous  Once 02/23/18 1553 02/23/18 1804   02/23/18 1600  ceFAZolin (ANCEF) IVPB 1 g/50 mL premix     1 g 100 mL/hr over 30 Minutes Intravenous  Once 02/23/18 1553 02/23/18 1709        Objective:   Vitals:   03/17/18 1259 03/17/18 1648 03/17/18 1758 03/17/18 1958  BP: (!) 148/49 (!) 126/23 (!) 154/54 (!) 143/53  Pulse: 85 87 85 76    Resp: 18 18 14 12   Temp: 97.9 F (36.6 C)   98.8 F (37.1 C)  TempSrc: Oral   Oral  SpO2: 94% 95% 97% 96%  Weight:      Height:        Wt Readings from Last 3 Encounters:  03/17/18 92.6 kg (204 lb 2.3 oz)  02/16/18 84.9 kg (187 lb 3.2 oz)  02/02/18 87.5 kg (  192 lb 12.8 oz)     Intake/Output Summary (Last 24 hours) at 03/17/2018 2015 Last data filed at 03/17/2018 1648 Gross per 24 hour  Intake 480 ml  Output 2850 ml  Net -2370 ml     Physical Exam  Gen:- Awake Alert,  In no apparent distress  HEENT:- [place].AT, No sclera icterus Neck-Supple Neck,No JVD,.  Lungs-  CTAB , good air movement CV- S1, S2 normal, 3/6 SM,  Abd-  +ve B.Sounds, Abd Soft, No tenderness,    Extremity/Skin:-   Rt AKA, left leg with dressing on (xeroform dressing with gauze),   psych-affect is appropriate, oriented x3 Neuro-no new focal deficits, no tremors   Data Review:   Micro Results Recent Results (from the past 240 hour(s))  MRSA PCR Screening     Status: None   Collection Time: 03/09/18 11:55 PM  Result Value Ref Range Status   MRSA by PCR NEGATIVE NEGATIVE Final    Comment:        The GeneXpert MRSA Assay (FDA approved for NASAL specimens only), is one component of a comprehensive MRSA colonization surveillance program. It is not intended to diagnose MRSA infection nor to guide or monitor treatment for MRSA infections. Performed at [facility], [place] [address], [place], [place] [address]   Culture, blood (routine x 2)     Status: None   Collection Time: 03/10/18  6:36 PM  Result Value Ref Range Status   Specimen Description BLOOD RIGHT HAND  Final   Special Requests   Final    BOTTLES DRAWN AEROBIC ONLY Blood Culture adequate volume   Culture   Final    NO GROWTH 5 DAYS Performed at [facility], [place] [address], [place], [place] [address]    Report Status 03/15/2018 FINAL  Final  Culture, blood (routine x 2)     Status: None   Collection Time: 03/10/18  6:36 PM   Result Value Ref Range Status   Specimen Description BLOOD RIGHT ANTECUBITAL  Final   Special Requests   Final    BOTTLES DRAWN AEROBIC ONLY Blood Culture adequate volume   Culture   Final    NO GROWTH 5 DAYS Performed at [facility], [place] [address] [place], [place] [address]    Report Status 03/15/2018 FINAL  Final    Radiology Reports US Renal  Result Date: 02/25/2018 CLINICAL DATA:  Acute kidney injury, history hypertension, stage III chronic kidney disease, former smoker EXAM: RENAL / URINARY TRACT ULTRASOUND COMPLETE COMPARISON:  None FINDINGS: Right Kidney: Length: 9.9 cm. Minimal cortical thinning. Normal cortical echogenicity. No mass, hydronephrosis or shadowing calcification. Left Kidney: Length: 9.6 cm. Minimal cortical thinning. Normal cortical echogenicity. No mass, hydronephrosis or shadowing calcification. Bladder: Echogenic debris dependently within urinary bladder. No gross bladder wall thickening/mass. IMPRESSION: Unremarkable sonographic appearance of the kidneys for age. Significant echogenic debris dependently within urinary bladder. Electronically Signed   By: Lavonia Dana M.D.   On: 02/25/2018 10:56   Dg Chest Port 1 View  Result Date: 03/13/2018 CLINICAL DATA:  Short of breath.  Colonoscopy yesterday EXAM: PORTABLE CHEST 1 VIEW COMPARISON:  03/09/2018 FINDINGS: Hypoventilation with mild bibasilar atelectasis/infiltrate. Negative for heart failure or effusion Left subclavian central venous catheter tip in the SVC unchanged. No pneumothorax. IMPRESSION: Progression of mild bibasilar atelectasis/infiltrate. Electronically Signed   By: Franchot Gallo M.D.   On: 03/13/2018 09:26   Dg Chest Port 1 View  Result Date: 03/09/2018 CLINICAL DATA:  Central  line placement. EXAM: PORTABLE CHEST 1 VIEW COMPARISON:  03/07/2018. FINDINGS: Low lung volumes. LEFT subclavian line has been placed. Tip lies at the cavoatrial junction. There is no pneumothorax. Slight improvement  aeration. Some clearing of edema. Low lung volumes with scarring on the LEFT appears stable. IMPRESSION: LEFT subclavian line placement, satisfactory position cavoatrial junction. No pneumothorax. Electronically Signed   By: Staci Righter M.D.   On: 03/09/2018 19:42   Dg Chest Port 1 View  Result Date: 03/07/2018 CLINICAL DATA:  Shortness of breath and chest pain for 1 day. EXAM: PORTABLE CHEST 1 VIEW COMPARISON:  Chest radiograph August 29, 2017 FINDINGS: Cardiac silhouette is normal in size. Calcified aortic knob. Worsening interstitial prominence. Bandlike density LEFT lower lung zone. Kerley B-lines. No pleural effusion or focal consolidation. No pneumothorax. Osteopenia and dextroscoliosis. IMPRESSION: Worsening interstitial prominence seen with pulmonary edema and/or atypical infection. LEFT lung base atelectasis/scarring. Aortic Atherosclerosis (ICD10-I70.0). Electronically Signed   By: Elon Alas M.D.   On: 03/07/2018 15:06   Dg Foot Complete Left  Result Date: 02/23/2018 CLINICAL DATA:  Left foot redness, pain and swelling. Question osteomyelitis. EXAM: LEFT FOOT - COMPLETE 3+ VIEW COMPARISON:  None. FINDINGS: There appear to be skin wounds over the dorsum of the foot centered at the level of the cuneiforms, neck of the talus and tibiotalar joint. Lucencies are identified in the bones the midfoot, most conspicuous in the cuboid and lateral cuneiform as well as the fourth metatarsal. No fracture. Atherosclerosis noted. No soft tissue gas or radiopaque foreign body. IMPRESSION: Lucencies in the bones of the midfoot are nonspecific and could be due to early neuropathic change or osteomyelitis. Skin wounds on the dorsum of the foot. Electronically Signed   By: Inge Rise M.D.   On: 02/23/2018 15:55     CBC Recent Labs  Lab 03/12/18 0418 03/12/18 1045 03/12/18 1635 03/12/18 2333 03/13/18 0400 03/14/18 0231 03/16/18 0242 03/17/18 1046  WBC 9.1 8.4 8.3 9.8 9.7 9.9 10.0 7.5  HGB  8.6* 9.3* 8.6* 8.6* 8.7* 9.1* 10.2* 9.2*  HCT 25.2* 27.8* 25.9* 26.4* 26.8* 27.9* 31.9* 29.3*  PLT 137* 146* 152 175 173 209 248 321  MCV 89.4 90.0 90.9 92.0 92.4 92.7 93.0 93.3  MCH 30.5 30.1 30.2 30.0 30.0 30.2 29.7 29.3  MCHC 34.1 33.5 33.2 32.6 32.5 32.6 32.0 31.4  RDW 15.3 15.5 15.7* 15.6* 15.8* 16.0* 16.0* 15.7*  LYMPHSABS 2.7 2.0 1.9 2.0 2.2  --   --   --   MONOABS 0.8 0.9 0.7 0.9 0.8  --   --   --   EOSABS 0.3 0.3 0.4 0.4 0.3  --   --   --   BASOSABS 0.0 0.0 0.0 0.0 0.0  --   --   --     Chemistries  Recent Labs  Lab 03/11/18 0624 03/12/18 0418 03/13/18 0400 03/16/18 0242 03/17/18 1046  NA 135 135 137 135 135  K 4.6 3.3* 4.0 3.9 3.8  CL 103 104 107 102 101  CO2 20* 23 23 25 26   GLUCOSE 121* 74 94 98 103*  BUN 39* 31* 27* 23* 20  CREATININE 2.09* 1.65* 1.48* 1.36* 1.32*  CALCIUM 7.6* 7.7* 8.0* 8.5* 8.7*  MG 1.6*  --   --   --   --   AST  --   --  18  --   --   ALT  --   --  16  --   --   ALKPHOS  --   --  33*  --   --   BILITOT  --   --  0.6  --   --    ------------------------------------------------------------------------------------------------------------------ No results for input(s): CHOL, HDL, LDLCALC, TRIG, CHOLHDL, LDLDIRECT in the last 72 hours.  Lab Results  Component Value Date   HGBA1C 5.6 12/12/2017   ------------------------------------------------------------------------------------------------------------------ No results for input(s): TSH, T4TOTAL, T3FREE, THYROIDAB in the last 72 hours.  Invalid input(s): FREET3 ------------------------------------------------------------------------------------------------------------------ No results for input(s): VITAMINB12, FOLATE, FERRITIN, TIBC, IRON, RETICCTPCT in the last 72 hours.  Coagulation profile No results for input(s): INR, PROTIME in the last 168 hours.  No results for input(s): DDIMER in the last 72 hours.  Cardiac Enzymes No results for input(s): CKMB, TROPONINI, MYOGLOBIN in the  last 168 hours.  Invalid input(s): CK ------------------------------------------------------------------------------------------------------------------ No results found for: BNP   Roxan Hockey M.D on 03/17/2018 at 8:15 PM  Between 7am to 7pm - Pager - [phone]  After 7pm go to [email or web address] - password [email or web address]  [facility] -  Office  [phone]   Voice Recognition Viviann Spare dictation system was used to create this note, attempts have been made to correct errors. Please contact the author with questions and/or clarifications.

## 2018-03-17 NOTE — Progress Notes (Addendum)
Nutrition Follow Up  DOCUMENTATION CODES:   Obesity unspecified  INTERVENTION:    Continue Ensure Enlive po TID, each supplement provides 350 kcal and 20 grams of protein  NUTRITION DIAGNOSIS:   Increased nutrient needs related to wound healing as evidenced by estimated needs, ongoing  GOAL:   Patient will meet greater than or equal to 90% of their needs, progressing  MONITOR:   PO intake, Supplement acceptance, Labs, Skin, Weight trends, I & O's  ASSESSMENT:   Pt with PMH of PVD, GERD, HTN, s/p R AKA 1/19 admitted with LLE wound infection. Dr Oneida Alar has recommended left leg amputation but pt refused.    Pt advanced from Clear Liquids to Soft diet 4/25. PO intake has been mostly 100% per flowsheet records. Receiving Ensure Enlive nutrition supplements TID. Medications include MVI, Vitamin C & Oscal with D. Labs reviewed. Glucose, Bld 103 (H).  Diet Order:   Diet Order           DIET SOFT Room service appropriate? Yes; Fluid consistency: Thin  Diet effective now         EDUCATION NEEDS:   Education needs have been addressed  Skin:  Skin Assessment: Skin Integrity Issues:(MASD buttocks) Skin Integrity Issues:: Unstageable Unstageable: L heel  Last BM:  4/29  Height:   Ht Readings from Last 1 Encounters:  03/09/18 5\' 3"  (1.6 m)   Weight:   Wt Readings from Last 1 Encounters:  03/17/18 204 lb 2.3 oz (92.6 kg)   Ideal Body Weight:  48 kg  BMI:  37 kg/m2 (adjusted for R AKA)  Estimated Nutritional Needs:   Kcal:  1700-1900  Protein:  100-115 gm  Fluid:  1.7-1.9 L  Arthur Holms, RD, LDN Pager #: [phone] After-Hours Pager #: [phone]

## 2018-03-17 NOTE — Progress Notes (Signed)
Urine is yellow and very cloudy.

## 2018-03-18 ENCOUNTER — Other Ambulatory Visit: Payer: Self-pay

## 2018-03-18 LAB — BASIC METABOLIC PANEL
ANION GAP: 8 (ref 5–15)
BUN: 24 mg/dL — ABNORMAL HIGH (ref 6–20)
CO2: 27 mmol/L (ref 22–32)
Calcium: 8.6 mg/dL — ABNORMAL LOW (ref 8.9–10.3)
Chloride: 99 mmol/L — ABNORMAL LOW (ref 101–111)
Creatinine, Ser: 1.37 mg/dL — ABNORMAL HIGH (ref 0.44–1.00)
GFR calc non Af Amer: 35 mL/min — ABNORMAL LOW (ref >60)
GFR, EST AFRICAN AMERICAN: 41 mL/min — AB (ref >60)
Glucose, Bld: 95 mg/dL (ref 65–99)
POTASSIUM: 4 mmol/L (ref 3.5–5.1)
Sodium: 134 mmol/L — ABNORMAL LOW (ref 135–145)

## 2018-03-18 LAB — CBC
HEMATOCRIT: 29 % — AB (ref 36.0–46.0)
HEMOGLOBIN: 9.2 g/dL — AB (ref 12.0–15.0)
MCH: 29.7 pg (ref 26.0–34.0)
MCHC: 31.7 g/dL (ref 30.0–36.0)
MCV: 93.5 fL (ref 78.0–100.0)
Platelets: 299 10*3/uL (ref 150–400)
RBC: 3.1 MIL/uL — AB (ref 3.87–5.11)
RDW: 15.8 % — AB (ref 11.5–15.5)
WBC: 7.4 10*3/uL (ref 4.0–10.5)

## 2018-03-18 MED ORDER — DILTIAZEM HCL ER BEADS 120 MG PO CP24: ORAL_CAPSULE | ORAL | 0 refills | Status: AC

## 2018-03-18 MED ORDER — FUROSEMIDE 20 MG PO TABS: ORAL_TABLET | ORAL | 0 refills | Status: DC

## 2018-03-18 MED ORDER — ATORVASTATIN CALCIUM 10 MG PO TABS: 10.0000 mg | ORAL_TABLET | Freq: Every day | ORAL | 0 refills | Status: DC

## 2018-03-18 NOTE — Progress Notes (Signed)
Post discharge note- was informed by Butch Penny with [facility] that they had been able to contact pt's husband and per TC conversation with him he has declined delivery of DME-(hospital bed and w/c)- he informed [facility] that he no longer wanted hospital bed and that he did not need the w/c because it was "broken" so he had rented one from somewhere else. - CM has placed call to [facility] with [facility] so that they can f/u with husband tonight per Danise Mina they will take a loaner w/c with them just in case it is needed- [facility] will also f/u tomorrow on the first [facility] visit to be sure pt has needed DME in the home.

## 2018-03-18 NOTE — Discharge Summary (Addendum)
Discharge Summary  Shelby Reese TML:[id number] DOB: 1935-01-12  PCP: [name], MD  Admit date: 02/23/2018 Discharge date:  03/18/2018   Time spent: < 25 minutes  Admitted From: Home Disposition:  Home  Recommendations for Outpatient Follow-up:  1. Follow up with PCP in 1-2 weeks. 2. Please obtain BMP (creatinine) in one week 3. Take Daily aspirin. No plavix given GI bleed. Hold lasix until seen by PCP, benazepril ok  Home Health:PT, RN, OT, Nurses Aide, Declined SNF/ALF   Discharge Diagnoses:  Active Hospital Problems   Diagnosis Date Noted  . Shocks, hemorrhagic ([place]) 03/09/2018  . Melena 03/09/2018  . GI bleed 03/09/2018  . Fecal occult blood test positive   . Hypervolemia   . Acute lower UTI 03/04/2018  . AKI (acute kidney injury) ([place])   . Arterioloscleroses 03/02/2018  . Anemia 02/23/2018  . Foot ulcer ([place]) 02/23/2018  . Peripheral vascular disease of lower extremity with ulceration ([place]) 12/27/2017  . CKD (chronic kidney disease), stage III ([place]) 08/29/2017  . Peripheral vascular disease ([place]) 08/29/2017  . Atherosclerosis of artery of right lower extremity ([place]) 07/11/2017  . Essential hypertension 07/11/2017  . Hypothyroidism, acquired 12/27/2014  . Iron deficiency anemia 12/27/2014    Resolved Hospital Problems  No resolved problems to display.    Discharge Condition: Stable  CODE STATUS:Full Diet recommendation: Heart Healthy   Vitals:   03/18/18 0440 03/18/18 0611  BP: (!) 175/57 (!) 135/51  Pulse:  77  Resp:  12  Temp:    SpO2:  95%    History of present illness:  Shelby Reese is a 83 y.o. year old female with medical history significant for PVD status post right AKA, bilateral severe chronic venous stasis, hypertension, iron deficiency anemia, hypothyroidism, acute on chronic anemia who presented on 02/23/2018 with worsening left foot ulcer that did not improve with outpatient doxycycline therapy and was found to have nonhealing left foot  ulcer requiring CFA arthrectomy and stent placement on 4/16, hospital stay complicated by GI bleed while on dual antiplatelet therapy and hemorrhagic shock. Remaining hospital course addressed in problem based format below:   Hospital Course:  Principal Problem:   Shocks, hemorrhagic ([place]) Active Problems:   Hypothyroidism, acquired   Iron deficiency anemia   Atherosclerosis of artery of right lower extremity (H[facility])   Essential hypertension   CKD (chronic kidney disease), stage III (H[facility])   Peripheral vascular disease ([place])   Peripheral vascular disease of lower extremity with ulceration (H[facility])   Anemia   Foot ulcer ([place])   Arterioloscleroses   Acute lower UTI   AKI (acute kidney injury) ([place])   Melena   Fecal occult blood test positive   Hypervolemia   GI bleed  #Ischemic ulceration of left foot.  Dr. Owens Shark initially recommended amputation;however, patient wanted to exhaust other options.  Status post left superficial femoral artery stent and arthrectomy with mechanical thrombectomy of left superficial femoral artery after aortogram on 03/03/2018.  Treated with Plavix and aspirin after procedure.  In setting of GI bleed patient was continued on aspirin during remainder of  hospitalization and on discharge.  Patient received 10 days of antibiotics during admission( IV clindamycin, IV Rocephin).  Outpatient follow-up with vascular arranged  #Acute on chronic blood loss anemia with hemorrhagic shock, resolved.  Patient declined evaluation by endoscopy during initial consultation with GI in early part of admission.  However given episodes of melanotic bowel movements and hypotension several days after starting aspirin and Plavix with hemoglobin drop  GI was reconsulted.  Required 2 units packed red blood cell transfusion and transferred to ICU on 4/22 initiation of IV Levophed and IV Protonix drip.  Emergent bedside EGD with normal esophagus, nonbleeding gastric polyp no evidence of recent bleed  gastric cavity.  #AKI, likely prerenal etiology.  Multifactorial etiology, contrast related to angiogram, hypotensive episodes related to hemorrhagic shock in setting of acute GI bleed of unclear etiology, prior to this patient was also volume overloaded during admission requiring IV diuresis.  ACE inhibitor and Lasix were held until improvement in creatinine.  Peak creatinine of 2.37 during admission, baseline 0.9-1.  On discharge patient creatinine 1.3.  Will need follow-up BMP on PCP hospital f/u.  Continue home ACE inhibitor on discharge,  instructed to hold further Lasix therapy until seen by PCP  #UTI secondary to Providencia rettgeri (02/23/2018) treated with IV ceftriaxone. ADDENDUM: urine culture from 03/17/2018 returned positive for P. Aeruginosa and Citrobacter. I called listed contact Husband Darnesha Diloreto [phone]) but similar to other providers who have tried to reach patient since discharge it sounds like the phone is picking up but no one spoke.   #Disposition: Physical therapy recommended skilled nursing facility placement.  Patient has been declined SNF and ALF.  Patient to return home with home health services arranged on discharge    Antibiotics: Aztreonam/9 IV ceftriaxone 4/9-4/18 Cefazolin 4/8-4/5 Clindamycin 4/8-4/17  Microbiology: Blood: 4/23negative (x2), 4/8 negative Urine: 4/8: Providencia rettgeri, 4/30: negative   Consultations:  GI, vascular surgery   Procedures/Studies: EGD on 4/22: Normal-appearing esophagus, nonbleeding potentially the polyp and gastric cardia, no evidence of recent or active bleed in entire gastric cavity US Renal  Result Date: 02/25/2018 CLINICAL DATA:  Acute kidney injury, history hypertension, stage III chronic kidney disease, former smoker EXAM: RENAL / URINARY TRACT ULTRASOUND COMPLETE COMPARISON:  None FINDINGS: Right Kidney: Length: 9.9 cm. Minimal cortical thinning. Normal cortical echogenicity. No mass, hydronephrosis or  shadowing calcification. Left Kidney: Length: 9.6 cm. Minimal cortical thinning. Normal cortical echogenicity. No mass, hydronephrosis or shadowing calcification. Bladder: Echogenic debris dependently within urinary bladder. No gross bladder wall thickening/mass. IMPRESSION: Unremarkable sonographic appearance of the kidneys for age. Significant echogenic debris dependently within urinary bladder. Electronically Signed   By: Lavonia Dana M.D.   On: 02/25/2018 10:56   Dg Chest Port 1 View  Result Date: 03/13/2018 CLINICAL DATA:  Short of breath.  Colonoscopy yesterday EXAM: PORTABLE CHEST 1 VIEW COMPARISON:  03/09/2018 FINDINGS: Hypoventilation with mild bibasilar atelectasis/infiltrate. Negative for heart failure or effusion Left subclavian central venous catheter tip in the SVC unchanged. No pneumothorax. IMPRESSION: Progression of mild bibasilar atelectasis/infiltrate. Electronically Signed   By: Franchot Gallo M.D.   On: 03/13/2018 09:26   Dg Chest Port 1 View  Result Date: 03/09/2018 CLINICAL DATA:  Central line placement. EXAM: PORTABLE CHEST 1 VIEW COMPARISON:  03/07/2018. FINDINGS: Low lung volumes. LEFT subclavian line has been placed. Tip lies at the cavoatrial junction. There is no pneumothorax. Slight improvement aeration. Some clearing of edema. Low lung volumes with scarring on the LEFT appears stable. IMPRESSION: LEFT subclavian line placement, satisfactory position cavoatrial junction. No pneumothorax. Electronically Signed   By: Staci Righter M.D.   On: 03/09/2018 19:42   Dg Chest Port 1 View  Result Date: 03/07/2018 CLINICAL DATA:  Shortness of breath and chest pain for 1 day. EXAM: PORTABLE CHEST 1 VIEW COMPARISON:  Chest radiograph August 29, 2017 FINDINGS: Cardiac silhouette is normal in size. Calcified aortic knob. Worsening interstitial prominence. Bandlike density  LEFT lower lung zone. Kerley B-lines. No pleural effusion or focal consolidation. No pneumothorax. Osteopenia and  dextroscoliosis. IMPRESSION: Worsening interstitial prominence seen with pulmonary edema and/or atypical infection. LEFT lung base atelectasis/scarring. Aortic Atherosclerosis (ICD10-I70.0). Electronically Signed   By: Elon Alas M.D.   On: 03/07/2018 15:06   Dg Foot Complete Left  Result Date: 02/23/2018 CLINICAL DATA:  Left foot redness, pain and swelling. Question osteomyelitis. EXAM: LEFT FOOT - COMPLETE 3+ VIEW COMPARISON:  None. FINDINGS: There appear to be skin wounds over the dorsum of the foot centered at the level of the cuneiforms, neck of the talus and tibiotalar joint. Lucencies are identified in the bones the midfoot, most conspicuous in the cuboid and lateral cuneiform as well as the fourth metatarsal. No fracture. Atherosclerosis noted. No soft tissue gas or radiopaque foreign body. IMPRESSION: Lucencies in the bones of the midfoot are nonspecific and could be due to early neuropathic change or osteomyelitis. Skin wounds on the dorsum of the foot. Electronically Signed   By: Inge Rise M.D.   On: 02/23/2018 15:55      Discharge Exam: BP (!) 135/51   Pulse 77   Temp 98.2 F (36.8 C) (Oral)   Resp 12   Ht 5\' 3"  (1.6 m)   Wt 90.2 kg (198 lb 13.7 oz)   SpO2 95%   BMI 35.23 kg/m   General: Lying in bed comfortably, no apparent distress Eyes: EOMI, anicteric ENT: Oral Mucosa clear and moist Cardiovascular: regular rate and rhythm, no murmurs, rubs or gallops, Peripheral Pulses Present, no edema, no JVD Respiratory: Normal respiratory effort, lungs clear to auscultation bilaterally Abdomen: soft, non-distended, non-tender, normal bowel sounds Skin:dry skin Musculoskeletal:R AKA, L left dressing in place along shin, warm to touch, moving toes Neurologic: Grossly no focal neuro deficit.Mental status AAOx3, speech normal, Psychiatric:Appropriate affect, and mood   Discharge Instructions You were cared for by a hospitalist during your hospital stay. If you have any  questions about your discharge medications or the care you received while you were in the hospital after you are discharged, you can call the unit and asked to speak with the hospitalist on call if the hospitalist that took care of you is not available. Once you are discharged, your primary care physician will handle any further medical issues. Please note that NO REFILLS for any discharge medications will be authorized once you are discharged, as it is imperative that you return to your primary care physician (or establish a relationship with a primary care physician if you do not have one) for your aftercare needs so that they can reassess your need for medications and monitor your lab values.  Discharge Instructions    AMB Referral to [facility]   Complete by:  As directed    Please assign to [place] Worker for transportation. [facility] NP will follow. [facility] Care Management active consent remains on file. To discharge soon. Will have home health with [facility] Declined SNF placement. PCP office [facility]) listed as doing toc. Please call with questions. Marthenia Rolling, [place], [facility] Liaison-[phone]   Reason for consult:  Please assign to [place] Worker   Diagnoses of:  Other   Other Diagnosis:  PVD   Expected date of contact:  1-3 days (reserved for hospital discharges)   Diet - low sodium heart healthy   Complete by:  As directed    Increase activity slowly   Complete by:  As directed  Allergies as of 03/18/2018      Reactions   Cinnamon Hives   Ciprofloxacin Other (See Comments)   TREMORS   Diovan [valsartan] Other (See Comments)   Extreme vertigo   Food Diarrhea, Other (See Comments)   ORANGE JUICE   UPSET STOMACH   Latex Rash, Other (See Comments)   Rash/inflammation due to exposure   Nitrofuran Derivatives Hives, Rash   "Full body rash"   Penicillins Hives, Swelling   *tolerated Ceftriaxone September 2018 Has patient had a  PCN reaction causing immediate rash, facial/tongue/throat swelling, SOB or lightheadedness with hypotension:No--severe irritation at the injection site Has patient had a PCN reaction causing severe rash involving mucus membranes or skin necrosis:Unknown Has patient had a PCN reaction that required hospitalization:No Has patient had a PCN reaction occurring within the last 10 years:Yes If all of the above answers are "NO", then may proceed with   Bactrim [sulfamethoxazole-trimethoprim] Diarrhea, Nausea Only   Other Rash   Mycins, Strawberry, Oranges   Sulfa Antibiotics Diarrhea, Nausea Only      Medication List    STOP taking these medications   doxycycline 100 MG tablet Commonly known as:  VIBRA-TABS     TAKE these medications   amitriptyline 50 MG tablet Commonly known as:  ELAVIL Take 50 mg by mouth at bedtime.   aspirin EC 81 MG tablet Take 81 mg by mouth daily with breakfast.   atorvastatin 10 MG tablet Commonly known as:  LIPITOR Take 1 tablet (10 mg total) by mouth daily at 6 PM.   benazepril 40 MG tablet Commonly known as:  LOTENSIN Take 40 mg by mouth daily.   CALCIUM 600+D 600-400 MG-UNIT tablet Generic drug:  Calcium Carbonate-Vitamin D Take 1 tablet by mouth daily at 3 pm.   cetirizine 10 MG tablet Commonly known as:  ZYRTEC Take 10 mg by mouth at bedtime.   diltiazem 120 MG 24 hr capsule Commonly known as:  TIAZAC HOLD until seen by your PCP What changed:    how much to take  how to take this  when to take this  additional instructions   docusate sodium 100 MG capsule Commonly known as:  COLACE Take 1 capsule (100 mg total) by mouth daily.   escitalopram 10 MG tablet Commonly known as:  LEXAPRO Take 10 mg by mouth daily.   famotidine 40 MG tablet Commonly known as:  PEPCID Take 40 mg by mouth daily at 3 pm. 1600   feeding supplement (ENSURE ENLIVE) Liqd Take 237 mLs by mouth 3 (three) times daily with meals.   fentaNYL 25 MCG/HR  patch Commonly known as:  DURAGESIC - dosed mcg/hr Place 25 mcg onto the skin every 3 (three) days.   ferrous sulfate 325 (65 FE) MG tablet Take 325 mg by mouth 2 (two) times daily with a meal.   furosemide 20 MG tablet Commonly known as:  LASIX HOLD until seen by your PCP What changed:    how much to take  how to take this  when to take this  additional instructions   hydrALAZINE 50 MG tablet Commonly known as:  APRESOLINE Take 50 mg by mouth 4 (four) times daily.   methocarbamol 500 MG tablet Commonly known as:  ROBAXIN Take 500 mg by mouth every 6 (six) hours as needed for muscle spasms.   multivitamin with minerals tablet Take 1 tablet by mouth daily.   oxyCODONE 5 MG immediate release tablet Commonly known as:  Oxy IR/ROXICODONE Take 5 mg by  mouth every 4 (four) hours. Every 30 mins before therapy   pantoprazole 40 MG tablet Commonly known as:  PROTONIX Take 1 tablet (40 mg total) by mouth daily.   polyethylene glycol packet Commonly known as:  MIRALAX / GLYCOLAX Take 17 g by mouth 2 (two) times daily.   SENNA-PLUS 8.6-50 MG tablet Generic drug:  senna-docusate Take 2 tablets by mouth 2 (two) times daily.   SYNTHROID 175 MCG tablet Generic drug:  levothyroxine Take 175 mcg by mouth daily before breakfast.   vitamin C 500 MG tablet Commonly known as:  ASCORBIC ACID Take 500 mg by mouth daily.            Durable Medical Equipment  (From admission, onward)        Start     Ordered   03/17/18 1435  For home use only DME standard manual wheelchair with seat cushion  Once    Comments:  Patient suffers from Right above-knee amputation which impairs their ability to perform daily activities like bathing, dressing and grooming in the home.  A cane, crutch or walker will not resolve  issue with performing activities of daily living. A wheelchair will allow patient to safely perform daily activities. Patient can safely propel the wheelchair in the home or  has a caregiver who can provide assistance.  Accessories: elevating leg rests (ELRs), wheel locks, extensions and anti-tippers.   03/17/18 1434     Allergies  Allergen Reactions  . Cinnamon Hives  . Ciprofloxacin Other (See Comments)    TREMORS  . Diovan [Valsartan] Other (See Comments)    Extreme vertigo  . Food Diarrhea and Other (See Comments)    ORANGE JUICE   UPSET STOMACH  . Latex Rash and Other (See Comments)    Rash/inflammation due to exposure  . Nitrofuran Derivatives Hives and Rash    "Full body rash"  . Penicillins Hives and Swelling    *tolerated Ceftriaxone September 2018 Has patient had a PCN reaction causing immediate rash, facial/tongue/throat swelling, SOB or lightheadedness with hypotension:No--severe irritation at the injection site Has patient had a PCN reaction causing severe rash involving mucus membranes or skin necrosis:Unknown Has patient had a PCN reaction that required hospitalization:No Has patient had a PCN reaction occurring within the last 10 years:Yes If all of the above answers are "NO", then may proceed with  . Bactrim [Sulfamethoxazole-Trimethoprim] Diarrhea and Nausea Only  . Other Rash    Mycins, Strawberry, Oranges  . Sulfa Antibiotics Diarrhea and Nausea Only   Follow-up Information    Serafina Mitchell, MD. Schedule an appointment as soon as possible for a visit in 1 month(s).   Specialties:  Vascular Surgery, Cardiology Contact information: [place] [address] [place] Follow up.   Specialty:  [place] Why:  HHRN/PT/OT/aide/social work  services arranged - they will call you to set up home visits Contact information: [address] [place] [address] [phone]            The results of significant diagnostics from this hospitalization (including imaging, microbiology, ancillary and laboratory) are listed below for reference.    Significant  Diagnostic Studies: US Renal  Result Date: 02/25/2018 CLINICAL DATA:  Acute kidney injury, history hypertension, stage III chronic kidney disease, former smoker EXAM: RENAL / URINARY TRACT ULTRASOUND COMPLETE COMPARISON:  None FINDINGS: Right Kidney: Length: 9.9 cm. Minimal cortical thinning. Normal cortical echogenicity. No mass, hydronephrosis  or shadowing calcification. Left Kidney: Length: 9.6 cm. Minimal cortical thinning. Normal cortical echogenicity. No mass, hydronephrosis or shadowing calcification. Bladder: Echogenic debris dependently within urinary bladder. No gross bladder wall thickening/mass. IMPRESSION: Unremarkable sonographic appearance of the kidneys for age. Significant echogenic debris dependently within urinary bladder. Electronically Signed   By: Lavonia Dana M.D.   On: 02/25/2018 10:56   Dg Chest Port 1 View  Result Date: 03/13/2018 CLINICAL DATA:  Short of breath.  Colonoscopy yesterday EXAM: PORTABLE CHEST 1 VIEW COMPARISON:  03/09/2018 FINDINGS: Hypoventilation with mild bibasilar atelectasis/infiltrate. Negative for heart failure or effusion Left subclavian central venous catheter tip in the SVC unchanged. No pneumothorax. IMPRESSION: Progression of mild bibasilar atelectasis/infiltrate. Electronically Signed   By: Franchot Gallo M.D.   On: 03/13/2018 09:26   Dg Chest Port 1 View  Result Date: 03/09/2018 CLINICAL DATA:  Central line placement. EXAM: PORTABLE CHEST 1 VIEW COMPARISON:  03/07/2018. FINDINGS: Low lung volumes. LEFT subclavian line has been placed. Tip lies at the cavoatrial junction. There is no pneumothorax. Slight improvement aeration. Some clearing of edema. Low lung volumes with scarring on the LEFT appears stable. IMPRESSION: LEFT subclavian line placement, satisfactory position cavoatrial junction. No pneumothorax. Electronically Signed   By: Staci Righter M.D.   On: 03/09/2018 19:42   Dg Chest Port 1 View  Result Date: 03/07/2018 CLINICAL DATA:  Shortness  of breath and chest pain for 1 day. EXAM: PORTABLE CHEST 1 VIEW COMPARISON:  Chest radiograph August 29, 2017 FINDINGS: Cardiac silhouette is normal in size. Calcified aortic knob. Worsening interstitial prominence. Bandlike density LEFT lower lung zone. Kerley B-lines. No pleural effusion or focal consolidation. No pneumothorax. Osteopenia and dextroscoliosis. IMPRESSION: Worsening interstitial prominence seen with pulmonary edema and/or atypical infection. LEFT lung base atelectasis/scarring. Aortic Atherosclerosis (ICD10-I70.0). Electronically Signed   By: Elon Alas M.D.   On: 03/07/2018 15:06   Dg Foot Complete Left  Result Date: 02/23/2018 CLINICAL DATA:  Left foot redness, pain and swelling. Question osteomyelitis. EXAM: LEFT FOOT - COMPLETE 3+ VIEW COMPARISON:  None. FINDINGS: There appear to be skin wounds over the dorsum of the foot centered at the level of the cuneiforms, neck of the talus and tibiotalar joint. Lucencies are identified in the bones the midfoot, most conspicuous in the cuboid and lateral cuneiform as well as the fourth metatarsal. No fracture. Atherosclerosis noted. No soft tissue gas or radiopaque foreign body. IMPRESSION: Lucencies in the bones of the midfoot are nonspecific and could be due to early neuropathic change or osteomyelitis. Skin wounds on the dorsum of the foot. Electronically Signed   By: Inge Rise M.D.   On: 02/23/2018 15:55    Microbiology: Recent Results (from the past 240 hour(s))  MRSA PCR Screening     Status: None   Collection Time: 03/09/18 11:55 PM  Result Value Ref Range Status   MRSA by PCR NEGATIVE NEGATIVE Final    Comment:        The GeneXpert MRSA Assay (FDA approved for NASAL specimens only), is one component of a comprehensive MRSA colonization surveillance program. It is not intended to diagnose MRSA infection nor to guide or monitor treatment for MRSA infections. Performed at [facility], [place] [address]  [place], [place] [address]   Culture, blood (routine x 2)     Status: None   Collection Time: 03/10/18  6:36 PM  Result Value Ref Range Status   Specimen Description BLOOD RIGHT HAND  Final   Special Requests  Final    BOTTLES DRAWN AEROBIC ONLY Blood Culture adequate volume   Culture   Final    NO GROWTH 5 DAYS Performed at [facility], [place] [address] [place], [place] [address]    Report Status 03/15/2018 FINAL  Final  Culture, blood (routine x 2)     Status: None   Collection Time: 03/10/18  6:36 PM  Result Value Ref Range Status   Specimen Description BLOOD RIGHT ANTECUBITAL  Final   Special Requests   Final    BOTTLES DRAWN AEROBIC ONLY Blood Culture adequate volume   Culture   Final    NO GROWTH 5 DAYS Performed at [facility], [place] [address], [place], [place] [address]    Report Status 03/15/2018 FINAL  Final     Labs: Basic Metabolic Panel: Recent Labs  Lab 03/12/18 0418 03/13/18 0400 03/16/18 0242 03/17/18 1046 03/18/18 0242  NA 135 137 135 135 134*  K 3.3* 4.0 3.9 3.8 4.0  CL 104 107 102 101 99*  CO2 23 23 25 26 27   GLUCOSE 74 94 98 103* 95  BUN 31* 27* 23* 20 24*  CREATININE 1.65* 1.48* 1.36* 1.32* 1.37*  CALCIUM 7.7* 8.0* 8.5* 8.7* 8.6*   Liver Function Tests: Recent Labs  Lab 03/13/18 0400  AST 18  ALT 16  ALKPHOS 33*  BILITOT 0.6  PROT 4.7*  ALBUMIN 2.5*   No results for input(s): LIPASE, AMYLASE in the last 168 hours. No results for input(s): AMMONIA in the last 168 hours. CBC: Recent Labs  Lab 03/12/18 0418 03/12/18 1045 03/12/18 1635 03/12/18 2333 03/13/18 0400 03/14/18 0231 03/16/18 0242 03/17/18 1046 03/18/18 0242  WBC 9.1 8.4 8.3 9.8 9.7 9.9 10.0 7.5 7.4  NEUTROABS 5.3 5.3 5.3 6.6 6.3  --   --   --   --   HGB 8.6* 9.3* 8.6* 8.6* 8.7* 9.1* 10.2* 9.2* 9.2*  HCT 25.2* 27.8* 25.9* 26.4* 26.8* 27.9* 31.9* 29.3* 29.0*  MCV 89.4 90.0 90.9 92.0 92.4 92.7 93.0 93.3 93.5  PLT 137* 146* 152 175 173 209 248 321 299   Cardiac  Enzymes: No results for input(s): CKTOTAL, CKMB, CKMBINDEX, TROPONINI in the last 168 hours. BNP: BNP (last 3 results) No results for input(s): BNP in the last 8760 hours.  ProBNP (last 3 results) No results for input(s): PROBNP in the last 8760 hours.  CBG: Recent Labs  Lab 03/13/18 1628 03/13/18 1933 03/13/18 2109 03/14/18 0509 03/14/18 0816  GLUCAP 116* 140* 124* 81 74       Signed:  Desiree Hane, MD [facility] 03/18/2018, 1:22 PM

## 2018-03-18 NOTE — Progress Notes (Signed)
Patient ready for discharge. Patient IV has been DC'd and catheter was intact when removed. Called CMD and DC'd patient from telemetry. Patient has all of her belongings. Reviewed AVS with patient and she has all of her questions answered regarding her discharge. She is alert and oriented and Vital Signs are stable.  Patient is leaving the unit with [facility] who will transport patient to her home. Called Faythe Casa, RN with [facility] and told her that patient is being discharged and would be transported to her home by [facility]. Patient is leaving the unit with [facility] and paperwork has been given to [facility].

## 2018-03-18 NOTE — Care Management Note (Addendum)
Case Management Note  Patient Details  Name: Shelby Reese MRN: [id number] Date of Birth: 05-03-1935  Subjective/Objective:                    Action/Plan: Patient from [place] [facility] [place], active with [facility] . Will continue to follow for discharge needs. If patient needs home health at discharge will need new orders and face to face  Expected Discharge Date:  03/18/18               Expected Discharge Plan:  Assisted Living / Rest Home  In-House Referral:  Clinical Social Work  Discharge planning Services  CM Consult  Post Acute Care Choice:  Home Health, Resumption of Svcs/PTA Provider, Durable Medical Equipment(Active w/[facility] following.) Choice offered to:  Patient  DME Arranged:  Programmer, multimedia DME Agency:  [place] Arranged:  RN, PT, OT, Nurse's Aide, Social Work, Refused SNF [place] Agency:  [place]  Status of Service:  Completed, signed off  If discussed at [employer], dates discussed:  4/28, 4/30  Discharge Disposition: home/home health   Additional Comments:  03/18/18- 1430- Marvetta Gibbons RN, CM- pt for d/c home today- have spoken with Butch Penny at [facility] and confirmed pt has hospital bed which will by picked up from ALF and delivered back to home today- they will also pick up w/c if there and if not deliver a w/c to the home for discharge. Call received from Faythe Casa with [facility] [phone]) regarding transition needs for today- wanted to know timeframe frame for transport to be sure staffing was available for pt when she got home- [place] bedside RN to call [name] when pt ready for transport. Also discussed with [name] from [facility] wound care needs and [facility] that will be arranged with [facility]- RN will not be coming daily for dressing changes. [facility] called at 1440- for transportation estimated time for pickup one hour to 1 1/2 maybe longer.   03/17/18- 1145- [name] RN, CM- spoke with pt and CM  with [place] in home caregiver services at the bedside for transition needs- per pt MD has told her she is medically ready for discharge- pt is declining to return to SNF and wants to return home with spouse- per conversation pt wants to stay with [facility] for [facility] services (has been made [place]) orders have been placed for HHRN/PT/OT/aide/SW-  Discussed wound care- will f/u with MD for needed wound care orders at discharge. Also discussed DME needs- pt declining hoyer lift states she will plan to use slide board- states she wants a w/c for home- and will need transportation assistance home- will plan on [facility] (non emergent EMS)- have spoken with MD for w/c order- and call made to Butch Penny with [facility]-  Update: 1600- per Butch Penny with [facility] pt already had hospital bed and w/c that is showing in the [facility] system as being delivered- [facility] is unable to bill insurance again for this and will need to speak with pt regarding payment options for another w/c- have notified MD that pt will need to plan for d/c in am in order to resolve issue with w/c and safely transition pt home. CM will f/u with pt and [facility] in am for plan regarding w/c and then finial transition arrangements with pt. Pt will be followed by [facility] also at discharge.   Dawayne Patricia, RN 03/18/2018, 2:37 PM

## 2018-03-19 ENCOUNTER — Other Ambulatory Visit: Payer: Self-pay

## 2018-03-19 DIAGNOSIS — L97221 Non-pressure chronic ulcer of left calf limited to breakdown of skin: Secondary | ICD-10-CM | POA: Diagnosis not present

## 2018-03-19 DIAGNOSIS — I739 Peripheral vascular disease, unspecified: Secondary | ICD-10-CM | POA: Diagnosis not present

## 2018-03-19 DIAGNOSIS — L89620 Pressure ulcer of left heel, unstageable: Secondary | ICD-10-CM | POA: Diagnosis not present

## 2018-03-19 DIAGNOSIS — N390 Urinary tract infection, site not specified: Secondary | ICD-10-CM | POA: Diagnosis not present

## 2018-03-19 DIAGNOSIS — Z4781 Encounter for orthopedic aftercare following surgical amputation: Secondary | ICD-10-CM | POA: Diagnosis not present

## 2018-03-19 DIAGNOSIS — L97521 Non-pressure chronic ulcer of other part of left foot limited to breakdown of skin: Secondary | ICD-10-CM | POA: Diagnosis not present

## 2018-03-19 NOTE — Patient Outreach (Signed)
[facility]) Care Management  03/19/2018  ZAIDY ABSHER 01/09/1935 [id number]   Referral received from hospital liaison on 03/18/18 indicating patient had social work needs in regards to assistance with transportation. BSW contacted patient on today's date. HIPAA identifiers verified. Patient stated she is doing great and so happy to be home.  BSW spoke with patient about referral for assistance with transportation. Patient stated her main transportation concern involves going to physician appointments. Patient does not plan to leave the house for any other reason at this time due to recent amputation.  Patient denies any current appointments scheduled. BSW spoke with patient about SCAT. Patient stated "that is just what I need". BSW discussed application process and educated patient on [place] [employer]'s ability to assist with transportation needs while awaiting SCAT approval.   Patient agreeable to allowing BSW to assist with completion of SCAT application but would rather not complete today due to just returning home from the hospital.   Plan: BSW to contact patient in the next 2 weeks to assist in completion of SCAT application. Patient to contact BSW with any transportation needs before SCAT approved.  Patient understands transportation needs must be arranged at least 3 business days prior to appointment.  Daneen Schick, BSW, CDP Social Worker Cell # [phone] [email or web address]

## 2018-03-20 ENCOUNTER — Encounter: Payer: Self-pay | Admitting: *Deleted

## 2018-03-20 ENCOUNTER — Other Ambulatory Visit: Payer: Self-pay

## 2018-03-20 ENCOUNTER — Other Ambulatory Visit: Payer: Self-pay | Admitting: *Deleted

## 2018-03-20 DIAGNOSIS — L97521 Non-pressure chronic ulcer of other part of left foot limited to breakdown of skin: Secondary | ICD-10-CM | POA: Diagnosis not present

## 2018-03-20 DIAGNOSIS — I739 Peripheral vascular disease, unspecified: Secondary | ICD-10-CM | POA: Diagnosis not present

## 2018-03-20 DIAGNOSIS — Z4781 Encounter for orthopedic aftercare following surgical amputation: Secondary | ICD-10-CM | POA: Diagnosis not present

## 2018-03-20 DIAGNOSIS — N390 Urinary tract infection, site not specified: Secondary | ICD-10-CM | POA: Diagnosis not present

## 2018-03-20 DIAGNOSIS — L89620 Pressure ulcer of left heel, unstageable: Secondary | ICD-10-CM | POA: Diagnosis not present

## 2018-03-20 DIAGNOSIS — L97221 Non-pressure chronic ulcer of left calf limited to breakdown of skin: Secondary | ICD-10-CM | POA: Diagnosis not present

## 2018-03-20 NOTE — Patient Outreach (Signed)
Initial telephone call for transition of care. I called today and it seemed like someone answered the phone but did not speak. I stated the reason for my call and requested a return call.  I will ask my coworker Shelby Reese to advise pt I am trying to reach her and to call me back. I have a suspicision that call screening is going on and she may not want to speak to someone unknown.  Eulah Pont. Myrtie Neither, MSN, [facility] Gerontological Nurse Practitioner [facility] Care Management [phone]

## 2018-03-20 NOTE — Patient Outreach (Signed)
[facility]) Care Management  03/20/2018  TASHIBA TIMONEY February 25, 1935 [id number]  BSW received a voicemail from [employer], social work navigator with [place]. Jenny Reichmann indicated due to [place] Work involvement she did not plan to send a [place] representative to patients home. BSW spoke with supervisor about complexity of patients needs. BSW referred patient to LCSW, [name], who was previously involved in patients care. BSW contacted Nestor Lewandowsky, left a voice message of plan.   BSW also received a call from [place] on today's date in regards to being unable to reach patient. BSW made two attempts to contact patient in hopes of linking patient with RNCM assigned to her care team. Each time patients phone would ring once, then sounded as if someone answered the phone but did not say anything. BSW updated [facility] RNCM with unsuccessful outreach.  Daneen Schick, BSW, CDP Social Worker Cell # [phone] [email or web address]

## 2018-03-21 LAB — URINE CULTURE
Culture: >=100000 — AB
Special Requests: NORMAL

## 2018-03-23 ENCOUNTER — Other Ambulatory Visit: Payer: Self-pay | Admitting: *Deleted

## 2018-03-23 DIAGNOSIS — I739 Peripheral vascular disease, unspecified: Secondary | ICD-10-CM | POA: Diagnosis not present

## 2018-03-23 DIAGNOSIS — Z4781 Encounter for orthopedic aftercare following surgical amputation: Secondary | ICD-10-CM | POA: Diagnosis not present

## 2018-03-23 DIAGNOSIS — L97221 Non-pressure chronic ulcer of left calf limited to breakdown of skin: Secondary | ICD-10-CM | POA: Diagnosis not present

## 2018-03-23 DIAGNOSIS — L89620 Pressure ulcer of left heel, unstageable: Secondary | ICD-10-CM | POA: Diagnosis not present

## 2018-03-23 DIAGNOSIS — L97521 Non-pressure chronic ulcer of other part of left foot limited to breakdown of skin: Secondary | ICD-10-CM | POA: Diagnosis not present

## 2018-03-23 DIAGNOSIS — N390 Urinary tract infection, site not specified: Secondary | ICD-10-CM | POA: Diagnosis not present

## 2018-03-23 NOTE — Patient Outreach (Signed)
Intial attempts made to reach pt for transition of care check. Her

## 2018-03-24 ENCOUNTER — Encounter: Payer: Self-pay | Admitting: *Deleted

## 2018-03-24 ENCOUNTER — Other Ambulatory Visit: Payer: Self-pay | Admitting: *Deleted

## 2018-03-24 NOTE — Patient Outreach (Signed)
Initial telephone outreach completed. Pt was discharged last week from the hospital. Her phone has been off the hook, unbeknownst to her, therefore she has missed many calls from services trying to schedule home visits for her care.  Pt reassures me she is doing well and has the meds she needs. She tells me she is not taking all the meds on her discharge list as they are unneccessary. She is going back to her previous meds she was taking before hand. 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 She says he got along OK while she was in the hospital.  She states she will go see her GI specialist and her cardiologist but doesn't see the point of making another appt with her primary care, she will see him at her next scheduled visit.  Dr. Pennie Banter office is to be making transition of care calls, however, she has not heard from them, since her phone has been off the hook.  She agrees to allow me to visit her on Wednesday this week. I will call in the am to verify she is still in agreement with the visit.  Shelby Pont. Myrtie Neither, MSN, [facility] Gerontological Nurse Practitioner [facility] Care Management [phone]

## 2018-03-24 NOTE — Patient Outreach (Signed)
[facility]) Care Management  03/24/2018  Shelby Reese 09/17/1935 [id number]   CSW was able to make initial contact with patient today to perform the initial phone assessment, as well as assess and assist with social work needs and services.  CSW introduced self, explained role and types of services provided through [employer].  Two HIPAA compliant identifiers were obtained from patient, which included patient's name and date of birth.  CSW will report off all information obtained from patient to Daneen Schick, [place] colleague, also with [place] [facility], currently working with patient on wheelchair transportation to and from physician appointments. Patient reports that she is managing well, since returning home to live with her husband, upon discharge from the hospital.  Patient admits to being very weak and deconditioned, not able to transfer herself from her wheelchair to her bed and vice versa, without assistance.  Patient has been assigned a home health nurse for wound care and dressing changes, as patient is a new amputee.  Patient has also been assigned a home health physical therapist, both through [place], for strengthening, conditioning, mobility, safety, etc.  Patient has hired a Location manager to provide assistance with bathing, dressing, meal preparation, etc.  Patient indicated that she has all the durable medical equipment she needs in the home.   Patient denied being able to identify any social work specific needs at present.  Patient is not interested in completing her Advanced Directives ([place] documents).  Patient did not feel as though she needs counseling and supportive services for the loss of her leg.  Patient indicated that she is able to afford her prescription medications and that she takes them exactly as prescribed.  Patient is not interested in placement,  of any kind, nor was patient interested in receiving a list of long-term care facilities. CSW will perform a case closure on patient, as all goals of treatment have been met from social work standpoint and no additional social work needs have been identified at this time.  CSW will notify Mrs. Humble of CSW's plans to close patient's case.  CSW will fax an update to patient's Primary Care Physician, Dr. [name] to ensure that they are aware of CSW's involvement with patient's plan of care.  CSW was able to ensure that patient has the correct contact information for CSW, in the event that she changes her mind about needing social work services. Nat Christen, BSW, MSW, LCSW  Licensed Education officer, [facility]  Mailing [place] [address], [place], [place] [address] Physical Address-[address], [place],  [address] Toll Free Main # [phone] Fax # [phone] Cell # [phone]  Office # [phone] [email or web address]

## 2018-03-25 ENCOUNTER — Encounter: Payer: Self-pay | Admitting: *Deleted

## 2018-03-25 ENCOUNTER — Ambulatory Visit: Payer: Self-pay | Admitting: *Deleted

## 2018-03-25 ENCOUNTER — Other Ambulatory Visit: Payer: Self-pay | Admitting: *Deleted

## 2018-03-25 NOTE — Patient Outreach (Addendum)
Initial home visit post hospitalization for PVD and anemia. Pt is home comfortably with her husband. She is getting home health from [facility] for wound care and she pays private for an aide to come 7 days a week for 8 hours. Regino Schultze is her regular caregiver.  She reports she has a new skin breakdown develop on the back of her calf. Home health is treating and monitoring this.  She had to cancel an MD appt today which was her AWV but her transportation service did not provide someone who would help her to get into the [facility] and into the [place].  She reports she and her husband will be able to do transfers together using a sliding board in and out of the car. Mr. Batts is able to drive.   She needs a wheelchair of her own (wt 180, ht 5\' 4" ) and a pressure relieving cushion, elevating L leg rest.  She also needs a transfer bench but she is not sold on this idea. I have planted the seed.  Discussed pressure relieving methods.  BP (!) 200/60 (BP Location: Left Arm, Patient Position: Sitting, Cuff Size: Normal)   Pulse 76   Resp 18   Wt 180 lb (81.6 kg)   SpO2 96%   BMI 31.89 kg/m    Heart RRR Lungs are clear Dsg in place to LLL.  Outpatient Encounter Medications as of 03/25/2018  Medication Sig Note  . aspirin EC 81 MG tablet Take 81 mg by mouth daily with breakfast.   . benazepril (LOTENSIN) 40 MG tablet Take 40 mg by mouth daily.   . Calcium Carbonate-Vitamin D (CALCIUM 600+D) 600-400 MG-UNIT per tablet Take 1 tablet by mouth daily at 3 pm.    . cetirizine (ZYRTEC) 10 MG tablet Take 10 mg by mouth at bedtime.    Marland Kitchen diltiazem (TIAZAC) 120 MG 24 hr capsule HOLD until seen by your PCP   . escitalopram (LEXAPRO) 10 MG tablet Take 10 mg by mouth daily.   . famotidine (PEPCID) 40 MG tablet Take 40 mg by mouth daily at 3 pm. 1600   . ferrous sulfate 325 (65 FE) MG tablet Take 325 mg by mouth 2 (two) times daily with a meal.   . Multiple Vitamins-Minerals (MULTIVITAMIN WITH MINERALS) tablet Take 1  tablet by mouth daily.   . pantoprazole (PROTONIX) 40 MG tablet Take 1 tablet (40 mg total) by mouth daily.   Marland Kitchen SYNTHROID 175 MCG tablet Take 175 mcg by mouth daily before breakfast.    . vitamin C (ASCORBIC ACID) 500 MG tablet Take 500 mg by mouth daily.   Marland Kitchen amitriptyline (ELAVIL) 50 MG tablet Take 50 mg by mouth at bedtime. 03/26/2018: Not taking.  Marland Kitchen atorvastatin (LIPITOR) 10 MG tablet Take 1 tablet (10 mg total) by mouth daily at 6 PM. (Patient not taking: Reported on 03/26/2018) 03/26/2018: Not taking.  . docusate sodium (COLACE) 100 MG capsule Take 1 capsule (100 mg total) by mouth daily. (Patient not taking: Reported on 03/25/2018) 03/26/2018: Not taking.  . feeding supplement, ENSURE ENLIVE, (ENSURE ENLIVE) LIQD Take 237 mLs by mouth 3 (three) times daily with meals. (Patient not taking: Reported on 03/26/2018) 03/26/2018: Not using.  . fentaNYL (DURAGESIC - DOSED MCG/HR) 25 MCG/HR patch Place 25 mcg onto the skin every 3 (three) days. 03/26/2018: Not using, doesn't have this at all.  . furosemide (LASIX) 20 MG tablet HOLD until seen by your PCP (Patient not taking: Reported on 03/25/2018)   . hydrALAZINE (APRESOLINE)  50 MG tablet Take 50 mg by mouth 4 (four) times daily. 03/26/2018: Not taking.  . methocarbamol (ROBAXIN) 500 MG tablet Take 500 mg by mouth every 6 (six) hours as needed for muscle spasms. 03/26/2018: Not taking.  Marland Kitchen oxyCODONE (OXY IR/ROXICODONE) 5 MG immediate release tablet Take 5 mg by mouth every 4 (four) hours. Every 30 mins before therapy  03/26/2018: Not taking, she is out and seems to be tolerating pain OK.  . polyethylene glycol (MIRALAX / GLYCOLAX) packet Take 17 g by mouth 2 (two) times daily. (Patient not taking: Reported on 03/25/2018) 03/26/2018: No taking.  . senna-docusate (SENNA-PLUS) 8.6-50 MG tablet Take 2 tablets by mouth 2 (two) times daily.    No facility-administered encounter medications on file as of 03/25/2018.    Fall Risk  03/24/2018 12/29/2017 07/15/2016  Falls in the past year?  Yes Yes No  Comment - - [place] Telephone Survey: data to providers prior to load  Number falls in past yr: 2 or more 2 or more -  Injury with Fall? Yes Yes -  Risk Factor Category  High Fall Risk High Fall Risk -  Risk for fall due to : History of fall(s);Impaired balance/gait;Impaired mobility Impaired balance/gait -  Follow up Education provided;Falls prevention discussed Education provided -   Depression screen [facility] 2/9 03/24/2018 02/03/2018 12/29/2017  Decreased Interest 0 1 0  Down, Depressed, Hopeless 0 1 0  PHQ - 2 Score 0 2 0  Altered sleeping - 0 -  Tired, decreased energy - 1 -  Change in appetite - 0 -  Feeling bad or failure about yourself  - 1 -  Trouble concentrating - 0 -  Moving slowly or fidgety/restless - 0 -  Suicidal thoughts - 0 -  PHQ-9 Score - 4 -  Difficult doing work/chores - Somewhat difficult -   Patient was recently discharged from hospital and all medications have been reviewed.  I will call her in a week to re-evalute how she is doing.  Eulah Pont. Myrtie Neither, MSN, [facility] Gerontological Nurse Practitioner [facility] Care Management [phone]

## 2018-03-26 ENCOUNTER — Encounter (HOSPITAL_COMMUNITY): Payer: Self-pay

## 2018-03-26 ENCOUNTER — Emergency Department (HOSPITAL_COMMUNITY): Payer: Medicare Other

## 2018-03-26 ENCOUNTER — Emergency Department (HOSPITAL_COMMUNITY)
Admission: EM | Admit: 2018-03-26 | Discharge: 2018-03-26 | Disposition: A | Discharge status: Home / Self Care | Payer: Medicare Other | Attending: Emergency Medicine | Admitting: Emergency Medicine

## 2018-03-26 ENCOUNTER — Other Ambulatory Visit: Payer: Self-pay

## 2018-03-26 DIAGNOSIS — Z87891 Personal history of nicotine dependence: Secondary | ICD-10-CM | POA: Insufficient documentation

## 2018-03-26 DIAGNOSIS — N183 Chronic kidney disease, stage 3 (moderate): Secondary | ICD-10-CM | POA: Diagnosis not present

## 2018-03-26 DIAGNOSIS — Z79899 Other long term (current) drug therapy: Secondary | ICD-10-CM | POA: Diagnosis not present

## 2018-03-26 DIAGNOSIS — I129 Hypertensive chronic kidney disease with stage 1 through stage 4 chronic kidney disease, or unspecified chronic kidney disease: Secondary | ICD-10-CM | POA: Insufficient documentation

## 2018-03-26 DIAGNOSIS — Z91040 Latex allergy status: Secondary | ICD-10-CM | POA: Diagnosis not present

## 2018-03-26 DIAGNOSIS — N3000 Acute cystitis without hematuria: Secondary | ICD-10-CM | POA: Diagnosis not present

## 2018-03-26 DIAGNOSIS — Z7982 Long term (current) use of aspirin: Secondary | ICD-10-CM | POA: Diagnosis not present

## 2018-03-26 DIAGNOSIS — E039 Hypothyroidism, unspecified: Secondary | ICD-10-CM | POA: Insufficient documentation

## 2018-03-26 DIAGNOSIS — L89620 Pressure ulcer of left heel, unstageable: Secondary | ICD-10-CM | POA: Diagnosis not present

## 2018-03-26 DIAGNOSIS — R05 Cough: Secondary | ICD-10-CM | POA: Diagnosis not present

## 2018-03-26 DIAGNOSIS — L97521 Non-pressure chronic ulcer of other part of left foot limited to breakdown of skin: Secondary | ICD-10-CM | POA: Diagnosis not present

## 2018-03-26 DIAGNOSIS — L97221 Non-pressure chronic ulcer of left calf limited to breakdown of skin: Secondary | ICD-10-CM | POA: Diagnosis not present

## 2018-03-26 DIAGNOSIS — N390 Urinary tract infection, site not specified: Secondary | ICD-10-CM | POA: Diagnosis not present

## 2018-03-26 DIAGNOSIS — R531 Weakness: Secondary | ICD-10-CM | POA: Diagnosis not present

## 2018-03-26 DIAGNOSIS — R030 Elevated blood-pressure reading, without diagnosis of hypertension: Secondary | ICD-10-CM | POA: Diagnosis not present

## 2018-03-26 DIAGNOSIS — I739 Peripheral vascular disease, unspecified: Secondary | ICD-10-CM | POA: Diagnosis not present

## 2018-03-26 DIAGNOSIS — Z4781 Encounter for orthopedic aftercare following surgical amputation: Secondary | ICD-10-CM | POA: Diagnosis not present

## 2018-03-26 DIAGNOSIS — Z7401 Bed confinement status: Secondary | ICD-10-CM | POA: Diagnosis not present

## 2018-03-26 DIAGNOSIS — M2550 Pain in unspecified joint: Secondary | ICD-10-CM | POA: Diagnosis not present

## 2018-03-26 LAB — URINALYSIS, ROUTINE W REFLEX MICROSCOPIC
Bilirubin Urine: NEGATIVE
Glucose, UA: NEGATIVE mg/dL
KETONES UR: NEGATIVE mg/dL
NITRITE: NEGATIVE
PH: 7 (ref 5.0–8.0)
PROTEIN: NEGATIVE mg/dL
Specific Gravity, Urine: 1.011 (ref 1.005–1.030)
WBC, UA: >50 WBC/hpf — ABNORMAL HIGH (ref 0–5)

## 2018-03-26 LAB — CBC WITH DIFFERENTIAL/PLATELET
BASOS PCT: 1 %
Basophils Absolute: 0 10*3/uL (ref 0.0–0.1)
Eosinophils Absolute: 0.5 10*3/uL (ref 0.0–0.7)
Eosinophils Relative: 7 %
HCT: 27.8 % — ABNORMAL LOW (ref 36.0–46.0)
HEMOGLOBIN: 8.8 g/dL — AB (ref 12.0–15.0)
LYMPHS ABS: 1.6 10*3/uL (ref 0.7–4.0)
LYMPHS PCT: 22 %
MCH: 29 pg (ref 26.0–34.0)
MCHC: 31.7 g/dL (ref 30.0–36.0)
MCV: 91.7 fL (ref 78.0–100.0)
MONOS PCT: 7 %
Monocytes Absolute: 0.5 10*3/uL (ref 0.1–1.0)
NEUTROS ABS: 4.6 10*3/uL (ref 1.7–7.7)
NEUTROS PCT: 63 %
Platelets: 365 10*3/uL (ref 150–400)
RBC: 3.03 MIL/uL — ABNORMAL LOW (ref 3.87–5.11)
RDW: 14.4 % (ref 11.5–15.5)
WBC: 7.2 10*3/uL (ref 4.0–10.5)

## 2018-03-26 LAB — COMPREHENSIVE METABOLIC PANEL
ALBUMIN: 2.9 g/dL — AB (ref 3.5–5.0)
ALT: 9 U/L — ABNORMAL LOW (ref 14–54)
ANION GAP: 10 (ref 5–15)
AST: 13 U/L — ABNORMAL LOW (ref 15–41)
Alkaline Phosphatase: 43 U/L (ref 38–126)
BUN: 15 mg/dL (ref 6–20)
CALCIUM: 9 mg/dL (ref 8.9–10.3)
CHLORIDE: 107 mmol/L (ref 101–111)
CO2: 23 mmol/L (ref 22–32)
Creatinine, Ser: 0.98 mg/dL (ref 0.44–1.00)
GFR calc non Af Amer: 53 mL/min — ABNORMAL LOW (ref >60)
GLUCOSE: 78 mg/dL (ref 65–99)
Potassium: 3.5 mmol/L (ref 3.5–5.1)
SODIUM: 140 mmol/L (ref 135–145)
Total Bilirubin: 0.3 mg/dL (ref 0.3–1.2)
Total Protein: 6.3 g/dL — ABNORMAL LOW (ref 6.5–8.1)

## 2018-03-26 LAB — I-STAT CG4 LACTIC ACID, ED: Lactic Acid, Venous: 0.35 mmol/L — ABNORMAL LOW (ref 0.5–1.9)

## 2018-03-26 MED ORDER — CIPROFLOXACIN HCL 500 MG PO TABS: 500.0000 mg | ORAL_TABLET | Freq: Two times a day (BID) | ORAL | 0 refills | Status: DC

## 2018-03-26 MED ORDER — CIPROFLOXACIN IN D5W 400 MG/200ML IV SOLN
400.0000 mg | Freq: Once | INTRAVENOUS | Status: AC
  Administered 2018-03-26: 400 mg via INTRAVENOUS

## 2018-03-26 MED ORDER — HYDRALAZINE HCL 50 MG PO TABS
50.0000 mg | ORAL_TABLET | Freq: Once | ORAL | Status: AC
Start: 2018-03-26 — End: 2018-03-26
  Administered 2018-03-26: 50 mg via ORAL
  Filled 2018-03-26: qty 1

## 2018-03-26 MED ORDER — CIPROFLOXACIN IN D5W 400 MG/200ML IV SOLN
400.0000 mg | Freq: Once | INTRAVENOUS | Status: DC
  Filled 2018-03-26: qty 200

## 2018-03-26 NOTE — ED Provider Notes (Signed)
[facility] DEPT Provider Note   CSN: [id number] Arrival date & time: 03/26/18  1627     History   Chief Complaint Chief Complaint  Patient presents with  . Wound Infection  . Hypertension    HPI LAKITA SAHLIN is a 83 y.o. female.  HPI 83 year old female with past medical history as below here with generalized weakness.  The patient just underwent a very long hospitalization.  She had a revascularization of her left leg and hospital stay complicated by UTI as well as hemorrhagic shock.  She states she had been recovering well since her discharge 8 days ago.  However, over the last several days, she has had increasing generalized fatigue.  She had some swelling in her leg but states this is actually improving since her hospitalization.  She is also had wounds of her leg but states this is also improving.  Does not appear more red or painful per her report.  She states she is here because she called her PCP and told them that he was feeling weak and had some subjective fevers.  He told her to come to the ER.  She states she does not feel like she needs to be hospitalized again.  She does endorse persistent urinary frequency and urgency.  Denies any other complaints.  Past Medical History:  Diagnosis Date  . Allergy   . Anemia   . CKD (chronic kidney disease), stage II   . Fibromyalgia   . GERD (gastroesophageal reflux disease)   . Hiatal hernia   . Hypertension   . Hypothyroid   . Lymphedema    venous insufficency  . Osteoarthritis   . Peripheral vascular disease ([place])   . Ulcer of knee ([place])    right  . Urinary incontinence   . Varicose veins   . Venous insufficiency     Patient Active Problem List   Diagnosis Date Noted  . Melena 03/09/2018  . Shocks, hemorrhagic ([place]) 03/09/2018  . GI bleed 03/09/2018  . Fecal occult blood test positive   . Hypervolemia   . Acute lower UTI 03/04/2018  . AKI (acute kidney injury) ([place])   .  Arterioloscleroses 03/02/2018  . Anemia 02/23/2018  . Foot ulcer ([place]) 02/23/2018  . Gangrene of foot ([place]) 02/18/2018  . Depression due to physical illness 02/18/2018  . Chronic ulcer of right heel with necrosis of bone ([place]) 12/27/2017  . Peripheral vascular disease of lower extremity with ulceration ([place]) 12/27/2017  . Acute renal failure superimposed on stage 2 chronic kidney disease ([place]) 12/27/2017  . GERD (gastroesophageal reflux disease) 12/27/2017  . Acute blood loss as cause of postoperative anemia 12/13/2017  . Wound of right leg, sequela 12/12/2017  . Open wound of right knee 11/28/2017  . Allergic drug rash   . Sepsis ([place]) 08/29/2017  . CKD (chronic kidney disease), stage III ([place]) 08/29/2017  . Hypothyroid 08/29/2017  . Hypertension 08/29/2017  . Peripheral vascular disease ([place]) 08/29/2017  . Skin ulcer of knee, right, with fat layer exposed ([place]) 08/29/2017  . Acute kidney injury ([place]) 08/29/2017  . Metabolic acidosis 41/66/0630  . Acute hyponatremia 08/29/2017  . Pressure injury of skin 07/25/2017  . Wound, open, knee, lower leg, or ankle with complication, right, initial encounter   . Idiopathic chronic venous hypertension of both lower extremities with ulcer and inflammation ([place]) 07/15/2017  . Skin ulcer of right knee with necrosis of muscle ([place]) 07/15/2017  . Atherosclerosis of artery of right lower extremity (  [place]) 07/11/2017  . Essential hypertension 07/11/2017  . Osteoporosis 07/11/2017  . Cataracts, bilateral 07/11/2017  . Hypothyroidism, acquired 12/27/2014  . Iron deficiency anemia 12/27/2014  . Hiatal hernia 12/27/2014  . Varicose veins of lower extremities with other complications 26/94/8546    Past Surgical History:  Procedure Laterality Date  . AMPUTATION Right 12/15/2017   Procedure: AMPUTATION ABOVE KNEE, RIGHT;  Surgeon: Elam Dutch, MD;  Location: [facility];  Service: Vascular;  Laterality: Right;  . APPLICATION OF WOUND VAC Right 07/18/2017    Procedure: APPLICATION OF WOUND VAC;  Surgeon: [name], MD;  Location: [facility];  Service: Orthopedics;  Laterality: Right;  . CATARACT EXTRACTION W/ INTRAOCULAR LENS  IMPLANT, BILATERAL    . COLONOSCOPY    . COLONOSCOPY WITH PROPOFOL N/A 03/12/2018   Procedure: COLONOSCOPY WITH PROPOFOL;  Surgeon: Clarene Essex, MD;  Location: [facility];  Service: Endoscopy;  Laterality: N/A;  . ESOPHAGOGASTRODUODENOSCOPY Left 03/09/2018   Procedure: ESOPHAGOGASTRODUODENOSCOPY (EGD);  Surgeon: Ronnette Juniper, MD;  Location: [facility];  Service: Gastroenterology;  Laterality: Left;  . I&D EXTREMITY Right 07/18/2017   Procedure: IRRIGATION AND DEBRIDEMENT RIGHT KNEE;  Surgeon: [name], MD;  Location: [facility];  Service: Orthopedics;  Laterality: Right;  . I&D EXTREMITY Right 07/23/2017   Procedure: REPEAT IRRIGATION AND DEBRIDEMENT RIGHT KNEE;  Surgeon: [name], MD;  Location: [facility];  Service: Orthopedics;  Laterality: Right;  . I&D EXTREMITY Right 11/28/2017   Procedure: IRRIGATION AND DEBRIDEMENT RIGHT KNEE , APPLY INSTILLATION VAC;  Surgeon: [name], MD;  Location: [facility];  Service: Orthopedics;  Laterality: Right;  . I&D EXTREMITY Right 12/03/2017   Procedure: REPEAT IRRIGATION AND DEBRIDEMENT RIGHT KNEE AND APPLICATION OF A WOUND VAC.;  Surgeon: [name], MD;  Location: [facility];  Service: Orthopedics;  Laterality: Right;  . KNEE ARTHROSCOPY Left    menisectomy  . LOWER EXTREMITY ANGIOGRAPHY N/A 03/03/2018   Procedure: LOWER EXTREMITY ANGIOGRAPHY;  Surgeon: Serafina Mitchell, MD;  Location: [facility];  Service: Cardiovascular;  Laterality: N/A;  . MULTIPLE TOOTH EXTRACTIONS    . PERIPHERAL VASCULAR ATHERECTOMY Left 03/03/2018   Procedure: PERIPHERAL VASCULAR ATHERECTOMY;  Surgeon: Serafina Mitchell, MD;  Location: [facility];  Service: Cardiovascular;  Laterality: Left;  common femoral and left SFA  . PERIPHERAL VASCULAR INTERVENTION  03/03/2018   Procedure: PERIPHERAL  VASCULAR INTERVENTION;  Surgeon: Serafina Mitchell, MD;  Location: [facility];  Service: Cardiovascular;;  left SFA  . SKIN SPLIT GRAFT Right 07/25/2017   Procedure: Repeat Irrigation and Debridement Right Knee, Split Thickness Skin Graft;  Surgeon: [name], MD;  Location: [facility];  Service: Orthopedics;  Laterality: Right;  . SKIN SPLIT GRAFT Right 12/05/2017   Procedure: SKIN GRAFT SPLIT THICKNESS WOUND KNEE, APPLY VAC;  Surgeon: [name], MD;  Location: [facility];  Service: Orthopedics;  Laterality: Right;  . TONSILLECTOMY       OB History   None      Home Medications    Prior to Admission medications   Medication Sig Start Date End Date Taking? Authorizing Provider  aspirin EC 81 MG tablet Take 81 mg by mouth daily with breakfast.   Yes [provider]  benazepril (LOTENSIN) 40 MG tablet Take 40 mg by mouth daily.   Yes [provider]  Calcium Carbonate-Vitamin D (CALCIUM 600+D) 600-400 MG-UNIT per tablet Take 1 tablet by mouth daily.    Yes [provider]  cetirizine (ZYRTEC) 10 MG tablet Take 10 mg by mouth at bedtime.    Yes [provider]  diltiazem (TIAZAC) 120 MG 24 hr capsule HOLD until seen by your PCP Patient taking differently: Take 120 mg by mouth daily.  03/18/18  Yes Oretha Milch D, MD  famotidine (PEPCID) 40 MG tablet Take 40 mg by mouth daily. 1600    Yes [provider]  fentaNYL (DURAGESIC - DOSED MCG/HR) 25 MCG/HR patch Place 25 mcg onto the skin every 3 (three) days.   Yes [provider]  ferrous sulfate 325 (65 FE) MG tablet Take 325 mg by mouth daily.    Yes [provider]  Multiple Vitamins-Minerals (MULTIVITAMIN WITH MINERALS) tablet Take 1 tablet by mouth daily.   Yes [provider]  oxyCODONE (OXY IR/ROXICODONE) 5 MG immediate release tablet Take 5 mg by mouth every 4 (four) hours. Every 30 mins before therapy    Yes [provider]  SYNTHROID 175 MCG tablet Take  175 mcg by mouth daily before breakfast.  09/20/15  Yes [provider]  vitamin C (ASCORBIC ACID) 500 MG tablet Take 500 mg by mouth daily.   Yes [provider]  atorvastatin (LIPITOR) 10 MG tablet Take 1 tablet (10 mg total) by mouth daily at 6 PM. Patient not taking: Reported on 03/26/2018 03/18/18   Oretha Milch D, MD  ciprofloxacin (CIPRO) 500 MG tablet Take 1 tablet (500 mg total) by mouth every 12 (twelve) hours for 10 days. 03/26/18 04/05/18  Duffy Bruce, MD  docusate sodium (COLACE) 100 MG capsule Take 1 capsule (100 mg total) by mouth daily. Patient not taking: Reported on 03/25/2018 12/24/17   Dana Allan I, MD  feeding supplement, ENSURE ENLIVE, (ENSURE ENLIVE) LIQD Take 237 mLs by mouth 3 (three) times daily with meals. Patient not taking: Reported on 03/26/2018 12/23/17   Dana Allan I, MD  furosemide (LASIX) 20 MG tablet HOLD until seen by your PCP Patient not taking: Reported on 03/25/2018 03/18/18   Oretha Milch D, MD  pantoprazole (PROTONIX) 40 MG tablet Take 1 tablet (40 mg total) by mouth daily. Patient not taking: Reported on 03/26/2018 12/24/17   Dana Allan I, MD  polyethylene glycol [facility]ia [facility]) packet Take 17 g by mouth 2 (two) times daily. Patient not taking: Reported on 03/25/2018 12/23/17   Bonnell Public, MD    Family History Family History  Problem Relation Age of Onset  . Stroke Mother   . Varicose Veins Mother   . Cancer Father        prostate  . Stroke Sister   . Heart disease Sister   . Varicose Veins Sister   . Stroke Maternal Grandmother   . Varicose Veins Sister     Social History Social History   Tobacco Use  . Smoking status: Former Smoker    Packs/day: 0.50    Years: 10.00    Pack years: 5.00    Last attempt to quit: 11/19/1979    Years since quitting: 38.3  . Smokeless tobacco: Never Used  Substance Use Topics  . Alcohol use: Yes    Alcohol/week: 8.4 oz    Types: 14 Glasses of wine per week    Comment:  2 glasses of wine with dinner  . Drug use: No     Allergies   Cinnamon; Ciprofloxacin; Diovan [valsartan]; Food; Latex; Nitrofuran derivatives; Penicillins; Bactrim [sulfamethoxazole-trimethoprim]; Other; and Sulfa antibiotics   Review of Systems Review of Systems  Constitutional: Positive for fatigue.  Genitourinary: Positive for dysuria and frequency.  Neurological: Positive for weakness.  All other systems reviewed and are negative.    Physical Exam Updated Vital Signs BP (!) 176/66   Pulse 83   Temp 98.6 F (37 C) (Oral)   Resp 16   Ht 5\' 3"  (1.6 m)   Wt 81.6 kg (180 lb)   SpO2 96%   BMI 31.89 kg/m   Physical Exam  Constitutional: She is oriented to person, place, and time. She appears well-developed and well-nourished. No distress.  HENT:  Head: Normocephalic and atraumatic.  Eyes: Conjunctivae are normal.  Neck: Neck supple.  Cardiovascular: Normal rate, regular rhythm and normal heart sounds. Exam reveals no friction rub.  No murmur heard. Pulmonary/Chest: Effort normal and breath sounds normal. No respiratory distress. She has no wheezes. She has no rales.  Abdominal: She exhibits no distension.  Musculoskeletal: She exhibits no edema.  Neurological: She is alert and oriented to person, place, and time. She exhibits normal muscle tone.  Skin: Skin is warm. Capillary refill takes less than 2 seconds.  Psychiatric: She has a normal mood and affect.  Nursing note and vitals reviewed.   LOWER EXTREMITY EXAM: LEFT  INSPECTION & PALPATION: Chronic stasis changes.  There are some superficial ulcers present on the back of the foot and shin, with intact granulation tissue.  No warmth erythema.  No drainage.  No purulence.  SENSORY: sensation is intact to light touch in:  Superficial peroneal nerve distribution (over dorsum of foot) Deep peroneal nerve distribution (over first dorsal web space) Sural nerve distribution (over lateral aspect 5th  metatarsal) Saphenous nerve distribution (over medial instep)  MOTOR:  + Motor EHL (great toe dorsiflexion) + FHL (great toe plantar flexion)  + TA (ankle dorsiflexion)  + GSC (ankle plantar flexion)  VASCULAR: 2+ dorsalis pedis and posterior tibialis pulses Capillary refill < 2 sec, toes warm and well-perfused  COMPARTMENTS: Soft, warm, well-perfused No pain with passive extension No parethesias     ED Treatments / Results  Labs (all labs ordered are listed, but only abnormal results are displayed) Labs Reviewed  COMPREHENSIVE METABOLIC PANEL - Abnormal; Notable for the following components:      Result Value   Total Protein 6.3 (*)    Albumin 2.9 (*)    AST 13 (*)    ALT 9 (*)    GFR calc non Af Amer 53 (*)    All other components within normal limits  CBC WITH DIFFERENTIAL/PLATELET - Abnormal; Notable for the following components:   RBC 3.03 (*)    Hemoglobin 8.8 (*)    HCT 27.8 (*)    All other components within normal limits  URINALYSIS, ROUTINE W REFLEX MICROSCOPIC - Abnormal; Notable for the following components:   Color, Urine YELLOW (*)    APPearance HAZY (*)    Hgb urine dipstick SMALL (*)    Leukocytes, UA LARGE (*)    WBC, UA >50 (*)    Bacteria, UA RARE (*)    All other components within normal limits  I-STAT CG4 LACTIC ACID, ED - Abnormal; Notable for the following components:   Lactic Acid, Venous 0.35 (*)    All other components within normal limits  URINE CULTURE  I-STAT CG4 LACTIC ACID, ED    EKG None  Radiology Dg Chest 2 View  Result Date: 03/26/2018 CLINICAL DATA:  Weakness and cough. EXAM: CHEST - 2 VIEW COMPARISON:  03/13/2018. FINDINGS: Stable cardiomediastinal silhouette, heart  size upper limits normal. Thoracic atherosclerosis. Mild increased perihilar markings could represent viral pneumonitis, or vascular congestion. Platelike atelectasis or scarring at the LEFT base. No consolidation or frank edema. IMPRESSION: Mild increased  perihilar markings, without frank consolidation or edema. Similar appearance to priors. Electronically Signed   By: Staci Righter M.D.   On: 03/26/2018 19:32    Procedures Procedures (including critical care time)  Medications Ordered in ED Medications  hydrALAZINE (APRESOLINE) tablet 50 mg (50 mg Oral Given 03/26/18 1848)     Initial Impression / Assessment and Plan / ED Course  I have reviewed the triage vital signs and the nursing notes.  Pertinent labs & imaging results that were available during my care of the patient were reviewed by me and considered in my medical decision making (see chart for details).  Clinical Course as of Mar 27 1947  Thu May 09, 189  309 83 year old female here with generalized weakness.  The patient was just hospitalized.  On review of records, she was treated for UTI but cultures grew back Pseudomonas and Citrobacter that were resistant to the Rocephin that she received.  I suspect her symptoms are due to ongoing UTI given her symptoms.  She is otherwise very well-appearing.  She is afebrile and hemodynamically stable.  She has some chronic wounds to left lower extremity but is being followed closely by the wound care clinic and I do not see any signs of superinfection clinically.  She has a normal white count, normal lactic acid, no evidence of sepsis or systemic illness.  Urinalysis confirms ongoing UTI.  Based on previous sensitivities, will treat with Cipro.  The patient states she has had some mild tremors due to Cipro but no true allergy.  I discussed that the alternative option would be admitting her with IV antibiotics and patient refuses to be admitted.  She states that she can take the Cipro.  She was given a dose here and will discharge with a 10-day course and outpatient follow-up.   [CI]    Clinical Course User Index [CI] Duffy Bruce, MD    Final Clinical Impressions(s) / ED Diagnoses   Final diagnoses:  Acute cystitis without hematuria     ED Discharge Orders        Ordered    ciprofloxacin (CIPRO) 500 MG tablet  Every 12 hours     03/26/18 1945       Duffy Bruce, MD 03/26/18 1948

## 2018-03-26 NOTE — ED Notes (Signed)
Provided pt with [place] and Ginger Ale, permitted by RN.

## 2018-03-26 NOTE — ED Triage Notes (Signed)
Pt BIB EMS from home--care giver concerned that pt may have UTI due to cloudy urine. Also concerned that pt may have infection on left leg r/t wounds-PCP informed pt to come to the ED. Hx of AKA to the right leg. C/o hypertension, but reports not taking HTN medications today.

## 2018-03-26 NOTE — ED Notes (Signed)
[facility] called for transport.  

## 2018-03-26 NOTE — ED Notes (Signed)
[place] husband

## 2018-03-26 NOTE — Patient Outreach (Signed)
Addendum: Pt did have elevated BP yesterday and had not had ANY of her medications when I visited her at 5:00 pm. We got out her 2 antihypertensive medications and she took them in front of me.   Eulah Pont. Myrtie Neither, MSN, [facility] Gerontological Nurse Practitioner [facility] Care Management [phone]

## 2018-03-26 NOTE — ED Notes (Signed)
Bed: WA06 Expected date:  Expected time:  Means of arrival:  Comments: EMS hypertension BKA

## 2018-03-29 DIAGNOSIS — G8929 Other chronic pain: Secondary | ICD-10-CM | POA: Diagnosis not present

## 2018-03-29 DIAGNOSIS — N289 Disorder of kidney and ureter, unspecified: Secondary | ICD-10-CM | POA: Diagnosis not present

## 2018-03-29 DIAGNOSIS — K229 Disease of esophagus, unspecified: Secondary | ICD-10-CM | POA: Diagnosis not present

## 2018-03-29 DIAGNOSIS — I739 Peripheral vascular disease, unspecified: Secondary | ICD-10-CM | POA: Diagnosis not present

## 2018-03-29 DIAGNOSIS — Z899 Acquired absence of limb, unspecified: Secondary | ICD-10-CM | POA: Diagnosis not present

## 2018-03-29 DIAGNOSIS — S91302A Unspecified open wound, left foot, initial encounter: Secondary | ICD-10-CM | POA: Diagnosis not present

## 2018-03-29 DIAGNOSIS — L03119 Cellulitis of unspecified part of limb: Secondary | ICD-10-CM | POA: Diagnosis not present

## 2018-03-29 DIAGNOSIS — Z79899 Other long term (current) drug therapy: Secondary | ICD-10-CM | POA: Diagnosis not present

## 2018-03-30 ENCOUNTER — Other Ambulatory Visit: Payer: Self-pay | Admitting: *Deleted

## 2018-03-30 ENCOUNTER — Other Ambulatory Visit: Payer: Self-pay

## 2018-03-30 DIAGNOSIS — Z89611 Acquired absence of right leg above knee: Secondary | ICD-10-CM | POA: Diagnosis not present

## 2018-03-30 DIAGNOSIS — S81802A Unspecified open wound, left lower leg, initial encounter: Secondary | ICD-10-CM | POA: Diagnosis not present

## 2018-03-30 DIAGNOSIS — L97521 Non-pressure chronic ulcer of other part of left foot limited to breakdown of skin: Secondary | ICD-10-CM | POA: Diagnosis not present

## 2018-03-30 DIAGNOSIS — I739 Peripheral vascular disease, unspecified: Secondary | ICD-10-CM | POA: Diagnosis not present

## 2018-03-30 DIAGNOSIS — I890 Lymphedema, not elsewhere classified: Secondary | ICD-10-CM | POA: Diagnosis not present

## 2018-03-30 DIAGNOSIS — L89620 Pressure ulcer of left heel, unstageable: Secondary | ICD-10-CM | POA: Diagnosis not present

## 2018-03-30 DIAGNOSIS — L97221 Non-pressure chronic ulcer of left calf limited to breakdown of skin: Secondary | ICD-10-CM | POA: Diagnosis not present

## 2018-03-30 DIAGNOSIS — Z4781 Encounter for orthopedic aftercare following surgical amputation: Secondary | ICD-10-CM | POA: Diagnosis not present

## 2018-03-30 DIAGNOSIS — N390 Urinary tract infection, site not specified: Secondary | ICD-10-CM | POA: Diagnosis not present

## 2018-03-30 LAB — URINE CULTURE
Culture: >=100000 — AB
SPECIAL REQUESTS: NORMAL

## 2018-03-30 NOTE — Patient Outreach (Signed)
Follow up phone call. Pt states she is going to see Dr. Linnell Fulling PA or NP today. She needs to get some pain medication and needs to ask about PT and request a wheelchair with pressure relieving cushion and L leg rest that elevates.  Pt has agreed to allow me to come out and visit with her again on Wednesday morning. I would like to see how she and her husband are in did doing transfers with a sliding board. She may also need a HOYER LIFT.  [facility] can try to make other tranportation arrangements, if need be, our BSW has been trying to get the SCAT application filled out fo this couple. Mrs. Bocek told me she felt she and her husband could transfer her into the car with the sliding board and they could go anywhere they want.  Mrs. Wedel has agreed for me to make another home visit on Wednesday morning with my pharmacy student. We will see her then.  Eulah Pont. Myrtie Neither, MSN, [facility] Gerontological Nurse Practitioner [facility] Care Management [phone]

## 2018-03-30 NOTE — Patient Outreach (Signed)
[facility]) Care Management  03/30/2018  Shelby Reese 09/26/1935 [id number]   BSW contacted patient on today's date in an effort to complete a SCAT application. HIPAA identifiers verified. Patient stated she has an appointment this afternoon and is planning to utilize [facility] for transport. Patient has two upcomming appointments on 04/03/18 and 04/06/18. Patients does not have any transportation arranged for these future appointments.  BSW spoke with patient about the SCAT application process. Patient assisted BSW in completion of SCAT application. BSW also informed patient that [facility] Management may be able to assist with transportation through [employer]. Patient interested in utilizing this service for both appointments on 5/17 and 5/20.  Patient disclosed to BSW that she is currently staying in bed at all times but will sit on the edge of the bed for up to two hours at a time. Patient does have a wheelchair and sliding board to assist with transfers. Patient states the wheelchair is a rental and the brakes do not work. Patient would like a new wheelchair that will accommodate her needs. BSW reviewed chart and noted that [name], NP with [facility] Management has placed a request to patients primary physician regarding a wheelchair.   Patient also indicated that she is unable to use the sliding board from the wheelchair to the bed due to the height difference. Patient does not have a hospital bed due to it being "uncomfortable". Patient had her husband move a standard bed into the living room which is where patient resides. Patient stated that she has not gotten out of bed in several days due to having to call 911 for assistance in getting back to bed due to weakness and height difference. Patient no longer has 24 hour in home aides. Patient has 1 CNA for 8 hours to assist with morning care.  Patient states she has not been seen by a PT or OT as indicated on her hospital  discharge summary. Patient confirms a home health RN visits the home twice weekly for dressing changes. Patient gave this BSW verbal permission to contact [facility] to inquire about the status of her therapies. This BSW contacted [facility] and spoke with customer service representative, Lashante. Betha Loa confirmed that a PT visited patients home on 03/21/18 for start of care. There was a concern of her wound status and the PT requested patient got to the emergency room, patient declined. Betha Loa also stated the PT suggested changes in furniture placement to assist with mobility/safety but patient refused. There is no planned follow-up with PT in system but also no discharge date. OT is scheduled to start on 03/31/18.  BSW spoke with patient about the concern of utilizing [employer] at this time due to deconditioning and inability to transfer from wheelchair to bed. Patient continues to feel she is a candidate for this transportation service. BSW plans to speak with co-workers who have visited home and will call patient on 03/31/18 to speak with patient about which transportation service will best meet her needs at this time. Patient in agreement.  Plan: BSW to route note to PCP in efforts to update on patients mobility status as well as therapy inactivity BSW to contact patient on 03/31/18 to discuss transportation needs and options in further detail.  Daneen Schick, BSW, CDP Social Worker Cell # [phone] [email or web address]

## 2018-03-31 ENCOUNTER — Other Ambulatory Visit: Payer: Self-pay

## 2018-03-31 ENCOUNTER — Telehealth: Payer: Self-pay | Admitting: *Deleted

## 2018-03-31 NOTE — Patient Outreach (Signed)
[facility]) Care Management  03/31/2018  Shelby Reese 09-11-1935 [id number]  BSW contacted patient to follow-up on transportation options for upcomming appointments. HIPAA identifiers verified. BSW spoke with patient about concern of patient's ability to attend upcomming physician appointments in a wheelchair due to patient's inability to safely transfer in and out of bed. BSW explained that any transportation services, including SCAT, will not come into the home to assist with transfers.  Patient confirmed with this BSW that she has already arranged [facility] non-emergent transport to her [facility] appointments on both Friday 5/17 and Monday 5/20. Patient indicated she has an appointment with Dr. Shelia Media scheduled in two weeks and she is hopeful she will be able to utilize a wheelchair at that time.   BSW discussed findings from call initiated on 03/30/18 with [facility] regarding lack of therapy services. Patient confirmed that a PT did visit her home on 5/4 and was concerned with the condition of her wounds. Patient stated the PT then called a wound specialist who visited the home. Patient also stated the PT did not perform any therapy and has not returned. BSW inquired on the OT visit scheduled for today per conversation this BSW had with [facility] representative on 03/30/18. Patient denies a visit from OT on today's date.  BSW spoke with patient about the possibility the OT will arrive later this afternoon. BSW reminded patient that [name] has a scheduled home visit for tomorrow. BSW will update Mrs. Spinks on today's conversation with patient.  Plan: BSW to contact patient in the next 2 weeks to follow-up on ability to transfer into a wheelchair for future transportation needs.  Daneen Schick, BSW, CDP Social Worker Cell # [phone] [email or web address]

## 2018-03-31 NOTE — Telephone Encounter (Signed)
Post ED Visit - Positive Culture Follow-up: Successful Patient Follow-Up  Culture assessed and recommendations reviewed by: []  Elenor Quinones, Pharm.D. []  Heide Guile, Pharm.D., BCPS AQ-ID []  Parks Neptune, Pharm.D., BCPS []  Alycia Rossetti, Pharm.D., BCPS []  [place], [place].D., BCPS, AAHIVP []  Legrand Como, Pharm.D., BCPS, AAHIVP []  Salome Arnt, PharmD, BCPS []  Wynell Balloon, PharmD []  Vincenza Hews, PharmD, BCPS  Positive urine culture  []  Patient discharged with antimicrobial prescription and additional treatment is now indicated, Fosfomycin 3gm by mouth x one dose. []  Organism is resistant to prescribed ED discharge antimicrobial []  Patient with positive blood cultures  Changes discussed with ED provider Cortni Couture, PA-C New antibiotic prescription Fosfomycin 3gm by mouth x one dose  Contacted patient, date 03/31/2018, time 1321 and faxed culture results per patient request to Dr. Shelia Media for additonal treatment.     Harlon Flor [facility] 03/31/2018, 1:19 PM

## 2018-04-01 ENCOUNTER — Other Ambulatory Visit: Payer: Self-pay | Admitting: *Deleted

## 2018-04-01 NOTE — Patient Outreach (Addendum)
Shelby Reese) Care Management   04/01/2018  Shelby Reese 08/12/1935 [id number]  Shelby Reese is an 83 y.o. female  Subjective: Pt reports going to see primary care provider yesterday. They discovered she has a profuse yeast rash and she was given an antifungal. OT had not visited as was scheduled on 03/31/18. Pt is basically bed bound as no one can transfer her easily. She does have a sliding board. It is possible to slide her out of bed into the [facility] but transferring back into bed is uphill and the caregivers risk being hurt or it would not be safe for anyone.  Objective:   Review of Systems  Constitutional: Negative.   HENT: Negative.   Eyes: Negative.   Respiratory: Negative.   Cardiovascular: Negative.   Gastrointestinal: Positive for heartburn.  Genitourinary: Negative.   Musculoskeletal: Positive for myalgias.  Skin: Positive for rash.  Neurological:       [place] pain of R leg  Endo/Heme/Allergies: Negative.   Psychiatric/Behavioral: Negative.   BP (!) 167/62 (BP Location: Left Arm, Patient Position: Sitting, Cuff Size: Normal)   Pulse 72   Resp 18   SpO2 94%   Physical Exam  Constitutional: She is oriented to person, place, and time. She appears well-developed and well-nourished.  Cardiovascular:  Murmur heard. Respiratory: Effort normal and breath sounds normal.  GI: Soft. Bowel sounds are normal.  Neurological: She is alert and oriented to person, place, and time.  Skin: There is erythema.  Note mild erythema and warmth to LLE above dsg. There is drainage noted on the leg dsg.  Psychiatric: She has a normal mood and affect.   Outpatient Encounter Medications as of 04/01/2018  Medication Sig Note  . aspirin EC 81 MG tablet Take 81 mg by mouth daily with breakfast.   . benazepril (LOTENSIN) 40 MG tablet Take 40 mg by mouth daily.   . Calcium Carbonate-Vitamin D (CALCIUM 600+D) 600-400 MG-UNIT per tablet Take 1 tablet by mouth daily.    . cetirizine  (ZYRTEC) 10 MG tablet Take 10 mg by mouth at bedtime.    Marland Kitchen diltiazem (TIAZAC) 120 MG 24 hr capsule HOLD until seen by your PCP (Patient taking differently: Take 120 mg by mouth daily. )   . diphenhydrAMINE (BENADRYL) 25 mg capsule Take 25 mg by mouth every 8 (eight) hours as needed for allergies.   Marland Kitchen ELEMENTAL MAGNESIUM PO Take 125 mg by mouth daily.   . famotidine (PEPCID) 40 MG tablet Take 40 mg by mouth daily. 1600    . ferrous sulfate 325 (65 FE) MG tablet Take 325 mg by mouth daily.    Marland Kitchen gabapentin (NEURONTIN) 100 MG capsule Take 100 mg by mouth 2 (two) times daily.   Marland Kitchen glucosamine-chondroitin 500-400 MG tablet Take 1 tablet by mouth 3 (three) times daily.   . Multiple Vitamins-Minerals (MULTIVITAMIN WITH MINERALS) tablet Take 1 tablet by mouth daily.   Marland Kitchen nystatin-triamcinolone (MYCOLOG II) cream Apply 1 application topically 2 (two) times daily.   Marland Kitchen oxyCODONE (OXY IR/ROXICODONE) 5 MG immediate release tablet Take 5 mg by mouth every 4 (four) hours. Every 30 mins before therapy  03/26/2018: Pt needs new rx  . pantoprazole (PROTONIX) 40 MG tablet Take 1 tablet (40 mg total) by mouth daily.   . polycarbophil (FIBERCON) 625 MG tablet Take 625 mg by mouth daily.   Marland Kitchen SYNTHROID 175 MCG tablet Take 175 mcg by mouth daily before breakfast.    . vitamin C (ASCORBIC ACID)  500 MG tablet Take 500 mg by mouth daily.   . [DISCONTINUED] atorvastatin (LIPITOR) 10 MG tablet Take 1 tablet (10 mg total) by mouth daily at 6 PM. (Patient not taking: Reported on 03/26/2018)   . [DISCONTINUED] ciprofloxacin (CIPRO) 500 MG tablet Take 1 tablet (500 mg total) by mouth every 12 (twelve) hours for 10 days.   . [DISCONTINUED] docusate sodium (COLACE) 100 MG capsule Take 1 capsule (100 mg total) by mouth daily. (Patient not taking: Reported on 03/25/2018) 03/26/2018: Not taking.  . [DISCONTINUED] feeding supplement, ENSURE ENLIVE, (ENSURE ENLIVE) LIQD Take 237 mLs by mouth 3 (three) times daily with meals. (Patient not taking:  Reported on 03/26/2018) 03/26/2018: Not using.  . [DISCONTINUED] fentaNYL (DURAGESIC - DOSED MCG/HR) 25 MCG/HR patch Place 25 mcg onto the skin every 3 (three) days. 03/26/2018: Patch was taken off today, has not put on new patch   . [DISCONTINUED] furosemide (LASIX) 20 MG tablet HOLD until seen by your PCP (Patient not taking: Reported on 03/25/2018)   . [DISCONTINUED] polyethylene glycol (MIRALAX / GLYCOLAX) packet Take 17 g by mouth 2 (two) times daily. (Patient not taking: Reported on 03/25/2018) 03/26/2018: No taking.   No facility-administered encounter medications on file as of 04/01/2018.     Functional Status:   In your present state of health, do you have any difficulty performing the following activities: 03/24/2018 02/24/2018  Hearing? N -  Vision? N -  Difficulty concentrating or making decisions? N -  Walking or climbing stairs? Y -  Dressing or bathing? Y -  Doing errands, shopping? Y N  Preparing Food and eating ? Y -  Using the Toilet? Y -  In the past six months, have you accidently leaked urine? Y -  Do you have problems with loss of bowel control? N -  Managing your Medications? N -  Managing your Finances? N -  Housekeeping or managing your Housekeeping? Y -  Some recent data might be hidden    Fall/Depression Screening:    Fall Risk  03/24/2018 12/29/2017 07/15/2016  Falls in the past year? Yes Yes No  Comment - - Emmi Telephone Survey: data to providers prior to load  Number falls in past yr: 2 or more 2 or more -  Injury with Fall? Yes Yes -  Risk Factor Category  High Fall Risk High Fall Risk -  Risk for fall due to : History of fall(s);Impaired balance/gait;Impaired mobility Impaired balance/gait -  Follow up Education provided;Falls prevention discussed Education provided -   [facility] 2/9 Scores 03/24/2018 02/03/2018 12/29/2017  PHQ - 2 Score 0 2 0  PHQ- 9 Score - 4 -    Assessment:  Anemia                          PVD                          Candidiasis  Plan:  Call MD to  ask about pain medication (oxycodone)            Call [facility] to verify PT/OT is going to engage.             Completed MOST forms for Mr and Mrs. Cocuzza, both are DNRs.              Follow up in one week. Provide toenail debridement at that time.  Eulah Pont. Adom Schoeneck, MSN, GNP-BC Gerontological  Nurse Practitioner [facility] [phone]  Addendum: Called [facility], initial PT/OT eval done, however, Mrs. Blanchett was uncooperative and refusing recommended treatments and safe transfer methods, therefore, no further PT/OT home visits were continued. I called Mrs. Montesinos back and explained why she did not get any further visits and she is very outspoken about what she will and will not do. I explained the 2 options for safe transferring for her and caregivers would be 1)[facility] bed that can be adjusted to w/c height for sliding board transfer or 2)hoyer lift. She is adamant about refusing both of these options. She has it figured out that she can use sofa cushions to raise the height of the w/c seat so she can slide in and out. This is not safe as it raises her too high in the W/C for safe sitting and I have advised her of such. CCS

## 2018-04-02 ENCOUNTER — Encounter: Payer: Self-pay | Admitting: *Deleted

## 2018-04-03 ENCOUNTER — Encounter (HOSPITAL_COMMUNITY): Payer: Medicare Other

## 2018-04-06 ENCOUNTER — Ambulatory Visit: Payer: Medicare Other | Admitting: Surgery

## 2018-04-07 ENCOUNTER — Other Ambulatory Visit: Payer: Self-pay | Admitting: *Deleted

## 2018-04-07 NOTE — Patient Outreach (Addendum)
Home visit in lieu of transition of care call. Today, pt informs me she would like to go to SNF for rehab to gain strength since home health has not come to do therapy with her. We discussed that any licensed therapist priority would be safety for her and for them. I asked her if she would be willing to go along with the proposed plan of care that the SNF will advise and she says she will.  I debrided her L toenails and provided foot care.  I will call Dr. Pennie Banter office and tell him what Shelby Reese is asking and we can investigate if she is eligible for another SNF stay.  I will stay in touch this week with updates.  BP (!) 132/56   Pulse 83   Resp 18   SpO2 94%  RRR with murmur Lungs are clear L extremity appears less edematous, erythemic and not hot to touch. Dsg intact.  Shelby Pont. Myrtie Neither, MSN, [facility] Gerontological Nurse Practitioner [facility] Care Management [phone]

## 2018-04-10 ENCOUNTER — Other Ambulatory Visit: Payer: Self-pay

## 2018-04-10 ENCOUNTER — Ambulatory Visit: Payer: Self-pay

## 2018-04-10 NOTE — Patient Outreach (Signed)
[facility] [facility]) Care Management  04/10/2018  Shelby Reese Mar 30, 1935 [id number]  Transportation request received from [facility], [employer].  BSW arranged transportation via My AppointMate for appointment at [facility] on 04/21/18 @ 3:00.    Ronn Melena, BSW Social Worker [phone]

## 2018-04-10 NOTE — Patient Outreach (Signed)
[facility]) Care Management  04/10/2018  SHAWNTA SCHLEGEL 05/20/1935 [id number]   Patient scheduled to be contacted today by this BSW to follow-up on transportation needs and ability to utilize SCAT. Upon chart review it is noted that patient discussed desire for inpatient rehabilitation with [name], [place]. BSW contacted Mrs. Spinks to discuss patients needs regarding transportation resources. Mrs. Myrtie Neither advised this BSW that patient is incapable of utilizing SCAT benefit at this time. BSW will remain on care team and plan to assist patient in SCAT application process once she is physically capable of utilizing benefit. BSW will await communication from Mrs. Spinks in regards to when assistance will be needed.  Daneen Schick, BSW, CDP Social Worker Cell # [phone] [email or web address]

## 2018-04-15 ENCOUNTER — Encounter: Payer: Self-pay | Admitting: *Deleted

## 2018-04-15 ENCOUNTER — Other Ambulatory Visit: Payer: Self-pay | Admitting: *Deleted

## 2018-04-15 NOTE — Patient Outreach (Addendum)
Telephone call to pt to inform her Dr. Shelia Media gave his consent for her to seek SNF placement for rehabilitation. Informed that admission FL2 form has been done by me and passed to our LCSW, Nat Christen, who will fax the form to the designated facilities.  Pt reports she is doing OK. No new problems. She is working on a new computer today.  Either, Di Kindle or myself will advise her of progress with upcoming placement.  Eulah Pont. Myrtie Neither, MSN, [facility] Gerontological Nurse Practitioner [facility] Care Management [phone]

## 2018-04-15 NOTE — Patient Outreach (Signed)
[facility]) Care Management  04/15/2018  LASHONNA RIEKE 09-20-1935 [id number]  CSW was able to make initial contact with patient today to perform phone assessment, as well as assess and assist with social work needs and services.  CSW introduced self, explained role and types of services provided through [facility] [facility] ([facility]).  CSW further explained to patient that CSW works with patient's Geriatric Nurse Practitioner, also with [facility], [name]. CSW then explained the reason for the call, indicating that Ms. Spinks thought that patient would benefit from social work services and resources to assist with short-term placement into a skilled nursing facility for rehabilitative services.  CSW obtained two HIPAA compliant identifiers from patient, which included patient's name and date of birth. Patient admitted that she is ready to receive extensive therapies (both physical and occupational) through a skilled nursing facility of her choice, providing CSW with a list of facilities where she would like to receive her treatment.  These facilities include all of the following: [place] CSW obtained patient's completed and signed FL-2 Form from Ms. Spinks and faxed to all of the above facilities today.  CSW is currently awaiting bed offers.  CSW agreed to follow-up with patient as soon as bed offers are received.  No additional social work needs have been identified at this time. [facility] CM Care Plan Problem One     Most Recent Value  Care Plan Problem One  Level of care issues.  Role Documenting the Problem One  Clinical Social Worker  Care Plan for Problem One  Active  [facility] CM Short Term Goal #1   Patient will provide CSW with a list of skilled nursing facilities where she would  like to receive short-term rehabilitative services, within the next 24 hours.  [facility] CM Short Term Goal #1 Start Date  04/15/18  [facility] CM Short Term Goal #1 Met Date  04/15/18  Interventions for Short Term Goal #1  CSW will fax patient's completed and signed FL-2 Form to all skilled nursing facilities of choice to pursue bed offers.  [facility] CM Short Term Goal #2   Patient will tour facilities of interest within the next week to determine where she would like to go for rehabilitative services, based on bed offers received.  [facility] CM Short Term Goal #2 Start Date  04/08/18  Interventions for Short Term Goal #2  CSW will report bed offers received to patient so that patient can decide where she would like to go to receive rehabilitative services.    Nat Christen, BSW, MSW, LCSW  Licensed Education officer, environmental Health System  Mailing [place] [address] [place], [place] [address] Physical Address-[address], [place], [place] [address] Toll Free Main # [phone] Fax # [phone] Cell # [phone]  Office # [phone] [email or web address]

## 2018-04-17 ENCOUNTER — Other Ambulatory Visit: Payer: Self-pay | Admitting: *Deleted

## 2018-04-17 NOTE — Patient Outreach (Signed)
[facility]) Care Management  04/17/2018  Shelby Reese 11-May-1935 [id number]   CSW was able to make contact with patient today to follow-up regarding placement into an assisted living facility for short-term rehabilitative services.  CSW was able to fax patient's FL-2 Form to all facilities of interest, which include all of the following: [name] admits that she has not received a call from any of the facilities with regards to them expressing an interest, nor has CSW.  CSW agreed to contact the admissions coordinator at each of the facilities to see if they have female beds available and if they are able to accommodate patient's needs.  CSW agreed to follow-up with patient as soon as CSW has information to report.  CSW will also follow-up with [name], Geriatric Nurse Practitioner, also with [facility]. [facility] CM Care Plan Problem One     Most Recent Value  Care Plan Problem One  Level of care issues.  Role Documenting the Problem One  Clinical Social Worker  Care Plan for Problem One  Active  [facility] CM Short Term Goal #1   Patient will provide CSW with a list of skilled nursing facilities where she would like to receive short-term rehabilitative services, within the next 24 hours.  [facility] CM Short Term Goal #1 Start Date  04/15/18  [facility] CM Short Term Goal #1 Met Date  04/15/18  [facility] CM Short Term Goal #2   Patient will tour facilities of interest within the next week to determine where she would like to go for rehabilitative services, based on bed offers received.  [facility] CM Short Term Goal #2 Start Date  04/08/18  [facility] CM Short Term Goal #2 Met Date  04/17/18  Interventions for Short Term Goal #2  Patient provided CSW with a list of skilled nursing facilities of interest.  [facility] CM Short  Term Goal #3  Patient will contact facilities of interest to see if they are willing to make bed offers, within the next week.  [facility] CM Short Term Goal #3 Start Date  04/17/18  Interventions for Short Tern Goal #3  CSW will fax patient's FL-2 Form to all facilities of interest and await bed offers.    Nat Christen, BSW, MSW, LCSW  Licensed Education officer, environmental Health System  Mailing [place] [address] [place], [place] [address] Physical Address-[address], [place], [place] [address] Toll Free Main # [phone] Fax # [phone] Cell # [phone]  Office # [phone] [email or web address]

## 2018-04-18 DIAGNOSIS — R262 Difficulty in walking, not elsewhere classified: Secondary | ICD-10-CM | POA: Diagnosis not present

## 2018-04-18 DIAGNOSIS — L97823 Non-pressure chronic ulcer of other part of left lower leg with necrosis of muscle: Secondary | ICD-10-CM | POA: Diagnosis not present

## 2018-04-18 DIAGNOSIS — M6281 Muscle weakness (generalized): Secondary | ICD-10-CM | POA: Diagnosis not present

## 2018-04-18 DIAGNOSIS — I70245 Atherosclerosis of native arteries of left leg with ulceration of other part of foot: Secondary | ICD-10-CM | POA: Diagnosis not present

## 2018-04-18 DIAGNOSIS — I10 Essential (primary) hypertension: Secondary | ICD-10-CM | POA: Diagnosis not present

## 2018-04-18 DIAGNOSIS — L89620 Pressure ulcer of left heel, unstageable: Secondary | ICD-10-CM | POA: Diagnosis not present

## 2018-04-20 DIAGNOSIS — M6281 Muscle weakness (generalized): Secondary | ICD-10-CM | POA: Diagnosis not present

## 2018-04-20 DIAGNOSIS — I10 Essential (primary) hypertension: Secondary | ICD-10-CM | POA: Diagnosis not present

## 2018-04-20 DIAGNOSIS — I70245 Atherosclerosis of native arteries of left leg with ulceration of other part of foot: Secondary | ICD-10-CM | POA: Diagnosis not present

## 2018-04-20 DIAGNOSIS — R262 Difficulty in walking, not elsewhere classified: Secondary | ICD-10-CM | POA: Diagnosis not present

## 2018-04-20 DIAGNOSIS — L97823 Non-pressure chronic ulcer of other part of left lower leg with necrosis of muscle: Secondary | ICD-10-CM | POA: Diagnosis not present

## 2018-04-20 DIAGNOSIS — L89620 Pressure ulcer of left heel, unstageable: Secondary | ICD-10-CM | POA: Diagnosis not present

## 2018-04-21 ENCOUNTER — Ambulatory Visit: Payer: Medicare Other

## 2018-04-21 ENCOUNTER — Other Ambulatory Visit: Payer: Self-pay | Admitting: *Deleted

## 2018-04-21 ENCOUNTER — Encounter (HOSPITAL_COMMUNITY): Payer: Medicare Other

## 2018-04-21 NOTE — Progress Notes (Deleted)
VASCULAR & VEIN SPECIALISTS OF [place] HISTORY AND PHYSICAL   History of Present Illness:  Patient is a 83 y.o. year old female who presents for evaluation of her left LE s/p angiogram and intervention.  The patient has previously undergone a right leg amputation.  She has a large wound on her left foot.  Amputation has been recommended however the patient does not wish to have this done and wants to exhaust all possible options for proceeding with amputation versus palliative care.    Past Medical History:  Diagnosis Date  . Allergy   . Anemia   . CKD (chronic kidney disease), stage II   . Fibromyalgia   . GERD (gastroesophageal reflux disease)   . Hiatal hernia   . Hypertension   . Hypothyroid   . Lymphedema    venous insufficency  . Osteoarthritis   . Peripheral vascular disease ([place])   . Ulcer of knee ([place])    right  . Urinary incontinence   . Varicose veins   . Venous insufficiency     Past Surgical History:  Procedure Laterality Date  . AMPUTATION Right 12/15/2017   Procedure: AMPUTATION ABOVE KNEE, RIGHT;  Surgeon: Elam Dutch, MD;  Location: [place];  Service: Vascular;  Laterality: Right;  . APPLICATION OF WOUND VAC Right 07/18/2017   Procedure: APPLICATION OF WOUND VAC;  Surgeon: [name], MD;  Location: [place];  Service: Orthopedics;  Laterality: Right;  . CATARACT EXTRACTION W/ INTRAOCULAR LENS  IMPLANT, BILATERAL    . COLONOSCOPY    . COLONOSCOPY WITH PROPOFOL N/A 03/12/2018   Procedure: COLONOSCOPY WITH PROPOFOL;  Surgeon: Clarene Essex, MD;  Location: [place];  Service: Endoscopy;  Laterality: N/A;  . ESOPHAGOGASTRODUODENOSCOPY Left 03/09/2018   Procedure: ESOPHAGOGASTRODUODENOSCOPY (EGD);  Surgeon: Ronnette Juniper, MD;  Location: [place];  Service: Gastroenterology;  Laterality: Left;  . I&D EXTREMITY Right 07/18/2017   Procedure: IRRIGATION AND DEBRIDEMENT RIGHT KNEE;  Surgeon: [name], MD;  Location: [place];  Service: Orthopedics;   Laterality: Right;  . I&D EXTREMITY Right 07/23/2017   Procedure: REPEAT IRRIGATION AND DEBRIDEMENT RIGHT KNEE;  Surgeon: [name], MD;  Location: [place];  Service: Orthopedics;  Laterality: Right;  . I&D EXTREMITY Right 11/28/2017   Procedure: IRRIGATION AND DEBRIDEMENT RIGHT KNEE , APPLY INSTILLATION VAC;  Surgeon: [name], MD;  Location: [place];  Service: Orthopedics;  Laterality: Right;  . I&D EXTREMITY Right 12/03/2017   Procedure: REPEAT IRRIGATION AND DEBRIDEMENT RIGHT KNEE AND APPLICATION OF A WOUND VAC.;  Surgeon: [name], MD;  Location: [place];  Service: Orthopedics;  Laterality: Right;  . KNEE ARTHROSCOPY Left    menisectomy  . LOWER EXTREMITY ANGIOGRAPHY N/A 03/03/2018   Procedure: LOWER EXTREMITY ANGIOGRAPHY;  Surgeon: Serafina Mitchell, MD;  Location: [place] CV [facility];  Service: Cardiovascular;  Laterality: N/A;  . MULTIPLE TOOTH EXTRACTIONS    . PERIPHERAL VASCULAR ATHERECTOMY Left 03/03/2018   Procedure: PERIPHERAL VASCULAR ATHERECTOMY;  Surgeon: Serafina Mitchell, MD;  Location: [place] [facility];  Service: Cardiovascular;  Laterality: Left;  common femoral and left SFA  . PERIPHERAL VASCULAR INTERVENTION  03/03/2018   Procedure: PERIPHERAL VASCULAR INTERVENTION;  Surgeon: Serafina Mitchell, MD;  Location: [place] [facility];  Service: Cardiovascular;;  left SFA  . SKIN SPLIT GRAFT Right 07/25/2017   Procedure: Repeat Irrigation and Debridement Right Knee, Split Thickness Skin Graft;  Surgeon: [name], MD;  Location: [place];  Service: Orthopedics;  Laterality: Right;  . SKIN SPLIT GRAFT Right 12/05/2017   Procedure: SKIN GRAFT SPLIT THICKNESS WOUND KNEE, APPLY VAC;  Surgeon: [name], MD;  Location: [place];  Service: Orthopedics;  Laterality: Right;  . TONSILLECTOMY      ROS:   General:  No weight loss, Fever, chills  HEENT: No recent headaches, no nasal bleeding, no visual changes, no sore throat  Neurologic: No dizziness, blackouts, seizures. No  recent symptoms of stroke or mini- stroke. No recent episodes of slurred speech, or temporary blindness.  Cardiac: No recent episodes of chest pain/pressure, no shortness of breath at rest.  No shortness of breath with exertion.  Denies history of atrial fibrillation or irregular heartbeat  Vascular: No history of rest pain in feet.  No history of claudication.  No history of non-healing ulcer, No history of DVT   Pulmonary: No home oxygen, no productive cough, no hemoptysis,  No asthma or wheezing  Musculoskeletal:  [ ]  Arthritis, [ ]  Low back pain,  [ ]  Joint pain  Hematologic:No history of hypercoagulable state.  No history of easy bleeding.  No history of anemia  Gastrointestinal: No hematochezia or melena,  No gastroesophageal reflux, no trouble swallowing  Urinary: [ ]  chronic Kidney disease, [ ]  on HD - [ ]  MWF or [ ]  TTHS, [ ]  Burning with urination, [ ]  Frequent urination, [ ]  Difficulty urinating;   Skin: No rashes  Psychological: No history of anxiety,  No history of depression  Social History Social History   Tobacco Use  . Smoking status: Former Smoker    Packs/day: 0.50    Years: 10.00    Pack years: 5.00    Last attempt to quit: 11/19/1979    Years since quitting: 38.4  . Smokeless tobacco: Never Used  Substance Use Topics  . Alcohol use: Yes    Alcohol/week: 8.4 oz    Types: 14 Glasses of wine per week    Comment: 2 glasses of wine with dinner  . Drug use: No    Family History Family History  Problem Relation Age of Onset  . Stroke Mother   . Varicose Veins Mother   . Cancer Father        prostate  . Stroke Sister   . Heart disease Sister   . Varicose Veins Sister   . Stroke Maternal Grandmother   . Varicose Veins Sister     Allergies  Allergies  Allergen Reactions  . Cinnamon Hives  . Ciprofloxacin Other (See Comments)    TREMORS  . Diovan [Valsartan] Other (See Comments)    Extreme vertigo  . Food Diarrhea and Other (See Comments)     ORANGE JUICE   UPSET STOMACH  . Latex Rash and Other (See Comments)    Rash/inflammation due to exposure  . Nitrofuran Derivatives Hives and Rash    "Full body rash"  . Penicillins Hives and Swelling    *tolerated Ceftriaxone September 2018 Has patient had a PCN reaction causing immediate rash, facial/tongue/throat swelling, SOB or lightheadedness with hypotension:No--severe irritation at the injection site Has patient had a PCN reaction causing severe rash involving mucus membranes or skin necrosis:Unknown Has patient had a PCN reaction that required hospitalization:No Has patient had a PCN reaction occurring within the last 10 years:Yes If all of the above answers are "NO", then may proceed with  . Bactrim [Sulfamethoxazole-Trimethoprim] Diarrhea and Nausea Only  . Other Rash    Mycins, Strawberry, Oranges  . Sulfa Antibiotics Diarrhea and  Nausea Only     Current Outpatient Medications  Medication Sig Dispense Refill  . aspirin EC 81 MG tablet Take 81 mg by mouth daily with breakfast.    . benazepril (LOTENSIN) 40 MG tablet Take 40 mg by mouth daily.    . Calcium Carbonate-Vitamin D (CALCIUM 600+D) 600-400 MG-UNIT per tablet Take 1 tablet by mouth daily.     . cetirizine (ZYRTEC) 10 MG tablet Take 10 mg by mouth at bedtime.     Marland Kitchen diltiazem (TIAZAC) 120 MG 24 hr capsule HOLD until seen by your PCP (Patient taking differently: Take 120 mg by mouth daily. ) 30 capsule 0  . diphenhydrAMINE (BENADRYL) 25 mg capsule Take 25 mg by mouth every 8 (eight) hours as needed for allergies.    Marland Kitchen ELEMENTAL MAGNESIUM PO Take 125 mg by mouth daily.    . famotidine (PEPCID) 40 MG tablet Take 40 mg by mouth daily. 1600     . ferrous sulfate 325 (65 FE) MG tablet Take 325 mg by mouth daily.     Marland Kitchen gabapentin (NEURONTIN) 100 MG capsule Take 100 mg by mouth 2 (two) times daily.    Marland Kitchen glucosamine-chondroitin 500-400 MG tablet Take 1 tablet by mouth 3 (three) times daily.    . Multiple Vitamins-Minerals  (MULTIVITAMIN WITH MINERALS) tablet Take 1 tablet by mouth daily.    Marland Kitchen nystatin-triamcinolone (MYCOLOG II) cream Apply 1 application topically 2 (two) times daily.    Marland Kitchen oxyCODONE (OXY IR/ROXICODONE) 5 MG immediate release tablet Take 5 mg by mouth every 4 (four) hours. Every 30 mins before therapy     . pantoprazole (PROTONIX) 40 MG tablet Take 1 tablet (40 mg total) by mouth daily. 30 tablet 0  . polycarbophil (FIBERCON) 625 MG tablet Take 625 mg by mouth daily.    Marland Kitchen SYNTHROID 175 MCG tablet Take 175 mcg by mouth daily before breakfast.     . vitamin C (ASCORBIC ACID) 500 MG tablet Take 500 mg by mouth daily.     No current facility-administered medications for this visit.     Physical Examination  There were no vitals filed for this visit.  There is no height or weight on file to calculate BMI.  General:  Alert and oriented, no acute distress HEENT: Normal Neck: No bruit or JVD Pulmonary: Clear to auscultation bilaterally Cardiac: Regular Rate and Rhythm without murmur Abdomen: Soft, non-tender, non-distended, no mass, no scars Skin: No rash Extremity Pulses:  2+ radial, brachial, femoral, dorsalis pedis, posterior tibial pulses bilaterally Musculoskeletal: No deformity or edema  Neurologic: Upper and lower extremity motor 5/5 and symmetric  DATA:  Post angiogram 03/03/2018 by Dr. 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  There was initially a dissection which had to be covered with a 7 x 40 self-expanding stent             #3  Two-vessel runoff via the anterior tibial and peroneal artery.      ASSESSMENT: ***   PLAN: ***   Ruta Hinds, MD Vascular and  Vein Specialists of [facility] Office: [phone] Pager: [phone]

## 2018-04-21 NOTE — Patient Outreach (Signed)
Call to pt to verify that she is planning on going to her vascular appointment today and that she can get in her wheelchair and back in bed with her in home assistance. She reports she feels she needs to go by ambulance [facility]) as she did get out of bed with assistance into her new wheelchair but could not get back in bed and they had to call the EMS to get her back in bed.  I have called [facility], to try to make the necessary changes requested so that she can get to this very important appointment.  Shelby Reese. Myrtie Neither, MSN, [facility] Gerontological Nurse Practitioner [facility] Care Management [phone]

## 2018-04-22 ENCOUNTER — Other Ambulatory Visit: Payer: Self-pay | Admitting: *Deleted

## 2018-04-22 DIAGNOSIS — L89620 Pressure ulcer of left heel, unstageable: Secondary | ICD-10-CM | POA: Diagnosis not present

## 2018-04-22 DIAGNOSIS — I70245 Atherosclerosis of native arteries of left leg with ulceration of other part of foot: Secondary | ICD-10-CM | POA: Diagnosis not present

## 2018-04-22 DIAGNOSIS — L97823 Non-pressure chronic ulcer of other part of left lower leg with necrosis of muscle: Secondary | ICD-10-CM | POA: Diagnosis not present

## 2018-04-22 DIAGNOSIS — M6281 Muscle weakness (generalized): Secondary | ICD-10-CM | POA: Diagnosis not present

## 2018-04-22 DIAGNOSIS — I10 Essential (primary) hypertension: Secondary | ICD-10-CM | POA: Diagnosis not present

## 2018-04-22 DIAGNOSIS — R262 Difficulty in walking, not elsewhere classified: Secondary | ICD-10-CM | POA: Diagnosis not present

## 2018-04-22 NOTE — Patient Outreach (Signed)
[facility]) Care Management  04/22/2018  BRYLA BUREK Jul 24, 1935 [id number]   CSW was able to make contact with patient today to follow-up with her regarding short-term placement into a skilled nursing facility for rehabilitative services.  CSW explained to patient that CSW has not received any bed offers from the facilities where we faxed patient's FL-2 Form.  CSW, along with patient, agreed to contact each of these facilities today to follow-up. CSW also agreed to fax patient's FL-2 Form to another list of facilities, provided to patient by CSW.  CSW then agreed to follow-up with patient to report findings.  CSW is hopeful that a nursing facility will make a bed offer on patient within the next few days so that we can get patient placed by the weekend.  Patient voiced understanding and was agreeable to this plan. THN CM Care Plan Problem One     Most Recent Value  Care Plan Problem One  Level of care issues.  Role Documenting the Problem One  Clinical Social Worker  Care Plan for Problem One  Active  [facility] CM Short Term Goal #1   Patient will provide CSW with a list of skilled nursing facilities where she would like to receive short-term rehabilitative services, within the next 24 hours.  THN CM Short Term Goal #1 Start Date  04/15/18  [facility] CM Short Term Goal #1 Met Date  04/15/18  THN CM Short Term Goal #2   Patient will tour facilities of interest within the next week to determine where she would like to go for rehabilitative services, based on bed offers received.  THN CM Short Term Goal #2 Start Date  04/08/18  [facility] CM Short Term Goal #2 Met Date  04/17/18  THN CM Short Term Goal #3  Patient will contact facilities of interest to see if they are willing to make bed offers, within the next week.  THN CM Short Term Goal #3 Start Date  04/17/18  [facility] CM Short Term Goal #3 Met Date  04/22/18  Interventions for Short Tern Goal #3  Patient and CSW have agreed to contact facilities  of interest that have received patient's FL-2 Form to see if they are willing to accept patient into their facility.  THN CM Short Term Goal #4  Patient will provide CSW with another list of facilities that she may be interested in for placement, within the next 48 hours, as none of the facilities that we have faxed patient's FL-2 Form to are making bed offers.  THN CM Short Term Goal #4 Start Date  04/22/18  Interventions for Short Term Goal #4  CSW has agreed to fax patient's FL-2 Form toadditional facilities of interest.    Nat Christen, BSW, MSW, LCSW  Licensed Clinical Social Worker  [place]  Mailing [place] [address], [place], [place] [address] Physical Address-[address], [place],  [address] Toll Free Main # [phone] Fax # [phone] Cell # [phone]  Office # [phone] [email or web address]

## 2018-04-24 ENCOUNTER — Other Ambulatory Visit: Payer: Self-pay | Admitting: *Deleted

## 2018-04-24 DIAGNOSIS — I10 Essential (primary) hypertension: Secondary | ICD-10-CM | POA: Diagnosis not present

## 2018-04-24 DIAGNOSIS — L89620 Pressure ulcer of left heel, unstageable: Secondary | ICD-10-CM | POA: Diagnosis not present

## 2018-04-24 DIAGNOSIS — R262 Difficulty in walking, not elsewhere classified: Secondary | ICD-10-CM | POA: Diagnosis not present

## 2018-04-24 DIAGNOSIS — L97823 Non-pressure chronic ulcer of other part of left lower leg with necrosis of muscle: Secondary | ICD-10-CM | POA: Diagnosis not present

## 2018-04-24 DIAGNOSIS — M6281 Muscle weakness (generalized): Secondary | ICD-10-CM | POA: Diagnosis not present

## 2018-04-24 DIAGNOSIS — I70245 Atherosclerosis of native arteries of left leg with ulceration of other part of foot: Secondary | ICD-10-CM | POA: Diagnosis not present

## 2018-04-24 NOTE — Patient Outreach (Signed)
[facility] [facility]) Care Management  04/24/2018  Shelby Reese [birth year] [id number]   CSW was able to make contact with patient today to explain that no bed offers have been received as of yet, but that CSW plans to make additional calls today to see if any of the skilled facilities we faxed her FL-2 Form to have female beds available.  CSW has faxed patient's FL-2 Form to all facilities of interest, but received no bed offers.  CSW then faxed patient's FL-2 Form to all skilled nursing facilities in [facility], but again, received no bed offers.  CSW will make follow-up calls today and then contact patient next week to report findings. THN CM Care Plan Problem One     Most Recent Value  Care Plan Problem One  Level of care issues.  Role Documenting the Problem One  Clinical Social Worker  Care Plan for Problem One  Active  [facility] CM Short Term Goal #1   Patient will provide CSW with a list of skilled nursing facilities where she would like to receive short-term rehabilitative services, within the next 24 hours.  THN CM Short Term Goal #1 Start Date  04/15/18  [facility] CM Short Term Goal #1 Met Date  04/15/18  THN CM Short Term Goal #2   Patient will tour facilities of interest within the next week to determine where she would like to go for rehabilitative services, based on bed offers received.  THN CM Short Term Goal #2 Start Date  04/08/18  [facility] CM Short Term Goal #2 Met Date  04/17/18  THN CM Short Term Goal #3  Patient will contact facilities of interest to see if they are willing to make bed offers, within the next week.  THN CM Short Term Goal #3 Start Date  04/17/18  [facility] CM Short Term Goal #3 Met Date  04/22/18  [facility] CM Short Term Goal #4  Patient will provide CSW with another list of facilities that she may be interested in for placement, within the next 48 hours, as none of the facilities that we have faxed patient's FL-2 Form to are making bed offers.  THN CM Short Term Goal #4  Start Date  04/22/18  [facility] CM Short Term Goal #4 Met Date  04/24/18  Interventions for Short Term Goal #4  CSW has agreed to fax patient's FL-2 Form toadditional facilities of interest.    Shelby Reese, BSW, MSW, LCSW  Licensed Clinical Social Worker  [place]  Mailing [place] [address] [place], [place] [address] Physical Address-[address], [place], [place] [address] Toll Free Main # [phone] Fax # [phone] Cell # [phone]  Office # [phone] [email or web address]

## 2018-04-27 DIAGNOSIS — R262 Difficulty in walking, not elsewhere classified: Secondary | ICD-10-CM | POA: Diagnosis not present

## 2018-04-27 DIAGNOSIS — M6281 Muscle weakness (generalized): Secondary | ICD-10-CM | POA: Diagnosis not present

## 2018-04-27 DIAGNOSIS — I70245 Atherosclerosis of native arteries of left leg with ulceration of other part of foot: Secondary | ICD-10-CM | POA: Diagnosis not present

## 2018-04-27 DIAGNOSIS — L89620 Pressure ulcer of left heel, unstageable: Secondary | ICD-10-CM | POA: Diagnosis not present

## 2018-04-27 DIAGNOSIS — I10 Essential (primary) hypertension: Secondary | ICD-10-CM | POA: Diagnosis not present

## 2018-04-27 DIAGNOSIS — L97823 Non-pressure chronic ulcer of other part of left lower leg with necrosis of muscle: Secondary | ICD-10-CM | POA: Diagnosis not present

## 2018-04-28 ENCOUNTER — Emergency Department (HOSPITAL_COMMUNITY): Payer: Medicare Other

## 2018-04-28 ENCOUNTER — Encounter (HOSPITAL_COMMUNITY): Payer: Self-pay

## 2018-04-28 ENCOUNTER — Emergency Department (HOSPITAL_COMMUNITY)
Admission: EM | Admit: 2018-04-28 | Discharge: 2018-04-29 | Disposition: A | Discharge status: Home / Self Care | Payer: Medicare Other | Attending: Emergency Medicine | Admitting: Emergency Medicine

## 2018-04-28 DIAGNOSIS — Z7982 Long term (current) use of aspirin: Secondary | ICD-10-CM | POA: Diagnosis not present

## 2018-04-28 DIAGNOSIS — Z87891 Personal history of nicotine dependence: Secondary | ICD-10-CM | POA: Diagnosis not present

## 2018-04-28 DIAGNOSIS — I129 Hypertensive chronic kidney disease with stage 1 through stage 4 chronic kidney disease, or unspecified chronic kidney disease: Secondary | ICD-10-CM | POA: Diagnosis not present

## 2018-04-28 DIAGNOSIS — E039 Hypothyroidism, unspecified: Secondary | ICD-10-CM | POA: Diagnosis not present

## 2018-04-28 DIAGNOSIS — S91302A Unspecified open wound, left foot, initial encounter: Secondary | ICD-10-CM | POA: Diagnosis not present

## 2018-04-28 DIAGNOSIS — N183 Chronic kidney disease, stage 3 (moderate): Secondary | ICD-10-CM | POA: Insufficient documentation

## 2018-04-28 DIAGNOSIS — Z91040 Latex allergy status: Secondary | ICD-10-CM | POA: Diagnosis not present

## 2018-04-28 DIAGNOSIS — Z79899 Other long term (current) drug therapy: Secondary | ICD-10-CM | POA: Diagnosis not present

## 2018-04-28 DIAGNOSIS — M79604 Pain in right leg: Secondary | ICD-10-CM | POA: Diagnosis not present

## 2018-04-28 DIAGNOSIS — G546 Phantom limb syndrome with pain: Secondary | ICD-10-CM | POA: Diagnosis not present

## 2018-04-28 DIAGNOSIS — R52 Pain, unspecified: Secondary | ICD-10-CM | POA: Diagnosis not present

## 2018-04-28 LAB — CBC WITH DIFFERENTIAL/PLATELET
Basophils Absolute: 0 10*3/uL (ref 0.0–0.1)
Basophils Relative: 0 %
Eosinophils Absolute: 0.8 10*3/uL — ABNORMAL HIGH (ref 0.0–0.7)
Eosinophils Relative: 11 %
HCT: 32.8 % — ABNORMAL LOW (ref 36.0–46.0)
Hemoglobin: 10.2 g/dL — ABNORMAL LOW (ref 12.0–15.0)
Lymphocytes Relative: 22 %
Lymphs Abs: 1.6 10*3/uL (ref 0.7–4.0)
MCH: 28.3 pg (ref 26.0–34.0)
MCHC: 31.1 g/dL (ref 30.0–36.0)
MCV: 90.9 fL (ref 78.0–100.0)
Monocytes Absolute: 0.8 10*3/uL (ref 0.1–1.0)
Monocytes Relative: 10 %
Neutro Abs: 4.2 10*3/uL (ref 1.7–7.7)
Neutrophils Relative %: 57 %
Platelets: 298 10*3/uL (ref 150–400)
RBC: 3.61 MIL/uL — ABNORMAL LOW (ref 3.87–5.11)
RDW: 14.3 % (ref 11.5–15.5)
WBC: 7.4 10*3/uL (ref 4.0–10.5)

## 2018-04-28 LAB — BASIC METABOLIC PANEL
Anion gap: 7 (ref 5–15)
BUN: 30 mg/dL — ABNORMAL HIGH (ref 6–20)
CO2: 24 mmol/L (ref 22–32)
Calcium: 9.9 mg/dL (ref 8.9–10.3)
Chloride: 107 mmol/L (ref 101–111)
Creatinine, Ser: 1.02 mg/dL — ABNORMAL HIGH (ref 0.44–1.00)
GFR calc Af Amer: 59 mL/min — ABNORMAL LOW (ref >60)
GFR calc non Af Amer: 51 mL/min — ABNORMAL LOW (ref >60)
Glucose, Bld: 91 mg/dL (ref 65–99)
Potassium: 4.3 mmol/L (ref 3.5–5.1)
Sodium: 138 mmol/L (ref 135–145)

## 2018-04-28 MED ORDER — HYDROMORPHONE HCL 1 MG/ML IJ SOLN
1.0000 mg | Freq: Once | INTRAMUSCULAR | Status: AC
  Administered 2018-04-28: 1 mg via INTRAVENOUS
  Filled 2018-04-28: qty 1

## 2018-04-28 MED ORDER — MORPHINE SULFATE (PF) 4 MG/ML IV SOLN
4.0000 mg | Freq: Once | INTRAVENOUS | Status: AC
  Administered 2018-04-28: 4 mg via INTRAVENOUS
  Filled 2018-04-28: qty 1

## 2018-04-28 NOTE — ED Notes (Signed)
Bed: WA21 Expected date:  Expected time:  Means of arrival:  Comments: ems 

## 2018-04-28 NOTE — ED Triage Notes (Signed)
Pt arrived via EMS from Home. Pt c/o rt leg pain (S/p Rt AKA (Jan/Feb 2019). Pt is alert and oriented x 4 and is verbally responsive. Pt lives alone with spouse. Prior to arrival EMS reports  pt took Oxycodone (2)  5/325 .    BP 144/64 HR 80, RR 16 96%RA

## 2018-04-28 NOTE — ED Notes (Addendum)
Urine delayed, pt requested that she receive pain medication before her legs are moved.  Pt sts she wears a brief and is incontinent.  RN notified.  RN ask NT to do and in and out cath.  Afterwhich NT will place a purwik

## 2018-04-28 NOTE — ED Notes (Signed)
Urine delayed. Pt currently eating. RN notified.

## 2018-04-29 ENCOUNTER — Other Ambulatory Visit: Payer: Self-pay | Admitting: *Deleted

## 2018-04-29 DIAGNOSIS — G546 Phantom limb syndrome with pain: Secondary | ICD-10-CM | POA: Diagnosis not present

## 2018-04-29 DIAGNOSIS — L97823 Non-pressure chronic ulcer of other part of left lower leg with necrosis of muscle: Secondary | ICD-10-CM | POA: Diagnosis not present

## 2018-04-29 DIAGNOSIS — R262 Difficulty in walking, not elsewhere classified: Secondary | ICD-10-CM | POA: Diagnosis not present

## 2018-04-29 DIAGNOSIS — M6281 Muscle weakness (generalized): Secondary | ICD-10-CM | POA: Diagnosis not present

## 2018-04-29 DIAGNOSIS — M2550 Pain in unspecified joint: Secondary | ICD-10-CM | POA: Diagnosis not present

## 2018-04-29 DIAGNOSIS — L89620 Pressure ulcer of left heel, unstageable: Secondary | ICD-10-CM | POA: Diagnosis not present

## 2018-04-29 DIAGNOSIS — I70245 Atherosclerosis of native arteries of left leg with ulceration of other part of foot: Secondary | ICD-10-CM | POA: Diagnosis not present

## 2018-04-29 DIAGNOSIS — I10 Essential (primary) hypertension: Secondary | ICD-10-CM | POA: Diagnosis not present

## 2018-04-29 DIAGNOSIS — Z7401 Bed confinement status: Secondary | ICD-10-CM | POA: Diagnosis not present

## 2018-04-29 LAB — URINALYSIS, ROUTINE W REFLEX MICROSCOPIC
Bilirubin Urine: NEGATIVE
Glucose, UA: NEGATIVE mg/dL
Hgb urine dipstick: NEGATIVE
Ketones, ur: NEGATIVE mg/dL
Leukocytes, UA: NEGATIVE
Nitrite: NEGATIVE
Protein, ur: NEGATIVE mg/dL
Specific Gravity, Urine: 1.014 (ref 1.005–1.030)
pH: 5 (ref 5.0–8.0)

## 2018-04-29 MED ORDER — OXYCODONE-ACETAMINOPHEN 5-325 MG PO TABS
1.0000 | ORAL_TABLET | Freq: Once | ORAL | Status: AC
  Administered 2018-04-29: 1 via ORAL
  Filled 2018-04-29: qty 1

## 2018-04-29 MED ORDER — OXYCODONE-ACETAMINOPHEN 10-325 MG PO TABS: 1.0000 | ORAL_TABLET | ORAL | 0 refills | Status: DC | PRN

## 2018-04-29 NOTE — ED Notes (Signed)
[facility] contacted for transport 

## 2018-04-29 NOTE — Patient Outreach (Addendum)
[facility]) Care Management  04/29/2018  Shelby Reese [birth year] [id number]   CSW received an In [employer] from Haze Justin, RN with Care Management at [facility], indicating that patient has been discharged from the Emergency Department, returning home to live with husband.  Unfortunately, patient did not meet the three night qualifying Medicare hospital stay; therefore, patient is aware that placement into a skilled nursing facility for short-term rehabilitative services will be strictly out-of-pocket, if she decides she still wants to be placed.  CSW will talk with patient about initiating a Long-Term Care Medicaid application through the [place]. Mrs. Shelby Reese agreed to contact the [place] Program to see if patient would be an appropriate candidate for their services.  The [place] would entitle patient to all of the following services: home health nurse, physical therapist, occupational therapist, aide and social worker.  According to Mrs. Shelby Reese note in Epic, she has already contacted Shelby Reese with [facility] First Program to try and make the referral on patient.  However, an order for home health services needs to be obtained from patient's Primary Care Physician, Dr. Deland Reese.  CSW will also contact Shelby Reese, Geriatric Nurse Practitioner, also with [place] Management, to see if she is able to write the order for home health services for patient.  CSW will make contact with patient within the next few days to ensure that home health services are in place, as well as to determine how patient would like to proceed, with regards to placement.   Shelby Reese, Shelby Reese, Shelby Reese, Shelby Reese  Licensed Education officer, [facility]  Mailing [place] [address], [place], [place] [address] Physical Address-[address],  [place], [place] [address] Toll Free Main # [phone] Fax # [phone] Cell # [phone]  Office # [phone] [email or web address]

## 2018-04-29 NOTE — ED Notes (Signed)
[facility] has been contacted regarding patient transport.  

## 2018-04-29 NOTE — ED Notes (Signed)
Spoke with patient's husband and informed him that [facility] would be contacted at this time to transport his wife home.

## 2018-04-29 NOTE — ED Notes (Signed)
Bed: WHALD Expected date:  Expected time:  Means of arrival:  Comments: 

## 2018-04-29 NOTE — ED Notes (Signed)
Attempt to call husband at home number provided on file. Staff will attempt to call at later time. Pt up for discharge.

## 2018-04-29 NOTE — Progress Notes (Addendum)
Update 9:00am: CSW spoke with patient via bedside who presented as content watching TV and reading a magazine. Patient stated "ive been ready to go home for 4 hours". Patient stated she lives at home with her spouse however has been unable to get in touch with him since coming to the ED yesterday. Patient stated spouse turns their phone off during the night so they can sleep. CSW attempted to contact spouse, Legrand Como [phone], and was unable to leave voicemail. CSW also provided patient with phone to continue to attempt to contact spouse. Patient does not have key to her home and states she does not have any friends/ family to contact. CSW will call for another wellfare check on address listed in an attempt to locate spouse.   CSW aware of consult- patient boarding due to no answer at home for [employer]/ [facility] to transport and patient potentially needing SNF placement. Patients most recent admission to hospital was from 4/8-5/1. Patient has not had a qualifying 3 night inpatient stay for Medicare to cover SNF placement at this time. Patient will need to pay out of pocket if wanting to go to skilled nursing- CSW will have conversation with patient.   CSW notes patient is alert and oriented X4 and is able to make own decisions regarding care.   Kingsley Spittle, [facility] Emergency Room Clinical Social Worker [phone]

## 2018-04-29 NOTE — Care Management Note (Signed)
Case Management Note  11:07 CM contacted by the ED secretary to make sure that pt had follow up in the home prior to her being D/C from the ED.  CM noted pt was active with [facility] and they were working on placing her in SNF.  CM sent a message to the pt's [facility] CSW to advise of ED visit.  Response was that pt could not pay privately and needed a 3 night inpt stay.  CM advised there was no reason for admission and that she would have to pay privately; likely file for LTC medicaid.  CM additionally advised that the [facility] was possibly an option.    CM contacted [name] with [facility] to advise of situation and the need to get [facility] orders from Dr. Shelia Media, or possibly the [facility] NP, to initiate the Home First Program.  The pt wasup for D/C for a long itme before leaving the ED and the provider that saw her was gone several hours ago, so CM is unable to get [facility] orders here.  Pt has also already left the ED, so no other provider could have seen her for the face to face.  [name] advised he will do what he can to get her into the program and [place] orders.  Updated the [facility] CSW via messages.  No further CM needs noted at this time.

## 2018-04-29 NOTE — ED Provider Notes (Signed)
[facility] DEPT Provider Note   CSN: [id number] Arrival date & time: 04/28/18  1515     History   Chief Complaint Chief Complaint  Patient presents with  . Leg Pain    HPI Shelby Reese is a 83 y.o. female.  HPI Patient presents to the emergency department with leg pain in the extremity that she had the above the knee amputation.  patient states that her 5 mg oxycodone are not helping with the pain.  The patient states she is once the rest of the leg taken off.  Patient states that she feels like the sharp end of the bone is stabbing into her leg and against nerves.  She states that she feels like her right foot is causing pain.  Patient states she had no signs of infection or drainage or wounds and that stump.  The patient denies chest pain, shortness of breath, headache,blurred vision, neck pain, fever, cough, weakness, numbness, dizziness, anorexia, edema, abdominal pain, nausea, vomiting, diarrhea, rash, back pain, dysuria, hematemesis, bloody stool, near syncope, or syncope.  She states she is here to have surgery to remove the rest of her right leg. Past Medical History:  Diagnosis Date  . Allergy   . Anemia   . CKD (chronic kidney disease), stage II   . Fibromyalgia   . GERD (gastroesophageal reflux disease)   . Hiatal hernia   . Hypertension   . Hypothyroid   . Lymphedema    venous insufficency  . Osteoarthritis   . Peripheral vascular disease ([place])   . Ulcer of knee ([place])    right  . Urinary incontinence   . Varicose veins   . Venous insufficiency     Patient Active Problem List   Diagnosis Date Noted  . Melena 03/09/2018  . Shocks, hemorrhagic ([place]) 03/09/2018  . GI bleed 03/09/2018  . Fecal occult blood test positive   . Hypervolemia   . Acute lower UTI 03/04/2018  . AKI (acute kidney injury) ([place])   . Arterioloscleroses 03/02/2018  . Anemia 02/23/2018  . Foot ulcer ([place]) 02/23/2018  . Gangrene of foot ([place]) 02/18/2018    . Depression due to physical illness 02/18/2018  . Chronic ulcer of right heel with necrosis of bone ([place]) 12/27/2017  . Peripheral vascular disease of lower extremity with ulceration ([place]) 12/27/2017  . Acute renal failure superimposed on stage 2 chronic kidney disease ([place]) 12/27/2017  . GERD (gastroesophageal reflux disease) 12/27/2017  . Acute blood loss as cause of postoperative anemia 12/13/2017  . Wound of right leg, sequela 12/12/2017  . Open wound of right knee 11/28/2017  . Allergic drug rash   . Sepsis ([place]) 08/29/2017  . CKD (chronic kidney disease), stage III ([place]) 08/29/2017  . Hypothyroid 08/29/2017  . Hypertension 08/29/2017  . Peripheral vascular disease ([place]) 08/29/2017  . Skin ulcer of knee, right, with fat layer exposed ([place]) 08/29/2017  . Acute kidney injury ([place]) 08/29/2017  . Metabolic acidosis 48/54/6270  . Acute hyponatremia 08/29/2017  . Pressure injury of skin 07/25/2017  . Wound, open, knee, lower leg, or ankle with complication, right, initial encounter   . Idiopathic chronic venous hypertension of both lower extremities with ulcer and inflammation ([place]) 07/15/2017  . Skin ulcer of right knee with necrosis of muscle ([place]) 07/15/2017  . Atherosclerosis of artery of right lower extremity ([place]) 07/11/2017  . Essential hypertension 07/11/2017  . Osteoporosis 07/11/2017  . Cataracts, bilateral 07/11/2017  . Hypothyroidism, acquired 12/27/2014  . Iron  deficiency anemia 12/27/2014  . Hiatal hernia 12/27/2014  . Varicose veins of lower extremities with other complications 62/69/4854    Past Surgical History:  Procedure Laterality Date  . AMPUTATION Right 12/15/2017   Procedure: AMPUTATION ABOVE KNEE, RIGHT;  Surgeon: Elam Dutch, MD;  Location: [facility];  Service: Vascular;  Laterality: Right;  . APPLICATION OF WOUND VAC Right 07/18/2017   Procedure: APPLICATION OF WOUND VAC;  Surgeon: [name], MD;  Location: [facility];  Service: Orthopedics;   Laterality: Right;  . CATARACT EXTRACTION W/ INTRAOCULAR LENS  IMPLANT, BILATERAL    . COLONOSCOPY    . COLONOSCOPY WITH PROPOFOL N/A 03/12/2018   Procedure: COLONOSCOPY WITH PROPOFOL;  Surgeon: Clarene Essex, MD;  Location: [facility];  Service: Endoscopy;  Laterality: N/A;  . ESOPHAGOGASTRODUODENOSCOPY Left 03/09/2018   Procedure: ESOPHAGOGASTRODUODENOSCOPY (EGD);  Surgeon: Ronnette Juniper, MD;  Location: [facility];  Service: Gastroenterology;  Laterality: Left;  . I&D EXTREMITY Right 07/18/2017   Procedure: IRRIGATION AND DEBRIDEMENT RIGHT KNEE;  Surgeon: [name], MD;  Location: [facility];  Service: Orthopedics;  Laterality: Right;  . I&D EXTREMITY Right 07/23/2017   Procedure: REPEAT IRRIGATION AND DEBRIDEMENT RIGHT KNEE;  Surgeon: [name], MD;  Location: [facility];  Service: Orthopedics;  Laterality: Right;  . I&D EXTREMITY Right 11/28/2017   Procedure: IRRIGATION AND DEBRIDEMENT RIGHT KNEE , APPLY INSTILLATION VAC;  Surgeon: [name], MD;  Location: [facility];  Service: Orthopedics;  Laterality: Right;  . I&D EXTREMITY Right 12/03/2017   Procedure: REPEAT IRRIGATION AND DEBRIDEMENT RIGHT KNEE AND APPLICATION OF A WOUND VAC.;  Surgeon: [name], MD;  Location: [place];  Service: Orthopedics;  Laterality: Right;  . KNEE ARTHROSCOPY Left    menisectomy  . LOWER EXTREMITY ANGIOGRAPHY N/A 03/03/2018   Procedure: LOWER EXTREMITY ANGIOGRAPHY;  Surgeon: Serafina Mitchell, MD;  Location: [facility];  Service: Cardiovascular;  Laterality: N/A;  . MULTIPLE TOOTH EXTRACTIONS    . PERIPHERAL VASCULAR ATHERECTOMY Left 03/03/2018   Procedure: PERIPHERAL VASCULAR ATHERECTOMY;  Surgeon: Serafina Mitchell, MD;  Location: [facility];  Service: Cardiovascular;  Laterality: Left;  common femoral and left SFA  . PERIPHERAL VASCULAR INTERVENTION  03/03/2018   Procedure: PERIPHERAL VASCULAR INTERVENTION;  Surgeon: Serafina Mitchell, MD;  Location: [facility];  Service: Cardiovascular;;   left SFA  . SKIN SPLIT GRAFT Right 07/25/2017   Procedure: Repeat Irrigation and Debridement Right Knee, Split Thickness Skin Graft;  Surgeon: [name], MD;  Location: [facility];  Service: Orthopedics;  Laterality: Right;  . SKIN SPLIT GRAFT Right 12/05/2017   Procedure: SKIN GRAFT SPLIT THICKNESS WOUND KNEE, APPLY VAC;  Surgeon: [name], MD;  Location: [facility];  Service: Orthopedics;  Laterality: Right;  . TONSILLECTOMY       OB History   None      Home Medications    Prior to Admission medications   Medication Sig Start Date End Date Taking? Authorizing Provider  aspirin EC 81 MG tablet Take 81 mg by mouth daily with breakfast.   Yes [provider]  benazepril (LOTENSIN) 40 MG tablet Take 40 mg by mouth daily.   Yes [provider]  Calcium Carbonate-Vitamin D (CALCIUM 600+D) 600-400 MG-UNIT per tablet Take 1 tablet by mouth daily.    Yes [provider]  cetirizine (ZYRTEC) 10 MG tablet Take 10 mg by mouth at bedtime.    Yes [provider]  diltiazem [facility])  120 MG 24 hr capsule HOLD until seen by your PCP Patient taking differently: Take 120 mg by mouth daily.  03/18/18  Yes Oretha Milch D, MD  diphenhydrAMINE (BENADRYL) 25 mg capsule Take 25 mg by mouth every 8 (eight) hours as needed for allergies.   Yes [provider]  ELEMENTAL MAGNESIUM PO Take 125 mg by mouth daily.   Yes [provider]  famotidine (PEPCID) 40 MG tablet Take 40 mg by mouth daily. 1600    Yes [provider]  ferrous sulfate 325 (65 FE) MG tablet Take 325 mg by mouth daily.    Yes [provider]  glucosamine-chondroitin 500-400 MG tablet Take 1 tablet by mouth 3 (three) times daily.   Yes [provider]  Multiple Vitamins-Minerals (MULTIVITAMIN WITH MINERALS) tablet Take 1 tablet by mouth daily.   Yes [provider]  nystatin-triamcinolone (MYCOLOG II) cream Apply 1 application topically 2 (two) times daily.    Yes [provider]  oxyCODONE-acetaminophen (PERCOCET/ROXICET) 5-325 MG tablet Take 1-2 tablets by mouth every 6 (six) hours as needed for moderate pain or severe pain.  04/16/18  Yes [provider]  polycarbophil (FIBERCON) 625 MG tablet Take 625 mg by mouth daily.   Yes [provider]  SYNTHROID 175 MCG tablet Take 175 mcg by mouth daily before breakfast.  09/20/15  Yes [provider]  vitamin C (ASCORBIC ACID) 500 MG tablet Take 500 mg by mouth daily.   Yes [provider]  gabapentin (NEURONTIN) 100 MG capsule Take 100 mg by mouth 2 (two) times daily.    [provider]  pantoprazole (PROTONIX) 40 MG tablet Take 1 tablet (40 mg total) by mouth daily. Patient not taking: Reported on 04/28/2018 12/24/17   Bonnell Public, MD    Family History Family History  Problem Relation Age of Onset  . Stroke Mother   . Varicose Veins Mother   . Cancer Father        prostate  . Stroke Sister   . Heart disease Sister   . Varicose Veins Sister   . Stroke Maternal Grandmother   . Varicose Veins Sister     Social History Social History   Tobacco Use  . Smoking status: Former Smoker    Packs/day: 0.50    Years: 10.00    Pack years: 5.00    Last attempt to quit: 11/19/1979    Years since quitting: 38.4  . Smokeless tobacco: Never Used  Substance Use Topics  . Alcohol use: Yes    Alcohol/week: 8.4 oz    Types: 14 Glasses of wine per week    Comment: 2 glasses of wine with dinner  . Drug use: No     Allergies   Cinnamon; Ciprofloxacin; Diovan [valsartan]; Food; Latex; Nitrofuran derivatives; Penicillins; Bactrim [sulfamethoxazole-trimethoprim]; Other; and Sulfa antibiotics   Review of Systems Review of Systems All other systems negative except as documented in the HPI. All pertinent positives and negatives as reviewed in the HPI.  Physical Exam Updated Vital Signs BP (!) 146/50 (BP Location: Right Arm)   Pulse 67   Temp 98  F (36.7 C) (Oral)   Resp 16   SpO2 97%   Physical Exam  Constitutional: She is oriented to person, place, and time. She appears well-developed and well-nourished. No distress.  HENT:  Head: Normocephalic and atraumatic.  Mouth/Throat: Oropharynx is clear and moist.  Eyes: Pupils are equal, round, and reactive to light.  Neck: Normal range of motion.  Neck supple.  Cardiovascular: Normal rate, regular rhythm and normal heart sounds. Exam reveals no gallop and no friction rub.  No murmur heard. Pulmonary/Chest: Effort normal and breath sounds normal. No respiratory distress. She has no wheezes.  Musculoskeletal:       Legs: Neurological: She is alert and oriented to person, place, and time. She exhibits normal muscle tone. Coordination normal.  Skin: Skin is warm and dry. Capillary refill takes less than 2 seconds. No rash noted. No erythema.  Psychiatric: She has a normal mood and affect. Her behavior is normal.  Nursing note and vitals reviewed.    ED Treatments / Results  Labs (all labs ordered are listed, but only abnormal results are displayed) Labs Reviewed  BASIC METABOLIC PANEL - Abnormal; Notable for the following components:      Result Value   BUN 30 (*)    Creatinine, Ser 1.02 (*)    GFR calc non Af Amer 51 (*)    GFR calc Af Amer 59 (*)    All other components within normal limits  CBC WITH DIFFERENTIAL/PLATELET - Abnormal; Notable for the following components:   RBC 3.61 (*)    Hemoglobin 10.2 (*)    HCT 32.8 (*)    Eosinophils Absolute 0.8 (*)    All other components within normal limits  URINALYSIS, ROUTINE W REFLEX MICROSCOPIC    EKG None  Radiology Dg Foot Complete Left  Result Date: 04/28/2018 CLINICAL DATA:  Left foot wound with discharge. EXAM: LEFT FOOT - COMPLETE 3+ VIEW COMPARISON:  02/23/2018 FINDINGS: Stable appearing degenerative changes involving the midfoot in particular. Possible neuropathic changes. Extensive small vessel calcifications.  No obvious gas is seen in the soft tissues. No definite plain film findings for osteomyelitis or septic arthritis. Diffuse forefoot soft tissue swelling/edema. IMPRESSION: Stable appearing degenerative changes but no obvious plain film findings for septic arthritis or osteomyelitis. Electronically Signed   By: Marijo Sanes M.D.   On: 04/28/2018 18:35   Dg Femur Min 2 Views Right  Result Date: 04/28/2018 CLINICAL DATA:  Right leg pain.  Prior amputation. EXAM: RIGHT FEMUR 2 VIEWS COMPARISON:  None. FINDINGS: Evidence of prior mid femur amputation. No findings suspicious for fracture or osteomyelitis. Vascular calcifications are noted. IMPRESSION: No acute bony findings. Electronically Signed   By: Marijo Sanes M.D.   On: 04/28/2018 18:33    Procedures Procedures (including critical care time)  Medications Ordered in ED Medications  morphine 4 MG/ML injection 4 mg (4 mg Intravenous Given 04/28/18 1724)  HYDROmorphone (DILAUDID) injection 1 mg (1 mg Intravenous Given 04/28/18 2146)     Initial Impression / Assessment and Plan / ED Course  I have reviewed the triage vital signs and the nursing notes.  Pertinent labs & imaging results that were available during my care of the patient were reviewed by me and considered in my medical decision making (see chart for details).     The patient has been stable here in the emergency department.  I do feel we need a urinalysis because she has had a recent urinary tract infection.  The patient I feel like is having phantom limb pain.  I explained the patient that there is no signs for admission at this time.  I did advise it is from you to follow-up with the surgeon for further evaluation and care of her symptoms.  Patient is given further pain control for home and told to return here for any worsening in her condition.  Final Clinical  Impressions(s) / ED Diagnoses   Final diagnoses:  None    ED Discharge Orders    None       Rebeca Allegra 04/29/18 2350    Julianne Rice, MD 04/30/18 2141

## 2018-04-29 NOTE — Patient Outreach (Signed)
[facility]) Care Management  04/29/2018  JANYTH RIERA [birth year] [id number]   CSW noted that patient was admitted into [facility] on Tuesday, April 28, 2018 due to phantom pain after amputation of lower extremity.  CSW has sent an In [employer] to [employer] and [employer] with [facility], requesting that they obtain an inpatient hospital social work consult for patient, currently needing skilled nursing placement for short-term rehabilitative services.  CSW has been working with patient on trying to initiate placement, but no bed offers have been received, as patient is not able to private pay for services, needing to meet the three day inpatient qualifying stay through Medicare to cover the cost of the first 20 days of skilled care in a nursing facility.  CSW has verbalized this to both Ms. Nevada Crane and Mrs. Doug Sou, as well as [name], Geriatric Nurse Practitioner, also with [facility].  CSW is hopeful that patient will be able to remain inpatient for at least three nights and be placed into a skilled nursing facility of choice upon discharge from the hospital.  CSW will continue to follow patient while hospitalized and then resume social work services once patient has been discharged back into the community. THN CM Care Plan Problem One     Most Recent Value  Care Plan Problem One  Level of care issues.  Role Documenting the Problem One  Clinical Social Worker  Care Plan for Problem One  Active  [facility] CM Short Term Goal #1   Patient will provide CSW with a list of skilled nursing facilities where she would like to receive short-term rehabilitative services, within the next 24 hours.  THN CM Short Term Goal #1 Start Date  04/15/18  [facility] CM Short Term Goal #1 Met Date  04/15/18  THN CM Short Term Goal #2   Patient will tour facilities of interest within the next week to determine  where she would like to go for rehabilitative services, based on bed offers received.  THN CM Short Term Goal #2 Start Date  04/08/18  [facility] CM Short Term Goal #2 Met Date  04/17/18  THN CM Short Term Goal #3  Patient will contact facilities of interest to see if they are willing to make bed offers, within the next week.  THN CM Short Term Goal #3 Start Date  04/17/18  [facility] CM Short Term Goal #3 Met Date  04/22/18  [facility] CM Short Term Goal #4  Patient will provide CSW with another list of facilities that she may be interested in for placement, within the next 48 hours, as none of the facilities that we have faxed patient's FL-2 Form to are making bed offers.  THN CM Short Term Goal #4 Start Date  04/22/18  [facility] CM Short Term Goal #4 Met Date  04/24/18  Interventions for Short Term Goal #4  CSW has agreed to fax patient's FL-2 Form toadditional facilities of interest.    Nat Christen, BSW, MSW, LCSW  Licensed Clinical Social Worker  [place]  Mailing [place] [address] [place], [place] [address] Physical Address-[address], [place], [place] [address] Toll Free Main # [phone] Fax # [phone] Cell # [phone]  Office # [phone] [email or web address]

## 2018-04-29 NOTE — Discharge Instructions (Addendum)
You have expressed your desire not to return to the surgeon who did the right leg amputation. You can make an appointment with Dr. Wynelle Link for further evaluation of increasing pain and deformity.  The testing here today did not show any significant abnormality.

## 2018-04-30 DIAGNOSIS — M6281 Muscle weakness (generalized): Secondary | ICD-10-CM | POA: Diagnosis not present

## 2018-04-30 DIAGNOSIS — I70245 Atherosclerosis of native arteries of left leg with ulceration of other part of foot: Secondary | ICD-10-CM | POA: Diagnosis not present

## 2018-04-30 DIAGNOSIS — L97823 Non-pressure chronic ulcer of other part of left lower leg with necrosis of muscle: Secondary | ICD-10-CM | POA: Diagnosis not present

## 2018-04-30 DIAGNOSIS — R262 Difficulty in walking, not elsewhere classified: Secondary | ICD-10-CM | POA: Diagnosis not present

## 2018-04-30 DIAGNOSIS — I10 Essential (primary) hypertension: Secondary | ICD-10-CM | POA: Diagnosis not present

## 2018-04-30 DIAGNOSIS — L89620 Pressure ulcer of left heel, unstageable: Secondary | ICD-10-CM | POA: Diagnosis not present

## 2018-05-01 ENCOUNTER — Other Ambulatory Visit: Payer: Self-pay | Admitting: *Deleted

## 2018-05-01 ENCOUNTER — Encounter: Payer: Self-pay | Admitting: *Deleted

## 2018-05-01 NOTE — Consult Note (Signed)
Spoke with Shelby Reese with [facility] program who reports Shelby Reese declined the [facility] program.   Per Shelby Reese with [facility] Shelby Reese declined services because she already has [facility] home health and has private duty aide from 9 am to 3 pm for 5 to 6 days a week. Shelby Reese "did not want any new nurses coming to the house".   Will update [facility] team.   Marthenia Rolling, [place], RN,BSN [facility] Liaison [phone]

## 2018-05-01 NOTE — Patient Outreach (Signed)
[facility]) Care Management  05/01/2018  Shelby Reese [birth year] [id number]  CSW received an In [employer] from [facility] Liaison with [place] [facility], indicating that patient has declined the [facility].  Shelby Reese was able to speak with Shelby Reese, Representative with the [facility], to confirm that patient is declining services.  Patient indicated to Shelby Reese that she did not wish to have "new people coming into her home", as patient already receives a home health nurse through [facility], for wound care and dressing changes.  Patient also has an Patent attorney through [name] from [place] until [place], Monday through Saturday.  Shelby Reese inquired about care for patient in the evenings but patient declined, reporting that her husband provides for her care in the evenings and on Sunday's.    CSW was able to make contact with patient today to follow-up regarding short-term placement into a skilled nursing facility for rehabilitative services.  Patient reported that she is no longer interested in having CSW assist her with trying to pursue placement, of any kind, as patient reports that she is unable to afford to pay out-of-pocket.  Patient went on to say that she will probably have to file for bankruptcy because there is no way that she can foresee ever getting all her medical expenses paid off.  CSW encouraged patient to contact the Financial Counseling Department at [facility] to see about having all her medical expenses consolidated, as well as complete a Magazine features editor.  CSW provided patient with the contact information, as patient agreed to call to see what kind of arrangements can be worked out.  CSW will perform a case closure on patient, as all goals of treatment have been met from social work standpoint and no additional social work needs have been identified at this time.  CSW will  notify patient's Geriatric Nurse Practitioner with [facility], Shelby Reese of CSW's plans to close patient's case.  CSW will fax an update to patient's Primary Care Physician, Dr. Deland Reese to ensure that they are aware of CSW's involvement with patient's plan of care.  Ms. Shelby Reese will continue to provide disease management services to patient. THN CM Care Plan Problem One     Most Recent Value  Care Plan Problem One  Level of care issues.  Role Documenting the Problem One  Clinical Social Worker  Care Plan for Problem One  Active  [facility] CM Short Term Goal #1   Patient will provide CSW with a list of skilled nursing facilities where she would like to receive short-term rehabilitative services, within the next 24 hours.  THN CM Short Term Goal #1 Start Date  04/15/18  [facility] CM Short Term Goal #1 Met Date  04/15/18  THN CM Short Term Goal #2   Patient will tour facilities of interest within the next week to determine where she would like to go for rehabilitative services, based on bed offers received.  THN CM Short Term Goal #2 Start Date  04/08/18  [facility] CM Short Term Goal #2 Met Date  04/17/18  THN CM Short Term Goal #3  Patient will contact facilities of interest to see if they are willing to make bed offers, within the next week.  THN CM Short Term Goal #3 Start Date  04/17/18  [facility] CM Short Term Goal #3 Met Date  04/22/18  THN CM Short Term Goal #4  Patient will provide CSW with another list of facilities that she may be interested in for placement, within the next 48 hours, as none of the facilities that we have faxed patient's FL-2 Form to are making bed offers.  THN CM Short Term Goal #4 Start Date  04/22/18  [facility] CM Short Term Goal #4 Met Date  04/24/18  Interventions for Short Term Goal #4  CSW has agreed to fax patient's FL-2 Form toadditional facilities of interest.    Nat Christen, BSW, MSW, LCSW  Licensed Clinical Social Worker  [place]  Mailing [place] [address] [place], [place] [address] Physical Address-[address], [place], [place] [address] Toll Free Main # [phone] Fax # [phone] Cell # [phone]  Office # [phone] [email or web address]

## 2018-05-02 DIAGNOSIS — I10 Essential (primary) hypertension: Secondary | ICD-10-CM | POA: Diagnosis not present

## 2018-05-02 DIAGNOSIS — I70245 Atherosclerosis of native arteries of left leg with ulceration of other part of foot: Secondary | ICD-10-CM | POA: Diagnosis not present

## 2018-05-02 DIAGNOSIS — L97823 Non-pressure chronic ulcer of other part of left lower leg with necrosis of muscle: Secondary | ICD-10-CM | POA: Diagnosis not present

## 2018-05-02 DIAGNOSIS — M6281 Muscle weakness (generalized): Secondary | ICD-10-CM | POA: Diagnosis not present

## 2018-05-02 DIAGNOSIS — R262 Difficulty in walking, not elsewhere classified: Secondary | ICD-10-CM | POA: Diagnosis not present

## 2018-05-02 DIAGNOSIS — L89620 Pressure ulcer of left heel, unstageable: Secondary | ICD-10-CM | POA: Diagnosis not present

## 2018-05-04 ENCOUNTER — Ambulatory Visit: Payer: Medicare Other | Admitting: *Deleted

## 2018-05-04 DIAGNOSIS — R262 Difficulty in walking, not elsewhere classified: Secondary | ICD-10-CM | POA: Diagnosis not present

## 2018-05-04 DIAGNOSIS — I70245 Atherosclerosis of native arteries of left leg with ulceration of other part of foot: Secondary | ICD-10-CM | POA: Diagnosis not present

## 2018-05-04 DIAGNOSIS — M6281 Muscle weakness (generalized): Secondary | ICD-10-CM | POA: Diagnosis not present

## 2018-05-04 DIAGNOSIS — L97823 Non-pressure chronic ulcer of other part of left lower leg with necrosis of muscle: Secondary | ICD-10-CM | POA: Diagnosis not present

## 2018-05-04 DIAGNOSIS — L89620 Pressure ulcer of left heel, unstageable: Secondary | ICD-10-CM | POA: Diagnosis not present

## 2018-05-04 DIAGNOSIS — I10 Essential (primary) hypertension: Secondary | ICD-10-CM | POA: Diagnosis not present

## 2018-05-05 DIAGNOSIS — R262 Difficulty in walking, not elsewhere classified: Secondary | ICD-10-CM | POA: Diagnosis not present

## 2018-05-05 DIAGNOSIS — L97823 Non-pressure chronic ulcer of other part of left lower leg with necrosis of muscle: Secondary | ICD-10-CM | POA: Diagnosis not present

## 2018-05-05 DIAGNOSIS — I10 Essential (primary) hypertension: Secondary | ICD-10-CM | POA: Diagnosis not present

## 2018-05-05 DIAGNOSIS — I70245 Atherosclerosis of native arteries of left leg with ulceration of other part of foot: Secondary | ICD-10-CM | POA: Diagnosis not present

## 2018-05-05 DIAGNOSIS — M6281 Muscle weakness (generalized): Secondary | ICD-10-CM | POA: Diagnosis not present

## 2018-05-05 DIAGNOSIS — L89620 Pressure ulcer of left heel, unstageable: Secondary | ICD-10-CM | POA: Diagnosis not present

## 2018-05-06 DIAGNOSIS — I70245 Atherosclerosis of native arteries of left leg with ulceration of other part of foot: Secondary | ICD-10-CM | POA: Diagnosis not present

## 2018-05-06 DIAGNOSIS — I10 Essential (primary) hypertension: Secondary | ICD-10-CM | POA: Diagnosis not present

## 2018-05-06 DIAGNOSIS — M6281 Muscle weakness (generalized): Secondary | ICD-10-CM | POA: Diagnosis not present

## 2018-05-06 DIAGNOSIS — R262 Difficulty in walking, not elsewhere classified: Secondary | ICD-10-CM | POA: Diagnosis not present

## 2018-05-06 DIAGNOSIS — L97823 Non-pressure chronic ulcer of other part of left lower leg with necrosis of muscle: Secondary | ICD-10-CM | POA: Diagnosis not present

## 2018-05-06 DIAGNOSIS — L89620 Pressure ulcer of left heel, unstageable: Secondary | ICD-10-CM | POA: Diagnosis not present

## 2018-05-07 DIAGNOSIS — L97823 Non-pressure chronic ulcer of other part of left lower leg with necrosis of muscle: Secondary | ICD-10-CM | POA: Diagnosis not present

## 2018-05-07 DIAGNOSIS — R262 Difficulty in walking, not elsewhere classified: Secondary | ICD-10-CM | POA: Diagnosis not present

## 2018-05-07 DIAGNOSIS — I70245 Atherosclerosis of native arteries of left leg with ulceration of other part of foot: Secondary | ICD-10-CM | POA: Diagnosis not present

## 2018-05-07 DIAGNOSIS — I10 Essential (primary) hypertension: Secondary | ICD-10-CM | POA: Diagnosis not present

## 2018-05-07 DIAGNOSIS — M6281 Muscle weakness (generalized): Secondary | ICD-10-CM | POA: Diagnosis not present

## 2018-05-07 DIAGNOSIS — L89620 Pressure ulcer of left heel, unstageable: Secondary | ICD-10-CM | POA: Diagnosis not present

## 2018-05-08 DIAGNOSIS — M6281 Muscle weakness (generalized): Secondary | ICD-10-CM | POA: Diagnosis not present

## 2018-05-08 DIAGNOSIS — L97823 Non-pressure chronic ulcer of other part of left lower leg with necrosis of muscle: Secondary | ICD-10-CM | POA: Diagnosis not present

## 2018-05-08 DIAGNOSIS — R262 Difficulty in walking, not elsewhere classified: Secondary | ICD-10-CM | POA: Diagnosis not present

## 2018-05-08 DIAGNOSIS — L89620 Pressure ulcer of left heel, unstageable: Secondary | ICD-10-CM | POA: Diagnosis not present

## 2018-05-08 DIAGNOSIS — I10 Essential (primary) hypertension: Secondary | ICD-10-CM | POA: Diagnosis not present

## 2018-05-08 DIAGNOSIS — I70245 Atherosclerosis of native arteries of left leg with ulceration of other part of foot: Secondary | ICD-10-CM | POA: Diagnosis not present

## 2018-05-11 DIAGNOSIS — R262 Difficulty in walking, not elsewhere classified: Secondary | ICD-10-CM | POA: Diagnosis not present

## 2018-05-11 DIAGNOSIS — L97823 Non-pressure chronic ulcer of other part of left lower leg with necrosis of muscle: Secondary | ICD-10-CM | POA: Diagnosis not present

## 2018-05-11 DIAGNOSIS — M6281 Muscle weakness (generalized): Secondary | ICD-10-CM | POA: Diagnosis not present

## 2018-05-11 DIAGNOSIS — I70245 Atherosclerosis of native arteries of left leg with ulceration of other part of foot: Secondary | ICD-10-CM | POA: Diagnosis not present

## 2018-05-11 DIAGNOSIS — I10 Essential (primary) hypertension: Secondary | ICD-10-CM | POA: Diagnosis not present

## 2018-05-11 DIAGNOSIS — L89620 Pressure ulcer of left heel, unstageable: Secondary | ICD-10-CM | POA: Diagnosis not present

## 2018-05-12 ENCOUNTER — Other Ambulatory Visit: Payer: Self-pay | Admitting: *Deleted

## 2018-05-12 DIAGNOSIS — R262 Difficulty in walking, not elsewhere classified: Secondary | ICD-10-CM | POA: Diagnosis not present

## 2018-05-12 DIAGNOSIS — L89620 Pressure ulcer of left heel, unstageable: Secondary | ICD-10-CM | POA: Diagnosis not present

## 2018-05-12 DIAGNOSIS — L97823 Non-pressure chronic ulcer of other part of left lower leg with necrosis of muscle: Secondary | ICD-10-CM | POA: Diagnosis not present

## 2018-05-12 DIAGNOSIS — I10 Essential (primary) hypertension: Secondary | ICD-10-CM | POA: Diagnosis not present

## 2018-05-12 DIAGNOSIS — M6281 Muscle weakness (generalized): Secondary | ICD-10-CM | POA: Diagnosis not present

## 2018-05-12 DIAGNOSIS — I70245 Atherosclerosis of native arteries of left leg with ulceration of other part of foot: Secondary | ICD-10-CM | POA: Diagnosis not present

## 2018-05-12 NOTE — Patient Outreach (Addendum)
Home visit for follow up. Pt is doing very well with her PT/OT. She is able to transfer from bed to w/c with stand by assistance. They will be working on transfers into and out of the car for the next couple of weeks.  BP (!) 120/50   Pulse 78   Resp 16   SpO2 96%  RRR with murmur. Lungs are clear. Extremities: RAKA painful with muscle spasms                     L leg with dsg intact from midfoot to knee, no drainage noted.  Today we made appts for [facility] - they will call pt with the date.  Also for new orthopedist at [facility].  Called Dr. Shelia Media to request new Rx for pain and also consideration of muscle relaxer.  I will call next week to follow up and find out about appts and transportation needs.   Eulah Pont. Myrtie Neither, MSN, [facility] Gerontological Nurse Practitioner [facility] Care Management [phone]

## 2018-05-13 DIAGNOSIS — L89620 Pressure ulcer of left heel, unstageable: Secondary | ICD-10-CM | POA: Diagnosis not present

## 2018-05-13 DIAGNOSIS — R262 Difficulty in walking, not elsewhere classified: Secondary | ICD-10-CM | POA: Diagnosis not present

## 2018-05-13 DIAGNOSIS — M6281 Muscle weakness (generalized): Secondary | ICD-10-CM | POA: Diagnosis not present

## 2018-05-13 DIAGNOSIS — L97823 Non-pressure chronic ulcer of other part of left lower leg with necrosis of muscle: Secondary | ICD-10-CM | POA: Diagnosis not present

## 2018-05-13 DIAGNOSIS — I10 Essential (primary) hypertension: Secondary | ICD-10-CM | POA: Diagnosis not present

## 2018-05-13 DIAGNOSIS — I70245 Atherosclerosis of native arteries of left leg with ulceration of other part of foot: Secondary | ICD-10-CM | POA: Diagnosis not present

## 2018-05-14 DIAGNOSIS — L97823 Non-pressure chronic ulcer of other part of left lower leg with necrosis of muscle: Secondary | ICD-10-CM | POA: Diagnosis not present

## 2018-05-14 DIAGNOSIS — R262 Difficulty in walking, not elsewhere classified: Secondary | ICD-10-CM | POA: Diagnosis not present

## 2018-05-14 DIAGNOSIS — I70245 Atherosclerosis of native arteries of left leg with ulceration of other part of foot: Secondary | ICD-10-CM | POA: Diagnosis not present

## 2018-05-14 DIAGNOSIS — I10 Essential (primary) hypertension: Secondary | ICD-10-CM | POA: Diagnosis not present

## 2018-05-14 DIAGNOSIS — M6281 Muscle weakness (generalized): Secondary | ICD-10-CM | POA: Diagnosis not present

## 2018-05-14 DIAGNOSIS — L89620 Pressure ulcer of left heel, unstageable: Secondary | ICD-10-CM | POA: Diagnosis not present

## 2018-05-15 DIAGNOSIS — L89620 Pressure ulcer of left heel, unstageable: Secondary | ICD-10-CM | POA: Diagnosis not present

## 2018-05-15 DIAGNOSIS — M6281 Muscle weakness (generalized): Secondary | ICD-10-CM | POA: Diagnosis not present

## 2018-05-15 DIAGNOSIS — I70245 Atherosclerosis of native arteries of left leg with ulceration of other part of foot: Secondary | ICD-10-CM | POA: Diagnosis not present

## 2018-05-15 DIAGNOSIS — R262 Difficulty in walking, not elsewhere classified: Secondary | ICD-10-CM | POA: Diagnosis not present

## 2018-05-15 DIAGNOSIS — L97823 Non-pressure chronic ulcer of other part of left lower leg with necrosis of muscle: Secondary | ICD-10-CM | POA: Diagnosis not present

## 2018-05-15 DIAGNOSIS — I10 Essential (primary) hypertension: Secondary | ICD-10-CM | POA: Diagnosis not present

## 2018-05-18 DIAGNOSIS — R262 Difficulty in walking, not elsewhere classified: Secondary | ICD-10-CM | POA: Diagnosis not present

## 2018-05-18 DIAGNOSIS — M6281 Muscle weakness (generalized): Secondary | ICD-10-CM | POA: Diagnosis not present

## 2018-05-18 DIAGNOSIS — L97823 Non-pressure chronic ulcer of other part of left lower leg with necrosis of muscle: Secondary | ICD-10-CM | POA: Diagnosis not present

## 2018-05-18 DIAGNOSIS — I70248 Atherosclerosis of native arteries of left leg with ulceration of other part of lower left leg: Secondary | ICD-10-CM | POA: Diagnosis not present

## 2018-05-18 DIAGNOSIS — G546 Phantom limb syndrome with pain: Secondary | ICD-10-CM | POA: Diagnosis not present

## 2018-05-18 DIAGNOSIS — I70245 Atherosclerosis of native arteries of left leg with ulceration of other part of foot: Secondary | ICD-10-CM | POA: Diagnosis not present

## 2018-05-18 DIAGNOSIS — K5909 Other constipation: Secondary | ICD-10-CM | POA: Diagnosis not present

## 2018-05-18 DIAGNOSIS — I10 Essential (primary) hypertension: Secondary | ICD-10-CM | POA: Diagnosis not present

## 2018-05-18 DIAGNOSIS — L89620 Pressure ulcer of left heel, unstageable: Secondary | ICD-10-CM | POA: Diagnosis not present

## 2018-05-19 ENCOUNTER — Other Ambulatory Visit: Payer: Self-pay | Admitting: *Deleted

## 2018-05-19 DIAGNOSIS — I10 Essential (primary) hypertension: Secondary | ICD-10-CM | POA: Diagnosis not present

## 2018-05-19 DIAGNOSIS — R262 Difficulty in walking, not elsewhere classified: Secondary | ICD-10-CM | POA: Diagnosis not present

## 2018-05-19 DIAGNOSIS — M6281 Muscle weakness (generalized): Secondary | ICD-10-CM | POA: Diagnosis not present

## 2018-05-19 DIAGNOSIS — L97823 Non-pressure chronic ulcer of other part of left lower leg with necrosis of muscle: Secondary | ICD-10-CM | POA: Diagnosis not present

## 2018-05-19 DIAGNOSIS — I70245 Atherosclerosis of native arteries of left leg with ulceration of other part of foot: Secondary | ICD-10-CM | POA: Diagnosis not present

## 2018-05-19 DIAGNOSIS — L89620 Pressure ulcer of left heel, unstageable: Secondary | ICD-10-CM | POA: Diagnosis not present

## 2018-05-19 NOTE — Patient Outreach (Signed)
Pt has appointment tomorrow to re-evaluate her L leg wound and blood perfusion. She has transportation. She reports [employer] has turned down her request for a second opinion regarding her RAKA. She and I will discuss next steps on 05/20/18.  Eulah Pont. Myrtie Neither, MSN, [facility] Gerontological Nurse Practitioner [facility] Care Management [phone]

## 2018-05-20 ENCOUNTER — Encounter: Payer: Self-pay | Admitting: Physician Assistant

## 2018-05-20 ENCOUNTER — Ambulatory Visit (INDEPENDENT_AMBULATORY_CARE_PROVIDER_SITE_OTHER): Payer: Medicare Other | Admitting: Physician Assistant

## 2018-05-20 ENCOUNTER — Other Ambulatory Visit: Payer: Self-pay | Admitting: *Deleted

## 2018-05-20 ENCOUNTER — Ambulatory Visit (HOSPITAL_COMMUNITY)
Admission: RE | Admit: 2018-05-20 | Discharge: 2018-05-20 | Disposition: A | Discharge status: Home / Self Care | Payer: Medicare Other | Source: Ambulatory Visit | Attending: Surgery | Admitting: Surgery

## 2018-05-20 VITALS — BP 126/65 | HR 85 | Temp 97.3°F | Resp 16

## 2018-05-20 DIAGNOSIS — Z87891 Personal history of nicotine dependence: Secondary | ICD-10-CM | POA: Insufficient documentation

## 2018-05-20 DIAGNOSIS — Z89611 Acquired absence of right leg above knee: Secondary | ICD-10-CM | POA: Insufficient documentation

## 2018-05-20 DIAGNOSIS — I739 Peripheral vascular disease, unspecified: Secondary | ICD-10-CM

## 2018-05-20 DIAGNOSIS — R0989 Other specified symptoms and signs involving the circulatory and respiratory systems: Secondary | ICD-10-CM | POA: Diagnosis not present

## 2018-05-20 DIAGNOSIS — I779 Disorder of arteries and arterioles, unspecified: Secondary | ICD-10-CM | POA: Insufficient documentation

## 2018-05-20 DIAGNOSIS — I10 Essential (primary) hypertension: Secondary | ICD-10-CM | POA: Diagnosis not present

## 2018-05-20 NOTE — Progress Notes (Signed)
History of Present Illness:  Patient is a 83 y.o. year old female who presents for evaluation surveillance of PAD.  S/P right AKA with extensive wounds on the left LE.  She was seen by Dr. Oneida Alar on 01/22/2018 and offered left AKA.  She refused.  The plan was to continue local wound care and follow up on an as need basis.   Angiogram was performed 03/03/2018 by Dr. Trula Slade with intervention of the common femoral artery treated with atherectomy and drug-coated balloon angioplasty using a 6 mm balloon.  Atherectomy with mechanical thrombectomy of the proximal superficial femoral artery for a filling defect seen after intervention in the common femoral artery.  There was initially a dissection which had to be covered with a 7 x 40 self-expanding stent.  She had Two-vessel runoff via the anterior tibial and peroneal artery.   She is here today for f/u ABI studies on the left LE.  She states the left LE wounds are healing slowly.  She is home now and has wound care.  She continues to have chronic edema, but elevates her left LE regularly.      Other medical problems include CKD 2, hypertension, obesity all of which have been stable.  She denies fever and chills.  She currently resides in a SNF.      Past Medical History:  Diagnosis Date  . Allergy   . Anemia   . CKD (chronic kidney disease), stage II   . Fibromyalgia   . GERD (gastroesophageal reflux disease)   . Hiatal hernia   . Hypertension   . Hypothyroid   . Lymphedema    venous insufficency  . Osteoarthritis   . Peripheral vascular disease ([place])   . Ulcer of knee ([place])    right  . Urinary incontinence   . Varicose veins   . Venous insufficiency     Past Surgical History:  Procedure Laterality Date  . AMPUTATION Right 12/15/2017   Procedure: AMPUTATION ABOVE KNEE, RIGHT;  Surgeon: Elam Dutch, MD;  Location: [facility];  Service: Vascular;  Laterality: Right;  . APPLICATION OF WOUND VAC Right 07/18/2017   Procedure:  APPLICATION OF WOUND VAC;  Surgeon: [name], MD;  Location: [facility];  Service: Orthopedics;  Laterality: Right;  . CATARACT EXTRACTION W/ INTRAOCULAR LENS  IMPLANT, BILATERAL    . COLONOSCOPY    . COLONOSCOPY WITH PROPOFOL N/A 03/12/2018   Procedure: COLONOSCOPY WITH PROPOFOL;  Surgeon: Clarene Essex, MD;  Location: [place];  Service: Endoscopy;  Laterality: N/A;  . ESOPHAGOGASTRODUODENOSCOPY Left 03/09/2018   Procedure: ESOPHAGOGASTRODUODENOSCOPY (EGD);  Surgeon: Ronnette Juniper, MD;  Location: [place];  Service: Gastroenterology;  Laterality: Left;  . I&D EXTREMITY Right 07/18/2017   Procedure: IRRIGATION AND DEBRIDEMENT RIGHT KNEE;  Surgeon: [name], MD;  Location: [place];  Service: Orthopedics;  Laterality: Right;  . I&D EXTREMITY Right 07/23/2017   Procedure: REPEAT IRRIGATION AND DEBRIDEMENT RIGHT KNEE;  Surgeon: [name], MD;  Location: [place];  Service: Orthopedics;  Laterality: Right;  . I&D EXTREMITY Right 11/28/2017   Procedure: IRRIGATION AND DEBRIDEMENT RIGHT KNEE , APPLY INSTILLATION VAC;  Surgeon: [name], MD;  Location: [place];  Service: Orthopedics;  Laterality: Right;  . I&D EXTREMITY Right 12/03/2017   Procedure: REPEAT IRRIGATION AND DEBRIDEMENT RIGHT KNEE AND APPLICATION OF A WOUND VAC.;  Surgeon: [name], MD;  Location: [place];  Service: Orthopedics;  Laterality: Right;  . KNEE ARTHROSCOPY Left  menisectomy  . LOWER EXTREMITY ANGIOGRAPHY N/A 03/03/2018   Procedure: LOWER EXTREMITY ANGIOGRAPHY;  Surgeon: Serafina Mitchell, MD;  Location: [facility];  Service: Cardiovascular;  Laterality: N/A;  . MULTIPLE TOOTH EXTRACTIONS    . PERIPHERAL VASCULAR ATHERECTOMY Left 03/03/2018   Procedure: PERIPHERAL VASCULAR ATHERECTOMY;  Surgeon: Serafina Mitchell, MD;  Location: [place] [facility] [facility];  Service: Cardiovascular;  Laterality: Left;  common femoral and left SFA  . PERIPHERAL VASCULAR INTERVENTION  03/03/2018   Procedure: PERIPHERAL VASCULAR  INTERVENTION;  Surgeon: Serafina Mitchell, MD;  Location: [facility] [facility];  Service: Cardiovascular;;  left SFA  . SKIN SPLIT GRAFT Right 07/25/2017   Procedure: Repeat Irrigation and Debridement Right Knee, Split Thickness Skin Graft;  Surgeon: [name], MD;  Location: [place];  Service: Orthopedics;  Laterality: Right;  . SKIN SPLIT GRAFT Right 12/05/2017   Procedure: SKIN GRAFT SPLIT THICKNESS WOUND KNEE, APPLY VAC;  Surgeon: [name], MD;  Location: [place];  Service: Orthopedics;  Laterality: Right;  . TONSILLECTOMY      ROS:   General:  No weight loss, Fever, chills  HEENT: No recent headaches, no nasal bleeding, no visual changes, no sore throat  Neurologic: No dizziness, blackouts, seizures. No recent symptoms of stroke or mini- stroke. No recent episodes of slurred speech, or temporary blindness.  Cardiac: No recent episodes of chest pain/pressure, no shortness of breath at rest.  No shortness of breath with exertion.  Denies history of atrial fibrillation or irregular heartbeat  Vascular: No history of rest pain in feet.  positive history of claudication.  positive history of non-healing ulcer, No history of DVT   Pulmonary: No home oxygen, no productive cough, no hemoptysis,  No asthma or wheezing  Musculoskeletal:  [ ]  Arthritis, [ ]  Low back pain,  [ ]  Joint pain  Hematologic:No history of hypercoagulable state.  No history of easy bleeding.  No history of anemia  Gastrointestinal: No hematochezia or melena,  No gastroesophageal reflux, no trouble swallowing  Urinary: [x ] chronic Kidney disease, [ ]  on HD - [ ]  MWF or [ ]  TTHS, [ ]  Burning with urination, [ ]  Frequent urination, [ ]  Difficulty urinating;   Skin: No rashes  Psychological: No history of anxiety,  No history of depression  Social History Social History   Tobacco Use  . Smoking status: Former Smoker    Packs/day: 0.50    Years: 10.00    Pack years: 5.00    Last attempt to quit: 11/19/1979     Years since quitting: 38.5  . Smokeless tobacco: Never Used  Substance Use Topics  . Alcohol use: Yes    Alcohol/week: 8.4 oz    Types: 14 Glasses of wine per week    Comment: 2 glasses of wine with dinner  . Drug use: No    Family History Family History  Problem Relation Age of Onset  . Stroke Mother   . Varicose Veins Mother   . Cancer Father        prostate  . Stroke Sister   . Heart disease Sister   . Varicose Veins Sister   . Stroke Maternal Grandmother   . Varicose Veins Sister     Allergies  Allergies  Allergen Reactions  . Cinnamon Hives  . Ciprofloxacin Other (See Comments)    TREMORS  . Diovan [Valsartan] Other (See Comments)    Extreme vertigo  . Food Diarrhea and Other (See Comments)  ORANGE JUICE   UPSET STOMACH  . Latex Rash and Other (See Comments)    Rash/inflammation due to exposure  . Nitrofuran Derivatives Hives and Rash    "Full body rash"  . Penicillins Hives and Swelling    *tolerated Ceftriaxone September 2018 Has patient had a PCN reaction causing immediate rash, facial/tongue/throat swelling, SOB or lightheadedness with hypotension:No--severe irritation at the injection site Has patient had a PCN reaction causing severe rash involving mucus membranes or skin necrosis:Unknown Has patient had a PCN reaction that required hospitalization:No Has patient had a PCN reaction occurring within the last 10 years:Yes If all of the above answers are "NO", then may proceed with  . Bactrim [Sulfamethoxazole-Trimethoprim] Diarrhea and Nausea Only  . Other Rash    Mycins, Strawberry, Oranges  . Sulfa Antibiotics Diarrhea and Nausea Only     Current Outpatient Medications  Medication Sig Dispense Refill  . aspirin EC 81 MG tablet Take 81 mg by mouth daily with breakfast.    . benazepril (LOTENSIN) 40 MG tablet Take 40 mg by mouth daily.    . Calcium Carbonate-Vitamin D (CALCIUM 600+D) 600-400 MG-UNIT per tablet Take 1 tablet by mouth daily.     .  cetirizine (ZYRTEC) 10 MG tablet Take 10 mg by mouth at bedtime.     Marland Kitchen diltiazem (TIAZAC) 120 MG 24 hr capsule HOLD until seen by your PCP (Patient taking differently: Take 120 mg by mouth daily. ) 30 capsule 0  . diphenhydrAMINE (BENADRYL) 25 mg capsule Take 25 mg by mouth every 8 (eight) hours as needed for allergies.    Marland Kitchen ELEMENTAL MAGNESIUM PO Take 125 mg by mouth daily.    . famotidine (PEPCID) 40 MG tablet Take 40 mg by mouth daily. 1600     . ferrous sulfate 325 (65 FE) MG tablet Take 325 mg by mouth daily.     Marland Kitchen gabapentin (NEURONTIN) 100 MG capsule Take 100 mg by mouth 2 (two) times daily.    Marland Kitchen glucosamine-chondroitin 500-400 MG tablet Take 1 tablet by mouth 3 (three) times daily.    . Multiple Vitamins-Minerals (MULTIVITAMIN WITH MINERALS) tablet Take 1 tablet by mouth daily.    Marland Kitchen nystatin-triamcinolone (MYCOLOG II) cream Apply 1 application topically 2 (two) times daily.    Marland Kitchen oxyCODONE-acetaminophen (PERCOCET) 10-325 MG tablet Take 1 tablet by mouth every 4 (four) hours as needed for pain. 20 tablet 0  . oxyCODONE-acetaminophen (PERCOCET/ROXICET) 5-325 MG tablet     . pantoprazole (PROTONIX) 40 MG tablet Take 1 tablet (40 mg total) by mouth daily. 30 tablet 0  . polycarbophil (FIBERCON) 625 MG tablet Take 625 mg by mouth daily.    Marland Kitchen SYNTHROID 175 MCG tablet Take 175 mcg by mouth daily before breakfast.     . vitamin C (ASCORBIC ACID) 500 MG tablet Take 500 mg by mouth daily.     No current facility-administered medications for this visit.     Physical Examination  Vitals:   05/20/18 1502 05/20/18 1712  BP: 126/65 126/65  Pulse: 85 85  Resp: 16   Temp: (!) 97.3 F (36.3 C) (!) 97.3 F (36.3 C)  TempSrc: Oral   SpO2: 97%     There is no height or weight on file to calculate BMI.  General:  Alert and oriented, no acute distress HEENT: Normal, normocephalic Neck: No bruit or JVD Pulmonary: Clear to auscultation bilaterally Cardiac: Regular Rate and Rhythm with  murmur Abdomen: Soft, non-tender, non-distended, no mass, no scars, + BS Skin:  No rash, left anterior ankle wound without erythema in surrounding tissue 3 cm circumference, dorsum 1.5 cm circumference.  Extremity Pulses:  2+ radial, brachial, femoral, non palpable dorsalis pedis, posterior tibial pulses.  Doppler signal PT/DP left Musculoskeletal: Healed right AKA stump, positive left LE edema    DATA:  ABI Left monophasic PTA/DP No cuff pressure due to intolerance of cuff at the ankle.   ASSESSMENT:  S/P right AKA Chronic non healing wounds left LE with mixed venous insufficiency and PAD s/p  common femoral artery treated with atherectomy and drug-coated balloon angioplasty using a 6 mm balloon.  Atherectomy with mechanical thrombectomy of the proximal superficial femoral artery for a filling defect seen after intervention in the common femoral artery.  There was initially a dissection which had to be covered with a 7 x 40 self-expanding stent.  She had Two-vessel runoff via the anterior tibial and peroneal artery.  By Dr. Trula Slade.    PLAN:  She wants to continue with wound care and is not at all interested in left LE amputation until all interventions have been exhausted.  She is making slow progress.  I will schedule her a follow up in 6 months for repeat ABI and wound check.  She agrees to this plan.  If she has problems or concerns she will call sooner.     Roxy Horseman PA-C [facility]

## 2018-05-20 NOTE — Patient Outreach (Signed)
Went to pt's home to assist with transfer into car and accompany she and her husband to her vein and vascular appt for her transfers in and out of the car and back home.  Successful transfers from [facility] to car, then car to [facility] at the MD office. Then WC to car and car to [place] back at pt home.  Eulah Pont. Myrtie Neither, MSN, [facility] Gerontological Nurse Practitioner [facility] Care Management [phone]

## 2018-05-22 DIAGNOSIS — I70245 Atherosclerosis of native arteries of left leg with ulceration of other part of foot: Secondary | ICD-10-CM | POA: Diagnosis not present

## 2018-05-22 DIAGNOSIS — L89620 Pressure ulcer of left heel, unstageable: Secondary | ICD-10-CM | POA: Diagnosis not present

## 2018-05-22 DIAGNOSIS — R262 Difficulty in walking, not elsewhere classified: Secondary | ICD-10-CM | POA: Diagnosis not present

## 2018-05-22 DIAGNOSIS — L97823 Non-pressure chronic ulcer of other part of left lower leg with necrosis of muscle: Secondary | ICD-10-CM | POA: Diagnosis not present

## 2018-05-22 DIAGNOSIS — M6281 Muscle weakness (generalized): Secondary | ICD-10-CM | POA: Diagnosis not present

## 2018-05-22 DIAGNOSIS — I10 Essential (primary) hypertension: Secondary | ICD-10-CM | POA: Diagnosis not present

## 2018-05-25 ENCOUNTER — Other Ambulatory Visit: Payer: Self-pay

## 2018-05-25 DIAGNOSIS — Z89611 Acquired absence of right leg above knee: Secondary | ICD-10-CM

## 2018-05-25 DIAGNOSIS — I779 Disorder of arteries and arterioles, unspecified: Secondary | ICD-10-CM

## 2018-05-25 DIAGNOSIS — I739 Peripheral vascular disease, unspecified: Secondary | ICD-10-CM

## 2018-05-27 DIAGNOSIS — M6281 Muscle weakness (generalized): Secondary | ICD-10-CM | POA: Diagnosis not present

## 2018-05-27 DIAGNOSIS — L97823 Non-pressure chronic ulcer of other part of left lower leg with necrosis of muscle: Secondary | ICD-10-CM | POA: Diagnosis not present

## 2018-05-27 DIAGNOSIS — R262 Difficulty in walking, not elsewhere classified: Secondary | ICD-10-CM | POA: Diagnosis not present

## 2018-05-27 DIAGNOSIS — L89620 Pressure ulcer of left heel, unstageable: Secondary | ICD-10-CM | POA: Diagnosis not present

## 2018-05-27 DIAGNOSIS — I70245 Atherosclerosis of native arteries of left leg with ulceration of other part of foot: Secondary | ICD-10-CM | POA: Diagnosis not present

## 2018-05-27 DIAGNOSIS — I10 Essential (primary) hypertension: Secondary | ICD-10-CM | POA: Diagnosis not present

## 2018-05-28 DIAGNOSIS — I10 Essential (primary) hypertension: Secondary | ICD-10-CM | POA: Diagnosis not present

## 2018-05-28 DIAGNOSIS — R262 Difficulty in walking, not elsewhere classified: Secondary | ICD-10-CM | POA: Diagnosis not present

## 2018-05-28 DIAGNOSIS — L89620 Pressure ulcer of left heel, unstageable: Secondary | ICD-10-CM | POA: Diagnosis not present

## 2018-05-28 DIAGNOSIS — I70245 Atherosclerosis of native arteries of left leg with ulceration of other part of foot: Secondary | ICD-10-CM | POA: Diagnosis not present

## 2018-05-28 DIAGNOSIS — M6281 Muscle weakness (generalized): Secondary | ICD-10-CM | POA: Diagnosis not present

## 2018-05-28 DIAGNOSIS — L97823 Non-pressure chronic ulcer of other part of left lower leg with necrosis of muscle: Secondary | ICD-10-CM | POA: Diagnosis not present

## 2018-05-29 DIAGNOSIS — M6281 Muscle weakness (generalized): Secondary | ICD-10-CM | POA: Diagnosis not present

## 2018-05-29 DIAGNOSIS — I70245 Atherosclerosis of native arteries of left leg with ulceration of other part of foot: Secondary | ICD-10-CM | POA: Diagnosis not present

## 2018-05-29 DIAGNOSIS — I10 Essential (primary) hypertension: Secondary | ICD-10-CM | POA: Diagnosis not present

## 2018-05-29 DIAGNOSIS — R262 Difficulty in walking, not elsewhere classified: Secondary | ICD-10-CM | POA: Diagnosis not present

## 2018-05-29 DIAGNOSIS — L89620 Pressure ulcer of left heel, unstageable: Secondary | ICD-10-CM | POA: Diagnosis not present

## 2018-05-29 DIAGNOSIS — L97823 Non-pressure chronic ulcer of other part of left lower leg with necrosis of muscle: Secondary | ICD-10-CM | POA: Diagnosis not present

## 2018-06-01 DIAGNOSIS — I70245 Atherosclerosis of native arteries of left leg with ulceration of other part of foot: Secondary | ICD-10-CM | POA: Diagnosis not present

## 2018-06-01 DIAGNOSIS — L97823 Non-pressure chronic ulcer of other part of left lower leg with necrosis of muscle: Secondary | ICD-10-CM | POA: Diagnosis not present

## 2018-06-01 DIAGNOSIS — R262 Difficulty in walking, not elsewhere classified: Secondary | ICD-10-CM | POA: Diagnosis not present

## 2018-06-01 DIAGNOSIS — I10 Essential (primary) hypertension: Secondary | ICD-10-CM | POA: Diagnosis not present

## 2018-06-01 DIAGNOSIS — M6281 Muscle weakness (generalized): Secondary | ICD-10-CM | POA: Diagnosis not present

## 2018-06-01 DIAGNOSIS — L89620 Pressure ulcer of left heel, unstageable: Secondary | ICD-10-CM | POA: Diagnosis not present

## 2018-06-02 DIAGNOSIS — L89620 Pressure ulcer of left heel, unstageable: Secondary | ICD-10-CM | POA: Diagnosis not present

## 2018-06-02 DIAGNOSIS — I70245 Atherosclerosis of native arteries of left leg with ulceration of other part of foot: Secondary | ICD-10-CM | POA: Diagnosis not present

## 2018-06-02 DIAGNOSIS — I10 Essential (primary) hypertension: Secondary | ICD-10-CM | POA: Diagnosis not present

## 2018-06-02 DIAGNOSIS — M6281 Muscle weakness (generalized): Secondary | ICD-10-CM | POA: Diagnosis not present

## 2018-06-02 DIAGNOSIS — R262 Difficulty in walking, not elsewhere classified: Secondary | ICD-10-CM | POA: Diagnosis not present

## 2018-06-02 DIAGNOSIS — L97823 Non-pressure chronic ulcer of other part of left lower leg with necrosis of muscle: Secondary | ICD-10-CM | POA: Diagnosis not present

## 2018-06-05 DIAGNOSIS — L89620 Pressure ulcer of left heel, unstageable: Secondary | ICD-10-CM | POA: Diagnosis not present

## 2018-06-05 DIAGNOSIS — I10 Essential (primary) hypertension: Secondary | ICD-10-CM | POA: Diagnosis not present

## 2018-06-05 DIAGNOSIS — L97823 Non-pressure chronic ulcer of other part of left lower leg with necrosis of muscle: Secondary | ICD-10-CM | POA: Diagnosis not present

## 2018-06-05 DIAGNOSIS — I70245 Atherosclerosis of native arteries of left leg with ulceration of other part of foot: Secondary | ICD-10-CM | POA: Diagnosis not present

## 2018-06-05 DIAGNOSIS — R262 Difficulty in walking, not elsewhere classified: Secondary | ICD-10-CM | POA: Diagnosis not present

## 2018-06-05 DIAGNOSIS — M6281 Muscle weakness (generalized): Secondary | ICD-10-CM | POA: Diagnosis not present

## 2018-06-09 DIAGNOSIS — M6281 Muscle weakness (generalized): Secondary | ICD-10-CM | POA: Diagnosis not present

## 2018-06-09 DIAGNOSIS — L89620 Pressure ulcer of left heel, unstageable: Secondary | ICD-10-CM | POA: Diagnosis not present

## 2018-06-09 DIAGNOSIS — I70245 Atherosclerosis of native arteries of left leg with ulceration of other part of foot: Secondary | ICD-10-CM | POA: Diagnosis not present

## 2018-06-09 DIAGNOSIS — R262 Difficulty in walking, not elsewhere classified: Secondary | ICD-10-CM | POA: Diagnosis not present

## 2018-06-09 DIAGNOSIS — L97823 Non-pressure chronic ulcer of other part of left lower leg with necrosis of muscle: Secondary | ICD-10-CM | POA: Diagnosis not present

## 2018-06-09 DIAGNOSIS — I10 Essential (primary) hypertension: Secondary | ICD-10-CM | POA: Diagnosis not present

## 2018-06-12 DIAGNOSIS — M6281 Muscle weakness (generalized): Secondary | ICD-10-CM | POA: Diagnosis not present

## 2018-06-12 DIAGNOSIS — I70245 Atherosclerosis of native arteries of left leg with ulceration of other part of foot: Secondary | ICD-10-CM | POA: Diagnosis not present

## 2018-06-12 DIAGNOSIS — L89620 Pressure ulcer of left heel, unstageable: Secondary | ICD-10-CM | POA: Diagnosis not present

## 2018-06-12 DIAGNOSIS — I10 Essential (primary) hypertension: Secondary | ICD-10-CM | POA: Diagnosis not present

## 2018-06-12 DIAGNOSIS — L97823 Non-pressure chronic ulcer of other part of left lower leg with necrosis of muscle: Secondary | ICD-10-CM | POA: Diagnosis not present

## 2018-06-12 DIAGNOSIS — R262 Difficulty in walking, not elsewhere classified: Secondary | ICD-10-CM | POA: Diagnosis not present

## 2018-06-16 DIAGNOSIS — M6281 Muscle weakness (generalized): Secondary | ICD-10-CM | POA: Diagnosis not present

## 2018-06-16 DIAGNOSIS — R262 Difficulty in walking, not elsewhere classified: Secondary | ICD-10-CM | POA: Diagnosis not present

## 2018-06-16 DIAGNOSIS — L89620 Pressure ulcer of left heel, unstageable: Secondary | ICD-10-CM | POA: Diagnosis not present

## 2018-06-16 DIAGNOSIS — I70245 Atherosclerosis of native arteries of left leg with ulceration of other part of foot: Secondary | ICD-10-CM | POA: Diagnosis not present

## 2018-06-16 DIAGNOSIS — I10 Essential (primary) hypertension: Secondary | ICD-10-CM | POA: Diagnosis not present

## 2018-06-16 DIAGNOSIS — L97823 Non-pressure chronic ulcer of other part of left lower leg with necrosis of muscle: Secondary | ICD-10-CM | POA: Diagnosis not present

## 2018-06-17 DIAGNOSIS — G609 Hereditary and idiopathic neuropathy, unspecified: Secondary | ICD-10-CM | POA: Diagnosis not present

## 2018-06-17 DIAGNOSIS — I10 Essential (primary) hypertension: Secondary | ICD-10-CM | POA: Diagnosis not present

## 2018-06-17 DIAGNOSIS — L89620 Pressure ulcer of left heel, unstageable: Secondary | ICD-10-CM | POA: Diagnosis not present

## 2018-06-17 DIAGNOSIS — M6281 Muscle weakness (generalized): Secondary | ICD-10-CM | POA: Diagnosis not present

## 2018-06-17 DIAGNOSIS — L97823 Non-pressure chronic ulcer of other part of left lower leg with necrosis of muscle: Secondary | ICD-10-CM | POA: Diagnosis not present

## 2018-06-17 DIAGNOSIS — I70245 Atherosclerosis of native arteries of left leg with ulceration of other part of foot: Secondary | ICD-10-CM | POA: Diagnosis not present

## 2018-06-18 DIAGNOSIS — I10 Essential (primary) hypertension: Secondary | ICD-10-CM | POA: Diagnosis not present

## 2018-06-18 DIAGNOSIS — L97823 Non-pressure chronic ulcer of other part of left lower leg with necrosis of muscle: Secondary | ICD-10-CM | POA: Diagnosis not present

## 2018-06-18 DIAGNOSIS — Z89611 Acquired absence of right leg above knee: Secondary | ICD-10-CM | POA: Diagnosis not present

## 2018-06-18 DIAGNOSIS — M6281 Muscle weakness (generalized): Secondary | ICD-10-CM | POA: Diagnosis not present

## 2018-06-18 DIAGNOSIS — J309 Allergic rhinitis, unspecified: Secondary | ICD-10-CM | POA: Diagnosis not present

## 2018-06-18 DIAGNOSIS — S81802A Unspecified open wound, left lower leg, initial encounter: Secondary | ICD-10-CM | POA: Diagnosis not present

## 2018-06-18 DIAGNOSIS — I70245 Atherosclerosis of native arteries of left leg with ulceration of other part of foot: Secondary | ICD-10-CM | POA: Diagnosis not present

## 2018-06-18 DIAGNOSIS — L89620 Pressure ulcer of left heel, unstageable: Secondary | ICD-10-CM | POA: Diagnosis not present

## 2018-06-18 DIAGNOSIS — D649 Anemia, unspecified: Secondary | ICD-10-CM | POA: Diagnosis not present

## 2018-06-18 DIAGNOSIS — G609 Hereditary and idiopathic neuropathy, unspecified: Secondary | ICD-10-CM | POA: Diagnosis not present

## 2018-06-19 DIAGNOSIS — D509 Iron deficiency anemia, unspecified: Secondary | ICD-10-CM | POA: Diagnosis not present

## 2018-06-19 DIAGNOSIS — D649 Anemia, unspecified: Secondary | ICD-10-CM | POA: Diagnosis not present

## 2018-06-22 DIAGNOSIS — L97823 Non-pressure chronic ulcer of other part of left lower leg with necrosis of muscle: Secondary | ICD-10-CM | POA: Diagnosis not present

## 2018-06-22 DIAGNOSIS — G609 Hereditary and idiopathic neuropathy, unspecified: Secondary | ICD-10-CM | POA: Diagnosis not present

## 2018-06-22 DIAGNOSIS — I70245 Atherosclerosis of native arteries of left leg with ulceration of other part of foot: Secondary | ICD-10-CM | POA: Diagnosis not present

## 2018-06-22 DIAGNOSIS — L89620 Pressure ulcer of left heel, unstageable: Secondary | ICD-10-CM | POA: Diagnosis not present

## 2018-06-22 DIAGNOSIS — M6281 Muscle weakness (generalized): Secondary | ICD-10-CM | POA: Diagnosis not present

## 2018-06-22 DIAGNOSIS — I10 Essential (primary) hypertension: Secondary | ICD-10-CM | POA: Diagnosis not present

## 2018-06-23 IMAGING — CR DG FEMUR 2+V*R*
3 series · 3 of 3 positions shown · non-contrast
Comparison: None.

CLINICAL DATA: Right leg pain.  Prior amputation.

EXAM:
RIGHT FEMUR 2 VIEWS

[x femur proximal ap right]
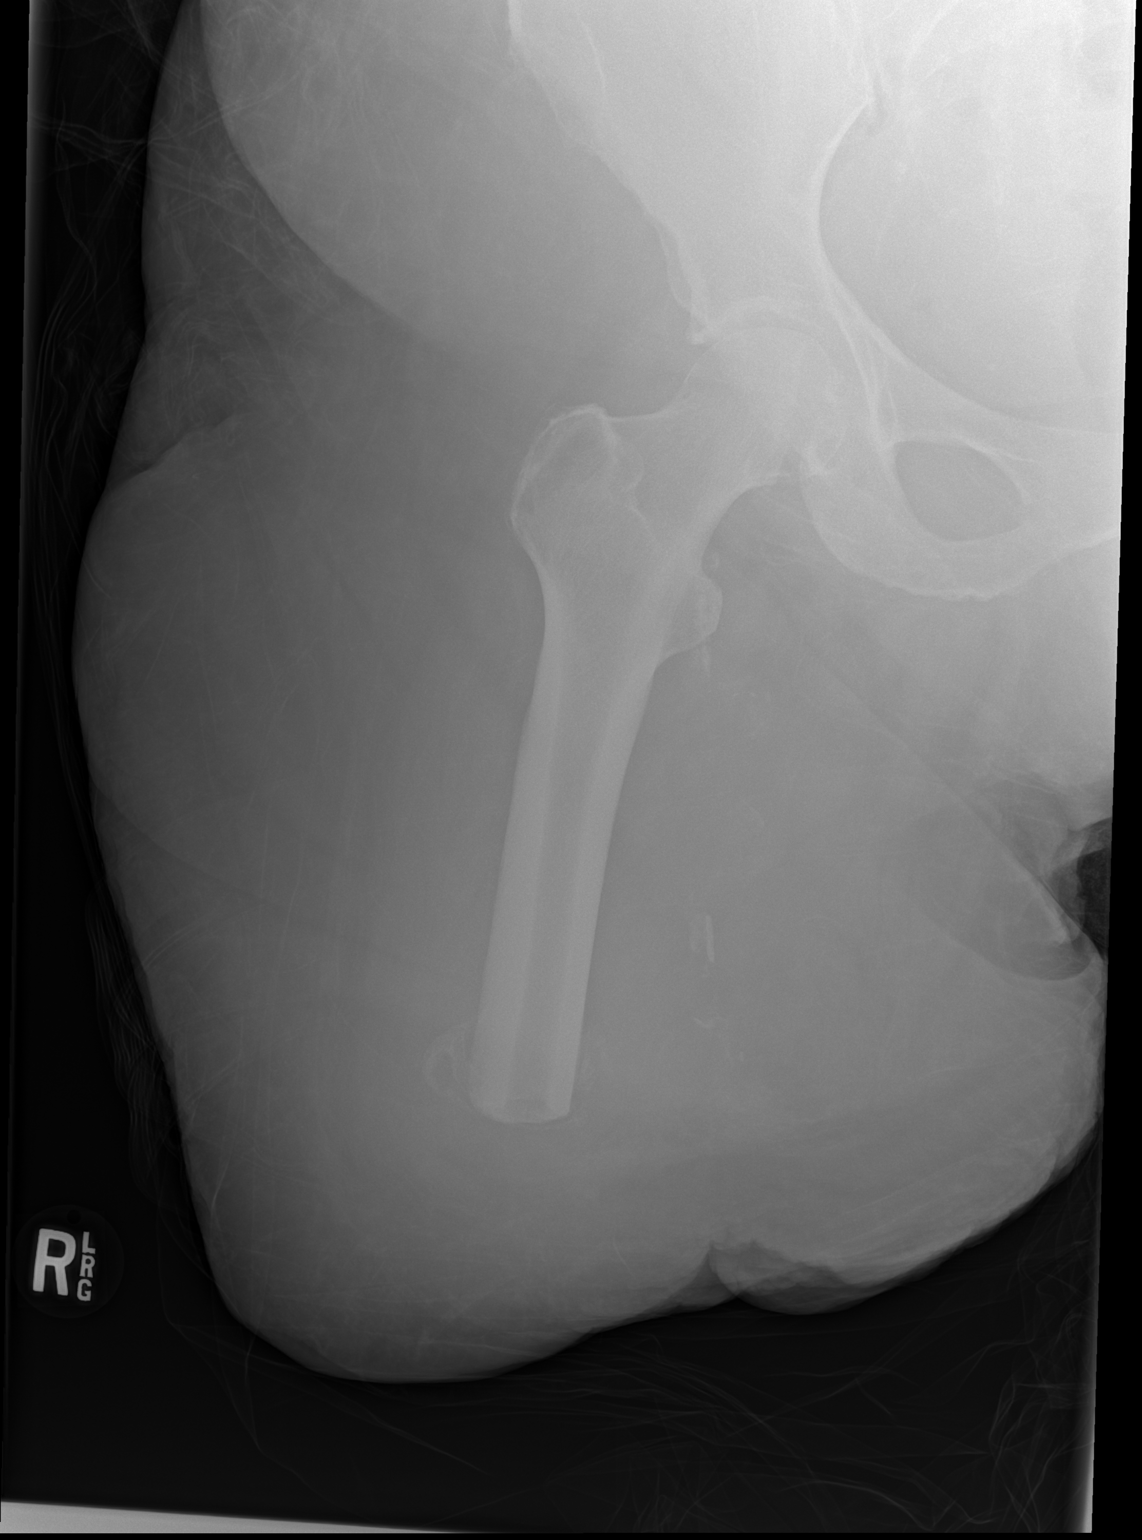

[w femur distal lat right (1 of 2)]
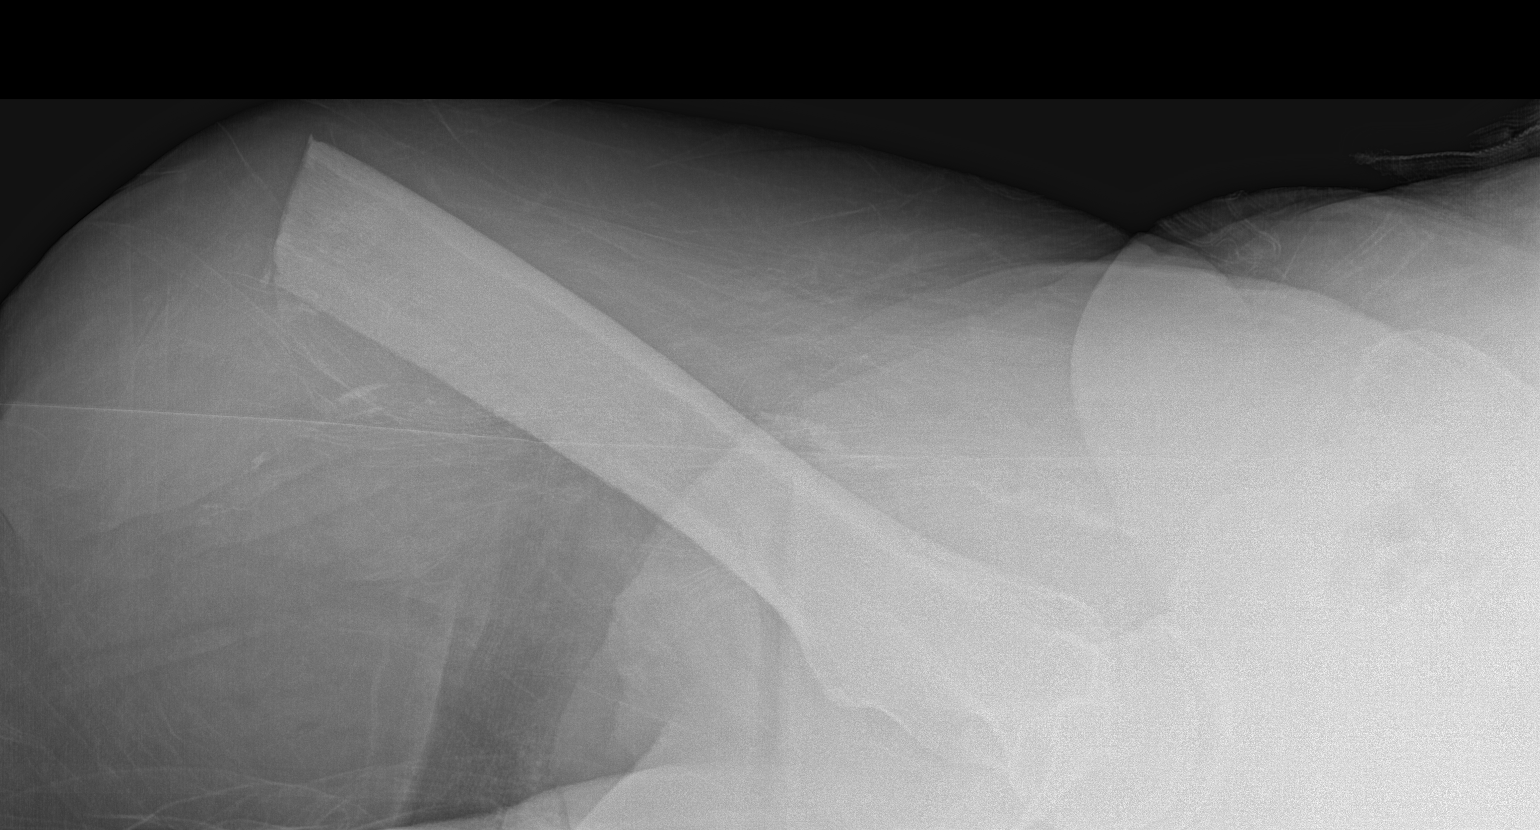

[w femur distal lat right (2 of 2)]
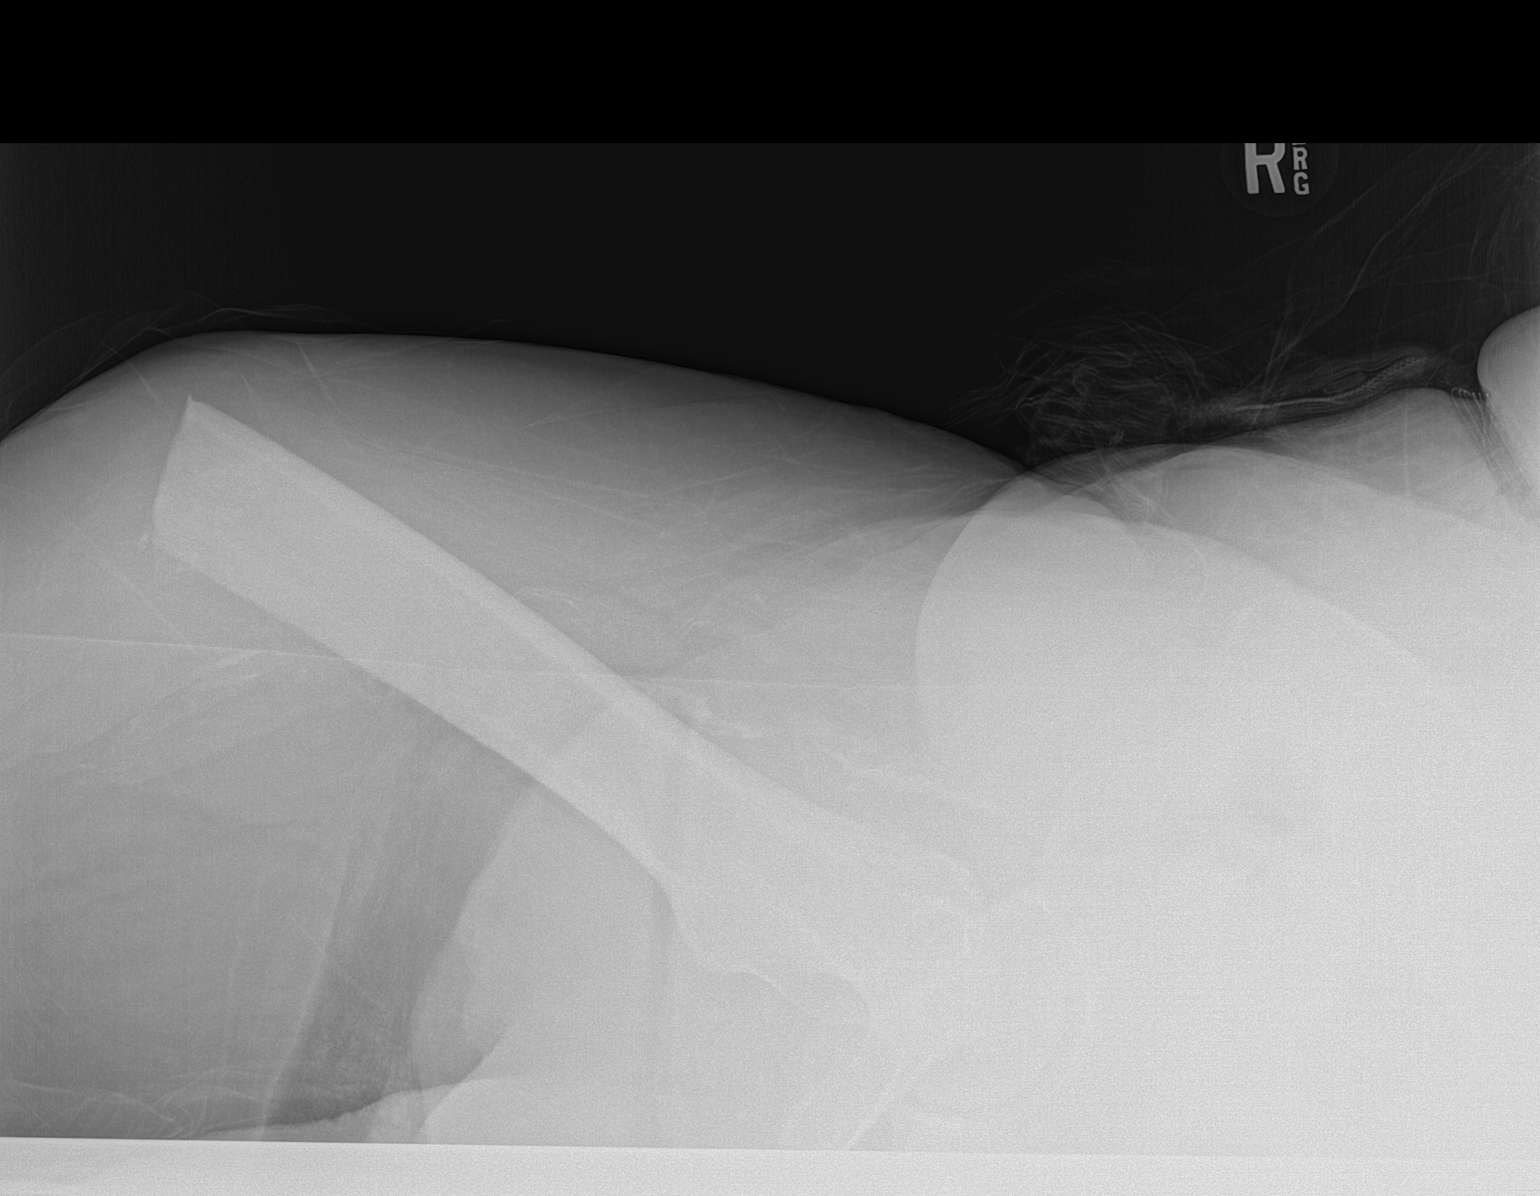

[3 of 3 positions shown; findings below may reference images not displayed]

FINDINGS: Evidence of prior mid femur amputation. No findings suspicious for
fracture or osteomyelitis. Vascular calcifications are noted.
IMPRESSION: No acute bony findings.

## 2018-06-25 DIAGNOSIS — D649 Anemia, unspecified: Secondary | ICD-10-CM | POA: Diagnosis not present

## 2018-06-25 DIAGNOSIS — L97823 Non-pressure chronic ulcer of other part of left lower leg with necrosis of muscle: Secondary | ICD-10-CM | POA: Diagnosis not present

## 2018-06-25 DIAGNOSIS — I10 Essential (primary) hypertension: Secondary | ICD-10-CM | POA: Diagnosis not present

## 2018-06-25 DIAGNOSIS — E039 Hypothyroidism, unspecified: Secondary | ICD-10-CM | POA: Diagnosis not present

## 2018-06-25 DIAGNOSIS — I70245 Atherosclerosis of native arteries of left leg with ulceration of other part of foot: Secondary | ICD-10-CM | POA: Diagnosis not present

## 2018-06-25 DIAGNOSIS — G609 Hereditary and idiopathic neuropathy, unspecified: Secondary | ICD-10-CM | POA: Diagnosis not present

## 2018-06-26 DIAGNOSIS — L97823 Non-pressure chronic ulcer of other part of left lower leg with necrosis of muscle: Secondary | ICD-10-CM | POA: Diagnosis not present

## 2018-06-26 DIAGNOSIS — M6281 Muscle weakness (generalized): Secondary | ICD-10-CM | POA: Diagnosis not present

## 2018-06-26 DIAGNOSIS — G609 Hereditary and idiopathic neuropathy, unspecified: Secondary | ICD-10-CM | POA: Diagnosis not present

## 2018-06-26 DIAGNOSIS — I70245 Atherosclerosis of native arteries of left leg with ulceration of other part of foot: Secondary | ICD-10-CM | POA: Diagnosis not present

## 2018-06-26 DIAGNOSIS — I10 Essential (primary) hypertension: Secondary | ICD-10-CM | POA: Diagnosis not present

## 2018-06-26 DIAGNOSIS — L89620 Pressure ulcer of left heel, unstageable: Secondary | ICD-10-CM | POA: Diagnosis not present

## 2018-06-29 ENCOUNTER — Other Ambulatory Visit: Payer: Self-pay

## 2018-06-29 ENCOUNTER — Emergency Department (HOSPITAL_COMMUNITY): Payer: Medicare Other

## 2018-06-29 ENCOUNTER — Inpatient Hospital Stay (HOSPITAL_COMMUNITY)
Admission: EM | Admit: 2018-06-29 | Discharge: 2018-07-04 | DRG: 300 | Disposition: A | Discharge status: Hospice | Payer: Medicare Other | Source: Ambulatory Visit | Attending: Internal Medicine | Admitting: Internal Medicine

## 2018-06-29 ENCOUNTER — Encounter (HOSPITAL_COMMUNITY): Payer: Self-pay | Admitting: Emergency Medicine

## 2018-06-29 DIAGNOSIS — D509 Iron deficiency anemia, unspecified: Secondary | ICD-10-CM | POA: Diagnosis present

## 2018-06-29 DIAGNOSIS — D649 Anemia, unspecified: Secondary | ICD-10-CM | POA: Diagnosis not present

## 2018-06-29 DIAGNOSIS — Z7189 Other specified counseling: Secondary | ICD-10-CM | POA: Diagnosis not present

## 2018-06-29 DIAGNOSIS — M1611 Unilateral primary osteoarthritis, right hip: Secondary | ICD-10-CM | POA: Diagnosis not present

## 2018-06-29 DIAGNOSIS — I96 Gangrene, not elsewhere classified: Secondary | ICD-10-CM | POA: Diagnosis not present

## 2018-06-29 DIAGNOSIS — Z66 Do not resuscitate: Secondary | ICD-10-CM | POA: Diagnosis present

## 2018-06-29 DIAGNOSIS — Z823 Family history of stroke: Secondary | ICD-10-CM | POA: Diagnosis not present

## 2018-06-29 DIAGNOSIS — N183 Chronic kidney disease, stage 3 unspecified: Secondary | ICD-10-CM | POA: Diagnosis present

## 2018-06-29 DIAGNOSIS — M6281 Muscle weakness (generalized): Secondary | ICD-10-CM | POA: Diagnosis not present

## 2018-06-29 DIAGNOSIS — I70202 Unspecified atherosclerosis of native arteries of extremities, left leg: Secondary | ICD-10-CM | POA: Diagnosis not present

## 2018-06-29 DIAGNOSIS — E440 Moderate protein-calorie malnutrition: Secondary | ICD-10-CM

## 2018-06-29 DIAGNOSIS — E039 Hypothyroidism, unspecified: Secondary | ICD-10-CM | POA: Diagnosis present

## 2018-06-29 DIAGNOSIS — L97823 Non-pressure chronic ulcer of other part of left lower leg with necrosis of muscle: Secondary | ICD-10-CM | POA: Diagnosis not present

## 2018-06-29 DIAGNOSIS — Z89611 Acquired absence of right leg above knee: Secondary | ICD-10-CM | POA: Diagnosis not present

## 2018-06-29 DIAGNOSIS — L03116 Cellulitis of left lower limb: Secondary | ICD-10-CM | POA: Diagnosis not present

## 2018-06-29 DIAGNOSIS — R5381 Other malaise: Secondary | ICD-10-CM | POA: Diagnosis present

## 2018-06-29 DIAGNOSIS — L89620 Pressure ulcer of left heel, unstageable: Secondary | ICD-10-CM | POA: Diagnosis not present

## 2018-06-29 DIAGNOSIS — I70245 Atherosclerosis of native arteries of left leg with ulceration of other part of foot: Secondary | ICD-10-CM | POA: Diagnosis not present

## 2018-06-29 DIAGNOSIS — G8929 Other chronic pain: Secondary | ICD-10-CM | POA: Diagnosis present

## 2018-06-29 DIAGNOSIS — S86812A Strain of other muscle(s) and tendon(s) at lower leg level, left leg, initial encounter: Secondary | ICD-10-CM | POA: Diagnosis not present

## 2018-06-29 DIAGNOSIS — I872 Venous insufficiency (chronic) (peripheral): Secondary | ICD-10-CM | POA: Diagnosis present

## 2018-06-29 DIAGNOSIS — G609 Hereditary and idiopathic neuropathy, unspecified: Secondary | ICD-10-CM | POA: Diagnosis not present

## 2018-06-29 DIAGNOSIS — I10 Essential (primary) hypertension: Secondary | ICD-10-CM | POA: Diagnosis not present

## 2018-06-29 DIAGNOSIS — M79605 Pain in left leg: Secondary | ICD-10-CM | POA: Diagnosis not present

## 2018-06-29 DIAGNOSIS — Z515 Encounter for palliative care: Secondary | ICD-10-CM

## 2018-06-29 DIAGNOSIS — N179 Acute kidney failure, unspecified: Secondary | ICD-10-CM | POA: Diagnosis present

## 2018-06-29 DIAGNOSIS — M1990 Unspecified osteoarthritis, unspecified site: Secondary | ICD-10-CM | POA: Diagnosis not present

## 2018-06-29 DIAGNOSIS — I129 Hypertensive chronic kidney disease with stage 1 through stage 4 chronic kidney disease, or unspecified chronic kidney disease: Secondary | ICD-10-CM | POA: Diagnosis present

## 2018-06-29 DIAGNOSIS — Z8249 Family history of ischemic heart disease and other diseases of the circulatory system: Secondary | ICD-10-CM

## 2018-06-29 DIAGNOSIS — I739 Peripheral vascular disease, unspecified: Secondary | ICD-10-CM | POA: Diagnosis present

## 2018-06-29 DIAGNOSIS — L97529 Non-pressure chronic ulcer of other part of left foot with unspecified severity: Secondary | ICD-10-CM | POA: Diagnosis not present

## 2018-06-29 DIAGNOSIS — L97528 Non-pressure chronic ulcer of other part of left foot with other specified severity: Secondary | ICD-10-CM | POA: Diagnosis present

## 2018-06-29 DIAGNOSIS — I70262 Atherosclerosis of native arteries of extremities with gangrene, left leg: Secondary | ICD-10-CM | POA: Diagnosis not present

## 2018-06-29 DIAGNOSIS — R2242 Localized swelling, mass and lump, left lower limb: Secondary | ICD-10-CM | POA: Diagnosis not present

## 2018-06-29 DIAGNOSIS — L089 Local infection of the skin and subcutaneous tissue, unspecified: Secondary | ICD-10-CM | POA: Diagnosis not present

## 2018-06-29 DIAGNOSIS — R609 Edema, unspecified: Secondary | ICD-10-CM | POA: Diagnosis not present

## 2018-06-29 DIAGNOSIS — Z87891 Personal history of nicotine dependence: Secondary | ICD-10-CM | POA: Diagnosis not present

## 2018-06-29 DIAGNOSIS — L97909 Non-pressure chronic ulcer of unspecified part of unspecified lower leg with unspecified severity: Secondary | ICD-10-CM

## 2018-06-29 DIAGNOSIS — D631 Anemia in chronic kidney disease: Secondary | ICD-10-CM | POA: Diagnosis present

## 2018-06-29 DIAGNOSIS — K219 Gastro-esophageal reflux disease without esophagitis: Secondary | ICD-10-CM | POA: Diagnosis present

## 2018-06-29 DIAGNOSIS — L97414 Non-pressure chronic ulcer of right heel and midfoot with necrosis of bone: Secondary | ICD-10-CM | POA: Diagnosis not present

## 2018-06-29 DIAGNOSIS — T148XXA Other injury of unspecified body region, initial encounter: Secondary | ICD-10-CM | POA: Diagnosis not present

## 2018-06-29 DIAGNOSIS — S81802A Unspecified open wound, left lower leg, initial encounter: Secondary | ICD-10-CM | POA: Diagnosis not present

## 2018-06-29 DIAGNOSIS — S88911A Complete traumatic amputation of right lower leg, level unspecified, initial encounter: Secondary | ICD-10-CM | POA: Diagnosis not present

## 2018-06-29 DIAGNOSIS — Z23 Encounter for immunization: Secondary | ICD-10-CM | POA: Diagnosis not present

## 2018-06-29 DIAGNOSIS — R600 Localized edema: Secondary | ICD-10-CM | POA: Diagnosis not present

## 2018-06-29 DIAGNOSIS — D500 Iron deficiency anemia secondary to blood loss (chronic): Secondary | ICD-10-CM | POA: Diagnosis not present

## 2018-06-29 LAB — COMPREHENSIVE METABOLIC PANEL
ALK PHOS: 46 U/L (ref 38–126)
ALT: 9 U/L (ref 0–44)
ANION GAP: 9 (ref 5–15)
AST: 13 U/L — ABNORMAL LOW (ref 15–41)
Albumin: 3.1 g/dL — ABNORMAL LOW (ref 3.5–5.0)
BILIRUBIN TOTAL: 0.4 mg/dL (ref 0.3–1.2)
BUN: 42 mg/dL — ABNORMAL HIGH (ref 8–23)
CALCIUM: 9.7 mg/dL (ref 8.9–10.3)
CO2: 22 mmol/L (ref 22–32)
Chloride: 104 mmol/L (ref 98–111)
Creatinine, Ser: 1.44 mg/dL — ABNORMAL HIGH (ref 0.44–1.00)
GFR calc Af Amer: 39 mL/min — ABNORMAL LOW (ref >60)
GFR calc non Af Amer: 33 mL/min — ABNORMAL LOW (ref >60)
GLUCOSE: 77 mg/dL (ref 70–99)
POTASSIUM: 4.6 mmol/L (ref 3.5–5.1)
Sodium: 135 mmol/L (ref 135–145)
Total Protein: 6.3 g/dL — ABNORMAL LOW (ref 6.5–8.1)

## 2018-06-29 LAB — CBC WITH DIFFERENTIAL/PLATELET
Abs Immature Granulocytes: 0 10*3/uL (ref 0.0–0.1)
Basophils Absolute: 0 10*3/uL (ref 0.0–0.1)
Basophils Relative: 1 %
EOS ABS: 0.6 10*3/uL (ref 0.0–0.7)
EOS PCT: 8 %
HEMATOCRIT: 26.6 % — AB (ref 36.0–46.0)
HEMOGLOBIN: 7.8 g/dL — AB (ref 12.0–15.0)
Immature Granulocytes: 1 %
LYMPHS ABS: 1.3 10*3/uL (ref 0.7–4.0)
LYMPHS PCT: 18 %
MCH: 26.6 pg (ref 26.0–34.0)
MCHC: 29.3 g/dL — AB (ref 30.0–36.0)
MCV: 90.8 fL (ref 78.0–100.0)
MONO ABS: 0.6 10*3/uL (ref 0.1–1.0)
Monocytes Relative: 8 %
Neutro Abs: 4.6 10*3/uL (ref 1.7–7.7)
Neutrophils Relative %: 64 %
Platelets: 353 10*3/uL (ref 150–400)
RBC: 2.93 MIL/uL — ABNORMAL LOW (ref 3.87–5.11)
RDW: 15.6 % — AB (ref 11.5–15.5)
WBC: 7.2 10*3/uL (ref 4.0–10.5)

## 2018-06-29 LAB — I-STAT TROPONIN, ED: Troponin i, poc: 0 ng/mL (ref 0.00–0.08)

## 2018-06-29 LAB — PREPARE RBC (CROSSMATCH)

## 2018-06-29 LAB — BRAIN NATRIURETIC PEPTIDE: B Natriuretic Peptide: 66.1 pg/mL (ref 0.0–100.0)

## 2018-06-29 LAB — POC OCCULT BLOOD, ED: Fecal Occult Bld: POSITIVE — AB

## 2018-06-29 MED ORDER — GABAPENTIN 100 MG PO CAPS
200.0000 mg | ORAL_CAPSULE | Freq: Two times a day (BID) | ORAL | Status: DC
  Administered 2018-06-29 – 2018-07-04 (×10): 200 mg via ORAL
  Filled 2018-06-29 (×10): qty 2

## 2018-06-29 MED ORDER — ONDANSETRON HCL 4 MG/2ML IJ SOLN: 4.0000 mg | Freq: Four times a day (QID) | INTRAMUSCULAR | Status: DC | PRN

## 2018-06-29 MED ORDER — ACETAMINOPHEN 325 MG PO TABS: 650.0000 mg | ORAL_TABLET | Freq: Four times a day (QID) | ORAL | Status: DC | PRN

## 2018-06-29 MED ORDER — HYDROCODONE-ACETAMINOPHEN 5-325 MG PO TABS
1.0000 | ORAL_TABLET | ORAL | Status: DC | PRN
Start: 2018-06-29 — End: 2018-07-04
  Administered 2018-06-29 – 2018-06-30 (×2): 2 via ORAL
  Administered 2018-06-30: 1 via ORAL
  Administered 2018-06-30 – 2018-07-04 (×12): 2 via ORAL
  Filled 2018-06-29 (×4): qty 2
  Filled 2018-06-29: qty 1
  Filled 2018-06-29 (×10): qty 2

## 2018-06-29 MED ORDER — ACETAMINOPHEN 650 MG RE SUPP: 650.0000 mg | Freq: Four times a day (QID) | RECTAL | Status: DC | PRN

## 2018-06-29 MED ORDER — CALCIUM CARBONATE-VITAMIN D 500-200 MG-UNIT PO TABS
1.0000 | ORAL_TABLET | Freq: Every evening | ORAL | Status: DC
  Administered 2018-06-30 – 2018-07-03 (×4): 1 via ORAL
  Filled 2018-06-29 (×4): qty 1

## 2018-06-29 MED ORDER — MORPHINE SULFATE (PF) 2 MG/ML IV SOLN
2.0000 mg | Freq: Once | INTRAVENOUS | Status: AC
  Administered 2018-06-30: 2 mg via INTRAVENOUS
  Filled 2018-06-29: qty 1

## 2018-06-29 MED ORDER — FAMOTIDINE 20 MG PO TABS
40.0000 mg | ORAL_TABLET | Freq: Every evening | ORAL | Status: DC
  Administered 2018-06-30 – 2018-07-03 (×4): 40 mg via ORAL
  Filled 2018-06-29 (×4): qty 2

## 2018-06-29 MED ORDER — ADULT MULTIVITAMIN W/MINERALS CH
1.0000 | ORAL_TABLET | Freq: Every evening | ORAL | Status: DC
  Administered 2018-06-30 – 2018-07-03 (×4): 1 via ORAL
  Filled 2018-06-29 (×4): qty 1

## 2018-06-29 MED ORDER — LEVOTHYROXINE SODIUM 175 MCG PO TABS
175.0000 ug | ORAL_TABLET | Freq: Every day | ORAL | Status: DC
  Administered 2018-06-30 – 2018-07-04 (×5): 175 ug via ORAL
  Filled 2018-06-29 (×6): qty 1

## 2018-06-29 MED ORDER — ACETAMINOPHEN 500 MG PO TABS
1000.0000 mg | ORAL_TABLET | Freq: Every day | ORAL | Status: DC
  Administered 2018-06-30 – 2018-07-04 (×5): 1000 mg via ORAL
  Filled 2018-06-29 (×5): qty 2

## 2018-06-29 MED ORDER — DOXYCYCLINE HYCLATE 100 MG PO TABS
100.0000 mg | ORAL_TABLET | Freq: Two times a day (BID) | ORAL | Status: DC
  Administered 2018-06-29 – 2018-07-01 (×4): 100 mg via ORAL
  Filled 2018-06-29 (×4): qty 1

## 2018-06-29 MED ORDER — PANTOPRAZOLE SODIUM 40 MG IV SOLR
40.0000 mg | Freq: Two times a day (BID) | INTRAVENOUS | Status: DC
  Administered 2018-06-30 – 2018-07-02 (×4): 40 mg via INTRAVENOUS
  Filled 2018-06-29 (×7): qty 40

## 2018-06-29 MED ORDER — DILTIAZEM HCL ER COATED BEADS 120 MG PO CP24
120.0000 mg | ORAL_CAPSULE | Freq: Every day | ORAL | Status: DC
  Administered 2018-06-30 – 2018-07-04 (×5): 120 mg via ORAL
  Filled 2018-06-29 (×5): qty 1

## 2018-06-29 MED ORDER — SODIUM CHLORIDE 0.9 % IV SOLN
10.0000 mL/h | Freq: Once | INTRAVENOUS | Status: AC
  Administered 2018-06-29: 10 mL/h via INTRAVENOUS

## 2018-06-29 MED ORDER — MAGNESIUM OXIDE 400 (241.3 MG) MG PO TABS
200.0000 mg | ORAL_TABLET | Freq: Every evening | ORAL | Status: DC
  Administered 2018-06-30 – 2018-07-03 (×4): 200 mg via ORAL
  Filled 2018-06-29 (×4): qty 1

## 2018-06-29 MED ORDER — ONDANSETRON HCL 4 MG PO TABS: 4.0000 mg | ORAL_TABLET | Freq: Four times a day (QID) | ORAL | Status: DC | PRN

## 2018-06-29 MED ORDER — VITAMIN C 500 MG PO TABS
500.0000 mg | ORAL_TABLET | Freq: Every evening | ORAL | Status: DC
  Administered 2018-06-30 – 2018-07-03 (×4): 500 mg via ORAL
  Filled 2018-06-29 (×4): qty 1

## 2018-06-29 NOTE — ED Triage Notes (Signed)
Patient arrived by EMS from [facility] office. EMS was called to transport patient due to low Hgb, low HCT, and low RBC. See chart

## 2018-06-29 NOTE — H&P (Signed)
History and Physical    Shelby Reese:[id number] DOB: 06-02-1935 DOA: 06/29/2018  PCP: [name], MD   Patient coming from: Home   Chief Complaint: Left foot redness and swelling, fatigue, low Hgb on outpatient labs   HPI: Shelby Reese is a 83 y.o. female with medical history significant for hypertension, hypothyroidism, chronic kidney disease stage III, history of hemorrhagic shock with source of bleeding not identified, and severe PAD status post right AKA and with chronic left foot wounds, now presenting to the emergency department for evaluation of fatigue, increased redness and swelling about the left foot wounds, and low hemoglobin on outpatient blood work.  Patient was admitted to the hospital in April of this year, evaluated for left foot ulcers, declined the recommended amputation, and was treated with stent to the left leg.  She had hemorrhagic shock during that hospitalization with no source of bleeding identified on EGD or colonoscopy.  Over the past month or so, she has had progressive fatigue and exertional dyspnea, and has noted increased redness and swelling about the left foot wounds in the past week.  She saw her PCP for these complaints, had outpatient blood work, was notified of a low hemoglobin and directed to the ED.  She denies melena, hematochezia, abdominal pain, vomiting, or indigestion.  She reports a history of hemorrhoids.  She takes Advil twice daily for chronic pain, and is prescribed a daily Protonix but has not been taking it.  ED Course: Upon arrival to the ED, patient is found to be afebrile, saturating well on room air, and with vitals otherwise normal.  EKG features a sinus rhythm and plain radiographs of the left foot are inconclusive with question of possible osteomyelitis involving the base of the fifth metatarsal.  Chemistry panel is notable for creatinine 1.44, up from 1.02 in June.  CBC features a normocytic anemia with hemoglobin 7.8, down from 10.2 in  June.  Fecal occult blood testing is positive and troponin is undetectable.  2 units of red blood cells were ordered from the ED and are transfusing at this time.  Patient remains hemodynamically stable and will be admitted for ongoing evaluation and management of symptomatic anemia and chronic left foot ulcers with mild surrounding cellulitis and acute kidney injury.  Review of Systems:  All other systems reviewed and apart from HPI, are negative.  Past Medical History:  Diagnosis Date  . Allergy   . Anemia   . CKD (chronic kidney disease), stage II   . Fibromyalgia   . GERD (gastroesophageal reflux disease)   . Hiatal hernia   . Hypertension   . Hypothyroid   . Lymphedema    venous insufficency  . Osteoarthritis   . Peripheral vascular disease ([place])   . Ulcer of knee ([place])    right  . Urinary incontinence   . Varicose veins   . Venous insufficiency     Past Surgical History:  Procedure Laterality Date  . AMPUTATION Right 12/15/2017   Procedure: AMPUTATION ABOVE KNEE, RIGHT;  Surgeon: Elam Dutch, MD;  Location: [facility];  Service: Vascular;  Laterality: Right;  . APPLICATION OF WOUND VAC Right 07/18/2017   Procedure: APPLICATION OF WOUND VAC;  Surgeon: [name], MD;  Location: [facility];  Service: Orthopedics;  Laterality: Right;  . CATARACT EXTRACTION W/ INTRAOCULAR LENS  IMPLANT, BILATERAL    . COLONOSCOPY    . COLONOSCOPY WITH PROPOFOL N/A 03/12/2018   Procedure: COLONOSCOPY WITH PROPOFOL;  Surgeon: Clarene Essex,  MD;  Location: [place] [facility];  Service: Endoscopy;  Laterality: N/A;  . ESOPHAGOGASTRODUODENOSCOPY Left 03/09/2018   Procedure: ESOPHAGOGASTRODUODENOSCOPY (EGD);  Surgeon: Ronnette Juniper, MD;  Location: [facility];  Service: Gastroenterology;  Laterality: Left;  . I&D EXTREMITY Right 07/18/2017   Procedure: IRRIGATION AND DEBRIDEMENT RIGHT KNEE;  Surgeon: [name], MD;  Location: [place];  Service: Orthopedics;  Laterality: Right;  . I&D EXTREMITY Right 07/23/2017    Procedure: REPEAT IRRIGATION AND DEBRIDEMENT RIGHT KNEE;  Surgeon: [name], MD;  Location: [facility];  Service: Orthopedics;  Laterality: Right;  . I&D EXTREMITY Right 11/28/2017   Procedure: IRRIGATION AND DEBRIDEMENT RIGHT KNEE , APPLY INSTILLATION VAC;  Surgeon: [name], MD;  Location: [place];  Service: Orthopedics;  Laterality: Right;  . I&D EXTREMITY Right 12/03/2017   Procedure: REPEAT IRRIGATION AND DEBRIDEMENT RIGHT KNEE AND APPLICATION OF A WOUND VAC.;  Surgeon: [name], MD;  Location: [facility];  Service: Orthopedics;  Laterality: Right;  . KNEE ARTHROSCOPY Left    menisectomy  . LOWER EXTREMITY ANGIOGRAPHY N/A 03/03/2018   Procedure: LOWER EXTREMITY ANGIOGRAPHY;  Surgeon: Serafina Mitchell, MD;  Location: [place] [facility];  Service: Cardiovascular;  Laterality: N/A;  . MULTIPLE TOOTH EXTRACTIONS    . PERIPHERAL VASCULAR ATHERECTOMY Left 03/03/2018   Procedure: PERIPHERAL VASCULAR ATHERECTOMY;  Surgeon: Serafina Mitchell, MD;  Location: [facility];  Service: Cardiovascular;  Laterality: Left;  common femoral and left SFA  . PERIPHERAL VASCULAR INTERVENTION  03/03/2018   Procedure: PERIPHERAL VASCULAR INTERVENTION;  Surgeon: Serafina Mitchell, MD;  Location: [place] [facility] [facility];  Service: Cardiovascular;;  left SFA  . SKIN SPLIT GRAFT Right 07/25/2017   Procedure: Repeat Irrigation and Debridement Right Knee, Split Thickness Skin Graft;  Surgeon: [name], MD;  Location: [facility];  Service: Orthopedics;  Laterality: Right;  . SKIN SPLIT GRAFT Right 12/05/2017   Procedure: SKIN GRAFT SPLIT THICKNESS WOUND KNEE, APPLY VAC;  Surgeon: [name], MD;  Location: [place];  Service: Orthopedics;  Laterality: Right;  . TONSILLECTOMY       reports that she quit smoking about 38 years ago. She has a 5.00 pack-year smoking history. She has never used smokeless tobacco. She reports that she drinks about 14.0 standard drinks of alcohol per week. She reports that she does not use  drugs.  Allergies  Allergen Reactions  . Cinnamon Hives  . Ciprofloxacin Other (See Comments)    TREMORS  . Diovan [Valsartan] Other (See Comments)    Extreme vertigo  . Food Diarrhea and Other (See Comments)    ORANGE JUICE   UPSET STOMACH  . Latex Rash and Other (See Comments)    Rash/inflammation due to exposure  . Nitrofuran Derivatives Hives and Rash    "Full body rash"  . Penicillins Hives and Swelling    *tolerated Ceftriaxone September 2018 Has patient had a PCN reaction causing immediate rash, facial/tongue/throat swelling, SOB or lightheadedness with hypotension:No--severe irritation at the injection site Has patient had a PCN reaction causing severe rash involving mucus membranes or skin necrosis:Unknown Has patient had a PCN reaction that required hospitalization:No Has patient had a PCN reaction occurring within the last 10 years:Yes If all of the above answers are "NO", then may proceed with  . Bactrim [Sulfamethoxazole-Trimethoprim] Diarrhea and Nausea Only  . Other Rash    Mycins, Strawberry, Oranges  . Sulfa Antibiotics Diarrhea and Nausea Only    Family History  Problem Relation Age  of Onset  . Stroke Mother   . Varicose Veins Mother   . Cancer Father        prostate  . Stroke Sister   . Heart disease Sister   . Varicose Veins Sister   . Stroke Maternal Grandmother   . Varicose Veins Sister      Prior to Admission medications   Medication Sig Start Date End Date Taking? Authorizing Provider  acetaminophen (TYLENOL) 500 MG tablet Take 1,000 mg by mouth daily.   Yes [provider]  aspirin EC 81 MG tablet Take 81 mg by mouth every evening.    Yes [provider]  benazepril (LOTENSIN) 40 MG tablet Take 40 mg by mouth daily.   Yes [provider]  Calcium Carbonate-Vitamin D (CALCIUM 600+D) 600-400 MG-UNIT per tablet Take 1 tablet by mouth every evening.    Yes [provider]  cetirizine (ZYRTEC) 10 MG tablet Take 10  mg by mouth at bedtime.    Yes [provider]  diltiazem (TIAZAC) 120 MG 24 hr capsule HOLD until seen by your PCP Patient taking differently: Take 120 mg by mouth daily.  03/18/18  Yes Oretha Milch D, MD  diphenhydrAMINE (BENADRYL) 25 mg capsule Take 25 mg by mouth 3 (three) times daily.    Yes [provider]  famotidine (PEPCID) 40 MG tablet Take 40 mg by mouth every evening.    Yes [provider]  ferrous sulfate 325 (65 FE) MG tablet Take 487.5 mg by mouth every evening.    Yes [provider]  FIBER PO Take 1 capsule by mouth 2 (two) times daily.   Yes [provider]  gabapentin (NEURONTIN) 100 MG capsule Take 200 mg by mouth 2 (two) times daily.    Yes [provider]  ibuprofen (ADVIL,MOTRIN) 200 MG tablet Take 400 mg by mouth daily.   Yes [provider]  levothyroxine (SYNTHROID, LEVOTHROID) 175 MCG tablet Take 175 mcg by mouth daily before breakfast.   Yes [provider]  Magnesium 125 MG CAPS Take 125 mg by mouth every evening.    Yes [provider]  Multiple Vitamins-Minerals (MULTIVITAMIN WITH MINERALS) tablet Take 1 tablet by mouth every evening.    Yes [provider]  nystatin-triamcinolone (MYCOLOG II) cream Apply 1 application topically See admin instructions. Apply topically to left leg every afternoon for wound care   Yes [provider]  vitamin C (ASCORBIC ACID) 500 MG tablet Take 500 mg by mouth every evening.    Yes [provider]  glucosamine-chondroitin 500-400 MG tablet Take 1 tablet by mouth every evening.     [provider]  oxyCODONE-acetaminophen (PERCOCET) 10-325 MG tablet Take 1 tablet by mouth every 4 (four) hours as needed for pain. Patient not taking: Reported on 06/29/2018 04/29/18   Dalia Heading, PA-C  pantoprazole (PROTONIX) 40 MG tablet Take 1 tablet (40 mg total) by mouth daily. Patient not taking: Reported on 06/29/2018 12/24/17    Bonnell Public, MD    Physical Exam: Vitals:   06/29/18 2045 06/29/18 2051 06/29/18 2100 06/29/18 2115  BP: (!) 165/58  (!) 158/58 (!) 145/69  Pulse: 95  85 80  Resp: 10  13 12   Temp:  97.9 F (36.6 C)    TempSrc:  Axillary    SpO2: 100%  97% 98%      Constitutional: NAD, calm, pale Eyes: PERTLA, lids and conjunctivae normal ENMT: Mucous membranes are moist. Posterior pharynx clear of any  exudate or lesions.   Neck: normal, supple, no masses, no thyromegaly Respiratory: clear to auscultation bilaterally, no wheezing, no crackles. Normal respiratory effort.    Cardiovascular: S1 & S2 heard, regular rate and rhythm. No significant JVD. Abdomen: No distension, no tenderness, soft. Bowel sounds normal.  Musculoskeletal: no clubbing / cyanosis. Status-post right AKA.    Skin: Ulcers to dorsal left foot with surrounding erythema, heat, and swelling. Warm, dry, well-perfused. Neurologic: CN 2-12 grossly intact. Strength 5/5 in all 4 limbs.  Psychiatric: Alert and oriented to person, place, and situation. Calm, cooperative.     Labs on Admission: I have personally reviewed following labs and imaging studies  CBC: Recent Labs  Lab 06/29/18 1719  WBC 7.2  NEUTROABS 4.6  HGB 7.8*  HCT 26.6*  MCV 90.8  PLT 130   Basic Metabolic Panel: Recent Labs  Lab 06/29/18 1719  NA 135  K 4.6  CL 104  CO2 22  GLUCOSE 77  BUN 42*  CREATININE 1.44*  CALCIUM 9.7   GFR: CrCl cannot be calculated (Unknown ideal weight.). Liver Function Tests: Recent Labs  Lab 06/29/18 1719  AST 13*  ALT 9  ALKPHOS 46  BILITOT 0.4  PROT 6.3*  ALBUMIN 3.1*   No results for input(s): LIPASE, AMYLASE in the last 168 hours. No results for input(s): AMMONIA in the last 168 hours. Coagulation Profile: No results for input(s): INR, PROTIME in the last 168 hours. Cardiac Enzymes: No results for input(s): CKTOTAL, CKMB, CKMBINDEX, TROPONINI in the last 168 hours. BNP (last 3 results) No  results for input(s): PROBNP in the last 8760 hours. HbA1C: No results for input(s): HGBA1C in the last 72 hours. CBG: No results for input(s): GLUCAP in the last 168 hours. Lipid Profile: No results for input(s): CHOL, HDL, LDLCALC, TRIG, CHOLHDL, LDLDIRECT in the last 72 hours. Thyroid Function Tests: No results for input(s): TSH, T4TOTAL, FREET4, T3FREE, THYROIDAB in the last 72 hours. Anemia Panel: No results for input(s): VITAMINB12, FOLATE, FERRITIN, TIBC, IRON, RETICCTPCT in the last 72 hours. Urine analysis:    Component Value Date/Time   COLORURINE YELLOW 04/28/2018 2105   APPEARANCEUR CLEAR 04/28/2018 2105   LABSPEC 1.014 04/28/2018 2105   PHURINE 5.0 04/28/2018 2105   GLUCOSEU NEGATIVE 04/28/2018 2105   HGBUR NEGATIVE 04/28/2018 2105   BILIRUBINUR NEGATIVE 04/28/2018 2105   [place] NEGATIVE 04/28/2018 2105   PROTEINUR NEGATIVE 04/28/2018 2105   NITRITE NEGATIVE 04/28/2018 2105   LEUKOCYTESUR NEGATIVE 04/28/2018 2105   Sepsis Labs: @LABRCNTIP (procalcitonin:4,lacticidven:4) )No results found for this or any previous visit (from the past 240 hour(s)).   Radiological Exams on Admission: Dg Foot Complete Left  Result Date: 06/29/2018 CLINICAL DATA:  83 year old with chronic LEFT foot ulceration which has recently worsened. EXAM: LEFT FOOT - COMPLETE 3+ VIEW COMPARISON:  04/28/2018, 02/23/2018. FINDINGS: Diffuse soft tissue swelling. Ulceration involving the DORSAL surface overlying the midfoot. Since the examination 2 months ago, osteolysis involving the base of the fifth metatarsal. No evidence of osteomyelitis elsewhere. Severe degenerative changes involving the midfoot. Hammertoe deformities. IMPRESSION: 1. Possible osteomyelitis involving the base of the fifth metatarsal. No evidence of osteomyelitis elsewhere. 2. Severe degenerative changes involving the midfoot. Electronically Signed   By: Evangeline Dakin M.D.   On: 06/29/2018 18:40    EKG: Independently reviewed.  Sinus rhythm.   Assessment/Plan   1. Symptomatic anemia; IDA   - Presents with fatigue and increased redness and swelling around chronic left foot ulcers  - Found to have Hgb of  7.8, down from 10.2 in June; she denies melena or hematochezia, stool reportedly brown in ED but FOBT+  - She had hemorrhagic shock in April but no definite source of bleeding was identified  - She has been using Advil BID for chronic pain and stopped taking her PPI but denies abdominal pain or indigestion; BUN is up, but so is creatinine   - She is currently being transfused RBC in ED  - Start IV PPI q12h for now, check post-transfusion CBC   2. Acute kidney injury superimposed on CKD III  - SCr is 1.44 on admission, up from 1.02 in June '19  - Hold ACE-i, check urine chemistries, renally-dose medications, repeat chem panel in am   3. Cellulitis; PAD with chronic left foot ulcers  - Presents with increased redness and swelling around her chronic left foot ulcers  - She has severe PAD s/p right AKA, refused left AKA earlier this year and had stent placed  - There is no fever or leukocytosis; plain radiographs with possible osteomyelitis  - She is being transfused, will try to add inflammatory markers onto earlier draw  - Treat mild surrounding cellulitis with doxycycline, follow-up inflammatory markers, culture if febrile, consider MRI for possible osteo    4. Hypertension  - BP at goal  - Continue diltiazem, hold ACE-i until renal function stabilizes   5. Hypothyroidism  - Continue Synthroid    DVT prophylaxis: SCD Code Status: Full  Family Communication: Discussed with patient  Consults called: None Admission status: Inpatient     Vianne Bulls, MD [facility] Pager [phone]  If 7PM-7AM, please contact night-coverage [email or web address] Password [facility]  06/29/2018, 9:44 PM

## 2018-06-29 NOTE — ED Provider Notes (Signed)
[facility] EMERGENCY DEPARTMENT Provider Note   CSN: [id number] Arrival date & time: 06/29/18  1656     History   Chief Complaint Chief Complaint  Patient presents with  . Abnormal Labs    abnormal labs    HPI Shelby Reese is a 83 y.o. female.  Patient is an 83 year old female with a history of stage II chronic kidney disease, GERD, anemia, hypertension, peripheral vascular disease with chronic venous insufficiency and ongoing wound to the left lower extremity who is presenting from her PCPs office with abnormal labs.  Patient states she went to the PCP today because over the last 3 to 4 days she has had worsening swelling in her left lower extremity, mild erythema but denies any worsening of the wounds on her foot.  She states they have been slightly more painful in the last few days as well.  She denies any fever or infectious symptoms.  While at the [facility] office they checked labs and noted that the patient had a hemoglobin of 6.3 today.  She denies any blood in her stool but does admit to taking 2 ibuprofen daily for the last year.  She also states she did have to have a blood transfusion approximately 1 year ago and they never found the cause of the bleed.  She does not take anticoagulation.  He denies having any chest pain or shortness of breath.  She has had no abdominal pain, nausea or vomiting.  The history is provided by the patient.    Past Medical History:  Diagnosis Date  . Allergy   . Anemia   . CKD (chronic kidney disease), stage II   . Fibromyalgia   . GERD (gastroesophageal reflux disease)   . Hiatal hernia   . Hypertension   . Hypothyroid   . Lymphedema    venous insufficency  . Osteoarthritis   . Peripheral vascular disease ([place])   . Ulcer of knee ([place])    right  . Urinary incontinence   . Varicose veins   . Venous insufficiency     Patient Active Problem List   Diagnosis Date Noted  . Melena 03/09/2018  . Shocks, hemorrhagic  ([place]) 03/09/2018  . GI bleed 03/09/2018  . Fecal occult blood test positive   . Hypervolemia   . Acute lower UTI 03/04/2018  . AKI (acute kidney injury) ([place])   . Arterioloscleroses 03/02/2018  . Anemia 02/23/2018  . Foot ulcer ([place]) 02/23/2018  . Gangrene of foot ([place]) 02/18/2018  . Depression due to physical illness 02/18/2018  . Chronic ulcer of right heel with necrosis of bone ([place]) 12/27/2017  . Peripheral vascular disease of lower extremity with ulceration ([place]) 12/27/2017  . Acute renal failure superimposed on stage 2 chronic kidney disease ([place]) 12/27/2017  . GERD (gastroesophageal reflux disease) 12/27/2017  . Acute blood loss as cause of postoperative anemia 12/13/2017  . Wound of right leg, sequela 12/12/2017  . Open wound of right knee 11/28/2017  . Allergic drug rash   . Sepsis ([place]) 08/29/2017  . CKD (chronic kidney disease), stage III ([place]) 08/29/2017  . Hypothyroid 08/29/2017  . Hypertension 08/29/2017  . Peripheral vascular disease ([place]) 08/29/2017  . Skin ulcer of knee, right, with fat layer exposed ([place]) 08/29/2017  . Acute kidney injury ([place]) 08/29/2017  . Metabolic acidosis 56/43/3295  . Acute hyponatremia 08/29/2017  . Pressure injury of skin 07/25/2017  . Wound, open, knee, lower leg, or ankle with complication, right, initial encounter   .  Idiopathic chronic venous hypertension of both lower extremities with ulcer and inflammation ([place]) 07/15/2017  . Skin ulcer of right knee with necrosis of muscle ([place]) 07/15/2017  . Atherosclerosis of artery of right lower extremity ([place]) 07/11/2017  . Essential hypertension 07/11/2017  . Osteoporosis 07/11/2017  . Cataracts, bilateral 07/11/2017  . Hypothyroidism, acquired 12/27/2014  . Iron deficiency anemia 12/27/2014  . Hiatal hernia 12/27/2014  . Varicose veins of lower extremities with other complications 18/29/9371    Past Surgical History:  Procedure Laterality Date  . AMPUTATION Right 12/15/2017    Procedure: AMPUTATION ABOVE KNEE, RIGHT;  Surgeon: Elam Dutch, MD;  Location: [facility];  Service: Vascular;  Laterality: Right;  . APPLICATION OF WOUND VAC Right 07/18/2017   Procedure: APPLICATION OF WOUND VAC;  Surgeon: [name], MD;  Location: [facility];  Service: Orthopedics;  Laterality: Right;  . CATARACT EXTRACTION W/ INTRAOCULAR LENS  IMPLANT, BILATERAL    . COLONOSCOPY    . COLONOSCOPY WITH PROPOFOL N/A 03/12/2018   Procedure: COLONOSCOPY WITH PROPOFOL;  Surgeon: Clarene Essex, MD;  Location: [facility];  Service: Endoscopy;  Laterality: N/A;  . ESOPHAGOGASTRODUODENOSCOPY Left 03/09/2018   Procedure: ESOPHAGOGASTRODUODENOSCOPY (EGD);  Surgeon: Ronnette Juniper, MD;  Location: [facility];  Service: Gastroenterology;  Laterality: Left;  . I&D EXTREMITY Right 07/18/2017   Procedure: IRRIGATION AND DEBRIDEMENT RIGHT KNEE;  Surgeon: [name], MD;  Location: [facility];  Service: Orthopedics;  Laterality: Right;  . I&D EXTREMITY Right 07/23/2017   Procedure: REPEAT IRRIGATION AND DEBRIDEMENT RIGHT KNEE;  Surgeon: [name], MD;  Location: [facility];  Service: Orthopedics;  Laterality: Right;  . I&D EXTREMITY Right 11/28/2017   Procedure: IRRIGATION AND DEBRIDEMENT RIGHT KNEE , APPLY INSTILLATION VAC;  Surgeon: [name], MD;  Location: [facility];  Service: Orthopedics;  Laterality: Right;  . I&D EXTREMITY Right 12/03/2017   Procedure: REPEAT IRRIGATION AND DEBRIDEMENT RIGHT KNEE AND APPLICATION OF A WOUND VAC.;  Surgeon: [name], MD;  Location: [facility];  Service: Orthopedics;  Laterality: Right;  . KNEE ARTHROSCOPY Left    menisectomy  . LOWER EXTREMITY ANGIOGRAPHY N/A 03/03/2018   Procedure: LOWER EXTREMITY ANGIOGRAPHY;  Surgeon: Serafina Mitchell, MD;  Location: [facility];  Service: Cardiovascular;  Laterality: N/A;  . MULTIPLE TOOTH EXTRACTIONS    . PERIPHERAL VASCULAR ATHERECTOMY Left 03/03/2018   Procedure: PERIPHERAL VASCULAR ATHERECTOMY;  Surgeon: Serafina Mitchell, MD;   Location: [facility];  Service: Cardiovascular;  Laterality: Left;  common femoral and left SFA  . PERIPHERAL VASCULAR INTERVENTION  03/03/2018   Procedure: PERIPHERAL VASCULAR INTERVENTION;  Surgeon: Serafina Mitchell, MD;  Location: [facility];  Service: Cardiovascular;;  left SFA  . SKIN SPLIT GRAFT Right 07/25/2017   Procedure: Repeat Irrigation and Debridement Right Knee, Split Thickness Skin Graft;  Surgeon: [name], MD;  Location: [facility];  Service: Orthopedics;  Laterality: Right;  . SKIN SPLIT GRAFT Right 12/05/2017   Procedure: SKIN GRAFT SPLIT THICKNESS WOUND KNEE, APPLY VAC;  Surgeon: [name], MD;  Location: [facility];  Service: Orthopedics;  Laterality: Right;  . TONSILLECTOMY       OB History   None      Home Medications    Prior to Admission medications   Medication Sig Start Date End Date Taking? Authorizing Provider  aspirin EC 81 MG tablet Take 81 mg by mouth daily with breakfast.    [provider]  benazepril (LOTENSIN) 40  MG tablet Take 40 mg by mouth daily.    [provider]  Calcium Carbonate-Vitamin D (CALCIUM 600+D) 600-400 MG-UNIT per tablet Take 1 tablet by mouth daily.     [provider]  cetirizine (ZYRTEC) 10 MG tablet Take 10 mg by mouth at bedtime.     [provider]  diltiazem (TIAZAC) 120 MG 24 hr capsule HOLD until seen by your PCP Patient taking differently: Take 120 mg by mouth daily.  03/18/18   Oretha Milch D, MD  diphenhydrAMINE (BENADRYL) 25 mg capsule Take 25 mg by mouth every 8 (eight) hours as needed for allergies.    [provider]  ELEMENTAL MAGNESIUM PO Take 125 mg by mouth daily.    [provider]  famotidine (PEPCID) 40 MG tablet Take 40 mg by mouth daily. 1600     [provider]  ferrous sulfate 325 (65 FE) MG tablet Take 325 mg by mouth daily.     [provider]  gabapentin (NEURONTIN) 100 MG capsule Take 100 mg by mouth 2 (two) times  daily.    [provider]  glucosamine-chondroitin 500-400 MG tablet Take 1 tablet by mouth 3 (three) times daily.    [provider]  Multiple Vitamins-Minerals (MULTIVITAMIN WITH MINERALS) tablet Take 1 tablet by mouth daily.    [provider]  nystatin-triamcinolone (MYCOLOG II) cream Apply 1 application topically 2 (two) times daily.    [provider]  oxyCODONE-acetaminophen (PERCOCET) 10-325 MG tablet Take 1 tablet by mouth every 4 (four) hours as needed for pain. 04/29/18   Lawyer, Harrell Gave, PA-C  oxyCODONE-acetaminophen (PERCOCET/ROXICET) 5-325 MG tablet  05/13/18   [provider]  pantoprazole (PROTONIX) 40 MG tablet Take 1 tablet (40 mg total) by mouth daily. 12/24/17   Bonnell Public, MD  polycarbophil (FIBERCON) 625 MG tablet Take 625 mg by mouth daily.    [provider]  SYNTHROID 175 MCG tablet Take 175 mcg by mouth daily before breakfast.  09/20/15   [provider]  vitamin C (ASCORBIC ACID) 500 MG tablet Take 500 mg by mouth daily.    [provider]    Family History Family History  Problem Relation Age of Onset  . Stroke Mother   . Varicose Veins Mother   . Cancer Father        prostate  . Stroke Sister   . Heart disease Sister   . Varicose Veins Sister   . Stroke Maternal Grandmother   . Varicose Veins Sister     Social History Social History   Tobacco Use  . Smoking status: Former Smoker    Packs/day: 0.50    Years: 10.00    Pack years: 5.00    Last attempt to quit: 11/19/1979    Years since quitting: 38.6  . Smokeless tobacco: Never Used  Substance Use Topics  . Alcohol use: Yes    Alcohol/week: 14.0 standard drinks    Types: 14 Glasses of wine per week    Comment: 2 glasses of wine with dinner  . Drug use: No     Allergies   Cinnamon; Ciprofloxacin; Diovan [valsartan]; Food; Latex; Nitrofuran derivatives; Penicillins; Bactrim [sulfamethoxazole-trimethoprim]; Other; and  Sulfa antibiotics   Review of Systems Review of Systems  All other systems reviewed and are negative.    Physical Exam Updated Vital Signs BP (!) 138/53   Pulse 79   Temp 97.7 F (36.5 C) (Oral)   Resp 14   SpO2 100%  Physical Exam  Constitutional: She is oriented to person, place, and time. She appears well-developed and well-nourished. No distress.  HENT:  Head: Normocephalic and atraumatic.  Mouth/Throat: Oropharynx is clear and moist.  Eyes: Pupils are equal, round, and reactive to light. Conjunctivae and EOM are normal.  Neck: Normal range of motion. Neck supple.  Cardiovascular: Normal rate, regular rhythm and intact distal pulses.  Murmur heard.  Systolic murmur is present with a grade of 3/6. Pulmonary/Chest: Effort normal and breath sounds normal. No respiratory distress. She has no wheezes. She has no rales.  Abdominal: Soft. She exhibits no distension. There is no tenderness. There is no rebound and no guarding.  Genitourinary: Rectal exam shows guaiac positive stool.  Genitourinary Comments: Stool normal color  Musculoskeletal: Normal range of motion. She exhibits edema. She exhibits no tenderness.  Right aka at the hip.  Left lower leg with warmth, mild erythema and edema.  3 chronic wounds over the left foot without surrounding erythema  Neurological: She is alert and oriented to person, place, and time.  Skin: Skin is warm and dry. No rash noted. No erythema. There is pallor.  Psychiatric: She has a normal mood and affect. Her behavior is normal.  Nursing note and vitals reviewed.    ED Treatments / Results  Labs (all labs ordered are listed, but only abnormal results are displayed) Labs Reviewed  CBC WITH DIFFERENTIAL/PLATELET - Abnormal; Notable for the following components:      Result Value   RBC 2.93 (*)    Hemoglobin 7.8 (*)    HCT 26.6 (*)    MCHC 29.3 (*)    RDW 15.6 (*)    All other components within normal limits  COMPREHENSIVE METABOLIC  PANEL - Abnormal; Notable for the following components:   BUN 42 (*)    Creatinine, Ser 1.44 (*)    Total Protein 6.3 (*)    Albumin 3.1 (*)    AST 13 (*)    GFR calc non Af Amer 33 (*)    GFR calc Af Amer 39 (*)    All other components within normal limits  POC OCCULT BLOOD, ED - Abnormal; Notable for the following components:   Fecal Occult Bld POSITIVE (*)    All other components within normal limits  BRAIN NATRIURETIC PEPTIDE  I-STAT TROPONIN, ED  TYPE AND SCREEN  PREPARE RBC (CROSSMATCH)    EKG None ED ECG REPORT   Date: 06/29/2018  Rate: 75  Rhythm: normal sinus rhythm  QRS Axis: normal  Intervals: normal  ST/T Wave abnormalities: normal  Conduction Disutrbances:none  Narrative Interpretation:   Old EKG Reviewed: unchanged  I have personally reviewed the EKG tracing and agree with the computerized printout as noted.  Radiology Dg Foot Complete Left  Result Date: 06/29/2018 CLINICAL DATA:  83 year old with chronic LEFT foot ulceration which has recently worsened. EXAM: LEFT FOOT - COMPLETE 3+ VIEW COMPARISON:  04/28/2018, 02/23/2018. FINDINGS: Diffuse soft tissue swelling. Ulceration involving the DORSAL surface overlying the midfoot. Since the examination 2 months ago, osteolysis involving the base of the fifth metatarsal. No evidence of osteomyelitis elsewhere. Severe degenerative changes involving the midfoot. Hammertoe deformities. IMPRESSION: 1. Possible osteomyelitis involving the base of the fifth metatarsal. No evidence of osteomyelitis elsewhere. 2. Severe degenerative changes involving the midfoot. Electronically Signed   By: Evangeline Dakin M.D.   On: 06/29/2018 18:40    Procedures Procedures (including critical care time)  Medications Ordered in ED Medications - No data to display  Initial Impression / Assessment and Plan / ED Course  I have reviewed the triage vital signs and the nursing notes.  Pertinent labs & imaging results that were available  during my care of the patient were reviewed by me and considered in my medical decision making (see chart for details).     Patient presenting today for 2 separate issues.  Went to her PCP today because of worsening swelling in her left lower leg and pain.  Patient has chronic wounds there and concern for possible early osteomyelitis versus ongoing chronic issue with her chronic venous stasis.  No significant findings for cellulitis at this time and lower suspicion for CHF.  Patient also was noted to have a hemoglobin of 6.3 today in the doctor's office with a history of having transfusion within the last year for similar symptoms.  Patient's Hemoccult is positive but stools are normal color.  Repeat CBC, CMP, BNP, troponin are pending.  Will get x-ray of the foot to ensure no concerns for osteomyelitis.  EKG without acute findings.  Patient was typed and crossed and will plan on giving blood assuming hemoglobin is informed to be in the 6 range.  7:20 PM EKG unchanged.  Fecal occult positive.  Mono within normal limits, hemoglobin here is 7.8 which is a significant drop from prior and will transfuse 2 units.  Though x-ray of the patient's foot is concerning for possible osteomyelitis at the base of the fifth metatarsal.  Since white count is within normal limits and this may be more of a chronic picture.  Will not start antibiotics at this time but will allow medicine to further work this up.  CRITICAL CARE Performed by: Dardan Shelton Total critical care time: 30 minutes Critical care time was exclusive of separately billable procedures and treating other patients. Critical care was necessary to treat or prevent imminent or life-threatening deterioration. Critical care was time spent personally by me on the following activities: development of treatment plan with patient and/or surrogate as well as nursing, discussions with consultants, evaluation of patient's response to treatment, examination of  patient, obtaining history from patient or surrogate, ordering and performing treatments and interventions, ordering and review of laboratory studies, ordering and review of radiographic studies, pulse oximetry and re-evaluation of patient's condition.   Final Clinical Impressions(s) / ED Diagnoses   Final diagnoses:  Symptomatic anemia  Chronic osteoarthritis    ED Discharge Orders    None       Blanchie Dessert, MD 06/29/18 2002

## 2018-06-29 NOTE — ED Notes (Signed)
Regular dinner tray ordered 

## 2018-06-29 NOTE — ED Notes (Signed)
ED Provider at bedside. 

## 2018-06-30 DIAGNOSIS — T148XXA Other injury of unspecified body region, initial encounter: Secondary | ICD-10-CM

## 2018-06-30 DIAGNOSIS — D649 Anemia, unspecified: Secondary | ICD-10-CM

## 2018-06-30 DIAGNOSIS — I10 Essential (primary) hypertension: Secondary | ICD-10-CM

## 2018-06-30 DIAGNOSIS — N183 Chronic kidney disease, stage 3 (moderate): Secondary | ICD-10-CM

## 2018-06-30 DIAGNOSIS — L089 Local infection of the skin and subcutaneous tissue, unspecified: Secondary | ICD-10-CM

## 2018-06-30 DIAGNOSIS — N179 Acute kidney failure, unspecified: Secondary | ICD-10-CM

## 2018-06-30 LAB — CBC
HCT: 25.2 % — ABNORMAL LOW (ref 36.0–46.0)
Hemoglobin: 7.9 g/dL — ABNORMAL LOW (ref 12.0–15.0)
MCH: 27.3 pg (ref 26.0–34.0)
MCHC: 31.3 g/dL (ref 30.0–36.0)
MCV: 87.2 fL (ref 78.0–100.0)
PLATELETS: 298 10*3/uL (ref 150–400)
RBC: 2.89 MIL/uL — ABNORMAL LOW (ref 3.87–5.11)
RDW: 15.5 % (ref 11.5–15.5)
WBC: 7.9 10*3/uL (ref 4.0–10.5)

## 2018-06-30 LAB — BASIC METABOLIC PANEL
Anion gap: 8 (ref 5–15)
BUN: 35 mg/dL — ABNORMAL HIGH (ref 8–23)
CALCIUM: 9.5 mg/dL (ref 8.9–10.3)
CHLORIDE: 106 mmol/L (ref 98–111)
CO2: 23 mmol/L (ref 22–32)
CREATININE: 1.47 mg/dL — AB (ref 0.44–1.00)
GFR calc Af Amer: 38 mL/min — ABNORMAL LOW (ref >60)
GFR calc non Af Amer: 32 mL/min — ABNORMAL LOW (ref >60)
Glucose, Bld: 89 mg/dL (ref 70–99)
Potassium: 4.7 mmol/L (ref 3.5–5.1)
SODIUM: 137 mmol/L (ref 135–145)

## 2018-06-30 LAB — SEDIMENTATION RATE
SED RATE: 62 mm/h — AB (ref 0–22)
Sed Rate: 58 mm/hr — ABNORMAL HIGH (ref 0–22)

## 2018-06-30 LAB — C-REACTIVE PROTEIN: CRP: 1.8 mg/dL — AB (ref <1.0)

## 2018-06-30 MED ORDER — JUVEN PO PACK
1.0000 | PACK | Freq: Two times a day (BID) | ORAL | Status: DC
  Administered 2018-07-01 – 2018-07-04 (×6): 1 via ORAL
  Filled 2018-06-30 (×10): qty 1

## 2018-06-30 MED ORDER — MORPHINE SULFATE (PF) 2 MG/ML IV SOLN
2.0000 mg | Freq: Once | INTRAVENOUS | Status: AC
  Administered 2018-07-01: 2 mg via INTRAVENOUS
  Filled 2018-06-30: qty 1

## 2018-06-30 NOTE — Progress Notes (Signed)
Paged Dr on call per patient in pain 10/10. Gave pain medication an hour ago which was not effective

## 2018-06-30 NOTE — Progress Notes (Signed)
Low bed arrived to room. Pt refused to be placed in low bed.

## 2018-06-30 NOTE — Consult Note (Addendum)
[facility] Nurse wound consult note Patient evaluated in [place].  No family present. Reason for Consult: Lower Extremity Wound Wound type: Chronic, non-healing wounds with a punched out appearance.  Possibly related to arterial impairment? Measurement: The dorsum of the left foot has two distinct wounds.  The largest is closest to the ankle and measures 6.7 cm x 6 cm and is 100% necrotic with devitalized tendon or ligament in the wound bed.   The smaller wound is inferior to the first and measures 2.8 cm x 3.2 cm and has necrotic tissue in the wound bed and what appears to be a small segment of tendon or ligament that is also devitalized. Periwound: She has erythema that extends to the knee on the left that is consistent with cellulitis.  The patient states she may go to surgery today for the wounds, although no one from surgery has spoken with her yet.  She was tearful when she saw the appearance of the wounds.  She states it has been days since the dressings were changed, but the last time they were changed the wounds looked better than they do now.  For now, I have put in conservative dressing orders while we await possible surgical intervention.  If she does not go to surgery, then daily application of Santyl may be helpful in removing the necrotic tissue, but not the devitalized tendon/ligament. Dressing procedure/placement/frequency:  Xeroform gauze, secured with kerlex daily.  I applied this dressing today at the conclusion of my assessment. Monitor the wound area(s) for worsening of condition such as: Signs/symptoms of infection,  Increase in size,  Development of or worsening of odor, Development of pain, or increased pain at the affected locations.  Notify the medical team if any of these develop.  Thank you for the consult.  Discussed plan of care with the patient and bedside nurse.  [facility] nurse will not follow at this time.  Please re-consult the [facility] team if needed.  Val Riles, RN, MSN, CWOCN,  CNS-BC, pager [phone]

## 2018-06-30 NOTE — Progress Notes (Signed)
[facility] PROGRESS NOTE  Shelby Reese NUU:[id number] DOB: August 11, 1935 DOA: 06/29/2018 PCP: [name], MD  Assessment/Plan:   ## Symptomatic anemia of chr disease   - Presents with fatigue and increased redness and swelling around chronic left foot ulcers  - Found to have Hgb of 7.8, down from 10.2 in June; she denies melena or hematochezia, stool reportedly brown in ED but FOBT+  - She had hemorrhagic shock in April but no definite source of bleeding was identified  - She has been using Advil BID for chronic pain and stopped taking her PPI but denies abdominal pain or indigestion; BUN is up, but so is creatinine   - She is currently being transfused RBC in ED  - Start IV PPI q12h for now, check post-transfusion CBC   ## INfected nonhealing wound of lt ankle -MR ankle to r/o osteo - consult ortho - hold abx as pt is not septic  ## severe PVD - s/p AKA rt - stent to left  ## Acute kidney injury superimposed on CKD III  - SCr is 1.44 on admission, up from 1.02 in June '19  - Hold ACE-i, check urine chemistries, renally-dose medications, repeat chem panel in am    ## Hypertension  - BP at goal  - Continue diltiazem, hold ACE-i until renal function stabilizes   ## Hypothyroidism  - Continue Synthroid   Code Status:  full Family Communication:  Patient  Disposition Plan:  SNF   Consultants:   ortho  HPI/Subjective:  frustrated ablout the wound, gen weakness  Objective: Vitals:   06/30/18 0536 06/30/18 1414  BP: 140/60 (!) 146/55  Pulse: 88 77  Resp: 18 16  Temp: 98.7 F (37.1 C) 98.1 F (36.7 C)  SpO2: 95% 97%    Intake/Output Summary (Last 24 hours) at 06/30/2018 1855 Last data filed at 06/30/2018 1800 Gross per 24 hour  Intake 1388.23 ml  Output 1825 ml  Net -436.77 ml   Filed Weights   06/29/18 2228  Weight: 81.7 kg    Exam:  Constitutional: NAD, calm,  Eyes: PERTLA, lids and conjunctivae normal ENMT: Mucous membranes are moist.  Posterior pharynx clear of any exudate or lesions.   Neck: normal, supple, no masses, no thyromegaly Respiratory: clear to auscultation bilaterally, no wheezing, no crackles. Normal respiratory effort.    Cardiovascular: S1 & S2 heard, regular rate and rhythm. No significant JVD. Abdomen: No distension, no tenderness, soft. Bowel sounds normal.  Musculoskeletal: no clubbing / cyanosis. Status-post right AKA.    Skin: Ulcers to dorsal leftankle wiith surrounding erythema, purulant drainge. Neurologic: CN 2-12 grossly intact. Strength 5/5 in all 4 limbs.  Psychiatric: Alert and oriented to person, place, and situation. Calm, cooperative Data Reviewed: Basic Metabolic Panel: Recent Labs  Lab 06/29/18 1719 06/30/18 0451  NA 135 137  K 4.6 4.7  CL 104 106  CO2 22 23  GLUCOSE 77 89  BUN 42* 35*  CREATININE 1.44* 1.47*  CALCIUM 9.7 9.5   Liver Function Tests: Recent Labs  Lab 06/29/18 1719  AST 13*  ALT 9  ALKPHOS 46  BILITOT 0.4  PROT 6.3*  ALBUMIN 3.1*   No results for input(s): LIPASE, AMYLASE in the last 168 hours. No results for input(s): AMMONIA in the last 168 hours. CBC: Recent Labs  Lab 06/29/18 1719 06/30/18 0451  WBC 7.2 7.9  NEUTROABS 4.6  --   HGB 7.8* 7.9*  HCT 26.6* 25.2*  MCV 90.8 87.2  PLT 353 298  Cardiac Enzymes: No results for input(s): CKTOTAL, CKMB, CKMBINDEX, TROPONINI in the last 168 hours. BNP (last 3 results) Recent Labs    06/29/18 1719  BNP 66.1    ProBNP (last 3 results) No results for input(s): PROBNP in the last 8760 hours.  CBG: No results for input(s): GLUCAP in the last 168 hours.  No results found for this or any previous visit (from the past 240 hour(s)).   Studies: Dg Foot Complete Left  Result Date: 06/29/2018 CLINICAL DATA:  83 year old with chronic LEFT foot ulceration which has recently worsened. EXAM: LEFT FOOT - COMPLETE 3+ VIEW COMPARISON:  04/28/2018, 02/23/2018. FINDINGS: Diffuse soft tissue swelling.  Ulceration involving the DORSAL surface overlying the midfoot. Since the examination 2 months ago, osteolysis involving the base of the fifth metatarsal. No evidence of osteomyelitis elsewhere. Severe degenerative changes involving the midfoot. Hammertoe deformities. IMPRESSION: 1. Possible osteomyelitis involving the base of the fifth metatarsal. No evidence of osteomyelitis elsewhere. 2. Severe degenerative changes involving the midfoot. Electronically Signed   By: Evangeline Dakin M.D.   On: 06/29/2018 18:40    Scheduled Meds: . acetaminophen  1,000 mg Oral Daily  . calcium-vitamin D  1 tablet Oral QPM  . diltiazem  120 mg Oral Daily  . doxycycline  100 mg Oral Q12H  . famotidine  40 mg Oral QPM  . gabapentin  200 mg Oral BID  . levothyroxine  175 mcg Oral QAC breakfast  . magnesium oxide  200 mg Oral QPM  . multivitamin with minerals  1 tablet Oral QPM  . nutrition supplement (JUVEN)  1 packet Oral BID BM  . pantoprazole  40 mg Intravenous Q12H  . vitamin C  500 mg Oral QPM   Continuous Infusions:  Principal Problem:   Symptomatic anemia Active Problems:   Hypothyroidism, acquired   Iron deficiency anemia   Essential hypertension   Hypertension   Peripheral vascular disease of lower extremity with ulceration (HCC)   Acute renal failure superimposed on stage 3 chronic kidney disease ([place])    Time spent: 40 min    [facility] Pager [phone] If 7PM-7AM, please contact night-coverage at [email or web address], password [facility] 06/30/2018, 6:55 PM  LOS: 1 day

## 2018-06-30 NOTE — Progress Notes (Signed)
Initial Nutrition Assessment  DOCUMENTATION CODES:   Obesity unspecified  INTERVENTION:   -Continue with calcium with vitamin D daily -Continue vitamin C daily -1 packet Juven BID, each packet provides 80 calories, 8 grams of carbohydrate, and 14 grams of amino acids; supplement contains CaHMB, glutamine, and arginine, to promote wound healing  NUTRITION DIAGNOSIS:   Increased nutrient needs related to wound healing as evidenced by estimated needs.  GOAL:   Patient will meet greater than or equal to 90% of their needs  MONITOR:   PO intake, Supplement acceptance, Labs, Weight trends, Skin, I & O's  REASON FOR ASSESSMENT:   Malnutrition Screening Tool    ASSESSMENT:   Shelby Reese is a 83 y.o. female with medical history significant for hypertension, hypothyroidism, chronic kidney disease stage III, history of hemorrhagic shock with source of bleeding not identified, and severe PAD status post right AKA and with chronic left foot wounds, now presenting to the emergency department for evaluation of fatigue, increased redness and swelling about the left foot wounds, and low hemoglobin on outpatient blood work.   Pt admitted with symptomatic anemia.   8/13- s/p CWOCN consult; pt with chronic lower extremity non healing wounds (lt dorsal foot with necrotic tissue x 2 and lt knee erythema consistent with cellulitis)  Pt sitting in recliner chair at time of visit. Pt did not arise when name was called or during exam.   Pt NPO at time of visit for potential surgery (advanced to Heart Healthy diet after RD visit).  Reviewed wt hx; noted pt has experienced a 20.7% wt loss over the past 6 months, however, suspect at least some wt loss is related to amputation (pt s/p rt AKA on 1/12/26/17).   Pt with increased nutrient needs related to wound healing; RD will order supplements to assist pt with meeting needs and to support wound healing.   Labs reviewed.   NUTRITION - FOCUSED  PHYSICAL EXAM:    Most Recent Value  Orbital Region  No depletion  Upper Arm Region  Mild depletion  Thoracic and Lumbar Region  No depletion  Buccal Region  No depletion  Temple Region  No depletion  Clavicle Bone Region  No depletion  Scapular Bone Region  No depletion  Dorsal Hand  Mild depletion  Patellar Region  No depletion  Anterior Thigh Region  No depletion  Posterior Calf Region  No depletion  Edema (RD Assessment)  Mild  Hair  Reviewed  Eyes  Reviewed  Mouth  Reviewed  Skin  Reviewed  Nails  Reviewed       Diet Order:   Diet Order            Diet Heart Room service appropriate? Yes; Fluid consistency: Thin  Diet effective now              EDUCATION NEEDS:   No education needs have been identified at this time  Skin:  Skin Assessment: Skin Integrity Issues: Skin Integrity Issues:: Other (Comment) Other: lt lower extremity chronic non-healing wounds (lt dorsal foot with necrotic tissue x 2) and lt knee cellulitis  Last BM:  PTA  Height:   Ht Readings from Last 1 Encounters:  03/26/18 5\' 3"  (1.6 m)    Weight:   Wt Readings from Last 1 Encounters:  06/29/18 81.7 kg    Ideal Body Weight:  48.1 kg  BMI:  Body mass index is 31.91 kg/m.  Estimated Nutritional Needs:   Kcal:  1450-1650  Protein:  95-110 grams  Fluid:  > 1.4 L    Shelby Reese Shelby Reese, RD, LDN, CDE Pager: [phone] After hours Pager: [phone]

## 2018-06-30 NOTE — Consult Note (Signed)
   [facility] CM Inpatient Consult   06/30/2018  Shelby Reese 12-15-1934 [id number]   Patient is currently active with [facility] for chronic disease management services.  Patient has been engaged by the [place] Nurse Practitioner and [facility] Social Worker (in the past) on disease management and community resource support.  Met with the patient at the bedside.  Patient states she was sent from her primary care who states her labs were critical.   She states that her right foot is infected and she is suppose to be getting some antibiotics.  She states she wants the surgeons to be aggressive.  She states, "I am tire of all of this amputation stuff."  Made Inpatient Case Manager aware that [facility] following. Of note, [facility] Care Management services does not replace or interfere with any services that are needed or arranged by inpatient case management or social work.  For additional questions or referrals please contact:   Natividad Brood, RN BSN [facility] Liaison  [phone] business mobile phone Toll free office [phone]

## 2018-07-01 ENCOUNTER — Inpatient Hospital Stay (HOSPITAL_COMMUNITY): Payer: Medicare Other

## 2018-07-01 DIAGNOSIS — D500 Iron deficiency anemia secondary to blood loss (chronic): Secondary | ICD-10-CM

## 2018-07-01 DIAGNOSIS — E039 Hypothyroidism, unspecified: Secondary | ICD-10-CM

## 2018-07-01 LAB — BPAM RBC
Blood Product Expiration Date: 201908182359
Blood Product Expiration Date: 201909042359
ISSUE DATE / TIME: 201908122025
ISSUE DATE / TIME: 201908130009
UNIT TYPE AND RH: 6200
Unit Type and Rh: 6200

## 2018-07-01 LAB — TYPE AND SCREEN
ABO/RH(D): A POS
Antibody Screen: NEGATIVE
Unit division: 0
Unit division: 0

## 2018-07-01 LAB — C-REACTIVE PROTEIN: CRP: 3.4 mg/dL — AB (ref <1.0)

## 2018-07-01 MED ORDER — LORAZEPAM 2 MG/ML IJ SOLN
1.0000 mg | Freq: Once | INTRAMUSCULAR | Status: AC
  Administered 2018-07-01: 1 mg via INTRAVENOUS
  Filled 2018-07-01: qty 1

## 2018-07-01 MED ORDER — GI COCKTAIL ~~LOC~~
30.0000 mL | Freq: Once | ORAL | Status: DC
  Filled 2018-07-01: qty 30

## 2018-07-01 MED ORDER — CEFEPIME HCL 1 G IJ SOLR
1.0000 g | INTRAMUSCULAR | Status: DC
  Administered 2018-07-01 – 2018-07-03 (×3): 1 g via INTRAVENOUS
  Filled 2018-07-01 (×4): qty 1

## 2018-07-01 MED ORDER — VANCOMYCIN HCL IN DEXTROSE 750-5 MG/150ML-% IV SOLN
750.0000 mg | INTRAVENOUS | Status: DC
  Administered 2018-07-01 – 2018-07-03 (×3): 750 mg via INTRAVENOUS
  Filled 2018-07-01 (×4): qty 150

## 2018-07-01 MED ORDER — VANCOMYCIN HCL IN DEXTROSE 1-5 GM/200ML-% IV SOLN: 1000.0000 mg | Freq: Two times a day (BID) | INTRAVENOUS | Status: DC

## 2018-07-01 NOTE — Progress Notes (Signed)
[facility] PROGRESS NOTE  Shelby Reese FUX:[id number] DOB: 1935/08/30 DOA: 06/29/2018 PCP: [name], MD  Assessment/Plan:   83 year old female with history of hypertension, hypothyroidism, CKD stage III, PAD A/P right AKA, left lower extremity severe atherosclerosis, atherectomy, stent placement in May 2019, chronic nonhealing ulcers to left lower extremity, recommended amputation, patient refused, history of hemorrhagic shock, GI bleed underwent work-up during previous admission without any obvious source of bleeding presented to the emergency department on 06/29/2018 with severe generalized weakness, increased swelling and pain of the left lower extremity.  Patient was found to have hemoglobin of 7.8 down from 10.2 from June 2019.  Stool occult was positive.  ## Symptomatic anemia of chr disease   - Presents with fatigue and increased redness and swelling around chronic left foot ulcers  - Found to have Hgb of 7.8, down from 10.2 in June; she denies melena or hematochezia, stool reportedly brown in ED but FOBT+  - She had hemorrhagic shock in April but no definite source of bleeding was identified  - She has been using Advil BID for chronic pain and stopped taking her PPI but denies abdominal pain or indigestion; BUN is up, but so is creatinine   - She is currently being transfused RBC in ED  - Start IV PPI q12h for now, check post-transfusion CBC   ## Infected nonhealing wounds of lt ankle -MR ankle to r/o osteo-negative for osteo- -Evaluated by wound care, recommended debridement -Evaluated by wound care, recommended debridement  - consulted ortho Dr. Sharol Given -Get wound cultures -Start the patient on vancomycin, cefepime  ## severe PVD - s/p AKA rt -Atherectomy, stent to left May 2019  ## Acute kidney injury superimposed on CKD III  - SCr is 1.44 on admission, up from 1.02 in June '19  - Hold ACE-i, -Avoid nephrotoxic agents -Improved with IV fluids    ##  Hypertension  - BP at goal  - Continue diltiazem, hold ACE-i until renal function stabilizes   ## Hypothyroidism  - Continue Synthroid   Debility -Involved physical therapy  Code Status:  full Family Communication:  Patient  Disposition Plan:  SNF   Consultants:   ortho  HPI/Subjective:  frustrated ablout the wound, gen weakness  Objective: Vitals:   07/01/18 0457 07/01/18 1415  BP: 139/60 (!) 147/52  Pulse: 85 74  Resp: 16 16  Temp: 99.3 F (37.4 C) 97.6 F (36.4 C)  SpO2: 97% 95%    Intake/Output Summary (Last 24 hours) at 07/01/2018 1428 Last data filed at 07/01/2018 1000 Gross per 24 hour  Intake 240 ml  Output 1600 ml  Net -1360 ml   Filed Weights   06/29/18 2228  Weight: 81.7 kg    Exam:  Constitutional: NAD, calm,  Eyes: PERTLA, lids and conjunctivae normal ENMT: Mucous membranes are moist. Posterior pharynx clear of any exudate or lesions.   Neck: normal, supple, no masses, no thyromegaly Respiratory: clear to auscultation bilaterally, no wheezing, no crackles. Normal respiratory effort.    Cardiovascular: S1 & S2 heard, regular rate and rhythm. No significant JVD. Abdomen: No distension, no tenderness, soft. Bowel sounds normal.  Musculoskeletal: no clubbing / cyanosis. Status-post right AKA.    Skin: Ulcers to dorsal leftankle wiith surrounding erythema, purulant drainge. Neurologic: CN 2-12 grossly intact. Strength 5/5 in all 4 limbs.  Psychiatric: Alert and oriented to person, place, and situation. Calm, cooperative Data Reviewed: Basic Metabolic Panel: Recent Labs  Lab 06/29/18 1719 06/30/18 0451  NA 135 137  K 4.6 4.7  CL 104 106  CO2 22 23  GLUCOSE 77 89  BUN 42* 35*  CREATININE 1.44* 1.47*  CALCIUM 9.7 9.5   Liver Function Tests: Recent Labs  Lab 06/29/18 1719  AST 13*  ALT 9  ALKPHOS 46  BILITOT 0.4  PROT 6.3*  ALBUMIN 3.1*   No results for input(s): LIPASE, AMYLASE in the last 168 hours. No results for input(s):  AMMONIA in the last 168 hours. CBC: Recent Labs  Lab 06/29/18 1719 06/30/18 0451  WBC 7.2 7.9  NEUTROABS 4.6  --   HGB 7.8* 7.9*  HCT 26.6* 25.2*  MCV 90.8 87.2  PLT 353 298   Cardiac Enzymes: No results for input(s): CKTOTAL, CKMB, CKMBINDEX, TROPONINI in the last 168 hours. BNP (last 3 results) Recent Labs    06/29/18 1719  BNP 66.1    ProBNP (last 3 results) No results for input(s): PROBNP in the last 8760 hours.  CBG: No results for input(s): GLUCAP in the last 168 hours.  No results found for this or any previous visit (from the past 240 hour(s)).   Studies: Mr Ankle Left Wo Contrast  Result Date: 07/01/2018 CLINICAL DATA:  Chronic wound of the left foot. Evaluate for osteomyelitis. EXAM: MRI OF THE LEFT ANKLE WITHOUT CONTRAST TECHNIQUE: Multiplanar, multisequence MR imaging of the ankle was performed. No intravenous contrast was administered. COMPARISON:  None. FINDINGS: TENDONS Peroneal: Peroneal longus tendon intact. Peroneal brevis intact. Posteromedial: Posterior tibial tendon intact. Flexor hallucis longus tendon intact. Flexor digitorum longus tendon intact. Anterior: Complete tear of the tibialis anterior tendon. Extensor hallucis longus tendon intact Extensor digitorum longus tendon intact. Achilles:  Mild tendinosis of Achilles tendon without a tear. Plantar Fascia: Intact. LIGAMENTS Lateral: Anterior talofibular ligament intact. Calcaneofibular ligament intact. Posterior talofibular ligament intact. Anterior and posterior tibiofibular ligaments intact. Medial: Deltoid ligament intact. Spring ligament intact. CARTILAGE Ankle Joint: Small ankle joint effusion. High-grade partial-thickness cartilage loss of the tibiotalar joint with subchondral reactive marrow edema at the medial corner of the talar dome. Subtalar Joints/Sinus Tarsi: Normal subtalar joints. No subtalar joint effusion. Normal sinus tarsi. Bones: No periosteal reaction or bone destruction. No acute  fracture or dislocation. Soft Tissue: Soft tissue wound along the dorsal aspect of midfoot. IMPRESSION: 1. Soft tissue wound along the dorsal aspect of the midfoot. No evidence of osteomyelitis of left ankle. 2. Complete tear of the tibialis anterior tendon. 3. Mild-moderate osteoarthritis of the left ankle. 4. Mild tendinosis of Achilles tendon without a tear. Electronically Signed   By: Kathreen Devoid   On: 07/01/2018 10:20   Dg Foot Complete Left  Result Date: 06/29/2018 CLINICAL DATA:  83 year old with chronic LEFT foot ulceration which has recently worsened. EXAM: LEFT FOOT - COMPLETE 3+ VIEW COMPARISON:  04/28/2018, 02/23/2018. FINDINGS: Diffuse soft tissue swelling. Ulceration involving the DORSAL surface overlying the midfoot. Since the examination 2 months ago, osteolysis involving the base of the fifth metatarsal. No evidence of osteomyelitis elsewhere. Severe degenerative changes involving the midfoot. Hammertoe deformities. IMPRESSION: 1. Possible osteomyelitis involving the base of the fifth metatarsal. No evidence of osteomyelitis elsewhere. 2. Severe degenerative changes involving the midfoot. Electronically Signed   By: Evangeline Dakin M.D.   On: 06/29/2018 18:40    Scheduled Meds: . acetaminophen  1,000 mg Oral Daily  . calcium-vitamin D  1 tablet Oral QPM  . diltiazem  120 mg Oral Daily  . doxycycline  100 mg Oral Q12H  . famotidine  40 mg Oral QPM  .  gabapentin  200 mg Oral BID  . levothyroxine  175 mcg Oral QAC breakfast  . magnesium oxide  200 mg Oral QPM  . multivitamin with minerals  1 tablet Oral QPM  . nutrition supplement (JUVEN)  1 packet Oral BID BM  . pantoprazole  40 mg Intravenous Q12H  . vitamin C  500 mg Oral QPM   Continuous Infusions:  Principal Problem:   Symptomatic anemia Active Problems:   Hypothyroidism, acquired   Iron deficiency anemia   Essential hypertension   Hypertension   Peripheral vascular disease of lower extremity with ulceration (HCC)    Acute renal failure superimposed on stage 3 chronic kidney disease ([place])    Time spent: 40 min    [facility] Pager [phone] If 7PM-7AM, please contact night-coverage at [email or web address], password [facility] 07/01/2018, 2:28 PM  LOS: 2 days

## 2018-07-01 NOTE — Consult Note (Signed)
[facility] Nurse wound follow up Received a consult for woundcare.  I evaluated the patient 06/30/18 and placed orders in the chart for wound care.  I just spoke via phone with Mickel Baas, the primary care RN for the patient today.  We are awaiting a surgical evaluation and decision.  Until the decisions are made for or against surgical intervention, there are wound care orders in the chart.  No new needs identified at this time. Monitor the wound area(s) for worsening of condition such as: Signs/symptoms of infection,  Increase in size,  Development of or worsening of odor, Development of pain, or increased pain at the affected locations.  Notify the medical team if any of these develop.  Thank you for the consult.  Discussed plan of care with the patient and bedside nurse.  [place] nurse will not follow at this time.  Please re-consult the [facility] team if needed.  Val Riles, RN, MSN, CWOCN, CNS-BC, pager [phone]

## 2018-07-01 NOTE — Progress Notes (Signed)
Transport came to obtain patient for scan. Patient refused stating she's very anxious and doesn't understand how she doesn't have an order for something for anxiety and refused to go to the scan. I text the Dr on call to see if something could be ordered for her anxiety.

## 2018-07-01 NOTE — Progress Notes (Signed)
Pharmacy Antibiotic Note  Shelby Reese is a 83 y.o. female admitted on 06/29/2018 with cellulitis/nonhealing wounds.  Pharmacy has been consulted for Vancomycin / Cefepime  dosing.  Plan: Cefepime 1 gram iv Q 24 hours Vancomycin 750 mg iv Q 24 hours Follow up plan, Scr, cultures   Weight: 180 lb 1.9 oz (81.7 kg)  Temp (24hrs), Avg:98.6 F (37 C), Min:97.6 F (36.4 C), Max:99.3 F (37.4 C)  Recent Labs  Lab 06/29/18 1719 06/30/18 0451  WBC 7.2 7.9  CREATININE 1.44* 1.47*    Estimated Creatinine Clearance: 30.9 mL/min (A) (by C-G formula based on SCr of 1.47 mg/dL (H)).    Allergies  Allergen Reactions  . Cinnamon Hives  . Ciprofloxacin Other (See Comments)    TREMORS  . Diovan [Valsartan] Other (See Comments)    Extreme vertigo  . Food Diarrhea and Other (See Comments)    ORANGE JUICE   UPSET STOMACH  . Latex Rash and Other (See Comments)    Rash/inflammation due to exposure  . Nitrofuran Derivatives Hives and Rash    "Full body rash"  . Penicillins Hives and Swelling    *tolerated Ceftriaxone September 2018 Has patient had a PCN reaction causing immediate rash, facial/tongue/throat swelling, SOB or lightheadedness with hypotension:No--severe irritation at the injection site Has patient had a PCN reaction causing severe rash involving mucus membranes or skin necrosis:Unknown Has patient had a PCN reaction that required hospitalization:No Has patient had a PCN reaction occurring within the last 10 years:Yes If all of the above answers are "NO", then may proceed with  . Bactrim [Sulfamethoxazole-Trimethoprim] Diarrhea and Nausea Only  . Other Rash    Mycins, Strawberry, Oranges  . Sulfa Antibiotics Diarrhea and Nausea Only      Thank you Anette Guarneri, PharmD [phone]  07/01/2018 3:08 PM

## 2018-07-02 DIAGNOSIS — I739 Peripheral vascular disease, unspecified: Secondary | ICD-10-CM

## 2018-07-02 DIAGNOSIS — L97909 Non-pressure chronic ulcer of unspecified part of unspecified lower leg with unspecified severity: Secondary | ICD-10-CM

## 2018-07-02 DIAGNOSIS — I96 Gangrene, not elsewhere classified: Secondary | ICD-10-CM

## 2018-07-02 DIAGNOSIS — E440 Moderate protein-calorie malnutrition: Secondary | ICD-10-CM

## 2018-07-02 DIAGNOSIS — I70202 Unspecified atherosclerosis of native arteries of extremities, left leg: Secondary | ICD-10-CM

## 2018-07-02 LAB — CBC WITH DIFFERENTIAL/PLATELET
ABS IMMATURE GRANULOCYTES: 0 10*3/uL (ref 0.0–0.1)
BASOS ABS: 0 10*3/uL (ref 0.0–0.1)
Basophils Relative: 1 %
Eosinophils Absolute: 0.7 10*3/uL (ref 0.0–0.7)
Eosinophils Relative: 10 %
HCT: 27.6 % — ABNORMAL LOW (ref 36.0–46.0)
HEMOGLOBIN: 8.3 g/dL — AB (ref 12.0–15.0)
Immature Granulocytes: 0 %
Lymphocytes Relative: 19 %
Lymphs Abs: 1.4 10*3/uL (ref 0.7–4.0)
MCH: 27.2 pg (ref 26.0–34.0)
MCHC: 30.1 g/dL (ref 30.0–36.0)
MCV: 90.5 fL (ref 78.0–100.0)
MONO ABS: 0.7 10*3/uL (ref 0.1–1.0)
Monocytes Relative: 10 %
NEUTROS ABS: 4.2 10*3/uL (ref 1.7–7.7)
Neutrophils Relative %: 60 %
Platelets: 308 10*3/uL (ref 150–400)
RBC: 3.05 MIL/uL — ABNORMAL LOW (ref 3.87–5.11)
RDW: 16.3 % — ABNORMAL HIGH (ref 11.5–15.5)
WBC: 7 10*3/uL (ref 4.0–10.5)

## 2018-07-02 LAB — BASIC METABOLIC PANEL
Anion gap: 5 (ref 5–15)
BUN: 34 mg/dL — AB (ref 8–23)
CHLORIDE: 107 mmol/L (ref 98–111)
CO2: 25 mmol/L (ref 22–32)
Calcium: 9.5 mg/dL (ref 8.9–10.3)
Creatinine, Ser: 1.23 mg/dL — ABNORMAL HIGH (ref 0.44–1.00)
GFR calc Af Amer: 47 mL/min — ABNORMAL LOW (ref >60)
GFR calc non Af Amer: 40 mL/min — ABNORMAL LOW (ref >60)
Glucose, Bld: 83 mg/dL (ref 70–99)
Potassium: 4.7 mmol/L (ref 3.5–5.1)
SODIUM: 137 mmol/L (ref 135–145)

## 2018-07-02 MED ORDER — MORPHINE SULFATE (PF) 2 MG/ML IV SOLN
2.0000 mg | Freq: Once | INTRAVENOUS | Status: AC
  Administered 2018-07-02: 2 mg via INTRAVENOUS
  Filled 2018-07-02: qty 1

## 2018-07-02 MED ORDER — PANTOPRAZOLE SODIUM 40 MG PO TBEC
40.0000 mg | DELAYED_RELEASE_TABLET | Freq: Two times a day (BID) | ORAL | Status: DC
  Administered 2018-07-02 – 2018-07-04 (×4): 40 mg via ORAL
  Filled 2018-07-02 (×4): qty 1

## 2018-07-02 NOTE — Progress Notes (Signed)
[facility] PROGRESS NOTE  Shelby Reese RKY:[id number] DOB: 04-16-1935 DOA: 06/29/2018 PCP: [name], MD  Assessment/Plan:   83 year old female with history of hypertension, hypothyroidism, CKD stage III, PAD A/P right AKA, left lower extremity severe atherosclerosis, atherectomy, stent placement in May 2019, chronic nonhealing ulcers to left lower extremity, recommended amputation, patient refused, history of hemorrhagic shock, GI bleed underwent work-up during previous admission without any obvious source of bleeding presented to the emergency department on 06/29/2018 with severe generalized weakness, increased swelling and pain of the left lower extremity.  Patient was found to have hemoglobin of 7.8 down from 10.2 from June 2019.  Stool occult was positive.  ## Symptomatic anemia of chr disease   - Presents with fatigue and increased redness and swelling around chronic left foot ulcers  - Found to have Hgb of 7.8, down from 10.2 in June; she denies melena or hematochezia, stool reportedly brown in ED but FOBT+  - She had hemorrhagic shock in April but no definite source of bleeding was identified  - She has been using Advil BID for chronic pain and stopped taking her PPI but denies abdominal pain or indigestion; BUN is up, but so is creatinine   - Received 2 units of packed RBC in ER- Discontinue IV PPI q12h for now, check post-transfusion CBC   -iron profile consistent with anemia of chronic disease with the normal iron level, low iron binding capacity -Transfuse as needed hemoglobin less than 7  ## Infected nonhealing wounds of lt ankle -MR ankle to r/o osteo-negative for osteomyelitis -Evaluated by wound care, recommended debridement  - consulted ortho Dr. Geralyn Corwin amputation -Patient wants second opinion.  Consulted orthopedic surgery on call -Get wound cultures-moderate amount of gram-positive cocci, gram-negative rods - on vancomycin, cefepime  ## severe PVD -  s/p AKA rt -Atherectomy, stent to left May 2019  ## Acute kidney injury superimposed on CKD III  - SCr is 1.44 on admission, up from 1.02 in June '19  - Hold ACE-i, -Avoid nephrotoxic agents -Improved with IV fluids    ## Hypertension  - BP at goal  - Continue diltiazem, hold ACE-i until renal function stabilizes   ## Hypothyroidism  - Continue Synthroid   Debility -Involved physical therapy  Code Status:  full Family Communication:  Patient  Disposition Plan:  SNF   Consultants:   ortho  HPI/Subjective: Patient is upset this morning about the recommendations for amputation.  Wants second opinion  Objective: Vitals:   07/02/18 0544 07/02/18 1410  BP: (!) 146/62 (!) 115/44  Pulse: 79 79  Resp: 18 16  Temp: 98.5 F (36.9 C) 99.1 F (37.3 C)  SpO2: 96% 92%    Intake/Output Summary (Last 24 hours) at 07/02/2018 1545 Last data filed at 07/02/2018 1100 Gross per 24 hour  Intake 485.72 ml  Output 1600 ml  Net -1114.28 ml   Filed Weights   06/29/18 2228  Weight: 81.7 kg    Exam:  Constitutional: NAD, calm,  Eyes: PERTLA, lids and conjunctivae normal ENMT: Mucous membranes are moist. Posterior pharynx clear of any exudate or lesions.   Neck: normal, supple, no masses, no thyromegaly Respiratory: clear to auscultation bilaterally, no wheezing, no crackles. Normal respiratory effort.    Cardiovascular: S1 & S2 heard, regular rate and rhythm. No significant JVD. Abdomen: No distension, no tenderness, soft. Bowel sounds normal.  Musculoskeletal: no clubbing / cyanosis. Status-post right AKA.    Skin: Ulcers to dorsal leftankle wiith surrounding erythema, purulant drainge. Neurologic:  CN 2-12 grossly intact. Strength 5/5 in all 4 limbs.  Psychiatric: Alert and oriented to person, place, and situation. Calm, cooperative Data Reviewed: Basic Metabolic Panel: Recent Labs  Lab 06/29/18 1719 06/30/18 0451 07/02/18 0443  NA 135 137 137  K 4.6 4.7 4.7  CL  104 106 107  CO2 22 23 25   GLUCOSE 77 89 83  BUN 42* 35* 34*  CREATININE 1.44* 1.47* 1.23*  CALCIUM 9.7 9.5 9.5   Liver Function Tests: Recent Labs  Lab 06/29/18 1719  AST 13*  ALT 9  ALKPHOS 46  BILITOT 0.4  PROT 6.3*  ALBUMIN 3.1*   No results for input(s): LIPASE, AMYLASE in the last 168 hours. No results for input(s): AMMONIA in the last 168 hours. CBC: Recent Labs  Lab 06/29/18 1719 06/30/18 0451 07/02/18 0443  WBC 7.2 7.9 7.0  NEUTROABS 4.6  --  4.2  HGB 7.8* 7.9* 8.3*  HCT 26.6* 25.2* 27.6*  MCV 90.8 87.2 90.5  PLT 353 298 308   Cardiac Enzymes: No results for input(s): CKTOTAL, CKMB, CKMBINDEX, TROPONINI in the last 168 hours. BNP (last 3 results) Recent Labs    06/29/18 1719  BNP 66.1    ProBNP (last 3 results) No results for input(s): PROBNP in the last 8760 hours.  CBG: No results for input(s): GLUCAP in the last 168 hours.  Recent Results (from the past 240 hour(s))  Aerobic Culture (superficial specimen)     Status: None (Preliminary result)   Collection Time: 07/01/18  4:17 PM  Result Value Ref Range Status   Specimen Description WOUND ANKLE  Final   Special Requests NONE  Final   Gram Stain   Final    RARE WBC PRESENT, PREDOMINANTLY PMN MODERATE GRAM POSITIVE COCCI MODERATE GRAM NEGATIVE RODS    Culture   Final    ABUNDANT PSEUDOMONAS AERUGINOSA SUSCEPTIBILITIES TO FOLLOW CULTURE REINCUBATED FOR BETTER GROWTH Performed at [facility], [place] [address], [place], [place] [address]    Report Status PENDING  Incomplete     Studies: Mr Ankle Left Wo Contrast  Result Date: 07/01/2018 CLINICAL DATA:  Chronic wound of the left foot. Evaluate for osteomyelitis. EXAM: MRI OF THE LEFT ANKLE WITHOUT CONTRAST TECHNIQUE: Multiplanar, multisequence MR imaging of the ankle was performed. No intravenous contrast was administered. COMPARISON:  None. FINDINGS: TENDONS Peroneal: Peroneal longus tendon intact. Peroneal brevis intact.  Posteromedial: Posterior tibial tendon intact. Flexor hallucis longus tendon intact. Flexor digitorum longus tendon intact. Anterior: Complete tear of the tibialis anterior tendon. Extensor hallucis longus tendon intact Extensor digitorum longus tendon intact. Achilles:  Mild tendinosis of Achilles tendon without a tear. Plantar Fascia: Intact. LIGAMENTS Lateral: Anterior talofibular ligament intact. Calcaneofibular ligament intact. Posterior talofibular ligament intact. Anterior and posterior tibiofibular ligaments intact. Medial: Deltoid ligament intact. Spring ligament intact. CARTILAGE Ankle Joint: Small ankle joint effusion. High-grade partial-thickness cartilage loss of the tibiotalar joint with subchondral reactive marrow edema at the medial corner of the talar dome. Subtalar Joints/Sinus Tarsi: Normal subtalar joints. No subtalar joint effusion. Normal sinus tarsi. Bones: No periosteal reaction or bone destruction. No acute fracture or dislocation. Soft Tissue: Soft tissue wound along the dorsal aspect of midfoot. IMPRESSION: 1. Soft tissue wound along the dorsal aspect of the midfoot. No evidence of osteomyelitis of left ankle. 2. Complete tear of the tibialis anterior tendon. 3. Mild-moderate osteoarthritis of the left ankle. 4. Mild tendinosis of Achilles tendon without a tear. Electronically Signed   By: Kathreen Devoid  On: 07/01/2018 10:20    Scheduled Meds: . acetaminophen  1,000 mg Oral Daily  . calcium-vitamin D  1 tablet Oral QPM  . diltiazem  120 mg Oral Daily  . famotidine  40 mg Oral QPM  . gabapentin  200 mg Oral BID  . levothyroxine  175 mcg Oral QAC breakfast  . magnesium oxide  200 mg Oral QPM  . multivitamin with minerals  1 tablet Oral QPM  . nutrition supplement (JUVEN)  1 packet Oral BID BM  . pantoprazole  40 mg Oral BID  . vitamin C  500 mg Oral QPM   Continuous Infusions: . ceFEPime (MAXIPIME) IV 1 g (07/01/18 1714)  . 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 If 7PM-7AM, please contact night-coverage at [email or web address], password [facility] 07/02/2018, 3:45 PM  LOS: 3 days

## 2018-07-02 NOTE — Progress Notes (Signed)
Chaplain presented to the patient's room, Chaplain introduction made. The patient was watching a "religious" program at this time and preferred to continue watching it, rather than have visitation at this time. Chaplain's will follow up with the patient as needed. Chaplain Yaakov Guthrie [phone]

## 2018-07-02 NOTE — Consult Note (Signed)
   [facility] CM Inpatient Consult   07/02/2018  Shelby Reese 02-02-1935 [id number]   Chart review reveals patient has decided not to have a surgical intervention.  Reviewed PT notes and Orthopedic notes that she is ready to die.  Patient has a spiritual care consult and feel she could benefit from a Palliative consult. Met with inpatient RNCM covering regarding needs and [facility] ongoing follow up with transitional needs. [facility] NP-G aware of needs.  For questions, please contact:  Natividad Brood, RN BSN [facility] Liaison  [phone] business mobile phone Toll free office [phone]

## 2018-07-02 NOTE — Consult Note (Signed)
ORTHOPAEDIC CONSULTATION  REQUESTING PHYSICIAN: Monica Becton, MD  Chief Complaint: Foul-smelling necrotic wound left foot  HPI: Shelby Reese is a 83 y.o. female who presents with gangrenous ulceration to her left foot.  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  Past Medical History:  Diagnosis Date  . Allergy   . Anemia   . CKD (chronic kidney disease), stage II   . Fibromyalgia   . GERD (gastroesophageal reflux disease)   . Hiatal hernia   . Hypertension   . Hypothyroid   . Lymphedema    venous insufficency  . Osteoarthritis   . Peripheral vascular disease ([place])   . Ulcer of knee ([place])    right  . Urinary incontinence   . Varicose veins   . Venous insufficiency    Past Surgical History:  Procedure Laterality Date  . AMPUTATION Right 12/15/2017   Procedure: AMPUTATION ABOVE KNEE, RIGHT;  Surgeon: Elam Dutch, MD;  Location: [place];  Service: Vascular;  Laterality: Right;  . APPLICATION OF WOUND VAC Right 07/18/2017   Procedure: APPLICATION OF WOUND VAC;  Surgeon: [name], MD;  Location: [place];  Service: Orthopedics;  Laterality: Right;  . CATARACT EXTRACTION W/ INTRAOCULAR LENS  IMPLANT, BILATERAL    . COLONOSCOPY    . COLONOSCOPY WITH PROPOFOL N/A 03/12/2018   Procedure: COLONOSCOPY WITH PROPOFOL;  Surgeon: Clarene Essex, MD;  Location: [place];  Service: Endoscopy;  Laterality: N/A;  . ESOPHAGOGASTRODUODENOSCOPY Left 03/09/2018   Procedure: ESOPHAGOGASTRODUODENOSCOPY (EGD);  Surgeon: Ronnette Juniper, MD;  Location: [place];  Service: Gastroenterology;  Laterality: Left;  . I&D EXTREMITY Right 07/18/2017   Procedure: IRRIGATION AND DEBRIDEMENT RIGHT KNEE;  Surgeon: [name], MD;  Location: [place];  Service: Orthopedics;  Laterality: Right;  . I&D EXTREMITY Right 07/23/2017   Procedure: REPEAT IRRIGATION AND DEBRIDEMENT RIGHT KNEE;  Surgeon: [name], MD;  Location: [place];   Service: Orthopedics;  Laterality: Right;  . I&D EXTREMITY Right 11/28/2017   Procedure: IRRIGATION AND DEBRIDEMENT RIGHT KNEE , APPLY INSTILLATION VAC;  Surgeon: [name], MD;  Location: [place];  Service: Orthopedics;  Laterality: Right;  . I&D EXTREMITY Right 12/03/2017   Procedure: REPEAT IRRIGATION AND DEBRIDEMENT RIGHT KNEE AND APPLICATION OF A WOUND VAC.;  Surgeon: [name], MD;  Location: [place];  Service: Orthopedics;  Laterality: Right;  . KNEE ARTHROSCOPY Left    menisectomy  . LOWER EXTREMITY ANGIOGRAPHY N/A 03/03/2018   Procedure: LOWER EXTREMITY ANGIOGRAPHY;  Surgeon: Serafina Mitchell, MD;  Location: [place] [facility];  Service: Cardiovascular;  Laterality: N/A;  . MULTIPLE TOOTH EXTRACTIONS    . PERIPHERAL VASCULAR ATHERECTOMY Left 03/03/2018   Procedure: PERIPHERAL VASCULAR ATHERECTOMY;  Surgeon: Serafina Mitchell, MD;  Location: [place] [facility];  Service: Cardiovascular;  Laterality: Left;  common femoral and left SFA  . PERIPHERAL VASCULAR INTERVENTION  03/03/2018   Procedure: PERIPHERAL VASCULAR INTERVENTION;  Surgeon: Serafina Mitchell, MD;  Location: [place] [facility];  Service: Cardiovascular;;  left SFA  . SKIN SPLIT GRAFT Right 07/25/2017   Procedure: Repeat Irrigation and Debridement Right Knee, Split Thickness Skin Graft;  Surgeon: [name], MD;  Location: [place];  Service: Orthopedics;  Laterality: Right;  . SKIN SPLIT GRAFT Right 12/05/2017   Procedure: SKIN GRAFT SPLIT THICKNESS WOUND KNEE, APPLY VAC;  Surgeon: [name], MD;  Location: [place];  Service: Orthopedics;  Laterality: Right;  .  TONSILLECTOMY     Social History   Socioeconomic History  . Marital status: Married    Spouse name: Not on file  . Number of children: 0  . Years of education: Not on file  . Highest education level: Not on file  Occupational History    Comment: retired Marine scientist.   Social Needs  . Financial resource strain: Not on file  . Food insecurity:    Worry: Not on file     Inability: Not on file  . Transportation needs:    Medical: Not on file    Non-medical: Not on file  Tobacco Use  . Smoking status: Former Smoker    Packs/day: 0.50    Years: 10.00    Pack years: 5.00    Last attempt to quit: 11/19/1979    Years since quitting: 38.6  . Smokeless tobacco: Never Used  Substance and Sexual Activity  . Alcohol use: Yes    Alcohol/week: 14.0 standard drinks    Types: 14 Glasses of wine per week    Comment: 2 glasses of wine with dinner  . Drug use: No  . Sexual activity: Not on file  Lifestyle  . Physical activity:    Days per week: Not on file    Minutes per session: Not on file  . Stress: Not on file  Relationships  . Social connections:    Talks on phone: Not on file    Gets together: Not on file    Attends religious service: Not on file    Active member of club or organization: Not on file    Attends meetings of clubs or organizations: Not on file    Relationship status: Not on file  Other Topics Concern  . Not on file  Social History Narrative  . Not on file   Family History  Problem Relation Age of Onset  . Stroke Mother   . Varicose Veins Mother   . Cancer Father        prostate  . Stroke Sister   . Heart disease Sister   . Varicose Veins Sister   . Stroke Maternal Grandmother   . Varicose Veins Sister    - negative except otherwise stated in the family history section Allergies  Allergen Reactions  . Cinnamon Hives  . Ciprofloxacin Other (See Comments)    TREMORS  . Diovan [Valsartan] Other (See Comments)    Extreme vertigo  . Food Diarrhea and Other (See Comments)    ORANGE JUICE   UPSET STOMACH  . Latex Rash and Other (See Comments)    Rash/inflammation due to exposure  . Nitrofuran Derivatives Hives and Rash    "Full body rash"  . Penicillins Hives and Swelling    *tolerated Ceftriaxone September 2018 Has patient had a PCN reaction causing immediate rash, facial/tongue/throat swelling, SOB or lightheadedness  with hypotension:No--severe irritation at the injection site Has patient had a PCN reaction causing severe rash involving mucus membranes or skin necrosis:Unknown Has patient had a PCN reaction that required hospitalization:No Has patient had a PCN reaction occurring within the last 10 years:Yes If all of the above answers are "NO", then may proceed with  . Bactrim [Sulfamethoxazole-Trimethoprim] Diarrhea and Nausea Only  . Other Rash    Mycins, Strawberry, Oranges  . Sulfa Antibiotics Diarrhea and Nausea Only   Prior to Admission medications   Medication Sig Start Date End Date Taking? Authorizing Provider  acetaminophen (TYLENOL) 500 MG tablet Take 1,000 mg by mouth daily.  Yes [provider]  aspirin EC 81 MG tablet Take 81 mg by mouth every evening.    Yes [provider]  benazepril (LOTENSIN) 40 MG tablet Take 40 mg by mouth daily.   Yes [provider]  Calcium Carbonate-Vitamin D (CALCIUM 600+D) 600-400 MG-UNIT per tablet Take 1 tablet by mouth every evening.    Yes [provider]  cetirizine (ZYRTEC) 10 MG tablet Take 10 mg by mouth at bedtime.    Yes [provider]  diltiazem (TIAZAC) 120 MG 24 hr capsule HOLD until seen by your PCP Patient taking differently: Take 120 mg by mouth daily.  03/18/18  Yes Oretha Milch D, MD  diphenhydrAMINE (BENADRYL) 25 mg capsule Take 25 mg by mouth 3 (three) times daily.    Yes [provider]  famotidine (PEPCID) 40 MG tablet Take 40 mg by mouth every evening.    Yes [provider]  ferrous sulfate 325 (65 FE) MG tablet Take 487.5 mg by mouth every evening.    Yes [provider]  FIBER PO Take 1 capsule by mouth 2 (two) times daily.   Yes [provider]  gabapentin (NEURONTIN) 100 MG capsule Take 200 mg by mouth 2 (two) times daily.    Yes [provider]  ibuprofen (ADVIL,MOTRIN) 200 MG tablet Take 400 mg by mouth daily.   Yes [provider]    levothyroxine (SYNTHROID, LEVOTHROID) 175 MCG tablet Take 175 mcg by mouth daily before breakfast.   Yes [provider]  Magnesium 125 MG CAPS Take 125 mg by mouth every evening.    Yes [provider]  Multiple Vitamins-Minerals (MULTIVITAMIN WITH MINERALS) tablet Take 1 tablet by mouth every evening.    Yes [provider]  nystatin-triamcinolone (MYCOLOG II) cream Apply 1 application topically See admin instructions. Apply topically to left leg every afternoon for wound care   Yes [provider]  vitamin C (ASCORBIC ACID) 500 MG tablet Take 500 mg by mouth every evening.    Yes [provider]  glucosamine-chondroitin 500-400 MG tablet Take 1 tablet by mouth every evening.     [provider]  oxyCODONE-acetaminophen (PERCOCET) 10-325 MG tablet Take 1 tablet by mouth every 4 (four) hours as needed for pain. Patient not taking: Reported on 06/29/2018 04/29/18   Dalia Heading, PA-C  pantoprazole (PROTONIX) 40 MG tablet Take 1 tablet (40 mg total) by mouth daily. Patient not taking: Reported on 06/29/2018 12/24/17   Bonnell Public, MD   Mr Ankle Left Wo Contrast  Result Date: 07/01/2018 CLINICAL DATA:  Chronic wound of the left foot. Evaluate for osteomyelitis. EXAM: MRI OF THE LEFT ANKLE WITHOUT CONTRAST TECHNIQUE: Multiplanar, multisequence MR imaging of the ankle was performed. No intravenous contrast was administered. COMPARISON:  None. FINDINGS: TENDONS Peroneal: Peroneal longus tendon intact. Peroneal brevis intact. Posteromedial: Posterior tibial tendon intact. Flexor hallucis longus tendon intact. Flexor digitorum longus tendon intact. Anterior: Complete tear of the tibialis anterior tendon. Extensor hallucis longus tendon intact Extensor digitorum longus tendon intact. Achilles:  Mild tendinosis of Achilles tendon without a tear. Plantar Fascia: Intact. LIGAMENTS Lateral: Anterior talofibular ligament intact. Calcaneofibular  ligament intact. Posterior talofibular ligament intact. Anterior and posterior tibiofibular ligaments intact. Medial: Deltoid ligament intact. Spring ligament intact. CARTILAGE Ankle Joint: Small ankle joint effusion. High-grade partial-thickness cartilage loss of the tibiotalar joint with subchondral reactive marrow edema at the medial corner of the talar dome. Subtalar Joints/Sinus Tarsi: Normal subtalar joints. No subtalar joint effusion.  Normal sinus tarsi. Bones: No periosteal reaction or bone destruction. No acute fracture or dislocation. Soft Tissue: Soft tissue wound along the dorsal aspect of midfoot. IMPRESSION: 1. Soft tissue wound along the dorsal aspect of the midfoot. No evidence of osteomyelitis of left ankle. 2. Complete tear of the tibialis anterior tendon. 3. Mild-moderate osteoarthritis of the left ankle. 4. Mild tendinosis of Achilles tendon without a tear. Electronically Signed   By: Kathreen Devoid   On: 07/01/2018 10:20   - pertinent xrays, CT, MRI studies were reviewed and independently interpreted  Positive ROS: All other systems have been reviewed and were otherwise negative with the exception of those mentioned in the HPI and as above.  Physical Exam: General: Alert, no acute distress Psychiatric: Patient is competent for consent with normal mood and affect Lymphatic: No axillary or cervical lymphadenopathy Cardiovascular: No pedal edema Respiratory: No cyanosis, no use of accessory musculature GI: No organomegaly, abdomen is soft and non-tender    Images:  @ENCIMAGES @  Labs:  Lab Results  Component Value Date   HGBA1C 5.6 12/12/2017   ESRSEDRATE 62 (H) 06/30/2018   ESRSEDRATE 58 (H) 06/30/2018   ESRSEDRATE 104 (H) 12/12/2017   CRP 3.4 (H) 07/01/2018   CRP 1.8 (H) 06/30/2018   CRP 22.9 (H) 12/12/2017   REPTSTATUS PENDING 07/01/2018   GRAMSTAIN  07/01/2018    RARE WBC PRESENT, PREDOMINANTLY PMN MODERATE GRAM POSITIVE COCCI MODERATE GRAM NEGATIVE  RODS Performed at [facility], [place] [address], [place], [place] [address]    CULT PENDING 07/01/2018   LABORGA PSEUDOMONAS AERUGINOSA (A) 03/26/2018   LABORGA ENTEROCOCCUS FAECIUM (A) 03/26/2018    Lab Results  Component Value Date   ALBUMIN 3.1 (L) 06/29/2018   ALBUMIN 2.9 (L) 03/26/2018   ALBUMIN 2.5 (L) 03/13/2018   PREALBUMIN 6.8 (L) 12/12/2017    Neurologic: Patient does not have protective sensation bilateral lower extremities.   MUSCULOSKELETAL:   Skin: Examination patient has a well-healed right above-the-knee amputation left foot shows a massive necrotic foul-smelling ulcer on the dorsum of the left foot with exposed tendon and bone.  Patient does not have ascending cellulitis she can wiggle her toes but she has a massive necrotic wound over the dorsum of the foot.  Assessment: Assessment: Severe peripheral vascular disease left lower extremity status post right above-the-knee amputation with ulceration necrotic wet gangrenous ulcer involving the left foot and left ankle.  Plan: Plan: Discussed with the patient her only option is an amputation due to the extensive necrotic nature of her foot and ankle.  With an above-knee amputation of the right she will require an above-the-knee amputation on the left.  Discussed with the patient her only option is to proceeding with amputation and patient states that she will not allow an amputation.  I discussed that the infection in her foot and ankle could kill her and patient states "good".  I discussed with the patient that if she changes her mind we could proceed with surgery tomorrow or next week.  Patient has a life and limb threatening gangrenous infection of the left foot and ankle.  Thank you for the consult and the opportunity to see Ms. Merry Proud, [place] [phone] 7:53 AM

## 2018-07-02 NOTE — Care Management Note (Signed)
Case Management Note  Patient Details  Name: ANIJA BRICKNER MRN: [id number] Date of Birth: 29-Mar-1935  Subjective/Objective:  Pt presented for Left foot redness and swelling- severe PVD, fatigue with low hemoglobin. S/p Right AKA and is refusing Left AKA. CM did place Palliative Care Consult for Goals of Care in Epic. Patient is refusing Therapy to work with her.                  Action/Plan: Pt is currently active with [facility] for Chronic Disease Management Services. CM did discuss with [place] in regards to Palliative Consult being placed. CM will continue to monitor for additional needs.   Expected Discharge Date:                  Expected Discharge Plan:  [place]  In-House Referral:     Discharge planning Services  CM Consult  Post Acute Care Choice:    Choice offered to:     DME Arranged:    DME Agency:     HH Arranged:    [place] Agency:     Status of Service:  In process, will continue to follow  If discussed at Long Length of Stay Meetings, dates discussed:    Additional Comments:  Bethena Roys, RN 07/02/2018, 2:36 PM

## 2018-07-02 NOTE — Progress Notes (Signed)
OT Cancellation Note  Patient Details Name: Shelby Reese MRN: [id number] DOB: 1935-06-07   Cancelled Treatment:    Reason Eval/Treat Not Completed: OT screened, no needs identified, will sign off.  Patient feels she is at baseline with ADLs, and has caregiver who is assist 6 days/week (husband provides assist when caregiver is not available) who assists with ADLs.  She declines further OT services at this time, states she does not wish to proceed with L AKA.  OT signing off.  If any further needs arise, please re-consult.   Delight Stare, OTR/L  Pager [place] 07/02/2018, 1:54 PM

## 2018-07-02 NOTE — Progress Notes (Signed)
PT Cancellation Note  Patient Details Name: Shelby Reese MRN: [id number] DOB: 05/03/1935   Cancelled Treatment:    Reason Eval/Treat Not Completed: PT screened, no needs identified, will sign off(Spoke with pt and her private aide Regino Schultze extensively about mobility. Pt has an aide 7 days/week at home and uses transfer board to get into WC. Aide assists with bathing/dressing. Pt/aide state pt is at baseline with mobility and no PT is needed at present. ) Pt was up in recliner in her room, pt reports she was able to transfer to recliner with sliding board without assistance this morning. Pt has needed DME at home (WC, ramp, drop arm commode). Pt declines further PT as she is at baseline with mobility. Pt stated she does not wish to proceed with L AKA and feels she is ready to die. PT will sign off.    Blondell Reveal K[place] 07/02/2018, 9:50 AM [phone]

## 2018-07-02 NOTE — Consult Note (Signed)
Reason for Consult:Foot ulcer Referring Physician: Wynonia Reese is an 83 y.o. female.  HPI: Shelby Reese has been suffering with a non-healing ulcer on her remaining foot since early this year.  Past Medical History:  Diagnosis Date  . Allergy   . Anemia   . CKD (chronic kidney disease), stage II   . Fibromyalgia   . GERD (gastroesophageal reflux disease)   . Hiatal hernia   . Hypertension   . Hypothyroid   . Lymphedema    venous insufficency  . Osteoarthritis   . Peripheral vascular disease ([place])   . Ulcer of knee ([place])    right  . Urinary incontinence   . Varicose veins   . Venous insufficiency     Past Surgical History:  Procedure Laterality Date  . AMPUTATION Right 12/15/2017   Procedure: AMPUTATION ABOVE KNEE, RIGHT;  Surgeon: Elam Dutch, MD;  Location: [facility];  Service: Vascular;  Laterality: Right;  . APPLICATION OF WOUND VAC Right 07/18/2017   Procedure: APPLICATION OF WOUND VAC;  Surgeon: [name], MD;  Location: [facility];  Service: Orthopedics;  Laterality: Right;  . CATARACT EXTRACTION W/ INTRAOCULAR LENS  IMPLANT, BILATERAL    . COLONOSCOPY    . COLONOSCOPY WITH PROPOFOL N/A 03/12/2018   Procedure: COLONOSCOPY WITH PROPOFOL;  Surgeon: Clarene Essex, MD;  Location: [facility];  Service: Endoscopy;  Laterality: N/A;  . ESOPHAGOGASTRODUODENOSCOPY Left 03/09/2018   Procedure: ESOPHAGOGASTRODUODENOSCOPY (EGD);  Surgeon: Ronnette Juniper, MD;  Location: [facility];  Service: Gastroenterology;  Laterality: Left;  . I&D EXTREMITY Right 07/18/2017   Procedure: IRRIGATION AND DEBRIDEMENT RIGHT KNEE;  Surgeon: [name], MD;  Location: [facility];  Service: Orthopedics;  Laterality: Right;  . I&D EXTREMITY Right 07/23/2017   Procedure: REPEAT IRRIGATION AND DEBRIDEMENT RIGHT KNEE;  Surgeon: [name], MD;  Location: [facility];  Service: Orthopedics;  Laterality: Right;  . I&D EXTREMITY Right 11/28/2017   Procedure: IRRIGATION AND DEBRIDEMENT RIGHT KNEE , APPLY  INSTILLATION VAC;  Surgeon: [name], MD;  Location: [facility];  Service: Orthopedics;  Laterality: Right;  . I&D EXTREMITY Right 12/03/2017   Procedure: REPEAT IRRIGATION AND DEBRIDEMENT RIGHT KNEE AND APPLICATION OF A WOUND VAC.;  Surgeon: [name], MD;  Location: [facility];  Service: Orthopedics;  Laterality: Right;  . KNEE ARTHROSCOPY Left    menisectomy  . LOWER EXTREMITY ANGIOGRAPHY N/A 03/03/2018   Procedure: LOWER EXTREMITY ANGIOGRAPHY;  Surgeon: Serafina Mitchell, MD;  Location: [facility];  Service: Cardiovascular;  Laterality: N/A;  . MULTIPLE TOOTH EXTRACTIONS    . PERIPHERAL VASCULAR ATHERECTOMY Left 03/03/2018   Procedure: PERIPHERAL VASCULAR ATHERECTOMY;  Surgeon: Serafina Mitchell, MD;  Location: [facility];  Service: Cardiovascular;  Laterality: Left;  common femoral and left SFA  . PERIPHERAL VASCULAR INTERVENTION  03/03/2018   Procedure: PERIPHERAL VASCULAR INTERVENTION;  Surgeon: Serafina Mitchell, MD;  Location: [facility];  Service: Cardiovascular;;  left SFA  . SKIN SPLIT GRAFT Right 07/25/2017   Procedure: Repeat Irrigation and Debridement Right Knee, Split Thickness Skin Graft;  Surgeon: [name], MD;  Location: [facility];  Service: Orthopedics;  Laterality: Right;  . SKIN SPLIT GRAFT Right 12/05/2017   Procedure: SKIN GRAFT SPLIT THICKNESS WOUND KNEE, APPLY VAC;  Surgeon: [name], MD;  Location: [facility];  Service: Orthopedics;  Laterality: Right;  . TONSILLECTOMY      Family History  Problem Relation Age of Onset  .  Stroke Mother   . Varicose Veins Mother   . Cancer Father        prostate  . Stroke Sister   . Heart disease Sister   . Varicose Veins Sister   . Stroke Maternal Grandmother   . Varicose Veins Sister     Social History:  reports that she quit smoking about 38 years ago. She has a 5.00 pack-year smoking history. She has never used smokeless tobacco. She reports that she drinks about 14.0 standard drinks of alcohol per week.  She reports that she does not use drugs.  Allergies:  Allergies  Allergen Reactions  . Cinnamon Hives  . Ciprofloxacin Other (See Comments)    TREMORS  . Diovan [Valsartan] Other (See Comments)    Extreme vertigo  . Food Diarrhea and Other (See Comments)    [place] JUICE   UPSET STOMACH  . Latex Rash and Other (See Comments)    Rash/inflammation due to exposure  . Nitrofuran Derivatives Hives and Rash    "Full body rash"  . Penicillins Hives and Swelling    *tolerated Ceftriaxone September 2018 Has patient had a PCN reaction causing immediate rash, facial/tongue/throat swelling, SOB or lightheadedness with hypotension:No--severe irritation at the injection site Has patient had a PCN reaction causing severe rash involving mucus membranes or skin necrosis:Unknown Has patient had a PCN reaction that required hospitalization:No Has patient had a PCN reaction occurring within the last 10 years:Yes If all of the above answers are "NO", then may proceed with  . Bactrim [Sulfamethoxazole-Trimethoprim] Diarrhea and Nausea Only  . Other Rash    Mycins, Strawberry, Oranges  . Sulfa Antibiotics Diarrhea and Nausea Only    Medications: I have reviewed the patient's current medications.  Results for orders placed or performed during the hospital encounter of 06/29/18 (from the past 48 hour(s))  Sedimentation rate     Status: Abnormal   Collection Time: 06/30/18  7:16 PM  Result Value Ref Range   Sed Rate 62 (H) 0 - 22 mm/hr    Comment: Performed at [facility], [address] [place] [address], [place], [place] [address]  C-reactive protein     Status: Abnormal   Collection Time: 07/01/18  5:39 AM  Result Value Ref Range   CRP 3.4 (H) <1.0 mg/dL    Comment: Performed at [facility] [address] [place], [place] [address]  Aerobic Culture (superficial specimen)     Status: None (Preliminary result)   Collection Time: 07/01/18  4:17 PM  Result Value Ref Range   Specimen Description WOUND  ANKLE    Special Requests NONE    Gram Stain      RARE WBC PRESENT, PREDOMINANTLY PMN MODERATE GRAM POSITIVE COCCI MODERATE GRAM NEGATIVE RODS    Culture      ABUNDANT PSEUDOMONAS AERUGINOSA SUSCEPTIBILITIES TO FOLLOW CULTURE REINCUBATED FOR BETTER GROWTH Performed at [facility], [place] [address], [place], [place] [address]    Report Status PENDING   CBC with Differential/Platelet     Status: Abnormal   Collection Time: 07/02/18  4:43 AM  Result Value Ref Range   WBC 7.0 4.0 - 10.5 K/uL   RBC 3.05 (L) 3.87 - 5.11 MIL/uL   Hemoglobin 8.3 (L) 12.0 - 15.0 g/dL   HCT 27.6 (L) 36.0 - 46.0 %   MCV 90.5 78.0 - 100.0 fL   MCH 27.2 26.0 - 34.0 pg   MCHC 30.1 30.0 - 36.0 g/dL   RDW 16.3 (H) 11.5 -  15.5 %   Platelets 308 150 - 400 K/uL   Neutrophils Relative % 60 %   Neutro Abs 4.2 1.7 - 7.7 K/uL   Lymphocytes Relative 19 %   Lymphs Abs 1.4 0.7 - 4.0 K/uL   Monocytes Relative 10 %   Monocytes Absolute 0.7 0.1 - 1.0 K/uL   Eosinophils Relative 10 %   Eosinophils Absolute 0.7 0.0 - 0.7 K/uL   Basophils Relative 1 %   Basophils Absolute 0.0 0.0 - 0.1 K/uL   Immature Granulocytes 0 %   Abs Immature Granulocytes 0.0 0.0 - 0.1 K/uL    Comment: Performed at [facility] [address], [place], [place] [address]  Basic metabolic panel     Status: Abnormal   Collection Time: 07/02/18  4:43 AM  Result Value Ref Range   Sodium 137 135 - 145 mmol/L   Potassium 4.7 3.5 - 5.1 mmol/L   Chloride 107 98 - 111 mmol/L   CO2 25 22 - 32 mmol/L   Glucose, Bld 83 70 - 99 mg/dL   BUN 34 (H) 8 - 23 mg/dL   Creatinine, Ser 1.23 (H) 0.44 - 1.00 mg/dL   Calcium 9.5 8.9 - 10.3 mg/dL   GFR calc non Af Amer 40 (L) >60 mL/min   GFR calc Af Amer 47 (L) >60 mL/min    Comment: (NOTE) The eGFR has been calculated using the CKD EPI equation. This calculation has not been validated in all clinical situations. eGFR's persistently <60 mL/min signify possible Chronic Kidney Disease.    Anion gap  5 5 - 15    Comment: Performed at [facility] [address] [place], [place] [address]    Mr Ankle Left Wo Contrast  Result Date: 07/01/2018 CLINICAL DATA:  Chronic wound of the left foot. Evaluate for osteomyelitis. EXAM: MRI OF THE LEFT ANKLE WITHOUT CONTRAST TECHNIQUE: Multiplanar, multisequence MR imaging of the ankle was performed. No intravenous contrast was administered. COMPARISON:  None. FINDINGS: TENDONS Peroneal: Peroneal longus tendon intact. Peroneal brevis intact. Posteromedial: Posterior tibial tendon intact. Flexor hallucis longus tendon intact. Flexor digitorum longus tendon intact. Anterior: Complete tear of the tibialis anterior tendon. Extensor hallucis longus tendon intact Extensor digitorum longus tendon intact. Achilles:  Mild tendinosis of Achilles tendon without a tear. Plantar Fascia: Intact. LIGAMENTS Lateral: Anterior talofibular ligament intact. Calcaneofibular ligament intact. Posterior talofibular ligament intact. Anterior and posterior tibiofibular ligaments intact. Medial: Deltoid ligament intact. Spring ligament intact. CARTILAGE Ankle Joint: Small ankle joint effusion. High-grade partial-thickness cartilage loss of the tibiotalar joint with subchondral reactive marrow edema at the medial corner of the talar dome. Subtalar Joints/Sinus Tarsi: Normal subtalar joints. No subtalar joint effusion. Normal sinus tarsi. Bones: No periosteal reaction or bone destruction. No acute fracture or dislocation. Soft Tissue: Soft tissue wound along the dorsal aspect of midfoot. IMPRESSION: 1. Soft tissue wound along the dorsal aspect of the midfoot. No evidence of osteomyelitis of left ankle. 2. Complete tear of the tibialis anterior tendon. 3. Mild-moderate osteoarthritis of the left ankle. 4. Mild tendinosis of Achilles tendon without a tear. Electronically Signed   By: Kathreen Devoid   On: 07/01/2018 10:20    Review of Systems  Musculoskeletal: Positive for joint pain (Left  foot).   Blood pressure (!) 115/44, pulse 79, temperature 99.1 F (37.3 C), temperature source Oral, resp. rate 16, height 5' 6"  (1.676 m), weight 81.7 kg, SpO2 92 %. Physical Exam  Constitutional: She appears well-developed and well-nourished. No distress.  HENT:  Head: Normocephalic and atraumatic.  Eyes: Conjunctivae are normal. Right eye exhibits no discharge. Left eye exhibits no discharge. No scleral icterus.  Neck: Normal range of motion.  Cardiovascular: Normal rate and regular rhythm.  Respiratory: Effort normal. No respiratory distress.  Neurological: She is alert.  Skin: Skin is warm and dry. She is not diaphoretic.  Psychiatric: She has a normal mood and affect. Her behavior is normal.    Assessment/Plan: Left foot ulcer in setting of severe PVD -- Shelby Reese unfortunately has very limited options for care of this ulcer. She can elect for an AKA which has been recommended by both vascular surgery and orthopedic surgery. She is categorically opposed to this. The other option is to continue treating the wound locally through the wound center and be prepared for exacerbations that require hospitalization. She understands that one of those exacerbations may end her life. At this point she would like to die and requested hospice/palliative care consult. This was communicated to her primary.    Lisette Abu, PA-C Orthopedic Surgery [phone] 07/02/2018, 4:05 PM

## 2018-07-03 DIAGNOSIS — G8929 Other chronic pain: Secondary | ICD-10-CM

## 2018-07-03 DIAGNOSIS — Z515 Encounter for palliative care: Secondary | ICD-10-CM

## 2018-07-03 DIAGNOSIS — Z7189 Other specified counseling: Secondary | ICD-10-CM

## 2018-07-03 DIAGNOSIS — M79605 Pain in left leg: Secondary | ICD-10-CM

## 2018-07-03 LAB — SODIUM, URINE, RANDOM: Sodium, Ur: 71 mmol/L

## 2018-07-03 LAB — CREATININE, URINE, RANDOM: CREATININE, URINE: 34.53 mg/dL

## 2018-07-03 NOTE — Progress Notes (Signed)
[facility] Liaison: RN visit  Notified by Magdalen Spatz, [facility] of patient/family request for [facility] services at home after discharge. Chart and patient information under review by [facility] physician. Hospice eligibility pending at this time.  Writer spoke with patient at bedside to initiate education related to hospice philosophy, services and team approach to care. Patient verbalized understanding of information given. Per discussion, plan is for discharge to home by private vehicle on 07/04/2018.  Please send signed and completed DNR form home with patient/family. Patient will need prescriptions for discharge comfort medications.  DME needs have been discussed, patient currently has the following equipment in the home: W/C, 3N1, slide board.  Patient/family requests the following DME for delivery to the home: none.   [facility] aware of the above. Please notify [facility] when patient is ready to leave the unit at discharge. (Call [phone] or [phone] after 5pm.) [facility] information and contact numbers given to patient  at time of visit. Above information shared with Magdalen Spatz, CMRN.  Please call with any hospice related questions.  Thank you for this referral.  Farrel Gordon, RN, [facility] Liaison [phone] ? Hospital liaisons are now on [place].

## 2018-07-03 NOTE — Consult Note (Signed)
Consultation Note Date: 07/03/2018   Patient Name: Shelby Reese  DOB: 1934/12/01  MRN: [id number]  Age / Sex: 83 y.o., female  PCP: [name], MD Referring Physician: Monica Becton, MD  Reason for Consultation: Establishing goals of care, Hospice Evaluation, Non pain symptom management, Pain control and Psychosocial/spiritual support  HPI/Patient Profile: 83 y.o. female  with past medical history of hypertension, hypothyroidism, chronic kidney disease stage III, history of hemorrhagic shock with unknown source (April 2019), severe peripheral arterial disease status post right AKA January 2019 admitted on 06/29/2018 with low hemoglobin, left foot pain, left lower extremity erythema, fatigue.  She was admitted in April 2019 with left foot ulcers and underwent vascular stenting.  Unfortunately, she is now being presented with the options of another amputation to her left lower extremity as she just was in January 2019 where she underwent a right AKA.  She is refusing amputation  Consult ordered for goals of care.   Clinical Assessment and Goals of Care: Pt seen, chart reviewed.  Patient is alert and oriented x3.  She states "I have been healthy up until I turned 82 and then it all went to hell".  As noted above, she underwent a right AKA in January 2019 and has found it very debilitating in terms of quality of life.  She is now admitted with worsening peripheral arterial disease to her left lower extremity and has been seen by orthopedics.  Recommendations were amputation or proceeding more with a comfort based approach, wound care oral antibiotics but this would not be curable.  Patient states that she underwent a right AKA, she does not feel that she would emotionally want to go through this on the left.  She  recognizes that without an amputation that this would mean the end of her life. Pt is a retired  Marine scientist    Her short and long-term memory appear to be intact.  Her thought processes are linear and goal-directed.  She presents as someone that has capacity.  Patient is capable of making her own decisions at this point.  She is married but her husband has early  Dementia.  Patient has a caregiver who assist her from the hours of 8 in the morning till 3 in the afternoon.  She reports that these services can be expanded asher needs increase.  She verbalizes desire for comfort and dignity.  At this point we did talk about resources specifically hospice care in the home.    SUMMARY OF RECOMMENDATIONS   Patient is DNR/DNI Home with hospice and continued private pay caregiver support Consult placed to case management to facilitate referral Patient would like to continue oral antibiotics if this would improve quality, and provide any longevity, as well as wound care at the would care center Patient is aware that hospice does not do IV antibiotics, IV fluids, nor would they provide transportation to wound care center Code Status/Advance Care Planning:  DNR    Symptom Management:   Pain: Continue with Vicodin 1 to 2 tablets  every 4 hours as needed as well as gabapentin 200 mg twice daily.  Palliative Prophylaxis:   Aspiration, Bowel Regimen, Eye Care, Frequent Pain Assessment, Oral Care and Turn Reposition  Additional Recommendations (Limitations, Scope, Preferences):  Avoid Hospitalization, Minimize Medications, No Artificial Feeding, No Blood Transfusions, No Chemotherapy, No Hemodialysis, No Radiation, No Surgical Procedures and No Tracheostomy  Psycho-social/Spiritual:   Desire for further Chaplaincy support:no  Additional Recommendations: Referral to Community Resources   Prognosis:   < 6 months in the setting of severe peripheral arterial disease to left lower extremity; status post right AKA 2019 with comorbidities of chronic kidney disease stage III hypertension  Discharge  Planning: Home with Hospice      Primary Diagnoses: Present on Admission: . Acute renal failure superimposed on stage 3 chronic kidney disease ([place]) . Hypothyroidism, acquired . Iron deficiency anemia . Essential hypertension . Hypertension . Peripheral vascular disease of lower extremity with ulceration ([place]) . Symptomatic anemia   I have reviewed the medical record, interviewed the patient and family, and examined the patient. The following aspects are pertinent.  Past Medical History:  Diagnosis Date  . Allergy   . Anemia   . CKD (chronic kidney disease), stage II   . Fibromyalgia   . GERD (gastroesophageal reflux disease)   . Hiatal hernia   . Hypertension   . Hypothyroid   . Lymphedema    venous insufficency  . Osteoarthritis   . Peripheral vascular disease ([place])   . Ulcer of knee ([place])    right  . Urinary incontinence   . Varicose veins   . Venous insufficiency    Social History   Socioeconomic History  . Marital status: Married    Spouse name: Not on file  . Number of children: 0  . Years of education: Not on file  . Highest education level: Not on file  Occupational History    Comment: retired Marine scientist.   Social Needs  . Financial resource strain: Not on file  . Food insecurity:    Worry: Not on file    Inability: Not on file  . Transportation needs:    Medical: Not on file    Non-medical: Not on file  Tobacco Use  . Smoking status: Former Smoker    Packs/day: 0.50    Years: 10.00    Pack years: 5.00    Last attempt to quit: 11/19/1979    Years since quitting: 38.6  . Smokeless tobacco: Never Used  Substance and Sexual Activity  . Alcohol use: Yes    Alcohol/week: 14.0 standard drinks    Types: 14 Glasses of wine per week    Comment: 2 glasses of wine with dinner  . Drug use: No  . Sexual activity: Not on file  Lifestyle  . Physical activity:    Days per week: Not on file    Minutes per session: Not on file  . Stress: Not on file    Relationships  . Social connections:    Talks on phone: Not on file    Gets together: Not on file    Attends religious service: Not on file    Active member of club or organization: Not on file    Attends meetings of clubs or organizations: Not on file    Relationship status: Not on file  Other Topics Concern  . Not on file  Social History Narrative  . Not on file   Family History  Problem Relation Age of Onset  . Stroke  Mother   . Varicose Veins Mother   . Cancer Father        prostate  . Stroke Sister   . Heart disease Sister   . Varicose Veins Sister   . Stroke Maternal Grandmother   . Varicose Veins Sister    Scheduled Meds: . acetaminophen  1,000 mg Oral Daily  . calcium-vitamin D  1 tablet Oral QPM  . diltiazem  120 mg Oral Daily  . famotidine  40 mg Oral QPM  . gabapentin  200 mg Oral BID  . levothyroxine  175 mcg Oral QAC breakfast  . magnesium oxide  200 mg Oral QPM  . multivitamin with minerals  1 tablet Oral QPM  . nutrition supplement (JUVEN)  1 packet Oral BID BM  . pantoprazole  40 mg Oral BID  . vitamin C  500 mg Oral QPM   Continuous Infusions: . ceFEPime (MAXIPIME) IV Stopped (07/02/18 1716)  . vancomycin 150 mL/hr at 07/02/18 1804   PRN Meds:.acetaminophen **OR** acetaminophen, HYDROcodone-acetaminophen, ondansetron **OR** ondansetron (ZOFRAN) IV Medications Prior to Admission:  Prior to Admission medications   Medication Sig Start Date End Date Taking? Authorizing Provider  acetaminophen (TYLENOL) 500 MG tablet Take 1,000 mg by mouth daily.   Yes [provider]  aspirin EC 81 MG tablet Take 81 mg by mouth every evening.    Yes [provider]  benazepril (LOTENSIN) 40 MG tablet Take 40 mg by mouth daily.   Yes [provider]  Calcium Carbonate-Vitamin D (CALCIUM 600+D) 600-400 MG-UNIT per tablet Take 1 tablet by mouth every evening.    Yes [provider]  cetirizine (ZYRTEC) 10 MG tablet Take 10 mg by mouth  at bedtime.    Yes [provider]  diltiazem (TIAZAC) 120 MG 24 hr capsule HOLD until seen by your PCP Patient taking differently: Take 120 mg by mouth daily.  03/18/18  Yes Oretha Milch D, MD  diphenhydrAMINE (BENADRYL) 25 mg capsule Take 25 mg by mouth 3 (three) times daily.    Yes [provider]  famotidine (PEPCID) 40 MG tablet Take 40 mg by mouth every evening.    Yes [provider]  ferrous sulfate 325 (65 FE) MG tablet Take 487.5 mg by mouth every evening.    Yes [provider]  FIBER PO Take 1 capsule by mouth 2 (two) times daily.   Yes [provider]  gabapentin (NEURONTIN) 100 MG capsule Take 200 mg by mouth 2 (two) times daily.    Yes [provider]  ibuprofen (ADVIL,MOTRIN) 200 MG tablet Take 400 mg by mouth daily.   Yes [provider]  levothyroxine (SYNTHROID, LEVOTHROID) 175 MCG tablet Take 175 mcg by mouth daily before breakfast.   Yes [provider]  Magnesium 125 MG CAPS Take 125 mg by mouth every evening.    Yes [provider]  Multiple Vitamins-Minerals (MULTIVITAMIN WITH MINERALS) tablet Take 1 tablet by mouth every evening.    Yes [provider]  nystatin-triamcinolone (MYCOLOG II) cream Apply 1 application topically See admin instructions. Apply topically to left leg every afternoon for wound care   Yes [provider]  vitamin C (ASCORBIC ACID) 500 MG tablet Take 500 mg by mouth every evening.    Yes [provider]  glucosamine-chondroitin 500-400 MG tablet Take 1 tablet by mouth every evening.     [provider]  oxyCODONE-acetaminophen (PERCOCET) 10-325 MG tablet Take 1 tablet by mouth every 4 (four) hours as  needed for pain. Patient not taking: Reported on 06/29/2018 04/29/18   Dalia Heading, PA-C  pantoprazole (PROTONIX) 40 MG tablet Take 1 tablet (40 mg total) by mouth daily. Patient not taking: Reported on 06/29/2018 12/24/17   Bonnell Public, MD   Allergies  Allergen Reactions  . Cinnamon Hives  . Ciprofloxacin Other (See Comments)    TREMORS  . Diovan [Valsartan] Other (See Comments)    Extreme vertigo  . Food Diarrhea and Other (See Comments)    ORANGE JUICE   UPSET STOMACH  . Latex Rash and Other (See Comments)    Rash/inflammation due to exposure  . Nitrofuran Derivatives Hives and Rash    "Full body rash"  . Penicillins Hives and Swelling    *tolerated Ceftriaxone September 2018 Has patient had a PCN reaction causing immediate rash, facial/tongue/throat swelling, SOB or lightheadedness with hypotension:No--severe irritation at the injection site Has patient had a PCN reaction causing severe rash involving mucus membranes or skin necrosis:Unknown Has patient had a PCN reaction that required hospitalization:No Has patient had a PCN reaction occurring within the last 10 years:Yes If all of the above answers are "NO", then may proceed with  . Bactrim [Sulfamethoxazole-Trimethoprim] Diarrhea and Nausea Only  . Other Rash    Mycins, Strawberry, Oranges  . Sulfa Antibiotics Diarrhea and Nausea Only   Review of Systems  Unable to perform ROS: Other    Physical Exam  Constitutional: She is oriented to person, place, and time.  Frail elderly female, no acute distress  HENT:  Head: Normocephalic and atraumatic.  Neck: Normal range of motion.  Cardiovascular: Normal rate.  Pulmonary/Chest: Effort normal.  Musculoskeletal: Normal range of motion.  Right AKA Left lower extremity erythema  Neurological: She is alert and oriented to person, place, and time.  Skin: Skin is warm and dry. There is pallor.  Psychiatric: She has a normal mood and affect. Her behavior is normal. Judgment and thought content normal.  Nursing note and vitals reviewed.   Vital Signs: BP (!) 148/50 (BP Location: Left Arm)   Pulse 86   Temp 98.7 F (37.1 C) (Oral)   Resp 18   Ht 5\' 6"  (1.676 m)   Wt 81.7 kg   SpO2 94%   BMI  29.07 kg/m  Pain Scale: 0-10 POSS *See Group Information*: 1-Acceptable,Awake and alert Pain Score: 0-No pain   SpO2: SpO2: 94 % O2 Device:SpO2: 94 % O2 Flow Rate: .   IO: Intake/output summary:   Intake/Output Summary (Last 24 hours) at 07/03/2018 1304 Last data filed at 07/03/2018 9892 Gross per 24 hour  Intake 242.25 ml  Output 1000 ml  Net -757.75 ml    LBM: Last BM Date: 06/29/18 Baseline Weight: Weight: 81.7 kg Most recent weight: Weight: 81.7 kg     Palliative Assessment/Data:   Flowsheet Rows     Most Recent Value  Intake Tab  Referral Department  Hospitalist  Unit at Time of Referral  Med/Surg Unit  Palliative Care Primary Diagnosis  Cardiac  Date Notified  07/02/18  Palliative Care Type  New Palliative care  Reason for referral  Clarify Goals of Care, Pain  Date of Admission  06/29/18  Date first seen by Palliative Care  07/03/18  # of days Palliative referral response time  1 Day(s)  # of days IP prior to Palliative referral  3  Clinical Assessment  Palliative Performance Scale Score  50%  Pain Max last 24 hours  Not able to report  Pain  Min Last 24 hours  Not able to report  Dyspnea Max Last 24 Hours  Not able to report  Dyspnea Min Last 24 hours  Not able to report  Nausea Max Last 24 Hours  Not able to report  Nausea Min Last 24 Hours  Not able to report  Anxiety Max Last 24 Hours  Not able to report  Anxiety Min Last 24 Hours  Not able to report  Other Max Last 24 Hours  Not able to report  Psychosocial & Spiritual Assessment  Palliative Care Outcomes  Patient/Family meeting held?  Yes  Who was at the meeting?  pt  Patient/Family wishes: Interventions discontinued/not started   Mechanical Ventilation, Hemodialysis, Transfusion, Tube feedings/TPN, PEG, Trach, BiPAP, Vasopressors, NIPPV  Palliative Care follow-up planned  No      Time In: 1100 Time Out: 1210 Time Total: 70 min Greater than 50%  of this time was spent counseling and  coordinating care related to the above assessment and plan.  Signed by: Dory Horn, NP   Please contact Palliative Medicine Team phone at [phone] for questions and concerns.  For individual provider: See Shea Evans

## 2018-07-03 NOTE — Progress Notes (Signed)
[facility] PROGRESS NOTE  Shelby Reese WLN:[id number] DOB: Nov 11, 1935 DOA: 06/29/2018 PCP: [name], MD  Assessment/Plan:   83 year old female with history of hypertension, hypothyroidism, CKD stage III, PAD A/P right AKA, left lower extremity severe atherosclerosis, atherectomy, stent placement in May 2019, chronic nonhealing ulcers to left lower extremity, recommended amputation, patient refused, history of hemorrhagic shock, GI bleed underwent work-up during previous admission without any obvious source of bleeding presented to the emergency department on 06/29/2018 with severe generalized weakness, increased swelling and pain of the left lower extremity.  Patient was found to have hemoglobin of 7.8 down from 10.2 from June 2019.  Stool occult was positive.  Concerning about necrotic ulcers on the left ankle, evaluated by orthopedic surgery who recommended about the knee amputation.  Patient refused and requested for second opinion, seen by Dr. Doreatha Martin who also recommends patient will benefit from amputation.  Patient refuses and wants to pursue palliative care/hospice.  Wound cultures are growing Pseudomonas sensitive to quinolones  ## Symptomatic anemia of chr disease   - Presents with fatigue and increased redness and swelling around chronic left foot ulcers  - Found to have Hgb of 7.8, down from 10.2 in June; she denies melena or hematochezia, stool reportedly brown in ED but FOBT+  - She had hemorrhagic shock in April but no definite source of bleeding was identified  - She has been using Advil BID for chronic pain and stopped taking her PPI but denies abdominal pain or indigestion; BUN is up, but so is creatinine   - Received 2 units of packed RBC in ER- Discontinue IV PPI q12h for now, check post-transfusion CBC   -iron profile consistent with anemia of chronic disease with the normal iron level, low iron binding capacity -Transfuse as needed hemoglobin less than 7 -Hemoglobin  stable  ## Infected nonhealing wounds of lt ankle -MR ankle to r/o osteo-negative for osteomyelitis -Evaluated by wound care, recommended debridement  - consulted ortho Dr. Geralyn Corwin amputation -Patient wants second opinion.  Consulted orthopedic surgery-seen by Dr. Doreatha Martin recommends amputation -Get wound cultures-moderate amount of gram-positive cocci, no Monus aeruginosa - on vancomycin, cefepime -Plan to discharge patient with ciprofloxacin -Evaluated by palliative care, plan to discharge patient home with palliative care  ## severe PVD - s/p AKA rt -Atherectomy, stent to left May 2019  ## Acute kidney injury superimposed on CKD III  - SCr is 1.44 on admission, up from 1.02 in June '19  - Hold ACE-i, -Avoid nephrotoxic agents -Improved with IV fluids    ## Hypertension  - BP at goal  - Continue diltiazem, hold ACE-i until renal function stabilizes   ## Hypothyroidism  - Continue Synthroid   Debility -Involved physical therapy  Code Status:  full Family Communication:  Patient  Disposition Plan:  SNF   Consultants:   ortho  HPI/Subjective: Patient is upset this morning about the recommendations for amputation.  Wants second opinion  Objective: Vitals:   07/02/18 2114 07/03/18 0534  BP: (!) 121/42 (!) 148/50  Pulse: 83 86  Resp: 17 18  Temp: 98.8 F (37.1 C) 98.7 F (37.1 C)  SpO2: 95% 94%    Intake/Output Summary (Last 24 hours) at 07/03/2018 1504 Last data filed at 07/03/2018 1457 Gross per 24 hour  Intake 542.25 ml  Output 1000 ml  Net -457.75 ml   Filed Weights   06/29/18 2228  Weight: 81.7 kg    Exam:  Constitutional: NAD, calm,  Eyes: PERTLA, lids and conjunctivae normal  ENMT: Mucous membranes are moist. Posterior pharynx clear of any exudate or lesions.   Neck: normal, supple, no masses, no thyromegaly Respiratory: clear to auscultation bilaterally, no wheezing, no crackles. Normal respiratory effort.    Cardiovascular: S1 &  S2 heard, regular rate and rhythm. No significant JVD. Abdomen: No distension, no tenderness, soft. Bowel sounds normal.  Musculoskeletal: no clubbing / cyanosis. Status-post right AKA.    Skin: Ulcers to dorsal leftankle wiith surrounding erythema, purulant drainge. Neurologic: CN 2-12 grossly intact. Strength 5/5 in all 4 limbs.  Psychiatric: Alert and oriented to person, place, and situation. Calm, cooperative Data Reviewed: Basic Metabolic Panel: Recent Labs  Lab 06/29/18 1719 06/30/18 0451 07/02/18 0443  NA 135 137 137  K 4.6 4.7 4.7  CL 104 106 107  CO2 22 23 25   GLUCOSE 77 89 83  BUN 42* 35* 34*  CREATININE 1.44* 1.47* 1.23*  CALCIUM 9.7 9.5 9.5   Liver Function Tests: Recent Labs  Lab 06/29/18 1719  AST 13*  ALT 9  ALKPHOS 46  BILITOT 0.4  PROT 6.3*  ALBUMIN 3.1*   No results for input(s): LIPASE, AMYLASE in the last 168 hours. No results for input(s): AMMONIA in the last 168 hours. CBC: Recent Labs  Lab 06/29/18 1719 06/30/18 0451 07/02/18 0443  WBC 7.2 7.9 7.0  NEUTROABS 4.6  --  4.2  HGB 7.8* 7.9* 8.3*  HCT 26.6* 25.2* 27.6*  MCV 90.8 87.2 90.5  PLT 353 298 308   Cardiac Enzymes: No results for input(s): CKTOTAL, CKMB, CKMBINDEX, TROPONINI in the last 168 hours. BNP (last 3 results) Recent Labs    06/29/18 1719  BNP 66.1    ProBNP (last 3 results) No results for input(s): PROBNP in the last 8760 hours.  CBG: No results for input(s): GLUCAP in the last 168 hours.  Recent Results (from the past 240 hour(s))  Aerobic Culture (superficial specimen)     Status: None (Preliminary result)   Collection Time: 07/01/18  4:17 PM  Result Value Ref Range Status   Specimen Description WOUND ANKLE  Final   Special Requests NONE  Final   Gram Stain   Final    RARE WBC PRESENT, PREDOMINANTLY PMN MODERATE GRAM POSITIVE COCCI MODERATE GRAM NEGATIVE RODS Performed at [facility], [address], [place], [place] [address]    Culture ABUNDANT  PSEUDOMONAS AERUGINOSA  Final   Report Status PENDING  Incomplete   Organism ID, Bacteria PSEUDOMONAS AERUGINOSA  Final      Susceptibility   Pseudomonas aeruginosa - MIC*    CEFTAZIDIME 4 SENSITIVE Sensitive     CIPROFLOXACIN <=0.25 SENSITIVE Sensitive     GENTAMICIN 2 SENSITIVE Sensitive     IMIPENEM >=16 RESISTANT Resistant     PIP/TAZO 32 SENSITIVE Sensitive     CEFEPIME 2 SENSITIVE Sensitive     * ABUNDANT PSEUDOMONAS AERUGINOSA     Studies: No results found.  Scheduled Meds: . acetaminophen  1,000 mg Oral Daily  . calcium-vitamin D  1 tablet Oral QPM  . diltiazem  120 mg Oral Daily  . famotidine  40 mg Oral QPM  . gabapentin  200 mg Oral BID  . levothyroxine  175 mcg Oral QAC breakfast  . magnesium oxide  200 mg Oral QPM  . multivitamin with minerals  1 tablet Oral QPM  . nutrition supplement (JUVEN)  1 packet Oral BID BM  . pantoprazole  40 mg Oral BID  . vitamin C  500 mg Oral QPM  Continuous Infusions: . ceFEPime (MAXIPIME) IV Stopped (07/02/18 1716)  . 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 If 7PM-7AM, please contact night-coverage at [email or web address], password [facility] 07/03/2018, 3:04 PM  LOS: 4 days

## 2018-07-03 NOTE — Care Management Note (Signed)
Case Management Note  Patient Details  Name: Shelby Reese MRN: [id number] Date of Birth: 01-Jan-1935  Subjective/Objective:                    Action/Plan:  Spoke with Dr Lunette Stands , plan discharge 07/04/18 to home with hospice following.   Discussed same with patient at bedside. Patient is a retired Research officer, trade [employer]. S/p lt AKA Jan 2019 , does not want another amputation.   Patient from home with husband who stills drives. She states husband will drive her home in private car not discharge. She does not want ambulance transport home.   Provided list of home hospice agencies . Patient wants [facility], called referral to Amy [phone]. Amy aware discharge is planned for 07/04/18.  Patient has private pay caregiver at home 8 am to 3 pm and when patient needs to increase caregivers hours she can.   Confirmed face sheet information with patient.  Confirmed PCP is DR [name].   DME patient already has: wheelchair, sliding bench, bedside commode with a drop arm. Patient REFUSES hospital bed.  Expected Discharge Date:                  Expected Discharge Plan:  Home w Hospice Care  In-House Referral:     Discharge planning Services  CM Consult  Post Acute Care Choice:    Choice offered to:  Patient  DME Arranged:  N/A DME Agency:  NA  HH Arranged:    Comfort Agency:  [facility]  Status of Service:  Completed, signed off  If discussed at [place] of Stay Meetings, dates discussed:    Additional Comments:  Marilu Favre, RN 07/03/2018, 3:34 PM

## 2018-07-04 DIAGNOSIS — G8929 Other chronic pain: Secondary | ICD-10-CM

## 2018-07-04 DIAGNOSIS — M79605 Pain in left leg: Secondary | ICD-10-CM

## 2018-07-04 LAB — AEROBIC CULTURE  (SUPERFICIAL SPECIMEN)

## 2018-07-04 LAB — UREA NITROGEN, URINE: Urea Nitrogen, Ur: 441 mg/dL

## 2018-07-04 LAB — AEROBIC CULTURE W GRAM STAIN (SUPERFICIAL SPECIMEN)

## 2018-07-04 MED ORDER — HYDROCODONE-ACETAMINOPHEN 5-325 MG PO TABS: 1.0000 | ORAL_TABLET | Freq: Four times a day (QID) | ORAL | 0 refills | Status: AC | PRN

## 2018-07-04 MED ORDER — CIPROFLOXACIN HCL 500 MG PO TABS: 500.0000 mg | ORAL_TABLET | Freq: Two times a day (BID) | ORAL | 0 refills | Status: AC

## 2018-07-04 MED ORDER — FAMOTIDINE 20 MG PO TABS: 40.0000 mg | ORAL_TABLET | Freq: Two times a day (BID) | ORAL | 0 refills | Status: AC

## 2018-07-04 NOTE — Discharge Summary (Signed)
Physician Discharge Summary  Shelby Reese VCB:[id number] DOB: Apr 20, 1935 DOA: 06/29/2018  PCP: [name], MD  Admit date: 06/29/2018 Discharge date: 07/04/2018  Time spent: 40 minutes  Recommendations for Outpatient Follow-up:  1. The patient will continue with the hospice care plan 2. 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)   Moderate protein-calorie malnutrition (HCC)   Goals of care, counseling/discussion   Palliative care by specialist   Chronic pain of left lower extremity   Discharge Condition: Stable Diet recommendation: Cardiac  Filed Weights   06/29/18 2228  Weight: 81.7 kg    History of present illness and Hospital Course:  83 year old female with history of hypertension, hypothyroidism, CKD stage III, PAD A/P right AKA, left lower extremity severe atherosclerosis, atherectomy, stent placement in May 2019, chronic nonhealing ulcers to left lower extremity, recommended amputation, patient refused, history of hemorrhagic shock, GI bleed underwent work-up during previous admission without any obvious source of bleeding presented to the emergency department on 06/29/2018 with severe generalized weakness, increased swelling and pain of the left lower extremity.  Patient was found to have hemoglobin of 7.8 down from 10.2 from June 2019.  Stool occult was positive.  Concerning about necrotic ulcers on the left ankle, evaluated by orthopedic surgery who recommended about the knee amputation.  Patient refused for amputation and requested for second opinion, seen by Dr. Doreatha Martin who also recommends patient  will benefit from amputation.  MRI of the left ankle is negative for osteomyelitis Patient refuses and wants to pursue palliative care/hospice.  Wound cultures are growing Pseudomonas sensitive to quinolones.  Patient had allergy to quinolones causing tremors.  However patient does not completely remember the type of tremors.  Patient wants to try ciprofloxacin at home.  However also concern about this could be colonization as patient had Pseudomonas positive in the wound multiple times.  Patient is never septic.  Patient wants to go home with hospice care. Recommended patient to continue with wound care at home.  ##Symptomatic anemia of chr disease -Presents with fatigue and increased redness and swelling around chronic left foot ulcers -Found to have Hgb of 7.8, down from 10.2 in June; she denies melena or hematochezia, stool reportedly brown in ED but FOBT+ -She had hemorrhagic shock in April but no definite source of bleeding was identified -She has been using Advil BID for chronic pain and stopped taking her PPI but denies abdominal pain or indigestion; BUN is up, but so is creatinine -Received 2 units of packed RBC in ER-Discontinue IV PPI q12h for now, check post-transfusion CBC  -iron profile consistent with anemia of chronic disease with the normal iron level, low iron binding capacity -Transfuse as needed hemoglobin less than 7 -Hemoglobin stable  ## Infected nonhealing wounds of lt ankle -MR ankle to r/o osteo-negative for osteomyelitis -Evaluated by wound care, recommended debridement  - consulted ortho Dr. Geralyn Corwin amputation -Patient wants second opinion.  Consulted orthopedic surgery-seen by Dr. Doreatha Martin recommends amputation -Get wound cultures-moderate amount of gram-positive cocci, no Monus aeruginosa - on vancomycin, cefepime -Plan to discharge patient with ciprofloxacin -Evaluated by palliative care, plan to discharge patient home with palliative  care  ## severe PVD - s/p AKA rt -Atherectomy, stent to left May 2019  ##Acute kidney injury superimposed on CKD III -SCr is 1.44 on admission, up from 1.02 in June '19 -Hold ACE-i, -Avoid nephrotoxic agents -Improved with IV fluids   ##Hypertension -BP at goal -Continue diltiazem, hold ACE-i until renal function stabilizes  ##Hypothyroidism -Continue Synthroid  Debility -Involved physical therapy   Consultations:  Orthopedic surgery  Palliative care  Wound care  Physical therapy  Discharge Exam: Vitals:   07/04/18 0520 07/04/18 1350  BP: (!) 140/56 (!) 157/59  Pulse: 71 79  Resp: 16 (!) 1  Temp: (!) 97.5 F (36.4 C) 98.5 F (36.9 C)  SpO2: 96% 98%    Constitutional:NAD, calm,  Eyes:PERTLA, lids and conjunctivae normal ENMT:Mucous membranes are moist. Posterior pharynx clear of any exudate or lesions.  Neck:normal, supple, no masses, no thyromegaly Respiratory:clear to auscultation bilaterally, no wheezing, no crackles. Normal respiratory effort.  Cardiovascular:S1 &S2 heard, regular rate and rhythm. No significant JVD. Abdomen:No distension, no tenderness, soft. Bowel sounds normal.  Musculoskeletal:no clubbing / cyanosis. Status-post right AKA.  Skin:Ulcers to dorsal leftankle wiith surrounding erythema, purulant drainge. Neurologic:CN 2-12 grossly intact. Strength 5/5 in all 4 limbs.  Psychiatric:Alert and oriented to person, place, and situation.Calm, cooperative Discharge Instructions   Discharge Instructions    Diet - low sodium heart healthy   Complete by:  As directed    Increase activity slowly   Complete by:  As directed      Allergies as of 07/04/2018      Reactions   Cinnamon Hives   Ciprofloxacin Other (See Comments)   TREMORS   Diovan [valsartan] Other (See Comments)   Extreme vertigo   Food Diarrhea, Other (See Comments)   ORANGE JUICE   UPSET STOMACH   Latex Rash, Other (See Comments)    Rash/inflammation due to exposure   Nitrofuran Derivatives Hives, Rash   "Full body rash"   Penicillins Hives, Swelling   *tolerated Ceftriaxone September 2018 Has patient had a PCN reaction causing immediate rash, facial/tongue/throat swelling, SOB or lightheadedness with hypotension:No--severe irritation at the injection site Has patient had a PCN reaction causing severe rash involving mucus membranes or skin necrosis:Unknown Has patient had a PCN reaction that required hospitalization:No Has patient had a PCN reaction occurring within the last 10 years:Yes If all of the above answers are "NO", then may proceed with   Bactrim [sulfamethoxazole-trimethoprim] Diarrhea, Nausea Only   Other Rash   Mycins, Strawberry, Oranges   Sulfa Antibiotics Diarrhea, Nausea Only      Medication List    STOP taking these medications   acetaminophen 500 MG tablet Commonly known as:  TYLENOL   benazepril 40 MG tablet Commonly known as:  LOTENSIN   oxyCODONE-acetaminophen 10-325 MG tablet Commonly known as:  PERCOCET     TAKE these medications   aspirin EC 81 MG tablet Take 81 mg by mouth every evening.   CALCIUM 600+D 600-400 MG-UNIT tablet Generic drug:  Calcium Carbonate-Vitamin D Take 1 tablet by mouth every evening.   cetirizine 10 MG tablet Commonly known as:  ZYRTEC Take 10 mg by mouth at bedtime.   ciprofloxacin 500 MG tablet Commonly known as:  CIPRO Take 1 tablet (500 mg total) by mouth 2 (two) times daily for 10 days.   diltiazem 120 MG 24 hr capsule Commonly known as:  TIAZAC HOLD until seen by your PCP What changed:    how much to take  how to take  this  when to take this  additional instructions   diphenhydrAMINE 25 mg capsule Commonly known as:  BENADRYL Take 25 mg by mouth 3 (three) times daily.   famotidine 20 MG tablet Commonly known as:  PEPCID Take 2 tablets (40 mg total) by mouth 2 (two) times daily. What changed:    medication strength  when to  take this   ferrous sulfate 325 (65 FE) MG tablet Take 487.5 mg by mouth every evening.   FIBER PO Take 1 capsule by mouth 2 (two) times daily.   gabapentin 100 MG capsule Commonly known as:  NEURONTIN Take 200 mg by mouth 2 (two) times daily.   glucosamine-chondroitin 500-400 MG tablet Take 1 tablet by mouth every evening.   HYDROcodone-acetaminophen 5-325 MG tablet Commonly known as:  NORCO/VICODIN Take 1 tablet by mouth every 6 (six) hours as needed for up to 5 days for severe pain.   ibuprofen 200 MG tablet Commonly known as:  ADVIL,MOTRIN Take 400 mg by mouth daily.   levothyroxine 175 MCG tablet Commonly known as:  SYNTHROID, LEVOTHROID Take 175 mcg by mouth daily before breakfast.   Magnesium 125 MG Caps Take 125 mg by mouth every evening.   multivitamin with minerals tablet Take 1 tablet by mouth every evening.   nystatin-triamcinolone cream Commonly known as:  MYCOLOG II Apply 1 application topically See admin instructions. Apply topically to left leg every afternoon for wound care   pantoprazole 40 MG tablet Commonly known as:  PROTONIX Take 1 tablet (40 mg total) by mouth daily.   vitamin C 500 MG tablet Commonly known as:  ASCORBIC ACID Take 500 mg by mouth every evening.      Allergies  Allergen Reactions  . Cinnamon Hives  . Ciprofloxacin Other (See Comments)    TREMORS  . Diovan [Valsartan] Other (See Comments)    Extreme vertigo  . Food Diarrhea and Other (See Comments)    ORANGE JUICE   UPSET STOMACH  . Latex Rash and Other (See Comments)    Rash/inflammation due to exposure  . Nitrofuran Derivatives Hives and Rash    "Full body rash"  . Penicillins Hives and Swelling    *tolerated Ceftriaxone September 2018 Has patient had a PCN reaction causing immediate rash, facial/tongue/throat swelling, SOB or lightheadedness with hypotension:No--severe irritation at the injection site Has patient had a PCN reaction causing severe rash involving  mucus membranes or skin necrosis:Unknown Has patient had a PCN reaction that required hospitalization:No Has patient had a PCN reaction occurring within the last 10 years:Yes If all of the above answers are "NO", then may proceed with  . Bactrim [Sulfamethoxazole-Trimethoprim] Diarrhea and Nausea Only  . Other Rash    Mycins, Strawberry, Oranges  . Sulfa Antibiotics Diarrhea and Nausea Only      The results of significant diagnostics from this hospitalization (including imaging, microbiology, ancillary and laboratory) are listed below for reference.    Significant Diagnostic Studies: Mr Ankle Left Wo Contrast  Result Date: 07/01/2018 CLINICAL DATA:  Chronic wound of the left foot. Evaluate for osteomyelitis. EXAM: MRI OF THE LEFT ANKLE WITHOUT CONTRAST TECHNIQUE: Multiplanar, multisequence MR imaging of the ankle was performed. No intravenous contrast was administered. COMPARISON:  None. FINDINGS: TENDONS Peroneal: Peroneal longus tendon intact. Peroneal brevis intact. Posteromedial: Posterior tibial tendon intact. Flexor hallucis longus tendon intact. Flexor digitorum longus tendon intact. Anterior: Complete tear of the tibialis anterior tendon. Extensor hallucis longus tendon intact Extensor digitorum longus tendon intact. Achilles:  Mild tendinosis  of Achilles tendon without a tear. Plantar Fascia: Intact. LIGAMENTS Lateral: Anterior talofibular ligament intact. Calcaneofibular ligament intact. Posterior talofibular ligament intact. Anterior and posterior tibiofibular ligaments intact. Medial: Deltoid ligament intact. Spring ligament intact. CARTILAGE Ankle Joint: Small ankle joint effusion. High-grade partial-thickness cartilage loss of the tibiotalar joint with subchondral reactive marrow edema at the medial corner of the talar dome. Subtalar Joints/Sinus Tarsi: Normal subtalar joints. No subtalar joint effusion. Normal sinus tarsi. Bones: No periosteal reaction or bone destruction. No acute  fracture or dislocation. Soft Tissue: Soft tissue wound along the dorsal aspect of midfoot. IMPRESSION: 1. Soft tissue wound along the dorsal aspect of the midfoot. No evidence of osteomyelitis of left ankle. 2. Complete tear of the tibialis anterior tendon. 3. Mild-moderate osteoarthritis of the left ankle. 4. Mild tendinosis of Achilles tendon without a tear. Electronically Signed   By: Kathreen Devoid   On: 07/01/2018 10:20   Dg Foot Complete Left  Result Date: 06/29/2018 CLINICAL DATA:  83 year old with chronic LEFT foot ulceration which has recently worsened. EXAM: LEFT FOOT - COMPLETE 3+ VIEW COMPARISON:  04/28/2018, 02/23/2018. FINDINGS: Diffuse soft tissue swelling. Ulceration involving the DORSAL surface overlying the midfoot. Since the examination 2 months ago, osteolysis involving the base of the fifth metatarsal. No evidence of osteomyelitis elsewhere. Severe degenerative changes involving the midfoot. Hammertoe deformities. IMPRESSION: 1. Possible osteomyelitis involving the base of the fifth metatarsal. No evidence of osteomyelitis elsewhere. 2. Severe degenerative changes involving the midfoot. Electronically Signed   By: Evangeline Dakin M.D.   On: 06/29/2018 18:40    Microbiology: Recent Results (from the past 240 hour(s))  Aerobic Culture (superficial specimen)     Status: None   Collection Time: 07/01/18  4:17 PM  Result Value Ref Range Status   Specimen Description WOUND ANKLE  Final   Special Requests NONE  Final   Gram Stain   Final    RARE WBC PRESENT, PREDOMINANTLY PMN MODERATE GRAM POSITIVE COCCI MODERATE GRAM NEGATIVE RODS Performed at [facility], [address], [place], [place] [address]    Culture   Final    ABUNDANT PSEUDOMONAS AERUGINOSA MODERATE SERRATIA LIQUEFACIENS    Report Status 07/04/2018 FINAL  Final   Organism ID, Bacteria PSEUDOMONAS AERUGINOSA  Final   Organism ID, Bacteria SERRATIA LIQUEFACIENS  Final      Susceptibility   Pseudomonas  aeruginosa - MIC*    CEFTAZIDIME 4 SENSITIVE Sensitive     CIPROFLOXACIN <=0.25 SENSITIVE Sensitive     GENTAMICIN 2 SENSITIVE Sensitive     IMIPENEM >=16 RESISTANT Resistant     PIP/TAZO 32 SENSITIVE Sensitive     CEFEPIME 2 SENSITIVE Sensitive     * ABUNDANT PSEUDOMONAS AERUGINOSA   Serratia liquefaciens - MIC*    CEFAZOLIN >=64 RESISTANT Resistant     CEFEPIME <=1 SENSITIVE Sensitive     CEFTAZIDIME <=1 SENSITIVE Sensitive     CEFTRIAXONE <=1 SENSITIVE Sensitive     CIPROFLOXACIN <=0.25 SENSITIVE Sensitive     GENTAMICIN <=1 SENSITIVE Sensitive     IMIPENEM 0.5 SENSITIVE Sensitive     TRIMETH/SULFA <=20 SENSITIVE Sensitive     PIP/TAZO <=4 SENSITIVE Sensitive     * MODERATE SERRATIA LIQUEFACIENS     Labs: Basic Metabolic Panel: Recent Labs  Lab 06/29/18 1719 06/30/18 0451 07/02/18 0443  NA 135 137 137  K 4.6 4.7 4.7  CL 104 106 107  CO2 22 23 25   GLUCOSE 77 89 83  BUN 42* 35* 34*  CREATININE  1.44* 1.47* 1.23*  CALCIUM 9.7 9.5 9.5   Liver Function Tests: Recent Labs  Lab 06/29/18 1719  AST 13*  ALT 9  ALKPHOS 46  BILITOT 0.4  PROT 6.3*  ALBUMIN 3.1*   No results for input(s): LIPASE, AMYLASE in the last 168 hours. No results for input(s): AMMONIA in the last 168 hours. CBC: Recent Labs  Lab 06/29/18 1719 06/30/18 0451 07/02/18 0443  WBC 7.2 7.9 7.0  NEUTROABS 4.6  --  4.2  HGB 7.8* 7.9* 8.3*  HCT 26.6* 25.2* 27.6*  MCV 90.8 87.2 90.5  PLT 353 298 308   Cardiac Enzymes: No results for input(s): CKTOTAL, CKMB, CKMBINDEX, TROPONINI in the last 168 hours. BNP: BNP (last 3 results) Recent Labs    06/29/18 1719  BNP 66.1    ProBNP (last 3 results) No results for input(s): PROBNP in the last 8760 hours.  CBG: No results for input(s): GLUCAP in the last 168 hours.     SignedMonica Becton MD.  [facility] 07/04/2018, 2:27 PM

## 2018-07-04 NOTE — Progress Notes (Signed)
Edwin Dada to be D/C'd  per MD order. Discussed with the patient and all questions fully answered.  IV catheter discontinued intact. Site without signs and symptoms of complications. Dressing and pressure applied.  An After Visit Summary was printed and given to the patient. Patient received prescription.  D/c education completed with patient/family including follow up instructions, medication list, d/c activities limitations if indicated, with other d/c instructions as indicated by MD - patient able to verbalize understanding, all questions fully answered.   Patient instructed to return to ED, call 911, or call MD for any changes in condition.   Patient to be escorted via [place], and D/C home via private auto.

## 2018-07-04 NOTE — Progress Notes (Signed)
[facility]) RN Note @ [phone]  Plan is for patient to discharge home today.  Please send signed and completed DNR form home with patient/family. Patient will need prescriptions for comfort medications.  Please notify [facility] when patient ready to leave the unit (call [phone]). Completed discharge summary will need to be faxed to [phone].   Please call with any hospice related questions or concerns.  Thank you, Margaretmary Eddy, RN, [facility] Liaison [phone]  [place] are on AMION.

## 2018-07-04 NOTE — Progress Notes (Signed)
Pharmacy Antibiotic Note  Shelby Reese is a 83 y.o. female admitted on 06/29/2018 with cellulitis/nonhealing wounds.  Pharmacy has been consulted for Vancomycin / Cefepime  dosing.  Day #4 of abx for pseudomonas and GPC non healing L foot wound. Patient already has a R AKA. Afebrile, WBC wnl. Currently refusing amputation.  Plan: Continue vancomycin 750 mg IV Q24h Continue cefepime 1 g IV Q24h  Monitor clinical picture, renal function, VT prn F/U C&S, discharge on cipro soon? Stop vancomycin soon? GPC probably just skin flora since cx is superficial   Height: 5\' 6"  (167.6 cm) Weight: 180 lb 1.9 oz (81.7 kg) IBW/kg (Calculated) : 59.3  Temp (24hrs), Avg:98.2 F (36.8 C), Min:97.5 F (36.4 C), Max:98.8 F (37.1 C)  Recent Labs  Lab 06/29/18 1719 06/30/18 0451 07/02/18 0443  WBC 7.2 7.9 7.0  CREATININE 1.44* 1.47* 1.23*    Estimated Creatinine Clearance: 39.3 mL/min (A) (by C-G formula based on SCr of 1.23 mg/dL (H)).    Allergies  Allergen Reactions  . Cinnamon Hives  . Ciprofloxacin Other (See Comments)    TREMORS  . Diovan [Valsartan] Other (See Comments)    Extreme vertigo  . Food Diarrhea and Other (See Comments)    ORANGE JUICE   UPSET STOMACH  . Latex Rash and Other (See Comments)    Rash/inflammation due to exposure  . Nitrofuran Derivatives Hives and Rash    "Full body rash"  . Penicillins Hives and Swelling    *tolerated Ceftriaxone September 2018 Has patient had a PCN reaction causing immediate rash, facial/tongue/throat swelling, SOB or lightheadedness with hypotension:No--severe irritation at the injection site Has patient had a PCN reaction causing severe rash involving mucus membranes or skin necrosis:Unknown Has patient had a PCN reaction that required hospitalization:No Has patient had a PCN reaction occurring within the last 10 years:Yes If all of the above answers are "NO", then may proceed with  . Bactrim [Sulfamethoxazole-Trimethoprim] Diarrhea and  Nausea Only  . Other Rash    Mycins, Strawberry, Oranges  . Sulfa Antibiotics Diarrhea and Nausea Only    Elenor Quinones, PharmD, [facility] Clinical Pharmacist Phone number [phone] 07/04/2018 8:22 AM

## 2018-07-05 DIAGNOSIS — K922 Gastrointestinal hemorrhage, unspecified: Secondary | ICD-10-CM | POA: Diagnosis not present

## 2018-07-05 DIAGNOSIS — J301 Allergic rhinitis due to pollen: Secondary | ICD-10-CM | POA: Diagnosis not present

## 2018-07-05 DIAGNOSIS — K219 Gastro-esophageal reflux disease without esophagitis: Secondary | ICD-10-CM | POA: Diagnosis not present

## 2018-07-05 DIAGNOSIS — N183 Chronic kidney disease, stage 3 (moderate): Secondary | ICD-10-CM | POA: Diagnosis not present

## 2018-07-05 DIAGNOSIS — I7090 Unspecified atherosclerosis: Secondary | ICD-10-CM | POA: Diagnosis not present

## 2018-07-05 DIAGNOSIS — L97923 Non-pressure chronic ulcer of unspecified part of left lower leg with necrosis of muscle: Secondary | ICD-10-CM | POA: Diagnosis not present

## 2018-07-05 DIAGNOSIS — I10 Essential (primary) hypertension: Secondary | ICD-10-CM | POA: Diagnosis not present

## 2018-07-05 DIAGNOSIS — I70209 Unspecified atherosclerosis of native arteries of extremities, unspecified extremity: Secondary | ICD-10-CM | POA: Diagnosis not present

## 2018-07-05 DIAGNOSIS — E039 Hypothyroidism, unspecified: Secondary | ICD-10-CM | POA: Diagnosis not present

## 2018-07-05 DIAGNOSIS — D500 Iron deficiency anemia secondary to blood loss (chronic): Secondary | ICD-10-CM | POA: Diagnosis not present

## 2018-07-06 DIAGNOSIS — I70209 Unspecified atherosclerosis of native arteries of extremities, unspecified extremity: Secondary | ICD-10-CM | POA: Diagnosis not present

## 2018-07-06 DIAGNOSIS — K922 Gastrointestinal hemorrhage, unspecified: Secondary | ICD-10-CM | POA: Diagnosis not present

## 2018-07-06 DIAGNOSIS — I10 Essential (primary) hypertension: Secondary | ICD-10-CM | POA: Diagnosis not present

## 2018-07-06 DIAGNOSIS — D500 Iron deficiency anemia secondary to blood loss (chronic): Secondary | ICD-10-CM | POA: Diagnosis not present

## 2018-07-06 DIAGNOSIS — I7090 Unspecified atherosclerosis: Secondary | ICD-10-CM | POA: Diagnosis not present

## 2018-07-06 DIAGNOSIS — L97923 Non-pressure chronic ulcer of unspecified part of left lower leg with necrosis of muscle: Secondary | ICD-10-CM | POA: Diagnosis not present

## 2018-07-07 ENCOUNTER — Encounter (HOSPITAL_BASED_OUTPATIENT_CLINIC_OR_DEPARTMENT_OTHER): Attending: Internal Medicine

## 2018-07-07 ENCOUNTER — Other Ambulatory Visit: Payer: Self-pay | Admitting: *Deleted

## 2018-07-07 DIAGNOSIS — E039 Hypothyroidism, unspecified: Secondary | ICD-10-CM | POA: Insufficient documentation

## 2018-07-07 DIAGNOSIS — I10 Essential (primary) hypertension: Secondary | ICD-10-CM | POA: Diagnosis not present

## 2018-07-07 DIAGNOSIS — I70245 Atherosclerosis of native arteries of left leg with ulceration of other part of foot: Secondary | ICD-10-CM | POA: Insufficient documentation

## 2018-07-07 DIAGNOSIS — I129 Hypertensive chronic kidney disease with stage 1 through stage 4 chronic kidney disease, or unspecified chronic kidney disease: Secondary | ICD-10-CM | POA: Diagnosis not present

## 2018-07-07 DIAGNOSIS — L97221 Non-pressure chronic ulcer of left calf limited to breakdown of skin: Secondary | ICD-10-CM | POA: Diagnosis not present

## 2018-07-07 DIAGNOSIS — Z89611 Acquired absence of right leg above knee: Secondary | ICD-10-CM | POA: Insufficient documentation

## 2018-07-07 DIAGNOSIS — L97923 Non-pressure chronic ulcer of unspecified part of left lower leg with necrosis of muscle: Secondary | ICD-10-CM | POA: Diagnosis not present

## 2018-07-07 DIAGNOSIS — L97525 Non-pressure chronic ulcer of other part of left foot with muscle involvement without evidence of necrosis: Secondary | ICD-10-CM | POA: Diagnosis not present

## 2018-07-07 DIAGNOSIS — L89893 Pressure ulcer of other site, stage 3: Secondary | ICD-10-CM | POA: Diagnosis not present

## 2018-07-07 DIAGNOSIS — Z87891 Personal history of nicotine dependence: Secondary | ICD-10-CM | POA: Insufficient documentation

## 2018-07-07 DIAGNOSIS — L97523 Non-pressure chronic ulcer of other part of left foot with necrosis of muscle: Secondary | ICD-10-CM | POA: Diagnosis not present

## 2018-07-07 DIAGNOSIS — B965 Pseudomonas (aeruginosa) (mallei) (pseudomallei) as the cause of diseases classified elsewhere: Secondary | ICD-10-CM | POA: Insufficient documentation

## 2018-07-07 DIAGNOSIS — Z95820 Peripheral vascular angioplasty status with implants and grafts: Secondary | ICD-10-CM | POA: Diagnosis not present

## 2018-07-07 DIAGNOSIS — I7090 Unspecified atherosclerosis: Secondary | ICD-10-CM | POA: Diagnosis not present

## 2018-07-07 DIAGNOSIS — K922 Gastrointestinal hemorrhage, unspecified: Secondary | ICD-10-CM | POA: Diagnosis not present

## 2018-07-07 DIAGNOSIS — N183 Chronic kidney disease, stage 3 (moderate): Secondary | ICD-10-CM | POA: Insufficient documentation

## 2018-07-07 DIAGNOSIS — D500 Iron deficiency anemia secondary to blood loss (chronic): Secondary | ICD-10-CM | POA: Diagnosis not present

## 2018-07-07 DIAGNOSIS — I70209 Unspecified atherosclerosis of native arteries of extremities, unspecified extremity: Secondary | ICD-10-CM | POA: Diagnosis not present

## 2018-07-07 NOTE — Patient Outreach (Signed)
Transition of care call. Pt has been in the hospital for anemia and infected LLE wound. She was discharged over the weekend home with Hospice care. I have called today to request a home visit to see her and close her case, however, no one answered the phone. I will call again later or tomorrow.  Eulah Pont. Myrtie Neither, MSN, [facility] Gerontological Nurse Practitioner [facility] Care Management [phone]

## 2018-07-09 DIAGNOSIS — I10 Essential (primary) hypertension: Secondary | ICD-10-CM | POA: Diagnosis not present

## 2018-07-09 DIAGNOSIS — K922 Gastrointestinal hemorrhage, unspecified: Secondary | ICD-10-CM | POA: Diagnosis not present

## 2018-07-09 DIAGNOSIS — L97923 Non-pressure chronic ulcer of unspecified part of left lower leg with necrosis of muscle: Secondary | ICD-10-CM | POA: Diagnosis not present

## 2018-07-09 DIAGNOSIS — D500 Iron deficiency anemia secondary to blood loss (chronic): Secondary | ICD-10-CM | POA: Diagnosis not present

## 2018-07-09 DIAGNOSIS — I70209 Unspecified atherosclerosis of native arteries of extremities, unspecified extremity: Secondary | ICD-10-CM | POA: Diagnosis not present

## 2018-07-09 DIAGNOSIS — I7090 Unspecified atherosclerosis: Secondary | ICD-10-CM | POA: Diagnosis not present

## 2018-07-11 DIAGNOSIS — I70209 Unspecified atherosclerosis of native arteries of extremities, unspecified extremity: Secondary | ICD-10-CM | POA: Diagnosis not present

## 2018-07-11 DIAGNOSIS — K922 Gastrointestinal hemorrhage, unspecified: Secondary | ICD-10-CM | POA: Diagnosis not present

## 2018-07-11 DIAGNOSIS — I7090 Unspecified atherosclerosis: Secondary | ICD-10-CM | POA: Diagnosis not present

## 2018-07-11 DIAGNOSIS — L97923 Non-pressure chronic ulcer of unspecified part of left lower leg with necrosis of muscle: Secondary | ICD-10-CM | POA: Diagnosis not present

## 2018-07-11 DIAGNOSIS — D500 Iron deficiency anemia secondary to blood loss (chronic): Secondary | ICD-10-CM | POA: Diagnosis not present

## 2018-07-11 DIAGNOSIS — I10 Essential (primary) hypertension: Secondary | ICD-10-CM | POA: Diagnosis not present

## 2018-07-13 DIAGNOSIS — I7090 Unspecified atherosclerosis: Secondary | ICD-10-CM | POA: Diagnosis not present

## 2018-07-13 DIAGNOSIS — I70209 Unspecified atherosclerosis of native arteries of extremities, unspecified extremity: Secondary | ICD-10-CM | POA: Diagnosis not present

## 2018-07-13 DIAGNOSIS — K922 Gastrointestinal hemorrhage, unspecified: Secondary | ICD-10-CM | POA: Diagnosis not present

## 2018-07-13 DIAGNOSIS — D500 Iron deficiency anemia secondary to blood loss (chronic): Secondary | ICD-10-CM | POA: Diagnosis not present

## 2018-07-13 DIAGNOSIS — L97923 Non-pressure chronic ulcer of unspecified part of left lower leg with necrosis of muscle: Secondary | ICD-10-CM | POA: Diagnosis not present

## 2018-07-13 DIAGNOSIS — I10 Essential (primary) hypertension: Secondary | ICD-10-CM | POA: Diagnosis not present

## 2018-07-15 ENCOUNTER — Other Ambulatory Visit: Payer: Self-pay | Admitting: *Deleted

## 2018-07-15 DIAGNOSIS — I70209 Unspecified atherosclerosis of native arteries of extremities, unspecified extremity: Secondary | ICD-10-CM | POA: Diagnosis not present

## 2018-07-15 DIAGNOSIS — I7090 Unspecified atherosclerosis: Secondary | ICD-10-CM | POA: Diagnosis not present

## 2018-07-15 DIAGNOSIS — L97923 Non-pressure chronic ulcer of unspecified part of left lower leg with necrosis of muscle: Secondary | ICD-10-CM | POA: Diagnosis not present

## 2018-07-15 DIAGNOSIS — D500 Iron deficiency anemia secondary to blood loss (chronic): Secondary | ICD-10-CM | POA: Diagnosis not present

## 2018-07-15 DIAGNOSIS — I10 Essential (primary) hypertension: Secondary | ICD-10-CM | POA: Diagnosis not present

## 2018-07-15 DIAGNOSIS — K922 Gastrointestinal hemorrhage, unspecified: Secondary | ICD-10-CM | POA: Diagnosis not present

## 2018-07-16 ENCOUNTER — Other Ambulatory Visit: Payer: Self-pay | Admitting: *Deleted

## 2018-07-17 ENCOUNTER — Encounter: Payer: Self-pay | Admitting: *Deleted

## 2018-07-17 DIAGNOSIS — I7090 Unspecified atherosclerosis: Secondary | ICD-10-CM | POA: Diagnosis not present

## 2018-07-17 DIAGNOSIS — D500 Iron deficiency anemia secondary to blood loss (chronic): Secondary | ICD-10-CM | POA: Diagnosis not present

## 2018-07-17 DIAGNOSIS — L97923 Non-pressure chronic ulcer of unspecified part of left lower leg with necrosis of muscle: Secondary | ICD-10-CM | POA: Diagnosis not present

## 2018-07-17 DIAGNOSIS — I10 Essential (primary) hypertension: Secondary | ICD-10-CM | POA: Diagnosis not present

## 2018-07-17 DIAGNOSIS — K922 Gastrointestinal hemorrhage, unspecified: Secondary | ICD-10-CM | POA: Diagnosis not present

## 2018-07-17 DIAGNOSIS — I70209 Unspecified atherosclerosis of native arteries of extremities, unspecified extremity: Secondary | ICD-10-CM | POA: Diagnosis not present

## 2018-07-17 NOTE — Patient Outreach (Signed)
[place] [facility]) Care Management  07/17/2018  Shelby Reese 1935/03/26 [id number]   Case Closure Home Visit. Shelby Reese was in the hospital recently and has decided not to have any more aggressive measures on her LLE which was determined to have gangrenous, necrotic wounds. She also had anemia of CKD. She left the hosptial with a Hospice referral.  Hospice has been out to establish care. She is very happy and content with this decision and this support.  Pt is in wheelchair and she is able to move around her home in this. Her husband is present. She is in good spirits despite her predicament. Her worries are mostly what will happen to her husband when she dies as he has mild to moderate dementia but is still highly functional with guidance and he is still able to drive.  She reports she is pain free at present. She reports she has days like this and others she has extreme pain, like yesterday she was a 10/10 for most of the day. She is out of her pain medication and as placed a request with Hospice for a new Rx.  O:  BP (!) 148/68   Pulse 68   Resp 18   SpO2 98%        RRR with murmur       Lungs are clear.       Extremities: LAKA                           RLE with gauze dressing in place. There is considerable edema to the knee. I note some rubra discoloration to the skin above the dressing. I can see some breakthrough bleeding, a very small area, on the lateral aspect of the dressing.  A:  PVD, gangrenous wound to RLE.      Anxiety about husband with her anticipated decline.  P:  I will reach out to Dr. Shelia Media and request him to call pt and advise he is referring Shelby Reese to me for Chronic Disease Management and advancing dementia. Shelby Reese is in denial about his condition and gets defensive if someone insinuates that he is not       functioning at full mental capacity. I will perform cognitive testing and work with him so he can accept his diagnosis and make plans for the  future.  I will be closing Shelby Reese case as she is under Hospice care at this time. I will be talking with her periodically for support and of course, if I am seeing her husband.  Patient was recently discharged from hospital and all medications have been reviewed.  Outpatient Encounter Medications as of 07/16/2018  Medication Sig Note  . aspirin EC 81 MG tablet Take 81 mg by mouth every evening.    . Calcium Carbonate-Vitamin D (CALCIUM 600+D) 600-400 MG-UNIT per tablet Take 1 tablet by mouth every evening.    . cetirizine (ZYRTEC) 10 MG tablet Take 10 mg by mouth at bedtime.    Marland Kitchen diltiazem (TIAZAC) 120 MG 24 hr capsule HOLD until seen by your PCP (Patient taking differently: Take 120 mg by mouth daily. )   . diphenhydrAMINE (BENADRYL) 25 mg capsule Take 25 mg by mouth 3 (three) times daily.    . famotidine (PEPCID) 20 MG tablet Take 2 tablets (40 mg total) by mouth 2 (two) times daily.   . ferrous sulfate 325 (65 FE) MG tablet Take 487.5 mg by mouth every  evening.    Marland Kitchen FIBER PO Take 1 capsule by mouth 2 (two) times daily.   Marland Kitchen gabapentin (NEURONTIN) 100 MG capsule Take 200 mg by mouth 2 (two) times daily.    Marland Kitchen glucosamine-chondroitin 500-400 MG tablet Take 1 tablet by mouth every evening.  06/29/2018: Pt is trying to decide if she is going to resume or not  . ibuprofen (ADVIL,MOTRIN) 200 MG tablet Take 400 mg by mouth daily.   Marland Kitchen levothyroxine (SYNTHROID, LEVOTHROID) 175 MCG tablet Take 175 mcg by mouth daily before breakfast.   . Magnesium 125 MG CAPS Take 125 mg by mouth every evening.    . Multiple Vitamins-Minerals (MULTIVITAMIN WITH MINERALS) tablet Take 1 tablet by mouth every evening.    . nystatin-triamcinolone (MYCOLOG II) cream Apply 1 application topically See admin instructions. Apply topically to left leg every afternoon for wound care   . pantoprazole (PROTONIX) 40 MG tablet Take 1 tablet (40 mg total) by mouth daily. (Patient not taking: Reported on 06/29/2018)   . vitamin C  (ASCORBIC ACID) 500 MG tablet Take 500 mg by mouth every evening.     No facility-administered encounter medications on file as of 07/16/2018.    THN CM Care Plan Problem One     Most Recent Value  Care Plan Problem One  High Risk for Readmission  Role Documenting the Problem One  Care Management [place] for Problem One  Not Active  THN Long Term Goal   Pt will not readmit within 31 days for same reasons.  THN Long Term Goal Start Date  03/23/18  Interventions for Problem One Long Term Goal  Pt discharged from hospital under Hospice care. Today I'm officially closing her case but will remain in contact for support telephonically and will be asking for a referral for her husband to establish a plan of care for him when pt expires.  THN CM Short Term Goal #1   Home visit to be completed this week.  THN CM Short Term Goal #1 Start Date  03/23/18  THN CM Short Term Goal #1 Met Date  04/02/18  THN CM Short Term Goal #2   Pt will call NP over next 30 days for any problems that may indicate a health complication for medical advice.  THN CM Short Term Goal #2 Start Date  03/23/18  [facility] CM Short Term Goal #2 Met Date  04/17/18  THN CM Short Term Goal #3  Patient will contact facilities of interest to see if they are willing to make bed offers, within the next week.  THN CM Short Term Goal #3 Start Date  04/17/18  [facility] CM Short Term Goal #3 Met Date  04/22/18  [facility] CM Short Term Goal #4  Patient will provide CSW with another list of facilities that she may be interested in for placement, within the next 48 hours, as none of the facilities that we have faxed patient's FL-2 Form to are making bed offers.  THN CM Short Term Goal #4 Start Date  04/22/18  [facility] CM Short Term Goal #4 Met Date  04/24/18  Interventions for Short Term Goal #4  CSW has agreed to fax patient's FL-2 Form toadditional facilities of interest.    [facility] CM Care Plan Problem Two     Most Recent Value  Care Plan Problem Two   Pt is caregiver for her husband.  Role Documenting the Problem Two  Care Management Coordinator  Care Plan for Problem Two  Not  Active  Interventions for Problem Two Long Term Goal   Will request husbands primary care MD (same as pts) to send [facility] a referral for CCM and plan of care.  THN Long Term Goal  We will devise a plan "B" for alternate living solution of the couple or her husband should she not be able to provide his oversight.  THN Long Term Goal Start Date  03/23/18      Eulah Pont. Myrtie Neither, MSN, [facility] Gerontological Nurse Practitioner [facility] Care Management [phone]

## 2018-07-17 NOTE — Patient Outreach (Signed)
Telephone assessment. I was able to speak with Mrs. Empie today and arrange for a case closure home visit as pt is now on Hospice services. I will see her on Thursday, 07/16/18.  Eulah Pont. Myrtie Neither, MSN, [facility] Gerontological Nurse Practitioner [facility] Care Management [phone]

## 2018-07-19 DIAGNOSIS — I10 Essential (primary) hypertension: Secondary | ICD-10-CM | POA: Diagnosis not present

## 2018-07-19 DIAGNOSIS — D500 Iron deficiency anemia secondary to blood loss (chronic): Secondary | ICD-10-CM | POA: Diagnosis not present

## 2018-07-19 DIAGNOSIS — L97923 Non-pressure chronic ulcer of unspecified part of left lower leg with necrosis of muscle: Secondary | ICD-10-CM | POA: Diagnosis not present

## 2018-07-19 DIAGNOSIS — K922 Gastrointestinal hemorrhage, unspecified: Secondary | ICD-10-CM | POA: Diagnosis not present

## 2018-07-19 DIAGNOSIS — I70209 Unspecified atherosclerosis of native arteries of extremities, unspecified extremity: Secondary | ICD-10-CM | POA: Diagnosis not present

## 2018-07-19 DIAGNOSIS — I7090 Unspecified atherosclerosis: Secondary | ICD-10-CM | POA: Diagnosis not present

## 2018-07-19 DIAGNOSIS — J301 Allergic rhinitis due to pollen: Secondary | ICD-10-CM | POA: Diagnosis not present

## 2018-07-19 DIAGNOSIS — N183 Chronic kidney disease, stage 3 (moderate): Secondary | ICD-10-CM | POA: Diagnosis not present

## 2018-07-19 DIAGNOSIS — K219 Gastro-esophageal reflux disease without esophagitis: Secondary | ICD-10-CM | POA: Diagnosis not present

## 2018-07-19 DIAGNOSIS — E039 Hypothyroidism, unspecified: Secondary | ICD-10-CM | POA: Diagnosis not present

## 2018-07-20 DIAGNOSIS — L97923 Non-pressure chronic ulcer of unspecified part of left lower leg with necrosis of muscle: Secondary | ICD-10-CM | POA: Diagnosis not present

## 2018-07-20 DIAGNOSIS — I7090 Unspecified atherosclerosis: Secondary | ICD-10-CM | POA: Diagnosis not present

## 2018-07-20 DIAGNOSIS — I70209 Unspecified atherosclerosis of native arteries of extremities, unspecified extremity: Secondary | ICD-10-CM | POA: Diagnosis not present

## 2018-07-20 DIAGNOSIS — D500 Iron deficiency anemia secondary to blood loss (chronic): Secondary | ICD-10-CM | POA: Diagnosis not present

## 2018-07-20 DIAGNOSIS — I10 Essential (primary) hypertension: Secondary | ICD-10-CM | POA: Diagnosis not present

## 2018-07-20 DIAGNOSIS — K922 Gastrointestinal hemorrhage, unspecified: Secondary | ICD-10-CM | POA: Diagnosis not present

## 2018-07-21 ENCOUNTER — Encounter (HOSPITAL_BASED_OUTPATIENT_CLINIC_OR_DEPARTMENT_OTHER): Payer: Medicare Other | Attending: Internal Medicine

## 2018-07-21 DIAGNOSIS — B9562 Methicillin resistant Staphylococcus aureus infection as the cause of diseases classified elsewhere: Secondary | ICD-10-CM | POA: Insufficient documentation

## 2018-07-21 DIAGNOSIS — N183 Chronic kidney disease, stage 3 (moderate): Secondary | ICD-10-CM | POA: Diagnosis not present

## 2018-07-21 DIAGNOSIS — I70245 Atherosclerosis of native arteries of left leg with ulceration of other part of foot: Secondary | ICD-10-CM | POA: Diagnosis not present

## 2018-07-21 DIAGNOSIS — Z87891 Personal history of nicotine dependence: Secondary | ICD-10-CM | POA: Diagnosis not present

## 2018-07-21 DIAGNOSIS — L97528 Non-pressure chronic ulcer of other part of left foot with other specified severity: Secondary | ICD-10-CM | POA: Insufficient documentation

## 2018-07-21 DIAGNOSIS — I129 Hypertensive chronic kidney disease with stage 1 through stage 4 chronic kidney disease, or unspecified chronic kidney disease: Secondary | ICD-10-CM | POA: Diagnosis not present

## 2018-07-21 DIAGNOSIS — Z89611 Acquired absence of right leg above knee: Secondary | ICD-10-CM | POA: Diagnosis not present

## 2018-07-21 DIAGNOSIS — L97522 Non-pressure chronic ulcer of other part of left foot with fat layer exposed: Secondary | ICD-10-CM | POA: Diagnosis not present

## 2018-07-21 DIAGNOSIS — L97222 Non-pressure chronic ulcer of left calf with fat layer exposed: Secondary | ICD-10-CM | POA: Insufficient documentation

## 2018-07-21 DIAGNOSIS — B965 Pseudomonas (aeruginosa) (mallei) (pseudomallei) as the cause of diseases classified elsewhere: Secondary | ICD-10-CM | POA: Insufficient documentation

## 2018-07-21 DIAGNOSIS — L97523 Non-pressure chronic ulcer of other part of left foot with necrosis of muscle: Secondary | ICD-10-CM | POA: Diagnosis not present

## 2018-07-21 DIAGNOSIS — I70242 Atherosclerosis of native arteries of left leg with ulceration of calf: Secondary | ICD-10-CM | POA: Insufficient documentation

## 2018-07-22 DIAGNOSIS — L89893 Pressure ulcer of other site, stage 3: Secondary | ICD-10-CM | POA: Diagnosis not present

## 2018-07-22 DIAGNOSIS — L97523 Non-pressure chronic ulcer of other part of left foot with necrosis of muscle: Secondary | ICD-10-CM | POA: Diagnosis not present

## 2018-07-22 DIAGNOSIS — I70245 Atherosclerosis of native arteries of left leg with ulceration of other part of foot: Secondary | ICD-10-CM | POA: Diagnosis not present

## 2018-07-22 DIAGNOSIS — Z89611 Acquired absence of right leg above knee: Secondary | ICD-10-CM | POA: Diagnosis not present

## 2018-07-22 DIAGNOSIS — I10 Essential (primary) hypertension: Secondary | ICD-10-CM | POA: Diagnosis not present

## 2018-07-22 DIAGNOSIS — L89152 Pressure ulcer of sacral region, stage 2: Secondary | ICD-10-CM | POA: Diagnosis not present

## 2018-07-28 DIAGNOSIS — L97522 Non-pressure chronic ulcer of other part of left foot with fat layer exposed: Secondary | ICD-10-CM | POA: Diagnosis not present

## 2018-07-28 DIAGNOSIS — I70245 Atherosclerosis of native arteries of left leg with ulceration of other part of foot: Secondary | ICD-10-CM | POA: Diagnosis not present

## 2018-07-28 DIAGNOSIS — I70242 Atherosclerosis of native arteries of left leg with ulceration of calf: Secondary | ICD-10-CM | POA: Diagnosis not present

## 2018-07-28 DIAGNOSIS — L97523 Non-pressure chronic ulcer of other part of left foot with necrosis of muscle: Secondary | ICD-10-CM | POA: Diagnosis not present

## 2018-07-28 DIAGNOSIS — B9562 Methicillin resistant Staphylococcus aureus infection as the cause of diseases classified elsewhere: Secondary | ICD-10-CM | POA: Diagnosis not present

## 2018-07-28 DIAGNOSIS — L97222 Non-pressure chronic ulcer of left calf with fat layer exposed: Secondary | ICD-10-CM | POA: Diagnosis not present

## 2018-07-28 DIAGNOSIS — L97528 Non-pressure chronic ulcer of other part of left foot with other specified severity: Secondary | ICD-10-CM | POA: Diagnosis not present

## 2018-07-28 DIAGNOSIS — L97525 Non-pressure chronic ulcer of other part of left foot with muscle involvement without evidence of necrosis: Secondary | ICD-10-CM | POA: Diagnosis not present

## 2018-07-30 DIAGNOSIS — I10 Essential (primary) hypertension: Secondary | ICD-10-CM | POA: Diagnosis not present

## 2018-07-30 DIAGNOSIS — Z89611 Acquired absence of right leg above knee: Secondary | ICD-10-CM | POA: Diagnosis not present

## 2018-07-30 DIAGNOSIS — L89893 Pressure ulcer of other site, stage 3: Secondary | ICD-10-CM | POA: Diagnosis not present

## 2018-07-30 DIAGNOSIS — R350 Frequency of micturition: Secondary | ICD-10-CM | POA: Diagnosis not present

## 2018-07-30 DIAGNOSIS — L89152 Pressure ulcer of sacral region, stage 2: Secondary | ICD-10-CM | POA: Diagnosis not present

## 2018-07-30 DIAGNOSIS — N183 Chronic kidney disease, stage 3 (moderate): Secondary | ICD-10-CM | POA: Diagnosis not present

## 2018-07-30 DIAGNOSIS — I70245 Atherosclerosis of native arteries of left leg with ulceration of other part of foot: Secondary | ICD-10-CM | POA: Diagnosis not present

## 2018-07-30 DIAGNOSIS — L97523 Non-pressure chronic ulcer of other part of left foot with necrosis of muscle: Secondary | ICD-10-CM | POA: Diagnosis not present

## 2018-07-30 DIAGNOSIS — R6883 Chills (without fever): Secondary | ICD-10-CM | POA: Diagnosis not present

## 2018-07-31 DIAGNOSIS — N390 Urinary tract infection, site not specified: Secondary | ICD-10-CM | POA: Diagnosis not present

## 2018-08-01 DIAGNOSIS — L89152 Pressure ulcer of sacral region, stage 2: Secondary | ICD-10-CM | POA: Diagnosis not present

## 2018-08-01 DIAGNOSIS — Z89611 Acquired absence of right leg above knee: Secondary | ICD-10-CM | POA: Diagnosis not present

## 2018-08-01 DIAGNOSIS — L97523 Non-pressure chronic ulcer of other part of left foot with necrosis of muscle: Secondary | ICD-10-CM | POA: Diagnosis not present

## 2018-08-01 DIAGNOSIS — I70245 Atherosclerosis of native arteries of left leg with ulceration of other part of foot: Secondary | ICD-10-CM | POA: Diagnosis not present

## 2018-08-01 DIAGNOSIS — L89893 Pressure ulcer of other site, stage 3: Secondary | ICD-10-CM | POA: Diagnosis not present

## 2018-08-01 DIAGNOSIS — I10 Essential (primary) hypertension: Secondary | ICD-10-CM | POA: Diagnosis not present

## 2018-08-03 DIAGNOSIS — Z89611 Acquired absence of right leg above knee: Secondary | ICD-10-CM | POA: Diagnosis not present

## 2018-08-03 DIAGNOSIS — L89152 Pressure ulcer of sacral region, stage 2: Secondary | ICD-10-CM | POA: Diagnosis not present

## 2018-08-03 DIAGNOSIS — L97523 Non-pressure chronic ulcer of other part of left foot with necrosis of muscle: Secondary | ICD-10-CM | POA: Diagnosis not present

## 2018-08-03 DIAGNOSIS — I70245 Atherosclerosis of native arteries of left leg with ulceration of other part of foot: Secondary | ICD-10-CM | POA: Diagnosis not present

## 2018-08-03 DIAGNOSIS — I10 Essential (primary) hypertension: Secondary | ICD-10-CM | POA: Diagnosis not present

## 2018-08-03 DIAGNOSIS — L89893 Pressure ulcer of other site, stage 3: Secondary | ICD-10-CM | POA: Diagnosis not present

## 2018-08-05 ENCOUNTER — Other Ambulatory Visit (HOSPITAL_COMMUNITY)
Admission: RE | Admit: 2018-08-05 | Discharge: 2018-08-05 | Disposition: A | Discharge status: Home / Self Care | Payer: Medicare Other | Source: Other Acute Inpatient Hospital | Attending: Physician Assistant | Admitting: Physician Assistant

## 2018-08-05 DIAGNOSIS — L97221 Non-pressure chronic ulcer of left calf limited to breakdown of skin: Secondary | ICD-10-CM | POA: Insufficient documentation

## 2018-08-05 DIAGNOSIS — L97525 Non-pressure chronic ulcer of other part of left foot with muscle involvement without evidence of necrosis: Secondary | ICD-10-CM | POA: Diagnosis not present

## 2018-08-05 DIAGNOSIS — I70242 Atherosclerosis of native arteries of left leg with ulceration of calf: Secondary | ICD-10-CM | POA: Diagnosis not present

## 2018-08-05 DIAGNOSIS — L97522 Non-pressure chronic ulcer of other part of left foot with fat layer exposed: Secondary | ICD-10-CM | POA: Diagnosis not present

## 2018-08-05 DIAGNOSIS — L97222 Non-pressure chronic ulcer of left calf with fat layer exposed: Secondary | ICD-10-CM | POA: Diagnosis not present

## 2018-08-05 DIAGNOSIS — L97528 Non-pressure chronic ulcer of other part of left foot with other specified severity: Secondary | ICD-10-CM | POA: Diagnosis not present

## 2018-08-05 DIAGNOSIS — I70245 Atherosclerosis of native arteries of left leg with ulceration of other part of foot: Secondary | ICD-10-CM | POA: Diagnosis not present

## 2018-08-05 DIAGNOSIS — L89893 Pressure ulcer of other site, stage 3: Secondary | ICD-10-CM | POA: Diagnosis not present

## 2018-08-05 DIAGNOSIS — B9562 Methicillin resistant Staphylococcus aureus infection as the cause of diseases classified elsewhere: Secondary | ICD-10-CM | POA: Diagnosis not present

## 2018-08-07 DIAGNOSIS — L89893 Pressure ulcer of other site, stage 3: Secondary | ICD-10-CM | POA: Diagnosis not present

## 2018-08-07 DIAGNOSIS — L97523 Non-pressure chronic ulcer of other part of left foot with necrosis of muscle: Secondary | ICD-10-CM | POA: Diagnosis not present

## 2018-08-07 DIAGNOSIS — I70245 Atherosclerosis of native arteries of left leg with ulceration of other part of foot: Secondary | ICD-10-CM | POA: Diagnosis not present

## 2018-08-07 DIAGNOSIS — Z89611 Acquired absence of right leg above knee: Secondary | ICD-10-CM | POA: Diagnosis not present

## 2018-08-07 DIAGNOSIS — I10 Essential (primary) hypertension: Secondary | ICD-10-CM | POA: Diagnosis not present

## 2018-08-07 DIAGNOSIS — L89152 Pressure ulcer of sacral region, stage 2: Secondary | ICD-10-CM | POA: Diagnosis not present

## 2018-08-08 LAB — AEROBIC CULTURE  (SUPERFICIAL SPECIMEN): GRAM STAIN: NONE SEEN

## 2018-08-08 LAB — AEROBIC CULTURE W GRAM STAIN (SUPERFICIAL SPECIMEN)

## 2018-08-10 DIAGNOSIS — L97523 Non-pressure chronic ulcer of other part of left foot with necrosis of muscle: Secondary | ICD-10-CM | POA: Diagnosis not present

## 2018-08-10 DIAGNOSIS — I10 Essential (primary) hypertension: Secondary | ICD-10-CM | POA: Diagnosis not present

## 2018-08-10 DIAGNOSIS — L89893 Pressure ulcer of other site, stage 3: Secondary | ICD-10-CM | POA: Diagnosis not present

## 2018-08-10 DIAGNOSIS — L89152 Pressure ulcer of sacral region, stage 2: Secondary | ICD-10-CM | POA: Diagnosis not present

## 2018-08-10 DIAGNOSIS — Z89611 Acquired absence of right leg above knee: Secondary | ICD-10-CM | POA: Diagnosis not present

## 2018-08-10 DIAGNOSIS — I70245 Atherosclerosis of native arteries of left leg with ulceration of other part of foot: Secondary | ICD-10-CM | POA: Diagnosis not present

## 2018-08-12 DIAGNOSIS — I70245 Atherosclerosis of native arteries of left leg with ulceration of other part of foot: Secondary | ICD-10-CM | POA: Diagnosis not present

## 2018-08-12 DIAGNOSIS — Z89611 Acquired absence of right leg above knee: Secondary | ICD-10-CM | POA: Diagnosis not present

## 2018-08-12 DIAGNOSIS — L97523 Non-pressure chronic ulcer of other part of left foot with necrosis of muscle: Secondary | ICD-10-CM | POA: Diagnosis not present

## 2018-08-12 DIAGNOSIS — L89893 Pressure ulcer of other site, stage 3: Secondary | ICD-10-CM | POA: Diagnosis not present

## 2018-08-12 DIAGNOSIS — I10 Essential (primary) hypertension: Secondary | ICD-10-CM | POA: Diagnosis not present

## 2018-08-12 DIAGNOSIS — L89152 Pressure ulcer of sacral region, stage 2: Secondary | ICD-10-CM | POA: Diagnosis not present

## 2018-08-13 ENCOUNTER — Inpatient Hospital Stay (HOSPITAL_COMMUNITY)
Admission: EM | Admit: 2018-08-13 | Discharge: 2018-08-16 | DRG: 300 | Disposition: A | Discharge status: Home / Self Care | Payer: Medicare Other | Attending: Internal Medicine | Admitting: Internal Medicine

## 2018-08-13 ENCOUNTER — Other Ambulatory Visit: Payer: Self-pay

## 2018-08-13 ENCOUNTER — Emergency Department (HOSPITAL_COMMUNITY): Payer: Medicare Other

## 2018-08-13 ENCOUNTER — Encounter (HOSPITAL_COMMUNITY): Payer: Self-pay

## 2018-08-13 DIAGNOSIS — Z79890 Hormone replacement therapy (postmenopausal): Secondary | ICD-10-CM

## 2018-08-13 DIAGNOSIS — Z79899 Other long term (current) drug therapy: Secondary | ICD-10-CM

## 2018-08-13 DIAGNOSIS — Z880 Allergy status to penicillin: Secondary | ICD-10-CM

## 2018-08-13 DIAGNOSIS — Z89611 Acquired absence of right leg above knee: Secondary | ICD-10-CM | POA: Diagnosis not present

## 2018-08-13 DIAGNOSIS — I70262 Atherosclerosis of native arteries of extremities with gangrene, left leg: Secondary | ICD-10-CM | POA: Diagnosis not present

## 2018-08-13 DIAGNOSIS — J302 Other seasonal allergic rhinitis: Secondary | ICD-10-CM | POA: Diagnosis present

## 2018-08-13 DIAGNOSIS — K219 Gastro-esophageal reflux disease without esophagitis: Secondary | ICD-10-CM | POA: Diagnosis present

## 2018-08-13 DIAGNOSIS — L97522 Non-pressure chronic ulcer of other part of left foot with fat layer exposed: Secondary | ICD-10-CM | POA: Diagnosis not present

## 2018-08-13 DIAGNOSIS — L03116 Cellulitis of left lower limb: Secondary | ICD-10-CM | POA: Diagnosis present

## 2018-08-13 DIAGNOSIS — Z515 Encounter for palliative care: Secondary | ICD-10-CM | POA: Diagnosis not present

## 2018-08-13 DIAGNOSIS — I10 Essential (primary) hypertension: Secondary | ICD-10-CM | POA: Diagnosis not present

## 2018-08-13 DIAGNOSIS — Z87891 Personal history of nicotine dependence: Secondary | ICD-10-CM

## 2018-08-13 DIAGNOSIS — Z66 Do not resuscitate: Secondary | ICD-10-CM | POA: Diagnosis present

## 2018-08-13 DIAGNOSIS — L97525 Non-pressure chronic ulcer of other part of left foot with muscle involvement without evidence of necrosis: Secondary | ICD-10-CM | POA: Diagnosis not present

## 2018-08-13 DIAGNOSIS — I739 Peripheral vascular disease, unspecified: Secondary | ICD-10-CM | POA: Diagnosis present

## 2018-08-13 DIAGNOSIS — I96 Gangrene, not elsewhere classified: Secondary | ICD-10-CM | POA: Diagnosis present

## 2018-08-13 DIAGNOSIS — I890 Lymphedema, not elsewhere classified: Secondary | ICD-10-CM | POA: Diagnosis present

## 2018-08-13 DIAGNOSIS — N179 Acute kidney failure, unspecified: Secondary | ICD-10-CM | POA: Diagnosis present

## 2018-08-13 DIAGNOSIS — Z888 Allergy status to other drugs, medicaments and biological substances status: Secondary | ICD-10-CM

## 2018-08-13 DIAGNOSIS — Z8249 Family history of ischemic heart disease and other diseases of the circulatory system: Secondary | ICD-10-CM | POA: Diagnosis not present

## 2018-08-13 DIAGNOSIS — Z9842 Cataract extraction status, left eye: Secondary | ICD-10-CM | POA: Diagnosis not present

## 2018-08-13 DIAGNOSIS — Z9841 Cataract extraction status, right eye: Secondary | ICD-10-CM | POA: Diagnosis not present

## 2018-08-13 DIAGNOSIS — Z7982 Long term (current) use of aspirin: Secondary | ICD-10-CM

## 2018-08-13 DIAGNOSIS — T148XXA Other injury of unspecified body region, initial encounter: Secondary | ICD-10-CM | POA: Diagnosis not present

## 2018-08-13 DIAGNOSIS — S81802A Unspecified open wound, left lower leg, initial encounter: Secondary | ICD-10-CM | POA: Diagnosis not present

## 2018-08-13 DIAGNOSIS — I70202 Unspecified atherosclerosis of native arteries of extremities, left leg: Secondary | ICD-10-CM | POA: Diagnosis present

## 2018-08-13 DIAGNOSIS — I129 Hypertensive chronic kidney disease with stage 1 through stage 4 chronic kidney disease, or unspecified chronic kidney disease: Secondary | ICD-10-CM | POA: Diagnosis present

## 2018-08-13 DIAGNOSIS — L97529 Non-pressure chronic ulcer of other part of left foot with unspecified severity: Secondary | ICD-10-CM | POA: Diagnosis present

## 2018-08-13 DIAGNOSIS — N183 Chronic kidney disease, stage 3 (moderate): Secondary | ICD-10-CM | POA: Diagnosis present

## 2018-08-13 DIAGNOSIS — R52 Pain, unspecified: Secondary | ICD-10-CM | POA: Diagnosis not present

## 2018-08-13 DIAGNOSIS — L89899 Pressure ulcer of other site, unspecified stage: Secondary | ICD-10-CM | POA: Diagnosis not present

## 2018-08-13 DIAGNOSIS — Z91040 Latex allergy status: Secondary | ICD-10-CM

## 2018-08-13 DIAGNOSIS — E039 Hypothyroidism, unspecified: Secondary | ICD-10-CM | POA: Diagnosis not present

## 2018-08-13 DIAGNOSIS — D631 Anemia in chronic kidney disease: Secondary | ICD-10-CM | POA: Diagnosis present

## 2018-08-13 DIAGNOSIS — Z7189 Other specified counseling: Secondary | ICD-10-CM | POA: Diagnosis not present

## 2018-08-13 DIAGNOSIS — Z961 Presence of intraocular lens: Secondary | ICD-10-CM | POA: Diagnosis present

## 2018-08-13 DIAGNOSIS — Z6829 Body mass index (BMI) 29.0-29.9, adult: Secondary | ICD-10-CM

## 2018-08-13 DIAGNOSIS — Z882 Allergy status to sulfonamides status: Secondary | ICD-10-CM

## 2018-08-13 DIAGNOSIS — S91302A Unspecified open wound, left foot, initial encounter: Secondary | ICD-10-CM | POA: Diagnosis not present

## 2018-08-13 DIAGNOSIS — L97822 Non-pressure chronic ulcer of other part of left lower leg with fat layer exposed: Secondary | ICD-10-CM | POA: Diagnosis not present

## 2018-08-13 DIAGNOSIS — D649 Anemia, unspecified: Secondary | ICD-10-CM | POA: Diagnosis not present

## 2018-08-13 DIAGNOSIS — Z91018 Allergy to other foods: Secondary | ICD-10-CM

## 2018-08-13 LAB — CBC WITH DIFFERENTIAL/PLATELET
Basophils Absolute: 0 K/uL (ref 0.0–0.1)
Basophils Relative: 0 %
Eosinophils Absolute: 0.7 K/uL (ref 0.0–0.7)
Eosinophils Relative: 6 %
HCT: 28.2 % — ABNORMAL LOW (ref 36.0–46.0)
Hemoglobin: 8.8 g/dL — ABNORMAL LOW (ref 12.0–15.0)
Lymphocytes Relative: 11 %
Lymphs Abs: 1.3 K/uL (ref 0.7–4.0)
MCH: 26.9 pg (ref 26.0–34.0)
MCHC: 31.2 g/dL (ref 30.0–36.0)
MCV: 86.2 fL (ref 78.0–100.0)
Monocytes Absolute: 0.9 K/uL (ref 0.1–1.0)
Monocytes Relative: 8 %
Neutro Abs: 8.7 K/uL — ABNORMAL HIGH (ref 1.7–7.7)
Neutrophils Relative %: 75 %
Platelets: 421 K/uL — ABNORMAL HIGH (ref 150–400)
RBC: 3.27 MIL/uL — ABNORMAL LOW (ref 3.87–5.11)
RDW: 15.2 % (ref 11.5–15.5)
WBC: 11.6 K/uL — ABNORMAL HIGH (ref 4.0–10.5)

## 2018-08-13 LAB — BASIC METABOLIC PANEL WITH GFR
Anion gap: 10 (ref 5–15)
BUN: 33 mg/dL — ABNORMAL HIGH (ref 8–23)
CO2: 23 mmol/L (ref 22–32)
Calcium: 9.6 mg/dL (ref 8.9–10.3)
Chloride: 103 mmol/L (ref 98–111)
Creatinine, Ser: 1.2 mg/dL — ABNORMAL HIGH (ref 0.44–1.00)
GFR calc Af Amer: 48 mL/min — ABNORMAL LOW
GFR calc non Af Amer: 42 mL/min — ABNORMAL LOW
Glucose, Bld: 88 mg/dL (ref 70–99)
Potassium: 4.5 mmol/L (ref 3.5–5.1)
Sodium: 136 mmol/L (ref 135–145)

## 2018-08-13 LAB — I-STAT CG4 LACTIC ACID, ED: Lactic Acid, Venous: 0.68 mmol/L (ref 0.5–1.9)

## 2018-08-13 LAB — SEDIMENTATION RATE: Sed Rate: 83 mm/h — ABNORMAL HIGH (ref 0–22)

## 2018-08-13 LAB — C-REACTIVE PROTEIN: CRP: 13.3 mg/dL — ABNORMAL HIGH

## 2018-08-13 MED ORDER — IBUPROFEN 400 MG PO TABS
400.0000 mg | ORAL_TABLET | Freq: Every day | ORAL | Status: DC
  Administered 2018-08-14: 400 mg via ORAL
  Filled 2018-08-13: qty 1

## 2018-08-13 MED ORDER — ONDANSETRON HCL 4 MG/2ML IJ SOLN: 4.0000 mg | Freq: Four times a day (QID) | INTRAMUSCULAR | Status: DC | PRN

## 2018-08-13 MED ORDER — LORATADINE 10 MG PO TABS
10.0000 mg | ORAL_TABLET | Freq: Every day | ORAL | Status: DC
  Administered 2018-08-14 – 2018-08-16 (×3): 10 mg via ORAL
  Filled 2018-08-13 (×3): qty 1

## 2018-08-13 MED ORDER — LEVOTHYROXINE SODIUM 125 MCG PO TABS
175.0000 ug | ORAL_TABLET | Freq: Every day | ORAL | Status: DC
  Administered 2018-08-14 – 2018-08-16 (×3): 175 ug via ORAL
  Filled 2018-08-13 (×3): qty 1

## 2018-08-13 MED ORDER — MAGNESIUM OXIDE 400 (241.3 MG) MG PO TABS
200.0000 mg | ORAL_TABLET | Freq: Every evening | ORAL | Status: DC
  Administered 2018-08-14 – 2018-08-16 (×3): 200 mg via ORAL
  Filled 2018-08-13 (×3): qty 1

## 2018-08-13 MED ORDER — ADULT MULTIVITAMIN W/MINERALS CH
1.0000 | ORAL_TABLET | Freq: Every evening | ORAL | Status: DC
  Administered 2018-08-14 – 2018-08-16 (×3): 1 via ORAL
  Filled 2018-08-13 (×3): qty 1

## 2018-08-13 MED ORDER — HEPARIN SODIUM (PORCINE) 5000 UNIT/ML IJ SOLN
5000.0000 [IU] | Freq: Three times a day (TID) | INTRAMUSCULAR | Status: DC
  Administered 2018-08-13 – 2018-08-16 (×9): 5000 [IU] via SUBCUTANEOUS
  Filled 2018-08-13 (×9): qty 1

## 2018-08-13 MED ORDER — ASPIRIN EC 81 MG PO TBEC
81.0000 mg | DELAYED_RELEASE_TABLET | Freq: Every evening | ORAL | Status: DC
  Administered 2018-08-15 – 2018-08-16 (×2): 81 mg via ORAL
  Filled 2018-08-13 (×3): qty 1

## 2018-08-13 MED ORDER — ADULT MULTIVITAMIN W/MINERALS CH
1.0000 | ORAL_TABLET | Freq: Every evening | ORAL | Status: DC
  Filled 2018-08-13: qty 1

## 2018-08-13 MED ORDER — CEFEPIME HCL 2 G IJ SOLR
2.0000 g | Freq: Once | INTRAMUSCULAR | Status: AC
  Administered 2018-08-13: 2 g via INTRAVENOUS
  Filled 2018-08-13: qty 2

## 2018-08-13 MED ORDER — SODIUM CHLORIDE 0.9 % IV SOLN: 2.0000 g | Freq: Once | INTRAVENOUS | Status: DC

## 2018-08-13 MED ORDER — MORPHINE SULFATE (PF) 2 MG/ML IV SOLN
2.0000 mg | Freq: Once | INTRAVENOUS | Status: AC
  Administered 2018-08-13: 2 mg via INTRAVENOUS
  Filled 2018-08-13: qty 1

## 2018-08-13 MED ORDER — SODIUM CHLORIDE 0.9 % IV SOLN
INTRAVENOUS | Status: DC
  Administered 2018-08-13: 22:00:00 via INTRAVENOUS
  Administered 2018-08-14: 125 mL/h via INTRAVENOUS
  Administered 2018-08-16: 04:00:00 via INTRAVENOUS

## 2018-08-13 MED ORDER — DIPHENHYDRAMINE HCL 25 MG PO CAPS
25.0000 mg | ORAL_CAPSULE | Freq: Three times a day (TID) | ORAL | Status: DC
  Administered 2018-08-13 – 2018-08-16 (×8): 25 mg via ORAL
  Filled 2018-08-13 (×8): qty 1

## 2018-08-13 MED ORDER — ONDANSETRON HCL 4 MG/2ML IJ SOLN
4.0000 mg | Freq: Once | INTRAMUSCULAR | Status: AC
  Administered 2018-08-13: 4 mg via INTRAVENOUS
  Filled 2018-08-13: qty 2

## 2018-08-13 MED ORDER — TRAMADOL HCL 50 MG PO TABS
50.0000 mg | ORAL_TABLET | Freq: Once | ORAL | Status: AC
  Administered 2018-08-13: 50 mg via ORAL
  Filled 2018-08-13: qty 1

## 2018-08-13 MED ORDER — DILTIAZEM HCL ER BEADS 120 MG PO CP24
120.0000 mg | ORAL_CAPSULE | Freq: Every day | ORAL | Status: DC
  Filled 2018-08-13: qty 1

## 2018-08-13 MED ORDER — MORPHINE SULFATE (PF) 4 MG/ML IV SOLN
4.0000 mg | Freq: Once | INTRAVENOUS | Status: AC
  Administered 2018-08-13: 4 mg via INTRAVENOUS
  Filled 2018-08-13: qty 1

## 2018-08-13 MED ORDER — SODIUM CHLORIDE 0.9 % IV SOLN
1.0000 g | Freq: Two times a day (BID) | INTRAVENOUS | Status: DC
  Administered 2018-08-14: 1 g via INTRAVENOUS
  Filled 2018-08-13 (×2): qty 1

## 2018-08-13 MED ORDER — MAGNESIUM 200 MG PO TABS
200.0000 mg | ORAL_TABLET | Freq: Every evening | ORAL | Status: DC
  Filled 2018-08-13: qty 1

## 2018-08-13 MED ORDER — FAMOTIDINE 20 MG PO TABS
20.0000 mg | ORAL_TABLET | Freq: Every day | ORAL | Status: DC
  Administered 2018-08-13 – 2018-08-16 (×4): 20 mg via ORAL
  Filled 2018-08-13 (×5): qty 1

## 2018-08-13 MED ORDER — VITAMIN C 500 MG PO TABS
500.0000 mg | ORAL_TABLET | Freq: Every evening | ORAL | Status: DC
  Administered 2018-08-14 – 2018-08-16 (×3): 500 mg via ORAL
  Filled 2018-08-13 (×3): qty 1

## 2018-08-13 MED ORDER — FERROUS SULFATE 325 (65 FE) MG PO TABS
650.0000 mg | ORAL_TABLET | Freq: Every evening | ORAL | Status: DC
  Administered 2018-08-15 – 2018-08-16 (×2): 650 mg via ORAL
  Filled 2018-08-13 (×2): qty 2

## 2018-08-13 MED ORDER — VANCOMYCIN HCL 10 G IV SOLR
2000.0000 mg | Freq: Once | INTRAVENOUS | Status: AC
  Administered 2018-08-13: 2000 mg via INTRAVENOUS
  Filled 2018-08-13: qty 2000

## 2018-08-13 MED ORDER — CALCIUM CARBONATE-VITAMIN D 500-200 MG-UNIT PO TABS
1.0000 | ORAL_TABLET | Freq: Every day | ORAL | Status: DC
  Administered 2018-08-14 – 2018-08-16 (×3): 1 via ORAL
  Filled 2018-08-13 (×3): qty 1

## 2018-08-13 MED ORDER — CALCIUM CARBONATE-VITAMIN D 600-400 MG-UNIT PO TABS: 1.0000 | ORAL_TABLET | Freq: Every evening | ORAL | Status: DC

## 2018-08-13 MED ORDER — ONDANSETRON HCL 4 MG PO TABS: 4.0000 mg | ORAL_TABLET | Freq: Four times a day (QID) | ORAL | Status: DC | PRN

## 2018-08-13 MED ORDER — GABAPENTIN 100 MG PO CAPS
200.0000 mg | ORAL_CAPSULE | Freq: Two times a day (BID) | ORAL | Status: DC
  Administered 2018-08-13 – 2018-08-16 (×6): 200 mg via ORAL
  Filled 2018-08-13 (×6): qty 2

## 2018-08-13 MED ORDER — HYDROMORPHONE HCL 1 MG/ML IJ SOLN: 0.5000 mg | Freq: Once | INTRAMUSCULAR | Status: DC

## 2018-08-13 NOTE — H&P (Addendum)
History and Physical   Shelby Reese IWL:[id number] DOB: 06/04/1935 DOA: 08/13/2018  Referring MD/NP/PA: Dr. Davonna Belling  PCP: [name], MD   Patient coming from: Home  Chief Complaint: Left leg pain  HPI: Shelby Reese is a 83 y.o. female with medical history significant of peripheral vascular disease status post right AKA, left lower extremity venous ulcer which is infected being followed at the wound clinic, hypertension, hypothyroidism, morbid obesity and chronic kidney disease stage III who was sent over from the wound care center due to worsening pain and infected left lower extremity venous ulcer.  Patient is complaining of 9 out of 10 pain.  She was seen by Dr. Quentin Cornwall in the wound care center.  She knows she may require below-knee amputation.  She is having significant fever in addition.  Also swollen leg with chronic lymphedema.  Patient's also appeared infected.  She is being admitted for treatment.  Patient has gangrenous area in the anterior ankle wound.  ED Course: Temperature is 99.8, blood pressure 154/54, pulse 95, respiratory of 19 and oxygen sat 98% on room air.  White count is 11.6, hemoglobin 8.8 and platelets 421.  Sodium is 136 potassium 4.5.  BUN 33 and creatinine 1.20 glucose 88.  Review of Systems: As per HPI otherwise 10 point review of systems negative.    Past Medical History:  Diagnosis Date  . Allergy   . Anemia   . CKD (chronic kidney disease), stage II   . Fibromyalgia   . GERD (gastroesophageal reflux disease)   . Hiatal hernia   . Hypertension   . Hypothyroid   . Lymphedema    venous insufficency  . Osteoarthritis   . Peripheral vascular disease ([place])   . Ulcer of knee ([place])    right  . Urinary incontinence   . Varicose veins   . Venous insufficiency     Past Surgical History:  Procedure Laterality Date  . AMPUTATION Right 12/15/2017   Procedure: AMPUTATION ABOVE KNEE, RIGHT;  Surgeon: Elam Dutch, MD;  Location: [place];   Service: Vascular;  Laterality: Right;  . APPLICATION OF WOUND VAC Right 07/18/2017   Procedure: APPLICATION OF WOUND VAC;  Surgeon: [name], MD;  Location: [place];  Service: Orthopedics;  Laterality: Right;  . CATARACT EXTRACTION W/ INTRAOCULAR LENS  IMPLANT, BILATERAL    . COLONOSCOPY    . COLONOSCOPY WITH PROPOFOL N/A 03/12/2018   Procedure: COLONOSCOPY WITH PROPOFOL;  Surgeon: Clarene Essex, MD;  Location: [place];  Service: Endoscopy;  Laterality: N/A;  . ESOPHAGOGASTRODUODENOSCOPY Left 03/09/2018   Procedure: ESOPHAGOGASTRODUODENOSCOPY (EGD);  Surgeon: Ronnette Juniper, MD;  Location: [place];  Service: Gastroenterology;  Laterality: Left;  . I&D EXTREMITY Right 07/18/2017   Procedure: IRRIGATION AND DEBRIDEMENT RIGHT KNEE;  Surgeon: [name], MD;  Location: [place];  Service: Orthopedics;  Laterality: Right;  . I&D EXTREMITY Right 07/23/2017   Procedure: REPEAT IRRIGATION AND DEBRIDEMENT RIGHT KNEE;  Surgeon: [name], MD;  Location: [place];  Service: Orthopedics;  Laterality: Right;  . I&D EXTREMITY Right 11/28/2017   Procedure: IRRIGATION AND DEBRIDEMENT RIGHT KNEE , APPLY INSTILLATION VAC;  Surgeon: [name], MD;  Location: [place];  Service: Orthopedics;  Laterality: Right;  . I&D EXTREMITY Right 12/03/2017   Procedure: REPEAT IRRIGATION AND DEBRIDEMENT RIGHT KNEE AND APPLICATION OF A WOUND VAC.;  Surgeon: [name], MD;  Location: [place];  Service: Orthopedics;  Laterality: Right;  . KNEE ARTHROSCOPY Left  menisectomy  . LOWER EXTREMITY ANGIOGRAPHY N/A 03/03/2018   Procedure: LOWER EXTREMITY ANGIOGRAPHY;  Surgeon: Serafina Mitchell, MD;  Location: [place] CV [facility];  Service: Cardiovascular;  Laterality: N/A;  . MULTIPLE TOOTH EXTRACTIONS    . PERIPHERAL VASCULAR ATHERECTOMY Left 03/03/2018   Procedure: PERIPHERAL VASCULAR ATHERECTOMY;  Surgeon: Serafina Mitchell, MD;  Location: [place] [facility];  Service: Cardiovascular;  Laterality: Left;  common femoral and  left SFA  . PERIPHERAL VASCULAR INTERVENTION  03/03/2018   Procedure: PERIPHERAL VASCULAR INTERVENTION;  Surgeon: Serafina Mitchell, MD;  Location: [place] [facility];  Service: Cardiovascular;;  left SFA  . SKIN SPLIT GRAFT Right 07/25/2017   Procedure: Repeat Irrigation and Debridement Right Knee, Split Thickness Skin Graft;  Surgeon: [name], MD;  Location: [place];  Service: Orthopedics;  Laterality: Right;  . SKIN SPLIT GRAFT Right 12/05/2017   Procedure: SKIN GRAFT SPLIT THICKNESS WOUND KNEE, APPLY VAC;  Surgeon: [name], MD;  Location: [place];  Service: Orthopedics;  Laterality: Right;  . TONSILLECTOMY       reports that she quit smoking about 38 years ago. She has a 5.00 pack-year smoking history. She has never used smokeless tobacco. She reports that she drinks about 14.0 standard drinks of alcohol per week. She reports that she does not use drugs.  Allergies  Allergen Reactions  . Cinnamon Hives  . Ciprofloxacin Other (See Comments)    TREMORS  . Diovan [Valsartan] Other (See Comments)    Extreme vertigo  . Food Diarrhea and Other (See Comments)    ORANGE JUICE   UPSET STOMACH  . Latex Rash and Other (See Comments)    Rash/inflammation due to exposure  . Nitrofuran Derivatives Hives and Rash    "Full body rash"  . Penicillins Hives and Swelling    *tolerated Ceftriaxone September 2018 Has patient had a PCN reaction causing immediate rash, facial/tongue/throat swelling, SOB or lightheadedness with hypotension:No--severe irritation at the injection site Has patient had a PCN reaction causing severe rash involving mucus membranes or skin necrosis:Unknown Has patient had a PCN reaction that required hospitalization:No Has patient had a PCN reaction occurring within the last 10 years:Yes If all of the above answers are "NO", then may proceed with  . Bactrim [Sulfamethoxazole-Trimethoprim] Diarrhea and Nausea Only  . Other Rash    Mycins, Strawberry, Oranges  . Sulfa  Antibiotics Diarrhea and Nausea Only    Family History  Problem Relation Age of Onset  . Stroke Mother   . Varicose Veins Mother   . Cancer Father        prostate  . Stroke Sister   . Heart disease Sister   . Varicose Veins Sister   . Stroke Maternal Grandmother   . Varicose Veins Sister      Prior to Admission medications   Medication Sig Start Date End Date Taking? Authorizing Provider  aspirin EC 81 MG tablet Take 81 mg by mouth every evening.    Yes [provider]  Calcium Carbonate-Vitamin D (CALCIUM 600+D) 600-400 MG-UNIT per tablet Take 1 tablet by mouth every evening.    Yes [provider]  cetirizine (ZYRTEC) 10 MG tablet Take 10 mg by mouth at bedtime.    Yes [provider]  diltiazem (TIAZAC) 120 MG 24 hr capsule HOLD until seen by your PCP Patient taking differently: Take 120 mg by mouth daily.  03/18/18  Yes Oretha Milch D, MD  diphenhydrAMINE (BENADRYL) 25 mg capsule Take 25  mg by mouth 3 (three) times daily.    Yes [provider]  famotidine (PEPCID) 20 MG tablet Take 2 tablets (40 mg total) by mouth 2 (two) times daily. Patient taking differently: Take 20 mg by mouth daily.  07/04/18  Yes Vasireddy, Grier Mitts, MD  ferrous sulfate 325 (65 FE) MG tablet Take 487.5 mg by mouth every evening.    Yes [provider]  FIBER PO Take 1 capsule by mouth 2 (two) times daily.   Yes [provider]  gabapentin (NEURONTIN) 100 MG capsule Take 200 mg by mouth 2 (two) times daily.    Yes [provider]  Glucosamine HCl (GLUCOSAMINE PO) Take 1 tablet by mouth daily.   Yes [provider]  ibuprofen (ADVIL,MOTRIN) 200 MG tablet Take 400 mg by mouth daily.   Yes [provider]  levothyroxine (SYNTHROID, LEVOTHROID) 175 MCG tablet Take 175 mcg by mouth daily before breakfast.   Yes [provider]  Magnesium 125 MG CAPS Take 125 mg by mouth every evening.    Yes [provider]    Multiple Vitamins-Minerals (MULTIVITAMIN WITH MINERALS) tablet Take 1 tablet by mouth every evening.    Yes [provider]  vitamin C (ASCORBIC ACID) 500 MG tablet Take 500 mg by mouth every evening.    Yes [provider]    Physical Exam: Vitals:   08/13/18 1540 08/13/18 1541 08/13/18 1745 08/13/18 1812  BP: (!) 154/54  (!) 147/56 (!) 147/56  Pulse: 81  79 80  Resp: 14   18  Temp: 98.3 F (36.8 C)     TempSrc: Oral     SpO2: 98%  96% 93%  Weight:  81.7 kg    Height:  5\' 5"  (1.651 m)        Constitutional: NAD, calm, comfortable Vitals:   08/13/18 1540 08/13/18 1541 08/13/18 1745 08/13/18 1812  BP: (!) 154/54  (!) 147/56 (!) 147/56  Pulse: 81  79 80  Resp: 14   18  Temp: 98.3 F (36.8 C)     TempSrc: Oral     SpO2: 98%  96% 93%  Weight:  81.7 kg    Height:  5\' 5"  (1.651 m)     Morbidly obese woman in obvious distress due to pain. Eyes: PERRL, lids and conjunctivae normal ENMT: Mucous membranes are moist. Posterior pharynx clear of any exudate or lesions.Normal dentition.  Neck: normal, supple, no masses, no thyromegaly Respiratory: clear to auscultation bilaterally, no wheezing, no crackles. Normal respiratory effort. No accessory muscle use.  Cardiovascular: Regular rate and rhythm, no murmurs / rubs / gallops. No extremity edema. 2+ pedal pulses. No carotid bruits.  Abdomen: no tenderness, no masses palpated. No hepatosplenomegaly. Bowel sounds positive.  Musculoskeletal: Status post right BKA no clubbing / cyanosis. No joint deformity upper and lower extremities. Good ROM, no contractures. Normal muscle tone.  Skin: Large left lower extremity ulcer infected and gangrenous around the ankle Neurologic: CN 2-12 grossly intact. Sensation intact, DTR normal. Strength 5/5 in all 4.  Psychiatric: Normal judgment and insight. Alert and oriented x 3. Normal mood.     Labs on Admission: I have personally reviewed following labs and imaging  studies  CBC: Recent Labs  Lab 08/13/18 1725  WBC 11.6*  NEUTROABS 8.7*  HGB 8.8*  HCT 28.2*  MCV 86.2  PLT 161*   Basic Metabolic Panel: Recent Labs  Lab 08/13/18 1725  NA 136  K 4.5  CL 103  CO2 23  GLUCOSE 88  BUN 33*  CREATININE 1.20*  CALCIUM 9.6   GFR: Estimated Creatinine Clearance: 39.5 mL/min (A) (by C-G formula based on SCr of 1.2 mg/dL (H)). Liver Function Tests: No results for input(s): AST, ALT, ALKPHOS, BILITOT, PROT, ALBUMIN in the last 168 hours. No results for input(s): LIPASE, AMYLASE in the last 168 hours. No results for input(s): AMMONIA in the last 168 hours. Coagulation Profile: No results for input(s): INR, PROTIME in the last 168 hours. Cardiac Enzymes: No results for input(s): CKTOTAL, CKMB, CKMBINDEX, TROPONINI in the last 168 hours. BNP (last 3 results) No results for input(s): PROBNP in the last 8760 hours. HbA1C: No results for input(s): HGBA1C in the last 72 hours. CBG: No results for input(s): GLUCAP in the last 168 hours. Lipid Profile: No results for input(s): CHOL, HDL, LDLCALC, TRIG, CHOLHDL, LDLDIRECT in the last 72 hours. Thyroid Function Tests: No results for input(s): TSH, T4TOTAL, FREET4, T3FREE, THYROIDAB in the last 72 hours. Anemia Panel: No results for input(s): VITAMINB12, FOLATE, FERRITIN, TIBC, IRON, RETICCTPCT in the last 72 hours. Urine analysis:    Component Value Date/Time   COLORURINE YELLOW 04/28/2018 2105   APPEARANCEUR CLEAR 04/28/2018 2105   LABSPEC 1.014 04/28/2018 2105   PHURINE 5.0 04/28/2018 2105   GLUCOSEU NEGATIVE 04/28/2018 2105   HGBUR NEGATIVE 04/28/2018 2105   BILIRUBINUR NEGATIVE 04/28/2018 2105   [place] NEGATIVE 04/28/2018 2105   PROTEINUR NEGATIVE 04/28/2018 2105   NITRITE NEGATIVE 04/28/2018 2105   LEUKOCYTESUR NEGATIVE 04/28/2018 2105   Sepsis Labs: @LABRCNTIP (procalcitonin:4,lacticidven:4) ) Recent Results (from the past 240 hour(s))  Aerobic Culture (superficial specimen)      Status: None   Collection Time: 08/05/18  1:50 PM  Result Value Ref Range Status   Specimen Description   Final    WOUND RIGHT LEG Performed at [facility] [address], [place], [place] [address]    Special Requests   Final    VENOUS STASIS ULCER Performed at [name], [place] [address], [place], [place] [address]    Gram Stain   Final    NO WBC SEEN NO ORGANISMS SEEN Performed at [facility], [place] [address], [place], [place] [address]    Culture   Final    FEW METHICILLIN RESISTANT STAPHYLOCOCCUS AUREUS RARE PSEUDOMONAS AERUGINOSA    Report Status 08/08/2018 FINAL  Final   Organism ID, Bacteria METHICILLIN RESISTANT STAPHYLOCOCCUS AUREUS  Final   Organism ID, Bacteria PSEUDOMONAS AERUGINOSA  Final      Susceptibility   Methicillin resistant staphylococcus aureus - MIC*    CIPROFLOXACIN >=8 RESISTANT Resistant     ERYTHROMYCIN >=8 RESISTANT Resistant     GENTAMICIN <=0.5 SENSITIVE Sensitive     OXACILLIN >=4 RESISTANT Resistant     TETRACYCLINE <=1 SENSITIVE Sensitive     VANCOMYCIN <=0.5 SENSITIVE Sensitive     TRIMETH/SULFA >=320 RESISTANT Resistant     CLINDAMYCIN <=0.25 SENSITIVE Sensitive     RIFAMPIN <=0.5 SENSITIVE Sensitive     Inducible Clindamycin NEGATIVE Sensitive     * FEW METHICILLIN RESISTANT STAPHYLOCOCCUS AUREUS   Pseudomonas aeruginosa - MIC*    CEFTAZIDIME 4 SENSITIVE Sensitive     CIPROFLOXACIN 0.5 SENSITIVE Sensitive     GENTAMICIN <=1 SENSITIVE Sensitive     IMIPENEM 1 SENSITIVE Sensitive     PIP/TAZO 32 SENSITIVE Sensitive     CEFEPIME 8 SENSITIVE Sensitive     * RARE PSEUDOMONAS AERUGINOSA     Radiological Exams on Admission: Dg  Tibia/fibula Left  Result Date: 08/13/2018 CLINICAL DATA:  Left leg wound EXAM: LEFT TIBIA AND FIBULA - 2 VIEW COMPARISON:  None. FINDINGS: No acute bony abnormality. Specifically, no fracture, subluxation, or dislocation. No radiographic changes of osteomyelitis.  Degenerative changes in the left knee and ankle. IMPRESSION: No acute bony abnormality. Electronically Signed   By: Rolm Baptise M.D.   On: 08/13/2018 18:16   Dg Foot Complete Left  Result Date: 08/13/2018 CLINICAL DATA:  Left foot and leg wound. EXAM: LEFT FOOT - COMPLETE 3+ VIEW COMPARISON:  06/29/2018 FINDINGS: Previously seen bone irregularity and lucency at the base of the left 5th metatarsal appears improved. Diffuse osteopenia. Decreasing soft tissue swelling within the foot. No visible radiographic changes of osteomyelitis currently. IMPRESSION: Improved appearance at the base of the left 5th metatarsal with decreasing lucency/irregularity. Osteopenia. Electronically Signed   By: Rolm Baptise M.D.   On: 08/13/2018 18:15      Assessment/Plan Principal Problem:   Gangrene of left lower extremity due to atherosclerosis [facility]) Active Problems:   Hypothyroidism, acquired   Essential hypertension   Peripheral vascular disease (HCC)   GERD (gastroesophageal reflux disease)   Cellulitis of left lower extremity   Gangrene of right foot ([place])     #1 gangrene of the left lower extremity: Secondary to vascular ulcer.  Infected,.  Will admit patient and start on IV antibiotics.  Dr. Quentin Cornwall aware and will follow patient in the hospital.  Most likely she might require amputation.  #2 severe peripheral vascular disease: Advanced and well-known.  Continue per surgery.  Vascular studies have been done in the past.  We may repeat them if warranted.  #3 cellulitis of the left lower activity: Continue IV antibiotics as indicated.  #4 GERD: Continue PPIs.  #5 hypothyroidism: Continue with levothyroxine.  #6 essential hypertension: Blood pressure elevated.  Resume home medications and titrate as needed.  #7 acute kidney injury: Continue hydration and follow closely.   DVT prophylaxis: Heparin Code Status: Full code Family Communication: No family available. Disposition Plan: Home Consults  called: Orthopedic surgery and Dr. Quentin Cornwall she will be called in the morning Admission status: Inpatient  Severity of Illness: The appropriate patient status for this patient is INPATIENT. Inpatient status is judged to be reasonable and necessary in order to provide the required intensity of service to ensure the patient's safety. The patient's presenting symptoms, physical exam findings, and initial radiographic and laboratory data in the context of their chronic comorbidities is felt to place them at high risk for further clinical deterioration. Furthermore, it is not anticipated that the patient will be medically stable for discharge from the hospital within 2 midnights of admission. The following factors support the patient status of inpatient.   " The patient's presenting symptoms include left foot ulcer involving the ankle with gangrene. " The worrisome physical exam findings include large gangrenous ulcer of the left foot. " The initial radiographic and laboratory data are worrisome because of leukocytosis. " The chronic co-morbidities include peripheral vascular disease with previous BKA on the right.   * I certify that at the point of admission it is my clinical judgment that the patient will require inpatient hospital care spanning beyond 2 midnights from the point of admission due to high intensity of service, high risk for further deterioration and high frequency of surveillance required.Barbette Merino MD [facility] Pager [phone]  If 7PM-7AM, please contact night-coverage [email or web address] Password [facility]  08/13/2018, 6:30 PM

## 2018-08-13 NOTE — Progress Notes (Signed)
A consult was received from an ED physician for Vancomycin & Cefepime per pharmacy dosing.  The patient's profile has been reviewed for ht/wt/allergies/indication/available labs.   A one time order has been placed for Vancomycin 2gm & Cefepime 2gm IV.  Further antibiotics/pharmacy consults should be ordered by admitting physician if indicated.                       Thank you, Biagio Borg 08/13/2018  4:47 PM

## 2018-08-13 NOTE — ED Notes (Signed)
Patient repositioned to left side.

## 2018-08-13 NOTE — ED Triage Notes (Signed)
Patient was sent from the wound care center. Patient states she was told her left leg wound is worse than on a previous visit.  Paper sent with the patient states, "likely left leg cellulitis with skin necrosis, severe pain and will likely need a BKA."

## 2018-08-13 NOTE — ED Notes (Signed)
Transport called to transport patient. 

## 2018-08-13 NOTE — Progress Notes (Signed)
Pharmacy Antibiotic Note  Shelby Reese is a 83 y.o. female admitted on 08/13/2018 from wound care clinica with worsening chronic left leg wounds. .  Concern for acute cellutlitis with new necrotic tissue on the anterior ankle wound.   Pharmacy has been consulted for cefepime dosing.  Plan: Cefepime 2gm IV x 1 in ED then 1gm q12h Follow renal function, cultures and clinical course  Height: 5\' 5"  (165.1 cm) Weight: 180 lb 1.9 oz (81.7 kg) IBW/kg (Calculated) : 57  Temp (24hrs), Avg:98.3 F (36.8 C), Min:98.3 F (36.8 C), Max:98.3 F (36.8 C)  Recent Labs  Lab 08/13/18 1725 08/13/18 1733  WBC 11.6*  --   CREATININE 1.20*  --   LATICACIDVEN  --  0.68    Estimated Creatinine Clearance: 39.5 mL/min (A) (by C-G formula based on SCr of 1.2 mg/dL (H)).    Allergies  Allergen Reactions  . Cinnamon Hives  . Ciprofloxacin Other (See Comments)    TREMORS  . Diovan [Valsartan] Other (See Comments)    Extreme vertigo  . Food Diarrhea and Other (See Comments)    ORANGE JUICE   UPSET STOMACH  . Latex Rash and Other (See Comments)    Rash/inflammation due to exposure  . Nitrofuran Derivatives Hives and Rash    "Full body rash"  . Penicillins Hives and Swelling    *tolerated Ceftriaxone September 2018 Has patient had a PCN reaction causing immediate rash, facial/tongue/throat swelling, SOB or lightheadedness with hypotension:No--severe irritation at the injection site Has patient had a PCN reaction causing severe rash involving mucus membranes or skin necrosis:Unknown Has patient had a PCN reaction that required hospitalization:No Has patient had a PCN reaction occurring within the last 10 years:Yes If all of the above answers are "NO", then may proceed with  . Bactrim [Sulfamethoxazole-Trimethoprim] Diarrhea and Nausea Only  . Other Rash    Mycins, Strawberry, Oranges  . Sulfa Antibiotics Diarrhea and Nausea Only    Antimicrobials this admission: 9/26 vanc >> 9/26 cefepime  >> Dose adjustments this admission:   Microbiology results: 9/26 BCx:   Thank you for allowing pharmacy to be a part of this patient's care.  Dolly Rias RPh 08/13/2018, 7:08 PM Pager [phone]

## 2018-08-13 NOTE — ED Provider Notes (Signed)
[facility] DEPT Provider Note   CSN: [id number] Arrival date & time: 08/13/18  1453     History   Chief Complaint Chief Complaint  Patient presents with  . sent from wound care center    HPI Shelby Reese is a 84 y.o. female.  HPI   83 yo F with h/o PVD, chronic wound, CKD, lymphedema here w/ worsening wound. Sent from [facility].  Per pt report, she has been seeing the wound care center for "a long time" for chronic left leg wounds.  Over the last week, her left leg wound has gotten increasingly and now severely painful.  She describes it as an aching, throbbing pain that is very sharp with any kind of exposure to the air.  She said associated subjective fevers and chills.  She has had generalized weakness and loss of appetite.  She went to the wound care center today and was sent here by Dr. Quentin Cornwall for admission and likely BKA.  Concern for acute cellulitis with new necrotic tissue on the anterior ankle wound.  Denies any alleviating factors.  Wound culture from 9/18 shows MRSA and Pseudomonas.  Past Medical History:  Diagnosis Date  . Allergy   . Anemia   . CKD (chronic kidney disease), stage II   . Fibromyalgia   . GERD (gastroesophageal reflux disease)   . Hiatal hernia   . Hypertension   . Hypothyroid   . Lymphedema    venous insufficency  . Osteoarthritis   . Peripheral vascular disease ([place])   . Ulcer of knee ([place])    right  . Urinary incontinence   . Varicose veins   . Venous insufficiency     Patient Active Problem List   Diagnosis Date Noted  . Goals of care, counseling/discussion   . Palliative care by specialist   . Chronic pain of left lower extremity   . Gangrene of left lower extremity due to atherosclerosis ([place])   . Moderate protein-calorie malnutrition ([place])   . Cellulitis of left lower extremity   . Symptomatic anemia 03/09/2018  . Fecal occult blood test positive   . Arterioloscleroses 03/02/2018  .  Anemia 02/23/2018  . Foot ulcer ([place]) 02/23/2018  . Depression due to physical illness 02/18/2018  . Chronic ulcer of right heel with necrosis of bone ([place]) 12/27/2017  . Peripheral vascular disease of lower extremity with ulceration ([place]) 12/27/2017  . Acute renal failure superimposed on stage 3 chronic kidney disease ([place]) 12/27/2017  . GERD (gastroesophageal reflux disease) 12/27/2017  . CKD (chronic kidney disease), stage III ([place]) 08/29/2017  . Hypertension 08/29/2017  . Peripheral vascular disease ([place]) 08/29/2017  . Skin ulcer of knee, right, with fat layer exposed ([place]) 08/29/2017  . Pressure injury of skin 07/25/2017  . Wound, open, knee, lower leg, or ankle with complication, right, initial encounter   . Idiopathic chronic venous hypertension of both lower extremities with ulcer and inflammation ([place]) 07/15/2017  . Skin ulcer of right knee with necrosis of muscle ([place]) 07/15/2017  . Atherosclerosis of artery of right lower extremity ([place]) 07/11/2017  . Essential hypertension 07/11/2017  . Osteoporosis 07/11/2017  . Cataracts, bilateral 07/11/2017  . Hypothyroidism, acquired 12/27/2014  . Iron deficiency anemia 12/27/2014  . Hiatal hernia 12/27/2014  . Varicose veins of lower extremities with other complications 89/38/1017    Past Surgical History:  Procedure Laterality Date  . AMPUTATION Right 12/15/2017   Procedure: AMPUTATION ABOVE KNEE, RIGHT;  Surgeon: Elam Dutch,  MD;  Location: [facility];  Service: Vascular;  Laterality: Right;  . APPLICATION OF WOUND VAC Right 07/18/2017   Procedure: APPLICATION OF WOUND VAC;  Surgeon: [name], MD;  Location: [facility];  Service: Orthopedics;  Laterality: Right;  . CATARACT EXTRACTION W/ INTRAOCULAR LENS  IMPLANT, BILATERAL    . COLONOSCOPY    . COLONOSCOPY WITH PROPOFOL N/A 03/12/2018   Procedure: COLONOSCOPY WITH PROPOFOL;  Surgeon: Clarene [place], MD;  Location: [facility];  Service: Endoscopy;  Laterality: N/A;  .  ESOPHAGOGASTRODUODENOSCOPY Left 03/09/2018   Procedure: ESOPHAGOGASTRODUODENOSCOPY (EGD);  Surgeon: Ronnette Juniper, MD;  Location: [facility];  Service: Gastroenterology;  Laterality: Left;  . I&D EXTREMITY Right 07/18/2017   Procedure: IRRIGATION AND DEBRIDEMENT RIGHT KNEE;  Surgeon: [name], MD;  Location: [facility];  Service: Orthopedics;  Laterality: Right;  . I&D EXTREMITY Right 07/23/2017   Procedure: REPEAT IRRIGATION AND DEBRIDEMENT RIGHT KNEE;  Surgeon: [name], MD;  Location: [facility];  Service: Orthopedics;  Laterality: Right;  . I&D EXTREMITY Right 11/28/2017   Procedure: IRRIGATION AND DEBRIDEMENT RIGHT KNEE , APPLY INSTILLATION VAC;  Surgeon: [name], MD;  Location: [facility];  Service: Orthopedics;  Laterality: Right;  . I&D EXTREMITY Right 12/03/2017   Procedure: REPEAT IRRIGATION AND DEBRIDEMENT RIGHT KNEE AND APPLICATION OF A WOUND VAC.;  Surgeon: [name], MD;  Location: [facility];  Service: Orthopedics;  Laterality: Right;  . KNEE ARTHROSCOPY Left    menisectomy  . LOWER EXTREMITY ANGIOGRAPHY N/A 03/03/2018   Procedure: LOWER EXTREMITY ANGIOGRAPHY;  Surgeon: Serafina Mitchell, MD;  Location: [facility];  Service: Cardiovascular;  Laterality: N/A;  . MULTIPLE TOOTH EXTRACTIONS    . PERIPHERAL VASCULAR ATHERECTOMY Left 03/03/2018   Procedure: PERIPHERAL VASCULAR ATHERECTOMY;  Surgeon: Serafina Mitchell, MD;  Location: [facility];  Service: Cardiovascular;  Laterality: Left;  common femoral and left SFA  . PERIPHERAL VASCULAR INTERVENTION  03/03/2018   Procedure: PERIPHERAL VASCULAR INTERVENTION;  Surgeon: Serafina Mitchell, MD;  Location: [facility];  Service: Cardiovascular;;  left SFA  . SKIN SPLIT GRAFT Right 07/25/2017   Procedure: Repeat Irrigation and Debridement Right Knee, Split Thickness Skin Graft;  Surgeon: [name], MD;  Location: [facility];  Service: Orthopedics;  Laterality: Right;  . SKIN SPLIT GRAFT Right 12/05/2017   Procedure: SKIN GRAFT  SPLIT THICKNESS WOUND KNEE, APPLY VAC;  Surgeon: [name], MD;  Location: [facility];  Service: Orthopedics;  Laterality: Right;  . TONSILLECTOMY       OB History   None      Home Medications    Prior to Admission medications   Medication Sig Start Date End Date Taking? Authorizing Provider  aspirin EC 81 MG tablet Take 81 mg by mouth every evening.    Yes [provider]  Calcium Carbonate-Vitamin D (CALCIUM 600+D) 600-400 MG-UNIT per tablet Take 1 tablet by mouth every evening.    Yes [provider]  cetirizine (ZYRTEC) 10 MG tablet Take 10 mg by mouth at bedtime.    Yes [provider]  diltiazem (TIAZAC) 120 MG 24 hr capsule HOLD until seen by your PCP Patient taking differently: Take 120 mg by mouth daily.  03/18/18  Yes Oretha Milch D, MD  diphenhydrAMINE (BENADRYL) 25 mg capsule Take 25 mg by mouth 3 (three) times daily.    Yes [provider]  famotidine (PEPCID) 20 MG tablet Take 2 tablets (40 mg total) by  mouth 2 (two) times daily. Patient taking differently: Take 20 mg by mouth daily.  07/04/18  Yes Vasireddy, Grier Mitts, MD  ferrous sulfate 325 (65 FE) MG tablet Take 487.5 mg by mouth every evening.    Yes [provider]  FIBER PO Take 1 capsule by mouth 2 (two) times daily.   Yes [provider]  gabapentin (NEURONTIN) 100 MG capsule Take 200 mg by mouth 2 (two) times daily.    Yes [provider]  Glucosamine HCl (GLUCOSAMINE PO) Take 1 tablet by mouth daily.   Yes [provider]  ibuprofen (ADVIL,MOTRIN) 200 MG tablet Take 400 mg by mouth daily.   Yes [provider]  levothyroxine (SYNTHROID, LEVOTHROID) 175 MCG tablet Take 175 mcg by mouth daily before breakfast.   Yes [provider]  Magnesium 125 MG CAPS Take 125 mg by mouth every evening.    Yes [provider]  Multiple Vitamins-Minerals (MULTIVITAMIN WITH MINERALS) tablet Take 1 tablet by mouth every evening.    Yes  [provider]  vitamin C (ASCORBIC ACID) 500 MG tablet Take 500 mg by mouth every evening.    Yes [provider]    Family History Family History  Problem Relation Age of Onset  . Stroke Mother   . Varicose Veins Mother   . Cancer Father        prostate  . Stroke Sister   . Heart disease Sister   . Varicose Veins Sister   . Stroke Maternal Grandmother   . Varicose Veins Sister     Social History Social History   Tobacco Use  . Smoking status: Former Smoker    Packs/day: 0.50    Years: 10.00    Pack years: 5.00    Last attempt to quit: 11/19/1979    Years since quitting: 38.7  . Smokeless tobacco: Never Used  Substance Use Topics  . Alcohol use: Yes    Alcohol/week: 14.0 standard drinks    Types: 14 Glasses of wine per week    Comment: 2 glasses of wine with dinner  . Drug use: No     Allergies   Cinnamon; Ciprofloxacin; Diovan [valsartan]; Food; Latex; Nitrofuran derivatives; Penicillins; Bactrim [sulfamethoxazole-trimethoprim]; Other; and Sulfa antibiotics   Review of Systems Review of Systems  Constitutional: Positive for chills, fatigue and fever.  HENT: Negative for congestion and rhinorrhea.   Eyes: Negative for visual disturbance.  Respiratory: Negative for cough, shortness of breath and wheezing.   Cardiovascular: Negative for chest pain and leg swelling.  Gastrointestinal: Positive for nausea. Negative for abdominal pain, diarrhea and vomiting.  Genitourinary: Negative for dysuria and flank pain.  Musculoskeletal: Negative for neck pain and neck stiffness.  Skin: Positive for rash and wound.  Allergic/Immunologic: Negative for immunocompromised state.  Neurological: Positive for weakness. Negative for syncope and headaches.  All other systems reviewed and are negative.    Physical Exam Updated Vital Signs BP (!) 147/56 (BP Location: Right Arm)   Pulse 80   Temp 98.3 F (36.8 C) (Oral)   Resp 18   Ht 5\' 5"  (1.651 m)   Wt  81.7 kg   SpO2 93%   BMI 29.97 kg/m   Physical Exam  Constitutional: She is oriented to person, place, and time. She appears well-developed and well-nourished. She appears distressed (Appears to be in pain).  HENT:  Head: Normocephalic and atraumatic.  Mouth/Throat: Oropharynx is clear and moist.  Eyes: Conjunctivae are normal.  Neck: Neck supple.  Cardiovascular: Normal  rate, regular rhythm and normal heart sounds. Exam reveals no friction rub.  No murmur heard. Pulmonary/Chest: Effort normal and breath sounds normal. No respiratory distress. She has no wheezes. She has no rales.  Abdominal: Soft. Bowel sounds are normal. She exhibits no distension. There is no tenderness. There is no rebound.  Neurological: She is alert and oriented to person, place, and time. She exhibits normal muscle tone.  Skin: Skin is warm. Capillary refill takes less than 2 seconds.  Psychiatric: She has a normal mood and affect.  Nursing note and vitals reviewed.   LOWER EXTREMITY EXAM: LEFT  INSPECTION & PALPATION: Ulcerated, foul-smelling wound to anterior ankle/proximal foot with necrotic tissue base. Superficial wound to anterior shin w/ fibrinous, purulent exudate throughout wound bed, though no overt necrosis.  SENSORY: sensation is intact to light touch in:  Superficial peroneal nerve distribution (over dorsum of foot)     Deep peroneal nerve distribution (over first dorsal web space) Sural nerve distribution (over lateral aspect 5th metatarsal) Saphenous nerve distribution (over medial instep)  MOTOR:  + Motor EHL (great toe dorsiflexion) + FHL (great toe plantar flexion)  + TA (ankle dorsiflexion)  + GSC (ankle plantar flexion)  VASCULAR: Faint, doppler-able PT     ED Treatments / Results  Labs (all labs ordered are listed, but only abnormal results are displayed) Labs Reviewed  CBC WITH DIFFERENTIAL/PLATELET - Abnormal; Notable for the following components:      Result Value     WBC 11.6 (*)    RBC 3.27 (*)    Hemoglobin 8.8 (*)    HCT 28.2 (*)    Platelets 421 (*)    Neutro Abs 8.7 (*)    All other components within normal limits  BASIC METABOLIC PANEL - Abnormal; Notable for the following components:   BUN 33 (*)    Creatinine, Ser 1.20 (*)    GFR calc non Af Amer 42 (*)    GFR calc Af Amer 48 (*)    All other components within normal limits  CULTURE, BLOOD (ROUTINE X 2)  CULTURE, BLOOD (ROUTINE X 2)  SEDIMENTATION RATE  C-REACTIVE PROTEIN  I-STAT CG4 LACTIC ACID, ED  I-STAT CG4 LACTIC ACID, ED    EKG None  Radiology Dg Tibia/fibula Left  Result Date: 08/13/2018 CLINICAL DATA:  Left leg wound EXAM: LEFT TIBIA AND FIBULA - 2 VIEW COMPARISON:  None. FINDINGS: No acute bony abnormality. Specifically, no fracture, subluxation, or dislocation. No radiographic changes of osteomyelitis. Degenerative changes in the left knee and ankle. IMPRESSION: No acute bony abnormality. Electronically Signed   By: Rolm Baptise M.D.   On: 08/13/2018 18:16   Dg Foot Complete Left  Result Date: 08/13/2018 CLINICAL DATA:  Left foot and leg wound. EXAM: LEFT FOOT - COMPLETE 3+ VIEW COMPARISON:  06/29/2018 FINDINGS: Previously seen bone irregularity and lucency at the base of the left 5th metatarsal appears improved. Diffuse osteopenia. Decreasing soft tissue swelling within the foot. No visible radiographic changes of osteomyelitis currently. IMPRESSION: Improved appearance at the base of the left 5th metatarsal with decreasing lucency/irregularity. Osteopenia. Electronically Signed   By: Rolm Baptise M.D.   On: 08/13/2018 18:15    Procedures Procedures (including critical care time)  Medications Ordered in ED Medications  vancomycin (VANCOCIN) 2,000 mg in sodium chloride 0.9 % 500 mL IVPB (has no administration in time range)  ceFEPIme (MAXIPIME) 2 g in sodium chloride 0.9 % 100 mL IVPB (2 g Intravenous New Bag/Given 08/13/18 1755)  morphine 4  MG/ML injection 4 mg (4 mg  Intravenous Given 08/13/18 1740)  ondansetron (ZOFRAN) injection 4 mg (4 mg Intravenous Given 08/13/18 1740)     Initial Impression / Assessment and Plan / ED Course  I have reviewed the triage vital signs and the nursing notes.  Pertinent labs & imaging results that were available during my care of the patient were reviewed by me and considered in my medical decision making (see chart for details).     83 yo F with PMHx as above here w/ open, draining, gangrenous wound to L foot. Sent by wound care MD for superimposed cellulitis. Pt does have mod leukocytosis here. No fever or signs of sepsis. Will start IV ABX for MRSA/PSA coverage based on 9/18 cultures (on paperwork w/ patient), admit. No signs of necrotizing infection. No apparent osteo on plain films.  Final Clinical Impressions(s) / ED Diagnoses   Final diagnoses:  Left leg cellulitis  Wound of left lower extremity, initial encounter    ED Discharge Orders    None       Duffy Bruce, MD 08/13/18 1821

## 2018-08-13 NOTE — ED Notes (Signed)
ED TO INPATIENT HANDOFF REPORT  Name/Age/Gender Shelby Reese 83 y.o. female  Code Status Code Status History    Date Active Date Inactive Code Status Order ID Comments User Context   06/29/2018 2333 07/04/2018 2104 DNR [id number]  Schorr, Rhetta Mura, NP Inpatient   06/29/2018 2144 06/29/2018 2333 Full Code [id number]  Vianne Bulls, MD ED   02/23/2018 2113 03/18/2018 1832 Full Code [id number]  Merton Border, MD Inpatient   12/12/2017 2301 12/23/2017 1926 Full Code [id number]  Desiree Hane, MD Inpatient   11/28/2017 1307 12/08/2017 1808 Full Code [id number]  [name], MD Inpatient   08/29/2017 1810 09/04/2017 1946 Full Code [id number]  Samella Parr, NP Inpatient   07/18/2017 0341 07/29/2017 2238 Full Code [id number]  Danford, Suann Larry, MD Inpatient    Questions for Most Recent Historical Code Status (Order [id number])    Question Answer Comment   In the event of cardiac or respiratory ARREST Do not call a "code blue"    In the event of cardiac or respiratory ARREST Do not perform Intubation, CPR, defibrillation or ACLS    In the event of cardiac or respiratory ARREST Use medication by any route, position, wound care, and other measures to relive pain and suffering. May use oxygen, suction and manual treatment of airway obstruction as needed for comfort.         Advance Directive Documentation     Most Recent Value  Type of Advance Directive  Healthcare Power of Attorney, Living will  Pre-existing out of facility DNR order (yellow form or pink MOST form)  -  "MOST" Form in Place?  -      Home/SNF/Other Home  Chief Complaint Sent over from wound center  Level of Care/Admitting Diagnosis ED Disposition    ED Disposition Condition [place]: [facility] [[id number]]  Level of Care: Med-Surg [16]  Diagnosis: Gangrene of right foot [facility] [[id number]]  Admitting Physician: Elwyn Reach [[id number]]  Attending Physician: Elwyn Reach [[id number]]  Estimated length of stay: past midnight tomorrow  Certification:: I certify this patient will need inpatient services for at least 2 midnights  PT Class (Do Not Modify): Inpatient [101]  PT Acc Code (Do Not Modify): Private [1]       Medical History Past Medical History:  Diagnosis Date  . Allergy   . Anemia   . CKD (chronic kidney disease), stage II   . Fibromyalgia   . GERD (gastroesophageal reflux disease)   . Hiatal hernia   . Hypertension   . Hypothyroid   . Lymphedema    venous insufficency  . Osteoarthritis   . Peripheral vascular disease ([place])   . Ulcer of knee ([place])    right  . Urinary incontinence   . Varicose veins   . Venous insufficiency     Allergies Allergies  Allergen Reactions  . Cinnamon Hives  . Ciprofloxacin Other (See Comments)    TREMORS  . Diovan [Valsartan] Other (See Comments)    Extreme vertigo  . Food Diarrhea and Other (See Comments)    ORANGE JUICE   UPSET STOMACH  . Latex Rash and Other (See Comments)    Rash/inflammation due to exposure  . Nitrofuran Derivatives Hives and Rash    "Full body rash"  . Penicillins Hives and Swelling    *tolerated Ceftriaxone September 2018 Has patient had a PCN reaction causing immediate rash, facial/tongue/throat swelling, SOB or lightheadedness with hypotension:No--severe irritation at the  injection site Has patient had a PCN reaction causing severe rash involving mucus membranes or skin necrosis:Unknown Has patient had a PCN reaction that required hospitalization:No Has patient had a PCN reaction occurring within the last 10 years:Yes If all of the above answers are "NO", then may proceed with  . Bactrim [Sulfamethoxazole-Trimethoprim] Diarrhea and Nausea Only  . Other Rash    Mycins, Strawberry, Oranges  . Sulfa Antibiotics Diarrhea and Nausea Only    IV Location/Drains/Wounds Patient Lines/Drains/Airways Status   Active Line/Drains/Airways    Name:   Placement date:   Placement time:   Site:    Days:   Peripheral IV 08/13/18 Left Wrist   08/13/18    1752    Wrist   less than 1   External Urinary Catheter   07/02/18    1713    -   42   Pressure Injury 02/23/18 Unstageable - Full thickness tissue loss in which the base of the ulcer is covered by slough (yellow, tan, gray, green or brown) and/or eschar (tan, brown or black) in the wound bed.   02/23/18    2200     171   Pressure Injury 06/29/18 Stage I -  Intact skin with non-blanchable redness of a localized area usually over a bony prominence.   06/29/18    2230     45   Wound / Incision (Open or Dehisced) 02/23/18 Non-pressure wound;Venous stasis ulcer Foot Other (Comment)   02/23/18    2200    Foot   171   Wound / Incision (Open or Dehisced) 02/23/18 Non-pressure wound Foot Other (Comment)   02/23/18    2200    Foot   171   Wound / Incision (Open or Dehisced) 03/11/18 Non-pressure wound Perineum Right 3cm in length   03/11/18    2100    Perineum   155   Wound / Incision (Open or Dehisced) 06/29/18 Non-pressure wound Foot Anterior;Left   06/29/18    2330    Foot   45   Wound / Incision (Open or Dehisced) 07/03/18 Other (Comment) Tibial Left;Posterior   07/03/18    2200    Tibial   41          Labs/Imaging Results for orders placed or performed during the hospital encounter of 08/13/18 (from the past 48 hour(s))  CBC with Differential     Status: Abnormal   Collection Time: 08/13/18  5:25 PM  Result Value Ref Range   WBC 11.6 (H) 4.0 - 10.5 K/uL   RBC 3.27 (L) 3.87 - 5.11 MIL/uL   Hemoglobin 8.8 (L) 12.0 - 15.0 g/dL   HCT 28.2 (L) 36.0 - 46.0 %   MCV 86.2 78.0 - 100.0 fL   MCH 26.9 26.0 - 34.0 pg   MCHC 31.2 30.0 - 36.0 g/dL   RDW 15.2 11.5 - 15.5 %   Platelets 421 (H) 150 - 400 K/uL   Neutrophils Relative % 75 %   Neutro Abs 8.7 (H) 1.7 - 7.7 K/uL   Lymphocytes Relative 11 %   Lymphs Abs 1.3 0.7 - 4.0 K/uL   Monocytes Relative 8 %   Monocytes Absolute 0.9 0.1 - 1.0 K/uL   Eosinophils Relative 6 %   Eosinophils Absolute  0.7 0.0 - 0.7 K/uL   Basophils Relative 0 %   Basophils Absolute 0.0 0.0 - 0.1 K/uL    Comment: Performed at [facility], [place] [address], [place], [place] [address]  Basic  metabolic panel     Status: Abnormal   Collection Time: 08/13/18  5:25 PM  Result Value Ref Range   Sodium 136 135 - 145 mmol/L   Potassium 4.5 3.5 - 5.1 mmol/L   Chloride 103 98 - 111 mmol/L   CO2 23 22 - 32 mmol/L   Glucose, Bld 88 70 - 99 mg/dL   BUN 33 (H) 8 - 23 mg/dL   Creatinine, Ser 1.20 (H) 0.44 - 1.00 mg/dL   Calcium 9.6 8.9 - 10.3 mg/dL   GFR calc non Af Amer 42 (L) >60 mL/min   GFR calc Af Amer 48 (L) >60 mL/min    Comment: (NOTE) The eGFR has been calculated using the CKD EPI equation. This calculation has not been validated in all clinical situations. eGFR's persistently <60 mL/min signify possible Chronic Kidney Disease.    Anion gap 10 5 - 15    Comment: Performed at [facility], [place] [address], [place], [place] [address]  Sedimentation rate     Status: Abnormal   Collection Time: 08/13/18  5:25 PM  Result Value Ref Range   Sed Rate 83 (H) 0 - 22 mm/hr    Comment: Performed at [facility], [place] [address], [place], [place] [address]  I-Stat CG4 Lactic Acid, ED     Status: None   Collection Time: 08/13/18  5:33 PM  Result Value Ref Range   Lactic Acid, Venous 0.68 0.5 - 1.9 mmol/L  C-reactive protein     Status: Abnormal   Collection Time: 08/13/18  5:41 PM  Result Value Ref Range   CRP 13.3 (H) <1.0 mg/dL    Comment: Performed at [facility], [place] [address], [place], [place] [address]   Dg Tibia/fibula Left  Result Date: 08/13/2018 CLINICAL DATA:  Left leg wound EXAM: LEFT TIBIA AND FIBULA - 2 VIEW COMPARISON:  None. FINDINGS: No acute bony abnormality. Specifically, no fracture, subluxation, or dislocation. No radiographic changes of osteomyelitis. Degenerative changes in the left knee and ankle. IMPRESSION: No  acute bony abnormality. Electronically Signed   By: Rolm Baptise M.D.   On: 08/13/2018 18:16   Dg Foot Complete Left  Result Date: 08/13/2018 CLINICAL DATA:  Left foot and leg wound. EXAM: LEFT FOOT - COMPLETE 3+ VIEW COMPARISON:  06/29/2018 FINDINGS: Previously seen bone irregularity and lucency at the base of the left 5th metatarsal appears improved. Diffuse osteopenia. Decreasing soft tissue swelling within the foot. No visible radiographic changes of osteomyelitis currently. IMPRESSION: Improved appearance at the base of the left 5th metatarsal with decreasing lucency/irregularity. Osteopenia. Electronically Signed   By: Rolm Baptise M.D.   On: 08/13/2018 18:15    Pending Labs Unresulted Labs (From admission, onward)    Start     Ordered   08/13/18 1638  Blood culture (routine x 2)  BLOOD CULTURE X 2,   STAT     08/13/18 1638   Signed and Held  CBC  (heparin)  Once,   R    Comments:  Baseline for heparin therapy IF NOT ALREADY DRAWN.  Notify MD if PLT < 100 K.    Signed and Held   Signed and Held  Creatinine, serum  (heparin)  Once,   R    Comments:  Baseline for heparin therapy IF NOT ALREADY DRAWN.    Signed and Held   Signed and Held  Comprehensive metabolic panel  Tomorrow morning,   R     Signed and Held  Signed and Held  CBC  Tomorrow morning,   R     Signed and Held          Vitals/Pain Today's Vitals   08/13/18 2002 08/13/18 2004 08/13/18 2041 08/13/18 2041  BP: (!) 130/53  (!) 124/49   Pulse: 95  87   Resp: 19  18   Temp:      TempSrc:      SpO2: 97%  95%   Weight:      Height:      PainSc:  3   7     Isolation Precautions No active isolations  Medications Medications  ceFEPIme (MAXIPIME) 1 g in sodium chloride 0.9 % 100 mL IVPB (has no administration in time range)  morphine 4 MG/ML injection 4 mg (4 mg Intravenous Given 08/13/18 1740)  ondansetron (ZOFRAN) injection 4 mg (4 mg Intravenous Given 08/13/18 1740)  vancomycin (VANCOCIN) 2,000 mg in sodium  chloride 0.9 % 500 mL IVPB (2,000 mg Intravenous New Bag/Given 08/13/18 1830)  ceFEPIme (MAXIPIME) 2 g in sodium chloride 0.9 % 100 mL IVPB (0 g Intravenous Stopped 08/13/18 1827)  morphine 2 MG/ML injection 2 mg (2 mg Intravenous Given 08/13/18 2041)    Mobility non-ambulatory

## 2018-08-13 NOTE — ED Notes (Signed)
Bed: WA02 Expected date:  Expected time:  Means of arrival:  Comments: EMS-fall 

## 2018-08-13 NOTE — ED Notes (Signed)
Hospitalist at bedside 

## 2018-08-14 ENCOUNTER — Encounter (HOSPITAL_COMMUNITY): Payer: Medicare Other

## 2018-08-14 ENCOUNTER — Inpatient Hospital Stay (HOSPITAL_COMMUNITY): Payer: Medicare Other

## 2018-08-14 DIAGNOSIS — E039 Hypothyroidism, unspecified: Secondary | ICD-10-CM

## 2018-08-14 DIAGNOSIS — I70202 Unspecified atherosclerosis of native arteries of extremities, left leg: Secondary | ICD-10-CM

## 2018-08-14 DIAGNOSIS — Z7189 Other specified counseling: Secondary | ICD-10-CM

## 2018-08-14 DIAGNOSIS — K219 Gastro-esophageal reflux disease without esophagitis: Secondary | ICD-10-CM

## 2018-08-14 DIAGNOSIS — Z515 Encounter for palliative care: Secondary | ICD-10-CM

## 2018-08-14 DIAGNOSIS — I739 Peripheral vascular disease, unspecified: Secondary | ICD-10-CM

## 2018-08-14 DIAGNOSIS — I10 Essential (primary) hypertension: Secondary | ICD-10-CM

## 2018-08-14 DIAGNOSIS — I96 Gangrene, not elsewhere classified: Secondary | ICD-10-CM

## 2018-08-14 DIAGNOSIS — L03116 Cellulitis of left lower limb: Secondary | ICD-10-CM

## 2018-08-14 LAB — COMPREHENSIVE METABOLIC PANEL
ALBUMIN: 2.9 g/dL — AB (ref 3.5–5.0)
ALT: 8 U/L (ref 0–44)
AST: 9 U/L — ABNORMAL LOW (ref 15–41)
Alkaline Phosphatase: 48 U/L (ref 38–126)
Anion gap: 9 (ref 5–15)
BUN: 33 mg/dL — ABNORMAL HIGH (ref 8–23)
CO2: 22 mmol/L (ref 22–32)
Calcium: 9.1 mg/dL (ref 8.9–10.3)
Chloride: 107 mmol/L (ref 98–111)
Creatinine, Ser: 1.28 mg/dL — ABNORMAL HIGH (ref 0.44–1.00)
GFR calc Af Amer: 45 mL/min — ABNORMAL LOW (ref >60)
GFR calc non Af Amer: 38 mL/min — ABNORMAL LOW (ref >60)
GLUCOSE: 96 mg/dL (ref 70–99)
POTASSIUM: 5 mmol/L (ref 3.5–5.1)
SODIUM: 138 mmol/L (ref 135–145)
Total Bilirubin: 0.5 mg/dL (ref 0.3–1.2)
Total Protein: 6.3 g/dL — ABNORMAL LOW (ref 6.5–8.1)

## 2018-08-14 LAB — CBC
HEMATOCRIT: 25.1 % — AB (ref 36.0–46.0)
Hemoglobin: 7.8 g/dL — ABNORMAL LOW (ref 12.0–15.0)
MCH: 27 pg (ref 26.0–34.0)
MCHC: 31.1 g/dL (ref 30.0–36.0)
MCV: 86.9 fL (ref 78.0–100.0)
Platelets: 379 10*3/uL (ref 150–400)
RBC: 2.89 MIL/uL — ABNORMAL LOW (ref 3.87–5.11)
RDW: 15.6 % — ABNORMAL HIGH (ref 11.5–15.5)
WBC: 9.5 10*3/uL (ref 4.0–10.5)

## 2018-08-14 LAB — MRSA PCR SCREENING: MRSA by PCR: NEGATIVE

## 2018-08-14 MED ORDER — VANCOMYCIN HCL 10 G IV SOLR
1250.0000 mg | INTRAVENOUS | Status: DC
  Administered 2018-08-15: 1250 mg via INTRAVENOUS
  Filled 2018-08-14: qty 1250

## 2018-08-14 MED ORDER — DILTIAZEM HCL ER COATED BEADS 120 MG PO CP24
120.0000 mg | ORAL_CAPSULE | Freq: Every day | ORAL | Status: DC
  Administered 2018-08-14 – 2018-08-16 (×3): 120 mg via ORAL
  Filled 2018-08-14 (×3): qty 1

## 2018-08-14 MED ORDER — METHOCARBAMOL 500 MG PO TABS
500.0000 mg | ORAL_TABLET | Freq: Once | ORAL | Status: AC
  Administered 2018-08-14: 500 mg via ORAL
  Filled 2018-08-14: qty 1

## 2018-08-14 MED ORDER — ACETAMINOPHEN 325 MG PO TABS: 650.0000 mg | ORAL_TABLET | Freq: Four times a day (QID) | ORAL | Status: DC | PRN

## 2018-08-14 MED ORDER — OXYCODONE HCL 5 MG PO TABS
5.0000 mg | ORAL_TABLET | ORAL | Status: DC | PRN
  Administered 2018-08-14 – 2018-08-15 (×3): 5 mg via ORAL
  Filled 2018-08-14 (×3): qty 1

## 2018-08-14 MED ORDER — FENTANYL CITRATE (PF) 100 MCG/2ML IJ SOLN
25.0000 ug | INTRAMUSCULAR | Status: DC | PRN
  Administered 2018-08-14 – 2018-08-15 (×3): 25 ug via INTRAVENOUS
  Filled 2018-08-14 (×3): qty 2

## 2018-08-14 MED ORDER — OXYCODONE HCL 5 MG PO TABS
5.0000 mg | ORAL_TABLET | Freq: Four times a day (QID) | ORAL | Status: DC | PRN
  Administered 2018-08-14 (×2): 5 mg via ORAL
  Filled 2018-08-14 (×2): qty 1

## 2018-08-14 MED ORDER — SODIUM CHLORIDE 0.9 % IV SOLN
2.0000 g | Freq: Two times a day (BID) | INTRAVENOUS | Status: DC
  Administered 2018-08-14 – 2018-08-15 (×3): 2 g via INTRAVENOUS
  Filled 2018-08-14 (×4): qty 2

## 2018-08-14 NOTE — Consult Note (Signed)
Consultation Note Date: 08/14/2018   Patient Name: Shelby Reese  DOB: [birth year]  MRN: [id number]  Age / Sex: 83 y.o., female  PCP: [name], MD Referring Physician: Kerney Elbe, DO  Reason for Consultation: Establishing goals of care, Hospice Evaluation, Non pain symptom management, Pain control and Psychosocial/spiritual support  HPI/Patient Profile: 83 y.o. female  with past medical history of hypertension, hypothyroidism, chronic kidney disease stage III, history of hemorrhagic shock with unknown source (April 2019), severe peripheral arterial disease status post right AKA January 2019 admitted on 08/13/2018 with recurrent wound infection and pain.   Consult ordered for goals of care.   Clinical Assessment and Goals of Care: Pt seen, chart reviewed.  Patient is alert and oriented x3 but cries in pain on entering room.  Reports pain has been "OK today until 5 minutes ago."  Now reports pain is 9-10/10.    Reports that she had transitioned home with hospice, but "no longer qualified."  States that her goals still remain to be at home with good symptom management.  States she is "not really interested" in surgical options.   We discussed that in light of multiple chronic medical problems that have worsened with this acute problem, care should be focused on interventions that are likely to allow the patient to achieve goal of getting back to home and spending time with family. I discussed with her regarding heroic interventions at the end-of-life and she agrees this would not be in line with prior expressed wishes for a natural death or be likely to lead to getting well enough to go back home. Reports that she has completed DNR paperwork in the past (there is a copy of MOST with DNR in her chart).  Changed CODE STATUS to DO NOT RESUSCITATE per her request.  She then reported that she was hurting too  bad to continue conversation this evening.  Agreeable to focus on pain management tonight and f/u tomorrow to continue to discuss goals of care.   SUMMARY OF RECOMMENDATIONS   Patient is DNR/DNI Reports too much pain to really discuss goals tonight.  We discussed plan to focus on pain management today and reassess goals at follow-up tomorrow.  Code Status/Advance Care Planning:  DNR   Symptom Management:   Pain: Continue with oxycodone 5mg  every 4 hours as needed, fentanyl 62mcg for acute breakthrough pain, as well as gabapentin 200 mg twice daily.  Palliative Prophylaxis:   Aspiration, Bowel Regimen, Eye Care, Frequent Pain Assessment, Oral Care and Turn Reposition  Additional Recommendations (Limitations, Scope, Preferences):  Avoid Hospitalization, Minimize Medications, No Artificial Feeding, No Blood Transfusions, No Chemotherapy, No Hemodialysis, No Radiation, No Surgical Procedures and No Tracheostomy  Psycho-social/Spiritual:   Desire for further Chaplaincy support:no  Additional Recommendations: Referral to Community Resources   Prognosis:   < 6 months in the setting of severe peripheral arterial disease to left lower extremity with recurrent infection; status post right AKA 2019 with comorbidities of chronic kidney disease stage III hypertension Discharge Planning: To Be  Determined       Primary Diagnoses: Present on Admission: . Peripheral vascular disease ([place]) . 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 Cellulitis of left lower extremity . GERD (gastroesophageal reflux disease) . Essential hypertension . Hypothyroidism, acquired . Gangrene of right foot ([place])   I have reviewed the medical record, interviewed the patient and family, and examined the patient. The following aspects are pertinent.  Past Medical History:  Diagnosis Date  . Allergy   . Anemia   . CKD (chronic kidney disease), stage II   . Fibromyalgia   . GERD  (gastroesophageal reflux disease)   . Hiatal hernia   . Hypertension   . Hypothyroid   . Lymphedema    venous insufficency  . Osteoarthritis   . Peripheral vascular disease ([place])   . Ulcer of knee ([place])    right  . Urinary incontinence   . Varicose veins   . Venous insufficiency    Social History   Socioeconomic History  . Marital status: Married    Spouse name: Not on file  . Number of children: 0  . Years of education: Not on file  . Highest education level: Not on file  Occupational History    Comment: retired Marine scientist.   Social Needs  . Financial resource strain: Not on file  . Food insecurity:    Worry: Not on file    Inability: Not on file  . Transportation needs:    Medical: Not on file    Non-medical: Not on file  Tobacco Use  . Smoking status: Former Smoker    Packs/day: 0.50    Years: 10.00    Pack years: 5.00    Last attempt to quit: 11/19/1979    Years since quitting: 38.7  . Smokeless tobacco: Never Used  Substance and Sexual Activity  . Alcohol use: Yes    Alcohol/week: 14.0 standard drinks    Types: 14 Glasses of wine per week    Comment: 2 glasses of wine with dinner  . Drug use: No  . Sexual activity: Not on file  Lifestyle  . Physical activity:    Days per week: Not on file    Minutes per session: Not on file  . Stress: Not on file  Relationships  . Social connections:    Talks on phone: Not on file    Gets together: Not on file    Attends religious service: Not on file    Active member of club or organization: Not on file    Attends meetings of clubs or organizations: Not on file    Relationship status: Not on file  Other Topics Concern  . Not on file  Social History Narrative  . Not on file   Family History  Problem Relation Age of Onset  . Stroke Mother   . Varicose Veins Mother   . Cancer Father        prostate  . Stroke Sister   . Heart disease Sister   . Varicose Veins Sister   . Stroke Maternal Grandmother   . Varicose  Veins Sister    Scheduled Meds: . aspirin EC  81 mg Oral QPM  . calcium-vitamin D  1 tablet Oral Q breakfast  . diltiazem  120 mg Oral Daily  . diphenhydrAMINE  25 mg Oral TID  . famotidine  20 mg Oral Daily  . ferrous sulfate  650 mg Oral QPM  . gabapentin  200 mg Oral BID  .  heparin  5,000 Units Subcutaneous Q8H  . levothyroxine  175 mcg Oral QAC breakfast  . loratadine  10 mg Oral Daily  . magnesium oxide  200 mg Oral QPM  . multivitamin with minerals  1 tablet Oral QPM  . vitamin C  500 mg Oral QPM   Continuous Infusions: . sodium chloride 100 mL/hr (08/14/18 1706)  . ceFEPime (MAXIPIME) IV    . [START ON 08/15/2018] vancomycin     PRN Meds:.acetaminophen, fentaNYL (SUBLIMAZE) injection, ondansetron **OR** ondansetron (ZOFRAN) IV, oxyCODONE Medications Prior to Admission:  Prior to Admission medications   Medication Sig Start Date End Date Taking? Authorizing Provider  acetaminophen (TYLENOL) 500 MG tablet Take 1,000 mg by mouth daily.   Yes [provider]  aspirin EC 81 MG tablet Take 81 mg by mouth every evening.    Yes [provider]  benazepril (LOTENSIN) 40 MG tablet Take 40 mg by mouth daily.   Yes [provider]  Calcium Carbonate-Vitamin D (CALCIUM 600+D) 600-400 MG-UNIT per tablet Take 1 tablet by mouth every evening.    Yes [provider]  cetirizine (ZYRTEC) 10 MG tablet Take 10 mg by mouth at bedtime.    Yes [provider]  diltiazem (TIAZAC) 120 MG 24 hr capsule HOLD until seen by your PCP Patient taking differently: Take 120 mg by mouth daily.  03/18/18  Yes Oretha Milch D, MD  diphenhydrAMINE (BENADRYL) 25 mg capsule Take 25 mg by mouth 3 (three) times daily.    Yes [provider]  famotidine (PEPCID) 40 MG tablet Take 40 mg by mouth every evening.    Yes [provider]  ferrous sulfate 325 (65 FE) MG tablet Take 487.5 mg by mouth every evening.    Yes [provider]  FIBER PO Take 1  capsule by mouth 2 (two) times daily.   Yes [provider]  gabapentin (NEURONTIN) 100 MG capsule Take 200 mg by mouth 2 (two) times daily.    Yes [provider]  ibuprofen (ADVIL,MOTRIN) 200 MG tablet Take 400 mg by mouth daily.   Yes [provider]  levothyroxine (SYNTHROID, LEVOTHROID) 175 MCG tablet Take 175 mcg by mouth daily before breakfast.   Yes [provider]  Magnesium 125 MG CAPS Take 125 mg by mouth every evening.    Yes [provider]  Multiple Vitamins-Minerals (MULTIVITAMIN WITH MINERALS) tablet Take 1 tablet by mouth every evening.    Yes [provider]  nystatin-triamcinolone (MYCOLOG II) cream Apply 1 application topically See admin instructions. Apply topically to left leg every afternoon for wound care   Yes [provider]  vitamin C (ASCORBIC ACID) 500 MG tablet Take 500 mg by mouth every evening.    Yes [provider]  glucosamine-chondroitin 500-400 MG tablet Take 1 tablet by mouth every evening.     [provider]  oxyCODONE-acetaminophen (PERCOCET) 10-325 MG tablet Take 1 tablet by mouth every 4 (four) hours as needed for pain. Patient not taking: Reported on 06/29/2018 04/29/18   Dalia Heading, PA-C  pantoprazole (PROTONIX) 40 MG tablet Take 1 tablet (40 mg total) by mouth daily. Patient not taking: Reported on 06/29/2018 12/24/17   Bonnell Public, MD   Allergies  Allergen Reactions  . Cinnamon Hives  . Ciprofloxacin Other (See Comments)    TREMORS  . Diovan [Valsartan] Other (See Comments)    Extreme vertigo  . Food Diarrhea and Other (See Comments)    ORANGE JUICE  UPSET STOMACH  . Latex Rash and Other (See Comments)    Rash/inflammation due to exposure  . Nitrofuran Derivatives Hives and Rash    "Full body rash"  . Penicillins Hives and Swelling    *tolerated Ceftriaxone September 2018 Has patient had a PCN reaction causing immediate rash, facial/tongue/throat  swelling, SOB or lightheadedness with hypotension:No--severe irritation at the injection site Has patient had a PCN reaction causing severe rash involving mucus membranes or skin necrosis:Unknown Has patient had a PCN reaction that required hospitalization:No Has patient had a PCN reaction occurring within the last 10 years:Yes If all of the above answers are "NO", then may proceed with  . Bactrim [Sulfamethoxazole-Trimethoprim] Diarrhea and Nausea Only  . Other Rash    Mycins, Strawberry, Oranges  . Sulfa Antibiotics Diarrhea and Nausea Only   Review of Systems  Unable to perform ROS: Other  Patient reports too much pain.  Physical Exam  Constitutional: She is oriented to person, place, and time. She appears distressed.  Frail elderly female, Crying in pain  HENT:  Head: Normocephalic and atraumatic.  Neck: Normal range of motion.  Cardiovascular: Normal rate.  Pulmonary/Chest: Effort normal.  Musculoskeletal: Normal range of motion.  Right AKA Left lower extremity erythema  Neurological: She is alert and oriented to person, place, and time.  Skin: Skin is warm and dry. There is pallor.  Psychiatric: She has a normal mood and affect. Her behavior is normal. Judgment and thought content normal.  Nursing note and vitals reviewed.   Vital Signs: BP (!) 92/46   Pulse 70   Temp 98.2 F (36.8 C) (Oral)   Resp 18   Ht 5\' 5"  (1.651 m)   Wt 81.7 kg   SpO2 98%   BMI 29.97 kg/m  Pain Scale: 0-10   Pain Score: 9    SpO2: SpO2: 98 % O2 Device:SpO2: 98 % O2 Flow Rate: .O2 Flow Rate (L/min): 2 L/min  IO: Intake/output summary:   Intake/Output Summary (Last 24 hours) at 08/14/2018 1836 Last data filed at 08/14/2018 1719 Gross per 24 hour  Intake 1980.26 ml  Output 850 ml  Net 1130.26 ml    LBM: Last BM Date: 08/12/18 Baseline Weight: Weight: 81.7 kg Most recent weight: Weight: 81.7 kg     Palliative Assessment/Data:   Time Total: 60 min Greater than 50%  of this time  was spent counseling and coordinating care related to the above assessment and plan.  Signed by: Micheline Rough, MD   Please contact Palliative Medicine Team phone at [phone] for questions and concerns.  For individual provider: See Shea Evans

## 2018-08-14 NOTE — Progress Notes (Signed)
PROGRESS NOTE    Shelby Reese  WGN:[id number] DOB: 1935/05/20 DOA: 08/13/2018 PCP: [name], MD  Brief Narrative:  HPI per Dr. Gala Romney on 08/13/18 Shelby Reese is a 83 y.o. female with medical history significant of peripheral vascular disease status post right AKA, left lower extremity venous ulcer which is infected being followed at the wound clinic, hypertension, hypothyroidism, morbid obesity and chronic kidney disease stage III who was sent over from the wound care center due to worsening pain and infected left lower extremity venous ulcer.  Patient is complaining of 9 out of 10 pain.  She was seen by Dr. Quentin Cornwall in the wound care center.  She knows she may require below-knee amputation.  She is having significant fever in addition.  Also swollen leg with chronic lymphedema.  Patient's also appeared infected.  She is being admitted for treatment.  Patient has gangrenous area in the anterior ankle wound.  **Orthopedic surgery is been consulted and awaiting evaluation as well as palliative care medicine.  Patient has having significant pain so I did pain regimen.  We will continue antibiotics in the interim and have orthopedics weigh in as well as palliative care discussion.  Assessment & Plan:   Principal Problem:   Gangrene of left lower extremity due to atherosclerosis [facility]) Active Problems:   Hypothyroidism, acquired   Essential hypertension   Peripheral vascular disease (HCC)   GERD (gastroesophageal reflux disease)   Cellulitis of left lower extremity   Gangrene of right foot (HCC)  Pain and Gangrene of the Left Lower Extremity -Secondary to vascular ulcer.  Infected,.   -Will admit patient and start on IV antibiotics.   -Dr. Quentin Cornwall aware and will follow patient in the hospital. -Most likely she might require amputation. -[place] Nurse for further evaluation and recommendations but did not evaluate the patient and per nurse "out of scope of practice" based on  documentation -Patient was given Robaxin 5 mg p.o. once, tramadol 50 mg p.o. once, as well as IV morphine yesterday twice -Pain control with acetaminophen 650 mg p.o. every 6 PRN for mild pain, oxycodone 5 mg p.o. every 6 PRN for moderate pain, as well as fentanyl 25 mcg IV every 2 as needed for severe pain -CRP was 13.3 and Sed Rate was 83 -WBC was 11.6 on admission and improved to 9.5 -Lactic acid level was normal -Continue with antibiotics as below -Orthopedics Consulted and awaiting Evaluation -Palliative Care consulted for further evaluation and recommendations  Severe Peripheral Vascular Disease with history of right AKA -Advanced and well-known.   -Will need Orthopedic Surgery Input   -Vascular Studies have been done in the past and attempted to repeat an ABI today but patient refused -Continue with aspirin 81 mg p.o. daily  Cellulitis of the Left Lower Extremity -Continue IV antibiotics as indicated. -Obtain an aerobic and anaerobic wound culture from the left leg wound -Patient was started on IV cefepime and will add IV vancomycin because it is purulent and necrotic -Awaiting orthopedic surgery evaluation and recommendations  GERD -Continue with home famotidine 20 mg p.o. daily  Essential Hypertension -Blood pressure elevated in the setting of pain and infection.  Resume home medication with diltiazem 120 mg p.o. daily  Acute Kidney Injury on CKD stage II -Continued hydration with normal saline at 125 mL's per hour but will decrease rate to 100 and mils per hour today -Avoid nephrotoxic medications if possible and will stop ibuprofen 400 mg p.o. daily -Continue monitor and trend renal function -  Repeat CMP in the a.m.  Hypothyroidism -Check TSH -Continue with Levothyroxine 175 mcg p.o. daily before breakfast  Seasonal Allergies -Continue with Loratadine 10 mg daily along with diphenhydramine 25 mg p.o. 3 times daily  Normocytic Anemia -Likely in the setting of  CKD -Patient's hemoglobin/hematocrit was 8.8/20.2 on admission is now 7.8/25.1 -Continue with ferrous sulfate 650 mill grams p.o. nightly -Check anemia panel in the a.m. -Continue to monitor for signs and symptoms of bleeding -Repeat CBC in a.m.  DVT prophylaxis: Heparin 5000 units subcu every 8 hours Code Status: FULL CODE Family Communication: No family present at bedside but Care-Giver at bedside  Disposition Plan: Pending further evaluation and patient discussion with Orthopedic Surgery and Palliative Care Medicine   Consultants:   Orthopedic Surgery  Palliative Care Medicine    Procedures: None   Antimicrobials:  Anti-infectives (From admission, onward)   Start     Dose/Rate Route Frequency Ordered Stop   08/15/18 1800  vancomycin (VANCOCIN) 1,250 mg in sodium chloride 0.9 % 250 mL IVPB     1,250 mg 166.7 mL/hr over 90 Minutes Intravenous Every 48 hours 08/14/18 1133     08/14/18 2200  ceFEPIme (MAXIPIME) 2 g in sodium chloride 0.9 % 100 mL IVPB     2 g 200 mL/hr over 30 Minutes Intravenous Every 12 hours 08/14/18 1128     08/14/18 0600  ceFEPIme (MAXIPIME) 1 g in sodium chloride 0.9 % 100 mL IVPB  Status:  Discontinued     1 g 200 mL/hr over 30 Minutes Intravenous Every 12 hours 08/13/18 1909 08/14/18 1128   08/13/18 1900  ceFEPIme (MAXIPIME) 2 g in sodium chloride 0.9 % 100 mL IVPB  Status:  Discontinued     2 g 200 mL/hr over 30 Minutes Intravenous  Once 08/13/18 1848 08/13/18 1903   08/13/18 1700  vancomycin (VANCOCIN) 2,000 mg in sodium chloride 0.9 % 500 mL IVPB     2,000 mg 250 mL/hr over 120 Minutes Intravenous  Once 08/13/18 1646 08/13/18 2124   08/13/18 1700  ceFEPIme (MAXIPIME) 2 g in sodium chloride 0.9 % 100 mL IVPB     2 g 200 mL/hr over 30 Minutes Intravenous  Once 08/13/18 1646 08/13/18 1827     Subjective: Seen and examined at bedside and complained of significant left leg pain and burning to the point of tears.  No chest pain, lightheadedness or  dizziness.  States the wound is progressively gotten worse in the last 2 to 3 weeks.  No other concerns or complaints at at this time however states that she does not want any more surgery and wants to speak with orthopedics and may want to go back on hospice.  Objective: Vitals:   08/14/18 0539 08/14/18 0558 08/14/18 0916 08/14/18 1317  BP: (!) 142/56  (!) 152/61 (!) 92/46  Pulse: 91   70  Resp:    18  Temp: 99.5 F (37.5 C)   98.2 F (36.8 C)  TempSrc: Oral   Oral  SpO2: (!) 88% 92%  98%  Weight:      Height:        Intake/Output Summary (Last 24 hours) at 08/14/2018 1500 Last data filed at 08/14/2018 1315 Gross per 24 hour  Intake 1740.26 ml  Output 550 ml  Net 1190.26 ml   Filed Weights   08/13/18 1541  Weight: 81.7 kg   Examination: Physical Exam:  Constitutional: WN/WD elderly Caucasian female NAD and appears anxious and uncomfortable due to pain Eyes: Lids  and conjunctivae normal, sclerae anicteric  ENMT: External Ears, Nose appear normal. Grossly normal hearing. Mucous membranes are moist. Neck: Appears normal, supple, no cervical masses, normal ROM, no appreciable thyromegaly; no JVD Respiratory: Diminished to auscultation bilaterally, no wheezing, rales, rhonchi or crackles. Normal respiratory effort and patient is not tachypenic. No accessory muscle use.  Cardiovascular: RRR, no murmurs / rubs / gallops. S1 and S2 auscultated.  Abdomen: Soft, non-tender, non-distended. No masses palpated. No appreciable hepatosplenomegaly. Bowel sounds positive x4.  GU: Deferred. Musculoskeletal: Right AKA Skin: Large Ulcer with necrosis and purulence on Left leg Neurologic: CN 2-12 grossly intact with no focal deficits. Romberg sign and cerebellar reflexes not assessed.  Psychiatric: Normal judgment and insight. Alert and oriented x 3. Anxious and tearful mood and affect.   Data Reviewed: I have personally reviewed following labs and imaging studies  CBC: Recent Labs  Lab  08/13/18 1725 08/14/18 0455  WBC 11.6* 9.5  NEUTROABS 8.7*  --   HGB 8.8* 7.8*  HCT 28.2* 25.1*  MCV 86.2 86.9  PLT 421* 841   Basic Metabolic Panel: Recent Labs  Lab 08/13/18 1725 08/14/18 0455  NA 136 138  K 4.5 5.0  CL 103 107  CO2 23 22  GLUCOSE 88 96  BUN 33* 33*  CREATININE 1.20* 1.28*  CALCIUM 9.6 9.1   GFR: Estimated Creatinine Clearance: 37 mL/min (A) (by C-G formula based on SCr of 1.28 mg/dL (H)). Liver Function Tests: Recent Labs  Lab 08/14/18 0455  AST 9*  ALT 8  ALKPHOS 48  BILITOT 0.5  PROT 6.3*  ALBUMIN 2.9*   No results for input(s): LIPASE, AMYLASE in the last 168 hours. No results for input(s): AMMONIA in the last 168 hours. Coagulation Profile: No results for input(s): INR, PROTIME in the last 168 hours. Cardiac Enzymes: No results for input(s): CKTOTAL, CKMB, CKMBINDEX, TROPONINI in the last 168 hours. BNP (last 3 results) No results for input(s): PROBNP in the last 8760 hours. HbA1C: No results for input(s): HGBA1C in the last 72 hours. CBG: No results for input(s): GLUCAP in the last 168 hours. Lipid Profile: No results for input(s): CHOL, HDL, LDLCALC, TRIG, CHOLHDL, LDLDIRECT in the last 72 hours. Thyroid Function Tests: No results for input(s): TSH, T4TOTAL, FREET4, T3FREE, THYROIDAB in the last 72 hours. Anemia Panel: No results for input(s): VITAMINB12, FOLATE, FERRITIN, TIBC, IRON, RETICCTPCT in the last 72 hours. Sepsis Labs: Recent Labs  Lab 08/13/18 1733  LATICACIDVEN 0.68    Recent Results (from the past 240 hour(s))  Aerobic Culture (superficial specimen)     Status: None   Collection Time: 08/05/18  1:50 PM  Result Value Ref Range Status   Specimen Description   Final    WOUND RIGHT LEG Performed at [facility], [place] [address], [place], [place] [address]    Special Requests   Final    VENOUS STASIS ULCER Performed at [facility], [place] [address], [place], [place]  [address]    Gram Stain   Final    NO WBC SEEN NO ORGANISMS SEEN Performed at [facility], [place] [address] [place], [place] [address]    Culture   Final    FEW METHICILLIN RESISTANT STAPHYLOCOCCUS AUREUS RARE PSEUDOMONAS AERUGINOSA    Report Status 08/08/2018 FINAL  Final   Organism ID, Bacteria METHICILLIN RESISTANT STAPHYLOCOCCUS AUREUS  Final   Organism ID, Bacteria PSEUDOMONAS AERUGINOSA  Final      Susceptibility   Methicillin resistant staphylococcus  aureus - MIC*    CIPROFLOXACIN >=8 RESISTANT Resistant     ERYTHROMYCIN >=8 RESISTANT Resistant     GENTAMICIN <=0.5 SENSITIVE Sensitive     OXACILLIN >=4 RESISTANT Resistant     TETRACYCLINE <=1 SENSITIVE Sensitive     VANCOMYCIN <=0.5 SENSITIVE Sensitive     TRIMETH/SULFA >=320 RESISTANT Resistant     CLINDAMYCIN <=0.25 SENSITIVE Sensitive     RIFAMPIN <=0.5 SENSITIVE Sensitive     Inducible Clindamycin NEGATIVE Sensitive     * FEW METHICILLIN RESISTANT STAPHYLOCOCCUS AUREUS   Pseudomonas aeruginosa - MIC*    CEFTAZIDIME 4 SENSITIVE Sensitive     CIPROFLOXACIN 0.5 SENSITIVE Sensitive     GENTAMICIN <=1 SENSITIVE Sensitive     IMIPENEM 1 SENSITIVE Sensitive     PIP/TAZO 32 SENSITIVE Sensitive     CEFEPIME 8 SENSITIVE Sensitive     * RARE PSEUDOMONAS AERUGINOSA  Blood culture (routine x 2)     Status: None (Preliminary result)   Collection Time: 08/13/18  5:41 PM  Result Value Ref Range Status   Specimen Description   Final    BLOOD RIGHT WRIST Performed at [place] [address], [place], [place] [address]    Special Requests   Final    BOTTLES DRAWN AEROBIC AND ANAEROBIC Blood Culture adequate volume Performed at [place] [address] [place], [place] [address]    Culture   Final    NO GROWTH < 24 HOURS Performed at [place] [address] [place], [place] [address]    Report Status PENDING  Incomplete  Blood culture (routine x 2)     Status:  None (Preliminary result)   Collection Time: 08/13/18  5:41 PM  Result Value Ref Range Status   Specimen Description   Final    BLOOD LEFT ARM Performed at [place] [address] [place], [place] [address]    Special Requests   Final    BOTTLES DRAWN AEROBIC AND ANAEROBIC Blood Culture results may not be optimal due to an inadequate volume of blood received in culture bottles Performed at [facility] [address] [place], [place] [address]    Culture   Final    NO GROWTH < 24 HOURS Performed at [place] [address], [place], [place] [address]    Report Status PENDING  Incomplete  Aerobic/Anaerobic Culture (surgical/deep wound)     Status: None (Preliminary result)   Collection Time: 08/14/18  9:19 AM  Result Value Ref Range Status   Specimen Description   Final    WOUND LEFT LEG Performed at [place] [address], [place], [place] [address]    Special Requests   Final    Normal Performed at [facility], [place] [address] [place], [place] [address]    Gram Stain   Final    FEW WBC PRESENT, PREDOMINANTLY PMN RARE GRAM POSITIVE COCCI Performed at [facility], [place] [address] [place], [place] [address]    Culture PENDING  Incomplete   Report Status PENDING  Incomplete  MRSA PCR Screening     Status: None   Collection Time: 08/14/18 10:15 AM  Result Value Ref Range Status   MRSA by PCR NEGATIVE NEGATIVE Final    Comment:        The GeneXpert MRSA Assay (FDA approved for NASAL specimens only), is one component of a comprehensive MRSA colonization surveillance  program. It is not intended to diagnose MRSA infection nor to guide or monitor treatment for MRSA infections. Performed at [facility], [place] [address] [place], [place] [address]     Radiology Studies: Dg Tibia/fibula Left  Result Date: 08/13/2018 CLINICAL DATA:  Left leg wound  EXAM: LEFT TIBIA AND FIBULA - 2 VIEW COMPARISON:  None. FINDINGS: No acute bony abnormality. Specifically, no fracture, subluxation, or dislocation. No radiographic changes of osteomyelitis. Degenerative changes in the left knee and ankle. IMPRESSION: No acute bony abnormality. Electronically Signed   By: Rolm Baptise M.D.   On: 08/13/2018 18:16   Dg Foot Complete Left  Result Date: 08/13/2018 CLINICAL DATA:  Left foot and leg wound. EXAM: LEFT FOOT - COMPLETE 3+ VIEW COMPARISON:  06/29/2018 FINDINGS: Previously seen bone irregularity and lucency at the base of the left 5th metatarsal appears improved. Diffuse osteopenia. Decreasing soft tissue swelling within the foot. No visible radiographic changes of osteomyelitis currently. IMPRESSION: Improved appearance at the base of the left 5th metatarsal with decreasing lucency/irregularity. Osteopenia. Electronically Signed   By: Rolm Baptise M.D.   On: 08/13/2018 18:15   Scheduled Meds: . aspirin EC  81 mg Oral QPM  . calcium-vitamin D  1 tablet Oral Q breakfast  . diltiazem  120 mg Oral Daily  . diphenhydrAMINE  25 mg Oral TID  . famotidine  20 mg Oral Daily  . ferrous sulfate  650 mg Oral QPM  . gabapentin  200 mg Oral BID  . heparin  5,000 Units Subcutaneous Q8H  . ibuprofen  400 mg Oral Daily  . levothyroxine  175 mcg Oral QAC breakfast  . loratadine  10 mg Oral Daily  . magnesium oxide  200 mg Oral QPM  . multivitamin with minerals  1 tablet Oral QPM  . vitamin C  500 mg Oral QPM   Continuous Infusions: . sodium chloride 125 mL/hr (08/14/18 1402)  . ceFEPime (MAXIPIME) IV    . [START ON 08/15/2018] vancomycin      LOS: 1 day   Kerney Elbe, DO [facility] PAGER is on [place]  If 7PM-7AM, please contact night-coverage [email or web address] Password [email or web address] 08/14/2018, 3:00 PM

## 2018-08-14 NOTE — Consult Note (Signed)
[facility] Nurse wound consult note I spoke with Dr. Alfredia Ferguson via telephone and requested a surgical consult.  I was informed that Dr. Quentin Cornwall or some other surgeon would be seeing the patient today.  Gangrene exceeds the scope of the [facility] nurse treatment ability.  Therefore, I am signing off on the consult. Reason for Consult: Left extremity wound with probable gangrene according to MD notes.  Val Riles, RN, MSN, CWOCN, CNS-BC, pager [phone]

## 2018-08-14 NOTE — Progress Notes (Addendum)
Patient refusing to have doppler exam. Dr. Alfredia Ferguson notified.

## 2018-08-14 NOTE — Progress Notes (Signed)
Attempted ABI:  Patient refused vascular exam. Please reorder if patient decides to have exam.   Abram Sander 08/14/2018 9:46 AM

## 2018-08-14 NOTE — Progress Notes (Signed)
Pharmacy Antibiotic Note  Shelby Reese is a 83 y.o. female admitted on 08/13/2018 from wound care clinic with worsening chronic left leg wounds. .  Concern for acute cellutlitis with new necrotic/gangrenous tissue on the anterior ankle wound.   Pharmacy has been consulted for cefepime and vancomycin dosing.  Pt has PCN allergy, but has tolerated numerous doses of cephalosporins in the past.   Today, 08/14/18  WBC 9.5 WNL  SCr 1.3, CrCl ~ 37 mL/min  CRP 13.3, Sed rate 83  Afebrile  Plan:  Increase cefepime to 2 g IV q12h  Vancomycin 2000 mg IV received in ED on 9/26. Initiate vancomycin 1250 mg IV q48h  Goal AUC 400-500  Closely monitor renal function  Follow cultures, clinical course, and follow for ability to de-escalate antibiotics  Height: 5\' 5"  (165.1 cm) Weight: 180 lb 1.9 oz (81.7 kg) IBW/kg (Calculated) : 57  Temp (24hrs), Avg:99.3 F (37.4 C), Min:98.3 F (36.8 C), Max:99.8 F (37.7 C)  Recent Labs  Lab 08/13/18 1725 08/13/18 1733 08/14/18 0455  WBC 11.6*  --  9.5  CREATININE 1.20*  --  1.28*  LATICACIDVEN  --  0.68  --     Estimated Creatinine Clearance: 37 mL/min (A) (by C-G formula based on SCr of 1.28 mg/dL (H)).    Allergies  Allergen Reactions  . Cinnamon Hives  . Ciprofloxacin Other (See Comments)    TREMORS  . Diovan [Valsartan] Other (See Comments)    Extreme vertigo  . Food Diarrhea and Other (See Comments)    ORANGE JUICE   UPSET STOMACH  . Latex Rash and Other (See Comments)    Rash/inflammation due to exposure  . Nitrofuran Derivatives Hives and Rash    "Full body rash"  . Penicillins Hives and Swelling    *tolerated Ceftriaxone September 2018 Has patient had a PCN reaction causing immediate rash, facial/tongue/throat swelling, SOB or lightheadedness with hypotension:No--severe irritation at the injection site Has patient had a PCN reaction causing severe rash involving mucus membranes or skin necrosis:Unknown Has patient had a PCN  reaction that required hospitalization:No Has patient had a PCN reaction occurring within the last 10 years:Yes If all of the above answers are "NO", then may proceed with  . Bactrim [Sulfamethoxazole-Trimethoprim] Diarrhea and Nausea Only  . Other Rash    Mycins, Strawberry, Oranges  . Sulfa Antibiotics Diarrhea and Nausea Only    Antimicrobials this admission: 9/26 vanc >> 9/26 cefepime >>  Dose adjustments this admission: 9/27 cefepime 1 g IV q12h --> cefepime 2 g IV q12h  Microbiology results: 9/26 BCx: ngtd 9/27 Wound: Sent  Thank you for allowing pharmacy to be a part of this patient's care.  Lenis Noon, PharmD, BCPS 08/14/18 11:38 AM

## 2018-08-15 DIAGNOSIS — R52 Pain, unspecified: Secondary | ICD-10-CM

## 2018-08-15 DIAGNOSIS — N183 Chronic kidney disease, stage 3 (moderate): Secondary | ICD-10-CM

## 2018-08-15 DIAGNOSIS — L03116 Cellulitis of left lower limb: Secondary | ICD-10-CM

## 2018-08-15 DIAGNOSIS — T148XXA Other injury of unspecified body region, initial encounter: Secondary | ICD-10-CM

## 2018-08-15 DIAGNOSIS — D649 Anemia, unspecified: Secondary | ICD-10-CM

## 2018-08-15 DIAGNOSIS — I739 Peripheral vascular disease, unspecified: Secondary | ICD-10-CM

## 2018-08-15 LAB — CBC WITH DIFFERENTIAL/PLATELET
Basophils Absolute: 0 10*3/uL (ref 0.0–0.1)
Basophils Relative: 0 %
EOS PCT: 7 %
Eosinophils Absolute: 0.6 10*3/uL (ref 0.0–0.7)
HCT: 23.2 % — ABNORMAL LOW (ref 36.0–46.0)
Hemoglobin: 7 g/dL — ABNORMAL LOW (ref 12.0–15.0)
LYMPHS ABS: 1.4 10*3/uL (ref 0.7–4.0)
LYMPHS PCT: 15 %
MCH: 26.5 pg (ref 26.0–34.0)
MCHC: 30.2 g/dL (ref 30.0–36.0)
MCV: 87.9 fL (ref 78.0–100.0)
MONOS PCT: 11 %
Monocytes Absolute: 1 10*3/uL (ref 0.1–1.0)
NEUTROS ABS: 5.9 10*3/uL (ref 1.7–7.7)
Neutrophils Relative %: 67 %
PLATELETS: 345 10*3/uL (ref 150–400)
RBC: 2.64 MIL/uL — AB (ref 3.87–5.11)
RDW: 15.6 % — AB (ref 11.5–15.5)
WBC: 8.9 10*3/uL (ref 4.0–10.5)

## 2018-08-15 LAB — PHOSPHORUS: PHOSPHORUS: 3.4 mg/dL (ref 2.5–4.6)

## 2018-08-15 LAB — MAGNESIUM: MAGNESIUM: 1.9 mg/dL (ref 1.7–2.4)

## 2018-08-15 LAB — COMPREHENSIVE METABOLIC PANEL
ALT: 8 U/L (ref 0–44)
ANION GAP: 5 (ref 5–15)
AST: 10 U/L — ABNORMAL LOW (ref 15–41)
Albumin: 2.6 g/dL — ABNORMAL LOW (ref 3.5–5.0)
Alkaline Phosphatase: 42 U/L (ref 38–126)
BUN: 29 mg/dL — ABNORMAL HIGH (ref 8–23)
CHLORIDE: 112 mmol/L — AB (ref 98–111)
CO2: 22 mmol/L (ref 22–32)
CREATININE: 1.21 mg/dL — AB (ref 0.44–1.00)
Calcium: 8.7 mg/dL — ABNORMAL LOW (ref 8.9–10.3)
GFR, EST AFRICAN AMERICAN: 48 mL/min — AB (ref >60)
GFR, EST NON AFRICAN AMERICAN: 41 mL/min — AB (ref >60)
Glucose, Bld: 89 mg/dL (ref 70–99)
Potassium: 4.7 mmol/L (ref 3.5–5.1)
Sodium: 139 mmol/L (ref 135–145)
Total Bilirubin: 0.5 mg/dL (ref 0.3–1.2)
Total Protein: 6 g/dL — ABNORMAL LOW (ref 6.5–8.1)

## 2018-08-15 MED ORDER — OXYCODONE HCL 5 MG PO TABS
10.0000 mg | ORAL_TABLET | Freq: Four times a day (QID) | ORAL | Status: DC
  Administered 2018-08-16 (×3): 10 mg via ORAL
  Filled 2018-08-15 (×4): qty 2

## 2018-08-15 MED ORDER — OXYCODONE HCL 5 MG PO TABS
5.0000 mg | ORAL_TABLET | ORAL | Status: DC | PRN
  Administered 2018-08-15 – 2018-08-16 (×4): 10 mg via ORAL
  Filled 2018-08-15 (×4): qty 2

## 2018-08-15 MED ORDER — FENTANYL CITRATE (PF) 100 MCG/2ML IJ SOLN
25.0000 ug | INTRAMUSCULAR | Status: DC | PRN
  Administered 2018-08-15 – 2018-08-16 (×2): 25 ug via INTRAVENOUS
  Filled 2018-08-15 (×2): qty 2

## 2018-08-15 MED ORDER — SODIUM CHLORIDE 0.9% IV SOLUTION
Freq: Once | INTRAVENOUS | Status: AC
  Administered 2018-08-15: 13:00:00 via INTRAVENOUS

## 2018-08-15 MED ORDER — OXYCODONE HCL 5 MG PO TABS
10.0000 mg | ORAL_TABLET | ORAL | Status: AC
  Administered 2018-08-15: 10 mg via ORAL
  Filled 2018-08-15: qty 2

## 2018-08-15 NOTE — Consult Note (Signed)
Hospital Consult    Reason for Consult: Left leg tissue loss Referring Physician: Hospitalist MRN #:  [id number]  History of Present Illness: This is a 83 y.o. female well-known to the vascular surgery service that vascular was reengaged to evaluate her left leg in the setting of venous insufficiency with open wound as well as arterial insufficiency.  She previously underwent a R AKA with Dr. Oneida Alar on 12/15/17 that has healed without issue.  She also had extensive tissue loss in her left leg and AKA had been recommended by Dr. Oneida Alar and Dr. Trula Slade which she refused.  In an effort to exhaust all options most recently she underwent a left common femoral atherectomy with left SFA atherectomy and stenting by Dr. Trula Slade on 03/03/2018.  No real improvement in wound healing since surgery in April.  Patient does not walk.  States has not even used her left leg to pivot in over one month given continued decline.  Immediately stated "I do not want surgery, I want Hospice."  Has home nursing care.  Admitted for cellulitis of her leg.  No fevers at this time.  WBC normal.  Past Medical History:  Diagnosis Date  . Allergy   . Anemia   . CKD (chronic kidney disease), stage II   . Fibromyalgia   . GERD (gastroesophageal reflux disease)   . Hiatal hernia   . Hypertension   . Hypothyroid   . Lymphedema    venous insufficency  . Osteoarthritis   . Peripheral vascular disease ([place])   . Ulcer of knee ([place])    right  . Urinary incontinence   . Varicose veins   . Venous insufficiency     Past Surgical History:  Procedure Laterality Date  . AMPUTATION Right 12/15/2017   Procedure: AMPUTATION ABOVE KNEE, RIGHT;  Surgeon: Elam Dutch, MD;  Location: [facility];  Service: Vascular;  Laterality: Right;  . APPLICATION OF WOUND VAC Right 07/18/2017   Procedure: APPLICATION OF WOUND VAC;  Surgeon: [name], MD;  Location: [facility];  Service: Orthopedics;  Laterality: Right;  . CATARACT EXTRACTION W/  INTRAOCULAR LENS  IMPLANT, BILATERAL    . COLONOSCOPY    . COLONOSCOPY WITH PROPOFOL N/A 03/12/2018   Procedure: COLONOSCOPY WITH PROPOFOL;  Surgeon: Clarene Essex, MD;  Location: [facility];  Service: Endoscopy;  Laterality: N/A;  . ESOPHAGOGASTRODUODENOSCOPY Left 03/09/2018   Procedure: ESOPHAGOGASTRODUODENOSCOPY (EGD);  Surgeon: Ronnette Juniper, MD;  Location: [facility];  Service: Gastroenterology;  Laterality: Left;  . I&D EXTREMITY Right 07/18/2017   Procedure: IRRIGATION AND DEBRIDEMENT RIGHT KNEE;  Surgeon: [name], MD;  Location: [facility];  Service: Orthopedics;  Laterality: Right;  . I&D EXTREMITY Right 07/23/2017   Procedure: REPEAT IRRIGATION AND DEBRIDEMENT RIGHT KNEE;  Surgeon: [name], MD;  Location: [employer];  Service: Orthopedics;  Laterality: Right;  . I&D EXTREMITY Right 11/28/2017   Procedure: IRRIGATION AND DEBRIDEMENT RIGHT KNEE , APPLY INSTILLATION VAC;  Surgeon: [name], MD;  Location: [facility];  Service: Orthopedics;  Laterality: Right;  . I&D EXTREMITY Right 12/03/2017   Procedure: REPEAT IRRIGATION AND DEBRIDEMENT RIGHT KNEE AND APPLICATION OF A WOUND VAC.;  Surgeon: [name], MD;  Location: [facility];  Service: Orthopedics;  Laterality: Right;  . KNEE ARTHROSCOPY Left    menisectomy  . LOWER EXTREMITY ANGIOGRAPHY N/A 03/03/2018   Procedure: LOWER EXTREMITY ANGIOGRAPHY;  Surgeon: Serafina Mitchell, MD;  Location: [facility];  Service: Cardiovascular;  Laterality: N/A;  .  MULTIPLE TOOTH EXTRACTIONS    . PERIPHERAL VASCULAR ATHERECTOMY Left 03/03/2018   Procedure: PERIPHERAL VASCULAR ATHERECTOMY;  Surgeon: Serafina Mitchell, MD;  Location: [facility];  Service: Cardiovascular;  Laterality: Left;  common femoral and left SFA  . PERIPHERAL VASCULAR INTERVENTION  03/03/2018   Procedure: PERIPHERAL VASCULAR INTERVENTION;  Surgeon: Serafina Mitchell, MD;  Location: [facility];  Service: Cardiovascular;;  left SFA  . SKIN SPLIT GRAFT Right 07/25/2017    Procedure: Repeat Irrigation and Debridement Right Knee, Split Thickness Skin Graft;  Surgeon: [name], MD;  Location: [facility];  Service: Orthopedics;  Laterality: Right;  . SKIN SPLIT GRAFT Right 12/05/2017   Procedure: SKIN GRAFT SPLIT THICKNESS WOUND KNEE, APPLY VAC;  Surgeon: [name], MD;  Location: [facility];  Service: Orthopedics;  Laterality: Right;  . TONSILLECTOMY      Allergies  Allergen Reactions  . Cinnamon Hives  . Ciprofloxacin Other (See Comments)    TREMORS  . Diovan [Valsartan] Other (See Comments)    Extreme vertigo  . Food Diarrhea and Other (See Comments)    ORANGE JUICE   UPSET STOMACH  . Latex Rash and Other (See Comments)    Rash/inflammation due to exposure  . Nitrofuran Derivatives Hives and Rash    "Full body rash"  . Penicillins Hives and Swelling    *tolerated Ceftriaxone September 2018 Has patient had a PCN reaction causing immediate rash, facial/tongue/throat swelling, SOB or lightheadedness with hypotension:No--severe irritation at the injection site Has patient had a PCN reaction causing severe rash involving mucus membranes or skin necrosis:Unknown Has patient had a PCN reaction that required hospitalization:No Has patient had a PCN reaction occurring within the last 10 years:Yes If all of the above answers are "NO", then may proceed with  . Bactrim [Sulfamethoxazole-Trimethoprim] Diarrhea and Nausea Only  . Other Rash    Mycins, Strawberry, Oranges  . Sulfa Antibiotics Diarrhea and Nausea Only    Prior to Admission medications   Medication Sig Start Date End Date Taking? Authorizing Provider  aspirin EC 81 MG tablet Take 81 mg by mouth every evening.    Yes [provider]  Calcium Carbonate-Vitamin D (CALCIUM 600+D) 600-400 MG-UNIT per tablet Take 1 tablet by mouth every evening.    Yes [provider]  cetirizine (ZYRTEC) 10 MG tablet Take 10 mg by mouth at bedtime.    Yes [provider]  diltiazem (TIAZAC)  120 MG 24 hr capsule HOLD until seen by your PCP Patient taking differently: Take 120 mg by mouth daily.  03/18/18  Yes Oretha Milch D, MD  diphenhydrAMINE (BENADRYL) 25 mg capsule Take 25 mg by mouth 3 (three) times daily.    Yes [provider]  famotidine (PEPCID) 20 MG tablet Take 2 tablets (40 mg total) by mouth 2 (two) times daily. Patient taking differently: Take 20 mg by mouth daily.  07/04/18  Yes Vasireddy, Grier Mitts, MD  ferrous sulfate 325 (65 FE) MG tablet Take 487.5 mg by mouth every evening.    Yes [provider]  FIBER PO Take 1 capsule by mouth 2 (two) times daily.   Yes [provider]  gabapentin (NEURONTIN) 100 MG capsule Take 200 mg by mouth 2 (two) times daily.    Yes [provider]  Glucosamine HCl (GLUCOSAMINE PO) Take 1 tablet by mouth daily.   Yes [provider]  ibuprofen (ADVIL,MOTRIN) 200 MG tablet Take 400 mg by mouth daily.   Yes [provider]  levothyroxine (SYNTHROID, LEVOTHROID) 175 MCG tablet Take 175 mcg by mouth daily before breakfast.   Yes [provider]  Magnesium 125 MG CAPS Take 125 mg by mouth every evening.    Yes [provider]  Multiple Vitamins-Minerals (MULTIVITAMIN WITH MINERALS) tablet Take 1 tablet by mouth every evening.    Yes [provider]  vitamin C (ASCORBIC ACID) 500 MG tablet Take 500 mg by mouth every evening.    Yes [provider]    Social History   Socioeconomic History  . Marital status: Married    Spouse name: Not on file  . Number of children: 0  . Years of education: Not on file  . Highest education level: Not on file  Occupational History    Comment: retired Marine scientist.   Social Needs  . Financial resource strain: Not on file  . Food insecurity:    Worry: Not on file    Inability: Not on file  . Transportation needs:    Medical: Not on file    Non-medical: Not on file  Tobacco Use  . Smoking status: Former Smoker     Packs/day: 0.50    Years: 10.00    Pack years: 5.00    Last attempt to quit: 11/19/1979    Years since quitting: 38.7  . Smokeless tobacco: Never Used  Substance and Sexual Activity  . Alcohol use: Yes    Alcohol/week: 14.0 standard drinks    Types: 14 Glasses of wine per week    Comment: 2 glasses of wine with dinner  . Drug use: No  . Sexual activity: Not on file  Lifestyle  . Physical activity:    Days per week: Not on file    Minutes per session: Not on file  . Stress: Not on file  Relationships  . Social connections:    Talks on phone: Not on file    Gets together: Not on file    Attends religious service: Not on file    Active member of club or organization: Not on file    Attends meetings of clubs or organizations: Not on file    Relationship status: Not on file  . Intimate partner violence:    Fear of current or ex partner: Not on file    Emotionally abused: Not on file    Physically abused: Not on file    Forced sexual activity: Not on file  Other Topics Concern  . Not on file  Social History Narrative  . Not on file     Family History  Problem Relation Age of Onset  . Stroke Mother   . Varicose Veins Mother   . Cancer Father        prostate  . Stroke Sister   . Heart disease Sister   . Varicose Veins Sister   . Stroke Maternal Grandmother   . Varicose Veins Sister     ROS: [x]  Positive   [ ]  Negative   [ ]  All sytems reviewed and are negative  Cardiovascular: []  chest pain/pressure []  palpitations []  SOB lying flat []  DOE []  pain in legs while walking [x]  pain in legs at rest (left leg) [x]  pain in legs at night (left leg) [x]  non-healing ulcers (left leg) []  hx of DVT []  swelling in legs  Pulmonary: []  productive cough []  asthma/wheezing []  home O2  Neurologic: []  weakness in []  arms []  legs []  numbness in []  arms []  legs []  hx of CVA []   mini stroke [] difficulty speaking or slurred speech []  temporary loss of vision in one eye []   dizziness  Hematologic: []  hx of cancer []  bleeding problems []  problems with blood clotting easily  Endocrine:   []  diabetes []  thyroid disease  GI []  vomiting blood []  blood in stool  GU: []  CKD/renal failure []  HD--[]  M/W/F or []  T/T/S []  burning with urination []  blood in urine  Psychiatric: []  anxiety []  depression  Musculoskeletal: []  arthritis []  joint pain  Integumentary: []  rashes []  ulcers  Constitutional: []  fever []  chills   Physical Examination  Vitals:   08/14/18 2140 08/15/18 0508  BP: (!) 133/46 (!) 123/47  Pulse: 89 87  Resp: 18   Temp: 99.5 F (37.5 C)   SpO2: 95% 95%   Body mass index is 29.97 kg/m.  General:  Resting in bed HENT: WNL, normocephalic Pulmonary: No respiratory distress Cardiac: regular, without  Murmurs, rubs or gallops Abdomen: soft, NT/ND Skin: Extensive tissue loss to left leg as pictured, dressing in place, patient refused to have it removed even with pain meds Vascular Exam/Pulses:  Right Left  Radial 2+ (normal) 2+ (normal)  Ulnar    Femoral 1+ (weak) 1+ (weak)  Popliteal    DP  Unable to examine - patient would not let me remove her dressing  PT  Unable to examine - patient would not let me remove her dressing   Extremities: with ischemic changes,  Gangrene ,   with open wounds; -- left leg Well healed R AKA Musculoskeletal: no muscle wasting or atrophy  Neurologic: A&O X 3; Appropriate Affect ; Able to wiggle her left toes      CBC    Component Value Date/Time   WBC 8.9 08/15/2018 0403   RBC 2.64 (L) 08/15/2018 0403   HGB 7.0 (L) 08/15/2018 0403   HGB 11.9 04/15/2013 1516   HCT 23.2 (L) 08/15/2018 0403   HCT 38.7 04/15/2013 1516   PLT 345 08/15/2018 0403   PLT 289 04/15/2013 1516   MCV 87.9 08/15/2018 0403   MCV 88.0 12/25/2014 1540   MCV 89.6 04/15/2013 1516   MCH 26.5 08/15/2018 0403   MCHC 30.2 08/15/2018 0403   RDW 15.6 (H) 08/15/2018 0403   RDW 15.1 (H) 04/15/2013 1516   LYMPHSABS  1.4 08/15/2018 0403   LYMPHSABS 2.4 04/15/2013 1516   MONOABS 1.0 08/15/2018 0403   MONOABS 0.6 04/15/2013 1516   EOSABS 0.6 08/15/2018 0403   EOSABS 0.5 04/15/2013 1516   BASOSABS 0.0 08/15/2018 0403   BASOSABS 0.0 04/15/2013 1516    BMET    Component Value Date/Time   NA 139 08/15/2018 0403   NA 133 (A) 12/11/2017   NA 140 04/15/2013 1516   K 4.7 08/15/2018 0403   K 4.3 04/15/2013 1516   CL 112 (H) 08/15/2018 0403   CL 105 04/15/2013 1516   CO2 22 08/15/2018 0403   CO2 25 04/15/2013 1516   GLUCOSE 89 08/15/2018 0403   GLUCOSE 94 04/15/2013 1516   BUN 29 (H) 08/15/2018 0403   BUN 16 12/11/2017   BUN 28.2 (H) 04/15/2013 1516   CREATININE 1.21 (H) 08/15/2018 0403   CREATININE 1.21 (H) 12/25/2014 1530   CREATININE 1.4 (H) 04/15/2013 1516   CALCIUM 8.7 (L) 08/15/2018 0403   CALCIUM 9.9 04/15/2013 1516   GFRNONAA 41 (L) 08/15/2018 0403   GFRNONAA 43 (L) 12/25/2014 1530   GFRAA 48 (L) 08/15/2018 0403   GFRAA 50 (L) 12/25/2014 1530    COAGS:  Lab Results  Component Value Date   INR 1.31 03/10/2018   INR 1.10 12/15/2017   INR 1.07 11/12/2017     Non-Invasive Vascular Imaging:    Patient refused  ASSESSMENT/PLAN: This is a 83 y.o. female with extensive tissue loss to her left lower extremity as pictured above in the setting of venous insufficiency as well as arterial insufficiency.  Ultimately patient refused to let me remove her dressing today even after recommending pain medicine beforehand.  She states the pain in her left foot is too severe and she cannot tolerate her dressings being touched at this time.  Reviewed her previous records as well as the clinical pictures in the chart, I recommended the best course of action moving forward is probably a left above-knee amputation given the extent of tissue loss and the fact she is non-ambulatory.  Patient does not walk and now no longer pivots with her left leg with home nursing care.  Dr. Trula Slade performed a left common  femoral as well as SFA atherectomy in April with no real improvement in healing.  Patient has refused any repeat ABIs (although only had monophasic flow at ankles in July). Patient is very adamant that she does not want any surgical intervention.  States that she has talked to palliative care and would like to move forward with hospice.  This seems reasonable.  Marty Heck, MD [facility] Office: [phone] Pager: [place]

## 2018-08-15 NOTE — Progress Notes (Signed)
Daily Progress Note   Patient Name: Shelby Reese       Date: 08/15/2018 DOB: 1935/06/15  Age: 83 y.o. MRN#: [id number] Attending Physician: Kerney Elbe, DO Primary Care Physician: [name], MD Admit Date: 08/13/2018  Reason for Consultation/Follow-up: Establishing goals of care and Pain control  Subjective: I met today with Ms. Kiesel.  Her home health aide was also present.    She reports that she continues to have uncontrolled pain and is clear that her main goal at this point is effective pain management.  She is adamant that she is not going to have further procedures and requests to discharge home with hospice.    We discussed options to work to medically maximize (blood transfusion, cont abx while in hospital) vs forgoing further hospital interventions and working to get home with hospice with plan for no further hospital interventions.  Length of Stay: 2  Current Medications: Scheduled Meds:  . sodium chloride   Intravenous Once  . aspirin EC  81 mg Oral QPM  . calcium-vitamin D  1 tablet Oral Q breakfast  . diltiazem  120 mg Oral Daily  . diphenhydrAMINE  25 mg Oral TID  . famotidine  20 mg Oral Daily  . ferrous sulfate  650 mg Oral QPM  . gabapentin  200 mg Oral BID  . heparin  5,000 Units Subcutaneous Q8H  . levothyroxine  175 mcg Oral QAC breakfast  . loratadine  10 mg Oral Daily  . magnesium oxide  200 mg Oral QPM  . multivitamin with minerals  1 tablet Oral QPM  . vitamin C  500 mg Oral QPM    Continuous Infusions: . sodium chloride 100 mL/hr (08/14/18 1706)  . ceFEPime (MAXIPIME) IV 2 g (08/15/18 0935)  . vancomycin      PRN Meds: acetaminophen, fentaNYL (SUBLIMAZE) injection, ondansetron **OR** ondansetron (ZOFRAN) IV, oxyCODONE  Physical Exam          Constitutional: She is oriented to person, place, and time. She appears mildly  distressed.  Frail elderly female  HENT:  Head: Normocephalic and atraumatic.  Neck: Normal range of motion.  Cardiovascular: Normal rate.  Pulmonary/Chest: Effort normal.  Musculoskeletal: Normal range of motion.  Right AKA Left lower extremity erythema.  Wounds not examined  Neurological: She is alert and oriented to  person, place, and time.  Skin: Skin is warm and dry. There is pallor.  Psychiatric: She has a normal mood and affect. Her behavior is normal. Judgment and thought content normal.  Nursing note and vitals reviewed  Vital Signs: BP (!) 123/47   Pulse 87   Temp 99.5 F (37.5 C) (Oral)   Resp 18   Ht _0  (1.651 m)   Wt 81.7 kg   SpO2 95%   BMI 29.97 kg/m  SpO2: SpO2: 95 % O2 Device: O2 Device: Room Air O2 Flow Rate: O2 Flow Rate (L/min): 2 L/min  Intake/output summary:   Intake/Output Summary (Last 24 hours) at 08/15/2018 1121 Last data filed at 08/15/2018 0600 Gross per 24 hour  Intake 4187.74 ml  Output 900 ml  Net 3287.74 ml   LBM: Last BM Date: 08/12/18 Baseline Weight: Weight: 81.7 kg Most recent weight: Weight: 81.7 kg       Palliative Assessment/Data:      Patient Active Problem List   Diagnosis Date Noted  . Gangrene of right foot ([place]) 08/13/2018  . Goals of care, counseling/discussion   . Palliative care by specialist   . Chronic pain of left lower extremity   . Gangrene of left lower extremity due to atherosclerosis ([place])   . Moderate protein-calorie malnutrition ([place])   . Cellulitis of left lower extremity   . Symptomatic anemia 03/09/2018  . Fecal occult blood test positive   . Arterioloscleroses 03/02/2018  . Anemia 02/23/2018  . Foot ulcer ([place]) 02/23/2018  . Depression due to physical illness 02/18/2018  . Chronic ulcer of right heel with necrosis of bone ([place]) 12/27/2017  . Peripheral vascular disease of lower extremity with ulceration  ([place]) 12/27/2017  . Acute renal failure superimposed on stage 3 chronic kidney disease ([place]) 12/27/2017  . GERD (gastroesophageal reflux disease) 12/27/2017  . CKD (chronic kidney disease), stage III ([place]) 08/29/2017  . Hypertension 08/29/2017  . Peripheral vascular disease ([place]) 08/29/2017  . Skin ulcer of knee, right, with fat layer exposed ([place]) 08/29/2017  . Pressure injury of skin 07/25/2017  . Wound, open, knee, lower leg, or ankle with complication, right, initial encounter   . Idiopathic chronic venous hypertension of both lower extremities with ulcer and inflammation ([place]) 07/15/2017  . Skin ulcer of right knee with necrosis of muscle ([place]) 07/15/2017  . Atherosclerosis of artery of right lower extremity ([place]) 07/11/2017  . Essential hypertension 07/11/2017  . Osteoporosis 07/11/2017  . Cataracts, bilateral 07/11/2017  . Hypothyroidism, acquired 12/27/2014  . Iron deficiency anemia 12/27/2014  . Hiatal hernia 12/27/2014  . Varicose veins of lower extremities with other complications 51/12/5850    Palliative Care Assessment & Plan   Patient Profile: 83 y.o. female  with past medical history of hypertension, hypothyroidism, chronic kidney disease stage III, history of hemorrhagic shock with unknown source (April 2019), severe peripheral arterial disease status post right AKA January 2019 admitted on 08/13/2018 with recurrent wound infection and pain.   Assessment: Patient Active Problem List   Diagnosis Date Noted  . Gangrene of right foot ([place]) 08/13/2018  . Goals of care, counseling/discussion   . Palliative care by specialist   . Chronic pain of left lower extremity   . Gangrene of left lower extremity due to atherosclerosis ([place])   . Moderate protein-calorie malnutrition ([place])   . Cellulitis of left lower extremity   . Symptomatic anemia 03/09/2018  . Fecal occult blood test positive   . Arterioloscleroses 03/02/2018  . Anemia 02/23/2018  .  Foot ulcer ([place])  02/23/2018  . Depression due to physical illness 02/18/2018  . Chronic ulcer of right heel with necrosis of bone ([place]) 12/27/2017  . Peripheral vascular disease of lower extremity with ulceration ([place]) 12/27/2017  . Acute renal failure superimposed on stage 3 chronic kidney disease ([place]) 12/27/2017  . GERD (gastroesophageal reflux disease) 12/27/2017  . CKD (chronic kidney disease), stage III ([place]) 08/29/2017  . Hypertension 08/29/2017  . Peripheral vascular disease ([place]) 08/29/2017  . Skin ulcer of knee, right, with fat layer exposed ([place]) 08/29/2017  . Pressure injury of skin 07/25/2017  . Wound, open, knee, lower leg, or ankle with complication, right, initial encounter   . Idiopathic chronic venous hypertension of both lower extremities with ulcer and inflammation ([place]) 07/15/2017  . Skin ulcer of right knee with necrosis of muscle ([place]) 07/15/2017  . Atherosclerosis of artery of right lower extremity ([place]) 07/11/2017  . Essential hypertension 07/11/2017  . Osteoporosis 07/11/2017  . Cataracts, bilateral 07/11/2017  . Hypothyroidism, acquired 12/27/2014  . Iron deficiency anemia 12/27/2014  . Hiatal hernia 12/27/2014  . Varicose veins of lower extremities with other complications 24/82/5003   Recommendations/Plan:  DNR/DNI  She would like to work to medically maximize today (transfusion, cont abx while inpatient, determine if oral abx would be of benefit on discharge) but would like to transition home with hospice support as soon as tomorrow.  Consult placed to care management to facilitate.  Pain: Pain control is her top priority.  Her pain medications should not be held due to concern for side effects such as hypotension or respiratory rate if they are needed for her comfort.   Poor control today per her report and nursing evaluations.  Will plan to liberalize pain medication with scheduled oxycodone 14m every 6 hours and breakthrough oxycodone 5-169mevery 2 hours as needed.  I  also left IV fentanyl to be used for breakthrough if oral medication is ineffective.  Goals of Care and Additional Recommendatio ns:  Goal to transition home with hospice support in next 24-48 hours  Code Status:    Code Status Orders  (From admission, onward)         Start     Ordered   08/14/18 1750  Do not attempt resuscitation (DNR)  Continuous    Question Answer Comment  In the event of cardiac or respiratory ARREST Do not call a "code blue"   In the event of cardiac or respiratory ARREST Do not perform Intubation, CPR, defibrillation or ACLS   In the event of cardiac or respiratory ARREST Use medication by any route, position, wound care, and other measures to relive pain and suffering. May use oxygen, suction and manual treatment of airway obstruction as needed for comfort.      08/14/18 1749        Code Status History    Date Active Date Inactive Code Status Order ID Comments User Context   08/13/2018 2157 08/14/2018 1749 Full Code [id number] ReachMD Inpatient   06/29/2018 2333 07/04/2018 2104 DNR [id number], KaRhetta MuraNP Inpatient   06/29/2018 2144 06/29/2018 2333 Full Code [id number] BullsMD ED   02/23/2018 2113 03/18/2018 1832 Full Code [id number] BorderMD Inpatient   12/12/2017 2301 12/23/2017 1926 Full Code [id number] HaneMD Inpatient   11/28/2017 1307 12/08/2017 1808 Full Code [id number] MinionMD Inpatient   08/29/2017 1810 09/04/2017 1946 Full Code [id number]  Samella Parr, NP Inpatient   07/18/2017 0341 07/29/2017 2238 Full Code [id number]  Edwin Dada, MD Inpatient    Advance Directive Documentation     Most Recent Value  Type of Advance Directive  Healthcare Power of Attorney, Living will  Pre-existing out of facility DNR order (yellow form or pink MOST form)  -  "MOST" Form in Place?  -       Prognosis:   < 6 months  Discharge Planning:  Home with Hospice  Care plan was discussed  with patient, RN  Thank you for allowing the Palliative Medicine Team to assist in the care of this patient.   Time In: 1020 Time Out: 1100 Total Time 40 Prolonged Time Billed No      Greater than 50%  of this time was spent counseling and coordinating care related to the above assessment and plan.  Micheline Rough, MD  Please contact Palliative Medicine Team phone at [phone] for questions and concerns.

## 2018-08-15 NOTE — Progress Notes (Signed)
PROGRESS NOTE    Shelby Reese  OMV:[id number] DOB: 06-22-1935 DOA: 08/13/2018 PCP: [name], MD  Brief Narrative:  HPI per Dr. Gala Romney on 08/13/18 Shelby Reese is a 83 y.o. female with medical history significant of peripheral vascular disease status post right AKA, left lower extremity venous ulcer which is infected being followed at the wound clinic, hypertension, hypothyroidism, morbid obesity and chronic kidney disease stage III who was sent over from the wound care center due to worsening pain and infected left lower extremity venous ulcer.  Patient is complaining of 9 out of 10 pain.  She was seen by Dr. Quentin Cornwall in the wound care center.  She knows she may require below-knee amputation.  She is having significant fever in addition.  Also swollen leg with chronic lymphedema.  Patient's also appeared infected.  She is being admitted for treatment.  Patient has gangrenous area in the anterior ankle wound.  ** Patient has having significant pain so Palliative adjusting regimen.  Upon further review the case, Dr. Dellia Nims is not a orthopedic surgeon and he is in outpatient wound care specialist.  Vascular surgery was consulted for further evaluation recommendations and discussed the case with Dr. Monica Martinez who came to see the patient in consultation.  Patient is adamantly refusing surgery and is electing for hospice.  Palliative care involved for goals of discussion and patient has been changed to DNR and is likely to be discharged home with home hospice tomorrow and is being stabilized today.  Hemoglobin/hematocrit dropped and will be transfused 2 units of PRBCs  Assessment & Plan:   Principal Problem:   Gangrene of left lower extremity due to atherosclerosis [facility]) Active Problems:   Hypothyroidism, acquired   Essential hypertension   Peripheral vascular disease (HCC)   GERD (gastroesophageal reflux disease)   Cellulitis of left lower extremity   Gangrene of right foot  (HCC)  Pain and Gangrene of the Left Lower Extremity -Secondary to vascular ulcer.  Infected,.   -Admitted patient and start on IV antibiotics.   -Dr. Dellia Nims is an outpatient Wound Specialist and does not follow patients in the hospital; -Most likely she might require amputation so Vascular Surgery was consulted for further evaluation and recommendations and patient adamantly refusing surgery and electing for Hospice  -Leslie Nurse for further evaluation and recommendations but did not evaluate the patient and per nurse "out of scope of practice" based on documentation -Patient was given Robaxin 5 mg p.o. once, tramadol 50 mg p.o. once, as well as IV morphine yesterday twice -Pain control with acetaminophen 650 mg p.o. every 6 PRN for mild pain, oxycodone 5-10 mg p.o. every 2 PRN for moderate pain, Oxycodone IR 10 mg q6h, as well as fentanyl 25 mcg IV every 1 as needed for severe pain; Palliative Adjusting Pain Regimen -CRP was 13.3 and Sed Rate was 83 -WBC was 11.6 on admission and improved to 9.5 -Lactic acid level was normal -Continue with antibiotics as below -Orthopedics Consulted and awaiting Evaluation -Palliative Care consulted for further evaluation and recommendations and Patient is now DNR/DNI; the goals of care discussion with palliative care medicine Dr. Domingo Cocking the patient would like to work to medically maximize her current status however would ultimately transition home with home hospice support likely tomorrow -Patient's priority is pain control and Dr. Domingo Cocking is currently adjusting her pain regimen -Appreciate palliative care medicine input  Severe Peripheral Vascular Disease with history of right AKA -Advanced and well-known.   -Asked for Vascular Surgery  input and they ultimately offered her an AKA but patient is adamant that she does not want any surgical intervention and would like to move forward with hospice which seems appropriate -Vascular Studies have been done in the  past and attempted to repeat an ABI today but patient refused -Continue with aspirin 81 mg p.o. daily for now  Cellulitis of the Left Lower Extremity -Continue IV antibiotics as indicated. -Obtain an aerobic and anaerobic wound culture from the left leg wound -Patient was started on IV cefepime and will add IV vancomycin because it is purulent and necrotic and possibly de-escalate antibiotics and change to p.o. in the a.m. if it would benefit the patient -Vascular surgery consulted and recommending a left AKA given the extent of tissue loss and the fact that she is non-ambulatory  GERD -Continue with home famotidine 20 mg p.o. daily  Essential Hypertension -Blood pressure elevated in the setting of pain and infection.  Resume home medication with diltiazem 120 mg p.o. Daily -Blood pressure this afternoon was 130/58  Acute Kidney Injury on CKD stage II -Continued hydration with normal saline at 125 mL's per hour but will decrease rate to 100 mL/hr yesterday and will decrease rate to 75 mL's per hour today -Avoid nephrotoxic medications if possible and will stop ibuprofen 400 mg p.o. Daily -BUN/creatinine is stable at 29/1.21 -Continue monitor and trend renal function -Repeat CMP in the a.m.  Hypothyroidism -Check TSH in AM -Continue with Levothyroxine 175 mcg p.o. daily before breakfast  Seasonal Allergies -Continue with Loratadine 10 mg daily along with diphenhydramine 25 mg p.o. 3 times daily  Normocytic Anemia -Likely in the setting of CKD -Patient's hemoglobin/hematocrit was 8.8/20.2 on admission is now 7.0/3.2 -Type and screen and transfuse 2 units PRBCs -Continue with ferrous sulfate 650 mill grams p.o. nightly -Anemia panel not done this a.m. -Continue to monitor for signs and symptoms of bleeding -Repeat CBC in a.m.  DVT prophylaxis: Heparin 5000 units subcu every 8 hours Code Status: FULL CODE Family Communication: No family present at bedside but Care-Giver at  bedside  Disposition Plan: Anticipate discharge home with home hospice in the next 24 to 48 hours  Consultants:   Orthopedic Surgery  Palliative Care Medicine    Procedures: None   Antimicrobials:  Anti-infectives (From admission, onward)   Start     Dose/Rate Route Frequency Ordered Stop   08/15/18 1800  vancomycin (VANCOCIN) 1,250 mg in sodium chloride 0.9 % 250 mL IVPB     1,250 mg 166.7 mL/hr over 90 Minutes Intravenous Every 48 hours 08/14/18 1133     08/14/18 2200  ceFEPIme (MAXIPIME) 2 g in sodium chloride 0.9 % 100 mL IVPB     2 g 200 mL/hr over 30 Minutes Intravenous Every 12 hours 08/14/18 1128     08/14/18 0600  ceFEPIme (MAXIPIME) 1 g in sodium chloride 0.9 % 100 mL IVPB  Status:  Discontinued     1 g 200 mL/hr over 30 Minutes Intravenous Every 12 hours 08/13/18 1909 08/14/18 1128   08/13/18 1900  ceFEPIme (MAXIPIME) 2 g in sodium chloride 0.9 % 100 mL IVPB  Status:  Discontinued     2 g 200 mL/hr over 30 Minutes Intravenous  Once 08/13/18 1848 08/13/18 1903   08/13/18 1700  vancomycin (VANCOCIN) 2,000 mg in sodium chloride 0.9 % 500 mL IVPB     2,000 mg 250 mL/hr over 120 Minutes Intravenous  Once 08/13/18 1646 08/13/18 2124   08/13/18 1700  ceFEPIme (MAXIPIME) 2  g in sodium chloride 0.9 % 100 mL IVPB     2 g 200 mL/hr over 30 Minutes Intravenous  Once 08/13/18 1646 08/13/18 1827     Subjective: Seen and examined at bedside and was eating her yogurt today and severely complained of left lower extremity pain and states it was intolerable.  However she was not asking for her pain medications and was upset.  Adamantly refusing surgical intervention and wants to go home with hospice.  Palliative care involved for goals of discussion and anticipate discharge home with home hospice in the next 24 to 48 hours.  Patient is going to be medically optimized.  Patient states that she is a "wreck."  Left lower extremity pain was her biggest complaint this morning but denies any  chest pain, nausea, vomiting, lightheadedness or dizziness.  Objective: Vitals:   08/14/18 2140 08/15/18 0508 08/15/18 1251 08/15/18 1310  BP: (!) 133/46 (!) 123/47 (!) 131/53 (!) 130/58  Pulse: 89 87 73 77  Resp: 18     Temp: 99.5 F (37.5 C)  98.5 F (36.9 C) 99.3 F (37.4 C)  TempSrc: Oral  Oral Oral  SpO2: 95% 95% 96% 97%  Weight:      Height:        Intake/Output Summary (Last 24 hours) at 08/15/2018 1426 Last data filed at 08/15/2018 1252 Gross per 24 hour  Intake 3977.74 ml  Output 800 ml  Net 3177.74 ml   Filed Weights   08/13/18 1541  Weight: 81.7 kg   Examination: Physical Exam:  Constitutional: Nourished, well-developed overweight elderly Caucasian female who is very anxious and tearful and appears uncomfortable secondary to left lower extremity pain Eyes: Lids and conjunctive are normal.  Sclera anicteric ENMT: External ears and nose appear normal.  Grossly normal hearing.  Mucous members are moist Neck: Appears supple no JVD Respiratory: Diminished to auscultation bilaterally no appreciable wheezing, rales, rhonchi.  Patient was not tachypneic using accessory muscles to breathe Cardiovascular: Regular rate and rhythm.  No appreciable murmurs, rubs, gallops. Abdomen: Soft, nontender, slightly distended second body habitus.  Bowel sounds present all 4 quadrants GU: Deferred Musculoskeletal: Has a right AKA Skin: Patient's left leg was wrapped and would not let me onto her dressing today to review her lower dorsal or necrosis Neurologic: Cranial nerves II through XII grossly intact no appreciable focal deficits. Psychiatric: Anxious and agitated mood and affect.  Intact judgment and insight.  Patient again was tearful secondary to pain  Data Reviewed: I have personally reviewed following labs and imaging studies  CBC: Recent Labs  Lab 08/13/18 1725 08/14/18 0455 08/15/18 0403  WBC 11.6* 9.5 8.9  NEUTROABS 8.7*  --  5.9  HGB 8.8* 7.8* 7.0*  HCT 28.2*  25.1* 23.2*  MCV 86.2 86.9 87.9  PLT 421* 379 732   Basic Metabolic Panel: Recent Labs  Lab 08/13/18 1725 08/14/18 0455 08/15/18 0403  NA 136 138 139  K 4.5 5.0 4.7  CL 103 107 112*  CO2 23 22 22   GLUCOSE 88 96 89  BUN 33* 33* 29*  CREATININE 1.20* 1.28* 1.21*  CALCIUM 9.6 9.1 8.7*  MG  --   --  1.9  PHOS  --   --  3.4   GFR: Estimated Creatinine Clearance: 39.2 mL/min (A) (by C-G formula based on SCr of 1.21 mg/dL (H)). Liver Function Tests: Recent Labs  Lab 08/14/18 0455 08/15/18 0403  AST 9* 10*  ALT 8 8  ALKPHOS 48 42  BILITOT 0.5  0.5  PROT 6.3* 6.0*  ALBUMIN 2.9* 2.6*   No results for input(s): LIPASE, AMYLASE in the last 168 hours. No results for input(s): AMMONIA in the last 168 hours. Coagulation Profile: No results for input(s): INR, PROTIME in the last 168 hours. Cardiac Enzymes: No results for input(s): CKTOTAL, CKMB, CKMBINDEX, TROPONINI in the last 168 hours. BNP (last 3 results) No results for input(s): PROBNP in the last 8760 hours. HbA1C: No results for input(s): HGBA1C in the last 72 hours. CBG: No results for input(s): GLUCAP in the last 168 hours. Lipid Profile: No results for input(s): CHOL, HDL, LDLCALC, TRIG, CHOLHDL, LDLDIRECT in the last 72 hours. Thyroid Function Tests: No results for input(s): TSH, T4TOTAL, FREET4, T3FREE, THYROIDAB in the last 72 hours. Anemia Panel: No results for input(s): VITAMINB12, FOLATE, FERRITIN, TIBC, IRON, RETICCTPCT in the last 72 hours. Sepsis Labs: Recent Labs  Lab 08/13/18 1733  LATICACIDVEN 0.68    Recent Results (from the past 240 hour(s))  Blood culture (routine x 2)     Status: None (Preliminary result)   Collection Time: 08/13/18  5:41 PM  Result Value Ref Range Status   Specimen Description   Final    BLOOD RIGHT WRIST Performed at [facility], [place] [address], [place], [place] [address]    Special Requests   Final    BOTTLES DRAWN AEROBIC AND ANAEROBIC Blood Culture  adequate volume Performed at [facility] [address] [place], [place] [address]    Culture   Final    NO GROWTH 2 DAYS Performed at [facility] [address], [place], [place] [address]    Report Status PENDING  Incomplete  Blood culture (routine x 2)     Status: None (Preliminary result)   Collection Time: 08/13/18  5:41 PM  Result Value Ref Range Status   Specimen Description   Final    BLOOD LEFT ARM Performed at [facility] [address], [place], [place] [address]    Special Requests   Final    BOTTLES DRAWN AEROBIC AND ANAEROBIC Blood Culture results may not be optimal due to an inadequate volume of blood received in culture bottles Performed at [facility] [address], [place], [place] [address]    Culture   Final    NO GROWTH 2 DAYS Performed at [facility] [address], [place], [place] [address]    Report Status PENDING  Incomplete  Aerobic/Anaerobic Culture (surgical/deep wound)     Status: None (Preliminary result)   Collection Time: 08/14/18  9:19 AM  Result Value Ref Range Status   Specimen Description   Final    WOUND LEFT LEG Performed at [facility] [address] [place], [place] [address]    Special Requests   Final    Normal Performed at [facility], [place] [address] [place], [place] [address]    Gram Stain   Final    FEW WBC PRESENT, PREDOMINANTLY PMN RARE GRAM POSITIVE COCCI    Culture   Final    CULTURE REINCUBATED FOR BETTER GROWTH Performed at [facility], [place] [address] [place], [place] [address]    Report Status PENDING  Incomplete  MRSA PCR Screening     Status: None   Collection Time: 08/14/18 10:15 AM  Result Value Ref Range Status   MRSA by PCR NEGATIVE NEGATIVE Final    Comment:  The GeneXpert MRSA Assay (FDA approved for NASAL specimens only), is one component of a comprehensive MRSA  colonization surveillance program. It is not intended to diagnose MRSA infection nor to guide or monitor treatment for MRSA infections. Performed at [facility], [place] [address] [place], [place] [address]     Radiology Studies: Dg Tibia/fibula Left  Result Date: 08/13/2018 CLINICAL DATA:  Left leg wound EXAM: LEFT TIBIA AND FIBULA - 2 VIEW COMPARISON:  None. FINDINGS: No acute bony abnormality. Specifically, no fracture, subluxation, or dislocation. No radiographic changes of osteomyelitis. Degenerative changes in the left knee and ankle. IMPRESSION: No acute bony abnormality. Electronically Signed   By: Rolm Baptise M.D.   On: 08/13/2018 18:16   Dg Foot Complete Left  Result Date: 08/13/2018 CLINICAL DATA:  Left foot and leg wound. EXAM: LEFT FOOT - COMPLETE 3+ VIEW COMPARISON:  06/29/2018 FINDINGS: Previously seen bone irregularity and lucency at the base of the left 5th metatarsal appears improved. Diffuse osteopenia. Decreasing soft tissue swelling within the foot. No visible radiographic changes of osteomyelitis currently. IMPRESSION: Improved appearance at the base of the left 5th metatarsal with decreasing lucency/irregularity. Osteopenia. Electronically Signed   By: Rolm Baptise M.D.   On: 08/13/2018 18:15   Scheduled Meds: . aspirin EC  81 mg Oral QPM  . calcium-vitamin D  1 tablet Oral Q breakfast  . diltiazem  120 mg Oral Daily  . diphenhydrAMINE  25 mg Oral TID  . famotidine  20 mg Oral Daily  . ferrous sulfate  650 mg Oral QPM  . gabapentin  200 mg Oral BID  . heparin  5,000 Units Subcutaneous Q8H  . levothyroxine  175 mcg Oral QAC breakfast  . loratadine  10 mg Oral Daily  . magnesium oxide  200 mg Oral QPM  . multivitamin with minerals  1 tablet Oral QPM  . oxyCODONE  10 mg Oral Q6H  . vitamin C  500 mg Oral QPM   Continuous Infusions: . sodium chloride 100 mL/hr (08/14/18 1706)  . ceFEPime (MAXIPIME) IV 2 g (08/15/18 0935)  . vancomycin       LOS: 2 days   Kerney Elbe, DO [facility] PAGER is on [place]  If 7PM-7AM, please contact night-coverage [email or web address] Password [email or web address] 08/15/2018, 2:26 PM

## 2018-08-16 LAB — CBC WITH DIFFERENTIAL/PLATELET
BASOS PCT: 1 %
Basophils Absolute: 0 10*3/uL (ref 0.0–0.1)
Eosinophils Absolute: 0.8 10*3/uL — ABNORMAL HIGH (ref 0.0–0.7)
Eosinophils Relative: 10 %
HEMATOCRIT: 31 % — AB (ref 36.0–46.0)
Hemoglobin: 9.6 g/dL — ABNORMAL LOW (ref 12.0–15.0)
Lymphocytes Relative: 18 %
Lymphs Abs: 1.5 10*3/uL (ref 0.7–4.0)
MCH: 27.1 pg (ref 26.0–34.0)
MCHC: 31 g/dL (ref 30.0–36.0)
MCV: 87.6 fL (ref 78.0–100.0)
MONO ABS: 1.1 10*3/uL — AB (ref 0.1–1.0)
MONOS PCT: 12 %
Neutro Abs: 5.3 10*3/uL (ref 1.7–7.7)
Neutrophils Relative %: 59 %
Platelets: 327 10*3/uL (ref 150–400)
RBC: 3.54 MIL/uL — ABNORMAL LOW (ref 3.87–5.11)
RDW: 15.2 % (ref 11.5–15.5)
WBC: 8.8 10*3/uL (ref 4.0–10.5)

## 2018-08-16 LAB — MAGNESIUM: Magnesium: 1.8 mg/dL (ref 1.7–2.4)

## 2018-08-16 LAB — COMPREHENSIVE METABOLIC PANEL
ALBUMIN: 2.6 g/dL — AB (ref 3.5–5.0)
ALT: 8 U/L (ref 0–44)
AST: 12 U/L — ABNORMAL LOW (ref 15–41)
Alkaline Phosphatase: 44 U/L (ref 38–126)
Anion gap: 7 (ref 5–15)
BUN: 25 mg/dL — ABNORMAL HIGH (ref 8–23)
CHLORIDE: 111 mmol/L (ref 98–111)
CO2: 21 mmol/L — AB (ref 22–32)
Calcium: 9 mg/dL (ref 8.9–10.3)
Creatinine, Ser: 1.2 mg/dL — ABNORMAL HIGH (ref 0.44–1.00)
GFR calc non Af Amer: 42 mL/min — ABNORMAL LOW (ref >60)
GFR, EST AFRICAN AMERICAN: 48 mL/min — AB (ref >60)
Glucose, Bld: 84 mg/dL (ref 70–99)
Potassium: 4.5 mmol/L (ref 3.5–5.1)
Sodium: 139 mmol/L (ref 135–145)
Total Bilirubin: 0.5 mg/dL (ref 0.3–1.2)
Total Protein: 6.4 g/dL — ABNORMAL LOW (ref 6.5–8.1)

## 2018-08-16 LAB — PHOSPHORUS: PHOSPHORUS: 3.2 mg/dL (ref 2.5–4.6)

## 2018-08-16 LAB — TSH: TSH: 2.618 u[IU]/mL (ref 0.350–4.500)

## 2018-08-16 MED ORDER — ONDANSETRON HCL 4 MG PO TABS: 4.0000 mg | ORAL_TABLET | Freq: Four times a day (QID) | ORAL | 0 refills | Status: AC | PRN

## 2018-08-16 MED ORDER — ACETAMINOPHEN 325 MG PO TABS: 650.0000 mg | ORAL_TABLET | Freq: Four times a day (QID) | ORAL | 0 refills | Status: AC | PRN

## 2018-08-16 MED ORDER — OXYCODONE HCL 10 MG PO TABS: 10.0000 mg | ORAL_TABLET | ORAL | 0 refills | Status: AC | PRN

## 2018-08-16 NOTE — Progress Notes (Signed)
Daily Progress Note   Patient Name: Shelby Reese       Date: 08/16/2018 DOB: [birth year]  Age: 83 y.o. MRN#: [id number] Attending Physician: Kerney Elbe, DO Primary Care Physician: [name], MD Admit Date: 08/13/2018  Reason for Consultation/Follow-up: Establishing goals of care and Pain control  Subjective: I met today with Ms. Bigos.  Her home health aide was also present.    She reports that she is having more effective pain management with oxycodone 88m tablets.  She is adamant that she is not going to have further procedures and requests to discharge home with hospice.    Length of Stay: 3  Current Medications: Scheduled Meds:  . aspirin EC  81 mg Oral QPM  . calcium-vitamin D  1 tablet Oral Q breakfast  . diltiazem  120 mg Oral Daily  . diphenhydrAMINE  25 mg Oral TID  . famotidine  20 mg Oral Daily  . ferrous sulfate  650 mg Oral QPM  . gabapentin  200 mg Oral BID  . heparin  5,000 Units Subcutaneous Q8H  . levothyroxine  175 mcg Oral QAC breakfast  . loratadine  10 mg Oral Daily  . magnesium oxide  200 mg Oral QPM  . multivitamin with minerals  1 tablet Oral QPM  . oxyCODONE  10 mg Oral Q6H  . vitamin C  500 mg Oral QPM    Continuous Infusions: . sodium chloride 75 mL/hr at 08/16/18 1401    PRN Meds: acetaminophen, fentaNYL (SUBLIMAZE) injection, ondansetron **OR** ondansetron (ZOFRAN) IV, oxyCODONE  Physical Exam         Constitutional: She is oriented to person, place, and time. She appears mildly  distressed.  Frail elderly female  HENT:  Head: Normocephalic and atraumatic.  Neck: Normal range of motion.  Cardiovascular: Normal rate.  Pulmonary/Chest: Effort normal.  Musculoskeletal: Normal range of motion.  Right AKA Left lower extremity  erythema.  Wounds not examined  Neurological: She is alert and oriented to person, place, and time.  Skin: Skin is warm and dry. There is pallor.  Psychiatric: She has a normal mood and affect. Her behavior is normal. Judgment and thought content normal.  Nursing note and vitals reviewed  Vital Signs: BP (!) 152/58   Pulse 78   Temp 99.1 F (37.3 C) (Oral)  Resp 14   Ht _0  (1.651 m)   Wt 81.7 kg   SpO2 94%   BMI 29.97 kg/m  SpO2: SpO2: 94 % O2 Device: O2 Device: Room Air O2 Flow Rate: O2 Flow Rate (L/min): 2 L/min  Intake/output summary:   Intake/Output Summary (Last 24 hours) at 08/16/2018 1759 Last data filed at 08/16/2018 1703 Gross per 24 hour  Intake 3505.49 ml  Output 1150 ml  Net 2355.49 ml   LBM: Last BM Date: 08/12/18 Baseline Weight: Weight: 81.7 kg Most recent weight: Weight: 81.7 kg       Palliative Assessment/Data:      Patient Active Problem List   Diagnosis Date Noted  . Gangrene of right foot ([place]) 08/13/2018  . Goals of care, counseling/discussion   . Palliative care by specialist   . Chronic pain of left lower extremity   . 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 Moderate protein-calorie malnutrition ([place])   . Cellulitis of left lower extremity   . Symptomatic anemia 03/09/2018  . Fecal occult blood test positive   . Arterioloscleroses 03/02/2018  . Anemia 02/23/2018  . Foot ulcer ([place]) 02/23/2018  . Depression due to physical illness 02/18/2018  . Chronic ulcer of right heel with necrosis of bone ([place]) 12/27/2017  . Peripheral vascular disease of lower extremity with ulceration ([place]) 12/27/2017  . Acute renal failure superimposed on stage 3 chronic kidney disease ([place]) 12/27/2017  . GERD (gastroesophageal reflux disease) 12/27/2017  . CKD (chronic kidney disease), stage III ([place]) 08/29/2017  . Hypertension 08/29/2017  . Peripheral vascular disease ([place]) 08/29/2017  . Skin ulcer of knee, right, with fat layer  exposed ([place]) 08/29/2017  . Pressure injury of skin 07/25/2017  . Wound, open, knee, lower leg, or ankle with complication, right, initial encounter   . Idiopathic chronic venous hypertension of both lower extremities with ulcer and inflammation ([place]) 07/15/2017  . Skin ulcer of right knee with necrosis of muscle ([place]) 07/15/2017  . Atherosclerosis of artery of right lower extremity ([place]) 07/11/2017  . Essential hypertension 07/11/2017  . Osteoporosis 07/11/2017  . Cataracts, bilateral 07/11/2017  . Hypothyroidism, acquired 12/27/2014  . Iron deficiency anemia 12/27/2014  . Hiatal hernia 12/27/2014  . Varicose veins of lower extremities with other complications 94/05/6807    Palliative Care Assessment & Plan   Patient Profile: 83 y.o. female  with past medical history of hypertension, hypothyroidism, chronic kidney disease stage III, history of hemorrhagic shock with unknown source (April 2019), severe peripheral arterial disease status post right AKA January 2019 admitted on 08/13/2018 with recurrent wound infection and pain.   Assessment: Patient Active Problem List   Diagnosis Date Noted  . Gangrene of right foot ([place]) 08/13/2018  . Goals of care, counseling/discussion   . Palliative care by specialist   . Chronic pain of left lower extremity   . Gangrene of left lower extremity due to atherosclerosis ([place])   . Moderate protein-calorie malnutrition ([place])   . Cellulitis of left lower extremity   . Symptomatic anemia 03/09/2018  . Fecal occult blood test positive   . Arterioloscleroses 03/02/2018  . Anemia 02/23/2018  . Foot ulcer ([place]) 02/23/2018  . Depression due to physical illness 02/18/2018  . Chronic ulcer of right heel with necrosis of bone ([place]) 12/27/2017  . Peripheral vascular disease of lower extremity with ulceration ([place]) 12/27/2017  . Acute renal failure superimposed on stage 3 chronic kidney disease ([place]) 12/27/2017  . GERD (gastroesophageal reflux  disease)  12/27/2017  . CKD (chronic kidney disease), stage III ([place]) 08/29/2017  . Hypertension 08/29/2017  . Peripheral vascular disease ([place]) 08/29/2017  . Skin ulcer of knee, right, with fat layer exposed ([place]) 08/29/2017  . Pressure injury of skin 07/25/2017  . Wound, open, knee, lower leg, or ankle with complication, right, initial encounter   . Idiopathic chronic venous hypertension of both lower extremities with ulcer and inflammation ([place]) 07/15/2017  . Skin ulcer of right knee with necrosis of muscle ([place]) 07/15/2017  . Atherosclerosis of artery of right lower extremity ([place]) 07/11/2017  . Essential hypertension 07/11/2017  . Osteoporosis 07/11/2017  . Cataracts, bilateral 07/11/2017  . Hypothyroidism, acquired 12/27/2014  . Iron deficiency anemia 12/27/2014  . Hiatal hernia 12/27/2014  . Varicose veins of lower extremities with other complications 95/63/8756   Recommendations/Plan:  DNR/DNI  Pain: Pain control is her top priority.  Been doing better with 10 mg dose of oxycodone.  I would recommend discharge home with oxycodone 10 mg every 3 hours as needed with medication to be further titrated by hospice.  Plan for discharge home with hospice support   Code Status:    Code Status Orders  (From admission, onward)         Start     Ordered   08/14/18 1750  Do not attempt resuscitation (DNR)  Continuous    Question Answer Comment  In the event of cardiac or respiratory ARREST Do not call a "code blue"   In the event of cardiac or respiratory ARREST Do not perform Intubation, CPR, defibrillation or ACLS   In the event of cardiac or respiratory ARREST Use medication by any route, position, wound care, and other measures to relive pain and suffering. May use oxygen, suction and manual treatment of airway obstruction as needed for comfort.      08/14/18 1749        Code Status History    Date Active Date Inactive Code Status Order ID Comments User Context   08/13/2018 2157  08/14/2018 1749 Full Code [id number]  Elwyn Reach, MD Inpatient   06/29/2018 2333 07/04/2018 2104 DNR [id number]  Schorr, Rhetta Mura, NP Inpatient   06/29/2018 2144 06/29/2018 2333 Full Code [id number]  Vianne Bulls, MD ED   02/23/2018 2113 03/18/2018 1832 Full Code [id number]  Merton Border, MD Inpatient   12/12/2017 2301 12/23/2017 1926 Full Code [id number]  Desiree Hane, MD Inpatient   11/28/2017 1307 12/08/2017 1808 Full Code [id number]  [name], MD Inpatient   08/29/2017 1810 09/04/2017 1946 Full Code [id number]  Samella Parr, NP Inpatient   07/18/2017 0341 07/29/2017 2238 Full Code [id number]  Danford, Suann Larry, MD Inpatient    Advance Directive Documentation     Most Recent Value  Type of Advance Directive  Healthcare Power of Attorney, Living will  Pre-existing out of facility DNR order (yellow form or pink MOST form)  -  "MOST" Form in Place?  -       Prognosis:   < 6 months  Discharge Planning:  Home with Hospice  Care plan was discussed with patient, RN  Thank you for allowing the Palliative Medicine Team to assist in the care of this patient.   Total Time 20 Prolonged Time Billed No      Greater than 50%  of this time was spent counseling and coordinating care related to the above assessment and plan.  Micheline Rough, MD  Please contact Palliative Medicine Team phone at  [phone] for questions and concerns.

## 2018-08-16 NOTE — Progress Notes (Addendum)
Notified by Dr Domingo Cocking, palliative, that patient will be ready for DC to home w home hospice w/in the next 24 hours. Dr Domingo Cocking stated patient interested in [facility]. LM requesting callback for hospital liaison listed in [place], Erling Conte, awaiting callback.  12:15 Spoke w Harmon Pier, notified of consult, CM will continue to follow.

## 2018-08-16 NOTE — Plan of Care (Signed)
Plan of care discussed with patient, and home aide that visited today

## 2018-08-16 NOTE — Discharge Summary (Signed)
Physician Discharge Summary  Shelby Reese KGM:[id number] DOB: 07/18/1935 DOA: 08/13/2018  PCP: [name], MD  Admit date: 08/13/2018 Discharge date: 08/16/2018  Admitted From: Home Disposition: Home with Hospice  Recommendations for Outpatient Follow-up:  1. Follow up care per Hospice Protocol   Home Health: No Equipment/Devices: None  Discharge Condition: Guarded  CODE STATUS: DO NOT RESUSCITATE Diet recommendation: Regular Diet   Brief/Interim Summary: HPI per Dr. Gala Romney on 08/13/18 Shelby Reese a 83 y.o.femalewith medical history significant ofperipheral vascular disease status post right AKA,left lower extremity venous ulcer which is infected being followed at the wound clinic, hypertension, hypothyroidism, morbid obesity and chronic kidney disease stage III who was sent over from the wound care center due to worsening pain and infected left lower extremity venous ulcer. Patient is complaining of 9 out of 10 pain. She was seen by Dr. Quentin Cornwall in the wound care center. She knows she may require below-knee amputation. She is having significant fever in addition. Also swollen leg with chronic lymphedema. Patient's also appeared infected. She is being admitted for treatment. Patient has gangrenous area in the anterior ankle wound.  ** Patient was has having significant pain so Palliative adjusting regimen.  Upon further review the case, Dr. Dellia Nims is not a orthopedic surgeon and he is in outpatient wound care specialist.  Vascular surgery was consulted for further evaluation recommendations and discussed the case with Dr. Monica Martinez who came to see the patient in consultation.  Patient is adamantly refusing surgery and is electing for hospice.  Palliative care involved for goals of discussion and patient has been changed to DNR and is to be discharged home with home hospice today.  Hospitalization has been complicated by anemia which is status post 2 units of  PRBCs.  Patient to be discharged home with hospice today and further care per hospice protocol.  Discharge Diagnoses:  Principal Problem:   Gangrene of left lower extremity due to atherosclerosis [facility]) Active Problems:   Hypothyroidism, acquired   Essential hypertension   Peripheral vascular disease (HCC)   GERD (gastroesophageal reflux disease)   Cellulitis of left lower extremity   Gangrene of right foot (HCC)  Pain and Gangrene of the Left Lower Extremity -Secondary to vascular ulcer. Infected,.  -Admitted patient and start on IV antibiotics.  -Dr. Dellia Nims is an outpatient Wound Specialist and does not follow patients in the hospital; -Most likely she might require amputation so Vascular Surgery was consulted for further evaluation and recommendations and patient adamantly refusing surgery and electing for Hospice  -[facility] Nurse for further evaluation and recommendations but did not evaluate the patient and per nurse "out of scope of practice" based on documentation -Patient was given Robaxin 5 mg p.o. once, tramadol 50 mg p.o. once, as well as IV morphine yesterday twice -Pain control with acetaminophen 650 mg p.o. every 6 PRN for mild pain, oxycodone 5-10 mg p.o. every 2 PRN for moderate pain, Oxycodone IR 10 mg q6h, as well as fentanyl 25 mcg IV every 1 as needed for severe pain; Palliative Adjusting Pain Regimen recommends oxycodone IR 10 mg every 3 PRN with hospice to take over -CRP was 13.3 and Sed Rate was 83 -WBC was 11.6 on admission and improved to 8.8 -Lactic acid level was normal -Continued with antibiotics as below but will discontinue as they offer very little benefit in patients focuses on comfort now and towards hospice -Palliative Care consulted for further evaluation and recommendations and Patient is now DNR/DNI; the goals  of care discussion with palliative care medicine Dr. Domingo Cocking and ultimately transition home with home hospice support -Patient's priority is pain  control and Dr. Domingo Cocking is currently adjusting her pain regimen has made recommendations for discharge -Appreciate palliative care medicine input -Further care per hospice protocol  Severe Peripheral Vascular Disease with history of right AKA -Advanced and well-known.  -Asked for Vascular Surgery input and they ultimately offered her an AKA but patient is adamant that she does not want any surgical intervention and would like to move forward with hospice which seems appropriate -Vascular Studies have been done in the past and attempted to repeat an ABI today but patient refused -Continue with aspirin 81 mg p.o. daily for now -Further care per hospice protocol  Cellulitis of the Left Lower Extremity -IV antibiotics have now been discontinued -Obtain an aerobic and anaerobic wound culture from the left leg wound -Patient was started on IV cefepime and added IV vancomycin because it is purulent and necrotic and will now stop as they will offer little benefit to patient's comfort -Vascular surgery consulted and recommending a left AKA given the extent of tissue loss and the fact that she is non-ambulatory -Further care per hospice protocol  GERD -Continue with home famotidine 20 mg p.o. daily  Essential Hypertension -Blood pressure elevated in the setting of pain and infection. Resume home medication with diltiazem 120 mg p.o. Daily -Blood pressure this afternoon was 152/58  Acute Kidney Injury on CKD stage II -Continued hydration with normal saline at 125 mL's per hour but will decrease rate to 100 mL/hr yesterday and will decrease rate to 75 mL's per hour today -Avoid nephrotoxic medications if possible and will stop ibuprofen 400 mg p.o. Daily -BUN/creatinine is stable at 25/1.20 -Continue monitor and trend renal function -Repeat CMP in the a.m.  Hypothyroidism -Check TSH and was 2.6 on 8 -Continue with Levothyroxine 175 mcg p.o. daily before breakfast  Seasonal  Allergies -Continue with Loratadine 10 mg daily along with diphenhydramine 25 mg p.o. 3 times daily  Normocytic Anemia -Likely in the setting of CKD -Patient's hemoglobin/hematocrit was 8.8/20.2 on admission is now 7.0/3.2 -Type and screen and transfused 2 units PRBCs and is now improved to 9.6/31.0 -Continue with ferrous sulfate 650 mill grams p.o. nightly -Continue to monitor for signs and symptoms of bleeding -Further care per hospice protocol  Discharge Instructions Discharge Instructions    Call MD for:  difficulty breathing, headache or visual disturbances   Complete by:  As directed    Call MD for:  extreme fatigue   Complete by:  As directed    Call MD for:  hives   Complete by:  As directed    Call MD for:  persistant dizziness or light-headedness   Complete by:  As directed    Call MD for:  persistant nausea and vomiting   Complete by:  As directed    Call MD for:  redness, tenderness, or signs of infection (pain, swelling, redness, odor or green/yellow discharge around incision site)   Complete by:  As directed    Call MD for:  severe uncontrolled pain   Complete by:  As directed    Call MD for:  temperature >100.4   Complete by:  As directed    Diet - low sodium heart healthy   Complete by:  As directed    Discharge instructions   Complete by:  As directed    Follow up Care per Hospice Protocol   Increase activity slowly  Complete by:  As directed      Allergies as of 08/16/2018      Reactions   Cinnamon Hives   Ciprofloxacin Other (See Comments)   TREMORS   Diovan [valsartan] Other (See Comments)   Extreme vertigo   Food Diarrhea, Other (See Comments)   ORANGE JUICE   UPSET STOMACH   Latex Rash, Other (See Comments)   Rash/inflammation due to exposure   Nitrofuran Derivatives Hives, Rash   "Full body rash"   Penicillins Hives, Swelling   *tolerated Ceftriaxone September 2018 Has patient had a PCN reaction causing immediate rash, facial/tongue/throat  swelling, SOB or lightheadedness with hypotension:No--severe irritation at the injection site Has patient had a PCN reaction causing severe rash involving mucus membranes or skin necrosis:Unknown Has patient had a PCN reaction that required hospitalization:No Has patient had a PCN reaction occurring within the last 10 years:Yes If all of the above answers are "NO", then may proceed with   Bactrim [sulfamethoxazole-trimethoprim] Diarrhea, Nausea Only   Other Rash   Mycins, Strawberry, Oranges   Sulfa Antibiotics Diarrhea, Nausea Only      Medication List    TAKE these medications   acetaminophen 325 MG tablet Commonly known as:  TYLENOL Take 2 tablets (650 mg total) by mouth every 6 (six) hours as needed for mild pain, fever or headache.   aspirin EC 81 MG tablet Take 81 mg by mouth every evening.   CALCIUM 600+D 600-400 MG-UNIT tablet Generic drug:  Calcium Carbonate-Vitamin D Take 1 tablet by mouth every evening.   cetirizine 10 MG tablet Commonly known as:  ZYRTEC Take 10 mg by mouth at bedtime.   diltiazem 120 MG 24 hr capsule Commonly known as:  TIAZAC HOLD until seen by your PCP What changed:    how much to take  how to take this  when to take this  additional instructions   diphenhydrAMINE 25 mg capsule Commonly known as:  BENADRYL Take 25 mg by mouth 3 (three) times daily.   famotidine 20 MG tablet Commonly known as:  PEPCID Take 2 tablets (40 mg total) by mouth 2 (two) times daily. What changed:    how much to take  when to take this   ferrous sulfate 325 (65 FE) MG tablet Take 487.5 mg by mouth every evening.   FIBER PO Take 1 capsule by mouth 2 (two) times daily.   gabapentin 100 MG capsule Commonly known as:  NEURONTIN Take 200 mg by mouth 2 (two) times daily.   GLUCOSAMINE PO Take 1 tablet by mouth daily.   ibuprofen 200 MG tablet Commonly known as:  ADVIL,MOTRIN Take 400 mg by mouth daily.   levothyroxine 175 MCG tablet Commonly  known as:  SYNTHROID, LEVOTHROID Take 175 mcg by mouth daily before breakfast.   Magnesium 125 MG Caps Take 125 mg by mouth every evening.   multivitamin with minerals tablet Take 1 tablet by mouth every evening.   ondansetron 4 MG tablet Commonly known as:  ZOFRAN Take 1 tablet (4 mg total) by mouth every 6 (six) hours as needed for nausea.   Oxycodone HCl 10 MG Tabs Take 1 tablet (10 mg total) by mouth every 3 (three) hours as needed for up to 3 days for severe pain.   vitamin C 500 MG tablet Commonly known as:  ASCORBIC ACID Take 500 mg by mouth every evening.      Follow-up Information    [name], MD Follow up.   Specialty:  Internal  Medicine Contact information: [address] [place] [address] [phone]          Allergies  Allergen Reactions  . Cinnamon Hives  . Ciprofloxacin Other (See Comments)    TREMORS  . Diovan [Valsartan] Other (See Comments)    Extreme vertigo  . Food Diarrhea and Other (See Comments)    ORANGE JUICE   UPSET STOMACH  . Latex Rash and Other (See Comments)    Rash/inflammation due to exposure  . Nitrofuran Derivatives Hives and Rash    "Full body rash"  . Penicillins Hives and Swelling    *tolerated Ceftriaxone September 2018 Has patient had a PCN reaction causing immediate rash, facial/tongue/throat swelling, SOB or lightheadedness with hypotension:No--severe irritation at the injection site Has patient had a PCN reaction causing severe rash involving mucus membranes or skin necrosis:Unknown Has patient had a PCN reaction that required hospitalization:No Has patient had a PCN reaction occurring within the last 10 years:Yes If all of the above answers are "NO", then may proceed with  . Bactrim [Sulfamethoxazole-Trimethoprim] Diarrhea and Nausea Only  . Other Rash    Mycins, Strawberry, Oranges  . Sulfa Antibiotics Diarrhea and Nausea Only   Consultations:  Palliative Care Medicine  Vascular  Surgery  Procedures/Studies: Dg Tibia/fibula Left  Result Date: 08/13/2018 CLINICAL DATA:  Left leg wound EXAM: LEFT TIBIA AND FIBULA - 2 VIEW COMPARISON:  None. FINDINGS: No acute bony abnormality. Specifically, no fracture, subluxation, or dislocation. No radiographic changes of osteomyelitis. Degenerative changes in the left knee and ankle. IMPRESSION: No acute bony abnormality. Electronically Signed   By: Rolm Baptise M.D.   On: 08/13/2018 18:16   Dg Foot Complete Left  Result Date: 08/13/2018 CLINICAL DATA:  Left foot and leg wound. EXAM: LEFT FOOT - COMPLETE 3+ VIEW COMPARISON:  06/29/2018 FINDINGS: Previously seen bone irregularity and lucency at the base of the left 5th metatarsal appears improved. Diffuse osteopenia. Decreasing soft tissue swelling within the foot. No visible radiographic changes of osteomyelitis currently. IMPRESSION: Improved appearance at the base of the left 5th metatarsal with decreasing lucency/irregularity. Osteopenia. Electronically Signed   By: Rolm Baptise M.D.   On: 08/13/2018 18:15    Subjective: Seen and examined at bedside and patient states that her main priority is pain control.  Does not want surgery at all and wants to go home with hospice.  Denies any other concerns or complaints at this time and will be discharged later today.  Discharge Exam: Vitals:   08/16/18 0538 08/16/18 1310  BP: (!) 165/71 (!) 152/58  Pulse: 79 78  Resp: 16 14  Temp: 98.1 F (36.7 C) 99.1 F (37.3 C)  SpO2: 95% 94%   Vitals:   08/15/18 2148 08/15/18 2335 08/16/18 0538 08/16/18 1310  BP: (!) 161/66 (!) 153/63 (!) 165/71 (!) 152/58  Pulse: 79 72 79 78  Resp: 18 16 16 14   Temp: 99.3 F (37.4 C) 98 F (36.7 C) 98.1 F (36.7 C) 99.1 F (37.3 C)  TempSrc: Oral Oral Oral Oral  SpO2: 95% 97% 95% 94%  Weight:      Height:       General: Pt is alert, awake, not in acute distress Cardiovascular: RRR, S1/S2 +, no rubs, no gallops Respiratory: Diminished bilaterally,  no wheezing, no rhonchi Abdominal: Soft, NT, slightly distended secondary body habitus, bowel sounds + Extremities: Right AKA and left leg with severe extremity leg wound number with necrosis  The results of significant diagnostics from this hospitalization (including  imaging, microbiology, ancillary and laboratory) are listed below for reference.    Microbiology: Recent Results (from the past 240 hour(s))  Blood culture (routine x 2)     Status: None (Preliminary result)   Collection Time: 08/13/18  5:41 PM  Result Value Ref Range Status   Specimen Description   Final    BLOOD RIGHT WRIST Performed at [facility], [place] [address] [place], [place] [address]    Special Requests   Final    BOTTLES DRAWN AEROBIC AND ANAEROBIC Blood Culture adequate volume Performed at [facility] [address], [place], [place] [address]    Culture   Final    NO GROWTH 3 DAYS Performed at [facility], [place] [address] [place], [place] [address]    Report Status PENDING  Incomplete  Blood culture (routine x 2)     Status: None (Preliminary result)   Collection Time: 08/13/18  5:41 PM  Result Value Ref Range Status   Specimen Description   Final    BLOOD LEFT ARM Performed at [facility] [address] [place], [place] [address]    Special Requests   Final    BOTTLES DRAWN AEROBIC AND ANAEROBIC Blood Culture results may not be optimal due to an inadequate volume of blood received in culture bottles Performed at [place] [address] [place], [place] [address]    Culture   Final    NO GROWTH 3 DAYS Performed at [facility], [place] [address], [place], [place] [address]    Report Status PENDING  Incomplete  Aerobic/Anaerobic Culture (surgical/deep wound)     Status: None (Preliminary result)   Collection Time: 08/14/18  9:19 AM  Result Value Ref Range Status   Specimen Description   Final    WOUND  LEFT LEG Performed at [facility] [address], [place], [place] [address]    Special Requests   Final    Normal Performed at [facility], [place] [address], [place], [place] [address]    Gram Stain   Final    FEW WBC PRESENT, PREDOMINANTLY PMN RARE GRAM POSITIVE COCCI Performed at [facility], [place] [address], [place], [place] [address]    Culture   Final    MODERATE STAPHYLOCOCCUS AUREUS MODERATE ENTEROCOCCUS FAECALIS FEW PSEUDOMONAS AERUGINOSA    Report Status PENDING  Incomplete  MRSA PCR Screening     Status: None   Collection Time: 08/14/18 10:15 AM  Result Value Ref Range Status   MRSA by PCR NEGATIVE NEGATIVE Final    Comment:        The GeneXpert MRSA Assay (FDA approved for NASAL specimens only), is one component of a comprehensive MRSA colonization surveillance program. It is not intended to diagnose MRSA infection nor to guide or monitor treatment for MRSA infections. Performed at [facility], [place] [address] [place],  [address]     Labs: BNP (last 3 results) Recent Labs    06/29/18 1719  BNP 02.7   Basic Metabolic Panel: Recent Labs  Lab 08/13/18 1725 08/14/18 0455 08/15/18 0403 08/16/18 0409  NA 136 138 139 139  K 4.5 5.0 4.7 4.5  CL 103 107 112* 111  CO2 23 22 22  21*  GLUCOSE 88 96 89 84  BUN 33* 33* 29* 25*  CREATININE 1.20* 1.28* 1.21* 1.20*  CALCIUM 9.6 9.1 8.7* 9.0  MG  --   --  1.9 1.8  PHOS  --   --  3.4 3.2   Liver Function Tests: Recent Labs  Lab 08/14/18 0455 08/15/18 0403 08/16/18 0409  AST 9* 10* 12*  ALT 8 8 8   ALKPHOS 48 42 44  BILITOT 0.5 0.5 0.5  PROT 6.3* 6.0* 6.4*  ALBUMIN 2.9* 2.6* 2.6*   No results for input(s): LIPASE, AMYLASE in the last 168 hours. No results for input(s): AMMONIA in the last 168 hours. CBC: Recent Labs  Lab 08/13/18 1725 08/14/18 0455 08/15/18 0403 08/16/18 0409  WBC 11.6* 9.5 8.9 8.8  NEUTROABS 8.7*  --  5.9  5.3  HGB 8.8* 7.8* 7.0* 9.6*  HCT 28.2* 25.1* 23.2* 31.0*  MCV 86.2 86.9 87.9 87.6  PLT 421* 379 345 327   Cardiac Enzymes: No results for input(s): CKTOTAL, CKMB, CKMBINDEX, TROPONINI in the last 168 hours. BNP: Invalid input(s): POCBNP CBG: No results for input(s): GLUCAP in the last 168 hours. D-Dimer No results for input(s): DDIMER in the last 72 hours. Hgb A1c No results for input(s): HGBA1C in the last 72 hours. Lipid Profile No results for input(s): CHOL, HDL, LDLCALC, TRIG, CHOLHDL, LDLDIRECT in the last 72 hours. Thyroid function studies Recent Labs    08/16/18 0409  TSH 2.618   Anemia work up No results for input(s): VITAMINB12, FOLATE, FERRITIN, TIBC, IRON, RETICCTPCT in the last 72 hours. Urinalysis    Component Value Date/Time   COLORURINE YELLOW 04/28/2018 2105   APPEARANCEUR CLEAR 04/28/2018 2105   LABSPEC 1.014 04/28/2018 2105   PHURINE 5.0 04/28/2018 2105   GLUCOSEU NEGATIVE 04/28/2018 2105   HGBUR NEGATIVE 04/28/2018 2105   BILIRUBINUR NEGATIVE 04/28/2018 2105   [place] NEGATIVE 04/28/2018 2105   PROTEINUR NEGATIVE 04/28/2018 2105   NITRITE NEGATIVE 04/28/2018 2105   LEUKOCYTESUR NEGATIVE 04/28/2018 2105   Sepsis Labs Invalid input(s): PROCALCITONIN,  WBC,  LACTICIDVEN Microbiology Recent Results (from the past 240 hour(s))  Blood culture (routine x 2)     Status: None (Preliminary result)   Collection Time: 08/13/18  5:41 PM  Result Value Ref Range Status   Specimen Description   Final    BLOOD RIGHT WRIST Performed at [facility], [place] [address], [place], [place] [address]    Special Requests   Final    BOTTLES DRAWN AEROBIC AND ANAEROBIC Blood Culture adequate volume Performed at [facility] [address], [place], [place] [address]    Culture   Final    NO GROWTH 3 DAYS Performed at [facility], [place] [address] [place], [place] [address]    Report Status PENDING  Incomplete  Blood  culture (routine x 2)     Status: None (Preliminary result)   Collection Time: 08/13/18  5:41 PM  Result Value Ref Range Status   Specimen Description   Final    BLOOD LEFT ARM Performed at [facility] [address] [place], [place] [address]    Special Requests   Final    BOTTLES DRAWN AEROBIC AND ANAEROBIC Blood Culture results may not be optimal due to an inadequate volume of blood received in culture bottles Performed at [facility] [address], [place], [place] [address]    Culture   Final    NO GROWTH 3 DAYS Performed at [facility], [place] [address] [place], [place] [address]    Report Status PENDING  Incomplete  Aerobic/Anaerobic Culture (surgical/deep wound)     Status: None (Preliminary  result)   Collection Time: 08/14/18  9:19 AM  Result Value Ref Range Status   Specimen Description   Final    WOUND LEFT LEG Performed at [facility] [address], [place], [place] [address]    Special Requests   Final    Normal Performed at [facility], [place] [address], [place], [place] [address]    Gram Stain   Final    FEW WBC PRESENT, PREDOMINANTLY PMN RARE GRAM POSITIVE COCCI Performed at [facility], [place] [address], [place], [place] [address]    Culture   Final    MODERATE STAPHYLOCOCCUS AUREUS MODERATE ENTEROCOCCUS FAECALIS FEW PSEUDOMONAS AERUGINOSA    Report Status PENDING  Incomplete  MRSA PCR Screening     Status: None   Collection Time: 08/14/18 10:15 AM  Result Value Ref Range Status   MRSA by PCR NEGATIVE NEGATIVE Final    Comment:        The GeneXpert MRSA Assay (FDA approved for NASAL specimens only), is one component of a comprehensive MRSA colonization surveillance program. It is not intended to diagnose MRSA infection nor to guide or monitor treatment for MRSA infections. Performed at [facility], [place] [address] [place], [place]  [address]    Time coordinating discharge: 35 minutes  SIGNED:  Kerney Elbe, DO [facility] 08/16/2018, 3:34 PM Pager is on [place]  If 7PM-7AM, please contact night-coverage [email or web address] Password [email or web address]

## 2018-08-16 NOTE — Progress Notes (Signed)
Dressing to left foot/leg changed. Wound very painful to touch. Base of wound red with some eschar noted to left ankle. Wound was cleansed and xeroform gauze applied. Wound covered with abd pad and secured with kerlix.

## 2018-08-16 NOTE — Discharge Instructions (Signed)
Someone from the Hospice Referral Center will call you on Monday and let you know when they will be coming out.

## 2018-08-16 NOTE — Progress Notes (Signed)
Discharged from floor via w/c for transport home by car. Belongings & spouse with pt. No changes in assessment. Shelby Reese  

## 2018-08-16 NOTE — Progress Notes (Addendum)
[facility] ° °Received call from RNCM Debbie for patient interest in hospice services at home after discharge. Will review, meet with patient and update Debbie when eligibility confirmed.  ° °Hospice eligibility confirmed by [facility] physician. Met with patient to review services. She confirms desire for hospice services at home after discharge. She declined offer to order DME. Reports she has everything she needs.  ° °[facility] referral center will follow up with patient by phone to schedule Admission Visit at home after discharge.  ° °Sent message to RNCM Debbie making her aware. Appreciate report from Dr. Freeman. ° °Thank you,  °Eva Davis, LCSW °[phone] °

## 2018-08-17 DIAGNOSIS — I10 Essential (primary) hypertension: Secondary | ICD-10-CM | POA: Diagnosis not present

## 2018-08-17 DIAGNOSIS — I70209 Unspecified atherosclerosis of native arteries of extremities, unspecified extremity: Secondary | ICD-10-CM | POA: Diagnosis not present

## 2018-08-17 DIAGNOSIS — E039 Hypothyroidism, unspecified: Secondary | ICD-10-CM | POA: Diagnosis not present

## 2018-08-17 DIAGNOSIS — K922 Gastrointestinal hemorrhage, unspecified: Secondary | ICD-10-CM | POA: Diagnosis not present

## 2018-08-17 DIAGNOSIS — I7090 Unspecified atherosclerosis: Secondary | ICD-10-CM | POA: Diagnosis not present

## 2018-08-17 DIAGNOSIS — I96 Gangrene, not elsewhere classified: Secondary | ICD-10-CM | POA: Diagnosis not present

## 2018-08-17 DIAGNOSIS — K219 Gastro-esophageal reflux disease without esophagitis: Secondary | ICD-10-CM | POA: Diagnosis not present

## 2018-08-17 DIAGNOSIS — E669 Obesity, unspecified: Secondary | ICD-10-CM | POA: Diagnosis not present

## 2018-08-17 DIAGNOSIS — J301 Allergic rhinitis due to pollen: Secondary | ICD-10-CM | POA: Diagnosis not present

## 2018-08-17 DIAGNOSIS — N183 Chronic kidney disease, stage 3 (moderate): Secondary | ICD-10-CM | POA: Diagnosis not present

## 2018-08-17 DIAGNOSIS — D500 Iron deficiency anemia secondary to blood loss (chronic): Secondary | ICD-10-CM | POA: Diagnosis not present

## 2018-08-17 DIAGNOSIS — F339 Major depressive disorder, recurrent, unspecified: Secondary | ICD-10-CM | POA: Diagnosis not present

## 2018-08-17 LAB — TYPE AND SCREEN
ABO/RH(D): A POS
Antibody Screen: NEGATIVE
Unit division: 0
Unit division: 0

## 2018-08-17 LAB — BPAM RBC
BLOOD PRODUCT EXPIRATION DATE: 201910232359
Blood Product Expiration Date: 201910232359
ISSUE DATE / TIME: 201909281241
ISSUE DATE / TIME: 201909282027
Unit Type and Rh: 6200
Unit Type and Rh: 6200

## 2018-08-18 DIAGNOSIS — I10 Essential (primary) hypertension: Secondary | ICD-10-CM | POA: Diagnosis not present

## 2018-08-18 DIAGNOSIS — D500 Iron deficiency anemia secondary to blood loss (chronic): Secondary | ICD-10-CM | POA: Diagnosis not present

## 2018-08-18 DIAGNOSIS — I70209 Unspecified atherosclerosis of native arteries of extremities, unspecified extremity: Secondary | ICD-10-CM | POA: Diagnosis not present

## 2018-08-18 DIAGNOSIS — J301 Allergic rhinitis due to pollen: Secondary | ICD-10-CM | POA: Diagnosis not present

## 2018-08-18 DIAGNOSIS — N183 Chronic kidney disease, stage 3 (moderate): Secondary | ICD-10-CM | POA: Diagnosis not present

## 2018-08-18 DIAGNOSIS — K922 Gastrointestinal hemorrhage, unspecified: Secondary | ICD-10-CM | POA: Diagnosis not present

## 2018-08-18 DIAGNOSIS — E039 Hypothyroidism, unspecified: Secondary | ICD-10-CM | POA: Diagnosis not present

## 2018-08-18 DIAGNOSIS — I96 Gangrene, not elsewhere classified: Secondary | ICD-10-CM | POA: Diagnosis not present

## 2018-08-18 DIAGNOSIS — I7090 Unspecified atherosclerosis: Secondary | ICD-10-CM | POA: Diagnosis not present

## 2018-08-18 DIAGNOSIS — K219 Gastro-esophageal reflux disease without esophagitis: Secondary | ICD-10-CM | POA: Diagnosis not present

## 2018-08-18 DIAGNOSIS — F339 Major depressive disorder, recurrent, unspecified: Secondary | ICD-10-CM | POA: Diagnosis not present

## 2018-08-18 DIAGNOSIS — E669 Obesity, unspecified: Secondary | ICD-10-CM | POA: Diagnosis not present

## 2018-08-18 LAB — CULTURE, BLOOD (ROUTINE X 2)
Culture: NO GROWTH
Culture: NO GROWTH
SPECIAL REQUESTS: ADEQUATE

## 2018-08-19 DIAGNOSIS — I96 Gangrene, not elsewhere classified: Secondary | ICD-10-CM | POA: Diagnosis not present

## 2018-08-19 DIAGNOSIS — D500 Iron deficiency anemia secondary to blood loss (chronic): Secondary | ICD-10-CM | POA: Diagnosis not present

## 2018-08-19 DIAGNOSIS — I7090 Unspecified atherosclerosis: Secondary | ICD-10-CM | POA: Diagnosis not present

## 2018-08-19 DIAGNOSIS — I70209 Unspecified atherosclerosis of native arteries of extremities, unspecified extremity: Secondary | ICD-10-CM | POA: Diagnosis not present

## 2018-08-19 DIAGNOSIS — I10 Essential (primary) hypertension: Secondary | ICD-10-CM | POA: Diagnosis not present

## 2018-08-19 DIAGNOSIS — K922 Gastrointestinal hemorrhage, unspecified: Secondary | ICD-10-CM | POA: Diagnosis not present

## 2018-08-19 LAB — AEROBIC/ANAEROBIC CULTURE (SURGICAL/DEEP WOUND): SPECIAL REQUESTS: NORMAL

## 2018-08-19 LAB — AEROBIC/ANAEROBIC CULTURE W GRAM STAIN (SURGICAL/DEEP WOUND)

## 2018-08-21 DIAGNOSIS — I7090 Unspecified atherosclerosis: Secondary | ICD-10-CM | POA: Diagnosis not present

## 2018-08-21 DIAGNOSIS — K922 Gastrointestinal hemorrhage, unspecified: Secondary | ICD-10-CM | POA: Diagnosis not present

## 2018-08-21 DIAGNOSIS — D500 Iron deficiency anemia secondary to blood loss (chronic): Secondary | ICD-10-CM | POA: Diagnosis not present

## 2018-08-21 DIAGNOSIS — I96 Gangrene, not elsewhere classified: Secondary | ICD-10-CM | POA: Diagnosis not present

## 2018-08-21 DIAGNOSIS — I10 Essential (primary) hypertension: Secondary | ICD-10-CM | POA: Diagnosis not present

## 2018-08-21 DIAGNOSIS — I70209 Unspecified atherosclerosis of native arteries of extremities, unspecified extremity: Secondary | ICD-10-CM | POA: Diagnosis not present

## 2018-08-23 DIAGNOSIS — D500 Iron deficiency anemia secondary to blood loss (chronic): Secondary | ICD-10-CM | POA: Diagnosis not present

## 2018-08-23 DIAGNOSIS — K922 Gastrointestinal hemorrhage, unspecified: Secondary | ICD-10-CM | POA: Diagnosis not present

## 2018-08-23 DIAGNOSIS — I10 Essential (primary) hypertension: Secondary | ICD-10-CM | POA: Diagnosis not present

## 2018-08-23 DIAGNOSIS — I96 Gangrene, not elsewhere classified: Secondary | ICD-10-CM | POA: Diagnosis not present

## 2018-08-23 DIAGNOSIS — I70209 Unspecified atherosclerosis of native arteries of extremities, unspecified extremity: Secondary | ICD-10-CM | POA: Diagnosis not present

## 2018-08-23 DIAGNOSIS — I7090 Unspecified atherosclerosis: Secondary | ICD-10-CM | POA: Diagnosis not present

## 2018-08-25 DIAGNOSIS — I70209 Unspecified atherosclerosis of native arteries of extremities, unspecified extremity: Secondary | ICD-10-CM | POA: Diagnosis not present

## 2018-08-25 DIAGNOSIS — I10 Essential (primary) hypertension: Secondary | ICD-10-CM | POA: Diagnosis not present

## 2018-08-25 DIAGNOSIS — K922 Gastrointestinal hemorrhage, unspecified: Secondary | ICD-10-CM | POA: Diagnosis not present

## 2018-08-25 DIAGNOSIS — D500 Iron deficiency anemia secondary to blood loss (chronic): Secondary | ICD-10-CM | POA: Diagnosis not present

## 2018-08-25 DIAGNOSIS — I96 Gangrene, not elsewhere classified: Secondary | ICD-10-CM | POA: Diagnosis not present

## 2018-08-25 DIAGNOSIS — I7090 Unspecified atherosclerosis: Secondary | ICD-10-CM | POA: Diagnosis not present

## 2018-08-27 ENCOUNTER — Encounter (HOSPITAL_COMMUNITY): Payer: Self-pay | Admitting: Emergency Medicine

## 2018-08-27 ENCOUNTER — Emergency Department (HOSPITAL_COMMUNITY)

## 2018-08-27 ENCOUNTER — Emergency Department (HOSPITAL_COMMUNITY)
Admission: EM | Admit: 2018-08-27 | Discharge: 2018-08-28 | Disposition: A | Discharge status: Home / Self Care | Attending: Emergency Medicine | Admitting: Emergency Medicine

## 2018-08-27 DIAGNOSIS — S299XXA Unspecified injury of thorax, initial encounter: Secondary | ICD-10-CM | POA: Diagnosis not present

## 2018-08-27 DIAGNOSIS — E039 Hypothyroidism, unspecified: Secondary | ICD-10-CM | POA: Insufficient documentation

## 2018-08-27 DIAGNOSIS — S79922A Unspecified injury of left thigh, initial encounter: Secondary | ICD-10-CM | POA: Diagnosis not present

## 2018-08-27 DIAGNOSIS — S81802S Unspecified open wound, left lower leg, sequela: Secondary | ICD-10-CM | POA: Diagnosis not present

## 2018-08-27 DIAGNOSIS — Z7982 Long term (current) use of aspirin: Secondary | ICD-10-CM | POA: Insufficient documentation

## 2018-08-27 DIAGNOSIS — N182 Chronic kidney disease, stage 2 (mild): Secondary | ICD-10-CM | POA: Diagnosis not present

## 2018-08-27 DIAGNOSIS — N3000 Acute cystitis without hematuria: Secondary | ICD-10-CM | POA: Insufficient documentation

## 2018-08-27 DIAGNOSIS — I129 Hypertensive chronic kidney disease with stage 1 through stage 4 chronic kidney disease, or unspecified chronic kidney disease: Secondary | ICD-10-CM | POA: Insufficient documentation

## 2018-08-27 DIAGNOSIS — R0902 Hypoxemia: Secondary | ICD-10-CM | POA: Diagnosis not present

## 2018-08-27 DIAGNOSIS — S3991XA Unspecified injury of abdomen, initial encounter: Secondary | ICD-10-CM | POA: Diagnosis not present

## 2018-08-27 DIAGNOSIS — F0390 Unspecified dementia without behavioral disturbance: Secondary | ICD-10-CM | POA: Diagnosis not present

## 2018-08-27 DIAGNOSIS — K922 Gastrointestinal hemorrhage, unspecified: Secondary | ICD-10-CM | POA: Diagnosis not present

## 2018-08-27 DIAGNOSIS — T1490XA Injury, unspecified, initial encounter: Secondary | ICD-10-CM

## 2018-08-27 DIAGNOSIS — R079 Chest pain, unspecified: Secondary | ICD-10-CM | POA: Diagnosis not present

## 2018-08-27 DIAGNOSIS — I7090 Unspecified atherosclerosis: Secondary | ICD-10-CM | POA: Diagnosis not present

## 2018-08-27 DIAGNOSIS — Z79899 Other long term (current) drug therapy: Secondary | ICD-10-CM | POA: Insufficient documentation

## 2018-08-27 DIAGNOSIS — S199XXA Unspecified injury of neck, initial encounter: Secondary | ICD-10-CM | POA: Diagnosis not present

## 2018-08-27 DIAGNOSIS — Z043 Encounter for examination and observation following other accident: Secondary | ICD-10-CM | POA: Diagnosis not present

## 2018-08-27 DIAGNOSIS — Z515 Encounter for palliative care: Secondary | ICD-10-CM

## 2018-08-27 DIAGNOSIS — I70209 Unspecified atherosclerosis of native arteries of extremities, unspecified extremity: Secondary | ICD-10-CM | POA: Diagnosis not present

## 2018-08-27 DIAGNOSIS — S3993XA Unspecified injury of pelvis, initial encounter: Secondary | ICD-10-CM | POA: Diagnosis not present

## 2018-08-27 DIAGNOSIS — R000 Tachycardia, unspecified: Secondary | ICD-10-CM | POA: Diagnosis not present

## 2018-08-27 DIAGNOSIS — W19XXXA Unspecified fall, initial encounter: Secondary | ICD-10-CM

## 2018-08-27 DIAGNOSIS — R109 Unspecified abdominal pain: Secondary | ICD-10-CM | POA: Insufficient documentation

## 2018-08-27 DIAGNOSIS — S3992XA Unspecified injury of lower back, initial encounter: Secondary | ICD-10-CM | POA: Diagnosis not present

## 2018-08-27 DIAGNOSIS — I96 Gangrene, not elsewhere classified: Secondary | ICD-10-CM | POA: Diagnosis not present

## 2018-08-27 DIAGNOSIS — S0990XA Unspecified injury of head, initial encounter: Secondary | ICD-10-CM | POA: Diagnosis not present

## 2018-08-27 DIAGNOSIS — D500 Iron deficiency anemia secondary to blood loss (chronic): Secondary | ICD-10-CM | POA: Diagnosis not present

## 2018-08-27 DIAGNOSIS — I10 Essential (primary) hypertension: Secondary | ICD-10-CM | POA: Diagnosis not present

## 2018-08-27 DIAGNOSIS — R404 Transient alteration of awareness: Secondary | ICD-10-CM | POA: Diagnosis not present

## 2018-08-27 LAB — COMPREHENSIVE METABOLIC PANEL
ALBUMIN: 3.2 g/dL — AB (ref 3.5–5.0)
ALT: 9 U/L (ref 0–44)
AST: 19 U/L (ref 15–41)
Alkaline Phosphatase: 53 U/L (ref 38–126)
Anion gap: 12 (ref 5–15)
BILIRUBIN TOTAL: 0.4 mg/dL (ref 0.3–1.2)
BUN: 15 mg/dL (ref 8–23)
CO2: 23 mmol/L (ref 22–32)
CREATININE: 1.24 mg/dL — AB (ref 0.44–1.00)
Calcium: 9.8 mg/dL (ref 8.9–10.3)
Chloride: 102 mmol/L (ref 98–111)
GFR calc Af Amer: 46 mL/min — ABNORMAL LOW (ref >60)
GFR, EST NON AFRICAN AMERICAN: 40 mL/min — AB (ref >60)
GLUCOSE: 132 mg/dL — AB (ref 70–99)
POTASSIUM: 4.3 mmol/L (ref 3.5–5.1)
Sodium: 137 mmol/L (ref 135–145)
TOTAL PROTEIN: 6.9 g/dL (ref 6.5–8.1)

## 2018-08-27 LAB — CBC WITH DIFFERENTIAL/PLATELET
Abs Immature Granulocytes: 0.07 10*3/uL (ref 0.00–0.07)
BASOS ABS: 0.1 10*3/uL (ref 0.0–0.1)
Basophils Relative: 0 %
EOS ABS: 0.2 10*3/uL (ref 0.0–0.5)
Eosinophils Relative: 1 %
HEMATOCRIT: 43.9 % (ref 36.0–46.0)
HEMOGLOBIN: 12.9 g/dL (ref 12.0–15.0)
IMMATURE GRANULOCYTES: 1 %
LYMPHS ABS: 1.2 10*3/uL (ref 0.7–4.0)
LYMPHS PCT: 9 %
MCH: 25.6 pg — ABNORMAL LOW (ref 26.0–34.0)
MCHC: 29.4 g/dL — ABNORMAL LOW (ref 30.0–36.0)
MCV: 87.1 fL (ref 80.0–100.0)
MONOS PCT: 5 %
Monocytes Absolute: 0.7 10*3/uL (ref 0.1–1.0)
NEUTROS PCT: 84 %
NRBC: 0 % (ref 0.0–0.2)
Neutro Abs: 10.9 10*3/uL — ABNORMAL HIGH (ref 1.7–7.7)
Platelets: 440 10*3/uL — ABNORMAL HIGH (ref 150–400)
RBC: 5.04 MIL/uL (ref 3.87–5.11)
RDW: 14.9 % (ref 11.5–15.5)
WBC: 13 10*3/uL — ABNORMAL HIGH (ref 4.0–10.5)

## 2018-08-27 LAB — CK: Total CK: 102 U/L (ref 38–234)

## 2018-08-27 LAB — I-STAT CHEM 8, ED
BUN: 19 mg/dL (ref 8–23)
CALCIUM ION: 1.24 mmol/L (ref 1.15–1.40)
CREATININE: 1.2 mg/dL — AB (ref 0.44–1.00)
Chloride: 102 mmol/L (ref 98–111)
Glucose, Bld: 124 mg/dL — ABNORMAL HIGH (ref 70–99)
HCT: 43 % (ref 36.0–46.0)
HEMOGLOBIN: 14.6 g/dL (ref 12.0–15.0)
POTASSIUM: 4.6 mmol/L (ref 3.5–5.1)
Sodium: 136 mmol/L (ref 135–145)
TCO2: 25 mmol/L (ref 22–32)

## 2018-08-27 LAB — CBG MONITORING, ED: GLUCOSE-CAPILLARY: 117 mg/dL — AB (ref 70–99)

## 2018-08-27 LAB — AMMONIA: AMMONIA: 18 umol/L (ref 9–35)

## 2018-08-27 LAB — I-STAT CG4 LACTIC ACID, ED: LACTIC ACID, VENOUS: 2.33 mmol/L — AB (ref 0.5–1.9)

## 2018-08-27 MED ORDER — IOHEXOL 300 MG/ML  SOLN
100.0000 mL | Freq: Once | INTRAMUSCULAR | Status: AC | PRN
  Administered 2018-08-27: 100 mL via INTRAVENOUS

## 2018-08-27 NOTE — Care Management (Signed)
ED CM received call from [facility]. Patient is active with home hospice,Patient lives at home with husband as primary care giver as per hospice nurse Trona RN. Patient sustained a fall at home,  It was reported recently patient has required increase in pain medication for comfort. Patient may need to be considered for residential hospice. ED evaluation still in progress.

## 2018-08-27 NOTE — ED Provider Notes (Addendum)
[facility] EMERGENCY DEPARTMENT Provider Note   CSN: [id number] Arrival date & time: 08/27/18  2108     History   Chief Complaint Chief Complaint  Patient presents with  . Fall    HPI Shelby Reese is a 83 y.o. female.  HPI   Patient is an 83 year old female with PMHx of PVD s/p right AKA, LLE venous ulcer followed by wound care, HTN, hypothyroidism, morbid obesity, CKD stage III, and dementia who presents from home via EMS s/p fall from bed this evening around 1800.  Husband states he heard a "boom" and found patient on the floor.  Patient alert to self only and states "stop poking me."  She c/o pain all over and is unable to isolate a or verbalize a specific complaint.  Husband believes she is slightly more confused than baseline.  No anticoagulation.  Patient has home hospice in place.  Per chart review patient was recently seen on 08/13/2018 for gangrene of LLE which was treated with IV ABX.  Seen by vascular and was offered amputation however patient adamantly declined and elected to pursue hospice care.  Did require 2 units PRBCs.  Patient is currently followed by palliative care.  CODE STATUS: DNR  History limited secondary to patient's mental stauts.  Past Medical History:  Diagnosis Date  . Allergy   . Anemia   . CKD (chronic kidney disease), stage II   . Fibromyalgia   . GERD (gastroesophageal reflux disease)   . Hiatal hernia   . Hypertension   . Hypothyroid   . Lymphedema    venous insufficency  . Osteoarthritis   . Peripheral vascular disease ([place])   . Ulcer of knee ([place])    right  . Urinary incontinence   . Varicose veins   . Venous insufficiency     Patient Active Problem List   Diagnosis Date Noted  . Gangrene of right foot ([place]) 08/13/2018  . Goals of care, counseling/discussion   . Palliative care by specialist   . Chronic pain of left lower extremity   . Gangrene of left lower extremity due to atherosclerosis ([place])   .  Moderate protein-calorie malnutrition ([place])   . Cellulitis of left lower extremity   . Symptomatic anemia 03/09/2018  . Fecal occult blood test positive   . Arterioloscleroses 03/02/2018  . Anemia 02/23/2018  . Foot ulcer ([place]) 02/23/2018  . Depression due to physical illness 02/18/2018  . Chronic ulcer of right heel with necrosis of bone ([place]) 12/27/2017  . Peripheral vascular disease of lower extremity with ulceration ([place]) 12/27/2017  . Acute renal failure superimposed on stage 3 chronic kidney disease ([place]) 12/27/2017  . GERD (gastroesophageal reflux disease) 12/27/2017  . CKD (chronic kidney disease), stage III ([place]) 08/29/2017  . Hypertension 08/29/2017  . Peripheral vascular disease ([place]) 08/29/2017  . Skin ulcer of knee, right, with fat layer exposed ([place]) 08/29/2017  . Pressure injury of skin 07/25/2017  . Wound, open, knee, lower leg, or ankle with complication, right, initial encounter   . Idiopathic chronic venous hypertension of both lower extremities with ulcer and inflammation ([place]) 07/15/2017  . Skin ulcer of right knee with necrosis of muscle ([place]) 07/15/2017  . Atherosclerosis of artery of right lower extremity ([place]) 07/11/2017  . Essential hypertension 07/11/2017  . Osteoporosis 07/11/2017  . Cataracts, bilateral 07/11/2017  . Hypothyroidism, acquired 12/27/2014  . Iron deficiency anemia 12/27/2014  . Hiatal hernia 12/27/2014  . Varicose veins of lower extremities with other complications  09/20/2013    Past Surgical History:  Procedure Laterality Date  . AMPUTATION Right 12/15/2017   Procedure: AMPUTATION ABOVE KNEE, RIGHT;  Surgeon: Elam Dutch, MD;  Location: [facility];  Service: Vascular;  Laterality: Right;  . APPLICATION OF WOUND VAC Right 07/18/2017   Procedure: APPLICATION OF WOUND VAC;  Surgeon: [name], MD;  Location: [facility];  Service: Orthopedics;  Laterality: Right;  . CATARACT EXTRACTION W/ INTRAOCULAR LENS  IMPLANT, BILATERAL    . COLONOSCOPY     . COLONOSCOPY WITH PROPOFOL N/A 03/12/2018   Procedure: COLONOSCOPY WITH PROPOFOL;  Surgeon: Clarene Essex, MD;  Location: [facility];  Service: Endoscopy;  Laterality: N/A;  . ESOPHAGOGASTRODUODENOSCOPY Left 03/09/2018   Procedure: ESOPHAGOGASTRODUODENOSCOPY (EGD);  Surgeon: Ronnette Juniper, MD;  Location: [facility];  Service: Gastroenterology;  Laterality: Left;  . I&D EXTREMITY Right 07/18/2017   Procedure: IRRIGATION AND DEBRIDEMENT RIGHT KNEE;  Surgeon: [name], MD;  Location: [facility];  Service: Orthopedics;  Laterality: Right;  . I&D EXTREMITY Right 07/23/2017   Procedure: REPEAT IRRIGATION AND DEBRIDEMENT RIGHT KNEE;  Surgeon: [name], MD;  Location: [facility];  Service: Orthopedics;  Laterality: Right;  . I&D EXTREMITY Right 11/28/2017   Procedure: IRRIGATION AND DEBRIDEMENT RIGHT KNEE , APPLY INSTILLATION VAC;  Surgeon: [name], MD;  Location: [facility];  Service: Orthopedics;  Laterality: Right;  . I&D EXTREMITY Right 12/03/2017   Procedure: REPEAT IRRIGATION AND DEBRIDEMENT RIGHT KNEE AND APPLICATION OF A WOUND VAC.;  Surgeon: [name], MD;  Location: [facility];  Service: Orthopedics;  Laterality: Right;  . KNEE ARTHROSCOPY Left    menisectomy  . LOWER EXTREMITY ANGIOGRAPHY N/A 03/03/2018   Procedure: LOWER EXTREMITY ANGIOGRAPHY;  Surgeon: Serafina Mitchell, MD;  Location: [facility];  Service: Cardiovascular;  Laterality: N/A;  . MULTIPLE TOOTH EXTRACTIONS    . PERIPHERAL VASCULAR ATHERECTOMY Left 03/03/2018   Procedure: PERIPHERAL VASCULAR ATHERECTOMY;  Surgeon: Serafina Mitchell, MD;  Location: [facility];  Service: Cardiovascular;  Laterality: Left;  common femoral and left SFA  . PERIPHERAL VASCULAR INTERVENTION  03/03/2018   Procedure: PERIPHERAL VASCULAR INTERVENTION;  Surgeon: Serafina Mitchell, MD;  Location: [facility];  Service: Cardiovascular;;  left SFA  . SKIN SPLIT GRAFT Right 07/25/2017   Procedure: Repeat Irrigation and Debridement Right  Knee, Split Thickness Skin Graft;  Surgeon: [name], MD;  Location: [facility];  Service: Orthopedics;  Laterality: Right;  . SKIN SPLIT GRAFT Right 12/05/2017   Procedure: SKIN GRAFT SPLIT THICKNESS WOUND KNEE, APPLY VAC;  Surgeon: [name], MD;  Location: [facility];  Service: Orthopedics;  Laterality: Right;  . TONSILLECTOMY       OB History   None      Home Medications    Prior to Admission medications   Medication Sig Start Date End Date Taking? Authorizing Provider  acetaminophen (TYLENOL) 325 MG tablet Take 2 tablets (650 mg total) by mouth every 6 (six) hours as needed for mild pain, fever or headache. 08/16/18   Raiford Noble Latif, DO  aspirin EC 81 MG tablet Take 81 mg by mouth every evening.     [provider]  Calcium Carbonate-Vitamin D (CALCIUM 600+D) 600-400 MG-UNIT per tablet Take 1 tablet by mouth every evening.     [provider]  cetirizine (ZYRTEC) 10 MG tablet Take 10 mg by mouth at bedtime.     [provider]  diltiazem (TIAZAC) 120 MG  24 hr capsule HOLD until seen by your PCP Patient taking differently: Take 120 mg by mouth daily.  03/18/18   Oretha Milch D, MD  diphenhydrAMINE (BENADRYL) 25 mg capsule Take 25 mg by mouth 3 (three) times daily.     [provider]  famotidine (PEPCID) 20 MG tablet Take 2 tablets (40 mg total) by mouth 2 (two) times daily. Patient taking differently: Take 20 mg by mouth daily.  07/04/18   Monica Becton, MD  ferrous sulfate 325 (65 FE) MG tablet Take 487.5 mg by mouth every evening.     [provider]  FIBER PO Take 1 capsule by mouth 2 (two) times daily.    [provider]  gabapentin (NEURONTIN) 100 MG capsule Take 200 mg by mouth 2 (two) times daily.     [provider]  Glucosamine HCl (GLUCOSAMINE PO) Take 1 tablet by mouth daily.    [provider]  ibuprofen (ADVIL,MOTRIN) 200 MG tablet Take 400 mg by mouth daily.    [provider]    levothyroxine (SYNTHROID, LEVOTHROID) 175 MCG tablet Take 175 mcg by mouth daily before breakfast.    [provider]  Magnesium 125 MG CAPS Take 125 mg by mouth every evening.     [provider]  Multiple Vitamins-Minerals (MULTIVITAMIN WITH MINERALS) tablet Take 1 tablet by mouth every evening.     [provider]  ondansetron (ZOFRAN) 4 MG tablet Take 1 tablet (4 mg total) by mouth every 6 (six) hours as needed for nausea. 08/16/18   Raiford Noble Latif, DO  vitamin C (ASCORBIC ACID) 500 MG tablet Take 500 mg by mouth every evening.     [provider]    Family History Family History  Problem Relation Age of Onset  . Stroke Mother   . Varicose Veins Mother   . Cancer Father        prostate  . Stroke Sister   . Heart disease Sister   . Varicose Veins Sister   . Stroke Maternal Grandmother   . Varicose Veins Sister     Social History Social History   Tobacco Use  . Smoking status: Former Smoker    Packs/day: 0.50    Years: 10.00    Pack years: 5.00    Last attempt to quit: 11/19/1979    Years since quitting: 38.8  . Smokeless tobacco: Never Used  Substance Use Topics  . Alcohol use: Yes    Alcohol/week: 14.0 standard drinks    Types: 14 Glasses of wine per week    Comment: 2 glasses of wine with dinner  . Drug use: No     Allergies   Cinnamon; Ciprofloxacin; Diovan [valsartan]; Food; Latex; Nitrofuran derivatives; Penicillins; Bactrim [sulfamethoxazole-trimethoprim]; Other; and Sulfa antibiotics   Review of Systems Review of Systems  Unable to perform ROS: Dementia     Physical Exam Updated Vital Signs BP 139/78   Pulse 99   Temp 98.1 F (36.7 C) (Oral)   Resp 15   Ht 5\' 5"  (1.651 m)   Wt 81.7 kg   SpO2 94%   BMI 29.97 kg/m   Physical Exam  Constitutional: She appears well-developed and well-nourished. No distress.  Dishelved chronically ill appearing elderly female.  HENT:  Head: Normocephalic and atraumatic.   Mouth/Throat: Oropharynx is clear and moist.  C collar placed.  Eyes: Pupils are equal, round, and reactive to light. Conjunctivae and EOM are normal.  Neck: Neck supple.  Cardiovascular: Normal rate and  regular rhythm.  No murmur heard. Pulmonary/Chest: Effort normal and breath sounds normal. No respiratory distress.  Abdominal: Soft. She exhibits no distension. There is tenderness (Left side). There is no guarding.  Ecchymosis in various stages of healing on lower abdomen.  Musculoskeletal: She exhibits no edema.  Right AKA.   LLE with pain upon palpation of thigh.  No open wounds or abrasions noted to thigh.  Limited hip flexion 2/2 effort vs pain.  Chronic gangrenous venous ulcer of distal leg that is wrapped.  Cap refill <2.  Edematous.    Neurological: She is alert.  Oriented to self only.  Able to move all 4 extremities spontaneously however limited with LLE.  Skin: Skin is warm and dry. Capillary refill takes less than 2 seconds.  Nursing note and vitals reviewed.    ED Treatments / Results  Labs (all labs ordered are listed, but only abnormal results are displayed) Labs Reviewed  CBC WITH DIFFERENTIAL/PLATELET - Abnormal; Notable for the following components:      Result Value   WBC 13.0 (*)    MCH 25.6 (*)    MCHC 29.4 (*)    Platelets 440 (*)    Neutro Abs 10.9 (*)    All other components within normal limits  COMPREHENSIVE METABOLIC PANEL - Abnormal; Notable for the following components:   Glucose, Bld 132 (*)    Creatinine, Ser 1.24 (*)    Albumin 3.2 (*)    GFR calc non Af Amer 40 (*)    GFR calc Af Amer 46 (*)    All other components within normal limits  CBG MONITORING, ED - Abnormal; Notable for the following components:   Glucose-Capillary 117 (*)    All other components within normal limits  I-STAT CHEM 8, ED - Abnormal; Notable for the following components:   Creatinine, Ser 1.20 (*)    Glucose, Bld 124 (*)    All other components within normal limits   I-STAT CG4 LACTIC ACID, ED - Abnormal; Notable for the following components:   Lactic Acid, Venous 2.33 (*)    All other components within normal limits  AMMONIA  CK  URINALYSIS, ROUTINE W REFLEX MICROSCOPIC    EKG EKG Interpretation  Date/Time:  Thursday August 27 2018 21:15:47 EDT Ventricular Rate:  100 PR Interval:    QRS Duration: 102 QT Interval:  351 QTC Calculation: 453 R Axis:   -31 Text Interpretation:  Sinus tachycardia Abnormal R-wave progression, early transition Left ventricular hypertrophy No acute changes No significant change since last tracing Confirmed by Varney Biles ([id number]) on 08/27/2018 10:47:34 PM   Radiology Ct Head Wo Contrast  Addendum Date: 08/27/2018   ADDENDUM REPORT: 08/27/2018 23:29 ADDENDUM: The left frontal sinus is opacified. Electronically Signed   By: Ulyses Jarred M.D.   On: 08/27/2018 23:29   Result Date: 08/27/2018 CLINICAL DATA:  Head trauma.  Fall EXAM: CT HEAD WITHOUT CONTRAST TECHNIQUE: Contiguous axial images were obtained from the base of the skull through the vertex without intravenous contrast. COMPARISON:  Head CT 11/12/2017 FINDINGS: Brain: There is no mass, hemorrhage or extra-axial collection. There is generalized atrophy without lobar predilection. There is no acute or chronic infarction. There is hypoattenuation of the periventricular white matter, most commonly indicating chronic ischemic microangiopathy. Vascular: Atherosclerotic calcification of the vertebral and internal carotid arteries at the skull base. No abnormal hyperdensity of the major intracranial arteries or dural venous sinuses. Skull: The visualized skull base, calvarium and extracranial soft tissues are normal. Sinuses/Orbits: No  fluid levels or advanced mucosal thickening of the visualized paranasal sinuses. No mastoid or middle ear effusion. The orbits are normal. IMPRESSION: 1. No acute intracranial abnormality. 2. Generalized atrophy and chronic small vessel  ischemia. Electronically Signed: By: Ulyses Jarred M.D. On: 08/27/2018 22:57   Ct Chest W Contrast  Result Date: 08/27/2018 CLINICAL DATA:  Patient presents after fall. Pain all over. Patient has been lying on the ground since 6 a.m. EXAM: CT CHEST, ABDOMEN, AND PELVIS WITH CONTRAST TECHNIQUE: Multidetector CT imaging of the chest, abdomen and pelvis was performed following the standard protocol during bolus administration of intravenous contrast. CONTRAST:  147mL OMNIPAQUE IOHEXOL 300 MG/ML  SOLN COMPARISON:  CXR 03/26/2018 FINDINGS: CT CHEST FINDINGS Cardiovascular: Conventional branch pattern of the great vessels with atherosclerosis. Atherosclerotic thoracic aorta without aneurysm or dissection. No mediastinal hematoma. Dilatation of the main pulmonary artery to 3.2 cm consistent with a component of chronic pulmonary hypertension. Borderline cardiomegaly. No pericardial effusion. Left main and three-vessel coronary arteriosclerosis. Mediastinum/Nodes: Hypodense left thyroid nodule measuring 13 mm in diameter. No mediastinal nor hilar lymphadenopathy. Trachea and mainstem bronchi are patent. Lungs/Pleura: Ground-glass opacities noted bilaterally likely representing areas of hypoventilatory change. Subsegmental atelectasis and/or scarring in the right middle lobe and lingula. No effusion or pneumothorax. 8 mm subpleural nodular density in the right lower lobe, series 4/77. Musculoskeletal: Osteoarthritis of both glenohumeral joints with bone-on-bone apposition. Spondylosis of the included cervical as well as thoracolumbar spine. CT ABDOMEN PELVIS FINDINGS Hepatobiliary: Moderate-to-marked gallbladder distention without mural thickening. This may be on the basis of a fasting state. No hepatic injury or mass. No intrahepatic ductal dilatation. Pancreas: Atrophic without inflammation, ductal dilatation or mass. Spleen: Circumscribed 15 x 10 mm hypodensity in the spleen likely representing a small acquired or  congenital cyst. Smaller hypodensity also noted more medially measuring 4 mm. The spleen overall appears normal in size. Adrenals/Urinary Tract: Normal bilateral adrenal glands. Mild cortical thinning of both kidneys. No enhancing mass, nephrolithiasis nor obstructive uropathy. Stomach/Bowel: Decompressed stomach. Normal small bowel rotation. No bowel obstruction or inflammation. Significant stool retention throughout the colon consistent constipation. The appendix is not definitively identified but no pericecal inflammation is seen. Vascular/Lymphatic: Moderate aortoiliac atherosclerosis. No adenopathy. Reproductive: Atrophic uterus consistent with age.  No adnexal mass. Other: No free air nor free fluid. Musculoskeletal: Grade 1 anterolisthesis of L4 on L5 and L5 on S1 without pars defects likely on the basis of degenerative disc disease. IMPRESSION: CT chest: 1. Dilatation of the main pulmonary artery compatible with chronic pulmonary hypertension. 2. Ground-glass opacities throughout both lungs likely representing areas of hypoventilatory change. Alveolitis or pneumonitis of believed less likely. 3. 8 mm nonspecific subpleural nodular density in the right lower lobe most likely represent post inflammatory infectious change. The possibility of a subpleural nodule is not entirely excluded. Non-contrast chest CT at 6-12 months is recommended. If the nodule is stable at time of repeat CT, then future CT at 18-24 months (from today's scan) is considered optional for low-risk patients, but is recommended for high-risk patients. This recommendation follows the consensus statement: Guidelines for Management of Incidental Pulmonary Nodules Detected on CT Images: From the Fleischner Society 2017; Radiology 2017; 284:228-243. CT AP: 1. Significant stool retention throughout the colon consistent constipation. No bowel obstruction or inflammation. 2. Moderate to marked gallbladder distention without secondary signs of acute  cholecystitis. This may simply reflect a fasting state. 3. 15 x 10 mm hypodensity in the spleen likely representing small congenital or acquired cyst. A smaller  4 mm hypodensity is also noted too small to further characterize but likely to represent a cyst as well. 4. Spondylolisthesis of L4 on L5 and L5 on S1. No acute osseous abnormality. Electronically Signed   By: Ashley Royalty M.D.   On: 08/27/2018 23:08   Ct Cervical Spine Wo Contrast  Result Date: 08/27/2018 CLINICAL DATA:  Fall EXAM: CT CERVICAL, THORACIC, AND LUMBAR SPINE WITHOUT CONTRAST TECHNIQUE: Multidetector CT imaging of the cervical, thoracic and lumbar spine was performed without intravenous contrast. Multiplanar CT image reconstructions were also generated. The thoracic and lumbar spine portions of the study were reconstructed from the data acquired during the CT of the chest, abdomen and pelvis with contrast. No additional contrast was administered. COMPARISON:  None. FINDINGS: CT CERVICAL SPINE FINDINGS Alignment: Grade 1 retrolisthesis at C4-5 and C5-6. Skull base and vertebrae: No acute fracture. No primary bone lesion or focal pathologic process. Soft tissues and spinal canal: No prevertebral fluid or swelling. No visible canal hematoma. Disc levels: No bony spinal canal stenosis or high-grade foraminal narrowing. Upper chest: Please see report for dedicated CT of the chest. CT THORACIC SPINE FINDINGS Alignment: There is dextroscoliosis of the thoracic spine. Vertebrae: No acute fracture. Paraspinal and other soft tissues: Please see dedicated report for CT of the chest. Disc levels: There is no bony spinal canal stenosis. No high-grade foraminal narrowing. No traumatic disc herniation. CT LUMBAR SPINE FINDINGS Segmentation: 5 lumbar type vertebrae. Alignment: There is grade 1 retrolisthesis at L2-3 and L3-4. Grade 1 anterolisthesis at L4-5 and L5-S1. Vertebrae: No acute fracture or erosive abnormality. Paraspinal and other soft tissues:  Please see dedicated report for CT of the abdomen and pelvis. Disc levels: There is narrowing of the intervertebral disc spaces at all lumbar levels, worst at L1-2, L2-3 and L5-S1. There is no high-grade spinal canal stenosis. Neural foraminal stenosis is worst at L5-S1. There is multilevel facet arthrosis. IMPRESSION: No acute fracture of the cervical, thoracic or lumbar spine. Electronically Signed   By: Ulyses Jarred M.D.   On: 08/27/2018 23:31   Ct Abdomen Pelvis W Contrast  Result Date: 08/27/2018 CLINICAL DATA:  Patient presents after fall. Pain all over. Patient has been lying on the ground since 6 a.m. EXAM: CT CHEST, ABDOMEN, AND PELVIS WITH CONTRAST TECHNIQUE: Multidetector CT imaging of the chest, abdomen and pelvis was performed following the standard protocol during bolus administration of intravenous contrast. CONTRAST:  165mL OMNIPAQUE IOHEXOL 300 MG/ML  SOLN COMPARISON:  CXR 03/26/2018 FINDINGS: CT CHEST FINDINGS Cardiovascular: Conventional branch pattern of the great vessels with atherosclerosis. Atherosclerotic thoracic aorta without aneurysm or dissection. No mediastinal hematoma. Dilatation of the main pulmonary artery to 3.2 cm consistent with a component of chronic pulmonary hypertension. Borderline cardiomegaly. No pericardial effusion. Left main and three-vessel coronary arteriosclerosis. Mediastinum/Nodes: Hypodense left thyroid nodule measuring 13 mm in diameter. No mediastinal nor hilar lymphadenopathy. Trachea and mainstem bronchi are patent. Lungs/Pleura: Ground-glass opacities noted bilaterally likely representing areas of hypoventilatory change. Subsegmental atelectasis and/or scarring in the right middle lobe and lingula. No effusion or pneumothorax. 8 mm subpleural nodular density in the right lower lobe, series 4/77. Musculoskeletal: Osteoarthritis of both glenohumeral joints with bone-on-bone apposition. Spondylosis of the included cervical as well as thoracolumbar spine. CT  ABDOMEN PELVIS FINDINGS Hepatobiliary: Moderate-to-marked gallbladder distention without mural thickening. This may be on the basis of a fasting state. No hepatic injury or mass. No intrahepatic ductal dilatation. Pancreas: Atrophic without inflammation, ductal dilatation or mass. Spleen: Circumscribed 15 x  10 mm hypodensity in the spleen likely representing a small acquired or congenital cyst. Smaller hypodensity also noted more medially measuring 4 mm. The spleen overall appears normal in size. Adrenals/Urinary Tract: Normal bilateral adrenal glands. Mild cortical thinning of both kidneys. No enhancing mass, nephrolithiasis nor obstructive uropathy. Stomach/Bowel: Decompressed stomach. Normal small bowel rotation. No bowel obstruction or inflammation. Significant stool retention throughout the colon consistent constipation. The appendix is not definitively identified but no pericecal inflammation is seen. Vascular/Lymphatic: Moderate aortoiliac atherosclerosis. No adenopathy. Reproductive: Atrophic uterus consistent with age.  No adnexal mass. Other: No free air nor free fluid. Musculoskeletal: Grade 1 anterolisthesis of L4 on L5 and L5 on S1 without pars defects likely on the basis of degenerative disc disease. IMPRESSION: CT chest: 1. Dilatation of the main pulmonary artery compatible with chronic pulmonary hypertension. 2. Ground-glass opacities throughout both lungs likely representing areas of hypoventilatory change. Alveolitis or pneumonitis of believed less likely. 3. 8 mm nonspecific subpleural nodular density in the right lower lobe most likely represent post inflammatory infectious change. The possibility of a subpleural nodule is not entirely excluded. Non-contrast chest CT at 6-12 months is recommended. If the nodule is stable at time of repeat CT, then future CT at 18-24 months (from today's scan) is considered optional for low-risk patients, but is recommended for high-risk patients. This  recommendation follows the consensus statement: Guidelines for Management of Incidental Pulmonary Nodules Detected on CT Images: From the Fleischner Society 2017; Radiology 2017; 284:228-243. CT AP: 1. Significant stool retention throughout the colon consistent constipation. No bowel obstruction or inflammation. 2. Moderate to marked gallbladder distention without secondary signs of acute cholecystitis. This may simply reflect a fasting state. 3. 15 x 10 mm hypodensity in the spleen likely representing small congenital or acquired cyst. A smaller 4 mm hypodensity is also noted too small to further characterize but likely to represent a cyst as well. 4. Spondylolisthesis of L4 on L5 and L5 on S1. No acute osseous abnormality. Electronically Signed   By: Ashley Royalty M.D.   On: 08/27/2018 23:08   Dg Pelvis Portable  Result Date: 08/27/2018 CLINICAL DATA:  [place] from bed tonight. EXAM: PORTABLE PELVIS 1-2 VIEWS COMPARISON:  None. FINDINGS: Pelvis and hips appear intact. No acute displaced fractures identified. SI joints and symphysis pubis are not displaced. Residual contrast material in the bladder. Vascular stent over the left hip. IMPRESSION: No acute bony abnormalities. Electronically Signed   By: Lucienne Capers M.D.   On: 08/27/2018 22:53   Ct T-spine No Charge  Result Date: 08/27/2018 CLINICAL DATA:  Fall EXAM: CT CERVICAL, THORACIC, AND LUMBAR SPINE WITHOUT CONTRAST TECHNIQUE: Multidetector CT imaging of the cervical, thoracic and lumbar spine was performed without intravenous contrast. Multiplanar CT image reconstructions were also generated. The thoracic and lumbar spine portions of the study were reconstructed from the data acquired during the CT of the chest, abdomen and pelvis with contrast. No additional contrast was administered. COMPARISON:  None. FINDINGS: CT CERVICAL SPINE FINDINGS Alignment: Grade 1 retrolisthesis at C4-5 and C5-6. Skull base and vertebrae: No acute fracture. No primary bone  lesion or focal pathologic process. Soft tissues and spinal canal: No prevertebral fluid or swelling. No visible canal hematoma. Disc levels: No bony spinal canal stenosis or high-grade foraminal narrowing. Upper chest: Please see report for dedicated CT of the chest. CT THORACIC SPINE FINDINGS Alignment: There is dextroscoliosis of the thoracic spine. Vertebrae: No acute fracture. Paraspinal and other soft tissues: Please see dedicated report for  CT of the chest. Disc levels: There is no bony spinal canal stenosis. No high-grade foraminal narrowing. No traumatic disc herniation. CT LUMBAR SPINE FINDINGS Segmentation: 5 lumbar type vertebrae. Alignment: There is grade 1 retrolisthesis at L2-3 and L3-4. Grade 1 anterolisthesis at L4-5 and L5-S1. Vertebrae: No acute fracture or erosive abnormality. Paraspinal and other soft tissues: Please see dedicated report for CT of the abdomen and pelvis. Disc levels: There is narrowing of the intervertebral disc spaces at all lumbar levels, worst at L1-2, L2-3 and L5-S1. There is no high-grade spinal canal stenosis. Neural foraminal stenosis is worst at L5-S1. There is multilevel facet arthrosis. IMPRESSION: No acute fracture of the cervical, thoracic or lumbar spine. Electronically Signed   By: Ulyses Jarred M.D.   On: 08/27/2018 23:31   Ct L-spine No Charge  Result Date: 08/27/2018 CLINICAL DATA:  Fall EXAM: CT CERVICAL, THORACIC, AND LUMBAR SPINE WITHOUT CONTRAST TECHNIQUE: Multidetector CT imaging of the cervical, thoracic and lumbar spine was performed without intravenous contrast. Multiplanar CT image reconstructions were also generated. The thoracic and lumbar spine portions of the study were reconstructed from the data acquired during the CT of the chest, abdomen and pelvis with contrast. No additional contrast was administered. COMPARISON:  None. FINDINGS: CT CERVICAL SPINE FINDINGS Alignment: Grade 1 retrolisthesis at C4-5 and C5-6. Skull base and vertebrae: No  acute fracture. No primary bone lesion or focal pathologic process. Soft tissues and spinal canal: No prevertebral fluid or swelling. No visible canal hematoma. Disc levels: No bony spinal canal stenosis or high-grade foraminal narrowing. Upper chest: Please see report for dedicated CT of the chest. CT THORACIC SPINE FINDINGS Alignment: There is dextroscoliosis of the thoracic spine. Vertebrae: No acute fracture. Paraspinal and other soft tissues: Please see dedicated report for CT of the chest. Disc levels: There is no bony spinal canal stenosis. No high-grade foraminal narrowing. No traumatic disc herniation. CT LUMBAR SPINE FINDINGS Segmentation: 5 lumbar type vertebrae. Alignment: There is grade 1 retrolisthesis at L2-3 and L3-4. Grade 1 anterolisthesis at L4-5 and L5-S1. Vertebrae: No acute fracture or erosive abnormality. Paraspinal and other soft tissues: Please see dedicated report for CT of the abdomen and pelvis. Disc levels: There is narrowing of the intervertebral disc spaces at all lumbar levels, worst at L1-2, L2-3 and L5-S1. There is no high-grade spinal canal stenosis. Neural foraminal stenosis is worst at L5-S1. There is multilevel facet arthrosis. IMPRESSION: No acute fracture of the cervical, thoracic or lumbar spine. Electronically Signed   By: Ulyses Jarred M.D.   On: 08/27/2018 23:31   Dg Chest Portable 1 View  Result Date: 08/27/2018 CLINICAL DATA:  [place] from bed tonight. EXAM: PORTABLE CHEST 1 VIEW COMPARISON:  03/26/2018 FINDINGS: Shallow inspiration. Heart size and pulmonary vascularity are normal. Peribronchial thickening and perihilar interstitial changes are similar to previous study, likely representing chronic bronchitis. No airspace disease or consolidation. No blunting of costophrenic angles. No pneumothorax. Mediastinal contours appear intact. Calcification of the aorta. IMPRESSION: Chronic bronchitic changes. No evidence of active pulmonary disease. Electronically Signed   By:  Lucienne Capers M.D.   On: 08/27/2018 22:57   Dg Femur Min 2 Views Left  Result Date: 08/27/2018 CLINICAL DATA:  [place] from bed tonight. EXAM: LEFT FEMUR 2 VIEWS COMPARISON:  Pelvis 08/27/2018 FINDINGS: Limited portable AP views of the femur obtained. No obvious displaced fracture identified. Degenerative changes in the left knee. Vascular calcifications and vascular stent present. No destructive or expansile bone lesions. IMPRESSION: No acute bony abnormalities  demonstrated on limited imaging. Electronically Signed   By: Lucienne Capers M.D.   On: 08/27/2018 22:58    Procedures Procedures (including critical care time)  Medications Ordered in ED Medications  aspirin EC tablet 81 mg (has no administration in time range)  levothyroxine (SYNTHROID, LEVOTHROID) tablet 175 mcg (has no administration in time range)  iohexol (OMNIPAQUE) 300 MG/ML solution 100 mL (100 mLs Intravenous Contrast Given 08/27/18 2152)     Initial Impression / Assessment and Plan / ED Course  I have reviewed the triage vital signs and the nursing notes.  Pertinent labs & imaging results that were available during my care of the patient were reviewed by me and considered in my medical decision making (see chart for details).   Patient is an 83 year old female with PMHx of PVD s/p right AKA, LLE venous ulcer followed by wound care, HTN, hypothyroidism, morbid obesity, CKD stage III, and dementia who presents from home via EMS s/p fall from bed this evening around 1800.  No anticoagulation. Home hospice in place.  On arrival HDS however appears uncomfortable.  Given exam as above and limited hx will obtain CT scans.  Labs significant for leukocytosis of 13 and lactic acid of 2.  Unclear source at this time.  UA pending.  Will hold on Abx as no fever.  CK, ammonia, and glc wnl.    CTH - No acute intracranial abnormality.  Generalized atrophy and chronic small vessel ischemia. CT C/T/L Spine - No acute fracture CT  C/A/P - No acute abnormalities.  Chronic pulmonary hypertension with groundglass opacities likely 2/2 hypoventilatory change.  Stool retention.  GB distension. Splenic cysts.  Spondylolisthesis of lumbar spine. CXR - No acute cardiopulmonary process. No PTX Pelvic XR - No acute bony abnormality. R Femur XR - No acute bony abnormality.  Incidental: 27mm Subpleural nodular density in the RLL  Given concern for safety at home will contact SW and Case Management in the AM for placement.   Per report patient and husband were recently discharged from ALF due to monetary reasons.  Will continue to observe over night, home medications ordered.  No acute injuries identified on work-up. Mild inflammatory marker elevation likely 2/2 to ongoing chronic gangrene of LLE however UA is still pending.  No indication for inpatient admission at this time.  She has been HDS throughout stay.  Patient care transferred to [facility] on 08/28/18 at 1201. UA is pending.  Plan is find placement after consult with SW and case management given concerns for safety at home.  Patient on hospice.  Please refer to their note for the remainder of ED care and ultimate disposition.  Final Clinical Impressions(s) / ED Diagnoses   Final diagnoses:  Fall    ED Discharge Orders    None           Fabian November, MD 08/28/18 [place], [place], MD 08/29/18 1600

## 2018-08-27 NOTE — ED Triage Notes (Signed)
BIB [facility] from home with c/o of fall. LOC unknown due to mental status and complaints of pain all over. Per EMS pt has been laying on the ground since 6am. Both pt and husband have hx of Alzheimer's and have no caretakers. Pt does have R BKA. And left foot thumb nail removed on arrival.

## 2018-08-28 DIAGNOSIS — I70209 Unspecified atherosclerosis of native arteries of extremities, unspecified extremity: Secondary | ICD-10-CM | POA: Diagnosis not present

## 2018-08-28 DIAGNOSIS — I7090 Unspecified atherosclerosis: Secondary | ICD-10-CM | POA: Diagnosis not present

## 2018-08-28 DIAGNOSIS — I96 Gangrene, not elsewhere classified: Secondary | ICD-10-CM | POA: Diagnosis not present

## 2018-08-28 DIAGNOSIS — D500 Iron deficiency anemia secondary to blood loss (chronic): Secondary | ICD-10-CM | POA: Diagnosis not present

## 2018-08-28 DIAGNOSIS — K922 Gastrointestinal hemorrhage, unspecified: Secondary | ICD-10-CM | POA: Diagnosis not present

## 2018-08-28 DIAGNOSIS — I10 Essential (primary) hypertension: Secondary | ICD-10-CM | POA: Diagnosis not present

## 2018-08-28 LAB — URINALYSIS, ROUTINE W REFLEX MICROSCOPIC
Bilirubin Urine: NEGATIVE
GLUCOSE, UA: NEGATIVE mg/dL
KETONES UR: NEGATIVE mg/dL
NITRITE: NEGATIVE
PH: 5 (ref 5.0–8.0)
Protein, ur: NEGATIVE mg/dL
SPECIFIC GRAVITY, URINE: 1.019 (ref 1.005–1.030)

## 2018-08-28 MED ORDER — LEVOTHYROXINE SODIUM 175 MCG PO TABS
175.0000 ug | ORAL_TABLET | Freq: Every day | ORAL | Status: DC
  Filled 2018-08-28: qty 1

## 2018-08-28 MED ORDER — OXYCODONE HCL 5 MG PO TABS
10.0000 mg | ORAL_TABLET | ORAL | Status: DC | PRN
  Administered 2018-08-28: 10 mg via ORAL
  Filled 2018-08-28: qty 2

## 2018-08-28 MED ORDER — DILTIAZEM HCL ER COATED BEADS 120 MG PO CP24
120.0000 mg | ORAL_CAPSULE | Freq: Every day | ORAL | Status: DC
  Filled 2018-08-28: qty 1

## 2018-08-28 MED ORDER — LORAZEPAM 2 MG/ML IJ SOLN
1.0000 mg | Freq: Once | INTRAMUSCULAR | Status: AC
  Administered 2018-08-28: 1 mg via INTRAVENOUS
  Filled 2018-08-28: qty 1

## 2018-08-28 MED ORDER — FAMOTIDINE 20 MG PO TABS: 20.0000 mg | ORAL_TABLET | Freq: Every day | ORAL | Status: DC

## 2018-08-28 MED ORDER — ASPIRIN EC 81 MG PO TBEC: 81.0000 mg | DELAYED_RELEASE_TABLET | Freq: Every evening | ORAL | Status: DC

## 2018-08-28 MED ORDER — FOSFOMYCIN TROMETHAMINE 3 G PO PACK
3.0000 g | PACK | Freq: Once | ORAL | Status: AC
  Administered 2018-08-28: 3 g via ORAL
  Filled 2018-08-28: qty 3

## 2018-08-28 NOTE — ED Notes (Addendum)
Patient received a bed at [facility] place Per hospice nurse send PT and they will change her wound.

## 2018-08-28 NOTE — Progress Notes (Addendum)
8:38am-CSW spoke with Shelby Reese [phone] and was informed that she and Shelby Reese (another Sales executive) will be to see pt shortly. Shelby Reese expressed that pt is from home with husband but husband and pt have been slowly declining. Shelby Reese expressed that a bed may be available for pt at [facility] today.   8:02am- CSW received call back [place] with Hospice and was informed that she would have another person follow up with CSW after they discuss it with Shelby Reese. CSW agreeable and will await call back at this time.   CSW consulted for possible Residential Hospice care for pt. CSW aware that pt is already set up with Hospice and [place] with a home hospice nurse. CSW attempted to speak with pt, husband,a dn care giver Shelby Reese) at bedside however not much information was given on the next level of care needs. CSW left message with representative at facility to have hospice nurse Antimony call CSW back. CSW will follow up once call has been received.    Shelby Reese, MSW, [place] [facility] Clinical Social Worker [phone]

## 2018-08-28 NOTE — Discharge Instructions (Addendum)
UTI was treated with Monurol in ER.  No further antibiotics needed for this.

## 2018-08-28 NOTE — ED Notes (Signed)
[facility] Reese [facility] to arrange transport for pt back to [facility].

## 2018-08-28 NOTE — ED Notes (Signed)
[facility] arrived to transport patient

## 2018-08-28 NOTE — Progress Notes (Signed)
CSW informed by pt's Hospice Social worker that pt has a bed at [facility] today. RN to call for [facility] transport at this time. There are no further CSW needs. CSW will sign off.     Virgie Dad. Ronin Rehfeldt, MSW, [place] [facility] Clinical Social Worker [phone]

## 2018-08-28 NOTE — ED Provider Notes (Addendum)
Hand off from previous ED PA [place] at shift change.  Briefly, patient is a 83 year old female with history of dementia, PVD, R aka, recent admission for gangrene to other leg declined amputation and wants to pursue hospice. DNR at bedside. Cannot afford nursing facility and was discharged back to home. Has a caregiver and hospice RN. Lives with husband also with dementia. Came in after fall at home with pain all over.    Physical Exam  BP (!) 149/72   Pulse (!) 104   Temp 98.1 F (36.7 C) (Oral)   Resp 15   Ht 5\' 5"  (1.651 m)   Wt 81.7 kg   SpO2 96%   BMI 29.97 kg/m   Physical Exam  Constitutional: She appears well-developed.  Eyes: EOM are normal.  Cardiovascular: Normal rate and regular rhythm.  Pulmonary/Chest: Effort normal and breath sounds normal.  Abdominal: Soft. There is no tenderness.  Musculoskeletal:  R AKA.  LLE with dressing from ankle to tibial tuberosity.  Skin above and below dressing is warm, pink without edema.  Pt can wiggle toes. No odor noted.   Neurological: She is alert.  Awake. Oriented to self. Can follow simple commands. Sensation to light touch intact in left toes. Speech is slow without aphasia or dysarthria.   Skin:  Wound to anterior LLE with necrotic tissue in the center, mild erythema circumferentially. No odor. No drainage. No significant edema or warmth.  Not significantly changes from picture on 9/27. See picture.       ED Course/Procedures   Clinical Course as of Aug 28 1032  Fri Aug 28, 2018  1740 Pulse Rate(!): 101 [CG]  0752 Lactic Acid, Venous(!!): 2.33 [CG]  0752 WBC(!): 13.0 [CG]  0752 Creatinine(!): 1.20 [CG]  0752 Creatinine(!): 1.24 [CG]  0753 Leukocytes, UA(!): LARGE [CG]  0753 WBC, UA: 21-50 [CG]  0753 Bacteria, UA(!): RARE [CG]  0753 WBC Clumps: PRESENT [CG]  0753 Non Squamous Epithelial(!): 0-5 [CG]  0753 No changes from prior   EKG 12-Lead [CG]  0754 3. 8 mm nonspecific subpleural nodular density in the right lower lobe  most likely represent post inflammatory infectious change. The possibility of a subpleural nodule is not entirely excluded. Non-contrast chest CT at 6-12 months is recommended.   [place] [CG]  0754 1. Significant stool retention throughout the colon consistent constipation. No bowel obstruction or inflammation. 2. Moderate to marked gallbladder distention without secondary signs of acute cholecystitis. This may simply reflect a fasting state. 3. 15 x 10 mm hypodensity in the spleen likely representing small congenital or acquired cyst. A smaller 4 mm hypodensity is also noted too small to further characterize but likely to represent a cyst as well. 4. Spondylolisthesis of L4 on L5 and L5 on S1. No acute osseous abnormality.  CT ABDOMEN PELVIS W CONTRAST [CG]    Clinical Course User Index [CG] Kinnie Feil, PA-C    Procedures  MDM   0700: At shift change, pending CM/SW to assist with placement out of ER likely to hospice facility. Noted mild leukocytosis, elevated lactic acid favored to be secondary to mild dehydration vs ongoing LLE venous ulcer and gangrene.  IV abx have been discontinued which could be causing inflammatory response.  No criteria to admit per primary ER team. Awaiting CM/SW.   0800: Spoke to patient, husband and caregiver Regino Schultze).  SW at bedside.  Pt denies pain.  She does not want to be "poked".  I have reviewed patient's work up,  remarkable as above.  SW unable to determine patient or husband's wishes given dementia.  Caregiver does not know status of care. Awaiting hospice RN call back. VSS.   1030: Pt has obtained bed at hospice at [facility] place.  Dressing to be changed at [facility] per hospice RN. Awaiting transport. VSS. Appropriate for discharge.     Kinnie Feil, PA-C 08/28/18 1033    Davonna Belling, MD 08/28/18 1546

## 2018-08-28 NOTE — Discharge Planning (Signed)
Pt currently active with [facility], ED TOC team contacted [place], RN of [facility] to access for availability in residential hospice.

## 2018-08-28 NOTE — ED Provider Notes (Signed)
7:14 AM Care assumed from Dr. Benjamine Mola at change of shift.  In short, patient is an 83 year old female currently on hospice with recent admission for gangrene to her left lower extremity.  She declined amputation and was initially treated with antibiotics, but antibiotics were weaned when patient decided to transfer to hospice care.  Coming to the ED tonight following a fall.  Patient signed out pending consultation with social work and care management in the ED.  She was noted to have a mild leukocytosis as well as a slight elevation of her lactic acid level.  I suspect that these are related to ongoing and untreated gangrene of the left leg.  The patient has been afebrile in the emergency department.  She does have evidence of pyuria on her urinalysis.  It is unclear whether this may represent infection, but patient was covered with a dose of Monurol.  Her daily medications have been ordered.  Patient signed out to Carmon Sails, PA-C at change of shift pending consultations.  May require placement in long-term hospice facility for end-of-life care.   Antonietta Breach, PA-C 08/28/18 1856    Ripley Fraise, MD 08/29/18 [phone]

## 2018-08-29 DIAGNOSIS — I10 Essential (primary) hypertension: Secondary | ICD-10-CM | POA: Diagnosis not present

## 2018-08-29 DIAGNOSIS — K922 Gastrointestinal hemorrhage, unspecified: Secondary | ICD-10-CM | POA: Diagnosis not present

## 2018-08-29 DIAGNOSIS — D500 Iron deficiency anemia secondary to blood loss (chronic): Secondary | ICD-10-CM | POA: Diagnosis not present

## 2018-08-29 DIAGNOSIS — I7090 Unspecified atherosclerosis: Secondary | ICD-10-CM | POA: Diagnosis not present

## 2018-08-29 DIAGNOSIS — I96 Gangrene, not elsewhere classified: Secondary | ICD-10-CM | POA: Diagnosis not present

## 2018-08-29 DIAGNOSIS — I70209 Unspecified atherosclerosis of native arteries of extremities, unspecified extremity: Secondary | ICD-10-CM | POA: Diagnosis not present

## 2018-08-30 DIAGNOSIS — I10 Essential (primary) hypertension: Secondary | ICD-10-CM | POA: Diagnosis not present

## 2018-08-30 DIAGNOSIS — D500 Iron deficiency anemia secondary to blood loss (chronic): Secondary | ICD-10-CM | POA: Diagnosis not present

## 2018-08-30 DIAGNOSIS — I7090 Unspecified atherosclerosis: Secondary | ICD-10-CM | POA: Diagnosis not present

## 2018-08-30 DIAGNOSIS — K922 Gastrointestinal hemorrhage, unspecified: Secondary | ICD-10-CM | POA: Diagnosis not present

## 2018-08-30 DIAGNOSIS — I96 Gangrene, not elsewhere classified: Secondary | ICD-10-CM | POA: Diagnosis not present

## 2018-08-30 DIAGNOSIS — I70209 Unspecified atherosclerosis of native arteries of extremities, unspecified extremity: Secondary | ICD-10-CM | POA: Diagnosis not present

## 2018-08-31 DIAGNOSIS — I10 Essential (primary) hypertension: Secondary | ICD-10-CM | POA: Diagnosis not present

## 2018-08-31 DIAGNOSIS — I7090 Unspecified atherosclerosis: Secondary | ICD-10-CM | POA: Diagnosis not present

## 2018-08-31 DIAGNOSIS — K922 Gastrointestinal hemorrhage, unspecified: Secondary | ICD-10-CM | POA: Diagnosis not present

## 2018-08-31 DIAGNOSIS — I70209 Unspecified atherosclerosis of native arteries of extremities, unspecified extremity: Secondary | ICD-10-CM | POA: Diagnosis not present

## 2018-08-31 DIAGNOSIS — I96 Gangrene, not elsewhere classified: Secondary | ICD-10-CM | POA: Diagnosis not present

## 2018-08-31 DIAGNOSIS — D500 Iron deficiency anemia secondary to blood loss (chronic): Secondary | ICD-10-CM | POA: Diagnosis not present

## 2018-09-01 DIAGNOSIS — I70209 Unspecified atherosclerosis of native arteries of extremities, unspecified extremity: Secondary | ICD-10-CM | POA: Diagnosis not present

## 2018-09-01 DIAGNOSIS — K922 Gastrointestinal hemorrhage, unspecified: Secondary | ICD-10-CM | POA: Diagnosis not present

## 2018-09-01 DIAGNOSIS — I10 Essential (primary) hypertension: Secondary | ICD-10-CM | POA: Diagnosis not present

## 2018-09-01 DIAGNOSIS — I7090 Unspecified atherosclerosis: Secondary | ICD-10-CM | POA: Diagnosis not present

## 2018-09-01 DIAGNOSIS — D500 Iron deficiency anemia secondary to blood loss (chronic): Secondary | ICD-10-CM | POA: Diagnosis not present

## 2018-09-01 DIAGNOSIS — I96 Gangrene, not elsewhere classified: Secondary | ICD-10-CM | POA: Diagnosis not present

## 2018-09-01 LAB — CARBAPENEM RESISTANCE PANEL
CARBA RESISTANCE IMP GENE: NOT DETECTED
CARBA RESISTANCE VIM GENE: NOT DETECTED
Carba Resistance KPC Gene: NOT DETECTED
Carba Resistance NDM Gene: NOT DETECTED
Carba Resistance OXA48 Gene: NOT DETECTED

## 2018-09-01 LAB — URINE CULTURE

## 2018-09-02 ENCOUNTER — Telehealth: Payer: Self-pay | Admitting: Emergency Medicine

## 2018-09-02 DIAGNOSIS — K922 Gastrointestinal hemorrhage, unspecified: Secondary | ICD-10-CM | POA: Diagnosis not present

## 2018-09-02 DIAGNOSIS — D500 Iron deficiency anemia secondary to blood loss (chronic): Secondary | ICD-10-CM | POA: Diagnosis not present

## 2018-09-02 DIAGNOSIS — I70209 Unspecified atherosclerosis of native arteries of extremities, unspecified extremity: Secondary | ICD-10-CM | POA: Diagnosis not present

## 2018-09-02 DIAGNOSIS — I7090 Unspecified atherosclerosis: Secondary | ICD-10-CM | POA: Diagnosis not present

## 2018-09-02 DIAGNOSIS — I96 Gangrene, not elsewhere classified: Secondary | ICD-10-CM | POA: Diagnosis not present

## 2018-09-02 DIAGNOSIS — I10 Essential (primary) hypertension: Secondary | ICD-10-CM | POA: Diagnosis not present

## 2018-09-02 NOTE — Telephone Encounter (Signed)
Post ED Visit - Positive Culture Follow-up  Culture report reviewed by antimicrobial stewardship pharmacist:  []  Elenor Quinones, Pharm.D. []  Heide Guile, Pharm.D., BCPS AQ-ID []  Parks Neptune, Pharm.D., BCPS []  Alycia Rossetti, Pharm.D., BCPS []  [place], Pharm.D., BCPS, AAHIVP []  Legrand Como, Pharm.D., BCPS, AAHIVP []  Salome Arnt, PharmD, BCPS []  Johnnette Gourd, PharmD, BCPS []  Hughes Better, PharmD, BCPS [x]  [name], PharmD  Positive urine culture  No further treatment. End of life care, already received fosfomycin.  Shelby Reese 09/02/2018, 11:08 AM

## 2018-09-02 NOTE — Progress Notes (Signed)
Late Entry for 08/28/18. Visit from 09:00am - 10:30am.  [facility] SW NOTE: RM B17 in the ED  LCSW completed routine visit at the [facility] Emergency Room. Pt was present in hospital bed along with Pt husband, Shelby Reese, paid care giver, Shelby Reese, and various other staff members including primary RN, [place], hospital RN liaison, [name], ED RN, [name], and ED PA, [name]. LCSW coordinated with ED SW, [name], via phone and Network engineer, [name], as well. Pt periodically cried out to God to help her and noted pain periodically as well. Discussion held regarding Pt plan of care goals and option to go home with 24/7 care through [facility], as noted by Shelby Reese, or go to [facility]. Pt notes desire for Last Rites to be completed and agreed to go to [facility]. Shelby Reese notes that RN never came to see Pt or husband for evaluation. LCSW inquired of [name] if priest available at hospital and there is not one to perform Last Rites for Pt. Pt husband, Shelby Reese, was agreeable to this and notes waiting on deacon [place] to come pick him up. Pt has cognitive issues but extent unknown. Pt husband able to provide income for himself and Pt for financial form and able to repeat back that fee will be $125/day at [facility]. Shelby Reese indicated to LCSW at previous visit that Pt husband cooks for Pt and shops for her as well as he drives. Shelby Reese notes that he can have ride from from [employer] if needed. LCSW completed Financial Eligibility Form and [place] Continued Stay and Eligibility Stay Criteria form. LCSW agreed to meet Pt husband at [facility] this afternoon. LCSW made Shelby Reese and Shelby Reese aware that Pt will only receive comfort care at [facility] though she received antibiotics last night. LCSW made hospital RN, SW, and secretary aware of plan to transfer Pt to [facility] as soon as possible. [facility] Chaplain, [name], arrived to offer support to Pt and family as LCSW was leaving.  Thank you,  [name], [place] [facility] [place]

## 2018-09-02 NOTE — Progress Notes (Signed)
ED Antimicrobial Stewardship Positive Culture Follow Up   Shelby Reese is an 83 y.o. female who presented to [facility] on 08/27/2018 with a chief complaint of  Chief Complaint  Patient presents with  . Fall    Recent Results (from the past 720 hour(s))  Aerobic Culture (superficial specimen)     Status: None   Collection Time: 08/05/18  1:50 PM  Result Value Ref Range Status   Specimen Description   Final    WOUND RIGHT LEG Performed at [facility] [address], [place], [place] [address]    Special Requests   Final    VENOUS STASIS ULCER Performed at [facility], [place] [address] [place], [place] [address]    Gram Stain   Final    NO WBC SEEN NO ORGANISMS SEEN Performed at [facility], [place] [address] [place], [place] [address]    Culture   Final    FEW METHICILLIN RESISTANT STAPHYLOCOCCUS AUREUS RARE PSEUDOMONAS AERUGINOSA    Report Status 08/08/2018 FINAL  Final   Organism ID, Bacteria METHICILLIN RESISTANT STAPHYLOCOCCUS AUREUS  Final   Organism ID, Bacteria PSEUDOMONAS AERUGINOSA  Final      Susceptibility   Methicillin resistant staphylococcus aureus - MIC*    CIPROFLOXACIN >=8 RESISTANT Resistant     ERYTHROMYCIN >=8 RESISTANT Resistant     GENTAMICIN <=0.5 SENSITIVE Sensitive     OXACILLIN >=4 RESISTANT Resistant     TETRACYCLINE <=1 SENSITIVE Sensitive     VANCOMYCIN <=0.5 SENSITIVE Sensitive     TRIMETH/SULFA >=320 RESISTANT Resistant     CLINDAMYCIN <=0.25 SENSITIVE Sensitive     RIFAMPIN <=0.5 SENSITIVE Sensitive     Inducible Clindamycin NEGATIVE Sensitive     * FEW METHICILLIN RESISTANT STAPHYLOCOCCUS AUREUS   Pseudomonas aeruginosa - MIC*    CEFTAZIDIME 4 SENSITIVE Sensitive     CIPROFLOXACIN 0.5 SENSITIVE Sensitive     GENTAMICIN <=1 SENSITIVE Sensitive     IMIPENEM 1 SENSITIVE Sensitive     PIP/TAZO 32 SENSITIVE Sensitive     CEFEPIME 8 SENSITIVE Sensitive     * RARE PSEUDOMONAS AERUGINOSA   Blood culture (routine x 2)     Status: None   Collection Time: 08/13/18  5:41 PM  Result Value Ref Range Status   Specimen Description   Final    BLOOD RIGHT WRIST Performed at [facility] [address] [place], [place] [address]    Special Requests   Final    BOTTLES DRAWN AEROBIC AND ANAEROBIC Blood Culture adequate volume Performed at [facility] [address], [place], [place] [address]    Culture   Final    NO GROWTH 5 DAYS Performed at [facility], [place] [address], [place], [place] [address]    Report Status 08/18/2018 FINAL  Final  Blood culture (routine x 2)     Status: None   Collection Time: 08/13/18  5:41 PM  Result Value Ref Range Status   Specimen Description   Final    BLOOD LEFT ARM Performed at [facility] [address], [place], [place] [address]    Special Requests   Final    BOTTLES DRAWN AEROBIC AND ANAEROBIC Blood Culture results may not be optimal due to an inadequate volume of blood received in culture bottles Performed at [facility] [address] [place], [place] [address]    Culture   Final    NO  GROWTH 5 DAYS Performed at [place] [facility], [place] [address], [place], [place] [address]    Report Status 08/18/2018 FINAL  Final  Aerobic/Anaerobic Culture (surgical/deep wound)     Status: None   Collection Time: 08/14/18  9:19 AM  Result Value Ref Range Status   Specimen Description   Final    WOUND LEFT LEG Performed at [facility] [address], [place], [place] [address]    Special Requests   Final    Normal Performed at [facility], [place] [address], [place], [place] [address]    Gram Stain   Final    FEW WBC PRESENT, PREDOMINANTLY PMN RARE GRAM POSITIVE COCCI Performed at [facility], [place] [address] [place], [place] [address]    Culture   Final    MODERATE METHICILLIN RESISTANT STAPHYLOCOCCUS  AUREUS MODERATE ENTEROCOCCUS FAECALIS FEW PSEUDOMONAS AERUGINOSA NO ANAEROBES ISOLATED; CULTURE IN PROGRESS FOR 5 DAYS    Report Status 08/19/2018 FINAL  Final   Organism ID, Bacteria METHICILLIN RESISTANT STAPHYLOCOCCUS AUREUS  Final   Organism ID, Bacteria ENTEROCOCCUS FAECALIS  Final   Organism ID, Bacteria PSEUDOMONAS AERUGINOSA  Final      Susceptibility   Enterococcus faecalis - MIC*    AMPICILLIN <=2 SENSITIVE Sensitive     VANCOMYCIN 1 SENSITIVE Sensitive     GENTAMICIN SYNERGY RESISTANT Resistant     * MODERATE ENTEROCOCCUS FAECALIS   Methicillin resistant staphylococcus aureus - MIC*    CIPROFLOXACIN >=8 RESISTANT Resistant     ERYTHROMYCIN >=8 RESISTANT Resistant     GENTAMICIN <=0.5 SENSITIVE Sensitive     OXACILLIN >=4 RESISTANT Resistant     TETRACYCLINE <=1 SENSITIVE Sensitive     VANCOMYCIN <=0.5 SENSITIVE Sensitive     TRIMETH/SULFA >=320 RESISTANT Resistant     CLINDAMYCIN <=0.25 SENSITIVE Sensitive     RIFAMPIN <=0.5 SENSITIVE Sensitive     Inducible Clindamycin NEGATIVE Sensitive     * MODERATE METHICILLIN RESISTANT STAPHYLOCOCCUS AUREUS   Pseudomonas aeruginosa - MIC*    CEFTAZIDIME 4 SENSITIVE Sensitive     CIPROFLOXACIN <=0.25 SENSITIVE Sensitive     GENTAMICIN 2 SENSITIVE Sensitive     IMIPENEM 2 SENSITIVE Sensitive     PIP/TAZO 32 SENSITIVE Sensitive     CEFEPIME 4 SENSITIVE Sensitive     * FEW PSEUDOMONAS AERUGINOSA  MRSA PCR Screening     Status: None   Collection Time: 08/14/18 10:15 AM  Result Value Ref Range Status   MRSA by PCR NEGATIVE NEGATIVE Final    Comment:        The GeneXpert MRSA Assay (FDA approved for NASAL specimens only), is one component of a comprehensive MRSA colonization surveillance program. It is not intended to diagnose MRSA infection nor to guide or monitor treatment for MRSA infections. Performed at [facility], [place] [address], [place], [place] [address]   Urine culture     Status: Abnormal    Collection Time: 08/28/18  5:24 AM  Result Value Ref Range Status   Specimen Description URINE, RANDOM  Final   Special Requests   Final    NONE Performed at [facility], [place] [address] [place],  [address]    Culture >=100,000 COLONIES/mL PSEUDOMONAS AERUGINOSA (A)  Final   Report Status 09/01/2018 FINAL  Final   Organism ID, Bacteria PSEUDOMONAS AERUGINOSA (A)  Final      Susceptibility   Pseudomonas aeruginosa - MIC*    CEFTAZIDIME 4 SENSITIVE  Sensitive     CIPROFLOXACIN 0.5 SENSITIVE Sensitive     GENTAMICIN <=1 SENSITIVE Sensitive     IMIPENEM >=16 RESISTANT Resistant     PIP/TAZO 32 SENSITIVE Sensitive     CEFEPIME 2 SENSITIVE Sensitive     * >=100,000 COLONIES/mL PSEUDOMONAS AERUGINOSA  Carbapenem Resistance Panel     Status: None   Collection Time: 08/28/18  5:24 AM  Result Value Ref Range Status   Carba Resistance IMP Gene NOT DETECTED NOT DETECTED Final   Carba Resistance VIM Gene NOT DETECTED NOT DETECTED Final   Carba Resistance NDM Gene NOT DETECTED NOT DETECTED Final   Carba Resistance KPC Gene NOT DETECTED NOT DETECTED Final   Carba Resistance OXA48 Gene NOT DETECTED NOT DETECTED Final    Comment: (NOTE) Cepheid Carba-R is an FDA-cleared nucleic acid amplification test  (NAAT)for the detection and differentiation of genes encoding the  most prevalent carbapenemases in bacterial isolate samples. Carbapenemase gene identification and implementation of comprehensive  infection control measures are recommended by the CDC to prevent the  spread of the resistant organisms. Performed at [facility], [place] [address] [place], [place] [address]     [x]  Patient discharged originally without antimicrobial agent  New antibiotic prescription: No further treatment recommended at this time.  ED Provider: Suella Broad, PA-C   [name] P 09/02/2018, 10:16 AM Clinical Pharmacist Monday - Friday phone -  [phone] Saturday - Sunday phone -  [phone]

## 2018-09-03 DIAGNOSIS — K922 Gastrointestinal hemorrhage, unspecified: Secondary | ICD-10-CM | POA: Diagnosis not present

## 2018-09-03 DIAGNOSIS — I7090 Unspecified atherosclerosis: Secondary | ICD-10-CM | POA: Diagnosis not present

## 2018-09-03 DIAGNOSIS — I10 Essential (primary) hypertension: Secondary | ICD-10-CM | POA: Diagnosis not present

## 2018-09-03 DIAGNOSIS — D500 Iron deficiency anemia secondary to blood loss (chronic): Secondary | ICD-10-CM | POA: Diagnosis not present

## 2018-09-03 DIAGNOSIS — I70209 Unspecified atherosclerosis of native arteries of extremities, unspecified extremity: Secondary | ICD-10-CM | POA: Diagnosis not present

## 2018-09-03 DIAGNOSIS — I96 Gangrene, not elsewhere classified: Secondary | ICD-10-CM | POA: Diagnosis not present

## 2018-09-18 DEATH — deceased

## 2018-10-08 IMAGING — DX DG TIBIA/FIBULA 2V*L*
3 series · 3 of 3 positions shown · non-contrast
Comparison: None.

CLINICAL DATA: Left leg wound

EXAM:
LEFT TIBIA AND FIBULA - 2 VIEW

[tibia ap]
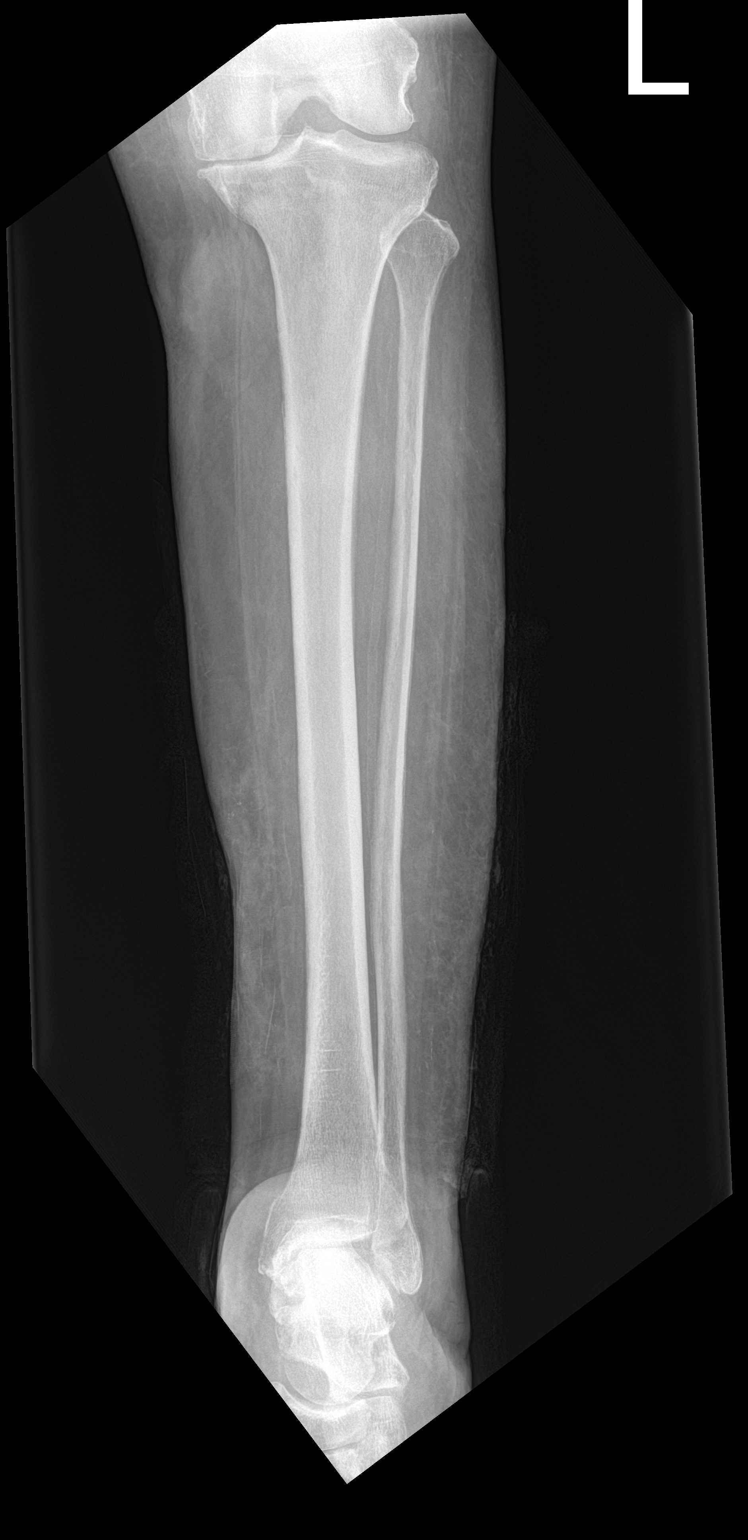

[tibia lat (1 of 2)]
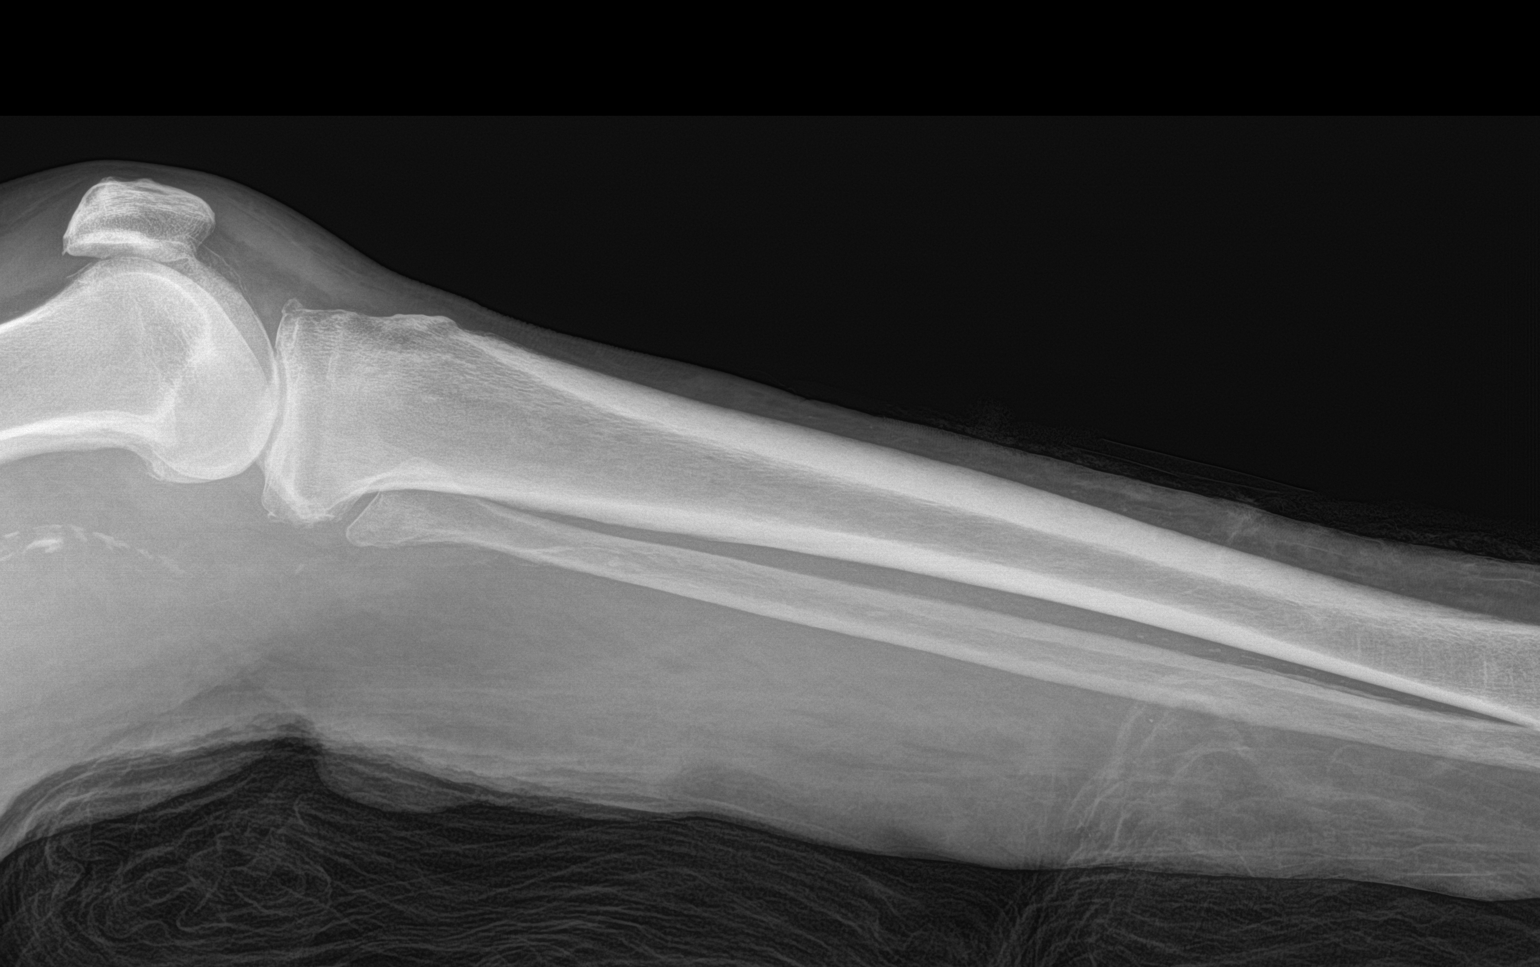

[tibia lat (2 of 2)]
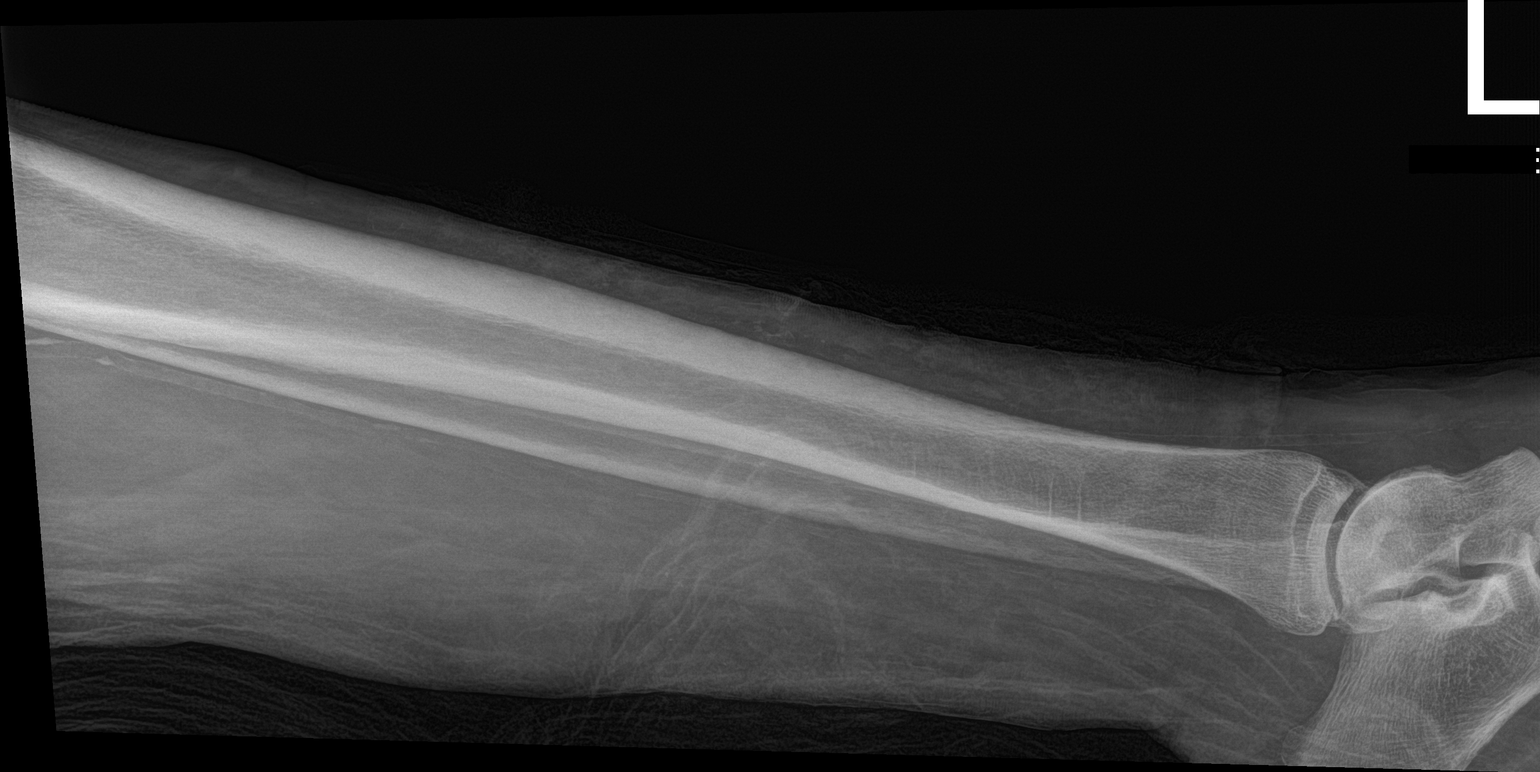

[3 of 3 positions shown; findings below may reference images not displayed]

FINDINGS: No acute bony abnormality. Specifically, no fracture, subluxation,
or dislocation. No radiographic changes of osteomyelitis.
Degenerative changes in the left knee and ankle.
IMPRESSION: No acute bony abnormality.

## 2018-10-08 IMAGING — DX DG FOOT COMPLETE 3+V*L*
3 series · 3 of 3 positions shown · non-contrast
Comparison: 06/29/2018

CLINICAL DATA: Left foot and leg wound.

EXAM:
LEFT FOOT - COMPLETE 3+ VIEW

[foot ap]
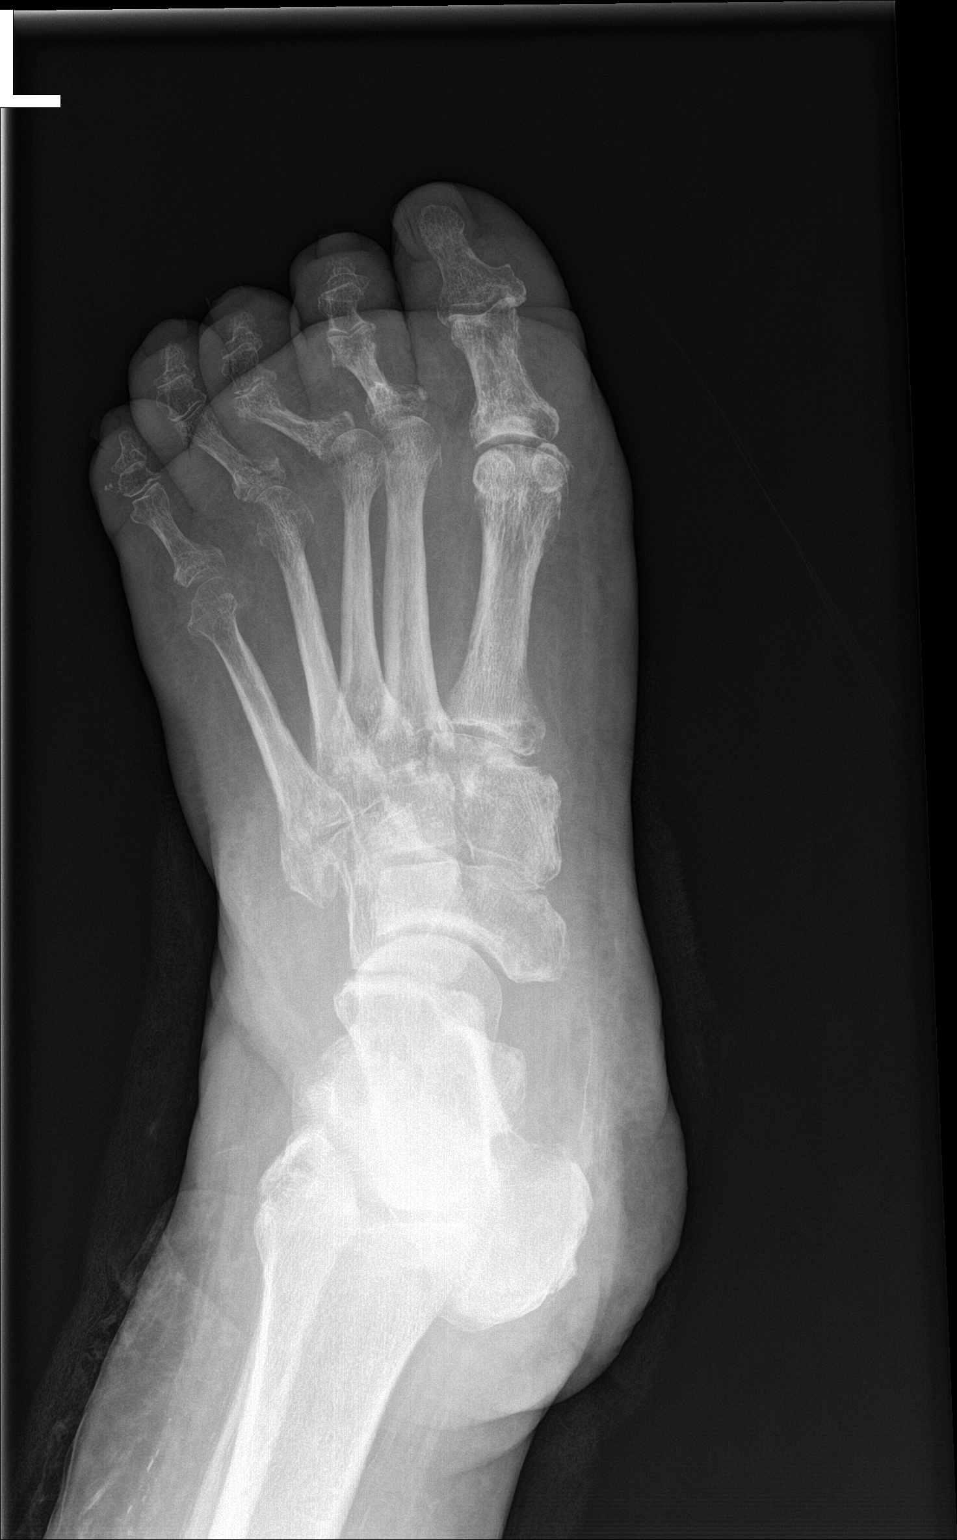

[foot obl]
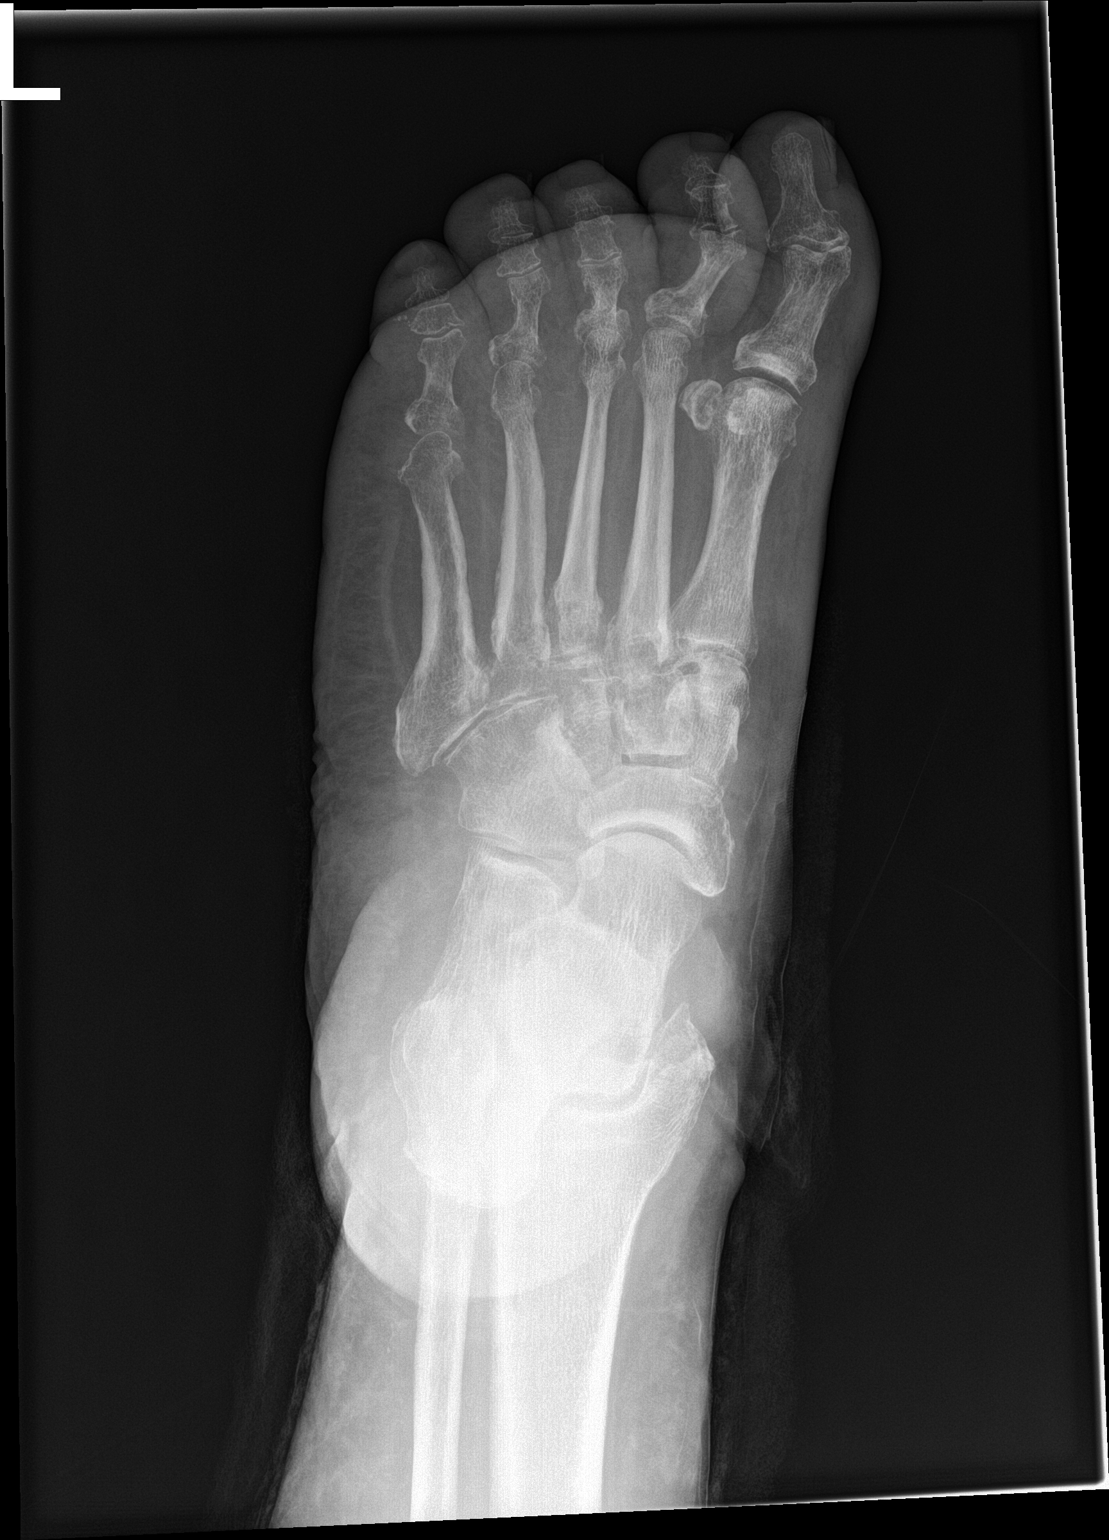

[foot lat]
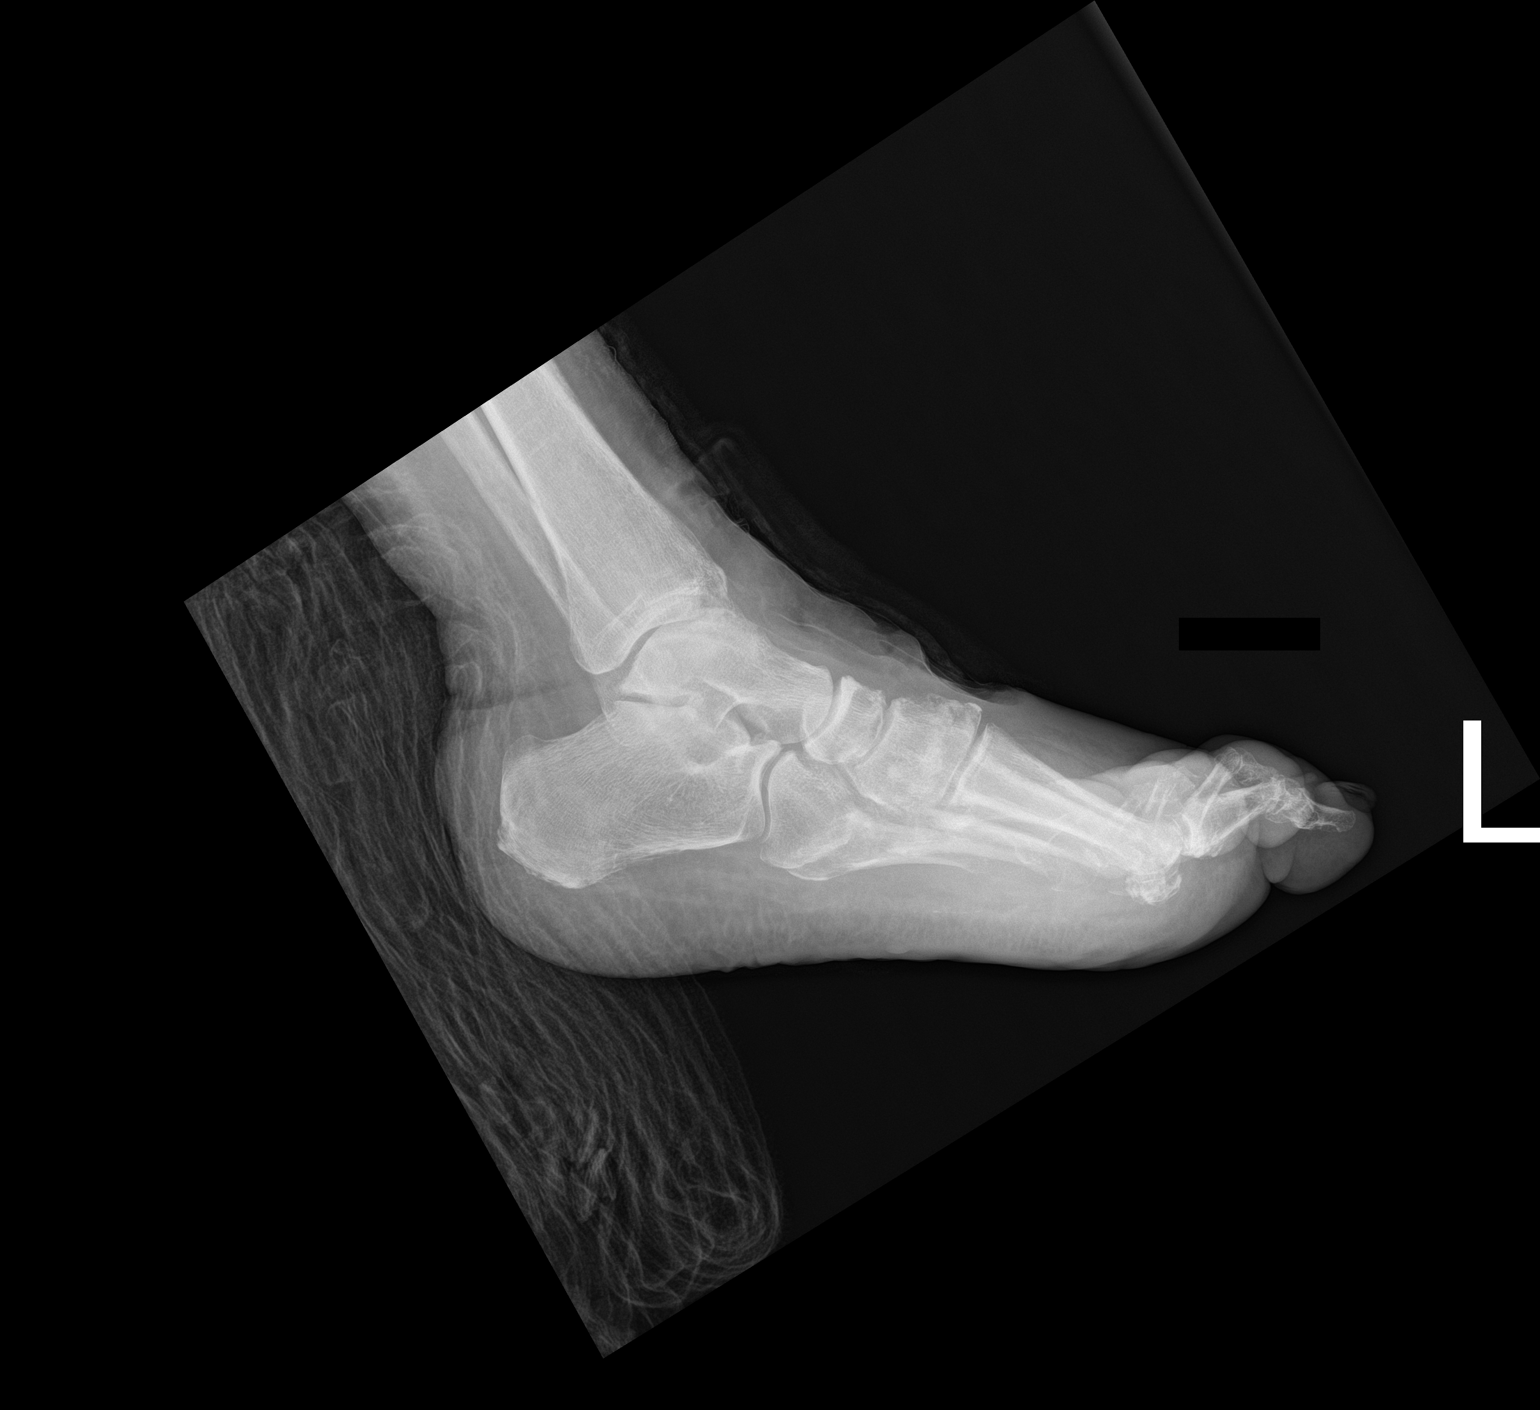

[3 of 3 positions shown; findings below may reference images not displayed]

FINDINGS: Previously seen bone irregularity and lucency at the base of the
left 5th metatarsal appears improved. Diffuse osteopenia. Decreasing
soft tissue swelling within the foot. No visible radiographic
changes of osteomyelitis currently.
IMPRESSION: Improved appearance at the base of the left 5th metatarsal with
decreasing lucency/irregularity.

Osteopenia.

## 2018-12-01 ENCOUNTER — Ambulatory Visit: Payer: Medicare Other | Admitting: Family

## 2018-12-01 ENCOUNTER — Encounter (HOSPITAL_COMMUNITY): Payer: Medicare Other

## 2018-12-19 DEATH — deceased

## 2019-03-31 ENCOUNTER — Other Ambulatory Visit: Payer: Self-pay | Admitting: *Deleted
